# Patient Record
Sex: Female | Born: 1955
Health system: Southern US, Community
[De-identification: ages and names within clinical notes are randomized; demographics above are authoritative.]

## PROBLEM LIST (undated history)

## (undated) DIAGNOSIS — I10 Essential (primary) hypertension: Secondary | ICD-10-CM

## (undated) DIAGNOSIS — J45909 Unspecified asthma, uncomplicated: Secondary | ICD-10-CM

## (undated) DIAGNOSIS — I639 Cerebral infarction, unspecified: Secondary | ICD-10-CM

## (undated) HISTORY — PX: OOPHORECTOMY: SHX86

## (undated) HISTORY — DX: Cerebral infarction, unspecified: I63.9

---

## 2003-02-17 HISTORY — PX: ABDOMINAL HYSTERECTOMY: SHX81

## 2003-02-17 HISTORY — PX: TOTAL ABDOMINAL HYSTERECTOMY W/ BILATERAL SALPINGOOPHORECTOMY: SHX83

## 2006-09-20 ENCOUNTER — Emergency Department: Payer: Self-pay | Admitting: Emergency Medicine

## 2006-09-22 ENCOUNTER — Ambulatory Visit: Payer: Self-pay | Admitting: Urology

## 2006-09-24 ENCOUNTER — Ambulatory Visit: Payer: Self-pay | Admitting: Urology

## 2006-10-19 ENCOUNTER — Emergency Department: Payer: Self-pay | Admitting: Emergency Medicine

## 2007-05-02 ENCOUNTER — Ambulatory Visit: Payer: Self-pay | Admitting: Urology

## 2007-06-28 ENCOUNTER — Ambulatory Visit: Payer: Self-pay | Admitting: Family Medicine

## 2007-09-19 ENCOUNTER — Emergency Department: Payer: Self-pay | Admitting: Internal Medicine

## 2010-12-03 ENCOUNTER — Ambulatory Visit: Payer: Self-pay

## 2012-07-14 ENCOUNTER — Emergency Department: Payer: Self-pay | Admitting: Emergency Medicine

## 2012-07-14 LAB — URINALYSIS, COMPLETE
Bacteria: NONE SEEN
Bilirubin,UR: NEGATIVE
Glucose,UR: NEGATIVE mg/dL (ref 0–75)
Ketone: NEGATIVE
Leukocyte Esterase: NEGATIVE
Nitrite: NEGATIVE
Ph: 7 (ref 4.5–8.0)
RBC,UR: <1 /HPF (ref 0–5)
Squamous Epithelial: 5
WBC UR: <1 /HPF (ref 0–5)

## 2012-07-14 LAB — COMPREHENSIVE METABOLIC PANEL
BUN: 12 mg/dL (ref 7–18)
Bilirubin,Total: 0.3 mg/dL (ref 0.2–1.0)
Chloride: 108 mmol/L — ABNORMAL HIGH (ref 98–107)
Creatinine: 0.94 mg/dL (ref 0.60–1.30)
EGFR (African American): >60
EGFR (Non-African Amer.): >60
Glucose: 106 mg/dL — ABNORMAL HIGH (ref 65–99)
Osmolality: 283 (ref 275–301)
SGOT(AST): 27 U/L (ref 15–37)
Total Protein: 6.9 g/dL (ref 6.4–8.2)

## 2012-07-14 LAB — CBC
HCT: 39.8 % (ref 35.0–47.0)
HGB: 13.4 g/dL (ref 12.0–16.0)
Platelet: 305 10*3/uL (ref 150–440)
RBC: 4.65 10*6/uL (ref 3.80–5.20)
RDW: 13.5 % (ref 11.5–14.5)

## 2012-07-14 LAB — LIPASE, BLOOD: Lipase: 165 U/L (ref 73–393)

## 2012-07-21 ENCOUNTER — Ambulatory Visit: Payer: Self-pay | Admitting: Family Medicine

## 2012-11-01 ENCOUNTER — Ambulatory Visit: Payer: Self-pay | Admitting: Family Medicine

## 2013-10-18 DIAGNOSIS — G4733 Obstructive sleep apnea (adult) (pediatric): Secondary | ICD-10-CM | POA: Insufficient documentation

## 2013-11-28 ENCOUNTER — Ambulatory Visit: Payer: Self-pay | Admitting: Internal Medicine

## 2014-06-27 ENCOUNTER — Other Ambulatory Visit: Payer: Self-pay | Admitting: Family Medicine

## 2014-06-27 DIAGNOSIS — Z139 Encounter for screening, unspecified: Secondary | ICD-10-CM

## 2015-07-02 ENCOUNTER — Other Ambulatory Visit: Payer: Self-pay | Admitting: Family Medicine

## 2015-07-02 DIAGNOSIS — Z139 Encounter for screening, unspecified: Secondary | ICD-10-CM

## 2016-03-11 ENCOUNTER — Other Ambulatory Visit: Payer: Self-pay | Admitting: Nurse Practitioner

## 2016-03-11 DIAGNOSIS — M25511 Pain in right shoulder: Secondary | ICD-10-CM

## 2016-03-17 ENCOUNTER — Ambulatory Visit: Payer: Self-pay

## 2016-10-22 ENCOUNTER — Emergency Department: Payer: Worker's Compensation

## 2016-10-22 ENCOUNTER — Emergency Department
Admission: EM | Admit: 2016-10-22 | Discharge: 2016-10-22 | Disposition: A | Discharge status: Home / Self Care | Payer: Worker's Compensation | Attending: Emergency Medicine | Admitting: Emergency Medicine

## 2016-10-22 ENCOUNTER — Encounter: Payer: Self-pay | Admitting: Emergency Medicine

## 2016-10-22 DIAGNOSIS — J45909 Unspecified asthma, uncomplicated: Secondary | ICD-10-CM | POA: Diagnosis not present

## 2016-10-22 DIAGNOSIS — S8002XA Contusion of left knee, initial encounter: Secondary | ICD-10-CM | POA: Diagnosis not present

## 2016-10-22 DIAGNOSIS — Y929 Unspecified place or not applicable: Secondary | ICD-10-CM | POA: Insufficient documentation

## 2016-10-22 DIAGNOSIS — W010XXA Fall on same level from slipping, tripping and stumbling without subsequent striking against object, initial encounter: Secondary | ICD-10-CM | POA: Insufficient documentation

## 2016-10-22 DIAGNOSIS — I10 Essential (primary) hypertension: Secondary | ICD-10-CM | POA: Diagnosis not present

## 2016-10-22 DIAGNOSIS — S8992XA Unspecified injury of left lower leg, initial encounter: Secondary | ICD-10-CM | POA: Diagnosis present

## 2016-10-22 DIAGNOSIS — Y939 Activity, unspecified: Secondary | ICD-10-CM | POA: Diagnosis not present

## 2016-10-22 DIAGNOSIS — Y990 Civilian activity done for income or pay: Secondary | ICD-10-CM | POA: Diagnosis not present

## 2016-10-22 DIAGNOSIS — Z79899 Other long term (current) drug therapy: Secondary | ICD-10-CM | POA: Insufficient documentation

## 2016-10-22 DIAGNOSIS — S8392XA Sprain of unspecified site of left knee, initial encounter: Secondary | ICD-10-CM | POA: Diagnosis not present

## 2016-10-22 HISTORY — DX: Unspecified asthma, uncomplicated: J45.909

## 2016-10-22 HISTORY — DX: Essential (primary) hypertension: I10

## 2016-10-22 MED ORDER — IBUPROFEN 800 MG PO TABS: 800.0000 mg | ORAL_TABLET | Freq: Three times a day (TID) | ORAL | 0 refills | Status: DC | PRN
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
## 2016-10-22 NOTE — ED Notes (Signed)
Pt taken to xray via wheelchair
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
## 2016-10-22 NOTE — Discharge Instructions (Signed)
Take medication as prescribed. Return to emergency department if symptoms worsen and follow-up with PCP as needed.    Utilize crutches and knee immobilizer as needed until symptoms improve. He may put as much weight through the left leg as he fell comfortable.

## 2017-01-15 ENCOUNTER — Ambulatory Visit (INDEPENDENT_AMBULATORY_CARE_PROVIDER_SITE_OTHER): Payer: BLUE CROSS/BLUE SHIELD | Admitting: Unknown Physician Specialty

## 2017-01-15 ENCOUNTER — Encounter: Payer: Self-pay | Admitting: Unknown Physician Specialty

## 2017-01-15 VITALS — BP 130/80 | HR 87 | Temp 98.4°F | Ht 62.6 in | Wt 240.5 lb

## 2017-01-15 DIAGNOSIS — Z1231 Encounter for screening mammogram for malignant neoplasm of breast: Secondary | ICD-10-CM | POA: Diagnosis not present

## 2017-01-15 DIAGNOSIS — J4540 Moderate persistent asthma, uncomplicated: Secondary | ICD-10-CM | POA: Diagnosis not present

## 2017-01-15 DIAGNOSIS — Z7689 Persons encountering health services in other specified circumstances: Secondary | ICD-10-CM | POA: Diagnosis not present

## 2017-01-15 DIAGNOSIS — I10 Essential (primary) hypertension: Secondary | ICD-10-CM | POA: Insufficient documentation

## 2017-01-15 DIAGNOSIS — J45909 Unspecified asthma, uncomplicated: Secondary | ICD-10-CM | POA: Insufficient documentation

## 2017-01-15 DIAGNOSIS — Z1239 Encounter for other screening for malignant neoplasm of breast: Secondary | ICD-10-CM

## 2017-01-15 NOTE — Assessment & Plan Note (Signed)
Stable, continue present medications.   

## 2017-01-15 NOTE — Progress Notes (Signed)
BP 130/80   Pulse 87   Temp 98.4 F (36.9 C) (Oral)   Ht 5' 2.6" (1.59 m)   Wt 240 lb 8 oz (109.1 kg)   SpO2 98%   BMI 43.15 kg/m    Subjective:    Patient ID: Kristin Solis Garlick, female    DOB: [birth year], 61 y.o.   MRN: [id number]  HPI: Kristin Solis Po is a 61 y.o. female  Chief Complaint  Patient presents with  . Establish Care    no conerns today    Hypertension Using medications without difficulty Average home BPs ; typically below 140/90   No problems or lightheadedness No chest pain with exertion or shortness of breath No Edema  ASTHMA Symptoms are well controlled.  Rescue inhaler use is rare Using medications without problems: Night time symptoms: none Wheeze/SOB: none  Weight loss Is down from 262 on her scale at home and is now down to 240.  Started purposeful weight loss 5 months ago by cutting out sweet tea and all over sugar.  Limits her bread.  Eats lower portions  Social History   Socioeconomic History  . Marital status: Married    Spouse name: Not on file  . Number of children: Not on file  . Years of education: Not on file  . Highest education level: Not on file  Social Needs  . Financial resource strain: Not on file  . Food insecurity - worry: Not on file  . Food insecurity - inability: Not on file  . Transportation needs - medical: Not on file  . Transportation needs - non-medical: Not on file  Occupational History  . Not on file  Tobacco Use  . Smoking status: Never Smoker  . Smokeless tobacco: Never Used  Substance and Sexual Activity  . Alcohol use: No  . Drug use: No  . Sexual activity: No  Other Topics Concern  . Not on file  Social History Narrative  . Not on file   Family History  Problem Relation Age of Onset  . Cancer Mother        lung  . Cancer Father        brain  . Diabetes Sister   . Alcohol abuse Brother   . Stroke Son        x2  . CAD Maternal Grandmother   . Emphysema Paternal Grandfather   . Heart  disease Sister        CHF   Past Medical History:  Diagnosis Date  . Asthma   . Hypertension    Past Surgical History:  Procedure Laterality Date  . ABDOMINAL HYSTERECTOMY  2005   total    Relevant past medical, surgical, family and social history reviewed and updated as indicated. Interim medical history since our last visit reviewed. Allergies and medications reviewed and updated.  Review of Systems  Constitutional: Negative.   HENT: Negative.   Eyes: Negative.   Respiratory: Negative.   Cardiovascular: Negative.   Gastrointestinal: Negative.   Endocrine: Negative.   Genitourinary: Negative.   Musculoskeletal: Negative.   Skin: Negative.   Allergic/Immunologic: Negative.   Neurological: Negative.   Hematological: Negative.   Psychiatric/Behavioral: Negative.     Per HPI unless specifically indicated above     Objective:    BP 130/80   Pulse 87   Temp 98.4 F (36.9 C) (Oral)   Ht 5' 2.6" (1.59 m)   Wt 240 lb 8 oz (109.1 kg)   SpO2 98%  BMI 43.15 kg/m   Wt Readings from Last 3 Encounters:  01/15/17 240 lb 8 oz (109.1 kg)  10/22/16 245 lb (111.1 kg)    Physical Exam  Constitutional: She is oriented to person, place, and time. She appears well-developed and well-nourished. No distress.  HENT:  Head: Normocephalic and atraumatic.  Eyes: Conjunctivae and lids are normal. Right eye exhibits no discharge. Left eye exhibits no discharge. No scleral icterus.  Neck: Normal range of motion. Neck supple. No JVD present. Carotid bruit is not present.  Cardiovascular: Normal rate, regular rhythm and normal heart sounds.  Pulmonary/Chest: Effort normal and breath sounds normal.  Abdominal: Normal appearance. There is no splenomegaly or hepatomegaly.  Musculoskeletal: Normal range of motion.  Neurological: She is alert and oriented to person, place, and time.  Skin: Skin is warm, dry and intact. No rash noted. No pallor.  Psychiatric: She has a normal mood and  affect. Her behavior is normal. Judgment and thought content normal.    No results found for this or any previous visit.    Assessment & Plan:   Problem List Items Addressed This Visit      Unprioritized   Asthma    Stable, continue present medications.        Relevant Medications   ADVAIR DISKUS 250-50 MCG/DOSE AEPB   albuterol (PROVENTIL HFA;VENTOLIN HFA) 108 (90 Base) MCG/ACT inhaler   Encounter to establish care   Essential hypertension, benign    Stable, continue present medications.         Other Visit Diagnoses    Breast cancer screening    -  Primary   Relevant Orders   MM DIGITAL SCREENING BILATERAL      Follow up plan: Return for schedule for PE.

## 2017-01-15 NOTE — Patient Instructions (Signed)
Please do call to schedule your mammogram; the number to schedule one at either [facility] or [facility] is [phone]   

## 2017-01-26 ENCOUNTER — Encounter: Payer: Self-pay | Admitting: Unknown Physician Specialty

## 2017-02-12 ENCOUNTER — Ambulatory Visit
Admission: RE | Admit: 2017-02-12 | Discharge: 2017-02-12 | Disposition: A | Discharge status: Home / Self Care | Payer: BLUE CROSS/BLUE SHIELD | Source: Ambulatory Visit | Attending: Unknown Physician Specialty | Admitting: Unknown Physician Specialty

## 2017-02-12 DIAGNOSIS — Z1231 Encounter for screening mammogram for malignant neoplasm of breast: Secondary | ICD-10-CM | POA: Insufficient documentation

## 2017-02-12 DIAGNOSIS — Z1239 Encounter for other screening for malignant neoplasm of breast: Secondary | ICD-10-CM

## 2017-02-17 ENCOUNTER — Other Ambulatory Visit: Payer: Self-pay | Admitting: Unknown Physician Specialty

## 2017-02-17 DIAGNOSIS — R928 Other abnormal and inconclusive findings on diagnostic imaging of breast: Secondary | ICD-10-CM

## 2017-02-17 DIAGNOSIS — N6489 Other specified disorders of breast: Secondary | ICD-10-CM

## 2017-03-01 ENCOUNTER — Other Ambulatory Visit: Payer: Self-pay

## 2017-03-01 ENCOUNTER — Ambulatory Visit: Payer: No Typology Code available for payment source

## 2017-03-05 ENCOUNTER — Ambulatory Visit
Admission: RE | Admit: 2017-03-05 | Discharge: 2017-03-05 | Disposition: A | Discharge status: Home / Self Care | Payer: BLUE CROSS/BLUE SHIELD | Source: Ambulatory Visit | Attending: Unknown Physician Specialty | Admitting: Unknown Physician Specialty

## 2017-03-05 ENCOUNTER — Encounter: Payer: BLUE CROSS/BLUE SHIELD | Admitting: Unknown Physician Specialty

## 2017-03-05 DIAGNOSIS — N6489 Other specified disorders of breast: Secondary | ICD-10-CM | POA: Insufficient documentation

## 2017-03-05 DIAGNOSIS — N6001 Solitary cyst of right breast: Secondary | ICD-10-CM | POA: Diagnosis not present

## 2017-03-05 DIAGNOSIS — R928 Other abnormal and inconclusive findings on diagnostic imaging of breast: Secondary | ICD-10-CM

## 2017-03-26 ENCOUNTER — Encounter: Payer: Self-pay | Admitting: Unknown Physician Specialty

## 2017-03-26 ENCOUNTER — Ambulatory Visit (INDEPENDENT_AMBULATORY_CARE_PROVIDER_SITE_OTHER): Payer: BLUE CROSS/BLUE SHIELD | Admitting: Unknown Physician Specialty

## 2017-03-26 VITALS — BP 137/76 | HR 80 | Temp 98.5°F | Wt 235.8 lb

## 2017-03-26 DIAGNOSIS — J4540 Moderate persistent asthma, uncomplicated: Secondary | ICD-10-CM

## 2017-03-26 DIAGNOSIS — N6001 Solitary cyst of right breast: Secondary | ICD-10-CM

## 2017-03-26 DIAGNOSIS — Z0000 Encounter for general adult medical examination without abnormal findings: Secondary | ICD-10-CM

## 2017-03-26 DIAGNOSIS — I10 Essential (primary) hypertension: Secondary | ICD-10-CM | POA: Diagnosis not present

## 2017-03-26 NOTE — Patient Instructions (Signed)
Call mammography in may  Please do call to schedule your mammogram; the number to schedule one at either [facility] or [facility] is [phone]

## 2017-03-26 NOTE — Progress Notes (Signed)
BP 137/76   Pulse 80   Temp 98.5 F (36.9 C) (Oral)   Wt 235 lb 12.8 oz (107 kg)   SpO2 98%   BMI 42.31 kg/m    Subjective:    Patient ID: Kristin Solis, female    DOB: March 14, 1955, 62 y.o.   MRN: [id number]  HPI: Kristin Solis is a 62 y.o. female  Chief Complaint  Patient presents with  . Annual Exam   ASTHMA Triggers: None Symptoms are well controlled Using medications without problems Night time symptoms: None Wheeze/SOB:None Takes inhaler rarely  Obesity She has lost 5 pounds since last seen and cut back on bread.  Still drinking green tea.    Hypertension Using medications without difficulty Average home BPs: Not checking   No problems or lightheadedness No chest pain with exertion or shortness of breath No Edema   Social History   Socioeconomic History  . Marital status: Married    Spouse name: Not on file  . Number of children: Not on file  . Years of education: Not on file  . Highest education level: Not on file  Social Needs  . Financial resource strain: Not on file  . Food insecurity - worry: Not on file  . Food insecurity - inability: Not on file  . Transportation needs - medical: Not on file  . Transportation needs - non-medical: Not on file  Occupational History  . Not on file  Tobacco Use  . Smoking status: Never Smoker  . Smokeless tobacco: Never Used  Substance and Sexual Activity  . Alcohol use: No  . Drug use: No  . Sexual activity: No  Other Topics Concern  . Not on file  Social History Narrative  . Not on file   Family History  Problem Relation Age of Onset  . Cancer Mother        lung  . Cancer Father        brain  . Diabetes Sister   . Alcohol abuse Brother   . Stroke Son        x2  . CAD Maternal Grandmother   . Emphysema Paternal Grandfather   . Heart disease Sister        CHF   Past Medical History:  Diagnosis Date  . Asthma   . Hypertension    Past Surgical History:  Procedure Laterality Date  .  ABDOMINAL HYSTERECTOMY  2005   total   Relevant past medical, surgical, family and social history reviewed and updated as indicated. Interim medical history since our last visit reviewed. Allergies and medications reviewed and updated.  Review of Systems  Constitutional: Negative.   HENT: Negative.   Eyes: Negative.   Respiratory: Negative.   Cardiovascular: Negative.   Gastrointestinal: Negative.   Endocrine: Negative.   Genitourinary: Negative.   Musculoskeletal: Negative.   Skin: Negative.   Allergic/Immunologic: Negative.   Neurological: Negative.   Hematological: Negative.   Psychiatric/Behavioral: Negative.     Per HPI unless specifically indicated above     Objective:    BP 137/76   Pulse 80   Temp 98.5 F (36.9 C) (Oral)   Wt 235 lb 12.8 oz (107 kg)   SpO2 98%   BMI 42.31 kg/m   Wt Readings from Last 3 Encounters:  03/26/17 235 lb 12.8 oz (107 kg)  01/15/17 240 lb 8 oz (109.1 kg)  10/22/16 245 lb (111.1 kg)    Physical Exam  Constitutional: She is oriented to person, place,  and time. She appears well-developed and well-nourished.  HENT:  Head: Normocephalic and atraumatic.  Eyes: Pupils are equal, round, and reactive to light. Right eye exhibits no discharge. Left eye exhibits no discharge. No scleral icterus.  Neck: Normal range of motion. Neck supple. Carotid bruit is not present. No thyromegaly present.  Cardiovascular: Normal rate, regular rhythm and normal heart sounds. Exam reveals no gallop and no friction rub.  No murmur heard. Pulmonary/Chest: Effort normal and breath sounds normal. No respiratory distress. She has no wheezes. She has no rales.  Abdominal: Soft. Bowel sounds are normal. There is no tenderness. There is no rebound.  Genitourinary: No breast swelling, tenderness or discharge.  Musculoskeletal: Normal range of motion.  Lymphadenopathy:    She has no cervical adenopathy.  Neurological: She is alert and oriented to person, place, and  time.  Skin: Skin is warm, dry and intact. No rash noted.  Psychiatric: She has a normal mood and affect. Her speech is normal and behavior is normal. Judgment and thought content normal. Cognition and memory are normal.    No results found for this or any previous visit.    Assessment & Plan:   Problem List Items Addressed This Visit      Unprioritized   Asthma    Stable, continue present medications.        Essential hypertension, benign    Stable, continue present medications.         Other Visit Diagnoses    Breast cyst, right    -  Primary   Relevant Orders   MM Digital Diagnostic Unilat R   US BREAST COMPLETE UNI RIGHT INC AXILLA   Annual physical exam       Relevant Orders   CBC with Differential/Platelet   Comprehensive metabolic panel   TSH   Lipid Panel w/o Chol/HDL Ratio      Refusing Colonoscopy or Cologuard, HIV, Hep C  Follow up plan: Return in about 6 months (around 09/23/2017).

## 2017-03-26 NOTE — Assessment & Plan Note (Signed)
Stable, continue present medications.   

## 2017-03-27 LAB — LIPID PANEL W/O CHOL/HDL RATIO
CHOLESTEROL TOTAL: 196 mg/dL (ref 100–199)
HDL: 73 mg/dL (ref >39)
LDL CALC: 107 mg/dL — AB (ref 0–99)
TRIGLYCERIDES: 80 mg/dL (ref 0–149)
VLDL CHOLESTEROL CAL: 16 mg/dL (ref 5–40)

## 2017-03-27 LAB — CBC WITH DIFFERENTIAL/PLATELET
BASOS: 0 %
Basophils Absolute: 0 10*3/uL (ref 0.0–0.2)
EOS (ABSOLUTE): 0.2 10*3/uL (ref 0.0–0.4)
EOS: 3 %
HEMATOCRIT: 42.3 % (ref 34.0–46.6)
Hemoglobin: 13.6 g/dL (ref 11.1–15.9)
IMMATURE GRANS (ABS): 0 10*3/uL (ref 0.0–0.1)
IMMATURE GRANULOCYTES: 0 %
LYMPHS: 18 %
Lymphocytes Absolute: 1.1 10*3/uL (ref 0.7–3.1)
MCH: 28.4 pg (ref 26.6–33.0)
MCHC: 32.2 g/dL (ref 31.5–35.7)
MCV: 88 fL (ref 79–97)
MONOCYTES: 9 %
MONOS ABS: 0.6 10*3/uL (ref 0.1–0.9)
NEUTROS PCT: 70 %
Neutrophils Absolute: 4.5 10*3/uL (ref 1.4–7.0)
Platelets: 312 10*3/uL (ref 150–379)
RBC: 4.79 x10E6/uL (ref 3.77–5.28)
RDW: 13.9 % (ref 12.3–15.4)
WBC: 6.4 10*3/uL (ref 3.4–10.8)

## 2017-03-27 LAB — COMPREHENSIVE METABOLIC PANEL
A/G RATIO: 1.6 (ref 1.2–2.2)
ALT: 22 IU/L (ref 0–32)
AST: 19 IU/L (ref 0–40)
Albumin: 4.1 g/dL (ref 3.6–4.8)
Alkaline Phosphatase: 95 IU/L (ref 39–117)
BUN/Creatinine Ratio: 13 (ref 12–28)
BUN: 16 mg/dL (ref 8–27)
Bilirubin Total: 0.4 mg/dL (ref 0.0–1.2)
CALCIUM: 9.6 mg/dL (ref 8.7–10.3)
CO2: 26 mmol/L (ref 20–29)
CREATININE: 1.21 mg/dL — AB (ref 0.57–1.00)
Chloride: 103 mmol/L (ref 96–106)
GFR, EST AFRICAN AMERICAN: 56 mL/min/{1.73_m2} — AB (ref >59)
GFR, EST NON AFRICAN AMERICAN: 49 mL/min/{1.73_m2} — AB (ref >59)
GLOBULIN, TOTAL: 2.6 g/dL (ref 1.5–4.5)
Glucose: 87 mg/dL (ref 65–99)
POTASSIUM: 3.6 mmol/L (ref 3.5–5.2)
SODIUM: 142 mmol/L (ref 134–144)
TOTAL PROTEIN: 6.7 g/dL (ref 6.0–8.5)

## 2017-03-27 LAB — TSH: TSH: 1.05 u[IU]/mL (ref 0.450–4.500)

## 2017-03-29 ENCOUNTER — Telehealth: Payer: Self-pay | Admitting: Unknown Physician Specialty

## 2017-03-29 DIAGNOSIS — N289 Disorder of kidney and ureter, unspecified: Secondary | ICD-10-CM

## 2017-03-29 NOTE — Telephone Encounter (Signed)
Discussed with pt low GFR.  Encouraged to recheck this week after drinking a lot of fluids

## 2017-04-01 ENCOUNTER — Other Ambulatory Visit: Payer: BLUE CROSS/BLUE SHIELD

## 2017-04-01 DIAGNOSIS — N289 Disorder of kidney and ureter, unspecified: Secondary | ICD-10-CM

## 2017-04-02 LAB — COMPREHENSIVE METABOLIC PANEL
A/G RATIO: 2 (ref 1.2–2.2)
ALT: 19 IU/L (ref 0–32)
AST: 16 IU/L (ref 0–40)
Albumin: 3.9 g/dL (ref 3.6–4.8)
Alkaline Phosphatase: 82 IU/L (ref 39–117)
BILIRUBIN TOTAL: 0.3 mg/dL (ref 0.0–1.2)
BUN/Creatinine Ratio: 13 (ref 12–28)
BUN: 13 mg/dL (ref 8–27)
CALCIUM: 9.3 mg/dL (ref 8.7–10.3)
CHLORIDE: 102 mmol/L (ref 96–106)
CO2: 24 mmol/L (ref 20–29)
Creatinine, Ser: 1.01 mg/dL — ABNORMAL HIGH (ref 0.57–1.00)
GFR, EST AFRICAN AMERICAN: 70 mL/min/{1.73_m2} (ref >59)
GFR, EST NON AFRICAN AMERICAN: 61 mL/min/{1.73_m2} (ref >59)
GLOBULIN, TOTAL: 2 g/dL (ref 1.5–4.5)
Glucose: 145 mg/dL — ABNORMAL HIGH (ref 65–99)
POTASSIUM: 3.8 mmol/L (ref 3.5–5.2)
SODIUM: 140 mmol/L (ref 134–144)
TOTAL PROTEIN: 5.9 g/dL — AB (ref 6.0–8.5)

## 2017-04-02 NOTE — Progress Notes (Signed)
Notified pt by mychart

## 2017-05-31 ENCOUNTER — Encounter: Payer: Self-pay | Admitting: Family Medicine

## 2017-05-31 ENCOUNTER — Ambulatory Visit (INDEPENDENT_AMBULATORY_CARE_PROVIDER_SITE_OTHER): Payer: BLUE CROSS/BLUE SHIELD | Admitting: Family Medicine

## 2017-05-31 VITALS — BP 108/75 | HR 77 | Temp 98.5°F | Ht 65.0 in | Wt 234.0 lb

## 2017-05-31 DIAGNOSIS — J4541 Moderate persistent asthma with (acute) exacerbation: Secondary | ICD-10-CM

## 2017-05-31 MED ORDER — HYDROCOD POLST-CPM POLST ER 10-8 MG/5ML PO SUER: 5.0000 mL | Freq: Every evening | ORAL | 0 refills | Status: DC | PRN

## 2017-05-31 MED ORDER — BENZONATATE 200 MG PO CAPS: 200.0000 mg | ORAL_CAPSULE | Freq: Two times a day (BID) | ORAL | 0 refills | Status: DC | PRN

## 2017-05-31 MED ORDER — ALBUTEROL SULFATE (2.5 MG/3ML) 0.083% IN NEBU
2.5000 mg | INHALATION_SOLUTION | Freq: Once | RESPIRATORY_TRACT | Status: AC
  Administered 2017-05-31: 2.5 mg via RESPIRATORY_TRACT

## 2017-05-31 NOTE — Assessment & Plan Note (Addendum)
In acute exacerbation, mild. Will treat with tessalon perles, tussionex and increased albuterol. Call if not getting better or getting worse.

## 2017-05-31 NOTE — Progress Notes (Signed)
BP 108/75   Pulse 77   Temp 98.5 F (36.9 C) (Oral)   Ht 5\' 5"  (1.651 m)   Wt 234 lb (106.1 kg)   SpO2 97%   BMI 38.94 kg/m    Subjective:    Patient ID: Kristin Solis, female    DOB: 1955-06-18, 62 y.o.   MRN: [id number]  HPI: Kristin Solis is a 62 y.o. female  Chief Complaint  Patient presents with  . URI    Cough, semi productive; scratchy throat; ear pain - since Saturday. Denies fevers   UPPER RESPIRATORY TRACT INFECTION Duration: 2 days Worst symptom: cough and congestion Fever: no Cough: yes Shortness of breath: no Wheezing: yes Chest pain: yes, with cough Chest tightness: no Chest congestion: no Nasal congestion: yes Runny nose: no Post nasal drip: yes Sneezing: yes Sore throat: yes Swollen glands: no Sinus pressure: yes Headache: yes Face pain: no Toothache: no Ear pain: yes bilateral Ear pressure: yes bilateral Eyes red/itching:no Eye drainage/crusting: no  Vomiting: no Rash: no Fatigue: yes Sick contacts: no Strep contacts: no  Context: worse Recurrent sinusitis: no Relief with OTC cold/cough medications: no  Treatments attempted: none   Relevant past medical, surgical, family and social history reviewed and updated as indicated. Interim medical history since our last visit reviewed. Allergies and medications reviewed and updated.  Review of Systems  Constitutional: Positive for fatigue. Negative for activity change, appetite change, chills, diaphoresis, fever and unexpected weight change.  HENT: Positive for congestion, postnasal drip, rhinorrhea, sinus pressure, sneezing and sore throat. Negative for dental problem, drooling, ear discharge, ear pain, facial swelling, hearing loss, mouth sores, nosebleeds, sinus pain, tinnitus, trouble swallowing and voice change.   Eyes: Negative.   Respiratory: Positive for cough, chest tightness and wheezing. Negative for apnea, choking, shortness of breath and stridor.   Cardiovascular: Negative.    Gastrointestinal: Negative.   Psychiatric/Behavioral: Negative.     Per HPI unless specifically indicated above     Objective:    BP 108/75   Pulse 77   Temp 98.5 F (36.9 C) (Oral)   Ht 5\' 5"  (1.651 m)   Wt 234 lb (106.1 kg)   SpO2 97%   BMI 38.94 kg/m   Wt Readings from Last 3 Encounters:  05/31/17 234 lb (106.1 kg)  03/26/17 235 lb 12.8 oz (107 kg)  01/15/17 240 lb 8 oz (109.1 kg)    Physical Exam  Constitutional: She is oriented to person, place, and time. She appears well-developed and well-nourished. No distress.  HENT:  Head: Normocephalic and atraumatic.  Right Ear: Hearing and external ear normal.  Left Ear: Hearing and external ear normal.  Nose: Nose normal.  Mouth/Throat: Oropharynx is clear and moist. No oropharyngeal exudate.  Eyes: Pupils are equal, round, and reactive to light. Conjunctivae, EOM and lids are normal. Right eye exhibits no discharge. Left eye exhibits no discharge. No scleral icterus.  Neck: Normal range of motion. Neck supple. No JVD present. No tracheal deviation present. No thyromegaly present.  Cardiovascular: Normal rate, regular rhythm, normal heart sounds and intact distal pulses. Exam reveals no gallop and no friction rub.  No murmur heard. Pulmonary/Chest: Effort normal. No stridor. No respiratory distress. She has no wheezes. She has no rales. She exhibits no tenderness.  Decreased breath sounds throughout  Musculoskeletal: Normal range of motion.  Lymphadenopathy:    She has no cervical adenopathy.  Neurological: She is alert and oriented to person, place, and time.  Skin: Skin is  warm, dry and intact. Capillary refill takes less than 2 seconds. No rash noted. She is not diaphoretic. No erythema. No pallor.  Psychiatric: She has a normal mood and affect. Her speech is normal and behavior is normal. Judgment and thought content normal. Cognition and memory are normal.    Results for orders placed or performed in visit on 04/01/17   Comprehensive metabolic panel  Result Value Ref Range   Glucose 145 (H) 65 - 99 mg/dL   BUN 13 8 - 27 mg/dL   Creatinine, Ser 1.611.01 (H) 0.57 - 1.00 mg/dL   GFR calc non Af Amer 61 >59 mL/min/1.73   GFR calc Af Amer 70 >59 mL/min/1.73   BUN/Creatinine Ratio 13 12 - 28   Sodium 140 134 - 144 mmol/L   Potassium 3.8 3.5 - 5.2 mmol/L   Chloride 102 96 - 106 mmol/L   CO2 24 20 - 29 mmol/L   Calcium 9.3 8.7 - 10.3 mg/dL   Total Protein 5.9 (L) 6.0 - 8.5 g/dL   Albumin 3.9 3.6 - 4.8 g/dL   Globulin, Total 2.0 1.5 - 4.5 g/dL   Albumin/Globulin Ratio 2.0 1.2 - 2.2   Bilirubin Total 0.3 0.0 - 1.2 mg/dL   Alkaline Phosphatase 82 39 - 117 IU/L   AST 16 0 - 40 IU/L   ALT 19 0 - 32 IU/L      Assessment & Plan:   Problem List Items Addressed This Visit      Respiratory   Asthma - Primary    In acute exacerbation, mild. Will treat with tessalon perles, tussionex and increased albuterol. Call if not getting better or getting worse.       Relevant Medications   albuterol (PROVENTIL) (2.5 MG/3ML) 0.083% nebulizer solution 2.5 mg       Follow up plan: Return if symptoms worsen or fail to improve.

## 2017-06-11 ENCOUNTER — Other Ambulatory Visit: Payer: Self-pay | Admitting: Unknown Physician Specialty

## 2017-06-11 NOTE — Telephone Encounter (Signed)
Copied from CRM [id number]. Topic: Quick Communication - Rx Refill/Question >> Jun 11, 2017  8:26 AM Cipriano BunkerLambe, Annette S wrote: Medication:  lisinopril-hydrochlorothiazide (PRINZIDE,ZESTORETIC) 10-12.5 MG tablet montelukast (SINGULAIR) 10 MG tablet fluticasone (FLONASE) 50 MCG/ACT nasal spray ADVAIR DISKUS 250-50 MCG/DOSE AEPB  All need to be 90 day supply  Has the patient contacted their pharmacy? No. (Agent: If no, request that the patient contact the pharmacy for the refill.) Preferred Pharmacy (with phone number or street name):   [facility] [place] - [id number] AT [facility] [address] [place] [id number] Phone: [phone] Fax: [phone]   Agent: Please be advised that RX refills may take up to 3 business days. We ask that you follow-up with your pharmacy.

## 2017-06-12 NOTE — Telephone Encounter (Addendum)
Patient requesting 90 day refills on the following:  Lisinipril, singulair, flonase, advair refill Last OV: 01/15/17 Last Refill:10/22/16 by historical provider Pharmacy: [facility] - [place], [place] - [address] AT Portal to [facility]istered [facility] Sites [phone] (Phone) [phone] (Fax)

## 2017-06-14 MED ORDER — MONTELUKAST SODIUM 10 MG PO TABS: 10.0000 mg | ORAL_TABLET | Freq: Every day | ORAL | 1 refills | Status: DC

## 2017-06-14 MED ORDER — FLUTICASONE PROPIONATE 50 MCG/ACT NA SUSP: 2.0000 | Freq: Every day | NASAL | 12 refills | Status: DC

## 2017-06-14 MED ORDER — LISINOPRIL-HYDROCHLOROTHIAZIDE 10-12.5 MG PO TABS: 1.0000 | ORAL_TABLET | Freq: Every day | ORAL | 1 refills | Status: DC

## 2017-06-14 MED ORDER — ADVAIR DISKUS 250-50 MCG/DOSE IN AEPB: INHALATION_SPRAY | RESPIRATORY_TRACT | 1 refills | Status: DC

## 2017-06-21 ENCOUNTER — Telehealth: Payer: Self-pay | Admitting: Unknown Physician Specialty

## 2017-06-21 MED ORDER — FLUTICASONE-SALMETEROL 250-50 MCG/DOSE IN AEPB: 1.0000 | INHALATION_SPRAY | Freq: Two times a day (BID) | RESPIRATORY_TRACT | 3 refills | Status: DC

## 2017-06-21 NOTE — Telephone Encounter (Signed)
Copied from CRM [id number]. Topic: Quick Communication - Rx Refill/Question >> Jun 11, 2017  8:26 AM Cipriano Bunker wrote: Medication: lisinopril-hydrochlorothiazide (PRINZIDE,ZESTORETIC) 10-12.5 MG tablet montelukast (SINGULAIR) 10 MG tablet fluticasone (FLONASE) 50 MCG/ACT nasal spray ADVAIR DISKUS 250-50 MCG/DOSE AEPB  Has the patient contacted their pharmacy? No. (Agent: If no, request that the patient contact the pharmacy for the refill.) Preferred Pharmacy (with phone number or street name):   [facility] - [place], [place] - [address] AT Portal to Registered [facility] Sites [address] [place] [address] Phone: [phone] Fax: [phone]   Agent: Please be advised that RX refills may take up to 3 business days. We ask that you follow-up with your pharmacy. >> Jun 11, 2017  8:31 AM Cipriano Bunker wrote: Pt. Is out of Advair and needs called in today so can get sent >> Jun 21, 2017  9:51 AM Floria Raveling A wrote: Pt called in and wanted to know why her advair was not called in for 90 day supply?   Pt has already rec it via mail order.

## 2017-06-22 ENCOUNTER — Telehealth: Payer: Self-pay | Admitting: Unknown Physician Specialty

## 2017-06-22 NOTE — Telephone Encounter (Signed)
Discussed with pt.  Will try the generic (I didn't know one was available).and will change to branded if needed.

## 2017-06-22 NOTE — Telephone Encounter (Signed)
Copied from CRM [id number]. Topic: Quick Communication - See Telephone Encounter >> Jun 22, 2017  9:15 AM Louie Bun, Rosey Bath D wrote: CRM for notification. See Telephone encounter for: 06/22/17. Patient called and said that she wants to talk to [facility] or her CMA about the change they did with her medication Fluticasone-Salmeterol (ADVAIR DISKUS) 250-50 MCG/DOSE AEPB. She wants to know why it was change without someone telling her. Please call patient back, thanks.

## 2017-06-22 NOTE — Telephone Encounter (Signed)
Called and spoke to patient. She states that a 30 day supply of her advair was sent in and she wants to know why it was not a 90 day supply. Explained and apologized to patient and let her know that [facility] sent in a 90 day RX yesterday. Patient states that the generic for advair was sent in and patient wants to know why. She states that she has never had a problem with advair for the 20 years she has been on it. Patient states that she is upset that she was not told about the change with the medication. Please advise.

## 2017-07-07 ENCOUNTER — Telehealth: Payer: Self-pay | Admitting: Unknown Physician Specialty

## 2017-07-07 MED ORDER — FLUTICASONE PROPIONATE 50 MCG/ACT NA SUSP: 2.0000 | Freq: Every day | NASAL | 3 refills | Status: DC

## 2017-07-07 NOTE — Telephone Encounter (Signed)
Copied from CRM [id number]. Topic: Quick Communication - See Telephone Encounter >> Jul 07, 2017  9:49 AM Jolayne Haines L wrote: CRM for notification. See Telephone encounter for: 07/07/17.  [facility] to let her know they were getting ready to mail her fluticasone (FLONASE) 50 MCG/ACT nasal spray. They told her it was only going to be for 30 days. She said that it is suppose to be for 90 days. She needs it re-sent for 90 days. Please advise.

## 2017-07-26 ENCOUNTER — Other Ambulatory Visit: Payer: Self-pay | Admitting: Unknown Physician Specialty

## 2017-07-26 NOTE — Telephone Encounter (Signed)
Copied from CRM [id number]. Topic: Quick Communication - Rx Refill/Question >> Jul 26, 2017  1:53 PM Maia Pettiesrtiz, Kristie S wrote: Medication: albuterol/proair HFA - pt states she has the red one - she only has 66 puffs left on her inhaler - requesting 90 day supply  Has the patient contacted their pharmacy? No - Elnita MaxwellCheryl has not prescribed before Preferred Pharmacy (with phone number or street name): [facility] [place], [place] - [id number] AT Portal to [employer] [phone] (Phone) [phone] (Fax)

## 2017-07-26 NOTE — Telephone Encounter (Signed)
Albuterol/proair HFA - pt states she has the red one - she only has 66 puffs left on her inhaler - requesting 90 day supply   Last OV:05/31/17 Last refill:01/15/17 by historical provider ZOX:[id number]:[place] [facility] [place] [place], [place] - [id number] AT Portal to [facility] [employer] [phone] (Phone) [phone] (Fax)

## 2017-07-27 MED ORDER — ALBUTEROL SULFATE HFA 108 (90 BASE) MCG/ACT IN AERS: 2.0000 | INHALATION_SPRAY | Freq: Four times a day (QID) | RESPIRATORY_TRACT | 0 refills | Status: DC | PRN

## 2017-08-11 ENCOUNTER — Other Ambulatory Visit: Payer: Self-pay

## 2017-08-25 ENCOUNTER — Ambulatory Visit
Admission: RE | Admit: 2017-08-25 | Discharge: 2017-08-25 | Disposition: A | Discharge status: Home / Self Care | Payer: BLUE CROSS/BLUE SHIELD | Source: Ambulatory Visit | Attending: Unknown Physician Specialty | Admitting: Unknown Physician Specialty

## 2017-08-25 DIAGNOSIS — N6001 Solitary cyst of right breast: Secondary | ICD-10-CM

## 2017-08-31 ENCOUNTER — Encounter: Payer: Self-pay | Admitting: Unknown Physician Specialty

## 2017-09-22 ENCOUNTER — Ambulatory Visit (INDEPENDENT_AMBULATORY_CARE_PROVIDER_SITE_OTHER): Payer: BLUE CROSS/BLUE SHIELD | Admitting: Physician Assistant

## 2017-09-22 ENCOUNTER — Encounter: Payer: Self-pay | Admitting: Physician Assistant

## 2017-09-22 ENCOUNTER — Ambulatory Visit: Payer: BLUE CROSS/BLUE SHIELD | Admitting: Unknown Physician Specialty

## 2017-09-22 ENCOUNTER — Other Ambulatory Visit: Payer: Self-pay

## 2017-09-22 VITALS — BP 115/64 | HR 91 | Temp 98.0°F | Ht 65.0 in | Wt 234.0 lb

## 2017-09-22 DIAGNOSIS — R7309 Other abnormal glucose: Secondary | ICD-10-CM | POA: Diagnosis not present

## 2017-09-22 DIAGNOSIS — J4541 Moderate persistent asthma with (acute) exacerbation: Secondary | ICD-10-CM

## 2017-09-22 DIAGNOSIS — I10 Essential (primary) hypertension: Secondary | ICD-10-CM

## 2017-09-22 NOTE — Progress Notes (Signed)
Subjective:    Patient ID: Kristin Solis, female    DOB: [birth year], 62 y.o.   MRN: [id number]  Kristin Solis is a 62 y.o. female presenting on 09/22/2017 for Hypertension   HPI   HTN: Currently taking Prinzide 10-12.5 mg daily. Tolerating well.   BP Readings from Last 3 Encounters:  09/22/17 115/64  05/31/17 108/75  03/26/17 137/76   Asthma: Takes advair daily, albuterol as needed. Also takes singulair.    Social History   Tobacco Use  . Smoking status: Never Smoker  . Smokeless tobacco: Never Used  Substance Use Topics  . Alcohol use: No  . Drug use: No   The 10-year ASCVD risk score Mikey Bussing DC Jr., et al., 2013) is: 3.3%   Values used to calculate the score:     Age: 6 years     Sex: Female     Is Non-Hispanic African American: No     Diabetic: No     Tobacco smoker: No     Systolic Blood Pressure: 601 mmHg     Is BP treated: Yes     HDL Cholesterol: 73 mg/dL     Total Cholesterol: 196 mg/dL  Review of Systems Per HPI unless specifically indicated above     Objective:    BP 115/64   Pulse 91   Temp 98 F (36.7 C) (Oral)   Ht 5' 5" (1.651 m)   Wt 234 lb (106.1 kg)   SpO2 96%   BMI 38.94 kg/m   Wt Readings from Last 3 Encounters:  09/22/17 234 lb (106.1 kg)  05/31/17 234 lb (106.1 kg)  03/26/17 235 lb 12.8 oz (107 kg)    Physical Exam  Constitutional: She is oriented to person, place, and time. She appears well-developed and well-nourished.  Cardiovascular: Normal rate and regular rhythm.  Pulmonary/Chest: Effort normal and breath sounds normal.  Abdominal: Soft. Bowel sounds are normal.  Neurological: She is alert and oriented to person, place, and time.  Skin: Skin is warm and dry.  Psychiatric: She has a normal mood and affect. Her behavior is normal.   Results for orders placed or performed in visit on 09/22/17  Comp Met (CMET)  Result Value Ref Range   Glucose 115 (H) 65 - 99 mg/dL   BUN 13 8 - 27 mg/dL   Creatinine, Ser 0.97 0.57 -  1.00 mg/dL   GFR calc non Af Amer 63 >59 mL/min/1.73   GFR calc Af Amer 73 >59 mL/min/1.73   BUN/Creatinine Ratio 13 12 - 28   Sodium 142 134 - 144 mmol/L   Potassium 3.7 3.5 - 5.2 mmol/L   Chloride 103 96 - 106 mmol/L   CO2 25 20 - 29 mmol/L   Calcium 9.6 8.7 - 10.3 mg/dL   Total Protein 6.2 6.0 - 8.5 g/dL   Albumin 4.0 3.6 - 4.8 g/dL   Globulin, Total 2.2 1.5 - 4.5 g/dL   Albumin/Globulin Ratio 1.8 1.2 - 2.2   Bilirubin Total 0.3 0.0 - 1.2 mg/dL   Alkaline Phosphatase 98 39 - 117 IU/L   AST 14 0 - 40 IU/L   ALT 20 0 - 32 IU/L  HgB A1c  Result Value Ref Range   Hgb A1c MFr Bld 5.6 4.8 - 5.6 %   Est. average glucose Bld gHb Est-mCnc 114 mg/dL      Assessment & Plan:  1. Essential hypertension, benign  Well controlled, continue Prinzide 10-12.5 mg daily.   - Comp Met (  CMET)  2. Elevated glucose  - HgB A1c  3. Asthma  Continue Advair and albuterol.     Follow up plan: Return in about 6 months (around 03/25/2018) for htn.  Carles Collet, PA-C [facility] 09/23/2017, 1:41 PM

## 2017-09-23 LAB — COMPREHENSIVE METABOLIC PANEL
ALT: 20 IU/L (ref 0–32)
AST: 14 IU/L (ref 0–40)
Albumin/Globulin Ratio: 1.8 (ref 1.2–2.2)
Albumin: 4 g/dL (ref 3.6–4.8)
Alkaline Phosphatase: 98 IU/L (ref 39–117)
BUN/Creatinine Ratio: 13 (ref 12–28)
BUN: 13 mg/dL (ref 8–27)
Bilirubin Total: 0.3 mg/dL (ref 0.0–1.2)
CO2: 25 mmol/L (ref 20–29)
Calcium: 9.6 mg/dL (ref 8.7–10.3)
Chloride: 103 mmol/L (ref 96–106)
Creatinine, Ser: 0.97 mg/dL (ref 0.57–1.00)
GFR calc Af Amer: 73 mL/min/{1.73_m2} (ref >59)
GFR calc non Af Amer: 63 mL/min/{1.73_m2} (ref >59)
Globulin, Total: 2.2 g/dL (ref 1.5–4.5)
Glucose: 115 mg/dL — ABNORMAL HIGH (ref 65–99)
Potassium: 3.7 mmol/L (ref 3.5–5.2)
Sodium: 142 mmol/L (ref 134–144)
Total Protein: 6.2 g/dL (ref 6.0–8.5)

## 2017-09-23 LAB — HEMOGLOBIN A1C
Est. average glucose Bld gHb Est-mCnc: 114 mg/dL
Hgb A1c MFr Bld: 5.6 % (ref 4.8–5.6)

## 2017-09-23 NOTE — Patient Instructions (Signed)

## 2017-09-24 ENCOUNTER — Ambulatory Visit: Payer: BLUE CROSS/BLUE SHIELD | Admitting: Unknown Physician Specialty

## 2018-01-07 ENCOUNTER — Other Ambulatory Visit: Payer: Self-pay | Admitting: Unknown Physician Specialty

## 2018-01-07 NOTE — Telephone Encounter (Signed)
OV scheduled 03/30/18

## 2018-03-28 ENCOUNTER — Encounter: Payer: Self-pay | Admitting: Nurse Practitioner

## 2018-03-28 DIAGNOSIS — R7301 Impaired fasting glucose: Secondary | ICD-10-CM | POA: Insufficient documentation

## 2018-03-30 ENCOUNTER — Encounter: Payer: Self-pay | Admitting: Family Medicine

## 2018-03-30 ENCOUNTER — Ambulatory Visit (INDEPENDENT_AMBULATORY_CARE_PROVIDER_SITE_OTHER): Payer: BLUE CROSS/BLUE SHIELD | Admitting: Family Medicine

## 2018-03-30 ENCOUNTER — Ambulatory Visit: Payer: BLUE CROSS/BLUE SHIELD | Admitting: Nurse Practitioner

## 2018-03-30 VITALS — BP 128/78 | HR 69 | Temp 98.1°F | Ht 65.0 in | Wt 234.0 lb

## 2018-03-30 DIAGNOSIS — R7301 Impaired fasting glucose: Secondary | ICD-10-CM

## 2018-03-30 DIAGNOSIS — I10 Essential (primary) hypertension: Secondary | ICD-10-CM | POA: Diagnosis not present

## 2018-03-30 DIAGNOSIS — J4541 Moderate persistent asthma with (acute) exacerbation: Secondary | ICD-10-CM | POA: Diagnosis not present

## 2018-03-30 MED ORDER — ALBUTEROL SULFATE HFA 108 (90 BASE) MCG/ACT IN AERS: 2.0000 | INHALATION_SPRAY | Freq: Four times a day (QID) | RESPIRATORY_TRACT | 3 refills | Status: DC | PRN

## 2018-03-30 MED ORDER — MONTELUKAST SODIUM 10 MG PO TABS: 10.0000 mg | ORAL_TABLET | Freq: Every day | ORAL | 1 refills | Status: DC

## 2018-03-30 MED ORDER — FLUTICASONE PROPIONATE 50 MCG/ACT NA SUSP: 2.0000 | Freq: Every day | NASAL | 3 refills | Status: DC

## 2018-03-30 MED ORDER — FLUTICASONE-SALMETEROL 250-50 MCG/DOSE IN AEPB: 1.0000 | INHALATION_SPRAY | Freq: Two times a day (BID) | RESPIRATORY_TRACT | 3 refills | Status: DC

## 2018-03-30 MED ORDER — LISINOPRIL-HYDROCHLOROTHIAZIDE 10-12.5 MG PO TABS: 1.0000 | ORAL_TABLET | Freq: Every day | ORAL | 1 refills | Status: DC

## 2018-03-30 NOTE — Progress Notes (Signed)
BP 128/78   Pulse 69   Temp 98.1 F (36.7 C) (Oral)   Ht 5\' 5"  (1.651 m)   Wt 234 lb (106.1 kg)   SpO2 98%   BMI 38.94 kg/m    Subjective:    Patient ID: Kristin Solis, female    DOB: Jul 31, 1955, 62 y.o.   MRN: [id number]  HPI: Kristin Solis is a 63 y.o. female  Chief Complaint  Patient presents with  . Follow-up  . Hypertension   Here today for 6 month f/u  HTN - BPs stable when checked at home, consistently WNL. Taking lisinopril HCTZ without issue. Tries to watch diet and exercise. Denies Cp, SOB, dizziness, HAs.   IFG - diet controlled, does not check home BSs.   Asthma without recent flares. Taking advair and albuterol  Relevant past medical, surgical, family and social history reviewed and updated as indicated. Interim medical history since our last visit reviewed. Allergies and medications reviewed and updated.  Review of Systems  Per HPI unless specifically indicated above     Objective:    BP 128/78   Pulse 69   Temp 98.1 F (36.7 C) (Oral)   Ht 5\' 5"  (1.651 m)   Wt 234 lb (106.1 kg)   SpO2 98%   BMI 38.94 kg/m   Wt Readings from Last 3 Encounters:  03/30/18 234 lb (106.1 kg)  09/22/17 234 lb (106.1 kg)  05/31/17 234 lb (106.1 kg)    Physical Exam Vitals signs and nursing note reviewed.  Constitutional:      Appearance: Normal appearance. She is not ill-appearing.  HENT:     Head: Atraumatic.  Eyes:     Extraocular Movements: Extraocular movements intact.     Conjunctiva/sclera: Conjunctivae normal.  Neck:     Musculoskeletal: Normal range of motion and neck supple.  Cardiovascular:     Rate and Rhythm: Normal rate and regular rhythm.     Heart sounds: Normal heart sounds.  Pulmonary:     Effort: Pulmonary effort is normal.     Breath sounds: Normal breath sounds. No wheezing.  Musculoskeletal: Normal range of motion.  Skin:    General: Skin is warm and dry.  Neurological:     Mental Status: She is alert and oriented to person,  place, and time.  Psychiatric:        Mood and Affect: Mood normal.        Thought Content: Thought content normal.        Judgment: Judgment normal.     Results for orders placed or performed in visit on 03/30/18  Basic Metabolic Panel (BMET)  Result Value Ref Range   Glucose 74 65 - 99 mg/dL   BUN 15 8 - 27 mg/dL   Creatinine, Ser 3.97 0.57 - 1.00 mg/dL   GFR calc non Af Amer 61 >59 mL/min/1.73   GFR calc Af Amer 70 >59 mL/min/1.73   BUN/Creatinine Ratio 15 12 - 28   Sodium 142 134 - 144 mmol/L   Potassium 3.6 3.5 - 5.2 mmol/L   Chloride 102 96 - 106 mmol/L   CO2 25 20 - 29 mmol/L   Calcium 9.6 8.7 - 10.3 mg/dL  HgB Q7H  Result Value Ref Range   Hgb A1c MFr Bld 5.7 (H) 4.8 - 5.6 %   Est. average glucose Bld gHb Est-mCnc 117 mg/dL      Assessment & Plan:   Problem List Items Addressed This Visit  Cardiovascular and Mediastinum   Essential hypertension, benign    Stable and WNL, continue current regimen      Relevant Medications   lisinopril-hydrochlorothiazide (PRINZIDE,ZESTORETIC) 10-12.5 MG tablet   Other Relevant Orders   Basic Metabolic Panel (BMET) (Completed)     Respiratory   Asthma    Under good control with no recent flares. Continue daily advair and prn albuterol      Relevant Medications   montelukast (SINGULAIR) 10 MG tablet   Fluticasone-Salmeterol (ADVAIR DISKUS) 250-50 MCG/DOSE AEPB   albuterol (PROVENTIL HFA;VENTOLIN HFA) 108 (90 Base) MCG/ACT inhaler     Endocrine   IFG (impaired fasting glucose) - Primary    Recheck A1C, continue good dietary control      Relevant Orders   HgB A1c (Completed)       Follow up plan: Return in about 6 months (around 09/28/2018) for CPE.

## 2018-03-31 LAB — BASIC METABOLIC PANEL
BUN / CREAT RATIO: 15 (ref 12–28)
BUN: 15 mg/dL (ref 8–27)
CHLORIDE: 102 mmol/L (ref 96–106)
CO2: 25 mmol/L (ref 20–29)
Calcium: 9.6 mg/dL (ref 8.7–10.3)
Creatinine, Ser: 1 mg/dL (ref 0.57–1.00)
GFR calc Af Amer: 70 mL/min/{1.73_m2} (ref >59)
GFR calc non Af Amer: 61 mL/min/{1.73_m2} (ref >59)
GLUCOSE: 74 mg/dL (ref 65–99)
POTASSIUM: 3.6 mmol/L (ref 3.5–5.2)
Sodium: 142 mmol/L (ref 134–144)

## 2018-03-31 LAB — HEMOGLOBIN A1C
Est. average glucose Bld gHb Est-mCnc: 117 mg/dL
HEMOGLOBIN A1C: 5.7 % — AB (ref 4.8–5.6)

## 2018-04-04 NOTE — Assessment & Plan Note (Signed)
Under good control with no recent flares. Continue daily advair and prn albuterol

## 2018-04-04 NOTE — Assessment & Plan Note (Signed)
Recheck A1C, continue good dietary control

## 2018-04-04 NOTE — Assessment & Plan Note (Signed)
Stable and WNL, continue current regimen 

## 2018-04-11 ENCOUNTER — Encounter: Payer: Self-pay | Admitting: Family Medicine

## 2018-04-11 ENCOUNTER — Ambulatory Visit (INDEPENDENT_AMBULATORY_CARE_PROVIDER_SITE_OTHER): Payer: BLUE CROSS/BLUE SHIELD | Admitting: Family Medicine

## 2018-04-11 VITALS — BP 136/84 | HR 67 | Temp 97.7°F | Wt 234.7 lb

## 2018-04-11 DIAGNOSIS — H6982 Other specified disorders of Eustachian tube, left ear: Secondary | ICD-10-CM

## 2018-04-11 MED ORDER — PREDNISONE 50 MG PO TABS: 50.0000 mg | ORAL_TABLET | Freq: Every day | ORAL | 0 refills | Status: DC

## 2018-04-11 NOTE — Progress Notes (Signed)
BP 136/84   Pulse 67   Temp 97.7 F (36.5 C) (Oral)   Wt 234 lb 11.2 oz (106.5 kg)   SpO2 97%   BMI 39.06 kg/m    Subjective:    Patient ID: Kristin Solis, female    DOB: [birth year], 63 y.o.   MRN: [id number]  HPI: Kristin Solis is a 63 y.o. female  Chief Complaint  Patient presents with  . Ear Pain    left ear, started last Thursday. States both started hurting but the right one stopped hurting    EAR PAIN Duration: 4-5 days Involved ear(s): bilateral, now just the R Severity:  severe  Quality:  Sharp, dull pain Fever: no Otorrhea: no Upper respiratory infection symptoms: no Pruritus: no Hearing loss: no Water immersion no Using Q-tips: no Recurrent otitis media: no Status: stable Treatments attempted: none  Relevant past medical, surgical, family and social history reviewed and updated as indicated. Interim medical history since our last visit reviewed. Allergies and medications reviewed and updated.  Review of Systems  Constitutional: Negative.   HENT: Positive for ear pain. Negative for congestion, dental problem, drooling, ear discharge, facial swelling, hearing loss, mouth sores, nosebleeds, postnasal drip, rhinorrhea, sinus pressure, sinus pain, sneezing, sore throat, tinnitus, trouble swallowing and voice change.   Respiratory: Negative.   Cardiovascular: Negative.   Neurological: Negative.   Psychiatric/Behavioral: Negative.     Per HPI unless specifically indicated above     Objective:    BP 136/84   Pulse 67   Temp 97.7 F (36.5 C) (Oral)   Wt 234 lb 11.2 oz (106.5 kg)   SpO2 97%   BMI 39.06 kg/m   Wt Readings from Last 3 Encounters:  04/11/18 234 lb 11.2 oz (106.5 kg)  03/30/18 234 lb (106.1 kg)  09/22/17 234 lb (106.1 kg)    Physical Exam Vitals signs and nursing note reviewed.  Constitutional:      General: She is not in acute distress.    Appearance: Normal appearance. She is not ill-appearing, toxic-appearing or diaphoretic.   HENT:     Head: Normocephalic and atraumatic.     Right Ear: Tympanic membrane, ear canal and external ear normal. There is no impacted cerumen.     Left Ear: Tympanic membrane, ear canal and external ear normal. There is no impacted cerumen.     Nose: Nose normal. No congestion or rhinorrhea.     Mouth/Throat:     Mouth: Mucous membranes are moist.     Pharynx: Oropharynx is clear. No oropharyngeal exudate or posterior oropharyngeal erythema.  Eyes:     General: No scleral icterus.       Right eye: No discharge.        Left eye: No discharge.     Extraocular Movements: Extraocular movements intact.     Conjunctiva/sclera: Conjunctivae normal.     Pupils: Pupils are equal, round, and reactive to light.  Neck:     Musculoskeletal: Normal range of motion and neck supple. No neck rigidity or muscular tenderness.     Vascular: No carotid bruit.  Cardiovascular:     Rate and Rhythm: Normal rate and regular rhythm.     Pulses: Normal pulses.     Heart sounds: Normal heart sounds. No murmur. No friction rub. No gallop.   Pulmonary:     Effort: Pulmonary effort is normal. No respiratory distress.     Breath sounds: Normal breath sounds. No stridor. No wheezing, rhonchi or rales.  Chest:     Chest wall: No tenderness.  Musculoskeletal: Normal range of motion.  Lymphadenopathy:     Cervical: Cervical adenopathy present.  Skin:    General: Skin is warm and dry.     Capillary Refill: Capillary refill takes less than 2 seconds.     Coloration: Skin is not jaundiced or pale.     Findings: No bruising, erythema, lesion or rash.  Neurological:     General: No focal deficit present.     Mental Status: She is alert and oriented to person, place, and time. Mental status is at baseline.     Cranial Nerves: No cranial nerve deficit.     Sensory: No sensory deficit.     Motor: No weakness.     Coordination: Coordination normal.     Gait: Gait normal.     Deep Tendon Reflexes: Reflexes normal.    Psychiatric:        Mood and Affect: Mood normal.        Behavior: Behavior normal.        Thought Content: Thought content normal.        Judgment: Judgment normal.     Results for orders placed or performed in visit on 03/30/18  Basic Metabolic Panel (BMET)  Result Value Ref Range   Glucose 74 65 - 99 mg/dL   BUN 15 8 - 27 mg/dL   Creatinine, Ser 5.37 0.57 - 1.00 mg/dL   GFR calc non Af Amer 61 >59 mL/min/1.73   GFR calc Af Amer 70 >59 mL/min/1.73   BUN/Creatinine Ratio 15 12 - 28   Sodium 142 134 - 144 mmol/L   Potassium 3.6 3.5 - 5.2 mmol/L   Chloride 102 96 - 106 mmol/L   CO2 25 20 - 29 mmol/L   Calcium 9.6 8.7 - 10.3 mg/dL  HgB S8O  Result Value Ref Range   Hgb A1c MFr Bld 5.7 (H) 4.8 - 5.6 %   Est. average glucose Bld gHb Est-mCnc 117 mg/dL      Assessment & Plan:   Problem List Items Addressed This Visit    None    Visit Diagnoses    Dysfunction of left eustachian tube    -  Primary   Will treat with burst of prednisone. Call with any concerns. Continue to monitor.        Follow up plan: Return if symptoms worsen or fail to improve.

## 2018-04-17 ENCOUNTER — Encounter (HOSPITAL_COMMUNITY)
Admission: RE | Disposition: A | Discharge status: Home Health | Payer: Self-pay | Source: Other Acute Inpatient Hospital | Attending: Neurology

## 2018-04-17 ENCOUNTER — Emergency Department
Admission: EM | Admit: 2018-04-17 | Discharge: 2018-04-17 | Disposition: A | Discharge status: Other Acute Inpatient Hospital | Payer: BLUE CROSS/BLUE SHIELD | Attending: Emergency Medicine | Admitting: Emergency Medicine

## 2018-04-17 ENCOUNTER — Inpatient Hospital Stay (HOSPITAL_COMMUNITY)
Admission: RE | Admit: 2018-04-17 | Discharge: 2018-04-23 | DRG: 023 | Disposition: A | Discharge status: Home Health | Payer: BLUE CROSS/BLUE SHIELD | Source: Other Acute Inpatient Hospital | Attending: Neurology | Admitting: Neurology

## 2018-04-17 ENCOUNTER — Emergency Department (HOSPITAL_COMMUNITY): Payer: BLUE CROSS/BLUE SHIELD

## 2018-04-17 ENCOUNTER — Emergency Department: Payer: BLUE CROSS/BLUE SHIELD

## 2018-04-17 ENCOUNTER — Emergency Department (HOSPITAL_COMMUNITY): Payer: BLUE CROSS/BLUE SHIELD | Admitting: Anesthesiology

## 2018-04-17 ENCOUNTER — Other Ambulatory Visit: Payer: Self-pay

## 2018-04-17 DIAGNOSIS — R29818 Other symptoms and signs involving the nervous system: Secondary | ICD-10-CM | POA: Diagnosis not present

## 2018-04-17 DIAGNOSIS — I6522 Occlusion and stenosis of left carotid artery: Secondary | ICD-10-CM | POA: Diagnosis not present

## 2018-04-17 DIAGNOSIS — I6602 Occlusion and stenosis of left middle cerebral artery: Secondary | ICD-10-CM | POA: Diagnosis not present

## 2018-04-17 DIAGNOSIS — Z634 Disappearance and death of family member: Secondary | ICD-10-CM | POA: Insufficient documentation

## 2018-04-17 DIAGNOSIS — R4701 Aphasia: Secondary | ICD-10-CM | POA: Diagnosis present

## 2018-04-17 DIAGNOSIS — G9340 Encephalopathy, unspecified: Secondary | ICD-10-CM | POA: Diagnosis not present

## 2018-04-17 DIAGNOSIS — I639 Cerebral infarction, unspecified: Secondary | ICD-10-CM

## 2018-04-17 DIAGNOSIS — I63232 Cerebral infarction due to unspecified occlusion or stenosis of left carotid arteries: Secondary | ICD-10-CM | POA: Diagnosis not present

## 2018-04-17 DIAGNOSIS — Z978 Presence of other specified devices: Secondary | ICD-10-CM

## 2018-04-17 DIAGNOSIS — Z6841 Body Mass Index (BMI) 40.0 and over, adult: Secondary | ICD-10-CM

## 2018-04-17 DIAGNOSIS — Z833 Family history of diabetes mellitus: Secondary | ICD-10-CM | POA: Diagnosis not present

## 2018-04-17 DIAGNOSIS — I63312 Cerebral infarction due to thrombosis of left middle cerebral artery: Secondary | ICD-10-CM | POA: Diagnosis not present

## 2018-04-17 DIAGNOSIS — E785 Hyperlipidemia, unspecified: Secondary | ICD-10-CM

## 2018-04-17 DIAGNOSIS — E876 Hypokalemia: Secondary | ICD-10-CM | POA: Diagnosis not present

## 2018-04-17 DIAGNOSIS — G8191 Hemiplegia, unspecified affecting right dominant side: Secondary | ICD-10-CM | POA: Diagnosis present

## 2018-04-17 DIAGNOSIS — J45909 Unspecified asthma, uncomplicated: Secondary | ICD-10-CM | POA: Insufficient documentation

## 2018-04-17 DIAGNOSIS — I63512 Cerebral infarction due to unspecified occlusion or stenosis of left middle cerebral artery: Principal | ICD-10-CM | POA: Diagnosis present

## 2018-04-17 DIAGNOSIS — Z9282 Status post administration of tPA (rtPA) in a different facility within the last 24 hours prior to admission to current facility: Secondary | ICD-10-CM

## 2018-04-17 DIAGNOSIS — I10 Essential (primary) hypertension: Secondary | ICD-10-CM | POA: Insufficient documentation

## 2018-04-17 DIAGNOSIS — G4733 Obstructive sleep apnea (adult) (pediatric): Secondary | ICD-10-CM | POA: Diagnosis present

## 2018-04-17 DIAGNOSIS — I161 Hypertensive emergency: Secondary | ICD-10-CM | POA: Diagnosis not present

## 2018-04-17 DIAGNOSIS — I6389 Other cerebral infarction: Secondary | ICD-10-CM | POA: Diagnosis not present

## 2018-04-17 DIAGNOSIS — Z931 Gastrostomy status: Secondary | ICD-10-CM | POA: Diagnosis not present

## 2018-04-17 DIAGNOSIS — R1313 Dysphagia, pharyngeal phase: Secondary | ICD-10-CM | POA: Diagnosis present

## 2018-04-17 DIAGNOSIS — J9690 Respiratory failure, unspecified, unspecified whether with hypoxia or hypercapnia: Secondary | ICD-10-CM | POA: Diagnosis not present

## 2018-04-17 DIAGNOSIS — Z811 Family history of alcohol abuse and dependence: Secondary | ICD-10-CM | POA: Diagnosis not present

## 2018-04-17 DIAGNOSIS — Z825 Family history of asthma and other chronic lower respiratory diseases: Secondary | ICD-10-CM

## 2018-04-17 DIAGNOSIS — R4702 Dysphasia: Secondary | ICD-10-CM | POA: Diagnosis present

## 2018-04-17 DIAGNOSIS — Z0189 Encounter for other specified special examinations: Secondary | ICD-10-CM

## 2018-04-17 DIAGNOSIS — R479 Unspecified speech disturbances: Secondary | ICD-10-CM | POA: Diagnosis present

## 2018-04-17 DIAGNOSIS — Z7951 Long term (current) use of inhaled steroids: Secondary | ICD-10-CM | POA: Diagnosis not present

## 2018-04-17 DIAGNOSIS — Z823 Family history of stroke: Secondary | ICD-10-CM

## 2018-04-17 DIAGNOSIS — Z8673 Personal history of transient ischemic attack (TIA), and cerebral infarction without residual deficits: Secondary | ICD-10-CM | POA: Diagnosis present

## 2018-04-17 DIAGNOSIS — J9600 Acute respiratory failure, unspecified whether with hypoxia or hypercapnia: Secondary | ICD-10-CM | POA: Diagnosis not present

## 2018-04-17 DIAGNOSIS — Z8249 Family history of ischemic heart disease and other diseases of the circulatory system: Secondary | ICD-10-CM | POA: Diagnosis not present

## 2018-04-17 DIAGNOSIS — J9811 Atelectasis: Secondary | ICD-10-CM | POA: Diagnosis not present

## 2018-04-17 DIAGNOSIS — Z9889 Other specified postprocedural states: Secondary | ICD-10-CM | POA: Diagnosis not present

## 2018-04-17 DIAGNOSIS — Z801 Family history of malignant neoplasm of trachea, bronchus and lung: Secondary | ICD-10-CM | POA: Diagnosis not present

## 2018-04-17 DIAGNOSIS — R29717 NIHSS score 17: Secondary | ICD-10-CM | POA: Diagnosis present

## 2018-04-17 DIAGNOSIS — Z836 Family history of other diseases of the respiratory system: Secondary | ICD-10-CM

## 2018-04-17 HISTORY — PX: RADIOLOGY WITH ANESTHESIA: SHX6223

## 2018-04-17 LAB — CBC
HCT: 43.9 % (ref 36.0–46.0)
Hemoglobin: 14.1 g/dL (ref 12.0–15.0)
MCH: 28.7 pg (ref 26.0–34.0)
MCHC: 32.1 g/dL (ref 30.0–36.0)
MCV: 89.4 fL (ref 80.0–100.0)
Platelets: 331 10*3/uL (ref 150–400)
RBC: 4.91 MIL/uL (ref 3.87–5.11)
RDW: 13.2 % (ref 11.5–15.5)
WBC: 9.9 10*3/uL (ref 4.0–10.5)
nRBC: 0 % (ref 0.0–0.2)

## 2018-04-17 LAB — COMPREHENSIVE METABOLIC PANEL
ALT: 22 U/L (ref 0–44)
AST: 19 U/L (ref 15–41)
Albumin: 3.7 g/dL (ref 3.5–5.0)
Alkaline Phosphatase: 77 U/L (ref 38–126)
Anion gap: 11 (ref 5–15)
BUN: 22 mg/dL (ref 8–23)
CO2: 27 mmol/L (ref 22–32)
Calcium: 8.9 mg/dL (ref 8.9–10.3)
Chloride: 101 mmol/L (ref 98–111)
Creatinine, Ser: 1.04 mg/dL — ABNORMAL HIGH (ref 0.44–1.00)
GFR calc Af Amer: >60 mL/min (ref >60)
GFR calc non Af Amer: 58 mL/min — ABNORMAL LOW (ref >60)
GLUCOSE: 163 mg/dL — AB (ref 70–99)
Potassium: 3.5 mmol/L (ref 3.5–5.1)
Sodium: 139 mmol/L (ref 135–145)
Total Bilirubin: 0.6 mg/dL (ref 0.3–1.2)
Total Protein: 6.7 g/dL (ref 6.5–8.1)

## 2018-04-17 LAB — COOXEMETRY PANEL
Carboxyhemoglobin: 0.8 % (ref 0.5–1.5)
Methemoglobin: 0.7 % (ref 0.0–1.5)
O2 Saturation: 41.8 %
TOTAL OXYGEN CONTENT: 41.2 mL/dL
Total hemoglobin: 14 g/dL (ref 12.0–16.0)

## 2018-04-17 LAB — DIFFERENTIAL
Abs Immature Granulocytes: 0.1 10*3/uL — ABNORMAL HIGH (ref 0.00–0.07)
BASOS ABS: 0 10*3/uL (ref 0.0–0.1)
Basophils Relative: 0 %
EOS PCT: 2 %
Eosinophils Absolute: 0.2 10*3/uL (ref 0.0–0.5)
Immature Granulocytes: 1 %
Lymphocytes Relative: 26 %
Lymphs Abs: 2.5 10*3/uL (ref 0.7–4.0)
Monocytes Absolute: 0.7 10*3/uL (ref 0.1–1.0)
Monocytes Relative: 8 %
Neutro Abs: 6.3 10*3/uL (ref 1.7–7.7)
Neutrophils Relative %: 63 %

## 2018-04-17 LAB — ETHANOL

## 2018-04-17 LAB — PROTIME-INR
INR: 1 (ref 0.8–1.2)
Prothrombin Time: 13 seconds (ref 11.4–15.2)

## 2018-04-17 LAB — GLUCOSE, CAPILLARY: Glucose-Capillary: 122 mg/dL — ABNORMAL HIGH (ref 70–99)

## 2018-04-17 LAB — APTT: aPTT: 27 seconds (ref 24–36)

## 2018-04-17 SURGERY — RADIOLOGY WITH ANESTHESIA
Anesthesia: General

## 2018-04-17 MED ORDER — ASPIRIN 325 MG PO TABS
ORAL_TABLET | ORAL | Status: AC
  Filled 2018-04-17: qty 1

## 2018-04-17 MED ORDER — TIROFIBAN HCL IN NACL 5-0.9 MG/100ML-% IV SOLN
INTRAVENOUS | Status: AC
  Filled 2018-04-17: qty 100

## 2018-04-17 MED ORDER — CLOPIDOGREL BISULFATE 300 MG PO TABS
ORAL_TABLET | ORAL | Status: AC
  Filled 2018-04-17: qty 1

## 2018-04-17 MED ORDER — PREDNISONE 20 MG PO TABS: 50.0000 mg | ORAL_TABLET | Freq: Every day | ORAL | Status: DC

## 2018-04-17 MED ORDER — FENTANYL CITRATE (PF) 100 MCG/2ML IJ SOLN
INTRAMUSCULAR | Status: AC
  Filled 2018-04-17: qty 2

## 2018-04-17 MED ORDER — ACETAMINOPHEN 325 MG PO TABS
650.0000 mg | ORAL_TABLET | ORAL | Status: DC | PRN
  Filled 2018-04-17: qty 2

## 2018-04-17 MED ORDER — TICAGRELOR 90 MG PO TABS
ORAL_TABLET | ORAL | Status: AC
  Administered 2018-04-17: 180 mg via OROGASTRIC
  Filled 2018-04-17: qty 2

## 2018-04-17 MED ORDER — ASPIRIN 81 MG PO CHEW
CHEWABLE_TABLET | ORAL | Status: AC
  Administered 2018-04-17: 81 mg via OROGASTRIC
  Filled 2018-04-17: qty 1

## 2018-04-17 MED ORDER — SENNOSIDES-DOCUSATE SODIUM 8.6-50 MG PO TABS: 1.0000 | ORAL_TABLET | Freq: Every evening | ORAL | Status: DC | PRN

## 2018-04-17 MED ORDER — SODIUM CHLORIDE 0.9 % IV SOLN
INTRAVENOUS | Status: DC | PRN
  Administered 2018-04-17: 22:00:00 via INTRAVENOUS

## 2018-04-17 MED ORDER — EPTIFIBATIDE 20 MG/10ML IV SOLN
INTRAVENOUS | Status: AC
  Administered 2018-04-17: 3 mg via INTRA_ARTERIAL
  Filled 2018-04-17: qty 10

## 2018-04-17 MED ORDER — IOPAMIDOL (ISOVUE-370) INJECTION 76%
115.0000 mL | Freq: Once | INTRAVENOUS | Status: AC | PRN
  Administered 2018-04-17: 150 mL via INTRAVENOUS

## 2018-04-17 MED ORDER — SODIUM CHLORIDE 0.9 % IV BOLUS
1000.0000 mL | Freq: Once | INTRAVENOUS | Status: AC
  Administered 2018-04-17: 1000 mL via INTRAVENOUS

## 2018-04-17 MED ORDER — PANTOPRAZOLE SODIUM 40 MG IV SOLR
40.0000 mg | Freq: Every day | INTRAVENOUS | Status: DC
  Administered 2018-04-18 – 2018-04-22 (×5): 40 mg via INTRAVENOUS
  Filled 2018-04-17 (×5): qty 40

## 2018-04-17 MED ORDER — LISINOPRIL-HYDROCHLOROTHIAZIDE 10-12.5 MG PO TABS: 1.0000 | ORAL_TABLET | Freq: Every day | ORAL | Status: DC

## 2018-04-17 MED ORDER — ROCURONIUM BROMIDE 50 MG/5ML IV SOSY
PREFILLED_SYRINGE | INTRAVENOUS | Status: DC | PRN
  Administered 2018-04-17 (×2): 20 mg via INTRAVENOUS
  Administered 2018-04-17: 50 mg via INTRAVENOUS

## 2018-04-17 MED ORDER — NITROGLYCERIN 1 MG/10 ML FOR IR/CATH LAB
INTRA_ARTERIAL | Status: AC
  Administered 2018-04-17 (×2): 25 ug via INTRA_ARTERIAL
  Filled 2018-04-17: qty 10

## 2018-04-17 MED ORDER — MOMETASONE FURO-FORMOTEROL FUM 200-5 MCG/ACT IN AERO
2.0000 | INHALATION_SPRAY | Freq: Two times a day (BID) | RESPIRATORY_TRACT | Status: DC
  Administered 2018-04-19 – 2018-04-21 (×5): 2 via RESPIRATORY_TRACT
  Filled 2018-04-17 (×2): qty 8.8

## 2018-04-17 MED ORDER — DEXAMETHASONE SODIUM PHOSPHATE 10 MG/ML IJ SOLN
INTRAMUSCULAR | Status: DC | PRN
  Administered 2018-04-17: 5 mg via INTRAVENOUS

## 2018-04-17 MED ORDER — LABETALOL HCL 5 MG/ML IV SOLN
20.0000 mg | Freq: Once | INTRAVENOUS | Status: DC
  Filled 2018-04-17: qty 4

## 2018-04-17 MED ORDER — CEFAZOLIN SODIUM-DEXTROSE 1-4 GM/50ML-% IV SOLN
INTRAVENOUS | Status: DC | PRN
  Administered 2018-04-17: 2 g via INTRAVENOUS

## 2018-04-17 MED ORDER — CLEVIDIPINE BUTYRATE 0.5 MG/ML IV EMUL
0.0000 mg/h | INTRAVENOUS | Status: DC
  Filled 2018-04-17: qty 50

## 2018-04-17 MED ORDER — LIDOCAINE 2% (20 MG/ML) 5 ML SYRINGE
INTRAMUSCULAR | Status: DC | PRN
  Administered 2018-04-17: 40 mg via INTRAVENOUS

## 2018-04-17 MED ORDER — ALBUTEROL SULFATE (2.5 MG/3ML) 0.083% IN NEBU: 2.5000 mg | INHALATION_SOLUTION | Freq: Four times a day (QID) | RESPIRATORY_TRACT | Status: DC | PRN

## 2018-04-17 MED ORDER — MONTELUKAST SODIUM 10 MG PO TABS
10.0000 mg | ORAL_TABLET | Freq: Every day | ORAL | Status: DC
  Administered 2018-04-18: 10 mg via ORAL
  Filled 2018-04-17: qty 1

## 2018-04-17 MED ORDER — STROKE: EARLY STAGES OF RECOVERY BOOK
Freq: Once | Status: AC
  Administered 2018-04-18: 02:00:00

## 2018-04-17 MED ORDER — PROPOFOL 10 MG/ML IV BOLUS
INTRAVENOUS | Status: DC | PRN
  Administered 2018-04-17: 160 mg via INTRAVENOUS

## 2018-04-17 MED ORDER — ONDANSETRON HCL 4 MG/2ML IJ SOLN
4.0000 mg | Freq: Once | INTRAMUSCULAR | Status: AC
  Administered 2018-04-17: 4 mg via INTRAVENOUS

## 2018-04-17 MED ORDER — SODIUM CHLORIDE 0.9 % IV SOLN
INTRAVENOUS | Status: DC
  Administered 2018-04-18 – 2018-04-20 (×4): via INTRAVENOUS

## 2018-04-17 MED ORDER — CEFAZOLIN SODIUM-DEXTROSE 2-4 GM/100ML-% IV SOLN
INTRAVENOUS | Status: AC
  Filled 2018-04-17: qty 100

## 2018-04-17 MED ORDER — ACETAMINOPHEN 650 MG RE SUPP: 650.0000 mg | RECTAL | Status: DC | PRN

## 2018-04-17 MED ORDER — ACETAMINOPHEN 160 MG/5ML PO SOLN
650.0000 mg | ORAL | Status: DC | PRN
  Filled 2018-04-17: qty 20.3

## 2018-04-17 MED ORDER — ONDANSETRON HCL 4 MG/2ML IJ SOLN
4.0000 mg | Freq: Once | INTRAMUSCULAR | Status: DC
  Filled 2018-04-17: qty 2

## 2018-04-17 MED ORDER — SUCCINYLCHOLINE CHLORIDE 20 MG/ML IJ SOLN
INTRAMUSCULAR | Status: DC | PRN
  Administered 2018-04-17: 120 mg via INTRAVENOUS

## 2018-04-17 MED ORDER — FENTANYL CITRATE (PF) 100 MCG/2ML IJ SOLN
INTRAMUSCULAR | Status: DC | PRN
  Administered 2018-04-17 (×2): 50 ug via INTRAVENOUS

## 2018-04-17 MED ORDER — SODIUM CHLORIDE 0.9 % IV SOLN
INTRAVENOUS | Status: DC | PRN
  Administered 2018-04-17: 50 ug/min via INTRAVENOUS

## 2018-04-17 MED ORDER — ALTEPLASE 100 MG IV SOLR
90.0000 mg | Freq: Once | INTRAVENOUS | Status: AC
  Administered 2018-04-17: 90 mg
  Filled 2018-04-17: qty 100

## 2018-04-17 NOTE — Anesthesia Preprocedure Evaluation (Addendum)
Anesthesia Evaluation  Patient identified by MRN, date of birth, ID band Patient awake    Reviewed: Allergy & Precautions, NPO status , Patient's Chart, lab work & pertinent test results  History of Anesthesia Complications Negative for: history of anesthetic complications  Airway Mallampati: II  TM Distance: >3 FB Neck ROM: Full    Dental  (+) Edentulous Upper, Poor Dentition, Dental Advisory Given   Pulmonary asthma (inhalers) ,    breath sounds clear to auscultation       Cardiovascular hypertension, Pt. on medications  Rhythm:Regular Rate:Normal  '15 ECHO: EF >55%, normal wall motion and valves   Neuro/Psych CVA (acute CVA, R hemiparesis), Residual Symptoms    GI/Hepatic negative GI ROS, Neg liver ROS,   Endo/Other  diabetes (diet controlled, glu 122)Morbid obesity  Renal/GU negative Renal ROS     Musculoskeletal   Abdominal (+) + obese,   Peds  Hematology negative hematology ROS (+)   Anesthesia Other Findings   Reproductive/Obstetrics S/p hysterectomy                            Anesthesia Physical Anesthesia Plan  ASA: III and emergent  Anesthesia Plan: General   Post-op Pain Management:    Induction: Intravenous, Rapid sequence and Cricoid pressure planned  PONV Risk Score and Plan: 3 and Ondansetron and Dexamethasone  Airway Management Planned: Oral ETT  Additional Equipment: Arterial line  Intra-op Plan:   Post-operative Plan: Possible Post-op intubation/ventilation  Informed Consent: I have reviewed the patients History and Physical, chart, labs and discussed the procedure including the risks, benefits and alternatives for the proposed anesthesia with the patient or authorized representative who has indicated his/her understanding and acceptance.     Dental advisory given, Consent reviewed with POA and Only emergency history available  Plan Discussed with: CRNA  and Surgeon  Anesthesia Plan Comments:        Anesthesia Quick Evaluation

## 2018-04-17 NOTE — ED Notes (Signed)
Code stroke canceled at this time 

## 2018-04-17 NOTE — ED Notes (Signed)
Code stroke reactivated at this time. MD at bedside.

## 2018-04-17 NOTE — ED Notes (Signed)
Patient transported to CT 

## 2018-04-17 NOTE — Progress Notes (Signed)
Ch received a pg for code STROKE in ER. Pt was somewhat responsive yet had limited mobility/movement on right side. Pt husband was being interviewed by the e-ICU provider regarding the pt medical history, allergies, and activities that lead them to the ER. Pt had worked earlier that day and drove herself home from [place] w/o any issues reported. Pt was not very responsive. Pt Husband as comforted while the care team prepped the pt for a CT scan. f/u recomm.        04/17/18 2100  Clinical Encounter Type  Visited With Patient and family together;Health care provider  Visit Type Psychological support;Spiritual support;Social support;Code  Referral From Nurse  Consult/Referral To Chaplain  Spiritual Encounters  Spiritual Needs Emotional;Grief support  Stress Factors  Patient Stress Factors Health changes;Loss of control;Loss;Major life changes  Family Stress Factors Major life changes;Loss of control

## 2018-04-17 NOTE — ED Notes (Addendum)
Tele neuro MD calling last known well 1730 due to this is when pt came home from work and spoke normally to husband, but this pt is documented to be well upon arrival here with MD. Pt had a new and separate episode starting at 57.

## 2018-04-17 NOTE — Consult Note (Signed)
CODE STROKE- PHARMACY COMMUNICATION   Time CODE STROKE called/page received: 2007  Time response to CODE STROKE was made (in person or via phone): 2011  Time Stroke Kit retrieved from [place] (only if needed): Sent technician to help retrieve Tpa kit from [employer]32  Name of Provider/Nurse contacted: Spoke with nurse said they are administering tpa.  Then spoke with Dr Jimmye Norman  Past Medical History:  Diagnosis Date  . Asthma   . Hypertension    Prior to Admission medications   Medication Sig Start Date End Date Taking? Authorizing Provider  albuterol (PROVENTIL HFA;VENTOLIN HFA) 108 (90 Base) MCG/ACT inhaler Inhale 2 puffs into the lungs every 6 (six) hours as needed for wheezing or shortness of breath. 03/30/18   Volney American, PA-C  fluticasone [facility]) 50 MCG/ACT nasal spray Place 2 sprays into both nostrils daily. 03/30/18   Volney American, PA-C  Fluticasone-Salmeterol (ADVAIR DISKUS) 250-50 MCG/DOSE AEPB Inhale 1 puff into the lungs 2 (two) times daily. 03/30/18   Volney American, PA-C  ibuprofen (ADVIL,MOTRIN) 800 MG tablet Take 1 tablet (800 mg total) by mouth every 8 (eight) hours as needed. 10/22/16   Little, Traci M, PA-C  lisinopril-hydrochlorothiazide (PRINZIDE,ZESTORETIC) 10-12.5 MG tablet Take 1 tablet by mouth daily. 03/30/18   Volney American, PA-C  montelukast (SINGULAIR) 10 MG tablet Take 1 tablet (10 mg total) by mouth at bedtime. 03/30/18   Volney American, PA-C  predniSONE (DELTASONE) 50 MG tablet Take 1 tablet (50 mg total) by mouth daily with breakfast. 04/11/18   Valerie Roys, DO    Lu Duffel, PharmD, BCPS Clinical Pharmacist 04/17/2018 8:26 PM

## 2018-04-17 NOTE — ED Notes (Addendum)
Pt vomitting. MD at bedside. Tele neuro MD answered.

## 2018-04-17 NOTE — ED Provider Notes (Signed)
Patient's initial neurologic exam was completely normal.  Upon recheck she has a complete flaccid paralysis.  She can raise the right eyebrow but the right lower face is drooped and she is drooling out of the right side of her mouth.  She has complete hemiparesis of the right arm and right leg   Emily Filbert, MD 04/17/18 1945

## 2018-04-17 NOTE — Consult Note (Addendum)
[facility] Tele[facility]urology Consult Services   Date of Service:   04/17/2018 20:03:01  Impression:     .  Left Hemispheric Infarct     .  MCA Distribution Infarct  Comments/Sign-Out: The patient is a 63 year old woman with the beginning of strokelike symptoms at 17:30. While her clinical exam had normalized in the ED she already has evidence of hypodensity in the left insular region suggesting that she's been evolving infarct since her symptoms began which is the reason the last known normal is 17:30. She was administered acute thrombolytic therapy and she will need to get stat CTA head and neck and perfusion imaging to evaluate for large vessel occlusion which seems probable. If large vessel occlusion is seen she would need transfer to facility with neuro-interventional. Her blood pressure is borderline low her pulses are symmetric in both arms along with blood pressures symmetric in both arms and she was not complaining of any chest pain.  Mechanism of Stroke: Not Clear  Metrics: Last Known Well: 04/17/2018 17:30:00 [facility] Notification Time: 04/17/2018 20:03:01 Arrival Time: 04/17/2018 19:06:00 Stamp Time: 04/17/2018 20:03:01 Time First Login Attempt: 04/17/2018 20:05:26 Video Start Time: 04/17/2018 20:05:26  Symptoms: right sided weakness NIHSS Start Assessment Time: 04/17/2018 20:05:00 tPA Verbal Order Time: 04/17/2018 20:17:39 Patient is a candidate for tPA. tPA CPOE Order Time: 04/17/2018 20:17:38 Needle Time: 04/17/2018 20:34:19 Weight Noted by Staff: 112 kg Video End Time: 04/17/2018 20:40:49  CT head was reviewed and results were: aspects score of 8 with some hypo density in the left insular region  Clinical Presentation is Suggestive of Large Vessel Occlusive Disease, Recommendations are as Follows  CTA Head and Neck. CT Perfusion.   ED Physician notified of diagnostic impression and management plan on 04/17/2018 20:33:06  Verbal Consent to tPA: I have  explained to the Patient and Family the nature of the patient's condition, the use of tPA fibrinolytic agent, and the benefits to be reasonably expected compared with alternative approaches. I have discussed the likelihood of major risks or complications of this procedure including (if applicable) but not limited to loss of limb function, brain damage, paralysis, hemorrhage, infection, complications from transfusion of blood components, drug reactions, blood clots and loss of life. I have also indicated that with any procedure there is always the possibility of an unexpected complication. All questions were answered and Patient and Family express understanding of the treatment plan and consent to the treatment.  Our recommendations are outlined below.  Recommendations: IV tPA recommended.  tPA bolus given Without Complication.   IV tPA Total Dose - 90.0 mg IV tPA Bolus Dose - 9.0 mg IV tPA Infusion Dose - 81.0 mg  Routine post tPA monitoring including neuro checks and blood pressure control during/after treatment Monitor blood pressure Check blood pressure and NIHSS every 15 min for 2 h, then every 30 min for 6 h, and finally every hour for 16 h.  Manage Blood Pressure per post tPA protocol.      .  Admission to ICU     .  CT brain 24 hours post tPA     .  NPO until swallowing screen performed and passed     .  No antiplatelet agents or anticoagulants (including heparin for DVT prophylaxis) in first 24 hours     .  No Foley catheter, nasogastric tube, arterial catheter or central venous catheter for 24 hr, unless absolutely necessary     .  Telemetry     .  Bedside swallow evaluation     .  HOB less than 30 degrees     .  Euglycemia     .  Avoid hyperthermia, PRN acetaminophen     .  DVT prophylaxis     .  Inpatient Neurology Consultation     .  Stroke evaluation as per inpatient neurology recommendations  Discussed with ED  physician    ------------------------------------------------------------------------------  History of Present Illness: Patient is a 63 year old Female.  Patient was brought by EMS for symptoms of right sided weakness  Hx HTN asthma takes no thinners. Normal at 6am then episodes of drooling and right sided weakness. apparently she walked in from work at 17:30 spoken to her husband without difficulty and had no complaints. Developed right-sided weakness and slurred speech and aphasia. she did not have any focal deficit on neurologic examination when she originally presented to the ED however her seated shows hypodensity in the left insular region.  CT head was reviewed.  Last seen normal was within 4.5 hours. There is no history of hemorrhagic complications or intracranial hemorrhage. There is no history of Recent Anticoagulants. There is no history of recent major surgery. There is no history of recent stroke.  Examination: BP(124/97), Blood Glucose(122) 1A: Level of Consciousness - Alert; keenly responsive + 0 1B: Ask Month and Age - Aphasic + 2 1C: Blink Eyes & Squeeze Hands - Performs 1 Task + 1 2: Test Horizontal Extraocular Movements - Normal + 0 3: Test Visual Fields - Complete Hemianopia + 2 4: Test Facial Palsy (Use Grimace if Obtunded) - Partial paralysis (lower face) + 2 5A: Test Left Arm Motor Drift - No Drift for 10 Seconds + 0 5B: Test Right Arm Motor Drift - No Effort Against Gravity + 3 6A: Test Left Leg Motor Drift - No Drift for 5 Seconds + 0 6B: Test Right Leg Motor Drift - No Effort Against Gravity + 3 7: Test Limb Ataxia (FNF/Heel-Shin) - No Ataxia + 0 8: Test Sensation - Normal; No sensory loss + 0 9: Test Language/Aphasia - Mild-Moderate Aphasia: Some Obvious Changes, Without Significant Limitation + 1 10: Test Dysarthria - Mild-Moderate Dysarthria: Slurring but can be understood + 1 11: Test Extinction/Inattention - No abnormality + 0  NIHSS Score:  15  Patient was informed the Neurology Consult would happen via TeleHealth consult by way of interactive audio and video telecommunications and consented to receiving care in this manner.  Due to the immediate potential for life-threatening deterioration due to underlying acute neurologic illness, I spent 35 minutes providing critical care. This time includes time for face to face visit via telemedicine, review of medical records, imaging studies and discussion of findings with providers, the patient and/or family.   Dr Jossie Ng   [facility] [phone]    ADDENDUM: cta h/n perfusion L M2 occlusion 38mL penumbra 76mL core d/w neurohospitalist at [place] who with have NIR eval the patient for probable intervention

## 2018-04-17 NOTE — ED Notes (Signed)
Pt starting having difficulty speaking and drooling with right sided weakness.

## 2018-04-17 NOTE — ED Notes (Signed)
Pt taken to CT with Lurena Joiner, RN

## 2018-04-17 NOTE — ED Notes (Addendum)
Bolus of 9mg  given over one minute. 69ml/10mg  of TPA wasted with Lurena Joiner, RN.

## 2018-04-17 NOTE — ED Notes (Signed)
MD in room

## 2018-04-17 NOTE — ED Provider Notes (Signed)
Patient receiving TPA, NIH stroke scale is roughly 18.  She has extensive dysarthria, right lower facial paralysis, right arm and right leg without any movement.  I will discuss with the [facility] transfer center for ICU admission here.  She will receive CT angiogram prior to transfer.   Emily Filbert, MD 04/17/18 [phone]

## 2018-04-17 NOTE — ED Triage Notes (Signed)
Patient states she woke up on Saturday morning around 7-800 a.m. She describes feelings of "being in trouble, nervous/anxious". At 1730 tonight she was trying "to tell her husband that a friend of hers daughter had committed suicide last night and I couldn't get the words out." Also states she couldn't find her keys and couldn't "make a sentence." Currently, patient's speech is fluent and words are connecting to make comprehensible sentences and speech is not slurred. Husband told patient she "was slobbering all over herself." Denies pain, denies headache and denies N/V.

## 2018-04-17 NOTE — ED Provider Notes (Signed)
[facility] Emergency Department Provider Note       Time seen: ----------------------------------------- 7:25 PM on 04/17/2018 -----------------------------------------   I have reviewed the triage vital signs and the nursing notes.  HISTORY   Chief Complaint Weakness (Left sided x 24 hours)   HPI Kristin Solis is a 63 y.o. female with a history of asthma, hypertension who presents to the ED for speech difficulty.  Patient states she woke up on Saturday morning around 8 AM and describes feeling of nervousness and anxiousness.  She was having trouble getting her words out.  She had a third cousin commit suicide last night.  Currently on arrival her speech is normal.  Her husband reported in triage that she was slobbering all over herself.  Past Medical History:  Diagnosis Date  . Asthma   . Hypertension     Patient Active Problem List   Diagnosis Date Noted  . IFG (impaired fasting glucose) 03/28/2018  . Essential hypertension, benign 01/15/2017  . Asthma 01/15/2017    Past Surgical History:  Procedure Laterality Date  . ABDOMINAL HYSTERECTOMY  2005   total  . OOPHORECTOMY      Allergies Patient has no known allergies.  Social History Social History   Tobacco Use  . Smoking status: Never Smoker  . Smokeless tobacco: Never Used  Substance Use Topics  . Alcohol use: No  . Drug use: No   Review of Systems Constitutional: Negative for fever. Cardiovascular: Negative for chest pain. Respiratory: Negative for shortness of breath. Gastrointestinal: Negative for abdominal pain, vomiting and diarrhea. Musculoskeletal: Negative for back pain. Skin: Negative for rash. Neurological: Positive for speech disturbance  All systems negative/normal/unremarkable except as stated in the HPI  ____________________________________________   PHYSICAL EXAM:  VITAL SIGNS: ED Triage Vitals [04/17/18 1914]  Enc Vitals Group     BP (!) 170/57   Pulse Rate 83     Resp 18     Temp 97.6 F (36.4 C)     Temp Source Oral     SpO2 98 %     Weight 247 lb (112 kg)     Height 5\' 3"  (1.6 m)     Head Circumference      Peak Flow      Pain Score 0     Pain Loc      Pain Edu?      Excl. in GC?    Constitutional: Alert and oriented. Well appearing and in no distress. Eyes: Conjunctivae are normal. Normal extraocular movements. ENT      Head: Normocephalic and atraumatic.      Nose: No congestion/rhinnorhea.      Mouth/Throat: Mucous membranes are moist.      Neck: No stridor. Cardiovascular: Normal rate, regular rhythm. No murmurs, rubs, or gallops. Respiratory: Normal respiratory effort without tachypnea nor retractions. Breath sounds are clear and equal bilaterally. No wheezes/rales/rhonchi. Gastrointestinal: Soft and nontender. Normal bowel sounds Musculoskeletal: Nontender with normal range of motion in extremities. No lower extremity tenderness nor edema. Neurologic:  Normal speech and language. No gross focal neurologic deficits are appreciated.  Strength, sensation, cranial nerves are normal.  No pronator drift Skin:  Skin is warm, dry and intact. No rash noted. Psychiatric: Mood and affect are normal. Speech and behavior are normal.  ____________________________________________  EKG: Interpreted by me.  Sinus rhythm the rate 80 bpm, normal PR interval, low voltage, normal axis.  ____________________________________________  ED COURSE:  As part of my medical decision making, I  reviewed the following data within the electronic MEDICAL RECORD NUMBER History obtained from family if available, nursing notes, old chart and ekg, as well as notes from prior ED visits. Patient presented for speech disturbance, we will assess with labs and imaging as indicated at this time.   Procedures ____________________________________________   LABS (pertinent positives/negatives)  Labs Reviewed  DIFFERENTIAL - Abnormal; Notable for the  following components:      Result Value   Abs Immature Granulocytes 0.10 (*)    All other components within normal limits  COMPREHENSIVE METABOLIC PANEL - Abnormal; Notable for the following components:   Glucose, Bld 163 (*)    Creatinine, Ser 1.04 (*)    GFR calc non Af Amer 58 (*)    All other components within normal limits  GLUCOSE, CAPILLARY - Abnormal; Notable for the following components:   Glucose-Capillary 122 (*)    All other components within normal limits  ETHANOL  PROTIME-INR  APTT  CBC  COOXEMETRY PANEL  URINE DRUG SCREEN, QUALITATIVE ([facility] ONLY)  URINALYSIS, ROUTINE W REFLEX MICROSCOPIC  I-STAT CREATININE, ED   CRITICAL CARE Performed by: Ulice Dash   Total critical care time: 30 minutes  Critical care time was exclusive of separately billable procedures and treating other patients.  Critical care was necessary to treat or prevent imminent or life-threatening deterioration.  Critical care was time spent personally by me on the following activities: development of treatment plan with patient and/or surrogate as well as nursing, discussions with consultants, evaluation of patient's response to treatment, examination of patient, obtaining history from patient or surrogate, ordering and performing treatments and interventions, ordering and review of laboratory studies, ordering and review of radiographic studies, pulse oximetry and re-evaluation of patient's condition.  RADIOLOGY Images were viewed by me  CT head IMPRESSION: 1. Mild asymmetric hypoattenuation within the left lateral frontal lobe and anterior insula in comparison with the right which may represent acute stroke. No hemorrhage or mass effect. 2. Small density within left Sylvian fissure, possible MCA branch thrombus. 3. ASPECTS is 8. 4. Several small chronic infarctions in left parietal cortex.  These results were called by telephone at the time of interpretation on 04/17/2018 at 8:10  pm to Dr. Daryel November , who verbally acknowledged these results. ____________________________________________   DIFFERENTIAL DIAGNOSIS   Anxiety, depression, panic attack, TIA, CVA unlikely  FINAL ASSESSMENT AND PLAN  Acute CVA   Plan: The patient had presented for difficulty speaking.  Initially my examination was completely normal with a NIH stroke score of 0.  Acutely her symptoms worsened with a right-sided hemiplegia.  Code stroke was activated.  Patient's labs did not reveal any acute process. Patient's imaging did reveal left frontal lobe and anterior insula hypoattenuation.  Patient clinically has flaccid right-sided paralysis that spares her forehead.  She is drooling some but maintaining her airway.  She did have one episode of vomiting.  I think she would be a good TPA candidate.  I have ordered TPA in conjunction with the tele-neurologist.  Blood pressure appears to be adequate at this point.   Ulice Dash, MD    Note: This note was generated in part or whole with voice recognition software. Voice recognition is usually quite accurate but there are transcription errors that can and very often do occur. I apologize for any typographical errors that were not detected and corrected.     Emily Filbert, MD 04/17/18 2020

## 2018-04-17 NOTE — ED Notes (Signed)
emtala reviewed by this RN 

## 2018-04-17 NOTE — Anesthesia Procedure Notes (Signed)
Arterial Line Insertion Start/End3/02/2018 10:25 PM, 04/17/2018 10:29 PM Performed by: Edmonia Caprio, CRNA  Patient location: OOR procedure area. Preanesthetic checklist: patient identified, IV checked, monitors and equipment checked and pre-op evaluation Emergency situation radial was placed Catheter size: 20 G  Attempts: 3 Procedure performed without using ultrasound guided technique. Following insertion, dressing applied and Biopatch. Post procedure assessment: unchanged  Patient tolerated the procedure well with no immediate complications. Additional procedure comments: Attempt x2 to left radial, unable to find artery Attempt x1 to right radial, successful.[name]

## 2018-04-17 NOTE — ED Notes (Signed)
Pt unable to sign for consent but pt nodding to Dr. Mayford Knife explanation of transfer.

## 2018-04-17 NOTE — Anesthesia Procedure Notes (Signed)
Procedure Name: Intubation Date/Time: 04/17/2018 10:38 PM Performed by: Edmonia Caprio, CRNA Pre-anesthesia Checklist: Patient identified, Emergency Drugs available, Suction available, Patient being monitored and Timeout performed Patient Re-evaluated:Patient Re-evaluated prior to induction Oxygen Delivery Method: Circle system utilized Preoxygenation: Pre-oxygenation with 100% oxygen Induction Type: IV induction, Rapid sequence and Cricoid Pressure applied Laryngoscope Size: Miller and 2 Grade View: Grade I Tube type: Oral Tube size: 7.5 mm Number of attempts: 1 Airway Equipment and Method: Stylet Placement Confirmation: ETT inserted through vocal cords under direct vision,  positive ETCO2 and breath sounds checked- equal and bilateral Secured at: 22 cm Tube secured with: Tape Dental Injury: Teeth and Oropharynx as per pre-operative assessment

## 2018-04-17 NOTE — H&P (Signed)
Admission H&P    Chief Complaint: Acute onset of dysphasia and right hemiparesis   HPI: Kristin Solis is an 63 y.o. female with a history of asthma and HTN who was emergently transferred from the [facility] ED to [facility] for management of an acute stroke. She received tPA at [facility] prior to transfer.    She was at home in her USOH when at 5:30 PM she developed acute onset of dysphasia and right hemiparesis. This occurred while she was describing to her husband a psychologically traumatic recent event. EMS was called and a Code Stroke was initiated. She was brought to the ED and on arrival her speech was fluent. She was able at that point to relate to ED staff that she had been trying "to tell her husband that a friend of hers daughter had committed suicide last night and I couldn't get the words out." She also stated that she couldn't find her keys and couldn't "make a sentence." Per husband, she also was "slobbering all over herself" at that time. Her initial EDP exam was completely normal per chart. Given full recovery, the Code Stroke was then cancelled. At 7:42 PM she was noted to be having difficulty speaking again, in conjunction with dense right hemiparesis. Code Stroke was reactivated.   CT head showed mild asymmetric hypoattenuation within the left lateral frontal lobe and anterior insula in comparison with the right which was felt possibly to represent acute stroke. Also noted was a small density within left Sylvian fissure, possible MCA branch thrombus.   Teleneurology consult was obtained and she was deemed to be a candidate for tPA. After tPA infusion was started, a STAT CTA of head and neck with perfusion study was obtained, with results as follows:  CT brain perfusion: Left MCA distribution large ischemic penumbra, 77 cc. Left frontal white matter and insula core infarct of 11 cc. ASPECTS 8 infarct on noncontrast CT of head with possible pseudonormalization of perfusion.  CTA head: 1. Left  M1 occlusion.  Poor left MCA distribution collateralization. 2. No additional intracranial large vessel occlusion, high-grade stenosis, aneurysm, or vascular malformation.  CTA neck: 1. Left proximal ICA thromboembolism. The ICA downstream to the thrombosis is opacified with contrast suggesting that the thrombus is near occlusive, but not totally occlusive. 2. Patent right carotid system and bilateral vertebral arteries without hemodynamically significant stenosis by NASCET criteria, dissection, or aneurysm.  Review of images by Neurohospitalist also revealed findings suggestive of a left M2 occlusion with poor collateral flow distally.   The patient was emergently transported to [facility] for VIR after discussion with Dr. Estanislado Pandy. mRS is 0.   LSN: 7:30 PM tPA Given: Yes, at OSH   Past Medical History:  Diagnosis Date  . Asthma   . Hypertension     Past Surgical History:  Procedure Laterality Date  . ABDOMINAL HYSTERECTOMY  2005   total  . OOPHORECTOMY      Family History  Problem Relation Age of Onset  . Cancer Mother        lung  . Cancer Father        brain  . Diabetes Sister   . Alcohol abuse Brother   . Stroke Son        x2  . CAD Maternal Grandmother   . Emphysema Paternal Grandfather   . Heart disease Sister        CHF   Social History:  reports that she has never smoked. She has never used smokeless tobacco.  She reports that she does not drink alcohol or use drugs.  Allergies: No Known Allergies   Home medications: Albuterol Flonase Advair-Diskus Advil Prinizide Singulair Prednisone 50 mg po qd   ROS: Unable to obtain due to aphasia  Physical Examination: Blood pressure (!) 107/93, pulse (!) 56, temperature (!) 97.5 F (36.4 C), temperature source Temporal, resp. rate 12, SpO2 100 %.  HEENT-  [place]/AT  Lungs - Respirations unlabored Extremities - Warm and well perfused. No edema  Neurologic Examination: Ment: Decreased level of  alertness/altered sensorium. Dense receptive and expressive aphasia. Right hemineglect.  CN: PERRL. Does not blink to threat on the right or left. Will move eyes sluggishly away from bright light stimulus. Eyes deviated conjugately to the left without nystagmus. Can overcome with oculocephalic maneuver. Grimaces with left brow ridge pressure but not right. Prominent right facial droop. Does not respond to vocal stimuli. Unable to visualize palate. Head rotated to the left.  Motor/Sensory:  LUE and LLE move purposefully to noxious stimuli with 5/5 strength. RUE 0/5 with no movement to any stimulus RLE with weak triple flexion response and internal rotation at the thigh to plantar stimulation. Reflexes: Normoactive on the left, hypoactive on the right. Right toe briskly upgoing, left toe upgoing but less brisk than on the right Cerebellar/Gait: Unable to assess.    Results for orders placed or performed during the hospital encounter of 04/17/18 (from the past 48 hour(s))  Ethanol     Status: None   Collection Time: 04/17/18  7:18 PM  Result Value Ref Range   Alcohol, Ethyl (B) <10 <10 mg/dL    Comment: (NOTE) Lowest detectable limit for serum alcohol is 10 mg/dL. For medical purposes only. Performed at [facility], [address] [place], [place] [address]   Protime-INR     Status: None   Collection Time: 04/17/18  7:18 PM  Result Value Ref Range   Prothrombin Time 13.0 11.4 - 15.2 seconds   INR 1.0 0.8 - 1.2    Comment: (NOTE) INR goal varies based on device and disease states. Performed at [facility], [address] [place], [place] [address]   APTT     Status: None   Collection Time: 04/17/18  7:18 PM  Result Value Ref Range   aPTT 27 24 - 36 seconds    Comment: Performed at [facility], [address] [place], [place] [address]  CBC     Status: None   Collection Time: 04/17/18  7:18 PM  Result Value Ref Range   WBC 9.9 4.0 - 10.5 K/uL    RBC 4.91 3.87 - 5.11 MIL/uL   Hemoglobin 14.1 12.0 - 15.0 g/dL   HCT 43.9 36.0 - 46.0 %   MCV 89.4 80.0 - 100.0 fL   MCH 28.7 26.0 - 34.0 pg   MCHC 32.1 30.0 - 36.0 g/dL   RDW 13.2 11.5 - 15.5 %   Platelets 331 150 - 400 K/uL   nRBC 0.0 0.0 - 0.2 %    Comment: Performed at [facility], [address] [place],  [address]  Differential     Status: Abnormal   Collection Time: 04/17/18  7:18 PM  Result Value Ref Range   Neutrophils Relative % 63 %   Neutro Abs 6.3 1.7 - 7.7 K/uL   Lymphocytes Relative 26 %   Lymphs Abs 2.5 0.7 - 4.0 K/uL   Monocytes Relative 8 %   Monocytes Absolute 0.7 0.1 - 1.0 K/uL  Eosinophils Relative 2 %   Eosinophils Absolute 0.2 0.0 - 0.5 K/uL   Basophils Relative 0 %   Basophils Absolute 0.0 0.0 - 0.1 K/uL   Immature Granulocytes 1 %   Abs Immature Granulocytes 0.10 (H) 0.00 - 0.07 K/uL    Comment: Performed at [facility], [address] [place], [place] [address]  Comprehensive metabolic panel     Status: Abnormal   Collection Time: 04/17/18  7:18 PM  Result Value Ref Range   Sodium 139 135 - 145 mmol/L   Potassium 3.5 3.5 - 5.1 mmol/L   Chloride 101 98 - 111 mmol/L   CO2 27 22 - 32 mmol/L   Glucose, Bld 163 (H) 70 - 99 mg/dL   BUN 22 8 - 23 mg/dL   Creatinine, Ser 1.04 (H) 0.44 - 1.00 mg/dL   Calcium 8.9 8.9 - 10.3 mg/dL   Total Protein 6.7 6.5 - 8.1 g/dL   Albumin 3.7 3.5 - 5.0 g/dL   AST 19 15 - 41 U/L   ALT 22 0 - 44 U/L   Alkaline Phosphatase 77 38 - 126 U/L   Total Bilirubin 0.6 0.3 - 1.2 mg/dL   GFR calc non Af Amer 58 (L) >60 mL/min   GFR calc Af Amer >60 >60 mL/min   Anion gap 11 5 - 15    Comment: Performed at [facility], [address] [place],  [address]  Cooxemetry Panel, hospital-performed (carboxy, met, total hgb, O2 sat)     Status: None   Collection Time: 04/17/18  7:44 PM  Result Value Ref Range   Total hemoglobin 14.0 12.0 - 16.0 g/dL   O2 Saturation 41.8 %    Carboxyhemoglobin 0.8 0.5 - 1.5 %   Methemoglobin 0.7 0.0 - 1.5 %   Total oxygen content 41.2 mL/dL    Comment: Performed at [facility], [address] [place], [place] [address]  Glucose, capillary     Status: Abnormal   Collection Time: 04/17/18  8:09 PM  Result Value Ref Range   Glucose-Capillary 122 (H) 70 - 99 mg/dL   Ct Angio Head W Or Wo Contrast  Result Date: 04/17/2018 EXAM: CT ANGIOGRAPHY HEAD AND NECK CT PERFUSION BRAIN TECHNIQUE: Multidetector CT imaging of the head and neck was performed using the standard protocol during bolus administration of intravenous contrast. Multiplanar CT image reconstructions and MIPs were obtained to evaluate the vascular anatomy. Carotid stenosis measurements (when applicable) are obtained utilizing NASCET criteria, using the distal internal carotid diameter as the denominator. Multiphase CT imaging of the brain was performed following IV bolus contrast injection. Subsequent parametric perfusion maps were calculated using RAPID software. CONTRAST:  169m ISOVUE-370 IOPAMIDOL (ISOVUE-370) INJECTION 76% COMPARISON:  None. FINDINGS: CTA NECK FINDINGS Aortic arch: Standard branching. Imaged portion shows no evidence of aneurysm or dissection. No significant stenosis of the major arch vessel origins. Right carotid system: No evidence of dissection, stenosis (50% or greater) or occlusion. Non stenotic calcific atherosclerosis of the carotid bifurcation. Left carotid system: Left proximal ICA thromboembolism. The ICA downstream to the thrombosis is opacified with contrast suggesting that the thrombus is near occlusive but not totally occlusive. Vertebral arteries: Left dominant. No evidence of dissection, stenosis (50% or greater) or occlusion. Skeleton: No acute finding. Other neck: 7 mm nodule within the left lobe of the thyroid gland. Upper chest: Negative. Review of the MIP images confirms the above findings CTA HEAD FINDINGS Anterior circulation: Non  stenotic calcific atherosclerosis of the  carotid siphons. Large right A1, large anterior communicating artery, diminutive left A1, normal variant. Patent bilateral ACA and right MCA distributions. Left M1 occlusion with poor left MCA distribution collateralization. Posterior circulation: No significant stenosis, proximal occlusion, aneurysm, or vascular malformation. Venous sinuses: As permitted by contrast timing, patent. Anatomic variants: None significant. Review of the MIP images confirms the above findings CT Brain Perfusion Findings: CBF (<30%) Volume: 28m Perfusion (Tmax>6.0s) volume: 738mMismatch Volume: 6666mnfarction Location:Left MCA distribution, ASPECTS 8 stroke on noncontrast CT. IMPRESSION: CT brain perfusion: Left MCA distribution large ischemic penumbra, 77 cc. Left frontal white matter and insula core infarct of 11 cc. ASPECTS 8 infarct on noncontrast CT of head with possible pseudonormalization of perfusion. CTA head: 1. Left M1 occlusion.  Poor left MCA distribution collateralization. 2. No additional intracranial large vessel occlusion, high-grade stenosis, aneurysm, or vascular malformation. CTA neck: 1. Left proximal ICA thromboembolism. The ICA downstream to the thrombosis is opacified with contrast suggesting that the thrombus is near occlusive, but not totally occlusive. 2. Patent right carotid system and bilateral vertebral arteries without hemodynamically significant stenosis by NASCET criteria, dissection, or aneurysm. These results were called by telephone at the time of interpretation on 04/17/2018 at 9:24 pm to Dr. JonLenise Arenawho verbally acknowledged these results. Electronically Signed   By: LanKristine GarbeD.   On: 04/17/2018 21:35   Dg Chest 1 View  Result Date: 04/17/2018 CLINICAL DATA:  Right-sided weakness.  Asthma and hypertension EXAM: CHEST  1 VIEW COMPARISON:  None. FINDINGS: The heart size and mediastinal contours are within normal limits. Both  lungs are clear. The visualized skeletal structures are unremarkable. IMPRESSION: No active disease. Electronically Signed   By: TayKerby MoorsD.   On: 04/17/2018 21:58   Ct Angio Neck W Or Wo Contrast  Result Date: 04/17/2018 EXAM: CT ANGIOGRAPHY HEAD AND NECK CT PERFUSION BRAIN TECHNIQUE: Multidetector CT imaging of the head and neck was performed using the standard protocol during bolus administration of intravenous contrast. Multiplanar CT image reconstructions and MIPs were obtained to evaluate the vascular anatomy. Carotid stenosis measurements (when applicable) are obtained utilizing NASCET criteria, using the distal internal carotid diameter as the denominator. Multiphase CT imaging of the brain was performed following IV bolus contrast injection. Subsequent parametric perfusion maps were calculated using RAPID software. CONTRAST:  150m67mOVUE-370 IOPAMIDOL (ISOVUE-370) INJECTION 76% COMPARISON:  None. FINDINGS: CTA NECK FINDINGS Aortic arch: Standard branching. Imaged portion shows no evidence of aneurysm or dissection. No significant stenosis of the major arch vessel origins. Right carotid system: No evidence of dissection, stenosis (50% or greater) or occlusion. Non stenotic calcific atherosclerosis of the carotid bifurcation. Left carotid system: Left proximal ICA thromboembolism. The ICA downstream to the thrombosis is opacified with contrast suggesting that the thrombus is near occlusive but not totally occlusive. Vertebral arteries: Left dominant. No evidence of dissection, stenosis (50% or greater) or occlusion. Skeleton: No acute finding. Other neck: 7 mm nodule within the left lobe of the thyroid gland. Upper chest: Negative. Review of the MIP images confirms the above findings CTA HEAD FINDINGS Anterior circulation: Non stenotic calcific atherosclerosis of the carotid siphons. Large right A1, large anterior communicating artery, diminutive left A1, normal variant. Patent bilateral ACA and  right MCA distributions. Left M1 occlusion with poor left MCA distribution collateralization. Posterior circulation: No significant stenosis, proximal occlusion, aneurysm, or vascular malformation. Venous sinuses: As permitted by contrast timing, patent. Anatomic variants: None significant. Review of the MIP images confirms the  above findings CT Brain Perfusion Findings: CBF (<30%) Volume: 6m Perfusion (Tmax>6.0s) volume: 774mMismatch Volume: 66105mnfarction Location:Left MCA distribution, ASPECTS 8 stroke on noncontrast CT. IMPRESSION: CT brain perfusion: Left MCA distribution large ischemic penumbra, 77 cc. Left frontal white matter and insula core infarct of 11 cc. ASPECTS 8 infarct on noncontrast CT of head with possible pseudonormalization of perfusion. CTA head: 1. Left M1 occlusion.  Poor left MCA distribution collateralization. 2. No additional intracranial large vessel occlusion, high-grade stenosis, aneurysm, or vascular malformation. CTA neck: 1. Left proximal ICA thromboembolism. The ICA downstream to the thrombosis is opacified with contrast suggesting that the thrombus is near occlusive, but not totally occlusive. 2. Patent right carotid system and bilateral vertebral arteries without hemodynamically significant stenosis by NASCET criteria, dissection, or aneurysm. These results were called by telephone at the time of interpretation on 04/17/2018 at 9:24 pm to Dr. JonLenise Arenawho verbally acknowledged these results. Electronically Signed   By: LanKristine GarbeD.   On: 04/17/2018 21:35   Ct Cerebral Perfusion W Contrast  Result Date: 04/17/2018 EXAM: CT ANGIOGRAPHY HEAD AND NECK CT PERFUSION BRAIN TECHNIQUE: Multidetector CT imaging of the head and neck was performed using the standard protocol during bolus administration of intravenous contrast. Multiplanar CT image reconstructions and MIPs were obtained to evaluate the vascular anatomy. Carotid stenosis measurements (when  applicable) are obtained utilizing NASCET criteria, using the distal internal carotid diameter as the denominator. Multiphase CT imaging of the brain was performed following IV bolus contrast injection. Subsequent parametric perfusion maps were calculated using RAPID software. CONTRAST:  150m33mOVUE-370 IOPAMIDOL (ISOVUE-370) INJECTION 76% COMPARISON:  None. FINDINGS: CTA NECK FINDINGS Aortic arch: Standard branching. Imaged portion shows no evidence of aneurysm or dissection. No significant stenosis of the major arch vessel origins. Right carotid system: No evidence of dissection, stenosis (50% or greater) or occlusion. Non stenotic calcific atherosclerosis of the carotid bifurcation. Left carotid system: Left proximal ICA thromboembolism. The ICA downstream to the thrombosis is opacified with contrast suggesting that the thrombus is near occlusive but not totally occlusive. Vertebral arteries: Left dominant. No evidence of dissection, stenosis (50% or greater) or occlusion. Skeleton: No acute finding. Other neck: 7 mm nodule within the left lobe of the thyroid gland. Upper chest: Negative. Review of the MIP images confirms the above findings CTA HEAD FINDINGS Anterior circulation: Non stenotic calcific atherosclerosis of the carotid siphons. Large right A1, large anterior communicating artery, diminutive left A1, normal variant. Patent bilateral ACA and right MCA distributions. Left M1 occlusion with poor left MCA distribution collateralization. Posterior circulation: No significant stenosis, proximal occlusion, aneurysm, or vascular malformation. Venous sinuses: As permitted by contrast timing, patent. Anatomic variants: None significant. Review of the MIP images confirms the above findings CT Brain Perfusion Findings: CBF (<30%) Volume: 11mL58mfusion (Tmax>6.0s) volume: 77mL 39match Volume: 66mL I78mction Location:Left MCA distribution, ASPECTS 8 stroke on noncontrast CT. IMPRESSION: CT brain perfusion: Left  MCA distribution large ischemic penumbra, 77 cc. Left frontal white matter and insula core infarct of 11 cc. ASPECTS 8 infarct on noncontrast CT of head with possible pseudonormalization of perfusion. CTA head: 1. Left M1 occlusion.  Poor left MCA distribution collateralization. 2. No additional intracranial large vessel occlusion, high-grade stenosis, aneurysm, or vascular malformation. CTA neck: 1. Left proximal ICA thromboembolism. The ICA downstream to the thrombosis is opacified with contrast suggesting that the thrombus is near occlusive, but not totally occlusive. 2. Patent right carotid system and bilateral vertebral arteries without hemodynamically significant  stenosis by NASCET criteria, dissection, or aneurysm. These results were called by telephone at the time of interpretation on 04/17/2018 at 9:24 pm to Dr. Lenise Arena , who verbally acknowledged these results. Electronically Signed   By: Kristine Garbe M.D.   On: 04/17/2018 21:35   Ct Head Code Stroke Wo Contrast`  Result Date: 04/17/2018 CLINICAL DATA:  Code stroke. 63 y/o F; transient episode of word-finding difficulty with recurrent symptoms including weakness of right arm and leg. EXAM: CT HEAD WITHOUT CONTRAST TECHNIQUE: Contiguous axial images were obtained from the base of the skull through the vertex without intravenous contrast. COMPARISON:  None. FINDINGS: Brain: Mild asymmetric hypoattenuation within the left lateral frontal lobe and anterior insula in comparison with the right which may represent acute stroke. ASPECTS is 8. No hemorrhage or mass effect. Several small chronic infarctions are present within the left parietal cortex. No extra-axial collection, hydrocephalus, or herniation. Vascular: Small density within left sylvian fissure may represent a MCA branch thrombus (series 6, image 11). Skull: Normal. Negative for fracture or focal lesion. Sinuses/Orbits: No acute finding. Other: None. ASPECTS [facility] Stroke  Program Early CT Score) - Ganglionic level infarction (caudate, lentiform nuclei, internal capsule, insula, M1-M3 cortex): 5 - Supraganglionic infarction (M4-M6 cortex): 3 Total score (0-10 with 10 being normal): 8 IMPRESSION: 1. Mild asymmetric hypoattenuation within the left lateral frontal lobe and anterior insula in comparison with the right which may represent acute stroke. No hemorrhage or mass effect. 2. Small density within left Sylvian fissure, possible MCA branch thrombus. 3. ASPECTS is 8. 4. Several small chronic infarctions in left parietal cortex. These results were called by telephone at the time of interpretation on 04/17/2018 at 8:10 pm to Dr. Lenise Arena , who verbally acknowledged these results. Electronically Signed   By: Kristine Garbe M.D.   On: 04/17/2018 20:12    Assessment: 63 y.o. female with acute left MCA stroke secondary to tandem ICA bulb and M2 occlusions.  1. The patient was administered tPA at [facility] 2. She is a candidate for thrombectomy. Family was preliminarily consented prior to transport to [facility] 3. Stroke Risk Factors - HTN 4. History of asthma, on multiple medications for this, including 50 mg po qd of prednisone  Plan: 1. Admitting to Neuro ICU following VIR.   2. Post-tPA and post-VIR order set to include frequent neuro checks and BP management.  3. No antiplatelet medications or anticoagulants for at least 24 hours following tPA.  4. DVT prophylaxis with SCDs.  5. Will need to be started on a statin.  6. Will need to be started on antiplatelet therapy if follow up CT at 24 hours is negative for hemorrhagic conversion. 7. Carotid ultrasound to further evaluate left ICA atherosclerotic narrowing. May be candidate for CEA.  8. TTE.  9. MRI brain.  10. PT/OT/Speech.  11. NPO until passes swallow evaluation.  12. Telemetry monitoring 13. Fasting lipid panel, HgbA1c 14. Continue her asthma medications, including prednisone 50 mg po qd  Total time  spent in the emergent neurological evaluation and management of this acute stroke patient was 85 minutes. Time spent included examination, review of imaging studies, discussion of risks/benefits of thrombectomy with patient's husband and coordination of care.    Electronically signed: Dr. Kerney Elbe 04/17/2018, 10:35 PM

## 2018-04-18 ENCOUNTER — Encounter (HOSPITAL_COMMUNITY): Payer: Self-pay | Admitting: Interventional Radiology

## 2018-04-18 ENCOUNTER — Inpatient Hospital Stay (HOSPITAL_COMMUNITY): Payer: BLUE CROSS/BLUE SHIELD

## 2018-04-18 DIAGNOSIS — I639 Cerebral infarction, unspecified: Secondary | ICD-10-CM

## 2018-04-18 DIAGNOSIS — I6602 Occlusion and stenosis of left middle cerebral artery: Secondary | ICD-10-CM | POA: Diagnosis present

## 2018-04-18 HISTORY — PX: IR INTRAVSC STENT CERV CAROTID W/O EMB-PROT MOD SED INC ANGIO: IMG2304

## 2018-04-18 HISTORY — PX: IR CT HEAD LTD: IMG2386

## 2018-04-18 HISTORY — PX: IR PERCUTANEOUS ART THROMBECTOMY/INFUSION INTRACRANIAL INC DIAG ANGIO: IMG6087

## 2018-04-18 LAB — CBC WITH DIFFERENTIAL/PLATELET
Abs Immature Granulocytes: 0.1 10*3/uL — ABNORMAL HIGH (ref 0.00–0.07)
Basophils Absolute: 0 10*3/uL (ref 0.0–0.1)
Basophils Relative: 0 %
Eosinophils Absolute: 0 10*3/uL (ref 0.0–0.5)
Eosinophils Relative: 0 %
HCT: 40.6 % (ref 36.0–46.0)
Hemoglobin: 13.1 g/dL (ref 12.0–15.0)
Immature Granulocytes: 1 %
LYMPHS PCT: 4 %
Lymphs Abs: 0.5 10*3/uL — ABNORMAL LOW (ref 0.7–4.0)
MCH: 28.5 pg (ref 26.0–34.0)
MCHC: 32.3 g/dL (ref 30.0–36.0)
MCV: 88.3 fL (ref 80.0–100.0)
Monocytes Absolute: 0.2 10*3/uL (ref 0.1–1.0)
Monocytes Relative: 2 %
Neutro Abs: 12 10*3/uL — ABNORMAL HIGH (ref 1.7–7.7)
Neutrophils Relative %: 93 %
Platelets: 327 10*3/uL (ref 150–400)
RBC: 4.6 MIL/uL (ref 3.87–5.11)
RDW: 13.2 % (ref 11.5–15.5)
WBC: 12.9 10*3/uL — ABNORMAL HIGH (ref 4.0–10.5)
nRBC: 0 % (ref 0.0–0.2)

## 2018-04-18 LAB — BASIC METABOLIC PANEL
Anion gap: 7 (ref 5–15)
BUN: 16 mg/dL (ref 8–23)
CO2: 23 mmol/L (ref 22–32)
Calcium: 8.3 mg/dL — ABNORMAL LOW (ref 8.9–10.3)
Chloride: 106 mmol/L (ref 98–111)
Creatinine, Ser: 0.89 mg/dL (ref 0.44–1.00)
GFR calc Af Amer: >60 mL/min (ref >60)
GFR calc non Af Amer: >60 mL/min (ref >60)
Glucose, Bld: 162 mg/dL — ABNORMAL HIGH (ref 70–99)
Potassium: 3.3 mmol/L — ABNORMAL LOW (ref 3.5–5.1)
SODIUM: 136 mmol/L (ref 135–145)

## 2018-04-18 LAB — TRIGLYCERIDES: Triglycerides: 97 mg/dL (ref <150)

## 2018-04-18 LAB — LIPID PANEL
Cholesterol: 186 mg/dL (ref 0–200)
HDL: 66 mg/dL (ref >40)
LDL Cholesterol: 102 mg/dL — ABNORMAL HIGH (ref 0–99)
Total CHOL/HDL Ratio: 2.8 RATIO
Triglycerides: 90 mg/dL (ref <150)
VLDL: 18 mg/dL (ref 0–40)

## 2018-04-18 LAB — ECHOCARDIOGRAM COMPLETE
Height: 63 in
Weight: 3952 oz

## 2018-04-18 LAB — HEMOGLOBIN A1C
Hgb A1c MFr Bld: 5.7 % — ABNORMAL HIGH (ref 4.8–5.6)
Mean Plasma Glucose: 116.89 mg/dL

## 2018-04-18 LAB — MRSA PCR SCREENING: MRSA by PCR: NEGATIVE

## 2018-04-18 LAB — HIV ANTIBODY (ROUTINE TESTING W REFLEX): HIV Screen 4th Generation wRfx: NONREACTIVE

## 2018-04-18 MED ORDER — ASPIRIN 81 MG PO CHEW
81.0000 mg | CHEWABLE_TABLET | Freq: Every day | ORAL | Status: DC
  Administered 2018-04-18 – 2018-04-20 (×3): 81 mg
  Filled 2018-04-18 (×3): qty 1

## 2018-04-18 MED ORDER — SODIUM CHLORIDE 0.9 % IV BOLUS
1000.0000 mL | Freq: Once | INTRAVENOUS | Status: AC
  Administered 2018-04-18: 1000 mL via INTRAVENOUS

## 2018-04-18 MED ORDER — LISINOPRIL 10 MG PO TABS
10.0000 mg | ORAL_TABLET | Freq: Every day | ORAL | Status: DC
  Administered 2018-04-18 – 2018-04-21 (×2): 10 mg
  Filled 2018-04-18: qty 1

## 2018-04-18 MED ORDER — ACETAMINOPHEN 325 MG PO TABS
650.0000 mg | ORAL_TABLET | ORAL | Status: DC | PRN
  Administered 2018-04-21: 650 mg via ORAL

## 2018-04-18 MED ORDER — SODIUM CHLORIDE 0.9 % IV SOLN
INTRAVENOUS | Status: DC
  Administered 2018-04-18: 03:00:00 via INTRAVENOUS

## 2018-04-18 MED ORDER — ORAL CARE MOUTH RINSE
15.0000 mL | OROMUCOSAL | Status: DC
  Administered 2018-04-18 – 2018-04-19 (×9): 15 mL via OROMUCOSAL

## 2018-04-18 MED ORDER — HYDROCHLOROTHIAZIDE 12.5 MG PO CAPS
12.5000 mg | ORAL_CAPSULE | Freq: Every day | ORAL | Status: DC
  Filled 2018-04-18: qty 1

## 2018-04-18 MED ORDER — ACETAMINOPHEN 650 MG RE SUPP: 650.0000 mg | RECTAL | Status: DC | PRN

## 2018-04-18 MED ORDER — LABETALOL HCL 5 MG/ML IV SOLN: 10.0000 mg | INTRAVENOUS | Status: DC | PRN

## 2018-04-18 MED ORDER — LISINOPRIL 10 MG PO TABS
10.0000 mg | ORAL_TABLET | Freq: Every day | ORAL | Status: DC
  Filled 2018-04-18: qty 1

## 2018-04-18 MED ORDER — TICAGRELOR 90 MG PO TABS
90.0000 mg | ORAL_TABLET | Freq: Two times a day (BID) | ORAL | Status: DC
  Administered 2018-04-20 – 2018-04-21 (×2): 90 mg via ORAL
  Filled 2018-04-18 (×3): qty 1

## 2018-04-18 MED ORDER — CLEVIDIPINE BUTYRATE 0.5 MG/ML IV EMUL
0.0000 mg/h | INTRAVENOUS | Status: DC
  Administered 2018-04-18: 10 mg/h via INTRAVENOUS
  Administered 2018-04-18: 6 mg/h via INTRAVENOUS
  Administered 2018-04-18: 2 mg/h via INTRAVENOUS
  Filled 2018-04-18: qty 100
  Filled 2018-04-18 (×2): qty 50

## 2018-04-18 MED ORDER — HYDROCHLOROTHIAZIDE 12.5 MG PO CAPS
12.5000 mg | ORAL_CAPSULE | Freq: Every day | ORAL | Status: DC
  Administered 2018-04-18 – 2018-04-21 (×2): 12.5 mg via ORAL
  Filled 2018-04-18: qty 1

## 2018-04-18 MED ORDER — PROPOFOL 1000 MG/100ML IV EMUL
5.0000 ug/kg/min | INTRAVENOUS | Status: DC
  Administered 2018-04-18 (×2): 40 ug/kg/min via INTRAVENOUS
  Administered 2018-04-18: 45 ug/kg/min via INTRAVENOUS
  Administered 2018-04-18: 70 ug/kg/min via INTRAVENOUS
  Filled 2018-04-18 (×5): qty 100

## 2018-04-18 MED ORDER — PROPOFOL 500 MG/50ML IV EMUL
INTRAVENOUS | Status: DC | PRN
  Administered 2018-04-18: 75 ug/kg/min via INTRAVENOUS

## 2018-04-18 MED ORDER — TICAGRELOR 90 MG PO TABS
90.0000 mg | ORAL_TABLET | Freq: Two times a day (BID) | ORAL | Status: DC
  Administered 2018-04-18 – 2018-04-21 (×6): 90 mg
  Filled 2018-04-18 (×6): qty 1

## 2018-04-18 MED ORDER — ASPIRIN 81 MG PO CHEW
81.0000 mg | CHEWABLE_TABLET | Freq: Every day | ORAL | Status: DC
  Administered 2018-04-21: 81 mg via ORAL
  Filled 2018-04-18: qty 1

## 2018-04-18 MED ORDER — CHLORHEXIDINE GLUCONATE 0.12% ORAL RINSE (MEDLINE KIT)
15.0000 mL | Freq: Two times a day (BID) | OROMUCOSAL | Status: DC
  Administered 2018-04-18 – 2018-04-19 (×2): 15 mL via OROMUCOSAL

## 2018-04-18 MED ORDER — ONDANSETRON HCL 4 MG/2ML IJ SOLN
INTRAMUSCULAR | Status: DC | PRN
  Administered 2018-04-18: 4 mg via INTRAVENOUS

## 2018-04-18 MED ORDER — ACETAMINOPHEN 160 MG/5ML PO SOLN
650.0000 mg | ORAL | Status: DC | PRN
  Administered 2018-04-20: 650 mg

## 2018-04-18 MED ORDER — HYDRALAZINE HCL 20 MG/ML IJ SOLN: 10.0000 mg | INTRAMUSCULAR | Status: DC | PRN

## 2018-04-18 NOTE — Transfer of Care (Signed)
Immediate Anesthesia Transfer of Care Note  Patient: Kristin Solis  Procedure(s) Performed: RADIOLOGY WITH ANESTHESIA (N/A )  Patient Location: ICU  Anesthesia Type:General  Level of Consciousness: sedated and Patient remains intubated per anesthesia plan  Airway & Oxygen Therapy: Patient remains intubated per anesthesia plan and Patient placed on Ventilator (see vital sign flow sheet for setting)  Post-op Assessment: Report given to RN and Post -op Vital signs reviewed and stable  Post vital signs: Reviewed and stable  Last Vitals:  Vitals Value Taken Time  BP    Temp    Pulse    Resp    SpO2      Last Pain:  Vitals:   04/17/18 2208  TempSrc: Temporal  PainSc: 0-No pain         Complications: No apparent anesthesia complications

## 2018-04-18 NOTE — Progress Notes (Signed)
PT Cancellation Note  Patient Details Name: Kristin Solis MRN: [id number] DOB: 03-10-1955   Cancelled Treatment:    Reason Eval/Treat Not Completed: Patient not medically ready. Pt remains intubated, sedated, and on propofol. RN reports she is weaning off sedated and she is starting to use R UE. Acute PT to return as able, as appropriate to complete PT eval.  Lewis Shock, PT, DPT Acute Rehabilitation Services Pager #: [phone] Office #: [phone]    Iona Hansen 04/18/2018, 8:11 AM

## 2018-04-18 NOTE — Progress Notes (Addendum)
STROKE TEAM PROGRESS NOTE   INTERVAL HISTORY Her son is at the bedside.  She remains intubated in trendelenburg this am.  Weaning underway.   Vitals:   04/18/18 0700 04/18/18 0715 04/18/18 0730 04/18/18 0745  BP: (!) 104/56 (!) 104/56 116/63 (!) 110/55  Pulse: 65 66 72 73  Resp: 16 16 20 19   Temp:      TempSrc:      SpO2: 97% 97% 97% 98%    CBC:  Recent Labs  Lab 04/17/18 1918 04/18/18 0454  WBC 9.9 12.9*  NEUTROABS 6.3 12.0*  HGB 14.1 13.1  HCT 43.9 40.6  MCV 89.4 88.3  PLT 331 119    Basic Metabolic Panel:  Recent Labs  Lab 04/17/18 1918 04/18/18 0454  NA 139 136  K 3.5 3.3*  CL 101 106  CO2 27 23  GLUCOSE 163* 162*  BUN 22 16  CREATININE 1.04* 0.89  CALCIUM 8.9 8.3*   Lipid Panel:     Component Value Date/Time   CHOL 186 04/18/2018 0454   CHOL 196 03/26/2017 1437   TRIG 90 04/18/2018 0454   TRIG 97 04/18/2018 0454   HDL 66 04/18/2018 0454   HDL 73 03/26/2017 1437   CHOLHDL 2.8 04/18/2018 0454   VLDL 18 04/18/2018 0454   LDLCALC 102 (H) 04/18/2018 0454   LDLCALC 107 (H) 03/26/2017 1437   HgbA1c:  Lab Results  Component Value Date   HGBA1C 5.7 (H) 04/18/2018   Urine Drug Screen: No results found for: LABOPIA, COCAINSCRNUR, LABBENZ, AMPHETMU, THCU, LABBARB  Alcohol Level     Component Value Date/Time   ETH <10 04/17/2018 1918    IMAGING Ct Head Code Stroke Wo Contrast` 04/17/2018 1. Mild asymmetric hypoattenuation within the left lateral frontal lobe and anterior insula in comparison with the right which may represent acute stroke. No hemorrhage or mass effect. 2. Small density within left Sylvian fissure, possible MCA branch thrombus. 3. ASPECTS is 8. 4. Several small chronic infarctions in left parietal cortex.   CTA head 04/17/2018 1. Left M1 occlusion.  Poor left MCA distribution collateralization. 2. No additional intracranial large vessel occlusion, high-grade stenosis, aneurysm, or vascular malformation.   CTA neck 04/17/2018 1. Left  proximal ICA thromboembolism. The ICA downstream to the thrombosis is opacified with contrast suggesting that the thrombus is near occlusive, but not totally occlusive. 2. Patent right carotid system and bilateral vertebral arteries without hemodynamically significant stenosis   Ct Cerebral Perfusion W Contrast 04/17/2018 Left MCA distribution large ischemic penumbra, 77 cc. Left frontal white matter and insula core infarct of 11 cc. ASPECTS 8 infarct on noncontrast CT of head with possible pseudonormalization of perfusion.   Cerebral angiogram S/P lt common carotid arteriogram foll;owed by endovascular complete revascularization of occluded lt mac,Lt ACA aand Lt ICA terminus with x 1 pass with 5 mm x 33 mm embotrap retriver device achieving a TICI 3 revascularization. S/P stent assisted angioplasty of  severely stenotic Lt ICA at the bulb . Patient loaded with 180 m8 of brilinta and 81 mg of aspirin . Also received 3 mg of IA integrelin.  CT head 04/18/2018 pending   MRI brain 04/18/2018 pending   Dg Chest 1 View 04/18/2018 1. Endotracheal tube tip just above the carina. Esophageal tube tip projects over the gastric fundal region and is directed cephalad near the GE junction 2. Streaky left perihilar and basilar atelectasis. Borderline cardiomegaly   Dg Chest 1 View 04/17/2018 No active disease.     PHYSICAL EXAM  Obese middle-aged Caucasian lady who is intubated and sedated. . Afebrile. Head is nontraumatic. Neck is supple without bruit.    Cardiac exam no murmur or gallop. Lungs are clear to auscultation. Distal pulses are well felt. Neurological Exam :  Sedated intubated. Eyes are closed. I then primary position. Dolls eye movements are sluggish. Pupils equal reactive. Fundi not visualized. She is aphasic and does not follow commands. Tongue is midline. Motor system exam shows spontaneous left upper and lower extremity weakness. Right upper extremity is flaccid and plegic with no response to  pain. There is mild withdrawal in right lower extremity to painful stimuli but it appears weak compared to the left side. Tone is diminished on the right. Deep tendon reflexes are depressed on the right compared to the left. Right plantar is upgoing left is downgoing.  ASSESSMENT/PLAN [name] is a 63 y.o. female with history of asthma and HTN presenting to [facility] with dysphasia and R HP which occurred while talking about a traumatic event.  The event totally resolved.  While in the ED, she began having difficulty speaking again along with dense right hemiparesis.  She received tPA 04/17/2018 at 2032. CTA showed a dense LM1 occlusion with large ischemic penumbra on perfusion and CT a neck showed a left ICA embolus.  She was transferred to [facility] and sent to IR for mechanical thrombectomy where she had TICI 3 reperfusion using ambo trap and stent assisted angioplasty of severely stenotic L ICA at the bulb.  Stroke:  left MCA infarct s/p tPA and IR with ICA stent placement, infarct likely d/t large vessel disease source  Code Stroke CT head mild asymmetric hypoattenuation within the left lateral frontal lobe and anterior insula.  Small density left sylvian fissure possible MCA branch thrombus.  Several small chronic infarcts left parietal cortex.  ASPECTS 18    CTA head L M1 occlusion.  Poor L MCA distribution collateralization.  CTA neck left proximal ICA thromboembolism with downstream opacified digesting thrombus is near occlusive.  CT perfusion left MCA large ischemic penumbra 7 the 7 cc.  Left frontal white matter and insula core infarct 11 cc.  Angio occluded L MCA, L ACA and L ICA terminus with x 1 pass embotrap w/ TICI 3 revascularization. stent assisted angioplasty of  severely stenotic Lt ICA bulb . received 3 mg of IA integrelin.  Post IR CT no ICH,mass effect or midline shift noted  24h CT head pending   MRI  pending   2D Echo  pending   LDL 102  HgbA1c 5.7  SCDs for  VTE prophylaxis  No antithrombotic prior to admission, now on aspirin 81 mg daily and Brilinta (ticagrelor) 90 mg bid following load. Continue at d/c.  Therapy recommendations:  pending   Disposition:  pending   Acute respiratory failure  Intubated for procedure  Currently weaning  CCM following  Dysphagia   Secondary to stroke and or intubation   Currently n.p.o.   Assess swallow post extubation  Hypertensive emergency  Treated with Cleviprex   Home medications resumed   Blood pressure goal per post TPA protocol   wean Cleviprex as able  . Long-term BP goal normotensive  Hyperlipidemia  Home meds: No statin  LDL 102, goal < 70  Add statin once able to swallow  Other Stroke Risk Factors  Obesity, There is no height or weight on file to calculate BMI., recommend weight loss, diet and exercise as appropriate   Family hx stroke (son  x 2)  Other Active Problems  Hypokalemia 3.3, repeat in am  Hospital day # 1  Burnetta Sabin, MSN, APRN, ANVP-BC, AGPCNP-BC Advanced Practice Stroke Nurse [place] for Schedule & Pager information 04/18/2018 1:40 PM  I have personally obtained history,examined this patient, reviewed notes, independently viewed imaging studies, participated in medical decision making and plan of care.ROS completed by me personally and pertinent positives fully documented  I have made any additions or clarifications directly to the above note. Agree with note above. She presented with aphasia and right hemiparesis due to left MCA infarct symptomatic from proximal left ICA stenosis and underwent acute revascularization requiring distal left carotid stent followed by successful mechanical thrombectomy and complete revascularization. Her neurological exam however still shows aphasia and right hemiplegia. She remains at risk for neurological worsening in hemorrhage and needs close neurological monitoring and strict blood pressure  control as per post intervention protocol. Continue aspirin treatment of her fresh carotid stent. Check MRI scan of the brain later today. Try weaning off ventilator if tolerated. Discussed with the patient's son at the bedside and with Dr. Elsworth Soho This patient is critically ill and at significant risk of neurological worsening, death and care requires constant monitoring of vital signs, hemodynamics,respiratory and cardiac monitoring, extensive review of multiple databases, frequent neurological assessment, discussion with family, other specialists and medical decision making of high complexity.I have made any additions or clarifications directly to the above note.This critical care time does not reflect procedure time, or teaching time or supervisory time of PA/NP/Med Resident etc but could involve care discussion time.  I spent 40 minutes of neurocritical care time  in the care of  this patient.     Antony Contras, MD Medical Director [facility] Pager: [phone] 04/18/2018 2:36 PM  To contact Stroke Continuity provider, please refer to [email or web address] After hours, contact General Neurology

## 2018-04-18 NOTE — Progress Notes (Signed)
Initial Nutrition Assessment  DOCUMENTATION CODES:   Obesity unspecified  INTERVENTION:   If unable to extubate in 24-48 hrs:   Vital High Protein @ 25 ml/hr via OGT (600 ml) 30 ml Prostat TID  Provides: 1000 kcals (1710 kcal with propofol), 113 grams protein, 502 ml free water. Meets 109% of calorie needs and 100% of protein needs.    NUTRITION DIAGNOSIS:   Inadequate oral intake related to inability to eat as evidenced by NPO status.  GOAL:   Patient will meet greater than or equal to 90% of their needs  MONITOR:   Diet advancement, Vent status, Weight trends, Labs, I & O's, Skin, TF tolerance  REASON FOR ASSESSMENT:   Ventilator    ASSESSMENT:   Patient with PMH significant for HTN and asthma. Presents this admission with dysphagia and right hemiparesis. Found to have acute large left MCA , s/p tPA.    3/2- revascularization of L MCA, stent L ICA  Spoke with CCM. Possibility of extubation this pm, wants to hold off on feeding. Pt may need Cortrak once extubated. RD to leave recommendations. Plan discussed with RN.   Spoke with husband at bedside. Denies pt has loss in appetite or unintentional wt loss PTA. She typically eats 2-3 meals daily and her UBW stays around 230 lb.   Weight shows to be stable around 230-235 lb over the last year. Will utilize EDW of 112 kg to estimate needs.   Patient is currently intubated on ventilator support MV: 5.9 L/min Temp (24hrs), Avg:97.7 F (36.5 C), Min:97.1 F (36.2 C), Max:98.7 F (37.1 C)  Propofol: 26.9 ml/hr- provides 710 kcal   Cleviprex discontinued.   I/O: +741 ml x 24 hrs  UOP: 200 ml x 24 hrs   Medications reviewed and include: NS @ 75 ml/hr, propofol Labs reviewed: K 3.3 (L)   NUTRITION - FOCUSED PHYSICAL EXAM:    Most Recent Value  Orbital Region  No depletion  Upper Arm Region  No depletion  Thoracic and Lumbar Region  Unable to assess  Buccal Region  Unable to assess  Temple Region  No depletion   Clavicle Bone Region  No depletion  Clavicle and Acromion Bone Region  No depletion  Scapular Bone Region  Unable to assess  Dorsal Hand  No depletion  Patellar Region  No depletion  Anterior Thigh Region  No depletion  Posterior Calf Region  No depletion  Edema (RD Assessment)  Mild  Hair  Reviewed  Eyes  Unable to assess  Mouth  Unable to assess  Skin  Reviewed  Nails  Reviewed     Diet Order:   Diet Order            Diet NPO time specified  Diet effective now              EDUCATION NEEDS:   Not appropriate for education at this time  Skin:  Skin Assessment: Skin Integrity Issues: Skin Integrity Issues:: Incisions Incisions: right groin   Last BM:  PTA  Height:   Ht Readings from Last 1 Encounters:  04/17/18 5\' 3"  (1.6 m)    Weight:   Wt Readings from Last 1 Encounters:  04/17/18 112 kg    Ideal Body Weight:  52.3 kg  BMI:  There is no height or weight on file to calculate BMI.  Estimated Nutritional Needs:   Kcal:  0165-5374 kcal  Protein:  105-131 grams  Fluid:  >/= 1.5 L/day    [facility]  RD, LDN Clinical Nutrition Pager # - [phone]

## 2018-04-18 NOTE — Progress Notes (Signed)
Notified Dr. Corliss Skains regarding pt pressures below goal range. Recommended to turn down sedation. No further orders at this time.

## 2018-04-18 NOTE — Progress Notes (Signed)
Kirkpatrick notified of pts pressure unimproved post bolus. BP parameters changed to SBP 100-140.  Will have this reflected in orders.

## 2018-04-18 NOTE — Progress Notes (Signed)
Pt currently on high doses of propofol  - NIH scored based on being intubated and heavily sedated.   Sherrie George, RN BSN

## 2018-04-18 NOTE — Consult Note (Signed)
..   NAME:  Kristin Solis, MRN:  [id number], DOB:  Nov 12, 1955, LOS: 1 ADMISSION DATE:  04/17/2018, CONSULTATION DATE:  04/18/2018 REFERRING MD:  Otelia Limes MD, CHIEF COMPLAINT:  acute onset of dysphasia and right hemiparesis  Brief History   63 yo female w/ PMHx asthma and HTN presented with dysphasia and right hemiparesis last seen normal at 7:30 PM/ Found to have an acute large ischemic left MCA stroke s/p TPA s/p revascularization s/p stent assisted angioplasty of severely stenotic left ICA. Remains intubated post procedure. PCCM consulted for vent mgmt  History of present illness   63 yr old female with PMHx significant for Asthma and HTN was at home when she developed acute dysphasia and right hemiparesis.  She was trying to relate a traumatic incident to her husband when she began having trouble with word finding and her husband noticed she was drooling. On presentation to EDP she was initially normal than had a recurrence of dysphasia with right hemiparesis. CTH showed acute ischemic CVA and pt received TPA at 20:32  Pt had imaging completed and was evaluated by IR for revascularization. Was intubated by Anesthesia at 22:38 on 04/17/2018 with a 7.5 ETT Pt received a lt common carotid arteriogram followed bycomplete  endovascular revascularization of occluded Lt MCA,Lt ACA aand Lt ICA terminus with x 1 pass with 5 mm x 33 mm embotrap retriver device achieving a TICI 3 revascularization. S/P stent assisted angioplasty of  severely stenotic Lt ICA at the bulb . Patient loaded with 180 m8 of brilinta and 81 mg of aspirin . Also received 3 mg of IA integrelin Transferred to Neuro ICU intubated and PCCM consulted for vent mgmt Past Medical History  .[name] Active Ambulatory Problems    Diagnosis Date Noted  . Essential hypertension, benign 01/15/2017  . Asthma 01/15/2017  . IFG (impaired fasting glucose) 03/28/2018   Resolved Ambulatory Problems    Diagnosis Date Noted  . Encounter to establish care  01/15/2017   Past Medical History:  Diagnosis Date  . Hypertension     Consults:  Interventional: 04/17/2018 Anesthesia: 04/17/2018 PCCM 04/18/2018  Procedures:  Endotracheally intubated on 04/17/2018 Cerebral Angiogram- 04/18/2018 Revascularization of occluded Left MCA, ACA and ICA and stenting of left ICA- 04/18/2018  Significant Diagnostic Tests:  CT brain perfusion: Left MCA distribution large ischemic penumbra, 77 cc. Left frontal white matter and insula core infarct of 11 cc. ASPECTS 8 infarct on noncontrast CT of head with possible pseudonormalization of perfusion.  CTA head: 1. Left M1 occlusion. Poor left MCA distribution collateralization. 2. No additional intracranial large vessel occlusion, high-grade stenosis, aneurysm, or vascular malformation.  CTA neck: 1. Left proximal ICA thromboembolism. The ICA downstream to the thrombosis is opacified with contrast suggesting that the thrombus is near occlusive, but not totally occlusive. 2. Patent right carotid system and bilateral vertebral arteries without hemodynamically significant stenosis by NASCET criteria, dissection, or aneurysm.  Micro Data:  none  Antimicrobials:  Ancef - 04/17/2018    Objective   Blood pressure (!) 107/93, pulse (!) 56, temperature (!) 97.5 F (36.4 C), temperature source Temporal, resp. rate 12, SpO2 100 %.        Intake/Output Summary (Last 24 hours) at 04/18/2018 0118 Last data filed at 04/18/2018 0018 Gross per 24 hour  Intake -  Output 100 ml  Net -100 ml   There were no vitals filed for this visit.  Examination: General: intubated and sedated HENT: normocephalic atraumatic & cervical collar Lungs: b/l breath sounds no  wheezing appreciated no rhonci Cardiovascular: S1 and S2 Abdomen: obese non tender non distended + BS. Right froin dressing in place Extremities: no edema,  Neuro: sedated unable to assess in full, wakes up follows commands. Not moving RUE  Assessment & Plan:    1. Acute Encephalopathy : dysphasia and right sided hemiparesis secondary to large left ischemic MCA stroke s/p revascularization Plan: Per Neurology Continues on sedation with Propofol No antiplatelet medications or anticoagulants for at least 24 hrs post tpa F/u TTE, Carotid US,  BP control w/ Cleviprex gtt  2. Acute respiratory failure secondary to neurologic injury Protection of airway Endotracheally intubated Plan: TV 8cc/kg FiO2 40% PEEP 5 RR 16 VAPrecautions Daily sedation vacation with weaning SBT per protocol Pending CXR- check ETT positioning  Best practice:  Diet: NPO Pain/Anxiety/Delirium protocol (if indicated): propofol and Fentanyl VAP protocol (if indicated): yes DVT prophylaxis: per primary  GI prophylaxis: PPI Glucose control: ISS if BG exceeds 180mg /dl Mobility: bedrest Code Status: Full Family Communication: husband initially at bedside  Disposition: ICU admitted under Neurology with PCCM consulted for vent mgmt  Labs   CBC: Recent Labs  Lab 04/17/18 1918  WBC 9.9  NEUTROABS 6.3  HGB 14.1  HCT 43.9  MCV 89.4  PLT 331    Basic Metabolic Panel: Recent Labs  Lab 04/17/18 1918  NA 139  K 3.5  CL 101  CO2 27  GLUCOSE 163*  BUN 22  CREATININE 1.04*  CALCIUM 8.9   GFR: Estimated Creatinine Clearance: 68.3 mL/min (A) (by C-G formula based on SCr of 1.04 mg/dL (H)). Recent Labs  Lab 04/17/18 1918  WBC 9.9    Liver Function Tests: Recent Labs  Lab 04/17/18 1918  AST 19  ALT 22  ALKPHOS 77  BILITOT 0.6  PROT 6.7  ALBUMIN 3.7   No results for input(s): LIPASE, AMYLASE in the last 168 hours. No results for input(s): AMMONIA in the last 168 hours.  ABG    Component Value Date/Time   O2SAT 41.8 04/17/2018 1944     Coagulation Profile: Recent Labs  Lab 04/17/18 1918  INR 1.0    Cardiac Enzymes: No results for input(s): CKTOTAL, CKMB, CKMBINDEX, TROPONINI in the last 168 hours.  HbA1C: Hgb A1c MFr Bld  Date/Time  Value Ref Range Status  03/30/2018 11:00 AM 5.7 (H) 4.8 - 5.6 % Final    Comment:             Prediabetes: 5.7 - 6.4          Diabetes: >6.4          Glycemic control for adults with diabetes: <7.0   09/22/2017 11:36 AM 5.6 4.8 - 5.6 % Final    Comment:             Prediabetes: 5.7 - 6.4          Diabetes: >6.4          Glycemic control for adults with diabetes: <7.0     CBG: Recent Labs  Lab 04/17/18 2009  GLUCAP 122*    Review of Systems:   Marland KitchenMarland KitchenReview of Systems  Unable to perform ROS: Critical illness  Neurological: Tremors:       Past Medical History  She,  has a past medical history of Asthma and Hypertension.   Surgical History    Past Surgical History:  Procedure Laterality Date  . ABDOMINAL HYSTERECTOMY  2005   total  . OOPHORECTOMY       Social History  reports that she has never smoked. She has never used smokeless tobacco. She reports that she does not drink alcohol or use drugs.   Family History   Her family history includes Alcohol abuse in her brother; CAD in her maternal grandmother; Cancer in her father and mother; Diabetes in her sister; Emphysema in her paternal grandfather; Heart disease in her sister; Stroke in her son.   Allergies No Known Allergies   Home Medications  Prior to Admission medications   Medication Sig Start Date End Date Taking? Authorizing Provider  albuterol (PROVENTIL HFA;VENTOLIN HFA) 108 (90 Base) MCG/ACT inhaler Inhale 2 puffs into the lungs every 6 (six) hours as needed for wheezing or shortness of breath. 03/30/18   Particia Nearing, PA-C  fluticasone [facility]) 50 MCG/ACT nasal spray Place 2 sprays into both nostrils daily. 03/30/18   Particia Nearing, PA-C  Fluticasone-Salmeterol (ADVAIR DISKUS) 250-50 MCG/DOSE AEPB Inhale 1 puff into the lungs 2 (two) times daily. 03/30/18   Particia Nearing, PA-C  ibuprofen (ADVIL,MOTRIN) 800 MG tablet Take 1 tablet (800 mg total) by mouth every 8 (eight) hours as  needed. 10/22/16   Little, Traci M, PA-C  lisinopril-hydrochlorothiazide (PRINZIDE,ZESTORETIC) 10-12.5 MG tablet Take 1 tablet by mouth daily. 03/30/18   Particia Nearing, PA-C  montelukast (SINGULAIR) 10 MG tablet Take 1 tablet (10 mg total) by mouth at bedtime. 03/30/18   Particia Nearing, PA-C  predniSONE (DELTASONE) 50 MG tablet Take 1 tablet (50 mg total) by mouth daily with breakfast. 04/11/18   Dorcas Carrow, DO     Critical care time: 55 mins     I, Dr Newell Coral have personally reviewed patient's available data, including medical history, events of note, physical examination and test results as part of my evaluation. I have discussed with NP and other care providers such as pharmacist, RN and Elink.  The patient is critically ill with multiple organ systems failure and requires high complexity decision making for assessment and support, frequent evaluation and titration of therapies, application of advanced monitoring technologies and extensive interpretation of multiple databases.   Critical Care Time devoted to patient care services described in this note is 55 Minutes. This time reflects time of care of this signee Dr Newell Coral. This critical care time does not reflect procedure time, or teaching time or supervisory time of NP but could involve care discussion time   Dr. Newell Coral Pulmonary Critical Care Medicine  04/18/2018 1:28 AM

## 2018-04-18 NOTE — Progress Notes (Signed)
Notified Kirkpatrick of soft BP despite decreasing sedation. Order for 1L bolus at this time.   WCTM

## 2018-04-18 NOTE — Procedures (Signed)
S/P lt common carotid arteriogram foll;owed by endovascular complete revascularization of occluded lt mac,Lt ACA aand Lt ICA terminus with x 1 pass with 5 mm x 33 mm embotrap retriver device achieving a TICI 3 revascularization. S/P stent assisted angioplasty of  severely stenotic Lt ICA at the bulb . Patient loaded with 180 m8 of brilinta and 81 mg of aspirin . Also received 3 mg of IA integrelin.

## 2018-04-18 NOTE — Progress Notes (Signed)
63 year old woman admitted 3/1 with acute onset right hemiparesis and dysphagia, received TPA and underwent emergent revascularization of occluded left ICA/MCA and ACA.  She was hypertensive and required Cleviprex drip and intubated for the procedure. This morning sheath was removed.  On exam-when propofol lowered, follows one-step commands, dense right hemi-plegia, grips and on left and moves toes on left, mildly agitated with coughing, decreased breath sounds bilateral, S1-S2 regular, soft nontender abdomen.  Labs reviewed show mild hypokalemia.  Chest x-ray personally reviewed which shows ET tube at carina.  Impression/plan  Acute respiratory failure-was intubated full procedure, concerned about ability to protect her airway, she does breathe spontaneously on pressure support 5/5. Plan is to attempt extubation based on MRI results Would be high risk for extubation, explained concept of son.  Hypertensive emergency-lisinopril/HCTZ has been restarted by primary team. We will use hydralazine and labetalol as needed and attempt to come off Cleviprex drip.  Stroke management per neurology  Updated son at bedside. Additional critical care time x 55m  Jaydan Chretien V. Vassie Loll MD  [facility] Pager [phone] If no response call 319 [phone]   04/18/2018

## 2018-04-18 NOTE — Progress Notes (Signed)
MD notified of SBP <100, another 1L saline bolus ordered.

## 2018-04-18 NOTE — Progress Notes (Signed)
  Echocardiogram 2D Echocardiogram has been performed.  Kristin Solis 04/18/2018, 2:59 PM

## 2018-04-18 NOTE — Progress Notes (Signed)
Referring Physician(s): CODE STROKE- Caryl Pina  Supervising Physician: Julieanne Cotton  Patient Status:  [facility] - In-pt  Chief Complaint: None  Subjective:  Left ICA, left MCA, and left ACA occlusions s/p emergent mechanical thrombectomy achieving a TICI 3 revascularization 04/17/2018 by Dr. Corliss Skains. Left ICA stenosis s/p revascularization using stent assisted angioplasty 04/17/2018 by Dr. Corliss Skains. Patient laying in bed intubated with sedation. Accompanied by son at bedside. Opens eyes to voice and follows some simple commands (wiggles left toes on command). Can spontaneously move left side with minimal movements of right side (RUE>RLE). Right groin incision c/d/i.   Allergies: Patient has no known allergies.  Medications: Prior to Admission medications   Medication Sig Start Date End Date Taking? Authorizing Provider  albuterol (PROVENTIL HFA;VENTOLIN HFA) 108 (90 Base) MCG/ACT inhaler Inhale 2 puffs into the lungs every 6 (six) hours as needed for wheezing or shortness of breath. 03/30/18   Particia Nearing, PA-C  fluticasone [facility]) 50 MCG/ACT nasal spray Place 2 sprays into both nostrils daily. 03/30/18   Particia Nearing, PA-C  Fluticasone-Salmeterol (ADVAIR DISKUS) 250-50 MCG/DOSE AEPB Inhale 1 puff into the lungs 2 (two) times daily. 03/30/18   Particia Nearing, PA-C  ibuprofen (ADVIL,MOTRIN) 800 MG tablet Take 1 tablet (800 mg total) by mouth every 8 (eight) hours as needed. 10/22/16   Little, Traci M, PA-C  lisinopril-hydrochlorothiazide (PRINZIDE,ZESTORETIC) 10-12.5 MG tablet Take 1 tablet by mouth daily. 03/30/18   Particia Nearing, PA-C  montelukast (SINGULAIR) 10 MG tablet Take 1 tablet (10 mg total) by mouth at bedtime. 03/30/18   Particia Nearing, PA-C  predniSONE (DELTASONE) 50 MG tablet Take 1 tablet (50 mg total) by mouth daily with breakfast. 04/11/18   Olevia Perches P, DO     Vital Signs: BP (!) 110/55   Pulse 73   Temp 97.6  F (36.4 C) (Axillary)   Resp 19   SpO2 98%   Physical Exam Vitals signs and nursing note reviewed.  Constitutional:      General: She is not in acute distress.    Appearance: She is normal weight.     Comments: Intubated with sedation.  Pulmonary:     Effort: Pulmonary effort is normal. No respiratory distress.     Comments: Intubated with sedation. Skin:    General: Skin is warm and dry.     Comments: Right groin incision soft without active bleeding or hematoma.  Neurological:     Comments: Intubated with sedation. She opens eyes to voice and follows some simple commands (wiggles left toes on command). PERRL bilaterally. Unable to assess facial asymmetry due to ET tube. Unable to assess tongue protrusion due to ET tube. Can spontaneously move left side with minimal movements of right side (RUE>RLE). EOMs, visual fields, pronator drift, fine motor and coordination, gait, romberg, and heel to toe not assessed. Distal pulses 1+ bilaterally.  Psychiatric:     Comments: Intubated with sedation.     Imaging: Ct Angio Head W Or Wo Contrast  Result Date: 04/17/2018 EXAM: CT ANGIOGRAPHY HEAD AND NECK CT PERFUSION BRAIN TECHNIQUE: Multidetector CT imaging of the head and neck was performed using the standard protocol during bolus administration of intravenous contrast. Multiplanar CT image reconstructions and MIPs were obtained to evaluate the vascular anatomy. Carotid stenosis measurements (when applicable) are obtained utilizing NASCET criteria, using the distal internal carotid diameter as the denominator. Multiphase CT imaging of the brain was performed following IV bolus contrast injection. Subsequent parametric perfusion maps  were calculated using RAPID software. CONTRAST:  ISOVUE-370 IOPAMIDOL (ISOVUE-370) INJECTION 76% COMPARISON:  None. FINDINGS: CTA NECK FINDINGS Aortic arch: Standard branching. Imaged portion shows no evidence of aneurysm or dissection. No significant  stenosis of the major arch vessel origins. Right carotid system: No evidence of dissection, stenosis (50% or greater) or occlusion. Non stenotic calcific atherosclerosis of the carotid bifurcation. Left carotid system: Left proximal ICA thromboembolism. The ICA downstream to the thrombosis is opacified with contrast suggesting that the thrombus is near occlusive but not totally occlusive. Vertebral arteries: Left dominant. No evidence of dissection, stenosis (50% or greater) or occlusion. Skeleton: No acute finding. Other neck: 7 mm nodule within the left lobe of the thyroid gland. Upper chest: Negative. Review of the MIP images confirms the above findings CTA HEAD FINDINGS Anterior circulation: Non stenotic calcific atherosclerosis of the carotid siphons. Large right A1, large anterior communicating artery, diminutive left A1, normal variant. Patent bilateral ACA and right MCA distributions. Left M1 occlusion with poor left MCA distribution collateralization. Posterior circulation: No significant stenosis, proximal occlusion, aneurysm, or vascular malformation. Venous sinuses: As permitted by contrast timing, patent. Anatomic variants: None significant. Review of the MIP images confirms the above findings CT Brain Perfusion Findings: CBF (<30%) Volume: 11mL Perfusion (Tmax>6.0s) volume: 77mL Mismatch Volume: 66mL Infarction Location:Left MCA distribution, ASPECTS 8 stroke on noncontrast CT. IMPRESSION: CT brain perfusion: Left MCA distribution large ischemic penumbra, 77 cc. Left frontal white matter and insula core infarct of 11 cc. ASPECTS 8 infarct on noncontrast CT of head with possible pseudonormalization of perfusion. CTA head: 1. Left M1 occlusion.  Poor left MCA distribution collateralization. 2. No additional intracranial large vessel occlusion, high-grade stenosis, aneurysm, or vascular malformation. CTA neck: 1. Left proximal ICA thromboembolism. The ICA downstream to the thrombosis is opacified with  contrast suggesting that the thrombus is near occlusive, but not totally occlusive. 2. Patent right carotid system and bilateral vertebral arteries without hemodynamically significant stenosis by NASCET criteria, dissection, or aneurysm. These results were called by telephone at the time of interpretation on 04/17/2018 at 9:24 pm to Dr. Daryel November , who verbally acknowledged these results. Electronically Signed   By: Mitzi Hansen M.D.   On: 04/17/2018 21:35   Dg Chest 1 View  Result Date: 04/18/2018 CLINICAL DATA:  Post intubation EXAM: CHEST  1 VIEW COMPARISON:  04/17/2018 FINDINGS: Interval intubation, tip of the endotracheal tube is just above the carina. Esophageal tube tip overlies the gastric fundus and projects near GE junction. Streaky atelectasis at the left base. Borderline cardiomegaly. No pneumothorax. Contrast material within the renal collecting systems. IMPRESSION: 1. Endotracheal tube tip just above the carina. Esophageal tube tip projects over the gastric fundal region and is directed cephalad near the GE junction 2. Streaky left perihilar and basilar atelectasis. Borderline cardiomegaly Electronically Signed   By: Jasmine Pang M.D.   On: 04/18/2018 03:56   Dg Chest 1 View  Result Date: 04/17/2018 CLINICAL DATA:  Right-sided weakness.  Asthma and hypertension EXAM: CHEST  1 VIEW COMPARISON:  None. FINDINGS: The heart size and mediastinal contours are within normal limits. Both lungs are clear. The visualized skeletal structures are unremarkable. IMPRESSION: No active disease. Electronically Signed   By: Signa Kell M.D.   On: 04/17/2018 21:58   Ct Angio Neck W Or Wo Contrast  Result Date: 04/17/2018 EXAM: CT ANGIOGRAPHY HEAD AND NECK CT PERFUSION BRAIN TECHNIQUE: Multidetector CT imaging of the head and neck was performed using the standard protocol during  bolus administration of intravenous contrast. Multiplanar CT image reconstructions and MIPs were obtained to  evaluate the vascular anatomy. Carotid stenosis measurements (when applicable) are obtained utilizing NASCET criteria, using the distal internal carotid diameter as the denominator. Multiphase CT imaging of the brain was performed following IV bolus contrast injection. Subsequent parametric perfusion maps were calculated using RAPID software. CONTRAST:  150mL ISOVUE-370 IOPAMIDOL (ISOVUE-370) INJECTION 76% COMPARISON:  None. FINDINGS: CTA NECK FINDINGS Aortic arch: Standard branching. Imaged portion shows no evidence of aneurysm or dissection. No significant stenosis of the major arch vessel origins. Right carotid system: No evidence of dissection, stenosis (50% or greater) or occlusion. Non stenotic calcific atherosclerosis of the carotid bifurcation. Left carotid system: Left proximal ICA thromboembolism. The ICA downstream to the thrombosis is opacified with contrast suggesting that the thrombus is near occlusive but not totally occlusive. Vertebral arteries: Left dominant. No evidence of dissection, stenosis (50% or greater) or occlusion. Skeleton: No acute finding. Other neck: 7 mm nodule within the left lobe of the thyroid gland. Upper chest: Negative. Review of the MIP images confirms the above findings CTA HEAD FINDINGS Anterior circulation: Non stenotic calcific atherosclerosis of the carotid siphons. Large right A1, large anterior communicating artery, diminutive left A1, normal variant. Patent bilateral ACA and right MCA distributions. Left M1 occlusion with poor left MCA distribution collateralization. Posterior circulation: No significant stenosis, proximal occlusion, aneurysm, or vascular malformation. Venous sinuses: As permitted by contrast timing, patent. Anatomic variants: None significant. Review of the MIP images confirms the above findings CT Brain Perfusion Findings: CBF (<30%) Volume: 11mL Perfusion (Tmax>6.0s) volume: 77mL Mismatch Volume: 66mL Infarction Location:Left MCA distribution,  ASPECTS 8 stroke on noncontrast CT. IMPRESSION: CT brain perfusion: Left MCA distribution large ischemic penumbra, 77 cc. Left frontal white matter and insula core infarct of 11 cc. ASPECTS 8 infarct on noncontrast CT of head with possible pseudonormalization of perfusion. CTA head: 1. Left M1 occlusion.  Poor left MCA distribution collateralization. 2. No additional intracranial large vessel occlusion, high-grade stenosis, aneurysm, or vascular malformation. CTA neck: 1. Left proximal ICA thromboembolism. The ICA downstream to the thrombosis is opacified with contrast suggesting that the thrombus is near occlusive, but not totally occlusive. 2. Patent right carotid system and bilateral vertebral arteries without hemodynamically significant stenosis by NASCET criteria, dissection, or aneurysm. These results were called by telephone at the time of interpretation on 04/17/2018 at 9:24 pm to Dr. Daryel NovemberJonathan Williams , who verbally acknowledged these results. Electronically Signed   By: Mitzi HansenLance  Furusawa-Stratton M.D.   On: 04/17/2018 21:35   Ct Cerebral Perfusion W Contrast  Result Date: 04/17/2018 EXAM: CT ANGIOGRAPHY HEAD AND NECK CT PERFUSION BRAIN TECHNIQUE: Multidetector CT imaging of the head and neck was performed using the standard protocol during bolus administration of intravenous contrast. Multiplanar CT image reconstructions and MIPs were obtained to evaluate the vascular anatomy. Carotid stenosis measurements (when applicable) are obtained utilizing NASCET criteria, using the distal internal carotid diameter as the denominator. Multiphase CT imaging of the brain was performed following IV bolus contrast injection. Subsequent parametric perfusion maps were calculated using RAPID software. CONTRAST:  150mL ISOVUE-370 IOPAMIDOL (ISOVUE-370) INJECTION 76% COMPARISON:  None. FINDINGS: CTA NECK FINDINGS Aortic arch: Standard branching. Imaged portion shows no evidence of aneurysm or dissection. No significant  stenosis of the major arch vessel origins. Right carotid system: No evidence of dissection, stenosis (50% or greater) or occlusion. Non stenotic calcific atherosclerosis of the carotid bifurcation. Left carotid system: Left proximal ICA thromboembolism. The ICA downstream  to the thrombosis is opacified with contrast suggesting that the thrombus is near occlusive but not totally occlusive. Vertebral arteries: Left dominant. No evidence of dissection, stenosis (50% or greater) or occlusion. Skeleton: No acute finding. Other neck: 7 mm nodule within the left lobe of the thyroid gland. Upper chest: Negative. Review of the MIP images confirms the above findings CTA HEAD FINDINGS Anterior circulation: Non stenotic calcific atherosclerosis of the carotid siphons. Large right A1, large anterior communicating artery, diminutive left A1, normal variant. Patent bilateral ACA and right MCA distributions. Left M1 occlusion with poor left MCA distribution collateralization. Posterior circulation: No significant stenosis, proximal occlusion, aneurysm, or vascular malformation. Venous sinuses: As permitted by contrast timing, patent. Anatomic variants: None significant. Review of the MIP images confirms the above findings CT Brain Perfusion Findings: CBF (<30%) Volume: 40mL Perfusion (Tmax>6.0s) volume: 45mL Mismatch Volume: 19mL Infarction Location:Left MCA distribution, ASPECTS 8 stroke on noncontrast CT. IMPRESSION: CT brain perfusion: Left MCA distribution large ischemic penumbra, 77 cc. Left frontal white matter and insula core infarct of 11 cc. ASPECTS 8 infarct on noncontrast CT of head with possible pseudonormalization of perfusion. CTA head: 1. Left M1 occlusion.  Poor left MCA distribution collateralization. 2. No additional intracranial large vessel occlusion, high-grade stenosis, aneurysm, or vascular malformation. CTA neck: 1. Left proximal ICA thromboembolism. The ICA downstream to the thrombosis is opacified with  contrast suggesting that the thrombus is near occlusive, but not totally occlusive. 2. Patent right carotid system and bilateral vertebral arteries without hemodynamically significant stenosis by NASCET criteria, dissection, or aneurysm. These results were called by telephone at the time of interpretation on 04/17/2018 at 9:24 pm to Dr. Daryel November , who verbally acknowledged these results. Electronically Signed   By: Mitzi Hansen M.D.   On: 04/17/2018 21:35   Ct Head Code Stroke Wo Contrast`  Result Date: 04/17/2018 CLINICAL DATA:  Code stroke. 63 y/o F; transient episode of word-finding difficulty with recurrent symptoms including weakness of right arm and leg. EXAM: CT HEAD WITHOUT CONTRAST TECHNIQUE: Contiguous axial images were obtained from the base of the skull through the vertex without intravenous contrast. COMPARISON:  None. FINDINGS: Brain: Mild asymmetric hypoattenuation within the left lateral frontal lobe and anterior insula in comparison with the right which may represent acute stroke. ASPECTS is 8. No hemorrhage or mass effect. Several small chronic infarctions are present within the left parietal cortex. No extra-axial collection, hydrocephalus, or herniation. Vascular: Small density within left sylvian fissure may represent a MCA branch thrombus (series 6, image 11). Skull: Normal. Negative for fracture or focal lesion. Sinuses/Orbits: No acute finding. Other: None. ASPECTS [facility] Stroke Program Early CT Score) - Ganglionic level infarction (caudate, lentiform nuclei, internal capsule, insula, M1-M3 cortex): 5 - Supraganglionic infarction (M4-M6 cortex): 3 Total score (0-10 with 10 being normal): 8 IMPRESSION: 1. Mild asymmetric hypoattenuation within the left lateral frontal lobe and anterior insula in comparison with the right which may represent acute stroke. No hemorrhage or mass effect. 2. Small density within left Sylvian fissure, possible MCA branch thrombus. 3. ASPECTS  is 8. 4. Several small chronic infarctions in left parietal cortex. These results were called by telephone at the time of interpretation on 04/17/2018 at 8:10 pm to Dr. Daryel November , who verbally acknowledged these results. Electronically Signed   By: Mitzi Hansen M.D.   On: 04/17/2018 20:12    Labs:  CBC: Recent Labs    04/17/18 1918 04/18/18 0454  WBC 9.9 12.9*  HGB 14.1 13.1  HCT 43.9 40.6  PLT 331 327    COAGS: Recent Labs    04/17/18 1918  INR 1.0  APTT 27    BMP: Recent Labs    09/22/17 1136 03/30/18 1100 04/17/18 1918 04/18/18 0454  NA 142 142 139 136  K 3.7 3.6 3.5 3.3*  CL 103 102 101 106  CO2 25 25 27 23   GLUCOSE 115* 74 163* 162*  BUN 13 15 22 16   CALCIUM 9.6 9.6 8.9 8.3*  CREATININE 0.97 1.00 1.04* 0.89  GFRNONAA 63 61 58* >60  GFRAA 73 70 >60 >60    LIVER FUNCTION TESTS: Recent Labs    09/22/17 1136 04/17/18 1918  BILITOT 0.3 0.6  AST 14 19  ALT 20 22  ALKPHOS 98 77  PROT 6.2 6.7  ALBUMIN 4.0 3.7    Assessment and Plan:  Left ICA, left MCA, and left ACA occlusions s/p emergent mechanical thrombectomy achieving a TICI 3 revascularization 04/17/2018 by Dr. Corliss Skains. Left ICA stenosis s/p revascularization using stent assisted angioplasty 04/17/2018 by Dr. Corliss Skains. Patient's condition stable- remains intubated/sedated, opens eyes to voice and follows some simple commands (wiggles left toes on command), can spontaneously move left side with minimal movements of right side (RUE>RLE). Right groin incision stable. Continue taking Brilinta 90 mg twice daily and Aspirin 81 mg once daily. Plan to follow-up with Dr. Corliss Skains in clinic 4 weeks after discharge. Appreciate and agree with neurology and CCM management. IR to follow.   Electronically Signed: Elwin Mocha, PA-C 04/18/2018, 8:53 AM   I spent a total of 25 Minutes at the the patient's bedside AND on the patient's hospital floor or unit, greater than 50% of which was  counseling/coordinating care for left ICA, left MCA and left ACA occlusions s/p revascularization.

## 2018-04-18 NOTE — Progress Notes (Signed)
Patient transported on vent from MRI to 4N-[id number] without complication.

## 2018-04-18 NOTE — Progress Notes (Signed)
Patient ID: Kristin Solis, female   DOB: March 27, 1955, 63 y.o.   MRN: [id number] INR. 62 yr RH F mRSS 0  LSW 530pm Acute onset ofaphasia and RT sided hemiparesis.Apparently recovered with recurrence at 742pm. CT Brain NO ICH ASPECTS 8 CTA occluded LT MCa and LT ICA  nearly completely at the bulb. CTP maps core of 11 cc versus vol at risk of 77 ml with  Mismatch of 61ml and a ratio of 7.0. Option of endovascular revascularization D/W spouse. Reasons,risks,alternatives were reviewed. Risks of ICH of 10 %,worsening neuro condition,death ,inability to revascularize and vascular injury were reviewed. Spouse expressed understanding and provided  Informed witnessed consent to proceed with endovascular treatment. S.Kerensa Nicklas MD

## 2018-04-18 NOTE — Progress Notes (Signed)
From 0115, follow a-line pressures. BP cuff not crossing over.

## 2018-04-18 NOTE — Progress Notes (Signed)
eLink Physician-Brief Progress Note Patient Name: DELZORA ADOLF DOB: [birth year] MRN: [id number]   Date of Service  04/18/2018  HPI/Events of Note  63 yr old female received tPA for code stroke. Underwent IR intervention for occluded Left MCA/ICA with ASPECT score 8. Post procedure- in ICU for vent management.   Camera: Discussed with bed side RN. SBP on art lie at goal 134. HR 73. Getting agitated, pulling /going for lines. Asking for restraints. Need re propofol order, fell off.  In synchrony with vent. 440/07/02/38%. PIP 22.   Data: Reviewed. CxR pre intubation: normal.  Post IR Radiology. As per MD no ICH.     eICU Interventions  - continue care - on SUP and SCD - follow CxR while on Vent. - Vt goal Under < 8 - daily weaning trials as tolerated for neuro checks and weaning.  - goal SBP as per IR 120 to 140. - BG goal < 180.  - soft bilateral wrist restraints ordered to prevent self injury and harm - Propofol ordered for RASS 0 to -1.  - bed side MD was notified of admission/vent management earlier.      Intervention Category Major Interventions: Other: Intermediate Interventions: Best-practice therapies (e.g. DVT, beta blocker, etc.) Minor Interventions: Agitation / anxiety - evaluation and management Evaluation Type: New Patient Evaluation  Ranee Gosselin 04/18/2018, 2:07 AM

## 2018-04-18 NOTE — Progress Notes (Signed)
SLP Cancellation Note  Patient Details Name: Kristin Solis MRN: [id number] DOB: February 21, 1955   Cancelled treatment:       Reason Eval/Treat Not Completed: Pt not medically ready, still intubated. SLP to f/u when ready.   Gardiner Ramus, Student SLP 04/18/2018, 8:33 AM

## 2018-04-18 NOTE — Progress Notes (Signed)
OT Cancellation Note  Patient Details Name: Kristin Solis MRN: [id number] DOB: 1955-11-10   Cancelled Treatment:    Reason Eval/Treat Not Completed: Patient not medically ready. Pt remains intubated, sedated, and on propofol. RN reports she is weaning off sedation and she is starting to use R UE. Acute OT to return as appropriate to complete OT eval.  Kristin Solis, Kristin Solis [facility] Pager [phone] Office [phone]     Evette Georges 04/18/2018, 11:46 AM

## 2018-04-18 NOTE — Progress Notes (Signed)
Patient ID: Kristin Solis, female   DOB: [birth year], 63 y.o.   MRN: [id number] INR. Pre procedural CT no evidence of ICH. Post procedure CT brain no ICH,mass effect or midline shift noted. Puplis 90mm RT = LT. RT groin 53F sheath replaced with an 53F angioseal closure device. Distal pulses palpable DPs and PT s bilaterally.Left intubated for airway protection. S.Chava Dulac MD

## 2018-04-19 ENCOUNTER — Inpatient Hospital Stay (HOSPITAL_COMMUNITY): Payer: BLUE CROSS/BLUE SHIELD

## 2018-04-19 ENCOUNTER — Encounter (HOSPITAL_COMMUNITY): Payer: Self-pay | Admitting: Interventional Radiology

## 2018-04-19 DIAGNOSIS — I6602 Occlusion and stenosis of left middle cerebral artery: Secondary | ICD-10-CM

## 2018-04-19 DIAGNOSIS — J9600 Acute respiratory failure, unspecified whether with hypoxia or hypercapnia: Secondary | ICD-10-CM

## 2018-04-19 LAB — CBC
HCT: 33.4 % — ABNORMAL LOW (ref 36.0–46.0)
Hemoglobin: 10.6 g/dL — ABNORMAL LOW (ref 12.0–15.0)
MCH: 28.8 pg (ref 26.0–34.0)
MCHC: 31.7 g/dL (ref 30.0–36.0)
MCV: 90.8 fL (ref 80.0–100.0)
Platelets: 252 10*3/uL (ref 150–400)
RBC: 3.68 MIL/uL — ABNORMAL LOW (ref 3.87–5.11)
RDW: 13.4 % (ref 11.5–15.5)
WBC: 9.4 10*3/uL (ref 4.0–10.5)
nRBC: 0 % (ref 0.0–0.2)

## 2018-04-19 LAB — BASIC METABOLIC PANEL
ANION GAP: 6 (ref 5–15)
BUN: 11 mg/dL (ref 8–23)
CO2: 22 mmol/L (ref 22–32)
Calcium: 8.1 mg/dL — ABNORMAL LOW (ref 8.9–10.3)
Chloride: 112 mmol/L — ABNORMAL HIGH (ref 98–111)
Creatinine, Ser: 0.86 mg/dL (ref 0.44–1.00)
GFR calc Af Amer: >60 mL/min (ref >60)
GFR calc non Af Amer: >60 mL/min (ref >60)
Glucose, Bld: 115 mg/dL — ABNORMAL HIGH (ref 70–99)
Potassium: 3.1 mmol/L — ABNORMAL LOW (ref 3.5–5.1)
Sodium: 140 mmol/L (ref 135–145)

## 2018-04-19 MED ORDER — IPRATROPIUM-ALBUTEROL 0.5-2.5 (3) MG/3ML IN SOLN
RESPIRATORY_TRACT | Status: AC
  Filled 2018-04-19: qty 3

## 2018-04-19 MED ORDER — POTASSIUM CHLORIDE 20 MEQ/15ML (10%) PO SOLN
40.0000 meq | Freq: Once | ORAL | Status: AC
  Administered 2018-04-19: 40 meq
  Filled 2018-04-19: qty 30

## 2018-04-19 MED ORDER — CHLORHEXIDINE GLUCONATE 0.12 % MT SOLN
15.0000 mL | Freq: Two times a day (BID) | OROMUCOSAL | Status: DC
  Administered 2018-04-19 – 2018-04-21 (×5): 15 mL via OROMUCOSAL
  Filled 2018-04-19 (×4): qty 15

## 2018-04-19 MED ORDER — ORAL CARE MOUTH RINSE
15.0000 mL | Freq: Two times a day (BID) | OROMUCOSAL | Status: DC
  Administered 2018-04-19 – 2018-04-21 (×5): 15 mL via OROMUCOSAL

## 2018-04-19 MED ORDER — SENNOSIDES-DOCUSATE SODIUM 8.6-50 MG PO TABS: 1.0000 | ORAL_TABLET | Freq: Every evening | ORAL | Status: DC | PRN

## 2018-04-19 MED ORDER — MONTELUKAST SODIUM 10 MG PO TABS
10.0000 mg | ORAL_TABLET | Freq: Every day | ORAL | Status: DC
  Administered 2018-04-19 – 2018-04-21 (×3): 10 mg
  Filled 2018-04-19 (×3): qty 1

## 2018-04-19 MED ORDER — IPRATROPIUM-ALBUTEROL 0.5-2.5 (3) MG/3ML IN SOLN
3.0000 mL | Freq: Three times a day (TID) | RESPIRATORY_TRACT | Status: DC
  Administered 2018-04-19 – 2018-04-21 (×6): 3 mL via RESPIRATORY_TRACT
  Filled 2018-04-19 (×5): qty 3

## 2018-04-19 NOTE — Progress Notes (Signed)
Referring Physician(s): CODE STROKE- Caryl Pina  Supervising Physician: Julieanne Cotton  Patient Status:  [facility] - In-pt  Chief Complaint: None  Subjective:  Left ICA, left MCA, and left ACA occlusions s/p emergent mechanical thrombectomy achieving a TICI 3 revascularization 04/17/2018 by Dr. Corliss Skains. Left ICA stenosis s/p revascularization using stent assisted angioplasty 04/17/2018 by Dr. Corliss Skains. Patient awake and alert laying in bed with no complaints at this time. Accompanied by husband at bedside. Can spontaneously move all extremities with some weakness of RUE (can lift off bed against gravity but not fully extend). Right groin incision c/d/i.   Allergies: Patient has no known allergies.  Medications: Prior to Admission medications   Medication Sig Start Date End Date Taking? Authorizing Provider  albuterol (PROVENTIL HFA;VENTOLIN HFA) 108 (90 Base) MCG/ACT inhaler Inhale 2 puffs into the lungs every 6 (six) hours as needed for wheezing or shortness of breath. 03/30/18  Yes Particia Nearing, PA-C  fluticasone [facility]) 50 MCG/ACT nasal spray Place 2 sprays into both nostrils daily. 03/30/18  Yes Particia Nearing, PA-C  Fluticasone-Salmeterol (ADVAIR DISKUS) 250-50 MCG/DOSE AEPB Inhale 1 puff into the lungs 2 (two) times daily. 03/30/18  Yes Particia Nearing, PA-C  lisinopril-hydrochlorothiazide (PRINZIDE,ZESTORETIC) 10-12.5 MG tablet Take 1 tablet by mouth daily. 03/30/18  Yes Particia Nearing, PA-C  predniSONE (DELTASONE) 50 MG tablet Take 1 tablet (50 mg total) by mouth daily with breakfast. 04/11/18  Yes Johnson, Megan P, DO  ibuprofen (ADVIL,MOTRIN) 800 MG tablet Take 1 tablet (800 mg total) by mouth every 8 (eight) hours as needed. Patient not taking: Reported on 04/18/2018 10/22/16   Little, Traci M, PA-C  montelukast (SINGULAIR) 10 MG tablet Take 1 tablet (10 mg total) by mouth at bedtime. Patient not taking: Reported on 04/18/2018 03/30/18   Particia Nearing, PA-C     Vital Signs: BP (!) 103/54   Pulse 73   Temp 99.3 F (37.4 C) (Axillary)   Resp 18   SpO2 98%   Physical Exam Vitals signs and nursing note reviewed.  Constitutional:      General: She is not in acute distress.    Appearance: Normal appearance.  Pulmonary:     Effort: Pulmonary effort is normal. No respiratory distress.  Skin:    General: Skin is warm and dry.     Comments: Right groin incision soft without active bleeding or hematoma.  Neurological:     Mental Status: She is alert.     Comments: Alert, awake, and oriented x3. Speech and comprehension intact. PERRL bilaterally. EOMs intact bilaterally without nystagmus or subjective diplopia. No facial asymmetry. Tongue midline. Can spontaneously move all extremities with some weakness of RUE (can lift off bed against gravity but not fully extend). EOMs, visual fields, pronator drift, fine motor and coordination, gait, romberg, and heel to toe not assessed. Distal pulses 1+ bilaterally.  Psychiatric:        Mood and Affect: Mood normal.        Behavior: Behavior normal.        Thought Content: Thought content normal.        Judgment: Judgment normal.     Imaging: Ct Angio Head W Or Wo Contrast  Result Date: 04/17/2018 EXAM: CT ANGIOGRAPHY HEAD AND NECK CT PERFUSION BRAIN TECHNIQUE: Multidetector CT imaging of the head and neck was performed using the standard protocol during bolus administration of intravenous contrast. Multiplanar CT image reconstructions and MIPs were obtained to evaluate the vascular anatomy. Carotid stenosis  measurements (when applicable) are obtained utilizing NASCET criteria, using the distal internal carotid diameter as the denominator. Multiphase CT imaging of the brain was performed following IV bolus contrast injection. Subsequent parametric perfusion maps were calculated using RAPID software. CONTRAST:  ISOVUE-370 IOPAMIDOL (ISOVUE-370) INJECTION 76% COMPARISON:   None. FINDINGS: CTA NECK FINDINGS Aortic arch: Standard branching. Imaged portion shows no evidence of aneurysm or dissection. No significant stenosis of the major arch vessel origins. Right carotid system: No evidence of dissection, stenosis (50% or greater) or occlusion. Non stenotic calcific atherosclerosis of the carotid bifurcation. Left carotid system: Left proximal ICA thromboembolism. The ICA downstream to the thrombosis is opacified with contrast suggesting that the thrombus is near occlusive but not totally occlusive. Vertebral arteries: Left dominant. No evidence of dissection, stenosis (50% or greater) or occlusion. Skeleton: No acute finding. Other neck: 7 mm nodule within the left lobe of the thyroid gland. Upper chest: Negative. Review of the MIP images confirms the above findings CTA HEAD FINDINGS Anterior circulation: Non stenotic calcific atherosclerosis of the carotid siphons. Large right A1, large anterior communicating artery, diminutive left A1, normal variant. Patent bilateral ACA and right MCA distributions. Left M1 occlusion with poor left MCA distribution collateralization. Posterior circulation: No significant stenosis, proximal occlusion, aneurysm, or vascular malformation. Venous sinuses: As permitted by contrast timing, patent. Anatomic variants: None significant. Review of the MIP images confirms the above findings CT Brain Perfusion Findings: CBF (<30%) Volume: 11mL Perfusion (Tmax>6.0s) volume: 77mL Mismatch Volume: 66mL Infarction Location:Left MCA distribution, ASPECTS 8 stroke on noncontrast CT. IMPRESSION: CT brain perfusion: Left MCA distribution large ischemic penumbra, 77 cc. Left frontal white matter and insula core infarct of 11 cc. ASPECTS 8 infarct on noncontrast CT of head with possible pseudonormalization of perfusion. CTA head: 1. Left M1 occlusion.  Poor left MCA distribution collateralization. 2. No additional intracranial large vessel occlusion, high-grade stenosis,  aneurysm, or vascular malformation. CTA neck: 1. Left proximal ICA thromboembolism. The ICA downstream to the thrombosis is opacified with contrast suggesting that the thrombus is near occlusive, but not totally occlusive. 2. Patent right carotid system and bilateral vertebral arteries without hemodynamically significant stenosis by NASCET criteria, dissection, or aneurysm. These results were called by telephone at the time of interpretation on 04/17/2018 at 9:24 pm to Dr. Daryel November , who verbally acknowledged these results. Electronically Signed   By: Mitzi Hansen M.D.   On: 04/17/2018 21:35   Dg Chest 1 View  Result Date: 04/18/2018 CLINICAL DATA:  Post intubation EXAM: CHEST  1 VIEW COMPARISON:  04/17/2018 FINDINGS: Interval intubation, tip of the endotracheal tube is just above the carina. Esophageal tube tip overlies the gastric fundus and projects near GE junction. Streaky atelectasis at the left base. Borderline cardiomegaly. No pneumothorax. Contrast material within the renal collecting systems. IMPRESSION: 1. Endotracheal tube tip just above the carina. Esophageal tube tip projects over the gastric fundal region and is directed cephalad near the GE junction 2. Streaky left perihilar and basilar atelectasis. Borderline cardiomegaly Electronically Signed   By: Jasmine Pang M.D.   On: 04/18/2018 03:56   Dg Chest 1 View  Result Date: 04/17/2018 CLINICAL DATA:  Right-sided weakness.  Asthma and hypertension EXAM: CHEST  1 VIEW COMPARISON:  None. FINDINGS: The heart size and mediastinal contours are within normal limits. Both lungs are clear. The visualized skeletal structures are unremarkable. IMPRESSION: No active disease. Electronically Signed   By: Signa Kell M.D.   On: 04/17/2018 21:58   Ct Angio  Neck W Or Wo Contrast  Result Date: 04/17/2018 EXAM: CT ANGIOGRAPHY HEAD AND NECK CT PERFUSION BRAIN TECHNIQUE: Multidetector CT imaging of the head and neck was performed using the  standard protocol during bolus administration of intravenous contrast. Multiplanar CT image reconstructions and MIPs were obtained to evaluate the vascular anatomy. Carotid stenosis measurements (when applicable) are obtained utilizing NASCET criteria, using the distal internal carotid diameter as the denominator. Multiphase CT imaging of the brain was performed following IV bolus contrast injection. Subsequent parametric perfusion maps were calculated using RAPID software. CONTRAST:  ISOVUE-370 IOPAMIDOL (ISOVUE-370) INJECTION 76% COMPARISON:  None. FINDINGS: CTA NECK FINDINGS Aortic arch: Standard branching. Imaged portion shows no evidence of aneurysm or dissection. No significant stenosis of the major arch vessel origins. Right carotid system: No evidence of dissection, stenosis (50% or greater) or occlusion. Non stenotic calcific atherosclerosis of the carotid bifurcation. Left carotid system: Left proximal ICA thromboembolism. The ICA downstream to the thrombosis is opacified with contrast suggesting that the thrombus is near occlusive but not totally occlusive. Vertebral arteries: Left dominant. No evidence of dissection, stenosis (50% or greater) or occlusion. Skeleton: No acute finding. Other neck: 7 mm nodule within the left lobe of the thyroid gland. Upper chest: Negative. Review of the MIP images confirms the above findings CTA HEAD FINDINGS Anterior circulation: Non stenotic calcific atherosclerosis of the carotid siphons. Large right A1, large anterior communicating artery, diminutive left A1, normal variant. Patent bilateral ACA and right MCA distributions. Left M1 occlusion with poor left MCA distribution collateralization. Posterior circulation: No significant stenosis, proximal occlusion, aneurysm, or vascular malformation. Venous sinuses: As permitted by contrast timing, patent. Anatomic variants: None significant. Review of the MIP images confirms the above findings CT Brain Perfusion  Findings: CBF (<30%) Volume: 51mL Perfusion (Tmax>6.0s) volume: 50mL Mismatch Volume: 65mL Infarction Location:Left MCA distribution, ASPECTS 8 stroke on noncontrast CT. IMPRESSION: CT brain perfusion: Left MCA distribution large ischemic penumbra, 77 cc. Left frontal white matter and insula core infarct of 11 cc. ASPECTS 8 infarct on noncontrast CT of head with possible pseudonormalization of perfusion. CTA head: 1. Left M1 occlusion.  Poor left MCA distribution collateralization. 2. No additional intracranial large vessel occlusion, high-grade stenosis, aneurysm, or vascular malformation. CTA neck: 1. Left proximal ICA thromboembolism. The ICA downstream to the thrombosis is opacified with contrast suggesting that the thrombus is near occlusive, but not totally occlusive. 2. Patent right carotid system and bilateral vertebral arteries without hemodynamically significant stenosis by NASCET criteria, dissection, or aneurysm. These results were called by telephone at the time of interpretation on 04/17/2018 at 9:24 pm to Dr. Daryel November , who verbally acknowledged these results. Electronically Signed   By: Mitzi Hansen M.D.   On: 04/17/2018 21:35   Mr Brain Wo Contrast  Result Date: 04/18/2018 CLINICAL DATA:  Status post endovascular revascularization of stenotic LEFT ICA. History of hypertension. EXAM: MRI HEAD WITHOUT CONTRAST TECHNIQUE: Multiplanar, multiecho pulse sequences of the brain and surrounding structures were obtained without intravenous contrast. COMPARISON:  None. FINDINGS: INTRACRANIAL CONTENTS: Confluent LEFT frontotemporal, operculum and insula, LEFT basal ganglia reduced diffusion. Patchy areas of reduced diffusion LEFT frontal convexity, LEFT parietal lobe. Areas of reduced diffusion demonstrate low ADC values; microhemorrhages within several areas of acute infarcts. Scattered subcentimeter supratentorial white matter FLAIR T2 hyperintensities exclusive aforementioned  abnormality compatible with mild chronic small vessel ischemic changes. No parenchymal brain volume loss for age. No midline shift. No abnormal extra-axial fluid collections. VASCULAR: Normal major intracranial vascular flow voids  present at skull base. SKULL AND UPPER CERVICAL SPINE: No abnormal sellar expansion. No suspicious calvarial bone marrow signal. Small subgaleal fluid collections. Craniocervical junction maintained. SINUSES/ORBITS: Moderate paranasal sinusitis with air-fluid levels. Mastoid air cells are well aerated.The included ocular globes and orbital contents are non-suspicious. OTHER: Life support lines in place. IMPRESSION: 1. Multifocal acute LEFT MCA territory infarcts and anterior/posterior border zone infarcts with scattered petechial hemorrhage. Electronically Signed   By: Awilda Metro M.D.   On: 04/18/2018 19:30   Ct Cerebral Perfusion W Contrast  Result Date: 04/17/2018 EXAM: CT ANGIOGRAPHY HEAD AND NECK CT PERFUSION BRAIN TECHNIQUE: Multidetector CT imaging of the head and neck was performed using the standard protocol during bolus administration of intravenous contrast. Multiplanar CT image reconstructions and MIPs were obtained to evaluate the vascular anatomy. Carotid stenosis measurements (when applicable) are obtained utilizing NASCET criteria, using the distal internal carotid diameter as the denominator. Multiphase CT imaging of the brain was performed following IV bolus contrast injection. Subsequent parametric perfusion maps were calculated using RAPID software. CONTRAST:  ISOVUE-370 IOPAMIDOL (ISOVUE-370) INJECTION 76% COMPARISON:  None. FINDINGS: CTA NECK FINDINGS Aortic arch: Standard branching. Imaged portion shows no evidence of aneurysm or dissection. No significant stenosis of the major arch vessel origins. Right carotid system: No evidence of dissection, stenosis (50% or greater) or occlusion. Non stenotic calcific atherosclerosis of the carotid bifurcation.  Left carotid system: Left proximal ICA thromboembolism. The ICA downstream to the thrombosis is opacified with contrast suggesting that the thrombus is near occlusive but not totally occlusive. Vertebral arteries: Left dominant. No evidence of dissection, stenosis (50% or greater) or occlusion. Skeleton: No acute finding. Other neck: 7 mm nodule within the left lobe of the thyroid gland. Upper chest: Negative. Review of the MIP images confirms the above findings CTA HEAD FINDINGS Anterior circulation: Non stenotic calcific atherosclerosis of the carotid siphons. Large right A1, large anterior communicating artery, diminutive left A1, normal variant. Patent bilateral ACA and right MCA distributions. Left M1 occlusion with poor left MCA distribution collateralization. Posterior circulation: No significant stenosis, proximal occlusion, aneurysm, or vascular malformation. Venous sinuses: As permitted by contrast timing, patent. Anatomic variants: None significant. Review of the MIP images confirms the above findings CT Brain Perfusion Findings: CBF (<30%) Volume: 11mL Perfusion (Tmax>6.0s) volume: 77mL Mismatch Volume: 66mL Infarction Location:Left MCA distribution, ASPECTS 8 stroke on noncontrast CT. IMPRESSION: CT brain perfusion: Left MCA distribution large ischemic penumbra, 77 cc. Left frontal white matter and insula core infarct of 11 cc. ASPECTS 8 infarct on noncontrast CT of head with possible pseudonormalization of perfusion. CTA head: 1. Left M1 occlusion.  Poor left MCA distribution collateralization. 2. No additional intracranial large vessel occlusion, high-grade stenosis, aneurysm, or vascular malformation. CTA neck: 1. Left proximal ICA thromboembolism. The ICA downstream to the thrombosis is opacified with contrast suggesting that the thrombus is near occlusive, but not totally occlusive. 2. Patent right carotid system and bilateral vertebral arteries without hemodynamically significant stenosis by  NASCET criteria, dissection, or aneurysm. These results were called by telephone at the time of interpretation on 04/17/2018 at 9:24 pm to Dr. Daryel November , who verbally acknowledged these results. Electronically Signed   By: Mitzi Hansen M.D.   On: 04/17/2018 21:35   Dg Chest Port 1 View  Result Date: 04/19/2018 CLINICAL DATA:  Acute respiratory failure EXAM: PORTABLE CHEST 1 VIEW COMPARISON:  Chest radiograph from one day prior. FINDINGS: Endotracheal tube tip is 2.0 cm above the carina. Enteric tube terminates in  the proximal stomach. Stable cardiomediastinal silhouette with top-normal heart size. No pneumothorax. No pleural effusion. Mild platelike left retrocardiac atelectasis, similar. No pulmonary edema. No acute consolidative airspace disease. IMPRESSION: 1. Well-positioned support structures. 2. Mild platelike left retrocardiac atelectasis, similar. Electronically Signed   By: Delbert Phenix M.D.   On: 04/19/2018 09:09   Ct Head Code Stroke Wo Contrast`  Result Date: 04/17/2018 CLINICAL DATA:  Code stroke. 63 y/o F; transient episode of word-finding difficulty with recurrent symptoms including weakness of right arm and leg. EXAM: CT HEAD WITHOUT CONTRAST TECHNIQUE: Contiguous axial images were obtained from the base of the skull through the vertex without intravenous contrast. COMPARISON:  None. FINDINGS: Brain: Mild asymmetric hypoattenuation within the left lateral frontal lobe and anterior insula in comparison with the right which may represent acute stroke. ASPECTS is 8. No hemorrhage or mass effect. Several small chronic infarctions are present within the left parietal cortex. No extra-axial collection, hydrocephalus, or herniation. Vascular: Small density within left sylvian fissure may represent a MCA branch thrombus (series 6, image 11). Skull: Normal. Negative for fracture or focal lesion. Sinuses/Orbits: No acute finding. Other: None. ASPECTS [facility] Stroke Program Early CT  Score) - Ganglionic level infarction (caudate, lentiform nuclei, internal capsule, insula, M1-M3 cortex): 5 - Supraganglionic infarction (M4-M6 cortex): 3 Total score (0-10 with 10 being normal): 8 IMPRESSION: 1. Mild asymmetric hypoattenuation within the left lateral frontal lobe and anterior insula in comparison with the right which may represent acute stroke. No hemorrhage or mass effect. 2. Small density within left Sylvian fissure, possible MCA branch thrombus. 3. ASPECTS is 8. 4. Several small chronic infarctions in left parietal cortex. These results were called by telephone at the time of interpretation on 04/17/2018 at 8:10 pm to Dr. Daryel November , who verbally acknowledged these results. Electronically Signed   By: Mitzi Hansen M.D.   On: 04/17/2018 20:12    Labs:  CBC: Recent Labs    04/17/18 1918 04/18/18 0454 04/19/18 0500  WBC 9.9 12.9* 9.4  HGB 14.1 13.1 10.6*  HCT 43.9 40.6 33.4*  PLT 331 327 252    COAGS: Recent Labs    04/17/18 1918  INR 1.0  APTT 27    BMP: Recent Labs    03/30/18 1100 04/17/18 1918 04/18/18 0454 04/19/18 0500  NA 142 139 136 140  K 3.6 3.5 3.3* 3.1*  CL 102 101 106 112*  CO2 25 27 23 22   GLUCOSE 74 163* 162* 115*  BUN 15 22 16 11   CALCIUM 9.6 8.9 8.3* 8.1*  CREATININE 1.00 1.04* 0.89 0.86  GFRNONAA 61 58* >60 >60  GFRAA 70 >60 >60 >60    LIVER FUNCTION TESTS: Recent Labs    09/22/17 1136 04/17/18 1918  BILITOT 0.3 0.6  AST 14 19  ALT 20 22  ALKPHOS 98 77  PROT 6.2 6.7  ALBUMIN 4.0 3.7    Assessment and Plan:  Left ICA, left MCA, and left ACA occlusions s/p emergent mechanical thrombectomy achieving a TICI 3 revascularization 04/17/2018 by Dr. Corliss Skains. Left ICA stenosis s/p revascularization using stent assisted angioplasty 04/17/2018 by Dr. Corliss Skains. Patient's condition improved- now extubated, can spontaneously move all extremities with some weakness of RUE (can lift off bed against gravity but not fully  extend). Right groin incision stable. Continue taking Brilinta 90 mg twice daily and Aspirin 81 mg once daily. Plan to follow-up with Dr. Corliss Skains in clinic 4 weeks after discharge. Appreciate and agree with neurology and CCM management. IR to  follow.   Electronically Signed: Elwin Mocha, PA-C 04/19/2018, 10:13 AM   I spent a total of 25 Minutes at the the patient's bedside AND on the patient's hospital floor or unit, greater than 50% of which was counseling/coordinating care for left ICA, left MCA and left ACA occlusions s/p revascularization.

## 2018-04-19 NOTE — Anesthesia Postprocedure Evaluation (Signed)
Anesthesia Post Note  Patient: SHAELY PROPER  Procedure(s) Performed: RADIOLOGY WITH ANESTHESIA (N/A )     Patient location during evaluation: ICU Anesthesia Type: General Level of consciousness: awake and alert, oriented and patient cooperative Pain management: pain level controlled Vital Signs Assessment: post-procedure vital signs reviewed and stable Respiratory status: spontaneous breathing, nonlabored ventilation, respiratory function stable and patient connected to nasal cannula oxygen (extubated today) Cardiovascular status: blood pressure returned to baseline and stable Postop Assessment: no apparent nausea or vomiting Anesthetic complications: no Comments: Pt recovering from CVA, moving R arm, speaking, breathing well       Last Vitals:  Vitals:   04/19/18 1800 04/19/18 1900  BP: (!) 122/48 (!) 110/49  Pulse: 73 71  Resp: (!) 28 (!) 27  Temp:    SpO2: 100% 99%    Last Pain:  Vitals:   04/19/18 1600  TempSrc: Oral  PainSc: 0-No pain                 Lataria Courser,E. Macie Baum

## 2018-04-19 NOTE — Procedures (Signed)
Extubation Procedure Note  Patient Details:   Name: ELVEDA WIDEMAN DOB: 29-May-1955 MRN: [id number]   Airway Documentation:    Vent end date: 04/19/18 Vent end time: 0910   Evaluation  O2 sats: stable throughout Complications: No apparent complications Patient did tolerate procedure well. Bilateral Breath Sounds: Clear, Diminished   Yes  Jacqulynn Cadet 04/19/2018, 9:18 AM

## 2018-04-19 NOTE — Progress Notes (Signed)
Rehab Admissions Coordinator Note:  Patient was screened by Trish Mage for appropriateness for an Inpatient Acute Rehab Consult.  At this time, we are recommending Inpatient Rehab consult.  Lelon Frohlich M 04/19/2018, 2:19 PM  I can be reached at [phone].

## 2018-04-19 NOTE — Evaluation (Signed)
Clinical/Bedside Swallow Evaluation Patient Details  Name: Kristin Solis MRN: [id number] Date of Birth: 07-29-1955  Today's Date: 04/19/2018 Time: SLP Start Time (ACUTE ONLY): 1717 SLP Stop Time (ACUTE ONLY): 1736 SLP Time Calculation (min) (ACUTE ONLY): 19 min  Past Medical History:  Past Medical History:  Diagnosis Date  . Asthma   . Hypertension    Past Surgical History:  Past Surgical History:  Procedure Laterality Date  . ABDOMINAL HYSTERECTOMY  2005   total  . IR CT HEAD LTD  04/18/2018  . IR CT HEAD LTD  04/18/2018  . IR INTRAVSC STENT CERV CAROTID W/O EMB-PROT MOD SED INC ANGIO  04/18/2018  . IR PERCUTANEOUS ART THROMBECTOMY/INFUSION INTRACRANIAL INC DIAG ANGIO  04/18/2018  . OOPHORECTOMY    . RADIOLOGY WITH ANESTHESIA N/A 04/17/2018   Procedure: RADIOLOGY WITH ANESTHESIA;  Surgeon: Julieanne Cotton, MD;  Location: [facility] OR;  Service: Radiology;  Laterality: N/A;   HPI:  Pt presented to the ED following acute onset of dysphasia and right hemiparesis. However, on arrival her speech was fluent and she was able to relate to ED staff that she had been trying to tell her husband that a friend of hers daughter had committed suicide last night but she couldn't "get the words out." She further stated that she couldn't find her keys and couldn't "make a sentence" and her husband reported that she was also "slobbering all over herself" at that time. Code stroke was cancelled but subsequently reactivated when she again demonstrated difficulty speaking and dense right hemiparesis. CT head showed mild asymmetric hypoattenuation within the left lateral frontal lobe and anterior insula in comparison with the right which was felt possibly to represent acute stroke. Also noted was a small density within left Sylvian fissure, possible MCA branch thrombus. IV tPA was started and CTA of the neck showed: Left proximal ICA thromboembolism. The ICA downstream to the thrombosis is opacified with contrast suggesting  that the thrombus is near occlusive, but not totally occlusive. She underwent  lt common carotid arteriogram followed by complete endovascular revascularization by IR and was intubated by anesthesia at 22:38 on 04/17/2018 with extubation on 04/19/18 at 0910.    Assessment / Plan / Recommendation Clinical Impression  Pt participated in a bedside swallow evaluation with her husband present. Pt's vocal quality was intermittently wet at baseline and pt was observed to independently cough and utilize suctioning. Pt demonstrated difficulty maintaining alertness for prolonged periods during the evaluation but the pt's husband reported that this is her baseline. Oral mechanism exam revealed right-sided labial weakness as well as reduced lingual range of motion and strength. Pt demonstrated throat clearing and coughing with ice chips, coughing with thin liquids via 1/2 tsp boluses and coughing with three of five 1/2 tsp boluses of puree. Multiple swallows were noted with all consistencies suggesting possible pharyngeal residue. Considering the length of intubation, pt's symptoms are more likely to be related to the CVA than to the intubation. It is recommended that an NPO status be maintained at this time and that a modified barium swallow study be completed to assess the structural and functional integrity of the swallow mechanism. Pt, her husband and nursing have been educated regarding plan of care.  SLP Visit Diagnosis: Dysphagia, oropharyngeal phase (R13.12)    Aspiration Risk  Moderate aspiration risk    Diet Recommendation NPO   Medication Administration: Via alternative means    Other  Recommendations Oral Care Recommendations: Oral care QID   Follow up Recommendations Inpatient  Rehab      Frequency and Duration min 2x/week  2 weeks       Prognosis Prognosis for Safe Diet Advancement: Fair Barriers to Reach Goals: Language deficits;Cognitive deficits;Severity of deficits;Behavior      Swallow  Study   General Date of Onset: 04/19/18 HPI: Pt presented to the ED following acute onset of dysphasia and right hemiparesis. However, on arrival her speech was fluent and she was able to relate to ED staff that she had been trying to tell her husband that a friend of hers daughter had committed suicide last night but she couldn't "get the words out." She further stated that she couldn't find her keys and couldn't "make a sentence" and her husband reported that she was also "slobbering all over herself" at that time. Code stroke was cancelled but subsequently reactivated when she again demonstrated difficulty speaking and dense right hemiparesis. CT head showed mild asymmetric hypoattenuation within the left lateral frontal lobe and anterior insula in comparison with the right which was felt possibly to represent acute stroke. Also noted was a small density within left Sylvian fissure, possible MCA branch thrombus. IV tPA was started and CTA of the neck showed: Left proximal ICA thromboembolism. The ICA downstream to the thrombosis is opacified with contrast suggesting that the thrombus is near occlusive, but not totally occlusive. She underwent  lt common carotid arteriogram followed by complete endovascular revascularization by IR and was intubated by anesthesia at 22:38 on 04/17/2018 with extubation on 04/19/18 at 0910.  Type of Study: Bedside Swallow Evaluation Previous Swallow Assessment: None Diet Prior to this Study: NPO Temperature Spikes Noted: No History of Recent Intubation: Yes Length of Intubations (days): (<48 hours) Date extubated: 04/19/18 Behavior/Cognition: Pleasant mood;Cooperative;Lethargic/Drowsy;Other (Comment)(Unable to maintain alertness for prolonged periods. ) Oral Cavity Assessment: Within Functional Limits Oral Care Completed by SLP: Recent completion by staff Oral Cavity - Dentition: Other (Comment)(Absent maxillary dentition; adequate mandibular) Self-Feeding Abilities: Needs  assist Patient Positioning: Upright in bed;Postural control adequate for testing Baseline Vocal Quality: Low vocal intensity;Breathy Volitional Cough: Weak Volitional Swallow: Able to elicit    Oral/Motor/Sensory Function Overall Oral Motor/Sensory Function: Moderate impairment Facial ROM: Reduced right;Suspected CN VII (facial) dysfunction Facial Symmetry: Within Functional Limits Facial Strength: Reduced right;Suspected CN VII (facial) dysfunction Lingual ROM: Reduced right;Suspected CN XII (hypoglossal) dysfunction Lingual Symmetry: Within Functional Limits Lingual Strength: Reduced;Suspected CN XII (hypoglossal) dysfunction Velum: Within Functional Limits Mandible: Within Functional Limits   Ice Chips Ice chips: Impaired Oral Phase Impairments: Reduced labial seal Oral Phase Functional Implications: Right anterior spillage Pharyngeal Phase Impairments: Cough - Immediate;Cough - Delayed   Thin Liquid Thin Liquid: Impaired Presentation: Spoon Oral Phase Impairments: Reduced labial seal Oral Phase Functional Implications: Right anterior spillage Pharyngeal  Phase Impairments: Cough - Immediate;Cough - Delayed    Nectar Thick Nectar Thick Liquid: Not tested   Honey Thick Honey Thick Liquid: Not tested   Puree Puree: Impaired Presentation: Spoon Pharyngeal Phase Impairments: Cough - Immediate;Cough - Delayed;Throat Clearing - Immediate;Multiple swallows(With 3/5 boluses)   Solid     Solid: Not tested     Ulah Olmo I. Vear Clock, MS, CCC-SLP Acute Rehabilitation Services Office number [phone] Pager [phone]  Scheryl Marten 04/19/2018,5:52 PM

## 2018-04-19 NOTE — Progress Notes (Signed)
63 year old woman admitted 3/1 with acute onset right hemiparesis and dysphagia, received TPA and underwent emergent revascularization of occluded left ICA/MCA and ACA  MRI 3/2 was reviewed which shows evidence of ischemia and left MCA and ACA territory.  On exam-awake follows commands, good strength on the left, power 2-3/5 on right, decreased breath sounds bilateral, S1-S2 regular, soft and nontender abdomen.   Labs show hypokalemia, mild drop in hemoglobin from 13-10.6. Chest x-ray personally reviewed which does not show any new infiltrates  Impression/plan Acute respiratory failure-spontaneous breathing trial on pressure support, she was able to tolerate 5/5, she was extubated to nasal cannula and appears to be tolerating well.  Does have a cough and able to maintain airway currently.  Hypertensive emergency-off Cleviprex drip, lisinopril has been restarted, will use IV hydralazine and labetalol as needed as needed.  Hypokalemia will be repleted Stroke management per neurology, continue soft cervical collar, start PT and will need swallow evaluation  Updated husband at bedside  My critical care time x 32 m  Cyril Mourning MD. [facility]. [address] [facility] Pager [phone] If no response call [phone]   04/19/2018

## 2018-04-19 NOTE — Progress Notes (Signed)
..   NAME:  Kristin Solis, MRN:  [id number], DOB:  08-09-1955, LOS: 2 ADMISSION DATE:  04/17/2018, CONSULTATION DATE:  04/18/2018 REFERRING MD:  Otelia Limes MD, CHIEF COMPLAINT:  acute onset of dysphasia and right hemiparesis  Brief History   63 yo female w/ PMHx asthma and HTN presented with dysphasia and right hemiparesis last seen normal at 7:30 PM/ Found to have an acute large ischemic left MCA stroke s/p TPA s/p revascularization s/p stent assisted angioplasty of severely stenotic left ICA. Remains intubated post procedure. PCCM consulted for vent mgmt  History of present illness   63 yr old female with PMHx significant for Asthma and HTN was at home when she developed acute dysphasia and right hemiparesis.  She was trying to relate a traumatic incident to her husband when she began having trouble with word finding and her husband noticed she was drooling. On presentation to EDP she was initially normal than had a recurrence of dysphasia with right hemiparesis. CTH showed acute ischemic CVA and pt received TPA at 20:32  Pt had imaging completed and was evaluated by IR for revascularization. Was intubated by Anesthesia at 22:38 on 04/17/2018 with a 7.5 ETT Pt received a lt common carotid arteriogram followed bycomplete  endovascular revascularization of occluded Lt MCA,Lt ACA aand Lt ICA terminus with x 1 pass with 5 mm x 33 mm embotrap retriver device achieving a TICI 3 revascularization. S/P stent assisted angioplasty of  severely stenotic Lt ICA at the bulb . Patient loaded with 180 m8 of brilinta and 81 mg of aspirin . Also received 3 mg of IA integrelin Transferred to Neuro ICU intubated and PCCM consulted for vent mgmt 04/19/2018 plan for extubation. Past Medical History  .[name] Active Ambulatory Problems    Diagnosis Date Noted  . Essential hypertension, benign 01/15/2017  . Asthma 01/15/2017  . IFG (impaired fasting glucose) 03/28/2018   Resolved Ambulatory Problems    Diagnosis Date Noted    . Encounter to establish care 01/15/2017   Past Medical History:  Diagnosis Date  . Hypertension     Consults:  Interventional: 04/17/2018 Anesthesia: 04/17/2018 PCCM 04/18/2018  Procedures:  Endotracheally intubated on 04/17/2018 Cerebral Angiogram- 04/18/2018 Revascularization of occluded Left MCA, ACA and ICA and stenting of left ICA- 04/18/2018  Significant Diagnostic Tests:  CT brain perfusion: Left MCA distribution large ischemic penumbra, 77 cc. Left frontal white matter and insula core infarct of 11 cc. ASPECTS 8 infarct on noncontrast CT of head with possible pseudonormalization of perfusion.  CTA head: 1. Left M1 occlusion. Poor left MCA distribution collateralization. 2. No additional intracranial large vessel occlusion, high-grade stenosis, aneurysm, or vascular malformation.  CTA neck: 1. Left proximal ICA thromboembolism. The ICA downstream to the thrombosis is opacified with contrast suggesting that the thrombus is near occlusive, but not totally occlusive. 2. Patent right carotid system and bilateral vertebral arteries without hemodynamically significant stenosis by NASCET criteria, dissection, or aneurysm.  Micro Data:  none  Antimicrobials:  Ancef - 04/17/2018    Objective   Blood pressure 111/77, pulse (!) 56, temperature 99.4 F (37.4 C), temperature source Axillary, resp. rate 16, SpO2 100 %.    Vent Mode: PRVC FiO2 (%):  [40 %] 40 % Set Rate:  [16 bmp] 16 bmp Vt Set:  [440 mL] 440 mL PEEP:  [5 cmH20] 5 cmH20 Pressure Support:  [5 cmH20-10 cmH20] 10 cmH20 Plateau Pressure:  [15 cmH20-19 cmH20] 17 cmH20   Intake/Output Summary (Last 24 hours) at 04/19/2018 0802 Last  data filed at 04/19/2018 0600 Gross per 24 hour  Intake 1085 ml  Output -  Net 1085 ml   There were no vitals filed for this visit.  Examination: General: 63 year old female currently awake follows commands nods appropriately. HEENT: Endotracheal orogastric tube in place.  She has  wrap around her neck where she has had carotid interventions. Neuro: Follows commands some hemiparesis in the right arm CV: Heart sounds regular regular rate and rhythm PULM: Decreased breath sounds in the bases ZO:XWRUGI:soft, non-tender, bsx4 active  Extremities: warm/dry, 2-3+ edema  Skin: no rashes or lesions   Assessment & Plan:  1. Acute Encephalopathy : dysphasia and right sided hemiparesis secondary to large left ischemic MCA stroke s/p revascularization Plan: From neurology Hold sedation for attempted extubation No antiplatelet therapy for at least 24 hours post TPA Blood pressure control as needed  2. Acute respiratory failure secondary to neurologic injury Protection of airway Endotracheally intubated Plan: 04/19/2018 wean per protocol Will attempt extubation on 04/19/2018   Hypokalemia Recent Labs  Lab 04/17/18 1918 04/18/18 0454 04/19/18 0500  K 3.5 3.3* 3.1*   Replete potassium Monitor electrolytes  Best practice:  Diet: NPO Pain/Anxiety/Delirium protocol (if indicated): propofol and Fentanyl VAP protocol (if indicated): yes DVT prophylaxis: per primary  GI prophylaxis: PPI Glucose control: ISS if BG exceeds 180mg /dl Mobility: bedrest Code Status: Full Family thousand 20 no family at bedside currently Disposition: ICU admitted under Neurology with PCCM consulted for vent mgmt  Labs   CBC: Recent Labs  Lab 04/17/18 1918 04/18/18 0454 04/19/18 0500  WBC 9.9 12.9* 9.4  NEUTROABS 6.3 12.0*  --   HGB 14.1 13.1 10.6*  HCT 43.9 40.6 33.4*  MCV 89.4 88.3 90.8  PLT 331 327 252    Basic Metabolic Panel: Recent Labs  Lab 04/17/18 1918 04/18/18 0454 04/19/18 0500  NA 139 136 140  K 3.5 3.3* 3.1*  CL 101 106 112*  CO2 27 23 22   GLUCOSE 163* 162* 115*  BUN 22 16 11   CREATININE 1.04* 0.89 0.86  CALCIUM 8.9 8.3* 8.1*   GFR: Estimated Creatinine Clearance: 82.6 mL/min (by C-G formula based on SCr of 0.86 mg/dL). Recent Labs  Lab 04/17/18 1918  04/18/18 0454 04/19/18 0500  WBC 9.9 12.9* 9.4    Liver Function Tests: Recent Labs  Lab 04/17/18 1918  AST 19  ALT 22  ALKPHOS 77  BILITOT 0.6  PROT 6.7  ALBUMIN 3.7   No results for input(s): LIPASE, AMYLASE in the last 168 hours. No results for input(s): AMMONIA in the last 168 hours.  ABG    Component Value Date/Time   O2SAT 41.8 04/17/2018 1944     Coagulation Profile: Recent Labs  Lab 04/17/18 1918  INR 1.0    Cardiac Enzymes: No results for input(s): CKTOTAL, CKMB, CKMBINDEX, TROPONINI in the last 168 hours.  HbA1C: Hgb A1c MFr Bld  Date/Time Value Ref Range Status  04/18/2018 04:54 AM 5.7 (H) 4.8 - 5.6 % Final    Comment:    (NOTE) Pre diabetes:          5.7%-6.4% Diabetes:              >6.4% Glycemic control for   <7.0% adults with diabetes   03/30/2018 11:00 AM 5.7 (H) 4.8 - 5.6 % Final    Comment:             Prediabetes: 5.7 - 6.4          Diabetes: >6.4  Glycemic control for adults with diabetes: <7.0     CBG: Recent Labs  Lab 04/17/18 2009  GLUCAP 122*      App Critical care time: 30 mins    Brett Canales  ACNP [facility] PCCM Pager [phone] till 1 pm If no answer page [phone] 04/19/2018, 8:03 AM

## 2018-04-19 NOTE — Progress Notes (Addendum)
OT Cancellation Note  Patient Details Name: Kristin Solis MRN: [id number] DOB: 1955/11/10   Cancelled Treatment:    Reason Eval/Treat Not Completed: Patient not medically ready. Active bedrest orders. (Intubated. Will return as schedule allows. Thank you.)  Kelena Garrow M Collis Thede Bayden Gil MSOT, OTR/L Acute Rehab Pager: [phone] Office: [phone] 04/19/2018, 8:29 AM

## 2018-04-19 NOTE — Progress Notes (Signed)
STROKE TEAM PROGRESS NOTE   INTERVAL HISTORY Her son is at the bedside.  She was extubated yesterday and is doing well. She is awake and following commands and moving the right side. Blood pressure is adequately controlled.MRI scan of the brain done yesterday shows patchy left MCA branch infarct.  Vitals:   04/19/18 1400 04/19/18 1425 04/19/18 1500 04/19/18 1600  BP: (!) 109/58  (!) 114/49 (!) 111/58  Pulse: 77 75 75 74  Resp: (!) 35 (!) 32 (!) 34 (!) 37  Temp:    (!) 100.6 F (38.1 C)  TempSrc:    Oral  SpO2: 99% 99% 100% 100%    CBC:  Recent Labs  Lab 04/17/18 1918 04/18/18 0454 04/19/18 0500  WBC 9.9 12.9* 9.4  NEUTROABS 6.3 12.0*  --   HGB 14.1 13.1 10.6*  HCT 43.9 40.6 33.4*  MCV 89.4 88.3 90.8  PLT 331 327 657    Basic Metabolic Panel:  Recent Labs  Lab 04/18/18 0454 04/19/18 0500  NA 136 140  K 3.3* 3.1*  CL 106 112*  CO2 23 22  GLUCOSE 162* 115*  BUN 16 11  CREATININE 0.89 0.86  CALCIUM 8.3* 8.1*   Lipid Panel:     Component Value Date/Time   CHOL 186 04/18/2018 0454   CHOL 196 03/26/2017 1437   TRIG 90 04/18/2018 0454   TRIG 97 04/18/2018 0454   HDL 66 04/18/2018 0454   HDL 73 03/26/2017 1437   CHOLHDL 2.8 04/18/2018 0454   VLDL 18 04/18/2018 0454   LDLCALC 102 (H) 04/18/2018 0454   LDLCALC 107 (H) 03/26/2017 1437   HgbA1c:  Lab Results  Component Value Date   HGBA1C 5.7 (H) 04/18/2018   Urine Drug Screen: No results found for: LABOPIA, COCAINSCRNUR, LABBENZ, AMPHETMU, THCU, LABBARB  Alcohol Level     Component Value Date/Time   ETH <10 04/17/2018 1918    IMAGING Ct Head Code Stroke Wo Contrast` 04/17/2018 1. Mild asymmetric hypoattenuation within the left lateral frontal lobe and anterior insula in comparison with the right which may represent acute stroke. No hemorrhage or mass effect. 2. Small density within left Sylvian fissure, possible MCA branch thrombus. 3. ASPECTS is 8. 4. Several small chronic infarctions in left parietal  cortex.   CTA head 04/17/2018 1. Left M1 occlusion.  Poor left MCA distribution collateralization. 2. No additional intracranial large vessel occlusion, high-grade stenosis, aneurysm, or vascular malformation.   CTA neck 04/17/2018 1. Left proximal ICA thromboembolism. The ICA downstream to the thrombosis is opacified with contrast suggesting that the thrombus is near occlusive, but not totally occlusive. 2. Patent right carotid system and bilateral vertebral arteries without hemodynamically significant stenosis   Ct Cerebral Perfusion W Contrast 04/17/2018 Left MCA distribution large ischemic penumbra, 77 cc. Left frontal white matter and insula core infarct of 11 cc. ASPECTS 8 infarct on noncontrast CT of head with possible pseudonormalization of perfusion.   Cerebral angiogram S/P lt common carotid arteriogram foll;owed by endovascular complete revascularization of occluded lt mac,Lt ACA aand Lt ICA terminus with x 1 pass with 5 mm x 33 mm embotrap retriver device achieving a TICI 3 revascularization. S/P stent assisted angioplasty of  severely stenotic Lt ICA at the bulb . Patient loaded with 180 m8 of brilinta and 81 mg of aspirin . Also received 3 mg of IA integrelin.     MRI brain 04/18/2018 Patchy left MCA branch infarct.  Dg Chest 1 View 04/18/2018 1. Endotracheal tube tip just above the  carina. Esophageal tube tip projects over the gastric fundal region and is directed cephalad near the GE junction 2. Streaky left perihilar and basilar atelectasis. Borderline cardiomegaly   Dg Chest 1 View 04/17/2018 No active disease.     PHYSICAL EXAM Obese middle-aged Caucasian lady who is not in distress . Afebrile. Head is nontraumatic. Neck is supple without bruit.    Cardiac exam no murmur or gallop. Lungs are clear to auscultation. Distal pulses are well felt. Neurological Exam :  She is awake and alert mild nonfluent aphasia  Follows commands very well. Pupils equal reactive. Fundi not  visualized.   Tongue is midline. Motor system exam shows right hemiparesis with right upper and lower extremity drift with grade 3-4/5 strength. Weakness of right grip and intrinsic hand muscles. Normal strength in the left side. Tone is diminished on the right. Deep tendon reflexes are depressed on the right compared to the left. Right plantar is upgoing left is downgoing.  ASSESSMENT/PLAN Kristin Solis is a 63 y.o. female with history of asthma and HTN presenting to [facility] with dysphasia and R HP which occurred while talking about a traumatic event.  The event totally resolved.  While in the ED, she began having difficulty speaking again along with dense right hemiparesis.  She received tPA 04/17/2018 at 2032. CTA showed a dense LM1 occlusion with large ischemic penumbra on perfusion and CT a neck showed a left ICA embolus.  She was transferred to [facility] and sent to IR for mechanical thrombectomy where she had TICI 3 reperfusion using ambo trap and stent assisted angioplasty of severely stenotic L ICA at the bulb.  Stroke:  left MCA infarct s/p tPA and IR with ICA rescue stent placement, infarct likely d/t large vessel disease  extracranial carotid stenosis  Code Stroke CT head mild asymmetric hypoattenuation within the left lateral frontal lobe and anterior insula.  Small density left sylvian fissure possible MCA branch thrombus.  Several small chronic infarcts left parietal cortex.  ASPECTS 18    CTA head L M1 occlusion.  Poor L MCA distribution collateralization.  CTA neck left proximal ICA thromboembolism with downstream opacified digesting thrombus is near occlusive.  CT perfusion left MCA large ischemic penumbra 7 the 7 cc.  Left frontal white matter and insula core infarct 11 cc.  Angio occluded L MCA, L ACA and L ICA terminus with x 1 pass embotrap w/ TICI 3 revascularization. stent assisted angioplasty of  severely stenotic Lt ICA bulb . received 3 mg of IA integrelin.  Post IR CT  no ICH,mass effect or midline shift noted  24h CT head  Not done MRI  Multifocal acute LEFT MCA territory infarcts and anterior/posterior border zone infarcts with scattered petechial  hemorrhage.   2D Echo  Normal ejection fraction. No cord expansile embolism.  LDL 102  HgbA1c 5.7  SCDs for VTE prophylaxis  No antithrombotic prior to admission, now on aspirin 81 mg daily and Brilinta (ticagrelor) 90 mg bid following load. Continue at d/c.  Therapy recommendations:  pending   Disposition:  pending   Acute respiratory failure  Intubated for procedure  Currently weaning  CCM following  Dysphagia   Secondary to stroke and or intubation   Currently n.p.o.   Assess swallow post extubation  Hypertensive emergency  Treated with Cleviprex   Home medications resumed   Blood pressure goal per post TPA protocol   wean Cleviprex as able  . Long-term BP goal normotensive  Hyperlipidemia  Home  meds: No statin  LDL 102, goal < 70  Add statin once able to swallow  Other Stroke Risk Factors  Obesity, There is no height or weight on file to calculate BMI., recommend weight loss, diet and exercise as appropriate   Family hx stroke (son x 2)  Other Active Problems  Hypokalemia 3.3, repeat in am  Hospital day # 2    She presented with aphasia and right hemiparesis due to left MCA infarct symptomatic from proximal left ICA stenosis and underwent acute revascularization requiring distal left carotid stent followed by successful mechanical thrombectomy and complete revascularization. Her neurological exam is showing improvement. Discussed with the patient's son at the bedside and with Dr. Elsworth Soho This patient is critically ill and at significant risk of neurological worsening, death and care requires constant monitoring of vital signs, hemodynamics,respiratory and cardiac monitoring, extensive review of multiple databases, frequent neurological assessment, discussion with  family, other specialists and medical decision making of high complexity.I have made any additions or clarifications directly to the above note.This critical care time does not reflect procedure time, or teaching time or supervisory time of PA/NP/Med Resident etc but could involve care discussion time.  I spent 35 minutes of neurocritical care time  in the care of  this patient.     Kristin Contras, MD Medical Director [facility] Pager: [phone] 04/19/2018 5:18 PM  To contact Stroke Continuity provider, please refer to [email or web address] After hours, contact General Neurology

## 2018-04-19 NOTE — Progress Notes (Signed)
PT Cancellation Note  Patient Details Name: IEASHA HNAT MRN: [id number] DOB: 12-Dec-1955   Cancelled Treatment:    Reason Eval/Treat Not Completed: Patient not medically ready;Active bedrest order   Enedina Finner Alonza Knisley 04/19/2018, 6:56 AM  Delaney Meigs, PT Acute Rehabilitation Services Pager: [phone] Office: [phone]

## 2018-04-19 NOTE — Evaluation (Signed)
Physical Therapy Evaluation Patient Details Name: Kristin Solis MRN: [id number] DOB: [birth year] Today's Date: 04/19/2018   History of Present Illness  64 yo admitted with dysphagia and right weakness from [facility] s/p tPA with Left ACA/ICA/MCA occlusion s/p thrombectomy 3/1. pt extubated 3/3. PMhx: HTN, asthma  Clinical Impression  Pt in bed upon arrival with spouse in the room. Pt presents with decreased strength, ROM, balance and activity tolerance limiting her functional mobility. Pt able to tolerate sitting edge of bed and three standing trials on room air with O2 sats 93% or greater throughout session. Pt would benefit from skilled acute therapy to address deficits in order to improve independence and allow for safe return home and to work. Pt and spouse educated on plan and consented.     Follow Up Recommendations CIR;Supervision/Assistance - 24 hour    Equipment Recommendations  Other (comment)(TBD)    Recommendations for Other Services OT consult;Rehab consult     Precautions / Restrictions Precautions Precautions: Fall Precaution Comments: RUE weakness, SBP <160      Mobility  Bed Mobility Overal bed mobility: Needs Assistance Bed Mobility: Supine to Sit;Sit to Supine     Supine to sit: Mod assist;+2 for physical assistance Sit to supine: Mod assist;+2 for physical assistance   General bed mobility comments: pt iniated transfer and was able to get legs to edge required assistance with getting LEs fully off edge of bed and to square up EOB, handheld for supporting torso, pt required mod assist for LEs for getting back in bed, pt required max assist x2 to scoot up in bed once in supine   Transfers Overall transfer level: Needs assistance Equipment used: 2 person hand held assist Transfers: Sit to/from Stand Sit to Stand: Mod assist;+2 physical assistance         General transfer comment: pt required blocking of knees and facilitation to come into full standing, pt  able to slowly slide foot to side step towards recliner and EOB, pt performed three sit to stands from bed  Ambulation/Gait                Stairs            Wheelchair Mobility    Modified Rankin (Stroke Patients Only) Modified Rankin (Stroke Patients Only) Pre-Morbid Rankin Score: No symptoms Modified Rankin: Moderately severe disability     Balance Overall balance assessment: Needs assistance Sitting-balance support: Single extremity supported;Feet supported Sitting balance-Leahy Scale: Poor Sitting balance - Comments: pt able to sit EOB with single extremity supported, pt often sitting with right lateral lean but multiple times self corrected to upright posture without cuing    Standing balance support: Bilateral upper extremity supported Standing balance-Leahy Scale: Poor                               Pertinent Vitals/Pain Pain Assessment: No/denies pain    Home Living Family/patient expects to be discharged to:: Private residence Living Arrangements: Spouse/significant other;Children Available Help at Discharge: Family;Available 24 hours/day Type of Home: House Home Access: Stairs to enter Entrance Stairs-Rails: None Entrance Stairs-Number of Steps: 2 Home Layout: Two level;Able to live on main level with bedroom/bathroom Home Equipment: None      Prior Function Level of Independence: Independent               Hand Dominance   Dominant Hand: Right    Extremity/Trunk Assessment   Upper Extremity Assessment Upper  Extremity Assessment: Defer to OT evaluation    Lower Extremity Assessment Lower Extremity Assessment: RLE deficits/detail;LLE deficits/detail RLE Deficits / Details: grossly 3/5 pt able to perform short arc quad in bed, tolerate weight in standing, perform seated marching. Did not assess against resistance this session LLE Deficits / Details: grossly 4/5     Cervical / Trunk Assessment Cervical / Trunk Assessment:  Kyphotic  Communication   Communication: Expressive difficulties  Cognition Arousal/Alertness: Awake/alert Behavior During Therapy: Flat affect Overall Cognitive Status: Within Functional Limits for tasks assessed                                 General Comments: pt very soft spoken and difficult to understand at times, disoriented to day of week but otherwise oriented      General Comments      Exercises     Assessment/Plan    PT Assessment Patient needs continued PT services  PT Problem List Decreased strength;Decreased mobility;Decreased knowledge of use of DME;Decreased activity tolerance;Decreased coordination;Decreased safety awareness;Obesity       PT Treatment Interventions DME instruction;Functional mobility training;Balance training;Patient/family education;Gait training;Therapeutic activities;Neuromuscular re-education;Therapeutic exercise;Stair training    PT Goals (Current goals can be found in the Care Plan section)  Acute Rehab PT Goals Patient Stated Goal: return to work, hanging out at home PT Goal Formulation: With patient/family Time For Goal Achievement: 05/03/18 Potential to Achieve Goals: Good    Frequency Min 4X/week   Barriers to discharge        Co-evaluation               AM-PAC PT "6 Clicks" Mobility  Outcome Measure Help needed turning from your back to your side while in a flat bed without using bedrails?: A Little Help needed moving from lying on your back to sitting on the side of a flat bed without using bedrails?: A Little Help needed moving to and from a bed to a chair (including a wheelchair)?: A Lot Help needed standing up from a chair using your arms (e.g., wheelchair or bedside chair)?: A Lot Help needed to walk in hospital room?: A Lot Help needed climbing 3-5 steps with a railing? : Total 6 Click Score: 13    End of Session Equipment Utilized During Treatment: Gait belt Activity Tolerance: Patient  tolerated treatment well Patient left: in bed;with call bell/phone within reach;with family/visitor present;with nursing/sitter in room(in chair position due to pt declining OOB to chair once EOB) Nurse Communication: Mobility status;Precautions PT Visit Diagnosis: Other abnormalities of gait and mobility (R26.89);Muscle weakness (generalized) (M62.81);Other symptoms and signs involving the nervous system (R29.898)    Time: 1244-1316 PT Time Calculation (min) (ACUTE ONLY): 32 min   Charges:   PT Evaluation $PT Eval Moderate Complexity: 1 Mod PT Treatments $Therapeutic Activity: 8-22 mins       Prescious [place], [place] [phone]   Shenise Wolgamott 04/19/2018, 1:56 PM

## 2018-04-20 ENCOUNTER — Encounter (HOSPITAL_COMMUNITY): Payer: Self-pay

## 2018-04-20 ENCOUNTER — Inpatient Hospital Stay (HOSPITAL_COMMUNITY): Payer: BLUE CROSS/BLUE SHIELD

## 2018-04-20 DIAGNOSIS — I161 Hypertensive emergency: Secondary | ICD-10-CM

## 2018-04-20 LAB — CBC WITH DIFFERENTIAL/PLATELET
Abs Immature Granulocytes: 0.06 10*3/uL (ref 0.00–0.07)
BASOS ABS: 0 10*3/uL (ref 0.0–0.1)
Basophils Relative: 0 %
Eosinophils Absolute: 0.1 10*3/uL (ref 0.0–0.5)
Eosinophils Relative: 1 %
HCT: 36 % (ref 36.0–46.0)
Hemoglobin: 11.6 g/dL — ABNORMAL LOW (ref 12.0–15.0)
Immature Granulocytes: 1 %
Lymphocytes Relative: 6 %
Lymphs Abs: 0.6 10*3/uL — ABNORMAL LOW (ref 0.7–4.0)
MCH: 29 pg (ref 26.0–34.0)
MCHC: 32.2 g/dL (ref 30.0–36.0)
MCV: 90 fL (ref 80.0–100.0)
Monocytes Absolute: 0.9 10*3/uL (ref 0.1–1.0)
Monocytes Relative: 8 %
Neutro Abs: 9.8 10*3/uL — ABNORMAL HIGH (ref 1.7–7.7)
Neutrophils Relative %: 84 %
PLATELETS: 231 10*3/uL (ref 150–400)
RBC: 4 MIL/uL (ref 3.87–5.11)
RDW: 13.2 % (ref 11.5–15.5)
WBC: 11.5 10*3/uL — AB (ref 4.0–10.5)
nRBC: 0 % (ref 0.0–0.2)

## 2018-04-20 LAB — BASIC METABOLIC PANEL
Anion gap: 8 (ref 5–15)
BUN: 14 mg/dL (ref 8–23)
CO2: 23 mmol/L (ref 22–32)
Calcium: 8.5 mg/dL — ABNORMAL LOW (ref 8.9–10.3)
Chloride: 110 mmol/L (ref 98–111)
Creatinine, Ser: 0.96 mg/dL (ref 0.44–1.00)
GFR calc Af Amer: >60 mL/min (ref >60)
Glucose, Bld: 93 mg/dL (ref 70–99)
Potassium: 3.4 mmol/L — ABNORMAL LOW (ref 3.5–5.1)
Sodium: 141 mmol/L (ref 135–145)

## 2018-04-20 LAB — MAGNESIUM: Magnesium: 1.9 mg/dL (ref 1.7–2.4)

## 2018-04-20 LAB — PHOSPHORUS: Phosphorus: 2.2 mg/dL — ABNORMAL LOW (ref 2.5–4.6)

## 2018-04-20 MED ORDER — POTASSIUM PHOSPHATES 15 MMOLE/5ML IV SOLN
30.0000 mmol | Freq: Once | INTRAVENOUS | Status: AC
  Administered 2018-04-20: 30 mmol via INTRAVENOUS
  Filled 2018-04-20: qty 10

## 2018-04-20 NOTE — Progress Notes (Signed)
Inpatient Rehabilitation-Admissions Coordinator    Met with patient and her husband at the bedside to discuss team's recommendation for inpatient rehabilitation. Shared booklets, expectations while in CIR, expected length of stay, and anticipated functional level at DC. They want to pursue CIR at this time. AC will begin insurance authorization process for possible admit.   Please call if questions.   Jhonnie Garner, OTR/L  Rehab Admissions Coordinator  [phone] 04/20/2018 1:57 PM

## 2018-04-20 NOTE — Progress Notes (Signed)
STROKE TEAM PROGRESS NOTE   INTERVAL HISTORY Her son and husband are at the bedside.    She is awake and following commands and moving the right side. Blood pressure is adequately controlled.   Vitals:   04/20/18 1100 04/20/18 1200 04/20/18 1300 04/20/18 1400  BP: 119/67 (!) 108/54 (!) 112/59 110/61  Pulse: 74 69 64 61  Resp: (!) 29  (!) 21 (!) 28  Temp:  98.6 F (37 C)    TempSrc:  Oral    SpO2: 96% 95% 98% 98%    CBC:  Recent Labs  Lab 04/18/18 0454 04/19/18 0500 04/20/18 0615  WBC 12.9* 9.4 11.5*  NEUTROABS 12.0*  --  9.8*  HGB 13.1 10.6* 11.6*  HCT 40.6 33.4* 36.0  MCV 88.3 90.8 90.0  PLT 327 252 778    Basic Metabolic Panel:  Recent Labs  Lab 04/19/18 0500 04/20/18 0615  NA 140 141  K 3.1* 3.4*  CL 112* 110  CO2 22 23  GLUCOSE 115* 93  BUN 11 14  CREATININE 0.86 0.96  CALCIUM 8.1* 8.5*  MG  --  1.9  PHOS  --  2.2*   Lipid Panel:     Component Value Date/Time   CHOL 186 04/18/2018 0454   CHOL 196 03/26/2017 1437   TRIG 90 04/18/2018 0454   TRIG 97 04/18/2018 0454   HDL 66 04/18/2018 0454   HDL 73 03/26/2017 1437   CHOLHDL 2.8 04/18/2018 0454   VLDL 18 04/18/2018 0454   LDLCALC 102 (H) 04/18/2018 0454   LDLCALC 107 (H) 03/26/2017 1437   HgbA1c:  Lab Results  Component Value Date   HGBA1C 5.7 (H) 04/18/2018   Urine Drug Screen: No results found for: LABOPIA, COCAINSCRNUR, LABBENZ, AMPHETMU, THCU, LABBARB  Alcohol Level     Component Value Date/Time   ETH <10 04/17/2018 1918    IMAGING Ct Head Code Stroke Wo Contrast` 04/17/2018 1. Mild asymmetric hypoattenuation within the left lateral frontal lobe and anterior insula in comparison with the right which may represent acute stroke. No hemorrhage or mass effect. 2. Small density within left Sylvian fissure, possible MCA branch thrombus. 3. ASPECTS is 8. 4. Several small chronic infarctions in left parietal cortex.   CTA head 04/17/2018 1. Left M1 occlusion.  Poor left MCA distribution  collateralization. 2. No additional intracranial large vessel occlusion, high-grade stenosis, aneurysm, or vascular malformation.   CTA neck 04/17/2018 1. Left proximal ICA thromboembolism. The ICA downstream to the thrombosis is opacified with contrast suggesting that the thrombus is near occlusive, but not totally occlusive. 2. Patent right carotid system and bilateral vertebral arteries without hemodynamically significant stenosis   Ct Cerebral Perfusion W Contrast 04/17/2018 Left MCA distribution large ischemic penumbra, 77 cc. Left frontal white matter and insula core infarct of 11 cc. ASPECTS 8 infarct on noncontrast CT of head with possible pseudonormalization of perfusion.   Cerebral angiogram S/P lt common carotid arteriogram foll;owed by endovascular complete revascularization of occluded lt mac,Lt ACA aand Lt ICA terminus with x 1 pass with 5 mm x 33 mm embotrap retriver device achieving a TICI 3 revascularization. S/P stent assisted angioplasty of  severely stenotic Lt ICA at the bulb . Patient loaded with 180 m8 of brilinta and 81 mg of aspirin . Also received 3 mg of IA integrelin.     MRI brain 04/18/2018 Patchy left MCA branch infarct.  Dg Chest 1 View 04/18/2018 1. Endotracheal tube tip just above the carina. Esophageal tube tip projects over  the gastric fundal region and is directed cephalad near the GE junction 2. Streaky left perihilar and basilar atelectasis. Borderline cardiomegaly   Dg Chest 1 View 04/17/2018 No active disease.     PHYSICAL EXAM Obese middle-aged Caucasian lady who is not in distress . Afebrile. Head is nontraumatic. Neck is supple without bruit.    Cardiac exam no murmur or gallop. Lungs are clear to auscultation. Distal pulses are well felt. Neurological Exam :  She is awake and alert mild nonfluent aphasia  Follows commands very well. Pupils equal reactive. Fundi not visualized.   Tongue is midline. Motor system exam shows right hemiparesis with right  upper and lower extremity drift with grade 3-4/5 strength. Weakness of right grip and intrinsic hand muscles. Normal strength in the left side. Tone is diminished on the right. Deep tendon reflexes are depressed on the right compared to the left. Right plantar is upgoing left is downgoing.  ASSESSMENT/PLAN Kristin Solis is a 63 y.o. female with history of asthma and HTN presenting to [facility] with dysphasia and R HP which occurred while talking about a traumatic event.  The event totally resolved.  While in the ED, she began having difficulty speaking again along with dense right hemiparesis.  She received tPA 04/17/2018 at 2032. CTA showed a dense LM1 occlusion with large ischemic penumbra on perfusion and CT a neck showed a left ICA embolus.  She was transferred to [facility] and sent to IR for mechanical thrombectomy where she had TICI 3 reperfusion using ambo trap and stent assisted angioplasty of severely stenotic L ICA at the bulb.  Stroke:  left MCA infarct s/p tPA and IR with ICA rescue stent placement, infarct likely d/t large vessel disease  extracranial carotid stenosis  Code Stroke CT head mild asymmetric hypoattenuation within the left lateral frontal lobe and anterior insula.  Small density left sylvian fissure possible MCA branch thrombus.  Several small chronic infarcts left parietal cortex.  ASPECTS 18    CTA head L M1 occlusion.  Poor L MCA distribution collateralization.  CTA neck left proximal ICA thromboembolism with downstream opacified digesting thrombus is near occlusive.  CT perfusion left MCA large ischemic penumbra 7 the 7 cc.  Left frontal white matter and insula core infarct 11 cc.  Angio occluded L MCA, L ACA and L ICA terminus with x 1 pass embotrap w/ TICI 3 revascularization. stent assisted angioplasty of  severely stenotic Lt ICA bulb . received 3 mg of IA integrelin.  Post IR CT no ICH,mass effect or midline shift noted  24h CT head  Not done MRI  Multifocal  acute LEFT MCA territory infarcts and anterior/posterior border zone infarcts with scattered petechial  hemorrhage.   2D Echo  Normal ejection fraction. No cord expansile embolism.  LDL 102  HgbA1c 5.7  SCDs for VTE prophylaxis  No antithrombotic prior to admission, now on aspirin 81 mg daily and Brilinta (ticagrelor) 90 mg bid following load. Continue at d/c.  Therapy recommendations:  pending   Disposition:  pending   Acute respiratory failure  Intubated for procedure  Currently weaning  CCM following  Dysphagia   Secondary to stroke and or intubation   Currently n.p.o.   Assess swallow post extubation  Hypertensive emergency  Treated with Cleviprex   Home medications resumed   Blood pressure goal per post TPA protocol   wean Cleviprex as able  . Long-term BP goal normotensive  Hyperlipidemia  Home meds: No statin  LDL 102,  goal < 70  Add statin once able to swallow  Other Stroke Risk Factors  Obesity, There is no height or weight on file to calculate BMI., recommend weight loss, diet and exercise as appropriate   Family hx stroke (son x 2)  Other Active Problems  Hypokalemia 3.3, repeat in am  Hospital day # 3    She presented with aphasia and right hemiparesis due to left MCA infarct symptomatic from proximal left ICA stenosis and underwent acute revascularization requiring distal left carotid stent followed by successful mechanical thrombectomy and complete revascularization. Her neurological exam is showing improvement. Discussed with the patient's son at the bedside Plan transfer to neurology floor bed today. Mobilize out of bed. Rehabilitation consults. This patient is critically ill and at significant risk of neurological worsening, death and care requires constant monitoring of vital signs, hemodynamics,respiratory and cardiac monitoring, extensive review of multiple databases, frequent neurological assessment, discussion with family, other  specialists and medical decision making of high complexity.I have made any additions or clarifications directly to the above note.This critical care time does not reflect procedure time, or teaching time or supervisory time of PA/NP/Med Resident etc but could involve care discussion time.  I spent 35 minutes of neurocritical care time  in the care of  this patient.     Antony Contras, MD Medical Director [place] Pager: [phone] 04/20/2018 2:46 PM  To contact Stroke Continuity provider, please refer to [email or web address] After hours, contact General Neurology

## 2018-04-20 NOTE — Progress Notes (Signed)
Physical Therapy Treatment Patient Details Name: Kristin Solis MRN: [id number] DOB: 03-20-1955 Today's Date: 04/20/2018    History of Present Illness 63 yo admitted with dysphagia and right weakness from [facility] s/p tPA with Left ACA/ICA/MCA occlusion s/p thrombectomy 3/1. pt extubated 3/3. PMhx: HTN, asthma    PT Comments    Pt progressing well. Pt with increased R LE strength and no buckling during standing activity today. Pt and spouse given LE HEP and complete glut squeezes, quad sets, and ankle pumps  X 10 reps, spouse instructed to assist pt, spouse agreeable. Unable to transfer pt to chair due to pt leaving to go to a test. Instructed staff to use the stedy to assist pt up in the chair later today. Cont to recommend CIR upon d/c for maximal functional recovery.    Follow Up Recommendations  CIR;Supervision/Assistance - 24 hour     Equipment Recommendations  Other (comment)    Recommendations for Other Services OT consult;Rehab consult     Precautions / Restrictions Precautions Precautions: Fall Precaution Comments: R sided weakness Restrictions Weight Bearing Restrictions: No    Mobility  Bed Mobility Overal bed mobility: Needs Assistance Bed Mobility: Rolling;Sidelying to Sit(to the left) Rolling: Min assist;Mod assist Sidelying to sit: Min assist;Mod assist       General bed mobility comments: max directinal verbal cues, modA for R UE and LE management, modA for trunk elevatino due to limited use of R UE functinally  Transfers Overall transfer level: Needs assistance Equipment used: 2 person hand held assist(transitioned to RW) Transfers: Sit to/from Stand Sit to Stand: Mod assist         General transfer comment: pt with increased initiation today, no R knee buckling  Ambulation/Gait             General Gait Details: pt completed side steps to HOB x 6 with max directional verbal cues and tactile cues for weight shifting. modA to clear R off ground,  unable to advance amb because pt leaving for a test   Stairs             Wheelchair Mobility    Modified Rankin (Stroke Patients Only) Modified Rankin (Stroke Patients Only) Pre-Morbid Rankin Score: No symptoms Modified Rankin: Moderately severe disability     Balance Overall balance assessment: Needs assistance Sitting-balance support: No upper extremity supported;Feet supported Sitting balance-Leahy Scale: Poor Sitting balance - Comments: pt able to sit EOB x 5 min without physcial assist   Standing balance support: Bilateral upper extremity supported Standing balance-Leahy Scale: Poor Standing balance comment: unable to maintain unless physical assist provided                            Cognition Arousal/Alertness: Awake/alert Behavior During Therapy: Flat affect Overall Cognitive Status: Impaired/Different from baseline Area of Impairment: Awareness;Problem solving;Following commands;Safety/judgement                       Following Commands: Follows one step commands with increased time;Follows multi-step commands inconsistently   Awareness: Emergent Problem Solving: Slow processing;Decreased initiation;Difficulty sequencing;Requires verbal cues;Requires tactile cues General Comments: soft spoken, delayed in response time both physically and verbally      Exercises Other Exercises Other Exercises: completed 2 session of standing in walker and 'marching in place" max directional verbal cues to sequence and tactile cues to weight shifting, modA to lift R LE, no buckling    General Comments General  comments (skin integrity, edema, etc.): VSS      Pertinent Vitals/Pain Pain Assessment: No/denies pain Faces Pain Scale: No hurt    Home Living     Available Help at Discharge: Family;Available 24 hours/day Type of Home: House              Prior Function            PT Goals (current goals can now be found in the care plan  section) Progress towards PT goals: Progressing toward goals    Frequency    Min 4X/week      PT Plan Current plan remains appropriate    Co-evaluation PT/OT/SLP Co-Evaluation/Treatment: Yes Reason for Co-Treatment: Complexity of the patient's impairments (multi-system involvement);For patient/therapist safety PT goals addressed during session: Mobility/safety with mobility        AM-PAC PT "6 Clicks" Mobility   Outcome Measure  Help needed turning from your back to your side while in a flat bed without using bedrails?: A Lot Help needed moving from lying on your back to sitting on the side of a flat bed without using bedrails?: A Lot Help needed moving to and from a bed to a chair (including a wheelchair)?: A Lot Help needed standing up from a chair using your arms (e.g., wheelchair or bedside chair)?: A Lot Help needed to walk in hospital room?: A Lot Help needed climbing 3-5 steps with a railing? : Total 6 Click Score: 11    End of Session Equipment Utilized During Treatment: Gait belt Activity Tolerance: Patient tolerated treatment well Patient left: in bed;with call bell/phone within reach;with family/visitor present;with nursing/sitter in room Nurse Communication: Mobility status;Precautions PT Visit Diagnosis: Other abnormalities of gait and mobility (R26.89);Muscle weakness (generalized) (M62.81);Other symptoms and signs involving the nervous system (R29.898)     Time: 3893-7342 PT Time Calculation (min) (ACUTE ONLY): 33 min  Charges:  $Gait Training: 8-22 mins                     Lewis Shock, PT, DPT Acute Rehabilitation Services Pager #: [phone] Office #: [phone]    Iona Hansen 04/20/2018, 10:59 AM

## 2018-04-20 NOTE — Evaluation (Signed)
Speech Language Pathology Evaluation Patient Details Name: Kristin Solis MRN: [id number] DOB: Jan 14, 1956 Today's Date: 04/20/2018 Time: 2119-4174 SLP Time Calculation (min) (ACUTE ONLY): 35 min  Problem List:  Patient Active Problem List   Diagnosis Date Noted  . Middle cerebral artery embolism, left 04/18/2018  . Stroke (cerebrum) (HCC) 04/17/2018  . IFG (impaired fasting glucose) 03/28/2018  . Essential hypertension, benign 01/15/2017  . Asthma 01/15/2017   Past Medical History:  Past Medical History:  Diagnosis Date  . Asthma   . Hypertension    Past Surgical History:  Past Surgical History:  Procedure Laterality Date  . ABDOMINAL HYSTERECTOMY  2005   total  . IR CT HEAD LTD  04/18/2018  . IR INTRAVSC STENT CERV CAROTID W/O EMB-PROT MOD SED INC ANGIO  04/18/2018  . IR PERCUTANEOUS ART THROMBECTOMY/INFUSION INTRACRANIAL INC DIAG ANGIO  04/18/2018  . OOPHORECTOMY    . RADIOLOGY WITH ANESTHESIA N/A 04/17/2018   Procedure: RADIOLOGY WITH ANESTHESIA;  Surgeon: Julieanne Cotton, MD;  Location: [facility] OR;  Service: Radiology;  Laterality: N/A;   HPI:  Pt presented to the ED following acute onset of dysphasia and right hemiparesis. However, on arrival her speech was fluent and she was able to relate to ED staff that she had been trying to tell her husband that a friend of hers daughter had committed suicide last night but she couldn't "get the words out." She further stated that she couldn't find her keys and couldn't "make a sentence" and her husband reported that she was also "slobbering all over herself" at that time. Code stroke was cancelled but subsequently reactivated when she again demonstrated difficulty speaking and dense right hemiparesis. CT head showed mild asymmetric hypoattenuation within the left lateral frontal lobe and anterior insula in comparison with the right which was felt possibly to represent acute stroke. Also noted was a small density within left Sylvian fissure,  possible MCA branch thrombus. IV tPA was started and CTA of the neck showed: Left proximal ICA thromboembolism. The ICA downstream to the thrombosis is opacified with contrast suggesting that the thrombus is near occlusive, but not totally occlusive. She underwent  lt common carotid arteriogram followed by complete endovascular revascularization by IR and was intubated by anesthesia at 22:38 on 04/17/2018 with extubation on 04/19/18 at 0910.    Assessment / Plan / Recommendation Clinical Impression  Pt was seen for speech/language evaluation with her husband present. Both parties denied the pt having any deficits in speech/language/cognition prior to admission but the pt reported having some difficulty with word retrieval and her husband expressed that her speech is now slurred. Pt presented with word retrieval difficulty which necessitated increased processing time at the sentence level and difficulty was noted with auditory comprehension at the paragraph level as well as during completion of three-step commands. Pt demonstrated moderate dysarthria characterized by reduced articulatory precision and an impaired ability to adequately coordinate respiration and speech which resulted in a reduced vocal intensity and negatively impacted speech intelligibility. Skilled SLP services are clinically indicated to improve speech and language function.     SLP Assessment  SLP Recommendation/Assessment: Patient needs continued Speech Lanaguage Pathology Services SLP Visit Diagnosis: Aphasia (R47.01);Dysarthria and anarthria (R47.1)    Follow Up Recommendations  Inpatient Rehab    Frequency and Duration min 2x/week  2 weeks      SLP Evaluation Cognition  Overall Cognitive Status: Difficult to assess Orientation Level: Oriented X4 Attention: Focused;Sustained Focused Attention: Appears intact Sustained Attention: Appears intact Memory: (Immediate:  3/3; Delayed: 2/3 3/3 with cues) Awareness:  Impaired Awareness Impairment: Intellectual impairment Problem Solving: Appears intact       Comprehension  Auditory Comprehension Overall Auditory Comprehension: Impaired Yes/No Questions: Impaired Other Yes/No Questions Comments`: (SImple: 5/5; Complex: 4/5) Commands: Impaired One Step Basic Commands: (4/4) Two Step Basic Commands: (4/4) Multistep Basic Commands: (0/4) Conversation: Simple Reading Comprehension Reading Status: Not tested    Expression Expression Primary Mode of Expression: Verbal Verbal Expression Overall Verbal Expression: Impaired Initiation: Impaired Automatic Speech: Counting;Day of week;Month of year(WFL) Level of Generative/Spontaneous Verbalization: Sentence Repetition: Impaired Level of Impairment: Sentence level(3/5) Naming: Impairment Responsive: (3/5) Confrontation: Within functional limits(10/10) Convergent: (Sentence completion: 5/5) Divergent: (7 items per category in one minute)   Oral / Motor  Oral Motor/Sensory Function Overall Oral Motor/Sensory Function: Moderate impairment Facial ROM: Reduced right;Suspected CN VII (facial) dysfunction Facial Symmetry: Within Functional Limits Facial Strength: Reduced right;Suspected CN VII (facial) dysfunction Lingual ROM: Reduced right;Suspected CN XII (hypoglossal) dysfunction Lingual Symmetry: Within Functional Limits Lingual Strength: Reduced;Suspected CN XII (hypoglossal) dysfunction Velum: Within Functional Limits Mandible: Within Functional Limits Motor Speech Respiration: Impaired Level of Impairment: Phrase Phonation: Low vocal intensity Resonance: Within functional limits Articulation: Impaired Level of Impairment: Sentence Intelligibility: Intelligibility reduced Word: 75-100% accurate Phrase: 75-100% accurate Sentence: 50-74% accurate Conversation: 50-74% accurate Motor Planning: Witnin functional limits Motor Speech Errors: Aware Effective Techniques: Slow  rate;Over-articulate;Increased vocal intensity   Zamariya Neal I. Vear Clock, MS, CCC-SLP Acute Rehabilitation Services Office number [phone] Pager [phone]                   Scheryl Marten 04/20/2018, 9:32 AM

## 2018-04-20 NOTE — Evaluation (Addendum)
Occupational Therapy Evaluation Patient Details Name: Kristin Solis MRN: [id number] DOB: 18-Jan-1956 Today's Date: 04/20/2018    History of Present Illness 63 yo admitted with dysphagia and right weakness from [facility] s/p tPA with Left ACA/ICA/MCA occlusion s/p thrombectomy 3/1. pt extubated 3/3. PMhx: HTN, asthma   Clinical Impression   PTA, pt was living with her husband and was independent and working as a Engineer, structural. Pt currently requiring Mod A for UB ADLs, Max A for LB ADLs, and Mod A +2 for functional transfers. Pt is highly motivated to participate in therapy and has good family support. Pt presenting with decreased use of RUE (dominant hand), balance, cognition, and strength. Pt will require further acute OT to facilitate safe dc. Recommend dc to CIR for intensive OT to optimize safety, independence with ADLs, and return to PLOF.      Follow Up Recommendations  CIR;Supervision/Assistance - 24 hour    Equipment Recommendations  Other (comment)(Defer to next venue)    Recommendations for Other Services Rehab consult;PT consult;Speech consult     Precautions / Restrictions Precautions Precautions: Fall Precaution Comments: R sided weakness Restrictions Weight Bearing Restrictions: No      Mobility Bed Mobility Overal bed mobility: Needs Assistance Bed Mobility: Rolling;Sidelying to Sit;Sit to Sidelying(to the left) Rolling: Min assist;Mod assist Sidelying to sit: Min assist;Mod assist     Sit to sidelying: Mod assist;+2 for physical assistance;Max assist General bed mobility comments: max directinal verbal cues, modA for R UE and LE management, modA for trunk elevatino due to limited use of R UE functinally. Requiring Max A for managing BLEs over EOB in returning to supine  Transfers Overall transfer level: Needs assistance Equipment used: 2 person hand held assist(transitioned to RW) Transfers: Sit to/from Stand Sit to Stand: Mod assist         General transfer  comment: pt with increased initiation today, no R knee buckling    Balance Overall balance assessment: Needs assistance Sitting-balance support: No upper extremity supported;Feet supported Sitting balance-Leahy Scale: Poor Sitting balance - Comments: pt able to sit EOB x 5 min without physcial assist   Standing balance support: Bilateral upper extremity supported Standing balance-Leahy Scale: Poor Standing balance comment: unable to maintain unless physical assist provided                           ADL either performed or assessed with clinical judgement   ADL Overall ADL's : Needs assistance/impaired Eating/Feeding: NPO   Grooming: Minimal assistance;Oral care;Bed level Grooming Details (indicate cue type and reason): Pt performing oral care at bedlevel with RN upon arrival. Pt perform all of task with left hand and required assistance for bilateral tasks Upper Body Bathing: Moderate assistance;Sitting   Lower Body Bathing: Maximal assistance;Sit to/from stand;+2 for physical assistance;+2 for safety/equipment   Upper Body Dressing : Moderate assistance;Sitting   Lower Body Dressing: Maximal assistance;+2 for physical assistance;+2 for safety/equipment;Sit to/from stand Lower Body Dressing Details (indicate cue type and reason): Sitting at EOB, pt bringing ankles up with Max A. Pt then requiring hand over hand to manage RUE to don socks. Pt highly motivated             Functional mobility during ADLs: Moderate assistance;+2 for safety/equipment;Rolling walker(side-steps towards HOB) General ADL Comments: Pt presenting with decreased strength, functional use of RUE, balance, and cognition.      Vision Baseline Vision/History: Wears glasses Wears Glasses: At all times Patient Visual Report: Other (comment)(Denies  changes) Vision Assessment?: Yes Eye Alignment: Within Functional Limits Ocular Range of Motion: Within Functional Limits Alignment/Gaze Preference:  Within Defined Limits Tracking/Visual Pursuits: Decreased smoothness of horizontal tracking;Decreased smoothness of vertical tracking;Requires cues, head turns, or add eye shifts to track;Unable to hold eye position out of midline Convergence: Impaired (comment) Additional Comments: Pt with decreased smooth tracking throughout all visual field. Pt requiring cues to follow finger during tracking. visual fatigue. Poor convergence     Perception     Praxis      Pertinent Vitals/Pain Pain Assessment: No/denies pain     Hand Dominance Right   Extremity/Trunk Assessment Upper Extremity Assessment Upper Extremity Assessment: RUE deficits/detail RUE Deficits / Details: Decreased ROM and strength. Able to perform partial flexion and extenion of elbow, wrist, and hand. Unable to bring hand to chest. Noting increased edema. Able to perform finger opposition very slowly.  RUE Coordination: decreased fine motor;decreased gross motor   Lower Extremity Assessment Lower Extremity Assessment: Defer to PT evaluation   Cervical / Trunk Assessment Cervical / Trunk Assessment: Kyphotic   Communication Communication Communication: Expressive difficulties   Cognition Arousal/Alertness: Awake/alert Behavior During Therapy: Flat affect Overall Cognitive Status: Impaired/Different from baseline Area of Impairment: Awareness;Problem solving;Following commands;Safety/judgement                       Following Commands: Follows one step commands with increased time;Follows multi-step commands inconsistently Safety/Judgement: Decreased awareness of safety Awareness: Emergent Problem Solving: Slow processing;Decreased initiation;Difficulty sequencing;Requires verbal cues;Requires tactile cues General Comments: soft spoken, delayed in response time both physically and verbally. requiring increased cues for problem solving.    General Comments  VSS throughout. Husband present    Exercises  Exercises: Other exercises Other Exercises Other Exercises: Educating pt on edema management and ROM for RUE.    Shoulder Instructions      Home Living Family/patient expects to be discharged to:: Private residence Living Arrangements: Spouse/significant other;Children Available Help at Discharge: Family;Available 24 hours/day Type of Home: House Home Access: Stairs to enter [employer] of Steps: 2 Entrance Stairs-Rails: None Home Layout: Two level;Able to live on main level with bedroom/bathroom     Bathroom Shower/Tub: Chief Strategy Officer: Standard     Home Equipment: None      Lives With: Spouse    Prior Functioning/Environment Level of Independence: Independent                 OT Problem List: Decreased strength;Decreased range of motion;Decreased activity tolerance;Impaired balance (sitting and/or standing);Decreased safety awareness;Decreased knowledge of use of DME or AE;Decreased knowledge of precautions;Decreased cognition;Impaired vision/perception;Decreased coordination;Impaired UE functional use;Increased edema      OT Treatment/Interventions: Self-care/ADL training;Therapeutic exercise;Energy conservation;DME and/or AE instruction;Therapeutic activities;Patient/family education    OT Goals(Current goals can be found in the care plan section) Acute Rehab OT Goals Patient Stated Goal: return to work, hanging out at home OT Goal Formulation: With patient/family Time For Goal Achievement: 05/04/18 Potential to Achieve Goals: Good  OT Frequency: Min 3X/week   Barriers to D/C:            Co-evaluation   Reason for Co-Treatment: Complexity of the patient's impairments (multi-system involvement);For patient/therapist safety PT goals addressed during session: Mobility/safety with mobility        AM-PAC OT "6 Clicks" Daily Activity     Outcome Measure Help from another person eating meals?: A Little Help from another person  taking care of personal grooming?: A Little Help from  another person toileting, which includes using toliet, bedpan, or urinal?: A Lot Help from another person bathing (including washing, rinsing, drying)?: A Lot Help from another person to put on and taking off regular upper body clothing?: A Lot Help from another person to put on and taking off regular lower body clothing?: A Lot 6 Click Score: 14   End of Session Equipment Utilized During Treatment: Gait belt Nurse Communication: Mobility status  Activity Tolerance: Patient tolerated treatment well Patient left: in bed;with call bell/phone within reach;with bed alarm set;with family/visitor present;with SCD's reapplied  OT Visit Diagnosis: Unsteadiness on feet (R26.81);Other abnormalities of gait and mobility (R26.89);Muscle weakness (generalized) (M62.81);Other symptoms and signs involving cognitive function;Hemiplegia and hemiparesis Hemiplegia - Right/Left: Right Hemiplegia - dominant/non-dominant: Dominant Hemiplegia - caused by: Cerebral infarction                Time: 1010-1042 OT Time Calculation (min): 32 min Charges:  OT General Charges $OT Visit: 1 Visit OT Evaluation $OT Eval Moderate Complexity: 1 Mod  Shantea Poulton MSOT, OTR/L Acute Rehab Pager: [phone] Office: [phone]  Theodoro GristCharis M Niyah Mamaril 04/20/2018, 1:56 PM

## 2018-04-20 NOTE — Progress Notes (Signed)
Modified Barium Swallow Progress Note  Patient Details  Name: Kristin Solis MRN: [id number] Date of Birth: January 20, 1956  Today's Date: 04/20/2018  Modified Barium Swallow completed.  Full report located under Chart Review in the Imaging Section.  Brief recommendations include the following:  Clinical Impression  Pt was adequately alert for the study and NG tube was removed during the study by Marchelle Folks, RN due to concerns regarding its impact of swallow function. Pt presents with moderate oropharyngeal dysphagia. She demonstrated premature spillage secondary to impaired bolus control and base of tongue residue. Pharyngeally she exhibited vallecular residue secondary to reduced lingual retraction, and reduced anterior laryngeal movement which resulted in pyriform sinus residue as well as penetration (PAS 3 and 5) of liquids during deglutition. Penetration consistently resulted in throat clearing and/or coughing. Throat clearing and coughing were effective in expelling the penetrant but subsequent penetration was noted following inhilation. A dysphagia 1 diet is recommended at this time. With SLP intervention and further recovery, pt may be able to tolerate nectar thick liquids and therapeutic trials of liquids will be administered by SLP; however, p.o. liquids are not otherwise recommended at this time and IV fluids will therefore be beneficial for hydration.    Swallow Evaluation Recommendations       SLP Diet Recommendations: Dysphagia 1 (Puree) solids;Pudding thick liquid;Other (Comment)   Liquid Administration via: Spoon;Other (Comment)(IV fluids)   Medication Administration: Crushed with puree   Supervision: Staff to assist with self feeding   Compensations: Slow rate;Small sips/bites   Postural Changes: Remain semi-upright after after feeds/meals (Comment);Seated upright at 90 degrees   Oral Care Recommendations: Oral care BID       Parnell Spieler I. Vear Clock, MS, CCC-SLP Acute  Rehabilitation Services Office number [phone] Pager [phone]  Scheryl Marten 04/20/2018,2:01 PM

## 2018-04-20 NOTE — Progress Notes (Signed)
Nutrition Follow-up  DOCUMENTATION CODES:   Obesity unspecified  INTERVENTION:    Magic cup TID with meals, each supplement provides 290 kcal and 9 grams of protein  Monitor for further diet advancement per SLP  NUTRITION DIAGNOSIS:   Inadequate oral intake related to inability to eat as evidenced by NPO status.  Intake progressing- diet advanced   GOAL:   Patient will meet greater than or equal to 90% of their needs  Progressing  MONITOR:   Diet advancement, Vent status, Weight trends, Labs, I & O's, Skin, TF tolerance  REASON FOR ASSESSMENT:   Ventilator    ASSESSMENT:   Patient with PMH significant for HTN and asthma. Presents this admission with dysphagia and right hemiparesis. Found to have acute large left MCA , s/p tPA.    3/2- revascularization of L MCA, stent L ICA 3/3- extubated  MBS resulted this afternoon, pt's diet advanced to DYS 1 with pudding thick liquids. Husband feeding pt during RD follow up. She enjoys Biochemist, clinical and pureed fruit. Discussed the importance of protein intake for preservation of lean body mass. Pt willing to continue [employer]. Will monitor for progression of diet and provide additional supplements as needed.   A recent wt has not been obtained since 3/1. Recommend obtaining recent weight.   I/O: +2298 ml since admit UOP: 900 ml x 24 hrs   Medications reviewed and include: NS @ 50 ml/hr, potassium phosphate in D5 Labs reviewed: K 3.4 (L) Phosphorus 2.2 (L)   Diet Order:   Diet Order            DIET - DYS 1 Room service appropriate? Yes with Assist; Fluid consistency: Pudding Thick; Fluid restriction: Other (see comments)  Diet effective now              EDUCATION NEEDS:   Not appropriate for education at this time  Skin:  Skin Assessment: Skin Integrity Issues: Skin Integrity Issues:: Incisions Incisions: right groin   Last BM:  PTA  Height:   Ht Readings from Last 1 Encounters:  04/17/18 5\' 3"  (1.6 m)     Weight:   Wt Readings from Last 1 Encounters:  04/17/18 112 kg    Ideal Body Weight:  52.3 kg  BMI:  There is no height or weight on file to calculate BMI.  Estimated Nutritional Needs:   Kcal:  1600-1800 kcal  Protein:  80-95 grams  Fluid:  >/= 1.6 L/day   Vanessa Kick RD, LDN Clinical Nutrition Pager # - [phone]

## 2018-04-20 NOTE — Progress Notes (Signed)
..   NAME:  Kristin Solis, MRN:  [id number], DOB:  01-25-1956, LOS: 3 ADMISSION DATE:  04/17/2018, CONSULTATION DATE:  04/18/2018 REFERRING MD:  Otelia Limes MD, CHIEF COMPLAINT:  acute onset of dysphasia and right hemiparesis  Brief History   63 yo female w/ PMHx asthma and HTN presented with dysphasia and right hemiparesis last seen normal at 7:30 PM/ Found to have an acute large ischemic left MCA stroke s/p TPA s/p revascularization s/p stent assisted angioplasty of severely stenotic left ICA. Remains intubated post procedure. PCCM consulted for vent mgmt  History of present illness   63 yr old female with PMHx significant for Asthma and HTN was at home when she developed acute dysphasia and right hemiparesis.  She was trying to relate a traumatic incident to her husband when she began having trouble with word finding and her husband noticed she was drooling. On presentation to EDP she was initially normal than had a recurrence of dysphasia with right hemiparesis. CTH showed acute ischemic CVA and pt received TPA at 20:32  Pt had imaging completed and was evaluated by IR for revascularization. Was intubated by Anesthesia at 22:38 on 04/17/2018 with a 7.5 ETT Pt received a lt common carotid arteriogram followed bycomplete  endovascular revascularization of occluded Lt MCA,Lt ACA aand Lt ICA terminus with x 1 pass with 5 mm x 33 mm embotrap retriver device achieving a TICI 3 revascularization. S/P stent assisted angioplasty of  severely stenotic Lt ICA at the bulb . Patient loaded with 180 m8 of brilinta and 81 mg of aspirin . Also received 3 mg of IA integrelin Transferred to Neuro ICU intubated and PCCM consulted for vent mgmt Extubated 04/19/2018 Past Medical History  .[name] Active Ambulatory Problems    Diagnosis Date Noted  . Essential hypertension, benign 01/15/2017  . Asthma 01/15/2017  . IFG (impaired fasting glucose) 03/28/2018   Resolved Ambulatory Problems    Diagnosis Date Noted  .  Encounter to establish care 01/15/2017   Past Medical History:  Diagnosis Date  . Hypertension     Consults:  Interventional: 04/17/2018 Anesthesia: 04/17/2018 PCCM 04/18/2018  Procedures:  Endotracheally intubated on 04/17/2018 extubated 04/19/2018 Cerebral Angiogram- 04/18/2018 Revascularization of occluded Left MCA, ACA and ICA and stenting of left ICA- 04/18/2018  Significant Diagnostic Tests:  CT brain perfusion: Left MCA distribution large ischemic penumbra, 77 cc. Left frontal white matter and insula core infarct of 11 cc. ASPECTS 8 infarct on noncontrast CT of head with possible pseudonormalization of perfusion.  CTA head: 1. Left M1 occlusion. Poor left MCA distribution collateralization. 2. No additional intracranial large vessel occlusion, high-grade stenosis, aneurysm, or vascular malformation.  CTA neck: 1. Left proximal ICA thromboembolism. The ICA downstream to the thrombosis is opacified with contrast suggesting that the thrombus is near occlusive, but not totally occlusive. 2. Patent right carotid system and bilateral vertebral arteries without hemodynamically significant stenosis by NASCET criteria, dissection, or aneurysm.  Micro Data:  none  Antimicrobials:  Ancef - 04/17/2018    Objective   Blood pressure (!) 114/57, pulse 65, temperature 99.6 F (37.6 C), temperature source Axillary, resp. rate (!) 31, SpO2 100 %.        Intake/Output Summary (Last 24 hours) at 04/20/2018 0917 Last data filed at 04/19/2018 2000 Gross per 24 hour  Intake 674.88 ml  Output 900 ml  Net -225.12 ml   There were no vitals filed for this visit.  Examination: General: Obese female no acute distress HEENT: Bilateral ear examinations revealed  no overt signs of infection utilizing otoscope Neuro: Speech is clear, right arm movement remains about the same still with right hemiparesis CV: Heart sounds are regular PULM: even/non-labored, lungs bilaterally rhonchi, diminished  breath sounds at bases good cough VU:YEBX, non-tender, bsx4 active  Extremities: warm/dry, 2+ edema  Skin: no rashes or lesions    Assessment & Plan:  1. Acute Encephalopathy : dysphasia and right sided hemiparesis secondary to large left ischemic MCA stroke s/p revascularization Plan: Per neurology  2. Acute respiratory failure secondary to neurologic injury Protection of airway Endotracheally intubated Plan: Extubated 04/19/2018 04/20/2018 remains extubated no acute distress Pulmonary critical care will sign off as of 04/20/2018   Hypokalemia Recent Labs  Lab 04/18/18 0454 04/19/18 0500 04/20/18 0615  K 3.3* 3.1* 3.4*   Replete electrolytes as needed Monitor electrolytes  Best practice:  Diet: NPO secondary having failed swallowing evaluation Pain/Anxiety/Delirium protocol none indicated VAP protocol (if indicated): No DVT prophylaxis: per primary  GI prophylaxis: PPI Glucose control: ISS if BG exceeds 180mg /dl Mobility: Advance per primary Code Status: Full 04/20/2018 patient and husband updated at bedside Disposition: ICU admitted under Neurology with PCCM will sign off as of 04/20/2018  Labs   CBC: Recent Labs  Lab 04/17/18 1918 04/18/18 0454 04/19/18 0500 04/20/18 0615  WBC 9.9 12.9* 9.4 11.5*  NEUTROABS 6.3 12.0*  --  9.8*  HGB 14.1 13.1 10.6* 11.6*  HCT 43.9 40.6 33.4* 36.0  MCV 89.4 88.3 90.8 90.0  PLT 331 327 252 231    Basic Metabolic Panel: Recent Labs  Lab 04/17/18 1918 04/18/18 0454 04/19/18 0500 04/20/18 0615  NA 139 136 140 141  K 3.5 3.3* 3.1* 3.4*  CL 101 106 112* 110  CO2 27 23 22 23   GLUCOSE 163* 162* 115* 93  BUN 22 16 11 14   CREATININE 1.04* 0.89 0.86 0.96  CALCIUM 8.9 8.3* 8.1* 8.5*  MG  --   --   --  1.9  PHOS  --   --   --  2.2*   GFR: Estimated Creatinine Clearance: 74 mL/min (by C-G formula based on SCr of 0.96 mg/dL). Recent Labs  Lab 04/17/18 1918 04/18/18 0454 04/19/18 0500 04/20/18 0615  WBC 9.9 12.9* 9.4  11.5*    Liver Function Tests: Recent Labs  Lab 04/17/18 1918  AST 19  ALT 22  ALKPHOS 77  BILITOT 0.6  PROT 6.7  ALBUMIN 3.7   No results for input(s): LIPASE, AMYLASE in the last 168 hours. No results for input(s): AMMONIA in the last 168 hours.  ABG    Component Value Date/Time   O2SAT 41.8 04/17/2018 1944     Coagulation Profile: Recent Labs  Lab 04/17/18 1918  INR 1.0    Cardiac Enzymes: No results for input(s): CKTOTAL, CKMB, CKMBINDEX, TROPONINI in the last 168 hours.  HbA1C: Hgb A1c MFr Bld  Date/Time Value Ref Range Status  04/18/2018 04:54 AM 5.7 (H) 4.8 - 5.6 % Final    Comment:    (NOTE) Pre diabetes:          5.7%-6.4% Diabetes:              >6.4% Glycemic control for   <7.0% adults with diabetes   03/30/2018 11:00 AM 5.7 (H) 4.8 - 5.6 % Final    Comment:             Prediabetes: 5.7 - 6.4          Diabetes: >6.4  Glycemic control for adults with diabetes: <7.0     CBG: Recent Labs  Lab 04/17/18 2009  GLUCAP 122*      App Critical care time: 30 mins    Brett Canales Reis Pienta ACNP Adolph Pollack PCCM Pager [phone] till 1 pm If no answer page [phone] 04/20/2018, 9:17 AM

## 2018-04-20 NOTE — Progress Notes (Signed)
Referring Physician(s): CODE STROKE- Caryl Pina  Supervising Physician: Julieanne Cotton  Patient Status:  [facility] - In-pt  Chief Complaint: None  Subjective:  Left ICA, left MCA, and left ACA occlusions s/p emergent mechanical thrombectomy achieving a TICI 3 revascularization 04/17/2018 by Dr. Corliss Skains. Left ICA stenosis s/p revascularization using stent assisted angioplasty 04/17/2018 by Dr. Corliss Skains. Patient awake and alert laying in bed with no complaints at this time. Accompanied by husband at bedside. Can spontaneously move all extremities with some weakness of RUE (can lift off bed against gravity but not fully extend). Right groin incision c/d/i.   Allergies: Patient has no known allergies.  Medications: Prior to Admission medications   Medication Sig Start Date End Date Taking? Authorizing Provider  albuterol (PROVENTIL HFA;VENTOLIN HFA) 108 (90 Base) MCG/ACT inhaler Inhale 2 puffs into the lungs every 6 (six) hours as needed for wheezing or shortness of breath. 03/30/18  Yes Particia Nearing, PA-C  fluticasone [facility]) 50 MCG/ACT nasal spray Place 2 sprays into both nostrils daily. 03/30/18  Yes Particia Nearing, PA-C  Fluticasone-Salmeterol (ADVAIR DISKUS) 250-50 MCG/DOSE AEPB Inhale 1 puff into the lungs 2 (two) times daily. 03/30/18  Yes Particia Nearing, PA-C  lisinopril-hydrochlorothiazide (PRINZIDE,ZESTORETIC) 10-12.5 MG tablet Take 1 tablet by mouth daily. 03/30/18  Yes Particia Nearing, PA-C  predniSONE (DELTASONE) 50 MG tablet Take 1 tablet (50 mg total) by mouth daily with breakfast. 04/11/18  Yes Johnson, Megan P, DO  ibuprofen (ADVIL,MOTRIN) 800 MG tablet Take 1 tablet (800 mg total) by mouth every 8 (eight) hours as needed. Patient not taking: Reported on 04/18/2018 10/22/16   Little, Traci M, PA-C  montelukast (SINGULAIR) 10 MG tablet Take 1 tablet (10 mg total) by mouth at bedtime. Patient not taking: Reported on 04/18/2018 03/30/18   Particia Nearing, PA-C     Vital Signs: BP (!) 114/57 (BP Location: Left Arm)   Pulse 65   Temp 99.6 F (37.6 C) (Axillary)   Resp (!) 31   SpO2 100%   Physical Exam Vitals signs and nursing note reviewed.  Constitutional:      General: She is not in acute distress.    Appearance: Normal appearance.  Pulmonary:     Effort: Pulmonary effort is normal. No respiratory distress.  Skin:    General: Skin is warm and dry.     Comments: Right groin incision soft without active bleeding or hematoma.  Neurological:     Mental Status: She is alert.     Comments: Alert, awake, and oriented x3. Speech and comprehension intact. PERRL bilaterally. EOMs intact bilaterally without nystagmus or subjective diplopia. No facial asymmetry. Tongue midline. Can spontaneously move all extremities with some weakness of RUE (can lift off bed against gravity but not fully extend). EOMs, visual fields, pronator drift, fine motor and coordination, gait, romberg, and heel to toe not assessed. Distal pulses 1+ bilaterally.   Psychiatric:        Mood and Affect: Mood normal.        Behavior: Behavior normal.        Thought Content: Thought content normal.        Judgment: Judgment normal.     Imaging: Ct Angio Head W Or Wo Contrast  Result Date: 04/17/2018 EXAM: CT ANGIOGRAPHY HEAD AND NECK CT PERFUSION BRAIN TECHNIQUE: Multidetector CT imaging of the head and neck was performed using the standard protocol during bolus administration of intravenous contrast. Multiplanar CT image reconstructions and MIPs were obtained to  evaluate the vascular anatomy. Carotid stenosis measurements (when applicable) are obtained utilizing NASCET criteria, using the distal internal carotid diameter as the denominator. Multiphase CT imaging of the brain was performed following IV bolus contrast injection. Subsequent parametric perfusion maps were calculated using RAPID software. CONTRAST:  ISOVUE-370 IOPAMIDOL  (ISOVUE-370) INJECTION 76% COMPARISON:  None. FINDINGS: CTA NECK FINDINGS Aortic arch: Standard branching. Imaged portion shows no evidence of aneurysm or dissection. No significant stenosis of the major arch vessel origins. Right carotid system: No evidence of dissection, stenosis (50% or greater) or occlusion. Non stenotic calcific atherosclerosis of the carotid bifurcation. Left carotid system: Left proximal ICA thromboembolism. The ICA downstream to the thrombosis is opacified with contrast suggesting that the thrombus is near occlusive but not totally occlusive. Vertebral arteries: Left dominant. No evidence of dissection, stenosis (50% or greater) or occlusion. Skeleton: No acute finding. Other neck: 7 mm nodule within the left lobe of the thyroid gland. Upper chest: Negative. Review of the MIP images confirms the above findings CTA HEAD FINDINGS Anterior circulation: Non stenotic calcific atherosclerosis of the carotid siphons. Large right A1, large anterior communicating artery, diminutive left A1, normal variant. Patent bilateral ACA and right MCA distributions. Left M1 occlusion with poor left MCA distribution collateralization. Posterior circulation: No significant stenosis, proximal occlusion, aneurysm, or vascular malformation. Venous sinuses: As permitted by contrast timing, patent. Anatomic variants: None significant. Review of the MIP images confirms the above findings CT Brain Perfusion Findings: CBF (<30%) Volume: 11mL Perfusion (Tmax>6.0s) volume: 77mL Mismatch Volume: 66mL Infarction Location:Left MCA distribution, ASPECTS 8 stroke on noncontrast CT. IMPRESSION: CT brain perfusion: Left MCA distribution large ischemic penumbra, 77 cc. Left frontal white matter and insula core infarct of 11 cc. ASPECTS 8 infarct on noncontrast CT of head with possible pseudonormalization of perfusion. CTA head: 1. Left M1 occlusion.  Poor left MCA distribution collateralization. 2. No additional intracranial large  vessel occlusion, high-grade stenosis, aneurysm, or vascular malformation. CTA neck: 1. Left proximal ICA thromboembolism. The ICA downstream to the thrombosis is opacified with contrast suggesting that the thrombus is near occlusive, but not totally occlusive. 2. Patent right carotid system and bilateral vertebral arteries without hemodynamically significant stenosis by NASCET criteria, dissection, or aneurysm. These results were called by telephone at the time of interpretation on 04/17/2018 at 9:24 pm to Dr. Daryel November , who verbally acknowledged these results. Electronically Signed   By: Mitzi Hansen M.D.   On: 04/17/2018 21:35   Dg Chest 1 View  Result Date: 04/18/2018 CLINICAL DATA:  Post intubation EXAM: CHEST  1 VIEW COMPARISON:  04/17/2018 FINDINGS: Interval intubation, tip of the endotracheal tube is just above the carina. Esophageal tube tip overlies the gastric fundus and projects near GE junction. Streaky atelectasis at the left base. Borderline cardiomegaly. No pneumothorax. Contrast material within the renal collecting systems. IMPRESSION: 1. Endotracheal tube tip just above the carina. Esophageal tube tip projects over the gastric fundal region and is directed cephalad near the GE junction 2. Streaky left perihilar and basilar atelectasis. Borderline cardiomegaly Electronically Signed   By: Jasmine Pang M.D.   On: 04/18/2018 03:56   Dg Chest 1 View  Result Date: 04/17/2018 CLINICAL DATA:  Right-sided weakness.  Asthma and hypertension EXAM: CHEST  1 VIEW COMPARISON:  None. FINDINGS: The heart size and mediastinal contours are within normal limits. Both lungs are clear. The visualized skeletal structures are unremarkable. IMPRESSION: No active disease. Electronically Signed   By: Signa Kell M.D.   On:  04/17/2018 21:58   Ct Angio Neck W Or Wo Contrast  Result Date: 04/17/2018 EXAM: CT ANGIOGRAPHY HEAD AND NECK CT PERFUSION BRAIN TECHNIQUE: Multidetector CT imaging of the  head and neck was performed using the standard protocol during bolus administration of intravenous contrast. Multiplanar CT image reconstructions and MIPs were obtained to evaluate the vascular anatomy. Carotid stenosis measurements (when applicable) are obtained utilizing NASCET criteria, using the distal internal carotid diameter as the denominator. Multiphase CT imaging of the brain was performed following IV bolus contrast injection. Subsequent parametric perfusion maps were calculated using RAPID software. CONTRAST:  ISOVUE-370 IOPAMIDOL (ISOVUE-370) INJECTION 76% COMPARISON:  None. FINDINGS: CTA NECK FINDINGS Aortic arch: Standard branching. Imaged portion shows no evidence of aneurysm or dissection. No significant stenosis of the major arch vessel origins. Right carotid system: No evidence of dissection, stenosis (50% or greater) or occlusion. Non stenotic calcific atherosclerosis of the carotid bifurcation. Left carotid system: Left proximal ICA thromboembolism. The ICA downstream to the thrombosis is opacified with contrast suggesting that the thrombus is near occlusive but not totally occlusive. Vertebral arteries: Left dominant. No evidence of dissection, stenosis (50% or greater) or occlusion. Skeleton: No acute finding. Other neck: 7 mm nodule within the left lobe of the thyroid gland. Upper chest: Negative. Review of the MIP images confirms the above findings CTA HEAD FINDINGS Anterior circulation: Non stenotic calcific atherosclerosis of the carotid siphons. Large right A1, large anterior communicating artery, diminutive left A1, normal variant. Patent bilateral ACA and right MCA distributions. Left M1 occlusion with poor left MCA distribution collateralization. Posterior circulation: No significant stenosis, proximal occlusion, aneurysm, or vascular malformation. Venous sinuses: As permitted by contrast timing, patent. Anatomic variants: None significant. Review of the MIP images confirms the  above findings CT Brain Perfusion Findings: CBF (<30%) Volume: 11mL Perfusion (Tmax>6.0s) volume: 77mL Mismatch Volume: 66mL Infarction Location:Left MCA distribution, ASPECTS 8 stroke on noncontrast CT. IMPRESSION: CT brain perfusion: Left MCA distribution large ischemic penumbra, 77 cc. Left frontal white matter and insula core infarct of 11 cc. ASPECTS 8 infarct on noncontrast CT of head with possible pseudonormalization of perfusion. CTA head: 1. Left M1 occlusion.  Poor left MCA distribution collateralization. 2. No additional intracranial large vessel occlusion, high-grade stenosis, aneurysm, or vascular malformation. CTA neck: 1. Left proximal ICA thromboembolism. The ICA downstream to the thrombosis is opacified with contrast suggesting that the thrombus is near occlusive, but not totally occlusive. 2. Patent right carotid system and bilateral vertebral arteries without hemodynamically significant stenosis by NASCET criteria, dissection, or aneurysm. These results were called by telephone at the time of interpretation on 04/17/2018 at 9:24 pm to Dr. Daryel November , who verbally acknowledged these results. Electronically Signed   By: Mitzi Hansen M.D.   On: 04/17/2018 21:35   Mr Brain Wo Contrast  Result Date: 04/18/2018 CLINICAL DATA:  Status post endovascular revascularization of stenotic LEFT ICA. History of hypertension. EXAM: MRI HEAD WITHOUT CONTRAST TECHNIQUE: Multiplanar, multiecho pulse sequences of the brain and surrounding structures were obtained without intravenous contrast. COMPARISON:  None. FINDINGS: INTRACRANIAL CONTENTS: Confluent LEFT frontotemporal, operculum and insula, LEFT basal ganglia reduced diffusion. Patchy areas of reduced diffusion LEFT frontal convexity, LEFT parietal lobe. Areas of reduced diffusion demonstrate low ADC values; microhemorrhages within several areas of acute infarcts. Scattered subcentimeter supratentorial white matter FLAIR T2 hyperintensities  exclusive aforementioned abnormality compatible with mild chronic small vessel ischemic changes. No parenchymal brain volume loss for age. No midline shift. No abnormal extra-axial fluid collections. VASCULAR:  Normal major intracranial vascular flow voids present at skull base. SKULL AND UPPER CERVICAL SPINE: No abnormal sellar expansion. No suspicious calvarial bone marrow signal. Small subgaleal fluid collections. Craniocervical junction maintained. SINUSES/ORBITS: Moderate paranasal sinusitis with air-fluid levels. Mastoid air cells are well aerated.The included ocular globes and orbital contents are non-suspicious. OTHER: Life support lines in place. IMPRESSION: 1. Multifocal acute LEFT MCA territory infarcts and anterior/posterior border zone infarcts with scattered petechial hemorrhage. Electronically Signed   By: Awilda Metro M.D.   On: 04/18/2018 19:30   Ir Delsa Sale Stent Cerv Carotid W/o Emb-prot Mod Sed  Result Date: 04/19/2018 INDICATION: Acute onset of right-sided hemiplegia, left gaze deviation, and aphasia. Occluded left middle cerebral artery M1 segment, and near complete occlusion of the left internal carotid artery at the bulb by CT angiogram of the head and neck. EXAM: 1. EMERGENT LARGE VESSEL OCCLUSION THROMBOLYSIS (anterior CIRCULATION) COMPARISON:  CT angiogram of the head and neck of 04/17/2018. MEDICATIONS: Ancef 2 g IV antibiotic was administered within 1 hour of the procedure. ANESTHESIA/SEDATION: General anesthesia. CONTRAST:  Isovue 300 approximately 85 cc. FLUOROSCOPY TIME:  Fluoroscopy Time: 35 minutes 54 seconds (1959 mGy). COMPLICATIONS: None immediate. TECHNIQUE: Following a full explanation of the procedure along with the potential associated complications, an informed witnessed consent was obtained from the spouse. The risks of intracranial hemorrhage of 10%, worsening neurological deficit, ventilator dependency, death and inability to revascularize were all reviewed in  detail with the patient's spouse. The patient was then put under general anesthesia by the Department of Anesthesiology at [facility]. The right groin was prepped and draped in the usual sterile fashion. Thereafter using modified Seldinger technique, transfemoral access into the right common femoral artery was obtained without difficulty. Over a 0.035 inch guidewire a 5 French Pinnacle sheath was inserted. Through this, and also over a 0.035 inch guidewire a 5 [place] JB 1 catheter was advanced to the aortic arch region and selectively positioned in the left common carotid artery. FINDINGS: The left common carotid arteriogram demonstrates the left external carotid artery and its major branches to be widely patent. The left internal carotid artery just distal to the bulb has a severe irregular stenosis with near complete occlusion. This is associated with calcification along the lateral wall of the proximal left internal carotid artery. More distally, the vessel opacifies slowly to the cranial skull base. Wide patency is seen of the petrous and the cavernous segments. Complete angiographic occlusion was seen of the supraclinoid left ICA. Poor opacification is seen of the left posterior communicating artery and suggestion of trickle flow through the proximal left middle cerebral artery M1 segment. There is no opacification of the left anterior cerebral A1 segment. PROCEDURE: The diagnostic JB 1 catheter in the left common carotid artery was exchanged over a 0.035 inch 300 cm Rosen exchange guidewire for an 8 French 55 cm Brite tip neurovascular sheath. Good aspiration obtained from the side port of the neurovascular sheath. This was then connected to continuous heparinized saline infusion. Over the exchange guidewire, an 8 [place] 85 cm FlowGate balloon guide catheter which had been prepped with 50% contrast and 50% heparinized saline infusion was advanced and positioned just proximal to the left common carotid  bifurcation. The guidewire was removed. Good aspiration obtained from the hub of the [facility] guide catheter. Gentle control arteriogram demonstrated no evidence of spasms, dissections or of intraluminal filling defects. Also there continued be severe high-grade pre occlusive stenosis of the left internal carotid proximally. Over a  0.014 inch Softip Synchro micro guidewire, an 021 Trevo ProVue microcatheter was advanced to just distal to the [facility] guide catheter. The micro guidewire was then gently manipulated with a torque device and advanced through the nearly completely occluded left internal carotid artery at the bulb. The combination was navigated without difficulty to the horizontal petrous segment. The guidewire was removed. Good aspiration obtained from the tip of the microcatheter now in the petrous horizontal segment. This in turn was then exchanged for a 0.014 inch Softip Transend 300 cm exchange micro guidewire using biplane roadmap technique and constant fluoroscopic guidance. The micro guidewire had a J configuration to avoid dissections or inducing spasm. A control arteriogram performed through the [facility] guide catheter in the left common carotid artery demonstrated slow antegrade flow distally. A 4 mm x 30 mm Viatrac 014 angioplasty balloon catheter which had been prepped with heparinized saline infusion was advanced using the rapid exchange technique and positioned adequate distance from the site of severe stenosis of the left internal carotid artery. Control angioplasty was then performed using a micro inflation syringe device via micro tubing where the balloon was inflated just above 8 atmospheres where it was maintained for about 30 seconds. Balloon was then deflated and retrieved proximally as the distal wire was maintained in the petrous horizontal segment. A control arteriogram performed through the [facility] guide catheter demonstrated significantly improved caliber and flow through the  left internal carotid artery proximally. Over the exchange 014 inch micro guidewire, a Trevo ProVue microcatheter inside of a 132 cm 6 [place] Catalyst guide catheter combination was advanced and positioned in the horizontal petrous segment. The guidewire was removed. Good aspiration obtained from the hub of the microcatheter. A control arteriogram was then performed through the 6 [place] Catalyst catheter which demonstrated angiographic occlusion of the supraclinoid left ICA with no flow in the left middle cerebral artery and slow flow in the anterior cerebral artery A1 segment with a filling defect. Over a 0.014 inch Softip Synchro micro guidewire, the combination of the microcatheter and the Catalyst guide catheter were advanced to the supraclinoid left ICA. The micro guidewire was then gently manipulated with a torque device and advanced through the occluded left middle cerebral artery into the dominant inferior division followed by the microcatheter. The Catalyst guide catheter was advanced to the supraclinoid left ICA. The micro guidewire was then retrieved and removed. Good aspiration obtained from the hub of the microcatheter. A gentle control arteriogram performed through the microcatheter demonstrated safe position of tip of the microcatheter. This was then connected to continuous heparinized saline infusion. A 5 mm x 33 mm Embotrap retrieval device was then advanced to the distal end of the microcatheter. At this time the Catalyst guide catheter was advanced into the proximal left M1 segment. The O ring on the delivery microcatheter was then loosened. With slight gentle forward traction with the right hand on the delivery micro guidewire, with the left hand the delivery microcatheter was retrieved unsheathing the distal and then the proximal portion of the retrieval device. A gentle control arteriogram performed through the 6 [place] Catalyst guide catheter in the proximal left middle cerebral demonstrated  complete angiographic nonvisualization. With proximal flow arrest of the Catalyst guide catheter in the mid left internal carotid artery cervical segment, the combination of the retrieval device, the microcatheter and the 6 [place] Catalyst guide catheter was retrieved as constant aspiration was applied at the hub of the [employer] guide catheter, and also at the hub  of the Catalyst guide catheter using a Penumbra suction aspiration device. Complete lock down of the Penumbra device was noted with no aspirate being obtained while the combination was retrieved and removed. Aspiration was continued as proximal flow arrest was reversed in the left internal carotid artery. Copious amounts of clot were noted in the aspirate, and also in the [employer] [place] at the hub of the [facility] guide catheter, and also in the Penumbra aspiration collector device. A control arteriogram performed through the [facility] guide catheter in the left internal carotid artery demonstrated complete angiographic revascularization of the left middle cerebral artery and flash filling of the left anterior cerebral artery to complete distribution. No angiographic evidence of filling defects or of occlusions were seen in the left MCA or left anterior cerebral artery distributions. There was, however, severe tapered narrowing due to spasm noted in the junction of the middle and the proximal 1/3 of the left internal carotid artery. The exchange 014 inch Transend EX micro guidewire was then advanced using biplane roadmap technique constant fluoroscopic guidance to the petrous segment of the left internal carotid artery through the [facility] guide catheter. With the micro guidewire stable in the petrous segment, the [facility] guide catheter was retrieved to just proximal to the left common carotid bifurcation. A control arteriogram performed at this site demonstrated continued significant narrowing of the previously angioplastied site. It was decided to  place a 6 mm/8 mm x 40 mm Xact stent across the previously symptomatic nearly completely occluded left internal carotid artery. Measurements had been performed of the left internal carotid artery just distal to the high-grade stenosis in the left internal carotid artery, and left common carotid artery proximally. The delivery portion of the stent was then retrogradely purged with heparinized saline infusion. Using the rapid exchange technique, the delivery apparatus with the stent was then advanced and positioned such that there was adequate coverage of the angioplastied segment distally and also proximally. Once established, this stent was then deployed without difficulty. With the exchange wire maintained distally, the delivery apparatus was retrieved and removed. A control arteriogram performed through the 8 [place] FlowGate guide catheter in the left common carotid artery demonstrated excellent apposition in the AP and lateral projections. No evidence of intraluminal filling defects were seen. A control arteriogram performed centered extra cranially and intracranially demonstrated significant spasm of the left internal carotid artery in the mid cervical portion. More distally, the left middle cerebral artery and the left anterior cerebral artery maintained complete revascularization without evidence of filling defects or of occlusions. Control arteriogram was then performed at 15, 30 and 40 minutes post deployment of the stent. Given the significantly improved caliber in the AP and lateral projections, a post stent angioplasty was not performed. During this time, the patient was given a total of 3 mg of intra-arterial Integrilin through the [facility] guide catheter in the left common carotid artery to ensure no development of intraluminal platelet aggregation. Prior to the final arteriogram, the exchange micro guidewire was retrieved and removed. There was significant improvement in the spasm. Control arteriogram  performed through the 8 [place] FlowGate guide catheter demonstrated excellent flow through the angioplastied segment with the stent in the proximal left internal carotid artery, with a TICI 3 revascularization achieved. Prior to the extracranial angioplasty of the high-grade proximal ICA stenosis, the patient was loaded with 180 mg of Brilinta, and 81 mg of aspirin via an orogastric tube. No angiographic evidence of intracranial extravasation, mass or midline shift was seen. The  patient's neurological and hemodynamic status remained stable. The FlowGate guide catheter was then retrieved into the abdominal aorta as was the 8 French neurovascular sheath. Both of these were then exchanged for an 8 [place] Pinnacle sheath. This in turn was then removed with the successful hemostasis achieved at the right groin puncture site using an 8 French Angio-Seal closure device. The right groin appeared soft without evidence of hematoma or bleeding. Distal pulses remained palpable in the dorsalis pedis, and posterior tibial regions bilaterally at the end of the procedure. A Dyna CT of the brain revealed no evidence of intracranial hemorrhage, mass effect or midline shift. Patient was left intubated to prevent aspiration due to difficulty controlling secretions prior to intubation. The patient was then transferred to the neuro ICU to continue with further management. IMPRESSION: Status post endovascular complete revascularization of occluded left internal carotid artery terminus, left middle cerebral artery M1 segment, and left anterior cerebral artery, with 1 pass with a 5 mm x 33 mm Embotrap retrieval device, and aspiration assistance with a Penumbra aspiration simultaneously as described achieving a TICI 3 revascularization. Status post endovascular revascularization of now complete occlusion of the symptomatic left internal carotid artery proximally with stent assisted angioplasty. PLAN: Follow-up in clinic 4 weeks post  discharge. Electronically Signed   By: Julieanne Cotton M.D.   On: 04/18/2018 10:47   [facility]  Result Date: 04/20/2018 INDICATION: Acute onset of right-sided hemiplegia, left gaze deviation, and aphasia. Occluded left middle cerebral artery M1 segment, and near complete occlusion of the left internal carotid artery at the bulb by CT angiogram of the head and neck. EXAM: 1. EMERGENT LARGE VESSEL OCCLUSION THROMBOLYSIS (anterior CIRCULATION) COMPARISON:  CT angiogram of the head and neck of 04/17/2018. MEDICATIONS: Ancef 2 g IV antibiotic was administered within 1 hour of the procedure. ANESTHESIA/SEDATION: General anesthesia. CONTRAST:  Isovue 300 approximately 85 cc. FLUOROSCOPY TIME:  Fluoroscopy Time: 35 minutes 54 seconds (1959 mGy). COMPLICATIONS: None immediate. TECHNIQUE: Following a full explanation of the procedure along with the potential associated complications, an informed witnessed consent was obtained from the spouse. The risks of intracranial hemorrhage of 10%, worsening neurological deficit, ventilator dependency, death and inability to revascularize were all reviewed in detail with the patient's spouse. The patient was then put under general anesthesia by the Department of Anesthesiology at [facility]. The right groin was prepped and draped in the usual sterile fashion. Thereafter using modified Seldinger technique, transfemoral access into the right common femoral artery was obtained without difficulty. Over a 0.035 inch guidewire a 5 French Pinnacle sheath was inserted. Through this, and also over a 0.035 inch guidewire a 5 [place] JB 1 catheter was advanced to the aortic arch region and selectively positioned in the left common carotid artery. FINDINGS: The left common carotid arteriogram demonstrates the left external carotid artery and its major branches to be widely patent. The left internal carotid artery just distal to the bulb has a severe irregular stenosis with near  complete occlusion. This is associated with calcification along the lateral wall of the proximal left internal carotid artery. More distally, the vessel opacifies slowly to the cranial skull base. Wide patency is seen of the petrous and the cavernous segments. Complete angiographic occlusion was seen of the supraclinoid left ICA. Poor opacification is seen of the left posterior communicating artery and suggestion of trickle flow through the proximal left middle cerebral artery M1 segment. There is no opacification of the left anterior cerebral A1 segment.  PROCEDURE: The diagnostic JB 1 catheter in the left common carotid artery was exchanged over a 0.035 inch 300 cm Rosen exchange guidewire for an 8 French 55 cm Brite tip neurovascular sheath. Good aspiration obtained from the side port of the neurovascular sheath. This was then connected to continuous heparinized saline infusion. Over the exchange guidewire, an 8 [place] 85 cm FlowGate balloon guide catheter which had been prepped with 50% contrast and 50% heparinized saline infusion was advanced and positioned just proximal to the left common carotid bifurcation. The guidewire was removed. Good aspiration obtained from the hub of the [facility] guide catheter. Gentle control arteriogram demonstrated no evidence of spasms, dissections or of intraluminal filling defects. Also there continued be severe high-grade pre occlusive stenosis of the left internal carotid proximally. Over a 0.014 inch Softip Synchro micro guidewire, an 021 Trevo ProVue microcatheter was advanced to just distal to the [facility] guide catheter. The micro guidewire was then gently manipulated with a torque device and advanced through the nearly completely occluded left internal carotid artery at the bulb. The combination was navigated without difficulty to the horizontal petrous segment. The guidewire was removed. Good aspiration obtained from the tip of the microcatheter now in the petrous  horizontal segment. This in turn was then exchanged for a 0.014 inch Softip Transend 300 cm exchange micro guidewire using biplane roadmap technique and constant fluoroscopic guidance. The micro guidewire had a J configuration to avoid dissections or inducing spasm. A control arteriogram performed through the [facility] guide catheter in the left common carotid artery demonstrated slow antegrade flow distally. A 4 mm x 30 mm Viatrac 014 angioplasty balloon catheter which had been prepped with heparinized saline infusion was advanced using the rapid exchange technique and positioned adequate distance from the site of severe stenosis of the left internal carotid artery. Control angioplasty was then performed using a micro inflation syringe device via micro tubing where the balloon was inflated just above 8 atmospheres where it was maintained for about 30 seconds. Balloon was then deflated and retrieved proximally as the distal wire was maintained in the petrous horizontal segment. A control arteriogram performed through the [facility] guide catheter demonstrated significantly improved caliber and flow through the left internal carotid artery proximally. Over the exchange 014 inch micro guidewire, a Trevo ProVue microcatheter inside of a 132 cm 6 [place] Catalyst guide catheter combination was advanced and positioned in the horizontal petrous segment. The guidewire was removed. Good aspiration obtained from the hub of the microcatheter. A control arteriogram was then performed through the 6 [place] Catalyst catheter which demonstrated angiographic occlusion of the supraclinoid left ICA with no flow in the left middle cerebral artery and slow flow in the anterior cerebral artery A1 segment with a filling defect. Over a 0.014 inch Softip Synchro micro guidewire, the combination of the microcatheter and the Catalyst guide catheter were advanced to the supraclinoid left ICA. The micro guidewire was then gently manipulated with a  torque device and advanced through the occluded left middle cerebral artery into the dominant inferior division followed by the microcatheter. The Catalyst guide catheter was advanced to the supraclinoid left ICA. The micro guidewire was then retrieved and removed. Good aspiration obtained from the hub of the microcatheter. A gentle control arteriogram performed through the microcatheter demonstrated safe position of tip of the microcatheter. This was then connected to continuous heparinized saline infusion. A 5 mm x 33 mm Embotrap retrieval device was then advanced to the distal end of the microcatheter. At  this time the Catalyst guide catheter was advanced into the proximal left M1 segment. The O ring on the delivery microcatheter was then loosened. With slight gentle forward traction with the right hand on the delivery micro guidewire, with the left hand the delivery microcatheter was retrieved unsheathing the distal and then the proximal portion of the retrieval device. A gentle control arteriogram performed through the 6 [place] Catalyst guide catheter in the proximal left middle cerebral demonstrated complete angiographic nonvisualization. With proximal flow arrest of the Catalyst guide catheter in the mid left internal carotid artery cervical segment, the combination of the retrieval device, the microcatheter and the 6 [place] Catalyst guide catheter was retrieved as constant aspiration was applied at the hub of the 8 [employer] guide catheter, and also at the hub of the Catalyst guide catheter using a Penumbra suction aspiration device. Complete lock down of the Penumbra device was noted with no aspirate being obtained while the combination was retrieved and removed. Aspiration was continued as proximal flow arrest was reversed in the left internal carotid artery. Copious amounts of clot were noted in the aspirate, and also in the [place] at the hub of the [facility] guide catheter, and also in the  Penumbra aspiration collector device. A control arteriogram performed through the [facility] guide catheter in the left internal carotid artery demonstrated complete angiographic revascularization of the left middle cerebral artery and flash filling of the left anterior cerebral artery to complete distribution. No angiographic evidence of filling defects or of occlusions were seen in the left MCA or left anterior cerebral artery distributions. There was, however, severe tapered narrowing due to spasm noted in the junction of the middle and the proximal 1/3 of the left internal carotid artery. The exchange 014 inch Transend EX micro guidewire was then advanced using biplane roadmap technique constant fluoroscopic guidance to the petrous segment of the left internal carotid artery through the [facility] guide catheter. With the micro guidewire stable in the petrous segment, the [facility] guide catheter was retrieved to just proximal to the left common carotid bifurcation. A control arteriogram performed at this site demonstrated continued significant narrowing of the previously angioplastied site. It was decided to place a 6 mm/8 mm x 40 mm Xact stent across the previously symptomatic nearly completely occluded left internal carotid artery. Measurements had been performed of the left internal carotid artery just distal to the high-grade stenosis in the left internal carotid artery, and left common carotid artery proximally. The delivery portion of the stent was then retrogradely purged with heparinized saline infusion. Using the rapid exchange technique, the delivery apparatus with the stent was then advanced and positioned such that there was adequate coverage of the angioplastied segment distally and also proximally. Once established, this stent was then deployed without difficulty. With the exchange wire maintained distally, the delivery apparatus was retrieved and removed. A control arteriogram performed through the 8  [place] FlowGate guide catheter in the left common carotid artery demonstrated excellent apposition in the AP and lateral projections. No evidence of intraluminal filling defects were seen. A control arteriogram performed centered extra cranially and intracranially demonstrated significant spasm of the left internal carotid artery in the mid cervical portion. More distally, the left middle cerebral artery and the left anterior cerebral artery maintained complete revascularization without evidence of filling defects or of occlusions. Control arteriogram was then performed at 15, 30 and 40 minutes post deployment of the stent. Given the significantly improved caliber in the AP and lateral projections,  a post stent angioplasty was not performed. During this time, the patient was given a total of 3 mg of intra-arterial Integrilin through the [facility] guide catheter in the left common carotid artery to ensure no development of intraluminal platelet aggregation. Prior to the final arteriogram, the exchange micro guidewire was retrieved and removed. There was significant improvement in the spasm. Control arteriogram performed through the 8 [place] FlowGate guide catheter demonstrated excellent flow through the angioplastied segment with the stent in the proximal left internal carotid artery, with a TICI 3 revascularization achieved. Prior to the extracranial angioplasty of the high-grade proximal ICA stenosis, the patient was loaded with 180 mg of Brilinta, and 81 mg of aspirin via an orogastric tube. No angiographic evidence of intracranial extravasation, mass or midline shift was seen. The patient's neurological and hemodynamic status remained stable. The FlowGate guide catheter was then retrieved into the abdominal aorta as was the 8 French neurovascular sheath. Both of these were then exchanged for an 8 [place] Pinnacle sheath. This in turn was then removed with the successful hemostasis achieved at the right groin  puncture site using an 8 French Angio-Seal closure device. The right groin appeared soft without evidence of hematoma or bleeding. Distal pulses remained palpable in the dorsalis pedis, and posterior tibial regions bilaterally at the end of the procedure. A Dyna CT of the brain revealed no evidence of intracranial hemorrhage, mass effect or midline shift. Patient was left intubated to prevent aspiration due to difficulty controlling secretions prior to intubation. The patient was then transferred to the neuro ICU to continue with further management. IMPRESSION: Status post endovascular complete revascularization of occluded left internal carotid artery terminus, left middle cerebral artery M1 segment, and left anterior cerebral artery, with 1 pass with a 5 mm x 33 mm Embotrap retrieval device, and aspiration assistance with a Penumbra aspiration simultaneously as described achieving a TICI 3 revascularization. Status post endovascular revascularization of now complete occlusion of the symptomatic left internal carotid artery proximally with stent assisted angioplasty. PLAN: Follow-up in clinic 4 weeks post discharge. Electronically Signed   By: Julieanne Cotton M.D.   On: 04/18/2018 10:47   Ir [facility]  Result Date: 04/19/2018 INDICATION: Acute onset of right-sided hemiplegia, left gaze deviation, and aphasia. Occluded left middle cerebral artery M1 segment, and near complete occlusion of the left internal carotid artery at the bulb by CT angiogram of the head and neck. EXAM: 1. EMERGENT LARGE VESSEL OCCLUSION THROMBOLYSIS (anterior CIRCULATION) COMPARISON:  CT angiogram of the head and neck of 04/17/2018. MEDICATIONS: Ancef 2 g IV antibiotic was administered within 1 hour of the procedure. ANESTHESIA/SEDATION: General anesthesia. CONTRAST:  Isovue 300 approximately 85 cc. FLUOROSCOPY TIME:  Fluoroscopy Time: 35 minutes 54 seconds (1959 mGy). COMPLICATIONS: None immediate. TECHNIQUE: Following a full  explanation of the procedure along with the potential associated complications, an informed witnessed consent was obtained from the spouse. The risks of intracranial hemorrhage of 10%, worsening neurological deficit, ventilator dependency, death and inability to revascularize were all reviewed in detail with the patient's spouse. The patient was then put under general anesthesia by the Department of Anesthesiology at [facility]. The right groin was prepped and draped in the usual sterile fashion. Thereafter using modified Seldinger technique, transfemoral access into the right common femoral artery was obtained without difficulty. Over a 0.035 inch guidewire a 5 French Pinnacle sheath was inserted. Through this, and also over a 0.035 inch guidewire a 5 [place] JB 1 catheter was advanced  to the aortic arch region and selectively positioned in the left common carotid artery. FINDINGS: The left common carotid arteriogram demonstrates the left external carotid artery and its major branches to be widely patent. The left internal carotid artery just distal to the bulb has a severe irregular stenosis with near complete occlusion. This is associated with calcification along the lateral wall of the proximal left internal carotid artery. More distally, the vessel opacifies slowly to the cranial skull base. Wide patency is seen of the petrous and the cavernous segments. Complete angiographic occlusion was seen of the supraclinoid left ICA. Poor opacification is seen of the left posterior communicating artery and suggestion of trickle flow through the proximal left middle cerebral artery M1 segment. There is no opacification of the left anterior cerebral A1 segment. PROCEDURE: The diagnostic JB 1 catheter in the left common carotid artery was exchanged over a 0.035 inch 300 cm Rosen exchange guidewire for an 8 French 55 cm Brite tip neurovascular sheath. Good aspiration obtained from the side port of the neurovascular  sheath. This was then connected to continuous heparinized saline infusion. Over the exchange guidewire, an 8 [place] 85 cm FlowGate balloon guide catheter which had been prepped with 50% contrast and 50% heparinized saline infusion was advanced and positioned just proximal to the left common carotid bifurcation. The guidewire was removed. Good aspiration obtained from the hub of the [facility] guide catheter. Gentle control arteriogram demonstrated no evidence of spasms, dissections or of intraluminal filling defects. Also there continued be severe high-grade pre occlusive stenosis of the left internal carotid proximally. Over a 0.014 inch Softip Synchro micro guidewire, an 021 Trevo ProVue microcatheter was advanced to just distal to the [facility] guide catheter. The micro guidewire was then gently manipulated with a torque device and advanced through the nearly completely occluded left internal carotid artery at the bulb. The combination was navigated without difficulty to the horizontal petrous segment. The guidewire was removed. Good aspiration obtained from the tip of the microcatheter now in the petrous horizontal segment. This in turn was then exchanged for a 0.014 inch Softip Transend 300 cm exchange micro guidewire using biplane roadmap technique and constant fluoroscopic guidance. The micro guidewire had a J configuration to avoid dissections or inducing spasm. A control arteriogram performed through the [facility] guide catheter in the left common carotid artery demonstrated slow antegrade flow distally. A 4 mm x 30 mm Viatrac 014 angioplasty balloon catheter which had been prepped with heparinized saline infusion was advanced using the rapid exchange technique and positioned adequate distance from the site of severe stenosis of the left internal carotid artery. Control angioplasty was then performed using a micro inflation syringe device via micro tubing where the balloon was inflated just above 8 atmospheres  where it was maintained for about 30 seconds. Balloon was then deflated and retrieved proximally as the distal wire was maintained in the petrous horizontal segment. A control arteriogram performed through the [facility] guide catheter demonstrated significantly improved caliber and flow through the left internal carotid artery proximally. Over the exchange 014 inch micro guidewire, a Trevo ProVue microcatheter inside of a 132 cm 6 [place] Catalyst guide catheter combination was advanced and positioned in the horizontal petrous segment. The guidewire was removed. Good aspiration obtained from the hub of the microcatheter. A control arteriogram was then performed through the 6 [place] Catalyst catheter which demonstrated angiographic occlusion of the supraclinoid left ICA with no flow in the left middle cerebral artery and slow flow in the  anterior cerebral artery A1 segment with a filling defect. Over a 0.014 inch Softip Synchro micro guidewire, the combination of the microcatheter and the Catalyst guide catheter were advanced to the supraclinoid left ICA. The micro guidewire was then gently manipulated with a torque device and advanced through the occluded left middle cerebral artery into the dominant inferior division followed by the microcatheter. The Catalyst guide catheter was advanced to the supraclinoid left ICA. The micro guidewire was then retrieved and removed. Good aspiration obtained from the hub of the microcatheter. A gentle control arteriogram performed through the microcatheter demonstrated safe position of tip of the microcatheter. This was then connected to continuous heparinized saline infusion. A 5 mm x 33 mm Embotrap retrieval device was then advanced to the distal end of the microcatheter. At this time the Catalyst guide catheter was advanced into the proximal left M1 segment. The O ring on the delivery microcatheter was then loosened. With slight gentle forward traction with the right hand on the  delivery micro guidewire, with the left hand the delivery microcatheter was retrieved unsheathing the distal and then the proximal portion of the retrieval device. A gentle control arteriogram performed through the 6 [place] Catalyst guide catheter in the proximal left middle cerebral demonstrated complete angiographic nonvisualization. With proximal flow arrest of the Catalyst guide catheter in the mid left internal carotid artery cervical segment, the combination of the retrieval device, the microcatheter and the 6 [place] Catalyst guide catheter was retrieved as constant aspiration was applied at the hub of the [employer] guide catheter, and also at the hub of the Catalyst guide catheter using a Penumbra suction aspiration device. Complete lock down of the Penumbra device was noted with no aspirate being obtained while the combination was retrieved and removed. Aspiration was continued as proximal flow arrest was reversed in the left internal carotid artery. Copious amounts of clot were noted in the aspirate, and also in the [place] at the hub of the [facility] guide catheter, and also in the Penumbra aspiration collector device. A control arteriogram performed through the [facility] guide catheter in the left internal carotid artery demonstrated complete angiographic revascularization of the left middle cerebral artery and flash filling of the left anterior cerebral artery to complete distribution. No angiographic evidence of filling defects or of occlusions were seen in the left MCA or left anterior cerebral artery distributions. There was, however, severe tapered narrowing due to spasm noted in the junction of the middle and the proximal 1/3 of the left internal carotid artery. The exchange 014 inch Transend EX micro guidewire was then advanced using biplane roadmap technique constant fluoroscopic guidance to the petrous segment of the left internal carotid artery through the [facility] guide catheter. With  the micro guidewire stable in the petrous segment, the [facility] guide catheter was retrieved to just proximal to the left common carotid bifurcation. A control arteriogram performed at this site demonstrated continued significant narrowing of the previously angioplastied site. It was decided to place a 6 mm/8 mm x 40 mm Xact stent across the previously symptomatic nearly completely occluded left internal carotid artery. Measurements had been performed of the left internal carotid artery just distal to the high-grade stenosis in the left internal carotid artery, and left common carotid artery proximally. The delivery portion of the stent was then retrogradely purged with heparinized saline infusion. Using the rapid exchange technique, the delivery apparatus with the stent was then advanced and positioned such that there was adequate coverage of the  angioplastied segment distally and also proximally. Once established, this stent was then deployed without difficulty. With the exchange wire maintained distally, the delivery apparatus was retrieved and removed. A control arteriogram performed through the 8 [place] FlowGate guide catheter in the left common carotid artery demonstrated excellent apposition in the AP and lateral projections. No evidence of intraluminal filling defects were seen. A control arteriogram performed centered extra cranially and intracranially demonstrated significant spasm of the left internal carotid artery in the mid cervical portion. More distally, the left middle cerebral artery and the left anterior cerebral artery maintained complete revascularization without evidence of filling defects or of occlusions. Control arteriogram was then performed at 15, 30 and 40 minutes post deployment of the stent. Given the significantly improved caliber in the AP and lateral projections, a post stent angioplasty was not performed. During this time, the patient was given a total of 3 mg of intra-arterial  Integrilin through the [facility] guide catheter in the left common carotid artery to ensure no development of intraluminal platelet aggregation. Prior to the final arteriogram, the exchange micro guidewire was retrieved and removed. There was significant improvement in the spasm. Control arteriogram performed through the 8 [place] FlowGate guide catheter demonstrated excellent flow through the angioplastied segment with the stent in the proximal left internal carotid artery, with a TICI 3 revascularization achieved. Prior to the extracranial angioplasty of the high-grade proximal ICA stenosis, the patient was loaded with 180 mg of Brilinta, and 81 mg of aspirin via an orogastric tube. No angiographic evidence of intracranial extravasation, mass or midline shift was seen. The patient's neurological and hemodynamic status remained stable. The FlowGate guide catheter was then retrieved into the abdominal aorta as was the 8 French neurovascular sheath. Both of these were then exchanged for an 8 [place] Pinnacle sheath. This in turn was then removed with the successful hemostasis achieved at the right groin puncture site using an 8 French Angio-Seal closure device. The right groin appeared soft without evidence of hematoma or bleeding. Distal pulses remained palpable in the dorsalis pedis, and posterior tibial regions bilaterally at the end of the procedure. A Dyna CT of the brain revealed no evidence of intracranial hemorrhage, mass effect or midline shift. Patient was left intubated to prevent aspiration due to difficulty controlling secretions prior to intubation. The patient was then transferred to the neuro ICU to continue with further management. IMPRESSION: Status post endovascular complete revascularization of occluded left internal carotid artery terminus, left middle cerebral artery M1 segment, and left anterior cerebral artery, with 1 pass with a 5 mm x 33 mm Embotrap retrieval device, and aspiration assistance  with a Penumbra aspiration simultaneously as described achieving a TICI 3 revascularization. Status post endovascular revascularization of now complete occlusion of the symptomatic left internal carotid artery proximally with stent assisted angioplasty. PLAN: Follow-up in clinic 4 weeks post discharge. Electronically Signed   By: Julieanne Cotton M.D.   On: 04/18/2018 10:47   Ct Cerebral Perfusion W Contrast  Result Date: 04/17/2018 EXAM: CT ANGIOGRAPHY HEAD AND NECK CT PERFUSION BRAIN TECHNIQUE: Multidetector CT imaging of the head and neck was performed using the standard protocol during bolus administration of intravenous contrast. Multiplanar CT image reconstructions and MIPs were obtained to evaluate the vascular anatomy. Carotid stenosis measurements (when applicable) are obtained utilizing NASCET criteria, using the distal internal carotid diameter as the denominator. Multiphase CT imaging of the brain was performed following IV bolus contrast injection. Subsequent parametric perfusion maps were calculated using RAPID software.  CONTRAST:  ISOVUE-370 IOPAMIDOL (ISOVUE-370) INJECTION 76% COMPARISON:  None. FINDINGS: CTA NECK FINDINGS Aortic arch: Standard branching. Imaged portion shows no evidence of aneurysm or dissection. No significant stenosis of the major arch vessel origins. Right carotid system: No evidence of dissection, stenosis (50% or greater) or occlusion. Non stenotic calcific atherosclerosis of the carotid bifurcation. Left carotid system: Left proximal ICA thromboembolism. The ICA downstream to the thrombosis is opacified with contrast suggesting that the thrombus is near occlusive but not totally occlusive. Vertebral arteries: Left dominant. No evidence of dissection, stenosis (50% or greater) or occlusion. Skeleton: No acute finding. Other neck: 7 mm nodule within the left lobe of the thyroid gland. Upper chest: Negative. Review of the MIP images confirms the above findings CTA HEAD  FINDINGS Anterior circulation: Non stenotic calcific atherosclerosis of the carotid siphons. Large right A1, large anterior communicating artery, diminutive left A1, normal variant. Patent bilateral ACA and right MCA distributions. Left M1 occlusion with poor left MCA distribution collateralization. Posterior circulation: No significant stenosis, proximal occlusion, aneurysm, or vascular malformation. Venous sinuses: As permitted by contrast timing, patent. Anatomic variants: None significant. Review of the MIP images confirms the above findings CT Brain Perfusion Findings: CBF (<30%) Volume: 68mL Perfusion (Tmax>6.0s) volume: 60mL Mismatch Volume: 28mL Infarction Location:Left MCA distribution, ASPECTS 8 stroke on noncontrast CT. IMPRESSION: CT brain perfusion: Left MCA distribution large ischemic penumbra, 77 cc. Left frontal white matter and insula core infarct of 11 cc. ASPECTS 8 infarct on noncontrast CT of head with possible pseudonormalization of perfusion. CTA head: 1. Left M1 occlusion.  Poor left MCA distribution collateralization. 2. No additional intracranial large vessel occlusion, high-grade stenosis, aneurysm, or vascular malformation. CTA neck: 1. Left proximal ICA thromboembolism. The ICA downstream to the thrombosis is opacified with contrast suggesting that the thrombus is near occlusive, but not totally occlusive. 2. Patent right carotid system and bilateral vertebral arteries without hemodynamically significant stenosis by NASCET criteria, dissection, or aneurysm. These results were called by telephone at the time of interpretation on 04/17/2018 at 9:24 pm to Dr. Daryel November , who verbally acknowledged these results. Electronically Signed   By: Mitzi Hansen M.D.   On: 04/17/2018 21:35   Dg Chest Port 1 View  Result Date: 04/19/2018 CLINICAL DATA:  Acute respiratory failure EXAM: PORTABLE CHEST 1 VIEW COMPARISON:  Chest radiograph from one day prior. FINDINGS: Endotracheal tube  tip is 2.0 cm above the carina. Enteric tube terminates in the proximal stomach. Stable cardiomediastinal silhouette with top-normal heart size. No pneumothorax. No pleural effusion. Mild platelike left retrocardiac atelectasis, similar. No pulmonary edema. No acute consolidative airspace disease. IMPRESSION: 1. Well-positioned support structures. 2. Mild platelike left retrocardiac atelectasis, similar. Electronically Signed   By: Delbert Phenix M.D.   On: 04/19/2018 09:09   Dg Abd Portable 1v  Result Date: 04/19/2018 CLINICAL DATA:  NG tube placement. EXAM: PORTABLE ABDOMEN - 1 VIEW COMPARISON:  None. FINDINGS: Tip and side port of the enteric tube below the diaphragm in the stomach. Visualized bowel gas pattern is normal. IMPRESSION: Tip and side port of the enteric tube below the diaphragm in the stomach. Electronically Signed   By: Narda Rutherford M.D.   On: 04/19/2018 22:49   Ir Percutaneous Art Thrombectomy/infusion Intracranial Inc Diag Angio  Result Date: 04/19/2018 INDICATION: Acute onset of right-sided hemiplegia, left gaze deviation, and aphasia. Occluded left middle cerebral artery M1 segment, and near complete occlusion of the left internal carotid artery at the bulb by CT angiogram of  the head and neck. EXAM: 1. EMERGENT LARGE VESSEL OCCLUSION THROMBOLYSIS (anterior CIRCULATION) COMPARISON:  CT angiogram of the head and neck of 04/17/2018. MEDICATIONS: Ancef 2 g IV antibiotic was administered within 1 hour of the procedure. ANESTHESIA/SEDATION: General anesthesia. CONTRAST:  Isovue 300 approximately 85 cc. FLUOROSCOPY TIME:  Fluoroscopy Time: 35 minutes 54 seconds (1959 mGy). COMPLICATIONS: None immediate. TECHNIQUE: Following a full explanation of the procedure along with the potential associated complications, an informed witnessed consent was obtained from the spouse. The risks of intracranial hemorrhage of 10%, worsening neurological deficit, ventilator dependency, death and inability to  revascularize were all reviewed in detail with the patient's spouse. The patient was then put under general anesthesia by the Department of Anesthesiology at [facility]. The right groin was prepped and draped in the usual sterile fashion. Thereafter using modified Seldinger technique, transfemoral access into the right common femoral artery was obtained without difficulty. Over a 0.035 inch guidewire a 5 French Pinnacle sheath was inserted. Through this, and also over a 0.035 inch guidewire a 5 [place] JB 1 catheter was advanced to the aortic arch region and selectively positioned in the left common carotid artery. FINDINGS: The left common carotid arteriogram demonstrates the left external carotid artery and its major branches to be widely patent. The left internal carotid artery just distal to the bulb has a severe irregular stenosis with near complete occlusion. This is associated with calcification along the lateral wall of the proximal left internal carotid artery. More distally, the vessel opacifies slowly to the cranial skull base. Wide patency is seen of the petrous and the cavernous segments. Complete angiographic occlusion was seen of the supraclinoid left ICA. Poor opacification is seen of the left posterior communicating artery and suggestion of trickle flow through the proximal left middle cerebral artery M1 segment. There is no opacification of the left anterior cerebral A1 segment. PROCEDURE: The diagnostic JB 1 catheter in the left common carotid artery was exchanged over a 0.035 inch 300 cm Rosen exchange guidewire for an 8 French 55 cm Brite tip neurovascular sheath. Good aspiration obtained from the side port of the neurovascular sheath. This was then connected to continuous heparinized saline infusion. Over the exchange guidewire, an 8 [place] 85 cm FlowGate balloon guide catheter which had been prepped with 50% contrast and 50% heparinized saline infusion was advanced and positioned just  proximal to the left common carotid bifurcation. The guidewire was removed. Good aspiration obtained from the hub of the [facility] guide catheter. Gentle control arteriogram demonstrated no evidence of spasms, dissections or of intraluminal filling defects. Also there continued be severe high-grade pre occlusive stenosis of the left internal carotid proximally. Over a 0.014 inch Softip Synchro micro guidewire, an 021 Trevo ProVue microcatheter was advanced to just distal to the [facility] guide catheter. The micro guidewire was then gently manipulated with a torque device and advanced through the nearly completely occluded left internal carotid artery at the bulb. The combination was navigated without difficulty to the horizontal petrous segment. The guidewire was removed. Good aspiration obtained from the tip of the microcatheter now in the petrous horizontal segment. This in turn was then exchanged for a 0.014 inch Softip Transend 300 cm exchange micro guidewire using biplane roadmap technique and constant fluoroscopic guidance. The micro guidewire had a J configuration to avoid dissections or inducing spasm. A control arteriogram performed through the [facility] guide catheter in the left common carotid artery demonstrated slow antegrade flow distally. A 4 mm x 30 mm  Viatrac 014 angioplasty balloon catheter which had been prepped with heparinized saline infusion was advanced using the rapid exchange technique and positioned adequate distance from the site of severe stenosis of the left internal carotid artery. Control angioplasty was then performed using a micro inflation syringe device via micro tubing where the balloon was inflated just above 8 atmospheres where it was maintained for about 30 seconds. Balloon was then deflated and retrieved proximally as the distal wire was maintained in the petrous horizontal segment. A control arteriogram performed through the [facility] guide catheter demonstrated significantly  improved caliber and flow through the left internal carotid artery proximally. Over the exchange 014 inch micro guidewire, a Trevo ProVue microcatheter inside of a 132 cm 6 [place] Catalyst guide catheter combination was advanced and positioned in the horizontal petrous segment. The guidewire was removed. Good aspiration obtained from the hub of the microcatheter. A control arteriogram was then performed through the 6 [place] Catalyst catheter which demonstrated angiographic occlusion of the supraclinoid left ICA with no flow in the left middle cerebral artery and slow flow in the anterior cerebral artery A1 segment with a filling defect. Over a 0.014 inch Softip Synchro micro guidewire, the combination of the microcatheter and the Catalyst guide catheter were advanced to the supraclinoid left ICA. The micro guidewire was then gently manipulated with a torque device and advanced through the occluded left middle cerebral artery into the dominant inferior division followed by the microcatheter. The Catalyst guide catheter was advanced to the supraclinoid left ICA. The micro guidewire was then retrieved and removed. Good aspiration obtained from the hub of the microcatheter. A gentle control arteriogram performed through the microcatheter demonstrated safe position of tip of the microcatheter. This was then connected to continuous heparinized saline infusion. A 5 mm x 33 mm Embotrap retrieval device was then advanced to the distal end of the microcatheter. At this time the Catalyst guide catheter was advanced into the proximal left M1 segment. The O ring on the delivery microcatheter was then loosened. With slight gentle forward traction with the right hand on the delivery micro guidewire, with the left hand the delivery microcatheter was retrieved unsheathing the distal and then the proximal portion of the retrieval device. A gentle control arteriogram performed through the 6 [place] Catalyst guide catheter in the  proximal left middle cerebral demonstrated complete angiographic nonvisualization. With proximal flow arrest of the Catalyst guide catheter in the mid left internal carotid artery cervical segment, the combination of the retrieval device, the microcatheter and the 6 [place] Catalyst guide catheter was retrieved as constant aspiration was applied at the hub of the [employer] guide catheter, and also at the hub of the Catalyst guide catheter using a Penumbra suction aspiration device. Complete lock down of the Penumbra device was noted with no aspirate being obtained while the combination was retrieved and removed. Aspiration was continued as proximal flow arrest was reversed in the left internal carotid artery. Copious amounts of clot were noted in the aspirate, and also in the [employer] [place] at the hub of the [facility] guide catheter, and also in the Penumbra aspiration collector device. A control arteriogram performed through the [facility] guide catheter in the left internal carotid artery demonstrated complete angiographic revascularization of the left middle cerebral artery and flash filling of the left anterior cerebral artery to complete distribution. No angiographic evidence of filling defects or of occlusions were seen in the left MCA or left anterior cerebral artery distributions. There was, however, severe  tapered narrowing due to spasm noted in the junction of the middle and the proximal 1/3 of the left internal carotid artery. The exchange 014 inch Transend EX micro guidewire was then advanced using biplane roadmap technique constant fluoroscopic guidance to the petrous segment of the left internal carotid artery through the [facility] guide catheter. With the micro guidewire stable in the petrous segment, the [facility] guide catheter was retrieved to just proximal to the left common carotid bifurcation. A control arteriogram performed at this site demonstrated continued significant narrowing of the  previously angioplastied site. It was decided to place a 6 mm/8 mm x 40 mm Xact stent across the previously symptomatic nearly completely occluded left internal carotid artery. Measurements had been performed of the left internal carotid artery just distal to the high-grade stenosis in the left internal carotid artery, and left common carotid artery proximally. The delivery portion of the stent was then retrogradely purged with heparinized saline infusion. Using the rapid exchange technique, the delivery apparatus with the stent was then advanced and positioned such that there was adequate coverage of the angioplastied segment distally and also proximally. Once established, this stent was then deployed without difficulty. With the exchange wire maintained distally, the delivery apparatus was retrieved and removed. A control arteriogram performed through the 8 [place] FlowGate guide catheter in the left common carotid artery demonstrated excellent apposition in the AP and lateral projections. No evidence of intraluminal filling defects were seen. A control arteriogram performed centered extra cranially and intracranially demonstrated significant spasm of the left internal carotid artery in the mid cervical portion. More distally, the left middle cerebral artery and the left anterior cerebral artery maintained complete revascularization without evidence of filling defects or of occlusions. Control arteriogram was then performed at 15, 30 and 40 minutes post deployment of the stent. Given the significantly improved caliber in the AP and lateral projections, a post stent angioplasty was not performed. During this time, the patient was given a total of 3 mg of intra-arterial Integrilin through the [facility] guide catheter in the left common carotid artery to ensure no development of intraluminal platelet aggregation. Prior to the final arteriogram, the exchange micro guidewire was retrieved and removed. There was  significant improvement in the spasm. Control arteriogram performed through the 8 [place] FlowGate guide catheter demonstrated excellent flow through the angioplastied segment with the stent in the proximal left internal carotid artery, with a TICI 3 revascularization achieved. Prior to the extracranial angioplasty of the high-grade proximal ICA stenosis, the patient was loaded with 180 mg of Brilinta, and 81 mg of aspirin via an orogastric tube. No angiographic evidence of intracranial extravasation, mass or midline shift was seen. The patient's neurological and hemodynamic status remained stable. The FlowGate guide catheter was then retrieved into the abdominal aorta as was the 8 French neurovascular sheath. Both of these were then exchanged for an 8 [place] Pinnacle sheath. This in turn was then removed with the successful hemostasis achieved at the right groin puncture site using an 8 French Angio-Seal closure device. The right groin appeared soft without evidence of hematoma or bleeding. Distal pulses remained palpable in the dorsalis pedis, and posterior tibial regions bilaterally at the end of the procedure. A Dyna CT of the brain revealed no evidence of intracranial hemorrhage, mass effect or midline shift. Patient was left intubated to prevent aspiration due to difficulty controlling secretions prior to intubation. The patient was then transferred to the neuro ICU to continue with further management. IMPRESSION: Status post  endovascular complete revascularization of occluded left internal carotid artery terminus, left middle cerebral artery M1 segment, and left anterior cerebral artery, with 1 pass with a 5 mm x 33 mm Embotrap retrieval device, and aspiration assistance with a Penumbra aspiration simultaneously as described achieving a TICI 3 revascularization. Status post endovascular revascularization of now complete occlusion of the symptomatic left internal carotid artery proximally with stent assisted  angioplasty. PLAN: Follow-up in clinic 4 weeks post discharge. Electronically Signed   By: Julieanne Cotton M.D.   On: 04/18/2018 10:47   Ct Head Code Stroke Wo Contrast`  Result Date: 04/17/2018 CLINICAL DATA:  Code stroke. 63 y/o F; transient episode of word-finding difficulty with recurrent symptoms including weakness of right arm and leg. EXAM: CT HEAD WITHOUT CONTRAST TECHNIQUE: Contiguous axial images were obtained from the base of the skull through the vertex without intravenous contrast. COMPARISON:  None. FINDINGS: Brain: Mild asymmetric hypoattenuation within the left lateral frontal lobe and anterior insula in comparison with the right which may represent acute stroke. ASPECTS is 8. No hemorrhage or mass effect. Several small chronic infarctions are present within the left parietal cortex. No extra-axial collection, hydrocephalus, or herniation. Vascular: Small density within left sylvian fissure may represent a MCA branch thrombus (series 6, image 11). Skull: Normal. Negative for fracture or focal lesion. Sinuses/Orbits: No acute finding. Other: None. ASPECTS [facility] Stroke Program Early CT Score) - Ganglionic level infarction (caudate, lentiform nuclei, internal capsule, insula, M1-M3 cortex): 5 - Supraganglionic infarction (M4-M6 cortex): 3 Total score (0-10 with 10 being normal): 8 IMPRESSION: 1. Mild asymmetric hypoattenuation within the left lateral frontal lobe and anterior insula in comparison with the right which may represent acute stroke. No hemorrhage or mass effect. 2. Small density within left Sylvian fissure, possible MCA branch thrombus. 3. ASPECTS is 8. 4. Several small chronic infarctions in left parietal cortex. These results were called by telephone at the time of interpretation on 04/17/2018 at 8:10 pm to Dr. Daryel November , who verbally acknowledged these results. Electronically Signed   By: Mitzi Hansen M.D.   On: 04/17/2018 20:12    Labs:  CBC: Recent Labs     04/17/18 1918 04/18/18 0454 04/19/18 0500 04/20/18 0615  WBC 9.9 12.9* 9.4 11.5*  HGB 14.1 13.1 10.6* 11.6*  HCT 43.9 40.6 33.4* 36.0  PLT 331 327 252 231    COAGS: Recent Labs    04/17/18 1918  INR 1.0  APTT 27    BMP: Recent Labs    04/17/18 1918 04/18/18 0454 04/19/18 0500 04/20/18 0615  NA 139 136 140 141  K 3.5 3.3* 3.1* 3.4*  CL 101 106 112* 110  CO2 27 23 22 23   GLUCOSE 163* 162* 115* 93  BUN 22 16 11 14   CALCIUM 8.9 8.3* 8.1* 8.5*  CREATININE 1.04* 0.89 0.86 0.96  GFRNONAA 58* >60 >60 >60  GFRAA >60 >60 >60 >60    LIVER FUNCTION TESTS: Recent Labs    09/22/17 1136 04/17/18 1918  BILITOT 0.3 0.6  AST 14 19  ALT 20 22  ALKPHOS 98 77  PROT 6.2 6.7  ALBUMIN 4.0 3.7    Assessment and Plan:  Left ICA, left MCA, and left ACA occlusions s/p emergent mechanical thrombectomy achieving a TICI 3 revascularization 04/17/2018 by Dr. Corliss Skains. Left ICA stenosis s/p revascularization using stent assisted angioplasty 04/17/2018 by Dr. Corliss Skains. Patient's condition stable- can spontaneously move all extremities with some weakness of RUE (can lift off bed against gravity but not fully  extend). Right groin incision stable. Continue taking Brilinta 90 mg twice daily and Aspirin 81 mg once daily. Plan to follow-up with Dr. Corliss Skains in clinic 4 weeks after discharge. Appreciate and agree with neurology and CCM management. IR to follow.   Electronically Signed: Elwin Mocha, PA-C 04/20/2018, 9:05 AM   I spent a total of 25 Minutes at the the patient's bedside AND on the patient's hospital floor or unit, greater than 50% of which was counseling/coordinating care for left ICA, left MCA and left ACA occlusions s/p revascularization.

## 2018-04-21 MED ORDER — IPRATROPIUM-ALBUTEROL 0.5-2.5 (3) MG/3ML IN SOLN
3.0000 mL | Freq: Two times a day (BID) | RESPIRATORY_TRACT | Status: DC
  Administered 2018-04-21 – 2018-04-23 (×4): 3 mL via RESPIRATORY_TRACT
  Filled 2018-04-21 (×4): qty 3

## 2018-04-21 NOTE — Progress Notes (Signed)
STROKE TEAM PROGRESS NOTE   INTERVAL HISTORY Her son and husband are at the bedside.    She is awake and following commands and moving the right side even better today. Blood pressure is adequately controlled. She has had therapy evaluation will recommend inpatient rehabilitation  Vitals:   04/21/18 1037 04/21/18 1100 04/21/18 1200 04/21/18 1400  BP: (!) 116/50 (!) 106/52 (!) 75/63 (!) 103/46  Pulse:  68 62 63  Resp:  (!) 30 (!) 25 (!) 30  Temp:   98.5 F (36.9 C)   TempSrc:   Oral   SpO2:  96% 98% 97%    CBC:  Recent Labs  Lab 04/18/18 0454 04/19/18 0500 04/20/18 0615  WBC 12.9* 9.4 11.5*  NEUTROABS 12.0*  --  9.8*  HGB 13.1 10.6* 11.6*  HCT 40.6 33.4* 36.0  MCV 88.3 90.8 90.0  PLT 327 252 128    Basic Metabolic Panel:  Recent Labs  Lab 04/19/18 0500 04/20/18 0615  NA 140 141  K 3.1* 3.4*  CL 112* 110  CO2 22 23  GLUCOSE 115* 93  BUN 11 14  CREATININE 0.86 0.96  CALCIUM 8.1* 8.5*  MG  --  1.9  PHOS  --  2.2*   Lipid Panel:     Component Value Date/Time   CHOL 186 04/18/2018 0454   CHOL 196 03/26/2017 1437   TRIG 90 04/18/2018 0454   TRIG 97 04/18/2018 0454   HDL 66 04/18/2018 0454   HDL 73 03/26/2017 1437   CHOLHDL 2.8 04/18/2018 0454   VLDL 18 04/18/2018 0454   LDLCALC 102 (H) 04/18/2018 0454   LDLCALC 107 (H) 03/26/2017 1437   HgbA1c:  Lab Results  Component Value Date   HGBA1C 5.7 (H) 04/18/2018   Urine Drug Screen: No results found for: LABOPIA, COCAINSCRNUR, LABBENZ, AMPHETMU, THCU, LABBARB  Alcohol Level     Component Value Date/Time   ETH <10 04/17/2018 1918    IMAGING Ct Head Code Stroke Wo Contrast` 04/17/2018 1. Mild asymmetric hypoattenuation within the left lateral frontal lobe and anterior insula in comparison with the right which may represent acute stroke. No hemorrhage or mass effect. 2. Small density within left Sylvian fissure, possible MCA branch thrombus. 3. ASPECTS is 8. 4. Several small chronic infarctions in left  parietal cortex.   CTA head 04/17/2018 1. Left M1 occlusion.  Poor left MCA distribution collateralization. 2. No additional intracranial large vessel occlusion, high-grade stenosis, aneurysm, or vascular malformation.   CTA neck 04/17/2018 1. Left proximal ICA thromboembolism. The ICA downstream to the thrombosis is opacified with contrast suggesting that the thrombus is near occlusive, but not totally occlusive. 2. Patent right carotid system and bilateral vertebral arteries without hemodynamically significant stenosis   Ct Cerebral Perfusion W Contrast 04/17/2018 Left MCA distribution large ischemic penumbra, 77 cc. Left frontal white matter and insula core infarct of 11 cc. ASPECTS 8 infarct on noncontrast CT of head with possible pseudonormalization of perfusion.   Cerebral angiogram S/P lt common carotid arteriogram foll;owed by endovascular complete revascularization of occluded lt mac,Lt ACA aand Lt ICA terminus with x 1 pass with 5 mm x 33 mm embotrap retriver device achieving a TICI 3 revascularization. S/P stent assisted angioplasty of  severely stenotic Lt ICA at the bulb . Patient loaded with 180 m8 of brilinta and 81 mg of aspirin . Also received 3 mg of IA integrelin.     MRI brain 04/18/2018 Patchy left MCA branch infarct.  Dg Chest 1 View 04/18/2018  1. Endotracheal tube tip just above the carina. Esophageal tube tip projects over the gastric fundal region and is directed cephalad near the GE junction 2. Streaky left perihilar and basilar atelectasis. Borderline cardiomegaly   Dg Chest 1 View 04/17/2018 No active disease.     PHYSICAL EXAM Obese middle-aged Caucasian lady who is not in distress . Afebrile. Head is nontraumatic. Neck is supple without bruit.    Cardiac exam no murmur or gallop. Lungs are clear to auscultation. Distal pulses are well felt. Neurological Exam :  She is awake and alert mild nonfluent aphasia  Follows commands very well. Pupils equal reactive.  Fundi not visualized.   Tongue is midline. Motor system exam shows right hemiparesis with right upper and lower extremity drift with grade  4/5 strength. Weakness of right grip and intrinsic hand muscles. Normal strength in the left side. Tone is diminished on the right. Deep tendon reflexes are depressed on the right compared to the left. Right plantar is upgoing left is downgoing.  ASSESSMENT/PLAN Ms. CHITARA CLONCH is a 63 y.o. female with history of asthma and HTN presenting to [facility] with dysphasia and R HP which occurred while talking about a traumatic event.  The event totally resolved.  While in the ED, she began having difficulty speaking again along with dense right hemiparesis.  She received tPA 04/17/2018 at 2032. CTA showed a dense LM1 occlusion with large ischemic penumbra on perfusion and CT a neck showed a left ICA embolus.  She was transferred to [facility] and sent to IR for mechanical thrombectomy where she had TICI 3 reperfusion using ambo trap and stent assisted angioplasty of severely stenotic L ICA at the bulb.  Stroke:  left MCA infarct s/p tPA and IR with ICA rescue stent placement, infarct likely d/t large vessel disease  extracranial carotid stenosis  Code Stroke CT head mild asymmetric hypoattenuation within the left lateral frontal lobe and anterior insula.  Small density left sylvian fissure possible MCA branch thrombus.  Several small chronic infarcts left parietal cortex.  ASPECTS 18    CTA head L M1 occlusion.  Poor L MCA distribution collateralization.  CTA neck left proximal ICA thromboembolism with downstream opacified digesting thrombus is near occlusive.  CT perfusion left MCA large ischemic penumbra 7 the 7 cc.  Left frontal white matter and insula core infarct 11 cc.  Angio occluded L MCA, L ACA and L ICA terminus with x 1 pass embotrap w/ TICI 3 revascularization. stent assisted angioplasty of  severely stenotic Lt ICA bulb . received 3 mg of IA  integrelin.  Post IR CT no ICH,mass effect or midline shift noted  24h CT head  Not done MRI  Multifocal acute LEFT MCA territory infarcts and anterior/posterior border zone infarcts with scattered petechial  hemorrhage.   2D Echo  Normal ejection fraction. No cardiac source of embolism.  LDL 102  HgbA1c 5.7  SCDs for VTE prophylaxis  No antithrombotic prior to admission, now on aspirin 81 mg daily and Brilinta (ticagrelor) 90 mg bid following load. Continue at d/c.  Therapy recommendations:  CLR  Disposition:  pending   Acute respiratory failure   Currently weaning  CCM following  Dysphagia   Secondary to stroke and or intubation   Currently n.p.o.   Assess swallow post extubation  Hypertensive emergency  Treated with Cleviprex   Home medications resumed   Blood pressure goal per post TPA protocol   wean Cleviprex as able  . Long-term BP goal  normotensive  Hyperlipidemia  Home meds: No statin  LDL 102, goal < 70  Add statin once able to swallow  Other Stroke Risk Factors  Obesity, There is no height or weight on file to calculate BMI., recommend weight loss, diet and exercise as appropriate   Family hx stroke (son x 2)  Other Active Problems  Hypokalemia 3.3, repeat in am  Hospital day # 4    She presented with aphasia and right hemiparesis due to left MCA infarct symptomatic from proximal left ICA stenosis and underwent acute revascularization requiring distal left carotid stent followed by successful mechanical thrombectomy and complete revascularization. Her neurological exam is showing improvement. Discussed with the patient's son at the bedside Plan transfer to neurology floor bed today. Mobilize out of bed. Rehabilitation consults. Greater than 50% time during this 35 minute visit was spent on counseling and coordination of care about her symptomatic carotid stenosis discussion about stroke, carotid stent and answering questions.     Antony Contras, MD Medical Director [facility] Stroke [facility] Pager: [phone] 04/21/2018 3:30 PM  To contact Stroke Continuity provider, please refer to [email or web address] After hours, contact General Neurology

## 2018-04-21 NOTE — Progress Notes (Signed)
Physical Therapy Treatment Patient Details Name: Kristin Solis MRN: [id number] DOB: 20-Jan-1956 Today's Date: 04/21/2018    History of Present Illness 63 yo admitted with dysphagia and right weakness from [facility] s/p tPA with Left ACA/ICA/MCA occlusion s/p thrombectomy 3/1. pt extubated 3/3. PMhx: HTN, asthma   Pt with excellent improvement with all mobility and able to walk and progress mobility this session. Pt continues to have RUE weakness and limited RLE weakness, decreased gait tolerance and balance in standing who would benefit from CIR to return to independence. Pt encouraged to be OOB to chair with nursing and begin toileting with BSC and bathroom rather than purewick. Pt fatigued end of session.   BP pre session 131/54, post session 116/50  PT Comments    RLE 4/5 hip flexion, knee flexion and extension, dorsiflexion 5/5  LLE 5/5 hip flexion, knee flexion and extension, dorsiflexion 5/5 Pt with inconsistent proprioceptive testing bil LE    Follow Up Recommendations  Supervision for mobility/OOB;CIR     Equipment Recommendations  Rolling walker with 5" wheels    Recommendations for Other Services       Precautions / Restrictions Precautions Precautions: Fall Precaution Comments: R sided weakness    Mobility  Bed Mobility Overal bed mobility: Needs Assistance Bed Mobility: Supine to Sit     Supine to sit: Supervision     General bed mobility comments: HOb 20 degrees pt able to pivot to EOB with supervision for lines  Transfers Overall transfer level: Needs assistance   Transfers: Sit to/from Stand Sit to Stand: Min assist         General transfer comment: guarding for standing from bed and min assist to control descent to surface as pt plopping end of session  Ambulation/Gait Ambulation/Gait assistance: Min assist Gait Distance (Feet): 150 Feet Assistive device: Rolling walker (2 wheeled) Gait Pattern/deviations: Step-through pattern;Decreased  dorsiflexion - right   Gait velocity interpretation: <1.8 ft/sec, indicate of risk for recurrent falls General Gait Details: cues for position in RW and Rt hand placement. Pt with tendency for rt hand to roll off walker and pt to be too near RW on right due to weakness. Standing rest x 3 with pt requring cues to attend to fatigue   Stairs             Wheelchair Mobility    Modified Rankin (Stroke Patients Only) Modified Rankin (Stroke Patients Only) Pre-Morbid Rankin Score: No symptoms Modified Rankin: Moderately severe disability     Balance Overall balance assessment: Needs assistance Sitting-balance support: No upper extremity supported;Feet supported Sitting balance-Leahy Scale: Good     Standing balance support: No upper extremity supported Standing balance-Leahy Scale: Fair Standing balance comment: pt able to stand end of sessin 40sec without UE assist                            Cognition Arousal/Alertness: Awake/alert Behavior During Therapy: WFL for tasks assessed/performed Overall Cognitive Status: Impaired/Different from baseline Area of Impairment: Problem solving;Safety/judgement                         Safety/Judgement: Decreased awareness of safety;Decreased awareness of deficits   Problem Solving: Requires verbal cues General Comments: decreased awareness of position in RW and RUe placement      Exercises      General Comments        Pertinent Vitals/Pain Pain Assessment: No/denies pain  Home Living                      Prior Function            PT Goals (current goals can now be found in the care plan section) Progress towards PT goals: Progressing toward goals    Frequency           PT Plan Current plan remains appropriate    Co-evaluation              AM-PAC PT "6 Clicks" Mobility   Outcome Measure  Help needed turning from your back to your side while in a flat bed without using  bedrails?: A Little Help needed moving from lying on your back to sitting on the side of a flat bed without using bedrails?: A Little Help needed moving to and from a bed to a chair (including a wheelchair)?: A Little Help needed standing up from a chair using your arms (e.g., wheelchair or bedside chair)?: A Little Help needed to walk in hospital room?: A Little Help needed climbing 3-5 steps with a railing? : A Lot 6 Click Score: 17    End of Session Equipment Utilized During Treatment: Gait belt Activity Tolerance: Patient tolerated treatment well Patient left: in chair;with call bell/phone within reach;with chair alarm set;with family/visitor present Nurse Communication: Mobility status;Precautions PT Visit Diagnosis: Other abnormalities of gait and mobility (R26.89);Muscle weakness (generalized) (M62.81);Other symptoms and signs involving the nervous system (R29.898)     Time: 6226-3335 PT Time Calculation (min) (ACUTE ONLY): 33 min  Charges:  $Gait Training: 8-22 mins $Therapeutic Activity: 8-22 mins                     Tedrick [place] Greenspan, PT Acute Rehabilitation Services Pager: [phone] Office: [phone]    Adriauna Campton B Tammela Bales 04/21/2018, 10:45 AM

## 2018-04-21 NOTE — Progress Notes (Signed)
Pt had small amount of bright red blood in stool. No external hemorrhoids seen.  MD notified. Orders given to continue monitoring for any more bleeding.

## 2018-04-21 NOTE — Progress Notes (Signed)
Inpatient Rehabilitation-Admissions Coordinator   Met with pt and her husband at the bedside. AC reviewed insurance benefits with them. Unfortunately, the pt's currently BCBS policy does not cover inpatient charges or admission. Due to potential financial burden AC has asked for financial counselor to assist with this case. AC awaiting call back for clarification regarding assistance.   Please call if questions.   Jhonnie Garner, OTR/L  Rehab Admissions Coordinator  [phone] 04/21/2018 2:48 PM

## 2018-04-21 NOTE — Progress Notes (Addendum)
  Speech Language Pathology Treatment: Dysphagia;Cognitive-Linquistic  Patient Details Name: Kristin Solis MRN: [id number] DOB: 1955/06/09 Today's Date: 04/21/2018 Time: 0925-1000 SLP Time Calculation (min) (ACUTE ONLY): 35 min  Assessment / Plan / Recommendation Clinical Impression  Pt seen for diet tolerance/advancement and aphasia tx focused on naming, auditory comprehension and speech intelligibility. Pt reported no difficulty with dys 1, pudding thick liquid breakfast this am. With chin tuck strategy to facilitate bolus control, pt tolerated nectar-thick liquids with no s/sx of aspiration. Throat clear x2 exhibited with trials of thin. Regular textured and mixed consistency solid consumed with difficulty due to absence of top dentures to facilitate mastication. Given MBS findings of residue and subsequent penetration with thin liquids, recommend dys 2, nectar-thick liquid diet with intermittent supervision to use chin tuck and throat clear strategies during liquid intake. SLP to f/u for safe diet advancement and use of strategies.   Upon SLP arrival, pt's speech intelligiblity much improved from what was described in SLP evaluation. She was 90-100% intellgible at the conversation level and demonstrated min- no word finding difficulty. She achieved 95% accuracy during confrontational naming task and benefited from semantic and phonemic cues. Paragraph level auditory comprehension task with higher level sequential questions proved too difficult for pt at this time. Plan for SLP f/u to address responsive naming and paragraph level auditory comprehension using yes/no questions.   HPI HPI: Pt presented to the ED following acute onset of dysphasia and right hemiparesis. However, on arrival her speech was fluent and she was able to relate to ED staff that she had been trying to tell her husband that a friend of hers daughter had committed suicide last night but she couldn't "get the words out." She  further stated that she couldn't find her keys and couldn't "make a sentence" and her husband reported that she was also "slobbering all over herself" at that time. Code stroke was cancelled but subsequently reactivated when she again demonstrated difficulty speaking and dense right hemiparesis. CT head showed mild asymmetric hypoattenuation within the left lateral frontal lobe and anterior insula in comparison with the right which was felt possibly to represent acute stroke. Also noted was a small density within left Sylvian fissure, possible MCA branch thrombus. IV tPA was started and CTA of the neck showed: Left proximal ICA thromboembolism. The ICA downstream to the thrombosis is opacified with contrast suggesting that the thrombus is near occlusive, but not totally occlusive. She underwent  lt common carotid arteriogram followed by complete endovascular revascularization by IR and was intubated by anesthesia at 22:38 on 04/17/2018 with extubation on 04/19/18 at 0910.       SLP Plan  Continue with current plan of care       Recommendations  Diet recommendations: Dysphagia 2 (fine chop);Nectar-thick liquid Liquids provided via: Straw;Cup Medication Administration: Whole meds with liquid Supervision: Intermittent supervision to cue for compensatory strategies;Patient able to self feed Compensations: Slow rate;Small sips/bites;Chin tuck;Use straw to facilitate chin tuck Postural Changes and/or Swallow Maneuvers: Seated upright 90 degrees;Upright 30-60 min after meal;Chin tuck                Oral Care Recommendations: Oral care BID Follow up Recommendations: Inpatient Rehab SLP Visit Diagnosis: Dysphagia, pharyngeal phase (R13.13) Plan: Continue with current plan of care       GO                Gardiner Ramus, Student SLP 04/21/2018, 11:07 AM

## 2018-04-21 NOTE — Progress Notes (Signed)
Referring Physician(s): CODE STROKE- Caryl Pina  Supervising Physician: Julieanne Cotton  Patient Status:  [name] - In-pt  Chief Complaint: Left ICA, left MCA, and left ACA occlusions s/p emergent mechanical thrombectomy achieving a TICI 3 revascularization 04/17/2018 by Dr. Corliss Skains. Left ICA stenosis s/p revascularization using stent assisted angioplasty 04/17/2018 by Dr. Corliss Skains.  Subjective: Raises right arm overhead upon provider entering room.  Speech much improved.  Family at bedside.  Plans for rehab soon.   Allergies: Patient has no known allergies.  Medications: Prior to Admission medications   Medication Sig Start Date End Date Taking? Authorizing Provider  albuterol (PROVENTIL HFA;VENTOLIN HFA) 108 (90 Base) MCG/ACT inhaler Inhale 2 puffs into the lungs every 6 (six) hours as needed for wheezing or shortness of breath. 03/30/18  Yes Particia Nearing, PA-C  fluticasone [facility]) 50 MCG/ACT nasal spray Place 2 sprays into both nostrils daily. 03/30/18  Yes Particia Nearing, PA-C  Fluticasone-Salmeterol (ADVAIR DISKUS) 250-50 MCG/DOSE AEPB Inhale 1 puff into the lungs 2 (two) times daily. 03/30/18  Yes Particia Nearing, PA-C  lisinopril-hydrochlorothiazide (PRINZIDE,ZESTORETIC) 10-12.5 MG tablet Take 1 tablet by mouth daily. 03/30/18  Yes Particia Nearing, PA-C  predniSONE (DELTASONE) 50 MG tablet Take 1 tablet (50 mg total) by mouth daily with breakfast. 04/11/18  Yes Johnson, Megan P, DO  ibuprofen (ADVIL,MOTRIN) 800 MG tablet Take 1 tablet (800 mg total) by mouth every 8 (eight) hours as needed. Patient not taking: Reported on 04/18/2018 10/22/16   Little, Traci M, PA-C  montelukast (SINGULAIR) 10 MG tablet Take 1 tablet (10 mg total) by mouth at bedtime. Patient not taking: Reported on 04/18/2018 03/30/18   Particia Nearing, PA-C     Vital Signs: BP (!) 116/50   Pulse 65   Temp 97.8 F (36.6 C) (Oral)   Resp (!) 28   SpO2 100%   Physical  Exam Vitals signs and nursing note reviewed.  Constitutional:      General: She is not in acute distress.    Appearance: Normal appearance.  Pulmonary:     Effort: Pulmonary effort is normal. No respiratory distress.  Skin:    General: Skin is warm and dry.     Comments: Right groin incision soft without active bleeding or hematoma.  Neurological:     Mental Status: She is alert.     Comments: Alert, awake, and oriented x3. Speech and comprehension intact. PERRL bilaterally. EOMs intact. Spontaneously moves all extremities. Moving right side. Overall improvement in movement and strength     Imaging: Ct Angio Head W Or Wo Contrast  Result Date: 04/17/2018 EXAM: CT ANGIOGRAPHY HEAD AND NECK CT PERFUSION BRAIN TECHNIQUE: Multidetector CT imaging of the head and neck was performed using the standard protocol during bolus administration of intravenous contrast. Multiplanar CT image reconstructions and MIPs were obtained to evaluate the vascular anatomy. Carotid stenosis measurements (when applicable) are obtained utilizing NASCET criteria, using the distal internal carotid diameter as the denominator. Multiphase CT imaging of the brain was performed following IV bolus contrast injection. Subsequent parametric perfusion maps were calculated using RAPID software. CONTRAST:  ISOVUE-370 IOPAMIDOL (ISOVUE-370) INJECTION 76% COMPARISON:  None. FINDINGS: CTA NECK FINDINGS Aortic arch: Standard branching. Imaged portion shows no evidence of aneurysm or dissection. No significant stenosis of the major arch vessel origins. Right carotid system: No evidence of dissection, stenosis (50% or greater) or occlusion. Non stenotic calcific atherosclerosis of the carotid bifurcation. Left carotid system: Left proximal ICA thromboembolism. The ICA downstream  to the thrombosis is opacified with contrast suggesting that the thrombus is near occlusive but not totally occlusive. Vertebral arteries: Left dominant. No  evidence of dissection, stenosis (50% or greater) or occlusion. Skeleton: No acute finding. Other neck: 7 mm nodule within the left lobe of the thyroid gland. Upper chest: Negative. Review of the MIP images confirms the above findings CTA HEAD FINDINGS Anterior circulation: Non stenotic calcific atherosclerosis of the carotid siphons. Large right A1, large anterior communicating artery, diminutive left A1, normal variant. Patent bilateral ACA and right MCA distributions. Left M1 occlusion with poor left MCA distribution collateralization. Posterior circulation: No significant stenosis, proximal occlusion, aneurysm, or vascular malformation. Venous sinuses: As permitted by contrast timing, patent. Anatomic variants: None significant. Review of the MIP images confirms the above findings CT Brain Perfusion Findings: CBF (<30%) Volume: 11mL Perfusion (Tmax>6.0s) volume: 77mL Mismatch Volume: 66mL Infarction Location:Left MCA distribution, ASPECTS 8 stroke on noncontrast CT. IMPRESSION: CT brain perfusion: Left MCA distribution large ischemic penumbra, 77 cc. Left frontal white matter and insula core infarct of 11 cc. ASPECTS 8 infarct on noncontrast CT of head with possible pseudonormalization of perfusion. CTA head: 1. Left M1 occlusion.  Poor left MCA distribution collateralization. 2. No additional intracranial large vessel occlusion, high-grade stenosis, aneurysm, or vascular malformation. CTA neck: 1. Left proximal ICA thromboembolism. The ICA downstream to the thrombosis is opacified with contrast suggesting that the thrombus is near occlusive, but not totally occlusive. 2. Patent right carotid system and bilateral vertebral arteries without hemodynamically significant stenosis by NASCET criteria, dissection, or aneurysm. These results were called by telephone at the time of interpretation on 04/17/2018 at 9:24 pm to Dr. Daryel November , who verbally acknowledged these results. Electronically Signed   By: Mitzi Hansen M.D.   On: 04/17/2018 21:35   Dg Chest 1 View  Result Date: 04/18/2018 CLINICAL DATA:  Post intubation EXAM: CHEST  1 VIEW COMPARISON:  04/17/2018 FINDINGS: Interval intubation, tip of the endotracheal tube is just above the carina. Esophageal tube tip overlies the gastric fundus and projects near GE junction. Streaky atelectasis at the left base. Borderline cardiomegaly. No pneumothorax. Contrast material within the renal collecting systems. IMPRESSION: 1. Endotracheal tube tip just above the carina. Esophageal tube tip projects over the gastric fundal region and is directed cephalad near the GE junction 2. Streaky left perihilar and basilar atelectasis. Borderline cardiomegaly Electronically Signed   By: Jasmine Pang M.D.   On: 04/18/2018 03:56   Dg Chest 1 View  Result Date: 04/17/2018 CLINICAL DATA:  Right-sided weakness.  Asthma and hypertension EXAM: CHEST  1 VIEW COMPARISON:  None. FINDINGS: The heart size and mediastinal contours are within normal limits. Both lungs are clear. The visualized skeletal structures are unremarkable. IMPRESSION: No active disease. Electronically Signed   By: Signa Kell M.D.   On: 04/17/2018 21:58   Ct Angio Neck W Or Wo Contrast  Result Date: 04/17/2018 EXAM: CT ANGIOGRAPHY HEAD AND NECK CT PERFUSION BRAIN TECHNIQUE: Multidetector CT imaging of the head and neck was performed using the standard protocol during bolus administration of intravenous contrast. Multiplanar CT image reconstructions and MIPs were obtained to evaluate the vascular anatomy. Carotid stenosis measurements (when applicable) are obtained utilizing NASCET criteria, using the distal internal carotid diameter as the denominator. Multiphase CT imaging of the brain was performed following IV bolus contrast injection. Subsequent parametric perfusion maps were calculated using RAPID software. CONTRAST:  ISOVUE-370 IOPAMIDOL (ISOVUE-370) INJECTION 76% COMPARISON:  None. FINDINGS:  CTA  NECK FINDINGS Aortic arch: Standard branching. Imaged portion shows no evidence of aneurysm or dissection. No significant stenosis of the major arch vessel origins. Right carotid system: No evidence of dissection, stenosis (50% or greater) or occlusion. Non stenotic calcific atherosclerosis of the carotid bifurcation. Left carotid system: Left proximal ICA thromboembolism. The ICA downstream to the thrombosis is opacified with contrast suggesting that the thrombus is near occlusive but not totally occlusive. Vertebral arteries: Left dominant. No evidence of dissection, stenosis (50% or greater) or occlusion. Skeleton: No acute finding. Other neck: 7 mm nodule within the left lobe of the thyroid gland. Upper chest: Negative. Review of the MIP images confirms the above findings CTA HEAD FINDINGS Anterior circulation: Non stenotic calcific atherosclerosis of the carotid siphons. Large right A1, large anterior communicating artery, diminutive left A1, normal variant. Patent bilateral ACA and right MCA distributions. Left M1 occlusion with poor left MCA distribution collateralization. Posterior circulation: No significant stenosis, proximal occlusion, aneurysm, or vascular malformation. Venous sinuses: As permitted by contrast timing, patent. Anatomic variants: None significant. Review of the MIP images confirms the above findings CT Brain Perfusion Findings: CBF (<30%) Volume: 11mL Perfusion (Tmax>6.0s) volume: 77mL Mismatch Volume: 66mL Infarction Location:Left MCA distribution, ASPECTS 8 stroke on noncontrast CT. IMPRESSION: CT brain perfusion: Left MCA distribution large ischemic penumbra, 77 cc. Left frontal white matter and insula core infarct of 11 cc. ASPECTS 8 infarct on noncontrast CT of head with possible pseudonormalization of perfusion. CTA head: 1. Left M1 occlusion.  Poor left MCA distribution collateralization. 2. No additional intracranial large vessel occlusion, high-grade stenosis, aneurysm, or  vascular malformation. CTA neck: 1. Left proximal ICA thromboembolism. The ICA downstream to the thrombosis is opacified with contrast suggesting that the thrombus is near occlusive, but not totally occlusive. 2. Patent right carotid system and bilateral vertebral arteries without hemodynamically significant stenosis by NASCET criteria, dissection, or aneurysm. These results were called by telephone at the time of interpretation on 04/17/2018 at 9:24 pm to Dr. Daryel November , who verbally acknowledged these results. Electronically Signed   By: Mitzi Hansen M.D.   On: 04/17/2018 21:35   Mr Brain Wo Contrast  Result Date: 04/18/2018 CLINICAL DATA:  Status post endovascular revascularization of stenotic LEFT ICA. History of hypertension. EXAM: MRI HEAD WITHOUT CONTRAST TECHNIQUE: Multiplanar, multiecho pulse sequences of the brain and surrounding structures were obtained without intravenous contrast. COMPARISON:  None. FINDINGS: INTRACRANIAL CONTENTS: Confluent LEFT frontotemporal, operculum and insula, LEFT basal ganglia reduced diffusion. Patchy areas of reduced diffusion LEFT frontal convexity, LEFT parietal lobe. Areas of reduced diffusion demonstrate low ADC values; microhemorrhages within several areas of acute infarcts. Scattered subcentimeter supratentorial white matter FLAIR T2 hyperintensities exclusive aforementioned abnormality compatible with mild chronic small vessel ischemic changes. No parenchymal brain volume loss for age. No midline shift. No abnormal extra-axial fluid collections. VASCULAR: Normal major intracranial vascular flow voids present at skull base. SKULL AND UPPER CERVICAL SPINE: No abnormal sellar expansion. No suspicious calvarial bone marrow signal. Small subgaleal fluid collections. Craniocervical junction maintained. SINUSES/ORBITS: Moderate paranasal sinusitis with air-fluid levels. Mastoid air cells are well aerated.The included ocular globes and orbital contents are  non-suspicious. OTHER: Life support lines in place. IMPRESSION: 1. Multifocal acute LEFT MCA territory infarcts and anterior/posterior border zone infarcts with scattered petechial hemorrhage. Electronically Signed   By: Awilda Metro M.D.   On: 04/18/2018 19:30   Ir Delsa Sale Stent Cerv Carotid W/o Emb-prot Mod Sed  Result Date: 04/19/2018 INDICATION: Acute onset of right-sided hemiplegia, left gaze  deviation, and aphasia. Occluded left middle cerebral artery M1 segment, and near complete occlusion of the left internal carotid artery at the bulb by CT angiogram of the head and neck. EXAM: 1. EMERGENT LARGE VESSEL OCCLUSION THROMBOLYSIS (anterior CIRCULATION) COMPARISON:  CT angiogram of the head and neck of 04/17/2018. MEDICATIONS: Ancef 2 g IV antibiotic was administered within 1 hour of the procedure. ANESTHESIA/SEDATION: General anesthesia. CONTRAST:  Isovue 300 approximately 85 cc. FLUOROSCOPY TIME:  Fluoroscopy Time: 35 minutes 54 seconds (1959 mGy). COMPLICATIONS: None immediate. TECHNIQUE: Following a full explanation of the procedure along with the potential associated complications, an informed witnessed consent was obtained from the spouse. The risks of intracranial hemorrhage of 10%, worsening neurological deficit, ventilator dependency, death and inability to revascularize were all reviewed in detail with the patient's spouse. The patient was then put under general anesthesia by the Department of Anesthesiology at [facility]. The right groin was prepped and draped in the usual sterile fashion. Thereafter using modified Seldinger technique, transfemoral access into the right common femoral artery was obtained without difficulty. Over a 0.035 inch guidewire a 5 French Pinnacle sheath was inserted. Through this, and also over a 0.035 inch guidewire a 5 [place] JB 1 catheter was advanced to the aortic arch region and selectively positioned in the left common carotid artery. FINDINGS: The left  common carotid arteriogram demonstrates the left external carotid artery and its major branches to be widely patent. The left internal carotid artery just distal to the bulb has a severe irregular stenosis with near complete occlusion. This is associated with calcification along the lateral wall of the proximal left internal carotid artery. More distally, the vessel opacifies slowly to the cranial skull base. Wide patency is seen of the petrous and the cavernous segments. Complete angiographic occlusion was seen of the supraclinoid left ICA. Poor opacification is seen of the left posterior communicating artery and suggestion of trickle flow through the proximal left middle cerebral artery M1 segment. There is no opacification of the left anterior cerebral A1 segment. PROCEDURE: The diagnostic JB 1 catheter in the left common carotid artery was exchanged over a 0.035 inch 300 cm Rosen exchange guidewire for an 8 French 55 cm Brite tip neurovascular sheath. Good aspiration obtained from the side port of the neurovascular sheath. This was then connected to continuous heparinized saline infusion. Over the exchange guidewire, an 8 [place] 85 cm FlowGate balloon guide catheter which had been prepped with 50% contrast and 50% heparinized saline infusion was advanced and positioned just proximal to the left common carotid bifurcation. The guidewire was removed. Good aspiration obtained from the hub of the [facility] guide catheter. Gentle control arteriogram demonstrated no evidence of spasms, dissections or of intraluminal filling defects. Also there continued be severe high-grade pre occlusive stenosis of the left internal carotid proximally. Over a 0.014 inch Softip Synchro micro guidewire, an 021 Trevo ProVue microcatheter was advanced to just distal to the [facility] guide catheter. The micro guidewire was then gently manipulated with a torque device and advanced through the nearly completely occluded left internal carotid  artery at the bulb. The combination was navigated without difficulty to the horizontal petrous segment. The guidewire was removed. Good aspiration obtained from the tip of the microcatheter now in the petrous horizontal segment. This in turn was then exchanged for a 0.014 inch Softip Transend 300 cm exchange micro guidewire using biplane roadmap technique and constant fluoroscopic guidance. The micro guidewire had a J configuration to avoid dissections or inducing  spasm. A control arteriogram performed through the [facility] guide catheter in the left common carotid artery demonstrated slow antegrade flow distally. A 4 mm x 30 mm Viatrac 014 angioplasty balloon catheter which had been prepped with heparinized saline infusion was advanced using the rapid exchange technique and positioned adequate distance from the site of severe stenosis of the left internal carotid artery. Control angioplasty was then performed using a micro inflation syringe device via micro tubing where the balloon was inflated just above 8 atmospheres where it was maintained for about 30 seconds. Balloon was then deflated and retrieved proximally as the distal wire was maintained in the petrous horizontal segment. A control arteriogram performed through the [facility] guide catheter demonstrated significantly improved caliber and flow through the left internal carotid artery proximally. Over the exchange 014 inch micro guidewire, a Trevo ProVue microcatheter inside of a 132 cm 6 [place] Catalyst guide catheter combination was advanced and positioned in the horizontal petrous segment. The guidewire was removed. Good aspiration obtained from the hub of the microcatheter. A control arteriogram was then performed through the 6 [place] Catalyst catheter which demonstrated angiographic occlusion of the supraclinoid left ICA with no flow in the left middle cerebral artery and slow flow in the anterior cerebral artery A1 segment with a filling defect. Over a  0.014 inch Softip Synchro micro guidewire, the combination of the microcatheter and the Catalyst guide catheter were advanced to the supraclinoid left ICA. The micro guidewire was then gently manipulated with a torque device and advanced through the occluded left middle cerebral artery into the dominant inferior division followed by the microcatheter. The Catalyst guide catheter was advanced to the supraclinoid left ICA. The micro guidewire was then retrieved and removed. Good aspiration obtained from the hub of the microcatheter. A gentle control arteriogram performed through the microcatheter demonstrated safe position of tip of the microcatheter. This was then connected to continuous heparinized saline infusion. A 5 mm x 33 mm Embotrap retrieval device was then advanced to the distal end of the microcatheter. At this time the Catalyst guide catheter was advanced into the proximal left M1 segment. The O ring on the delivery microcatheter was then loosened. With slight gentle forward traction with the right hand on the delivery micro guidewire, with the left hand the delivery microcatheter was retrieved unsheathing the distal and then the proximal portion of the retrieval device. A gentle control arteriogram performed through the 6 [place] Catalyst guide catheter in the proximal left middle cerebral demonstrated complete angiographic nonvisualization. With proximal flow arrest of the Catalyst guide catheter in the mid left internal carotid artery cervical segment, the combination of the retrieval device, the microcatheter and the 6 [place] Catalyst guide catheter was retrieved as constant aspiration was applied at the hub of the 8 [employer] guide catheter, and also at the hub of the Catalyst guide catheter using a Penumbra suction aspiration device. Complete lock down of the Penumbra device was noted with no aspirate being obtained while the combination was retrieved and removed. Aspiration was continued as  proximal flow arrest was reversed in the left internal carotid artery. Copious amounts of clot were noted in the aspirate, and also in the [employer] [place] at the hub of the [facility] guide catheter, and also in the Penumbra aspiration collector device. A control arteriogram performed through the [facility] guide catheter in the left internal carotid artery demonstrated complete angiographic revascularization of the left middle cerebral artery and flash filling of the left anterior cerebral artery to  complete distribution. No angiographic evidence of filling defects or of occlusions were seen in the left MCA or left anterior cerebral artery distributions. There was, however, severe tapered narrowing due to spasm noted in the junction of the middle and the proximal 1/3 of the left internal carotid artery. The exchange 014 inch Transend EX micro guidewire was then advanced using biplane roadmap technique constant fluoroscopic guidance to the petrous segment of the left internal carotid artery through the [facility] guide catheter. With the micro guidewire stable in the petrous segment, the [facility] guide catheter was retrieved to just proximal to the left common carotid bifurcation. A control arteriogram performed at this site demonstrated continued significant narrowing of the previously angioplastied site. It was decided to place a 6 mm/8 mm x 40 mm Xact stent across the previously symptomatic nearly completely occluded left internal carotid artery. Measurements had been performed of the left internal carotid artery just distal to the high-grade stenosis in the left internal carotid artery, and left common carotid artery proximally. The delivery portion of the stent was then retrogradely purged with heparinized saline infusion. Using the rapid exchange technique, the delivery apparatus with the stent was then advanced and positioned such that there was adequate coverage of the angioplastied segment distally and also  proximally. Once established, this stent was then deployed without difficulty. With the exchange wire maintained distally, the delivery apparatus was retrieved and removed. A control arteriogram performed through the 8 [place] FlowGate guide catheter in the left common carotid artery demonstrated excellent apposition in the AP and lateral projections. No evidence of intraluminal filling defects were seen. A control arteriogram performed centered extra cranially and intracranially demonstrated significant spasm of the left internal carotid artery in the mid cervical portion. More distally, the left middle cerebral artery and the left anterior cerebral artery maintained complete revascularization without evidence of filling defects or of occlusions. Control arteriogram was then performed at 15, 30 and 40 minutes post deployment of the stent. Given the significantly improved caliber in the AP and lateral projections, a post stent angioplasty was not performed. During this time, the patient was given a total of 3 mg of intra-arterial Integrilin through the [facility] guide catheter in the left common carotid artery to ensure no development of intraluminal platelet aggregation. Prior to the final arteriogram, the exchange micro guidewire was retrieved and removed. There was significant improvement in the spasm. Control arteriogram performed through the 8 [place] FlowGate guide catheter demonstrated excellent flow through the angioplastied segment with the stent in the proximal left internal carotid artery, with a TICI 3 revascularization achieved. Prior to the extracranial angioplasty of the high-grade proximal ICA stenosis, the patient was loaded with 180 mg of Brilinta, and 81 mg of aspirin via an orogastric tube. No angiographic evidence of intracranial extravasation, mass or midline shift was seen. The patient's neurological and hemodynamic status remained stable. The FlowGate guide catheter was then retrieved into the  abdominal aorta as was the 8 French neurovascular sheath. Both of these were then exchanged for an 8 [place] Pinnacle sheath. This in turn was then removed with the successful hemostasis achieved at the right groin puncture site using an 8 French Angio-Seal closure device. The right groin appeared soft without evidence of hematoma or bleeding. Distal pulses remained palpable in the dorsalis pedis, and posterior tibial regions bilaterally at the end of the procedure. A Dyna CT of the brain revealed no evidence of intracranial hemorrhage, mass effect or midline shift. Patient was left intubated to  prevent aspiration due to difficulty controlling secretions prior to intubation. The patient was then transferred to the neuro ICU to continue with further management. IMPRESSION: Status post endovascular complete revascularization of occluded left internal carotid artery terminus, left middle cerebral artery M1 segment, and left anterior cerebral artery, with 1 pass with a 5 mm x 33 mm Embotrap retrieval device, and aspiration assistance with a Penumbra aspiration simultaneously as described achieving a TICI 3 revascularization. Status post endovascular revascularization of now complete occlusion of the symptomatic left internal carotid artery proximally with stent assisted angioplasty. PLAN: Follow-up in clinic 4 weeks post discharge. Electronically Signed   By: Julieanne Cotton M.D.   On: 04/18/2018 10:47   Ir Ct Head Ltd  Result Date: 04/19/2018 INDICATION: Acute onset of right-sided hemiplegia, left gaze deviation, and aphasia. Occluded left middle cerebral artery M1 segment, and near complete occlusion of the left internal carotid artery at the bulb by CT angiogram of the head and neck. EXAM: 1. EMERGENT LARGE VESSEL OCCLUSION THROMBOLYSIS (anterior CIRCULATION) COMPARISON:  CT angiogram of the head and neck of 04/17/2018. MEDICATIONS: Ancef 2 g IV antibiotic was administered within 1 hour of the procedure.  ANESTHESIA/SEDATION: General anesthesia. CONTRAST:  Isovue 300 approximately 85 cc. FLUOROSCOPY TIME:  Fluoroscopy Time: 35 minutes 54 seconds (1959 mGy). COMPLICATIONS: None immediate. TECHNIQUE: Following a full explanation of the procedure along with the potential associated complications, an informed witnessed consent was obtained from the spouse. The risks of intracranial hemorrhage of 10%, worsening neurological deficit, ventilator dependency, death and inability to revascularize were all reviewed in detail with the patient's spouse. The patient was then put under general anesthesia by the Department of Anesthesiology at [facility]. The right groin was prepped and draped in the usual sterile fashion. Thereafter using modified Seldinger technique, transfemoral access into the right common femoral artery was obtained without difficulty. Over a 0.035 inch guidewire a 5 French Pinnacle sheath was inserted. Through this, and also over a 0.035 inch guidewire a 5 [place] JB 1 catheter was advanced to the aortic arch region and selectively positioned in the left common carotid artery. FINDINGS: The left common carotid arteriogram demonstrates the left external carotid artery and its major branches to be widely patent. The left internal carotid artery just distal to the bulb has a severe irregular stenosis with near complete occlusion. This is associated with calcification along the lateral wall of the proximal left internal carotid artery. More distally, the vessel opacifies slowly to the cranial skull base. Wide patency is seen of the petrous and the cavernous segments. Complete angiographic occlusion was seen of the supraclinoid left ICA. Poor opacification is seen of the left posterior communicating artery and suggestion of trickle flow through the proximal left middle cerebral artery M1 segment. There is no opacification of the left anterior cerebral A1 segment. PROCEDURE: The diagnostic JB 1 catheter in  the left common carotid artery was exchanged over a 0.035 inch 300 cm Rosen exchange guidewire for an 8 French 55 cm Brite tip neurovascular sheath. Good aspiration obtained from the side port of the neurovascular sheath. This was then connected to continuous heparinized saline infusion. Over the exchange guidewire, an 8 [place] 85 cm FlowGate balloon guide catheter which had been prepped with 50% contrast and 50% heparinized saline infusion was advanced and positioned just proximal to the left common carotid bifurcation. The guidewire was removed. Good aspiration obtained from the hub of the [facility] guide catheter. Gentle control arteriogram demonstrated no evidence of spasms,  dissections or of intraluminal filling defects. Also there continued be severe high-grade pre occlusive stenosis of the left internal carotid proximally. Over a 0.014 inch Softip Synchro micro guidewire, an 021 Trevo ProVue microcatheter was advanced to just distal to the [facility] guide catheter. The micro guidewire was then gently manipulated with a torque device and advanced through the nearly completely occluded left internal carotid artery at the bulb. The combination was navigated without difficulty to the horizontal petrous segment. The guidewire was removed. Good aspiration obtained from the tip of the microcatheter now in the petrous horizontal segment. This in turn was then exchanged for a 0.014 inch Softip Transend 300 cm exchange micro guidewire using biplane roadmap technique and constant fluoroscopic guidance. The micro guidewire had a J configuration to avoid dissections or inducing spasm. A control arteriogram performed through the [facility] guide catheter in the left common carotid artery demonstrated slow antegrade flow distally. A 4 mm x 30 mm Viatrac 014 angioplasty balloon catheter which had been prepped with heparinized saline infusion was advanced using the rapid exchange technique and positioned adequate distance from  the site of severe stenosis of the left internal carotid artery. Control angioplasty was then performed using a micro inflation syringe device via micro tubing where the balloon was inflated just above 8 atmospheres where it was maintained for about 30 seconds. Balloon was then deflated and retrieved proximally as the distal wire was maintained in the petrous horizontal segment. A control arteriogram performed through the [facility] guide catheter demonstrated significantly improved caliber and flow through the left internal carotid artery proximally. Over the exchange 014 inch micro guidewire, a Trevo ProVue microcatheter inside of a 132 cm 6 [place] Catalyst guide catheter combination was advanced and positioned in the horizontal petrous segment. The guidewire was removed. Good aspiration obtained from the hub of the microcatheter. A control arteriogram was then performed through the 6 [place] Catalyst catheter which demonstrated angiographic occlusion of the supraclinoid left ICA with no flow in the left middle cerebral artery and slow flow in the anterior cerebral artery A1 segment with a filling defect. Over a 0.014 inch Softip Synchro micro guidewire, the combination of the microcatheter and the Catalyst guide catheter were advanced to the supraclinoid left ICA. The micro guidewire was then gently manipulated with a torque device and advanced through the occluded left middle cerebral artery into the dominant inferior division followed by the microcatheter. The Catalyst guide catheter was advanced to the supraclinoid left ICA. The micro guidewire was then retrieved and removed. Good aspiration obtained from the hub of the microcatheter. A gentle control arteriogram performed through the microcatheter demonstrated safe position of tip of the microcatheter. This was then connected to continuous heparinized saline infusion. A 5 mm x 33 mm Embotrap retrieval device was then advanced to the distal end of the  microcatheter. At this time the Catalyst guide catheter was advanced into the proximal left M1 segment. The O ring on the delivery microcatheter was then loosened. With slight gentle forward traction with the right hand on the delivery micro guidewire, with the left hand the delivery microcatheter was retrieved unsheathing the distal and then the proximal portion of the retrieval device. A gentle control arteriogram performed through the 6 [place] Catalyst guide catheter in the proximal left middle cerebral demonstrated complete angiographic nonvisualization. With proximal flow arrest of the Catalyst guide catheter in the mid left internal carotid artery cervical segment, the combination of the retrieval device, the microcatheter and the 6 [place] Catalyst guide  catheter was retrieved as constant aspiration was applied at the hub of the 8 [place] FlowGate guide catheter, and also at the hub of the Catalyst guide catheter using a Penumbra suction aspiration device. Complete lock down of the Penumbra device was noted with no aspirate being obtained while the combination was retrieved and removed. Aspiration was continued as proximal flow arrest was reversed in the left internal carotid artery. Copious amounts of clot were noted in the aspirate, and also in the Tuohy Samak at the hub of the [facility] guide catheter, and also in the Penumbra aspiration collector device. A control arteriogram performed through the [facility] guide catheter in the left internal carotid artery demonstrated complete angiographic revascularization of the left middle cerebral artery and flash filling of the left anterior cerebral artery to complete distribution. No angiographic evidence of filling defects or of occlusions were seen in the left MCA or left anterior cerebral artery distributions. There was, however, severe tapered narrowing due to spasm noted in the junction of the middle and the proximal 1/3 of the left internal carotid artery. The  exchange 014 inch Transend EX micro guidewire was then advanced using biplane roadmap technique constant fluoroscopic guidance to the petrous segment of the left internal carotid artery through the [facility] guide catheter. With the micro guidewire stable in the petrous segment, the [facility] guide catheter was retrieved to just proximal to the left common carotid bifurcation. A control arteriogram performed at this site demonstrated continued significant narrowing of the previously angioplastied site. It was decided to place a 6 mm/8 mm x 40 mm Xact stent across the previously symptomatic nearly completely occluded left internal carotid artery. Measurements had been performed of the left internal carotid artery just distal to the high-grade stenosis in the left internal carotid artery, and left common carotid artery proximally. The delivery portion of the stent was then retrogradely purged with heparinized saline infusion. Using the rapid exchange technique, the delivery apparatus with the stent was then advanced and positioned such that there was adequate coverage of the angioplastied segment distally and also proximally. Once established, this stent was then deployed without difficulty. With the exchange wire maintained distally, the delivery apparatus was retrieved and removed. A control arteriogram performed through the 8 [place] FlowGate guide catheter in the left common carotid artery demonstrated excellent apposition in the AP and lateral projections. No evidence of intraluminal filling defects were seen. A control arteriogram performed centered extra cranially and intracranially demonstrated significant spasm of the left internal carotid artery in the mid cervical portion. More distally, the left middle cerebral artery and the left anterior cerebral artery maintained complete revascularization without evidence of filling defects or of occlusions. Control arteriogram was then performed at 15, 30 and 40 minutes  post deployment of the stent. Given the significantly improved caliber in the AP and lateral projections, a post stent angioplasty was not performed. During this time, the patient was given a total of 3 mg of intra-arterial Integrilin through the [facility] guide catheter in the left common carotid artery to ensure no development of intraluminal platelet aggregation. Prior to the final arteriogram, the exchange micro guidewire was retrieved and removed. There was significant improvement in the spasm. Control arteriogram performed through the 8 [place] FlowGate guide catheter demonstrated excellent flow through the angioplastied segment with the stent in the proximal left internal carotid artery, with a TICI 3 revascularization achieved. Prior to the extracranial angioplasty of the high-grade proximal ICA stenosis, the patient was loaded with 180 mg of  Brilinta, and 81 mg of aspirin via an orogastric tube. No angiographic evidence of intracranial extravasation, mass or midline shift was seen. The patient's neurological and hemodynamic status remained stable. The FlowGate guide catheter was then retrieved into the abdominal aorta as was the 8 French neurovascular sheath. Both of these were then exchanged for an 8 [place] Pinnacle sheath. This in turn was then removed with the successful hemostasis achieved at the right groin puncture site using an 8 French Angio-Seal closure device. The right groin appeared soft without evidence of hematoma or bleeding. Distal pulses remained palpable in the dorsalis pedis, and posterior tibial regions bilaterally at the end of the procedure. A Dyna CT of the brain revealed no evidence of intracranial hemorrhage, mass effect or midline shift. Patient was left intubated to prevent aspiration due to difficulty controlling secretions prior to intubation. The patient was then transferred to the neuro ICU to continue with further management. IMPRESSION: Status post endovascular complete  revascularization of occluded left internal carotid artery terminus, left middle cerebral artery M1 segment, and left anterior cerebral artery, with 1 pass with a 5 mm x 33 mm Embotrap retrieval device, and aspiration assistance with a Penumbra aspiration simultaneously as described achieving a TICI 3 revascularization. Status post endovascular revascularization of now complete occlusion of the symptomatic left internal carotid artery proximally with stent assisted angioplasty. PLAN: Follow-up in clinic 4 weeks post discharge. Electronically Signed   By: Julieanne Cotton M.D.   On: 04/18/2018 10:47   Ct Cerebral Perfusion W Contrast  Result Date: 04/17/2018 EXAM: CT ANGIOGRAPHY HEAD AND NECK CT PERFUSION BRAIN TECHNIQUE: Multidetector CT imaging of the head and neck was performed using the standard protocol during bolus administration of intravenous contrast. Multiplanar CT image reconstructions and MIPs were obtained to evaluate the vascular anatomy. Carotid stenosis measurements (when applicable) are obtained utilizing NASCET criteria, using the distal internal carotid diameter as the denominator. Multiphase CT imaging of the brain was performed following IV bolus contrast injection. Subsequent parametric perfusion maps were calculated using RAPID software. CONTRAST:  ISOVUE-370 IOPAMIDOL (ISOVUE-370) INJECTION 76% COMPARISON:  None. FINDINGS: CTA NECK FINDINGS Aortic arch: Standard branching. Imaged portion shows no evidence of aneurysm or dissection. No significant stenosis of the major arch vessel origins. Right carotid system: No evidence of dissection, stenosis (50% or greater) or occlusion. Non stenotic calcific atherosclerosis of the carotid bifurcation. Left carotid system: Left proximal ICA thromboembolism. The ICA downstream to the thrombosis is opacified with contrast suggesting that the thrombus is near occlusive but not totally occlusive. Vertebral arteries: Left dominant. No evidence of  dissection, stenosis (50% or greater) or occlusion. Skeleton: No acute finding. Other neck: 7 mm nodule within the left lobe of the thyroid gland. Upper chest: Negative. Review of the MIP images confirms the above findings CTA HEAD FINDINGS Anterior circulation: Non stenotic calcific atherosclerosis of the carotid siphons. Large right A1, large anterior communicating artery, diminutive left A1, normal variant. Patent bilateral ACA and right MCA distributions. Left M1 occlusion with poor left MCA distribution collateralization. Posterior circulation: No significant stenosis, proximal occlusion, aneurysm, or vascular malformation. Venous sinuses: As permitted by contrast timing, patent. Anatomic variants: None significant. Review of the MIP images confirms the above findings CT Brain Perfusion Findings: CBF (<30%) Volume: 11mL Perfusion (Tmax>6.0s) volume: 77mL Mismatch Volume: 66mL Infarction Location:Left MCA distribution, ASPECTS 8 stroke on noncontrast CT. IMPRESSION: CT brain perfusion: Left MCA distribution large ischemic penumbra, 77 cc. Left frontal white matter and insula core infarct of 11  cc. ASPECTS 8 infarct on noncontrast CT of head with possible pseudonormalization of perfusion. CTA head: 1. Left M1 occlusion.  Poor left MCA distribution collateralization. 2. No additional intracranial large vessel occlusion, high-grade stenosis, aneurysm, or vascular malformation. CTA neck: 1. Left proximal ICA thromboembolism. The ICA downstream to the thrombosis is opacified with contrast suggesting that the thrombus is near occlusive, but not totally occlusive. 2. Patent right carotid system and bilateral vertebral arteries without hemodynamically significant stenosis by NASCET criteria, dissection, or aneurysm. These results were called by telephone at the time of interpretation on 04/17/2018 at 9:24 pm to Dr. Daryel November , who verbally acknowledged these results. Electronically Signed   By: Mitzi Hansen M.D.   On: 04/17/2018 21:35   Dg Chest Port 1 View  Result Date: 04/19/2018 CLINICAL DATA:  Acute respiratory failure EXAM: PORTABLE CHEST 1 VIEW COMPARISON:  Chest radiograph from one day prior. FINDINGS: Endotracheal tube tip is 2.0 cm above the carina. Enteric tube terminates in the proximal stomach. Stable cardiomediastinal silhouette with top-normal heart size. No pneumothorax. No pleural effusion. Mild platelike left retrocardiac atelectasis, similar. No pulmonary edema. No acute consolidative airspace disease. IMPRESSION: 1. Well-positioned support structures. 2. Mild platelike left retrocardiac atelectasis, similar. Electronically Signed   By: Delbert Phenix M.D.   On: 04/19/2018 09:09   Dg Abd Portable 1v  Result Date: 04/19/2018 CLINICAL DATA:  NG tube placement. EXAM: PORTABLE ABDOMEN - 1 VIEW COMPARISON:  None. FINDINGS: Tip and side port of the enteric tube below the diaphragm in the stomach. Visualized bowel gas pattern is normal. IMPRESSION: Tip and side port of the enteric tube below the diaphragm in the stomach. Electronically Signed   By: Narda Rutherford M.D.   On: 04/19/2018 22:49   Dg Swallowing Func-speech Pathology  Result Date: 04/20/2018 Objective Swallowing Evaluation: Type of Study: Bedside Swallow Evaluation  Patient Details Name: Kristin Solis MRN: [id number] Date of Birth: Apr 17, 1955 Today's Date: 04/20/2018 Time: SLP Start Time (ACUTE ONLY): 1107 -SLP Stop Time (ACUTE ONLY): 1128 SLP Time Calculation (min) (ACUTE ONLY): 21 min Past Medical History: Past Medical History: Diagnosis Date . Asthma  . Hypertension  Past Surgical History: Past Surgical History: Procedure Laterality Date . ABDOMINAL HYSTERECTOMY  2005  total . IR CT HEAD LTD  04/18/2018 . IR INTRAVSC STENT CERV CAROTID W/O EMB-PROT MOD SED INC ANGIO  04/18/2018 . IR PERCUTANEOUS ART THROMBECTOMY/INFUSION INTRACRANIAL INC DIAG ANGIO  04/18/2018 . OOPHORECTOMY   . RADIOLOGY WITH ANESTHESIA N/A 04/17/2018   Procedure: RADIOLOGY WITH ANESTHESIA;  Surgeon: Julieanne Cotton, MD;  Location: [facility] OR;  Service: Radiology;  Laterality: N/A; HPI: Pt presented to the ED following acute onset of dysphasia and right hemiparesis. However, on arrival her speech was fluent and she was able to relate to ED staff that she had been trying to tell her husband that a friend of hers daughter had committed suicide last night but she couldn't "get the words out." She further stated that she couldn't find her keys and couldn't "make a sentence" and her husband reported that she was also "slobbering all over herself" at that time. Code stroke was cancelled but subsequently reactivated when she again demonstrated difficulty speaking and dense right hemiparesis. CT head showed mild asymmetric hypoattenuation within the left lateral frontal lobe and anterior insula in comparison with the right which was felt possibly to represent acute stroke. Also noted was a small density within left Sylvian fissure, possible MCA branch thrombus. IV tPA was started and  CTA of the neck showed: Left proximal ICA thromboembolism. The ICA downstream to the thrombosis is opacified with contrast suggesting that the thrombus is near occlusive, but not totally occlusive. She underwent  lt common carotid arteriogram followed by complete endovascular revascularization by IR and was intubated by anesthesia at 22:38 on 04/17/2018 with extubation on 04/19/18 at 0910.  No data recorded Assessment / Plan / Recommendation CHL IP CLINICAL IMPRESSIONS 04/20/2018 Clinical Impression Pt was adequately alert for the study and NG tube was removed during the study by Marchelle Folks, RN due to concerns regarding its impact of swallow function. Pt presents with moderate oropharyngeal dysphagia. She demonstrated premature spillage secondary to impaired bolus control and base of tongue residue. Pharyngeally she exhibited vallecular residue secondary to reduced lingual retraction, and reduced anterior  laryngeal movement which resulted in pyriform sinus residue as well as penetration (PAS 3 and 5) of liquids during deglutition. Penetration consistently resulted in throat clearing and/or coughing. Throat clearing and coughing were effective in expelling the penetrant but subsequent penetration was noted following inhilation. A dysphagia 1 diet is recommended at this time. With SLP intervention and further recovery, pt may be able to tolerate nectar thick liquids and therapeutic trials of liquids will be administered by SLP; however, p.o. liquids are not otherwise recommended at this time and IV fluids will therefore be beneficial for hydration.  SLP Visit Diagnosis Dysphagia, pharyngeal phase (R13.13) Attention and concentration deficit following -- Frontal lobe and executive function deficit following -- Impact on safety and function Moderate aspiration risk;Mild aspiration risk   CHL IP TREATMENT RECOMMENDATION 04/20/2018 Treatment Recommendations Therapy as outlined in treatment plan below   Prognosis 04/20/2018 Prognosis for Safe Diet Advancement Good Barriers to Reach Goals Language deficits;Cognitive deficits;Severity of deficits;Behavior Barriers/Prognosis Comment -- CHL IP DIET RECOMMENDATION 04/20/2018 SLP Diet Recommendations Dysphagia 1 (Puree) solids;Pudding thick liquid;Other (Comment) Liquid Administration via [employer];Other (Comment) Medication Administration Crushed with puree Compensations Slow rate;Small sips/bites Postural Changes Remain semi-upright after after feeds/meals (Comment);Seated upright at 90 degrees   CHL IP OTHER RECOMMENDATIONS 04/20/2018 Recommended Consults -- Oral Care Recommendations Oral care BID Other Recommendations --   CHL IP FOLLOW UP RECOMMENDATIONS 04/20/2018 Follow up Recommendations Inpatient Rehab   CHL IP FREQUENCY AND DURATION 04/20/2018 Speech Therapy Frequency (ACUTE ONLY) min 2x/week Treatment Duration 2 weeks      CHL IP ORAL PHASE 04/20/2018 Oral Phase Impaired Oral - Pudding  Teaspoon -- Oral - Pudding Cup -- Oral - Honey Teaspoon -- Oral - Honey Cup Lingual/palatal residue;Other (Comment);Premature spillage Oral - Nectar Teaspoon -- Oral - Nectar Cup Lingual/palatal residue;Premature spillage Oral - Nectar Straw Premature spillage Oral - Thin Teaspoon Premature spillage Oral - Thin Cup -- Oral - Thin Straw -- Oral - Puree Other (Comment) Oral - Mech Soft WFL Oral - Regular -- Oral - Multi-Consistency -- Oral - Pill -- Oral Phase - Comment --  CHL IP PHARYNGEAL PHASE 04/20/2018 Pharyngeal Phase Impaired Pharyngeal- Pudding Teaspoon -- Pharyngeal -- Pharyngeal- Pudding Cup -- Pharyngeal -- Pharyngeal- Honey Teaspoon -- Pharyngeal -- Pharyngeal- Honey Cup Penetration/Aspiration during swallow;Penetration/Apiration after swallow;Pharyngeal residue - valleculae;Pharyngeal residue - pyriform;Reduced anterior laryngeal mobility Pharyngeal Material enters airway, CONTACTS cords and not ejected out;Material enters airway, remains ABOVE vocal cords and not ejected out Pharyngeal- Nectar Teaspoon -- Pharyngeal -- Pharyngeal- Nectar Cup Penetration/Aspiration during swallow;Reduced anterior laryngeal mobility;Pharyngeal residue - valleculae;Pharyngeal residue - pyriform;Delayed swallow initiation-vallecula Pharyngeal Material enters airway, remains ABOVE vocal cords and not ejected out Pharyngeal- Nectar Straw Penetration/Aspiration during swallow;Reduced anterior  laryngeal mobility;Pharyngeal residue - valleculae;Pharyngeal residue - pyriform Pharyngeal Material enters airway, remains ABOVE vocal cords and not ejected out Pharyngeal- Thin Teaspoon Delayed swallow initiation-vallecula;Penetration/Aspiration during swallow Pharyngeal Material enters airway, remains ABOVE vocal cords and not ejected out Pharyngeal- Thin Cup -- Pharyngeal -- Pharyngeal- Thin Straw -- Pharyngeal -- Pharyngeal- Puree Pharyngeal residue - valleculae Pharyngeal -- Pharyngeal- Mechanical Soft Penetration/Apiration after  swallow;Pharyngeal residue - valleculae;Pharyngeal residue - pyriform Pharyngeal Material enters airway, remains ABOVE vocal cords and not ejected out Pharyngeal- Regular -- Pharyngeal -- Pharyngeal- Multi-consistency -- Pharyngeal -- Pharyngeal- Pill -- Pharyngeal -- Pharyngeal Comment --  CHL IP CERVICAL ESOPHAGEAL PHASE 04/20/2018 Cervical Esophageal Phase WFL Pudding Teaspoon -- Pudding Cup -- Honey Teaspoon -- Honey Cup -- Nectar Teaspoon -- Nectar Cup -- Nectar Straw -- Thin Teaspoon -- Thin Cup -- Thin Straw -- Puree -- Mechanical Soft -- Regular -- Multi-consistency -- Pill -- Cervical Esophageal Comment -- Shanika I. Vear Clock, MS, CCC-SLP Acute Rehabilitation Services Office number [phone] Pager [phone] Scheryl Marten 04/20/2018, 2:04 PM              Ir Percutaneous Art Thrombectomy/infusion Intracranial Inc Diag Angio  Result Date: 04/19/2018 INDICATION: Acute onset of right-sided hemiplegia, left gaze deviation, and aphasia. Occluded left middle cerebral artery M1 segment, and near complete occlusion of the left internal carotid artery at the bulb by CT angiogram of the head and neck. EXAM: 1. EMERGENT LARGE VESSEL OCCLUSION THROMBOLYSIS (anterior CIRCULATION) COMPARISON:  CT angiogram of the head and neck of 04/17/2018. MEDICATIONS: Ancef 2 g IV antibiotic was administered within 1 hour of the procedure. ANESTHESIA/SEDATION: General anesthesia. CONTRAST:  Isovue 300 approximately 85 cc. FLUOROSCOPY TIME:  Fluoroscopy Time: 35 minutes 54 seconds (1959 mGy). COMPLICATIONS: None immediate. TECHNIQUE: Following a full explanation of the procedure along with the potential associated complications, an informed witnessed consent was obtained from the spouse. The risks of intracranial hemorrhage of 10%, worsening neurological deficit, ventilator dependency, death and inability to revascularize were all reviewed in detail with the patient's spouse. The patient was then put under general anesthesia by  the Department of Anesthesiology at [facility]. The right groin was prepped and draped in the usual sterile fashion. Thereafter using modified Seldinger technique, transfemoral access into the right common femoral artery was obtained without difficulty. Over a 0.035 inch guidewire a 5 French Pinnacle sheath was inserted. Through this, and also over a 0.035 inch guidewire a 5 [place] JB 1 catheter was advanced to the aortic arch region and selectively positioned in the left common carotid artery. FINDINGS: The left common carotid arteriogram demonstrates the left external carotid artery and its major branches to be widely patent. The left internal carotid artery just distal to the bulb has a severe irregular stenosis with near complete occlusion. This is associated with calcification along the lateral wall of the proximal left internal carotid artery. More distally, the vessel opacifies slowly to the cranial skull base. Wide patency is seen of the petrous and the cavernous segments. Complete angiographic occlusion was seen of the supraclinoid left ICA. Poor opacification is seen of the left posterior communicating artery and suggestion of trickle flow through the proximal left middle cerebral artery M1 segment. There is no opacification of the left anterior cerebral A1 segment. PROCEDURE: The diagnostic JB 1 catheter in the left common carotid artery was exchanged over a 0.035 inch 300 cm Rosen exchange guidewire for an 8 French 55 cm Brite tip neurovascular sheath. Good aspiration obtained from the side  port of the neurovascular sheath. This was then connected to continuous heparinized saline infusion. Over the exchange guidewire, an 8 [place] 85 cm FlowGate balloon guide catheter which had been prepped with 50% contrast and 50% heparinized saline infusion was advanced and positioned just proximal to the left common carotid bifurcation. The guidewire was removed. Good aspiration obtained from the hub of the  [facility] guide catheter. Gentle control arteriogram demonstrated no evidence of spasms, dissections or of intraluminal filling defects. Also there continued be severe high-grade pre occlusive stenosis of the left internal carotid proximally. Over a 0.014 inch Softip Synchro micro guidewire, an 021 Trevo ProVue microcatheter was advanced to just distal to the [facility] guide catheter. The micro guidewire was then gently manipulated with a torque device and advanced through the nearly completely occluded left internal carotid artery at the bulb. The combination was navigated without difficulty to the horizontal petrous segment. The guidewire was removed. Good aspiration obtained from the tip of the microcatheter now in the petrous horizontal segment. This in turn was then exchanged for a 0.014 inch Softip Transend 300 cm exchange micro guidewire using biplane roadmap technique and constant fluoroscopic guidance. The micro guidewire had a J configuration to avoid dissections or inducing spasm. A control arteriogram performed through the [facility] guide catheter in the left common carotid artery demonstrated slow antegrade flow distally. A 4 mm x 30 mm Viatrac 014 angioplasty balloon catheter which had been prepped with heparinized saline infusion was advanced using the rapid exchange technique and positioned adequate distance from the site of severe stenosis of the left internal carotid artery. Control angioplasty was then performed using a micro inflation syringe device via micro tubing where the balloon was inflated just above 8 atmospheres where it was maintained for about 30 seconds. Balloon was then deflated and retrieved proximally as the distal wire was maintained in the petrous horizontal segment. A control arteriogram performed through the [facility] guide catheter demonstrated significantly improved caliber and flow through the left internal carotid artery proximally. Over the exchange 014 inch micro guidewire, a  Trevo ProVue microcatheter inside of a 132 cm 6 [place] Catalyst guide catheter combination was advanced and positioned in the horizontal petrous segment. The guidewire was removed. Good aspiration obtained from the hub of the microcatheter. A control arteriogram was then performed through the 6 [place] Catalyst catheter which demonstrated angiographic occlusion of the supraclinoid left ICA with no flow in the left middle cerebral artery and slow flow in the anterior cerebral artery A1 segment with a filling defect. Over a 0.014 inch Softip Synchro micro guidewire, the combination of the microcatheter and the Catalyst guide catheter were advanced to the supraclinoid left ICA. The micro guidewire was then gently manipulated with a torque device and advanced through the occluded left middle cerebral artery into the dominant inferior division followed by the microcatheter. The Catalyst guide catheter was advanced to the supraclinoid left ICA. The micro guidewire was then retrieved and removed. Good aspiration obtained from the hub of the microcatheter. A gentle control arteriogram performed through the microcatheter demonstrated safe position of tip of the microcatheter. This was then connected to continuous heparinized saline infusion. A 5 mm x 33 mm Embotrap retrieval device was then advanced to the distal end of the microcatheter. At this time the Catalyst guide catheter was advanced into the proximal left M1 segment. The O ring on the delivery microcatheter was then loosened. With slight gentle forward traction with the right hand on the delivery micro guidewire, with  the left hand the delivery microcatheter was retrieved unsheathing the distal and then the proximal portion of the retrieval device. A gentle control arteriogram performed through the 6 [place] Catalyst guide catheter in the proximal left middle cerebral demonstrated complete angiographic nonvisualization. With proximal flow arrest of the Catalyst guide  catheter in the mid left internal carotid artery cervical segment, the combination of the retrieval device, the microcatheter and the 6 [place] Catalyst guide catheter was retrieved as constant aspiration was applied at the hub of the 8 [employer] FlowGate guide catheter, and also at the hub of the Catalyst guide catheter using a Penumbra suction aspiration device. Complete lock down of the Penumbra device was noted with no aspirate being obtained while the combination was retrieved and removed. Aspiration was continued as proximal flow arrest was reversed in the left internal carotid artery. Copious amounts of clot were noted in the aspirate, and also in the [place] at the hub of the [facility] guide catheter, and also in the Penumbra aspiration collector device. A control arteriogram performed through the [facility] guide [facility] in the left internal carotid artery demonstrated complete angiographic revascularization of the left middle cerebral artery and flash filling of the left anterior cerebral artery to complete distribution. No angiographic evidence of filling defects or of occlusions were seen in the left MCA or left anterior cerebral artery distributions. There was, however, severe tapered narrowing due to spasm noted in the junction of the middle and the proximal 1/3 of the left internal carotid artery. The exchange 014 inch Transend EX micro guidewire was then advanced using biplane roadmap technique constant fluoroscopic guidance to the petrous segment of the left internal carotid artery through the [facility] guide catheter. With the micro guidewire stable in the petrous segment, the [facility] guide catheter was retrieved to just proximal to the left common carotid bifurcation. A control arteriogram performed at this site demonstrated continued significant narrowing of the previously angioplastied site. It was decided to place a 6 mm/8 mm x 40 mm Xact stent across the previously symptomatic nearly completely  occluded left internal carotid artery. Measurements had been performed of the left internal carotid artery just distal to the high-grade stenosis in the left internal carotid artery, and left common carotid artery proximally. The delivery portion of the stent was then retrogradely purged with heparinized saline infusion. Using the rapid exchange technique, the delivery apparatus with the stent was then advanced and positioned such that there was adequate coverage of the angioplastied segment distally and also proximally. Once established, this stent was then deployed without difficulty. With the exchange wire maintained distally, the delivery apparatus was retrieved and removed. A control arteriogram performed through the 8 [place] FlowGate guide catheter in the left common carotid artery demonstrated excellent apposition in the AP and lateral projections. No evidence of intraluminal filling defects were seen. A control arteriogram performed centered extra cranially and intracranially demonstrated significant spasm of the left internal carotid artery in the mid cervical portion. More distally, the left middle cerebral artery and the left anterior cerebral artery maintained complete revascularization without evidence of filling defects or of occlusions. Control arteriogram was then performed at 15, 30 and 40 minutes post deployment of the stent. Given the significantly improved caliber in the AP and lateral projections, a post stent angioplasty was not performed. During this time, the patient was given a total of 3 mg of intra-arterial Integrilin through the [facility] guide catheter in the left common carotid artery to ensure no development of intraluminal  platelet aggregation. Prior to the final arteriogram, the exchange micro guidewire was retrieved and removed. There was significant improvement in the spasm. Control arteriogram performed through the 8 [place] FlowGate guide catheter demonstrated excellent flow  through the angioplastied segment with the stent in the proximal left internal carotid artery, with a TICI 3 revascularization achieved. Prior to the extracranial angioplasty of the high-grade proximal ICA stenosis, the patient was loaded with 180 mg of Brilinta, and 81 mg of aspirin via an orogastric tube. No angiographic evidence of intracranial extravasation, mass or midline shift was seen. The patient's neurological and hemodynamic status remained stable. The FlowGate guide catheter was then retrieved into the abdominal aorta as was the 8 French neurovascular sheath. Both of these were then exchanged for an 8 [place] Pinnacle sheath. This in turn was then removed with the successful hemostasis achieved at the right groin puncture site using an 8 French Angio-Seal closure device. The right groin appeared soft without evidence of hematoma or bleeding. Distal pulses remained palpable in the dorsalis pedis, and posterior tibial regions bilaterally at the end of the procedure. A Dyna CT of the brain revealed no evidence of intracranial hemorrhage, mass effect or midline shift. Patient was left intubated to prevent aspiration due to difficulty controlling secretions prior to intubation. The patient was then transferred to the neuro ICU to continue with further management. IMPRESSION: Status post endovascular complete revascularization of occluded left internal carotid artery terminus, left middle cerebral artery M1 segment, and left anterior cerebral artery, with 1 pass with a 5 mm x 33 mm Embotrap retrieval device, and aspiration assistance with a Penumbra aspiration simultaneously as described achieving a TICI 3 revascularization. Status post endovascular revascularization of now complete occlusion of the symptomatic left internal carotid artery proximally with stent assisted angioplasty. PLAN: Follow-up in clinic 4 weeks post discharge. Electronically Signed   By: Julieanne Cotton M.D.   On: 04/18/2018 10:47    Ct Head Code Stroke Wo Contrast`  Result Date: 04/17/2018 CLINICAL DATA:  Code stroke. 63 y/o F; transient episode of word-finding difficulty with recurrent symptoms including weakness of right arm and leg. EXAM: CT HEAD WITHOUT CONTRAST TECHNIQUE: Contiguous axial images were obtained from the base of the skull through the vertex without intravenous contrast. COMPARISON:  None. FINDINGS: Brain: Mild asymmetric hypoattenuation within the left lateral frontal lobe and anterior insula in comparison with the right which may represent acute stroke. ASPECTS is 8. No hemorrhage or mass effect. Several small chronic infarctions are present within the left parietal cortex. No extra-axial collection, hydrocephalus, or herniation. Vascular: Small density within left sylvian fissure may represent a MCA branch thrombus (series 6, image 11). Skull: Normal. Negative for fracture or focal lesion. Sinuses/Orbits: No acute finding. Other: None. ASPECTS [facility] Stroke Program Early CT Score) - Ganglionic level infarction (caudate, lentiform nuclei, internal capsule, insula, M1-M3 cortex): 5 - Supraganglionic infarction (M4-M6 cortex): 3 Total score (0-10 with 10 being normal): 8 IMPRESSION: 1. Mild asymmetric hypoattenuation within the left lateral frontal lobe and anterior insula in comparison with the right which may represent acute stroke. No hemorrhage or mass effect. 2. Small density within left Sylvian fissure, possible MCA branch thrombus. 3. ASPECTS is 8. 4. Several small chronic infarctions in left parietal cortex. These results were called by telephone at the time of interpretation on 04/17/2018 at 8:10 pm to Dr. Daryel November , who verbally acknowledged these results. Electronically Signed   By: Mitzi Hansen M.D.   On: 04/17/2018 20:12  Labs:  CBC: Recent Labs    04/17/18 1918 04/18/18 0454 04/19/18 0500 04/20/18 0615  WBC 9.9 12.9* 9.4 11.5*  HGB 14.1 13.1 10.6* 11.6*  HCT 43.9 40.6  33.4* 36.0  PLT 331 327 252 231    COAGS: Recent Labs    04/17/18 1918  INR 1.0  APTT 27    BMP: Recent Labs    04/17/18 1918 04/18/18 0454 04/19/18 0500 04/20/18 0615  NA 139 136 140 141  K 3.5 3.3* 3.1* 3.4*  CL 101 106 112* 110  CO2 27 23 22 23   GLUCOSE 163* 162* 115* 93  BUN 22 16 11 14   CALCIUM 8.9 8.3* 8.1* 8.5*  CREATININE 1.04* 0.89 0.86 0.96  GFRNONAA 58* >60 >60 >60  GFRAA >60 >60 >60 >60    LIVER FUNCTION TESTS: Recent Labs    09/22/17 1136 04/17/18 1918  BILITOT 0.3 0.6  AST 14 19  ALT 20 22  ALKPHOS 98 77  PROT 6.2 6.7  ALBUMIN 4.0 3.7    Assessment and Plan:  Left ICA, left MCA, and left ACA occlusions s/p emergent mechanical thrombectomy achieving a TICI 3 revascularization 04/17/2018 by Dr. Corliss Skains. Left ICA stenosis s/p revascularization using stent assisted angioplasty 04/17/2018 by Dr. Corliss Skains. Patient's condition stable- can spontaneously move all extremities with some weakness of RUE (can lift off bed against gravity but not fully extend). Right groin incision stable. Continue taking Brilinta 90 mg twice daily and Aspirin 81 mg once daily. Plan to follow-up with Dr. Corliss Skains in clinic 4 weeks after discharge. Appreciate and agree with neurology and CCM management. IR to follow.   Electronically Signed: Hoyt Koch, PA 04/21/2018, 11:15 AM   I spent a total of 25 Minutes at the the patient's bedside AND on the patient's hospital floor or unit, greater than 50% of which was counseling/coordinating care for left ICA, left MCA and left ACA occlusions s/p revascularization.

## 2018-04-22 ENCOUNTER — Encounter (HOSPITAL_COMMUNITY): Payer: Self-pay | Admitting: *Deleted

## 2018-04-22 MED ORDER — RESOURCE THICKENUP CLEAR PO POWD
ORAL | Status: DC | PRN
  Filled 2018-04-22: qty 125

## 2018-04-22 MED ORDER — IPRATROPIUM-ALBUTEROL 0.5-2.5 (3) MG/3ML IN SOLN: 3.0000 mL | Freq: Two times a day (BID) | RESPIRATORY_TRACT | 2 refills | Status: DC

## 2018-04-22 NOTE — Progress Notes (Signed)
Patient arrived in the floor at 2115 pm from 5N, alert and oriented x4, denied of pain, resting in a bed at this moment, and will continue to monitor.

## 2018-04-22 NOTE — Progress Notes (Signed)
STROKE TEAM PROGRESS NOTE   INTERVAL HISTORY Her   husband is at the bedside.    She is awake and following commands and moving the right side well.patient is unable to go to inpatient rehabilitation has her insurance will not cover it and she'll have to pay out of pocket. She did the [facility] 3 overnight sleep monitor for sleep apnea and tested positive. She will stay tonight and have CPAP titration.  Vitals:   04/22/18 0411 04/22/18 0732 04/22/18 0817 04/22/18 1137  BP: 128/61 (!) 133/57  (!) 114/48  Pulse: 63 (!) 58  63  Resp: _0 Temp: 98.1 F (36.7 C) 98.1 F (36.7 C)  98.3 F (36.8 C)  TempSrc: Oral Oral  Oral  SpO2: 96% 96% 96% 97%  Weight:      Height:        CBC:  Recent Labs  Lab 04/18/18 0454 04/19/18 0500 04/20/18 0615  WBC 12.9* 9.4 11.5*  NEUTROABS 12.0*  --  9.8*  HGB 13.1 10.6* 11.6*  HCT 40.6 33.4* 36.0  MCV 88.3 90.8 90.0  PLT 327 252 062    Basic Metabolic Panel:  Recent Labs  Lab 04/19/18 0500 04/20/18 0615  NA 140 141  K 3.1* 3.4*  CL 112* 110  CO2 22 23  GLUCOSE 115* 93  BUN 11 14  CREATININE 0.86 0.96  CALCIUM 8.1* 8.5*  MG  --  1.9  PHOS  --  2.2*   Lipid Panel:     Component Value Date/Time   CHOL 186 04/18/2018 0454   CHOL 196 03/26/2017 1437   TRIG 90 04/18/2018 0454   TRIG 97 04/18/2018 0454   HDL 66 04/18/2018 0454   HDL 73 03/26/2017 1437   CHOLHDL 2.8 04/18/2018 0454   VLDL 18 04/18/2018 0454   LDLCALC 102 (H) 04/18/2018 0454   LDLCALC 107 (H) 03/26/2017 1437   HgbA1c:  Lab Results  Component Value Date   HGBA1C 5.7 (H) 04/18/2018   Urine Drug Screen: No results found for: LABOPIA, COCAINSCRNUR, LABBENZ, AMPHETMU, THCU, LABBARB  Alcohol Level     Component Value Date/Time   ETH <10 04/17/2018 1918    IMAGING Ct Head Code Stroke Wo Contrast` 04/17/2018 1. Mild asymmetric hypoattenuation within the left lateral frontal lobe and anterior insula in comparison with the right which may represent acute stroke. No  hemorrhage or mass effect. 2. Small density within left Sylvian fissure, possible MCA branch thrombus. 3. ASPECTS is 8. 4. Several small chronic infarctions in left parietal cortex.   CTA head 04/17/2018 1. Left M1 occlusion.  Poor left MCA distribution collateralization. 2. No additional intracranial large vessel occlusion, high-grade stenosis, aneurysm, or vascular malformation.   CTA neck 04/17/2018 1. Left proximal ICA thromboembolism. The ICA downstream to the thrombosis is opacified with contrast suggesting that the thrombus is near occlusive, but not totally occlusive. 2. Patent right carotid system and bilateral vertebral arteries without hemodynamically significant stenosis   Ct Cerebral Perfusion W Contrast 04/17/2018 Left MCA distribution large ischemic penumbra, 77 cc. Left frontal white matter and insula core infarct of 11 cc. ASPECTS 8 infarct on noncontrast CT of head with possible pseudonormalization of perfusion.   Cerebral angiogram S/P lt common carotid arteriogram foll;owed by endovascular complete revascularization of occluded lt mac,Lt ACA aand Lt ICA terminus with x 1 pass with 5 mm x 33 mm embotrap retriver device achieving a TICI 3 revascularization. S/P stent assisted angioplasty of  severely stenotic Lt ICA  at the bulb . Patient loaded with 180 m8 of brilinta and 81 mg of aspirin . Also received 3 mg of IA integrelin.     MRI brain 04/18/2018 Patchy left MCA branch infarct.  Dg Chest 1 View 04/18/2018 1. Endotracheal tube tip just above the carina. Esophageal tube tip projects over the gastric fundal region and is directed cephalad near the GE junction 2. Streaky left perihilar and basilar atelectasis. Borderline cardiomegaly   Dg Chest 1 View 04/17/2018 No active disease.     PHYSICAL EXAM Obese middle-aged Caucasian lady who is not in distress . Afebrile. Head is nontraumatic. Neck is supple without bruit.    Cardiac exam no murmur or gallop. Lungs are clear to  auscultation. Distal pulses are well felt. Neurological Exam :  She is awake and alert with normal speech and language  Follows commands very well. Pupils equal reactive. Fundi not visualized.   Tongue is midline. Motor system exam shows right hemiparesis with right upper and lower extremity drift with grade  4/5 strength. Weakness of right grip and intrinsic hand muscles. Normal strength in the left side. Tone is diminished on the right. Deep tendon reflexes are depressed on the right compared to the left. Right plantar is upgoing left is downgoing.  ASSESSMENT/PLAN Ms. KIRSTEN SPEARING is a 63 y.o. female with history of asthma and HTN presenting to [facility] with dysphasia and R HP which occurred while talking about a traumatic event.  The event totally resolved.  While in the ED, she began having difficulty speaking again along with dense right hemiparesis.  She received tPA 04/17/2018 at 2032. CTA showed a dense LM1 occlusion with large ischemic penumbra on perfusion and CT a neck showed a left ICA embolus.  She was transferred to [facility] and sent to IR for mechanical thrombectomy where she had TICI 3 reperfusion using ambo trap and stent assisted angioplasty of severely stenotic L ICA at the bulb.  Stroke:  left MCA infarct s/p tPA and IR with ICA rescue stent placement, infarct likely d/t large vessel disease  extracranial carotid stenosis  Code Stroke CT head mild asymmetric hypoattenuation within the left lateral frontal lobe and anterior insula.  Small density left sylvian fissure possible MCA branch thrombus.  Several small chronic infarcts left parietal cortex.  ASPECTS 18    CTA head L M1 occlusion.  Poor L MCA distribution collateralization.  CTA neck left proximal ICA thromboembolism with downstream opacified digesting thrombus is near occlusive.  CT perfusion left MCA large ischemic penumbra 7 the 7 cc.  Left frontal white matter and insula core infarct 11 cc.  Angio occluded L MCA, L ACA  and L ICA terminus with x 1 pass embotrap w/ TICI 3 revascularization. stent assisted angioplasty of  severely stenotic Lt ICA bulb . received 3 mg of IA integrelin.  Post IR CT no ICH,mass effect or midline shift noted  24h CT head  Not done MRI  Multifocal acute LEFT MCA territory infarcts and anterior/posterior border zone infarcts with scattered petechial  hemorrhage.   2D Echo  Normal ejection fraction. No cardiac source of embolism.  LDL 102  HgbA1c 5.7  SCDs for VTE prophylaxis  No antithrombotic prior to admission, now on aspirin 81 mg daily and Brilinta (ticagrelor) 90 mg bid following load. Continue at d/c.  Therapy recommendations:  CLR  Disposition:  pending   Acute respiratory failure   Currently weaning  CCM following  Dysphagia   Secondary to stroke and  or intubation   Currently n.p.o.   Assess swallow post extubation  Hypertensive emergency  Treated with Cleviprex   Home medications resumed   Blood pressure goal per post TPA protocol   wean Cleviprex as able  . Long-term BP goal normotensive  Hyperlipidemia  Home meds: No statin  LDL 102, goal < 70  Add statin once able to swallow  Other Stroke Risk Factors  Obesity, Body mass index is 43.5 kg/m., recommend weight loss, diet and exercise as appropriate   Family hx stroke (son x 2)  Other Active Problems  Hypokalemia 3.3, repeat in am  Hospital day # 5    She presented with aphasia and right hemiparesis due to left MCA infarct symptomatic from proximal left ICA stenosis and underwent acute revascularization requiring distal left carotid stent followed by successful mechanical thrombectomy and complete revascularization. Her neurological exam is showing improvement. Discussed with the patient's husband at the bedside  Patient participated in the sleep smart study and tested positive for sleep apnea on overnight [facility] 3 monitor. She will get CPAP tolerability test tonight. Patient  unable to afford inpatient rehabilitation and will be discharged home tomorrow likely.Greater than 50% time during this 25 minute visit was spent on counseling and coordination of care about her symptomatic carotid stenosis discussion about stroke, carotid stent and answering questions.    Antony Contras, MD Medical Director [facility] Pager: [phone] 04/22/2018 1:58 PM  To contact Stroke Continuity provider, please refer to [email or web address] After hours, contact General Neurology

## 2018-04-22 NOTE — Progress Notes (Signed)
Occupational Therapy Treatment Patient Details Name: Kristin Solis MRN: [id number] DOB: 1955/07/28 Today's Date: 04/22/2018    History of present illness Pt is a 63 yo admitted with dysphagia and right weakness from [facility] s/p tPA with Left ACA/ICA/MCA occlusion s/p thrombectomy 3/1. pt extubated 3/3. PMhx: HTN, asthma   OT comments  [name] admitted with above presents to acute OT with making progress towards ADL goals and using RUE more in doing so. She will continue to benefit from acute OT with follow up HHOT and HHAid as well as 24 hour s/prn A with someone with her anytime she is up on her feet.  Follow Up Recommendations  CIR;Supervision/Assistance - 24 hour;Other (comment)(pt without benefits for CIR to HHOT and HHAid is recommended)    Equipment Recommendations  3 in 1 bedside commode       Precautions / Restrictions Precautions Precautions: Fall Precaution Comments: R sided weakness       Mobility Bed Mobility Overal bed mobility: Needs Assistance Bed Mobility: Supine to Sit Rolling: Supervision            Transfers Overall transfer level: Needs assistance Equipment used: Rolling walker (2 wheeled) Transfers: Sit to/from Stand Sit to Stand: Min guard                  ADL either performed or assessed with clinical judgement   ADL Overall ADL's : Needs assistance/impaired     Grooming: Wash/dry hands;Wash/dry face;Min guard;Standing                           Tub/ Shower Transfer: Minimal assistance;Ambulation;Rolling walker;3 in 1           Vision Baseline Vision/History: Wears glasses Wears Glasses: At all times            Cognition Arousal/Alertness: Awake/alert Behavior During Therapy: WFL for tasks assessed/performed Overall Cognitive Status: Impaired/Different from baseline Area of Impairment: Problem solving;Safety/judgement                       Following Commands: Follows one step commands with  increased time Safety/Judgement: Decreased awareness of safety;Decreased awareness of deficits   Problem Solving: Requires verbal cues General Comments: decreased awareness of position in RW and RUE placement                   Pertinent Vitals/ Pain       Pain Assessment: No/denies pain         Frequency  Min 3X/week        Progress Toward Goals  OT Goals(current goals can now be found in the care plan section)  Progress towards OT goals: Progressing toward goals     Plan Discharge plan remains appropriate       AM-PAC OT "6 Clicks" Daily Activity     Outcome Measure   Help from another person eating meals?: A Little Help from another person taking care of personal grooming?: A Little Help from another person toileting, which includes using toliet, bedpan, or urinal?: A Little Help from another person bathing (including washing, rinsing, drying)?: A Little Help from another person to put on and taking off regular upper body clothing?: A Little Help from another person to put on and taking off regular lower body clothing?: A Little 6 Click Score: 18    End of Session Equipment Utilized During Treatment: Gait belt  OT Visit Diagnosis: Unsteadiness on  feet (R26.81);Other abnormalities of gait and mobility (R26.89);Muscle weakness (generalized) (M62.81);Other symptoms and signs involving cognitive function;Hemiplegia and hemiparesis Hemiplegia - Right/Left: Right Hemiplegia - dominant/non-dominant: Dominant Hemiplegia - caused by: Cerebral infarction   Activity Tolerance Patient tolerated treatment well   Patient Left in chair;with call bell/phone within reach;with chair alarm set   Nurse Communication          Time: 0518-3358 OT Time Calculation (min): 26 min  Charges: OT General Charges $OT Visit: 1 Visit OT Treatments $Self Care/Home Management : 23-37 mins  Ignacia Palma, OTR/L Acute [employer] Pager [phone] Office  [phone]      Kristin Solis 04/22/2018, 6:30 PM

## 2018-04-22 NOTE — Plan of Care (Signed)
Patient stable, discussed POC with patient and spouse, agreeable with plan for [facility], denies question/concerns at this time.

## 2018-04-22 NOTE — Discharge Summary (Deleted)
Stroke Discharge Summary  Patient ID: Kristin Solis   MRN: [id number]      DOB: 11-12-1955  Date of Admission: 04/17/2018 Date of Discharge: 04/22/2018  Attending Physician:  Garvin Fila, MD, Stroke MD Consultant(s):   None Patient's PCP:  [name], NP  Discharge Diagnoses:  Active Problems:   Stroke (cerebrum) (HCC)   Middle cerebral artery embolism, left  Past Medical History:  Diagnosis Date  . Asthma   . Hypertension    Past Surgical History:  Procedure Laterality Date  . ABDOMINAL HYSTERECTOMY  2005   total  . IR CT HEAD LTD  04/18/2018  . IR INTRAVSC STENT CERV CAROTID W/O EMB-PROT MOD SED INC ANGIO  04/18/2018  . IR PERCUTANEOUS ART THROMBECTOMY/INFUSION INTRACRANIAL INC DIAG ANGIO  04/18/2018  . OOPHORECTOMY    . RADIOLOGY WITH ANESTHESIA N/A 04/17/2018   Procedure: RADIOLOGY WITH ANESTHESIA;  Surgeon: Luanne Bras, MD;  Location: [place];  Service: Radiology;  Laterality: N/A;    Medications to be continued on Rehab Allergies as of 04/22/2018   No Known Allergies     Medication List    STOP taking these medications   albuterol 108 (90 Base) MCG/ACT inhaler Commonly known as:  PROVENTIL HFA;VENTOLIN HFA   fluticasone 50 MCG/ACT nasal spray Commonly known as:  FLONASE   Fluticasone-Salmeterol 250-50 MCG/DOSE Aepb Commonly known as:  Advair Diskus   ibuprofen 800 MG tablet Commonly known as:  ADVIL,MOTRIN   lisinopril-hydrochlorothiazide 10-12.5 MG tablet Commonly known as:  PRINZIDE,ZESTORETIC   montelukast 10 MG tablet Commonly known as:  SINGULAIR   predniSONE 50 MG tablet Commonly known as:  DELTASONE     TAKE these medications   ipratropium-albuterol 0.5-2.5 (3) MG/3ML Soln Commonly known as:  DUONEB Take 3 mLs by nebulization 2 (two) times daily.       LABORATORY STUDIES CBC    Component Value Date/Time   WBC 11.5 (H) 04/20/2018 0615   RBC 4.00 04/20/2018 0615   HGB 11.6 (L) 04/20/2018 0615   HGB 13.6 03/26/2017 1437    HCT 36.0 04/20/2018 0615   HCT 42.3 03/26/2017 1437   PLT 231 04/20/2018 0615   PLT 312 03/26/2017 1437   MCV 90.0 04/20/2018 0615   MCV 88 03/26/2017 1437   MCV 86 07/14/2012 1014   MCH 29.0 04/20/2018 0615   MCHC 32.2 04/20/2018 0615   RDW 13.2 04/20/2018 0615   RDW 13.9 03/26/2017 1437   RDW 13.5 07/14/2012 1014   LYMPHSABS 0.6 (L) 04/20/2018 0615   LYMPHSABS 1.1 03/26/2017 1437   MONOABS 0.9 04/20/2018 0615   EOSABS 0.1 04/20/2018 0615   EOSABS 0.2 03/26/2017 1437   BASOSABS 0.0 04/20/2018 0615   BASOSABS 0.0 03/26/2017 1437   CMP    Component Value Date/Time   NA 141 04/20/2018 0615   NA 142 03/30/2018 1100   NA 142 07/14/2012 1014   K 3.4 (L) 04/20/2018 0615   K 3.5 07/14/2012 1014   CL 110 04/20/2018 0615   CL 108 (H) 07/14/2012 1014   CO2 23 04/20/2018 0615   CO2 27 07/14/2012 1014   GLUCOSE 93 04/20/2018 0615   GLUCOSE 106 (H) 07/14/2012 1014   BUN 14 04/20/2018 0615   BUN 15 03/30/2018 1100   BUN 12 07/14/2012 1014   CREATININE 0.96 04/20/2018 0615   CREATININE 0.94 07/14/2012 1014   CALCIUM 8.5 (L) 04/20/2018 0615   CALCIUM 9.0 07/14/2012 1014   PROT 6.7 04/17/2018 1918  PROT 6.2 09/22/2017 1136   PROT 6.9 07/14/2012 1014   ALBUMIN 3.7 04/17/2018 1918   ALBUMIN 4.0 09/22/2017 1136   ALBUMIN 3.4 07/14/2012 1014   AST 19 04/17/2018 1918   AST 27 07/14/2012 1014   ALT 22 04/17/2018 1918   ALT 37 07/14/2012 1014   ALKPHOS 77 04/17/2018 1918   ALKPHOS 103 07/14/2012 1014   BILITOT 0.6 04/17/2018 1918   BILITOT 0.3 09/22/2017 1136   BILITOT 0.3 07/14/2012 1014   GFRNONAA >60 04/20/2018 0615   GFRNONAA >60 07/14/2012 1014   GFRAA >60 04/20/2018 0615   GFRAA >60 07/14/2012 1014   COAGS Lab Results  Component Value Date   INR 1.0 04/17/2018   Lipid Panel    Component Value Date/Time   CHOL 186 04/18/2018 0454   CHOL 196 03/26/2017 1437   TRIG 90 04/18/2018 0454   TRIG 97 04/18/2018 0454   HDL 66 04/18/2018 0454   HDL 73 03/26/2017 1437    CHOLHDL 2.8 04/18/2018 0454   VLDL 18 04/18/2018 0454   LDLCALC 102 (H) 04/18/2018 0454   LDLCALC 107 (H) 03/26/2017 1437   HgbA1C  Lab Results  Component Value Date   HGBA1C 5.7 (H) 04/18/2018   Urinalysis    Component Value Date/Time   COLORURINE Yellow 07/14/2012 1014   APPEARANCEUR Clear 07/14/2012 1014   LABSPEC 1.012 07/14/2012 1014   PHURINE 7.0 07/14/2012 1014   GLUCOSEU Negative 07/14/2012 1014   HGBUR Negative 07/14/2012 1014   BILIRUBINUR Negative 07/14/2012 1014   KETONESUR Negative 07/14/2012 1014   PROTEINUR Negative 07/14/2012 1014   NITRITE Negative 07/14/2012 1014   LEUKOCYTESUR Negative 07/14/2012 1014   Urine Drug Screen No results found for: LABOPIA, COCAINSCRNUR, LABBENZ, AMPHETMU, THCU, LABBARB  Alcohol Level    Component Value Date/Time   ETH <10 04/17/2018 1918     SIGNIFICANT DIAGNOSTIC STUDIES IMAGING Ct Head Code Stroke Wo Contrast` 04/17/2018 1. Mild asymmetric hypoattenuation within the left lateral frontal lobe and anterior insula in comparison with the right which may represent acute stroke. No hemorrhage or mass effect. 2. Small density within left Sylvian fissure, possible MCA branch thrombus. 3. ASPECTS is 8. 4. Several small chronic infarctions in left parietal cortex.   CTA head 04/17/2018 1. Left M1 occlusion.  Poor left MCA distribution collateralization. 2. No additional intracranial large vessel occlusion, high-grade stenosis, aneurysm, or vascular malformation.   CTA neck 04/17/2018 1. Left proximal ICA thromboembolism. The ICA downstream to the thrombosis is opacified with contrast suggesting that the thrombus is near occlusive, but not totally occlusive. 2. Patent right carotid system and bilateral vertebral arteries without hemodynamically significant stenosis   Ct Cerebral Perfusion W Contrast 04/17/2018 Left MCA distribution large ischemic penumbra, 77 cc. Left frontal white matter and insula core infarct of 11 cc. ASPECTS 8  infarct on noncontrast CT of head with possible pseudonormalization of perfusion.   Cerebral angiogram S/P lt common carotid arteriogram foll;owed by endovascular complete revascularization of occluded lt mac,Lt ACA aand Lt ICA terminus with x 1 pass with 5 mm x 33 mm embotrap retriver device achieving a TICI 3 revascularization. S/P stent assisted angioplasty of severely stenotic Lt ICA at the bulb . Patient loaded with 180 m8 of brilinta and 81 mg of aspirin . Also received 3 mg of IA integrelin.  CT head 04/18/2018 pending   MRI brain 04/18/2018 pending   Dg Chest 1 View 04/18/2018 1. Endotracheal tube tip just above the carina. Esophageal tube tip projects over the  gastric fundal region and is directed cephalad near the GE junction 2. Streaky left perihilar and basilar atelectasis. Borderline cardiomegaly   Dg Chest 1 View 04/17/2018 No active disease.       HISTORY OF PRESENT ILLNESS Ms. BERNA GITTO is a 63 y.o. female with history of asthma and HTN presenting to [facility] with dysphasia and R HP which occurred while talking about a traumatic event.  The event totally resolved.  While in the ED, she began having difficulty speaking again along with dense right hemiparesis.  She received tPA 04/17/2018 at 2032. CTA showed a dense LM1 occlusion with large ischemic penumbra on perfusion and CT a neck showed a left ICA embolus.  She was transferred to [facility] and sent to IR for mechanical thrombectomy where she had TICI 3 reperfusion using ambo trap and stent assisted angioplasty of severely stenotic L ICA at the bulb.   DISCHARGE EXAM Blood pressure (!) 133/57, pulse (!) 58, temperature 98.1 F (36.7 C), temperature source Oral, resp. rate 20, height 5' 3"  (1.6 m), weight 111.4 kg, SpO2 96 %.   PHYSICAL EXAM Obese middle-aged Caucasian lady who is not in distress . Afebrile. Head is nontraumatic. Neck is supple without bruit.    Cardiac exam no murmur or gallop. Lungs are clear to  auscultation. Distal pulses are well felt. Neurological Exam :  She is awake and alert mild nonfluent aphasia  Follows commands very well. Pupils equal reactive. Fundi not visualized.   Tongue is midline. Motor system exam shows right hemiparesis with right upper and lower extremity drift with grade  4/5 strength. Weakness of right grip and intrinsic hand muscles. Normal strength in the left side. Tone is diminished on the right. Deep tendon reflexes are depressed on the right compared to the left. Right plantar is upgoing left is downgoing.  Discharge Diet   Diet Order            DIET DYS 2 Room service appropriate? Yes with Assist; Fluid consistency: Nectar Thick  Diet effective now             liquids  DISCHARGE PLAN  Disposition:  Transfer to [place] for ongoing PT, OT and ST  No antithrombotic for secondary stroke prevention.  Recommend ongoing risk factor control by Primary Care Physician at time of discharge from inpatient rehabilitation.  Follow-up Marnee Guarneri T, NP in 2 weeks following discharge from rehab.  Follow-up in [facility] in 4 weeks following discharge from rehab, office to schedule an appointment.   30  minutes were spent preparing discharge.  Laurey Morale, MSN, NP-C [facility] [phone]

## 2018-04-22 NOTE — Care Management Note (Signed)
Case Management Note Hortencia Conradi, RN MSN CCM Transitions of Care 8M/26M CM [phone] (cross coverage)  Patient Details  Name: Kristin Solis MRN: [id number] Date of Birth: 1955-06-23  Subjective/Objective:         Stroke           Action/Plan: PTA home with husband. Independent. Working for [employer]. Spoke with patient and husband at bedside. Unable to provide [facility] services d/t no skilled benefit through insurance. Unable to provide charity HH d/t having insurance despite lack of needed coverage. Spoke with benefits at [employer] on speaker with patient and husband to confirm lack of benefit. Patient insurance only covers outpatient medical appointments and medications-no hospitalization or skilled care benefit.  Spouse to provide transportation home and to PCP f/u appointments. Spouse to provide supervision, but has no back up to stay with patient if needed. Reviewed swallowing precautions when eating, and discussed supervised activity pacing herself. Patient verbalized understanding.   Anticipate transition home tomorrow.   Expected Discharge Date:                  Expected Discharge Plan:  Home/Self Care  In-House Referral:  Clinical Social Work  Discharge planning Services  CM Consult  Post Acute Care Choice:  NA Choice offered to:  Spouse, Patient  DME Arranged:  N/A DME Agency:  NA  HH Arranged:  NA HH Agency:  NA  Status of Service:  In process, will continue to follow  If discussed at Long Length of Stay Meetings, dates discussed:    Additional Comments:  Bess Kinds, RN 04/22/2018, 4:57 PM

## 2018-04-22 NOTE — Progress Notes (Signed)
Inpatient Rehabilitation-Admissions Coordinator   [facility] met with pt and her husband at the bedside. AC did discuss limited insurance benefits and they are aware of limited coverage for rehab. Noted new recommendations per PT for [facility] therapy. Both in agreement for Home with [place]. AC will contact CM/SW regarding new dispo needs.   AC will sign off.  Please call if questions.   Jhonnie Garner, OTR/L  Rehab Admissions Coordinator  [phone] 04/22/2018 12:24 PM

## 2018-04-22 NOTE — Progress Notes (Addendum)
Speech Language Pathology Treatment: Dysphagia;Cognitive-Linquistic  Patient Details Name: Kristin Solis MRN: [id number] DOB: 05/30/1955 Today's Date: 04/22/2018 Time: 0354-6568 SLP Time Calculation (min) (ACUTE ONLY): 25 min  Assessment / Plan / Recommendation Clinical Impression  Pt seen for diet tolerance/upgraded PO trials and aphasia tx targeting responsive naming and auditory comprehension at the paragraph level. Pt did not like breakfast this morning and reported to eat very little, but overall having no swallowing difficulty during intake. She also reports to have been tolerating cup of thin liquid sitting on her breakfast tray. Extra trials of thin given to confirm pt report. Small sips of thin yeilded no throat clearing or other s/sx of aspiration, however pt unable to complete 3oz water test. Belching and throat clears observed to occur after every few sips during test. Given previoius MBS findings of excess residue and penetration, recommend dys 2, nectar-thick liquid diet with f/u MBS next week.   Pt's speech intellgibility continues to improve each day and is understood 90-100% at the conversation level. She achieved 100% accuracy on responsive naming task and experienced some difficulty with divergent naming task requiring extra time to complete. She reported min- no difficulty with word-finding during these tasks, however mod word- finding difficulty observed during informal conversation.  She answered yes/no and open ended questions during an auditory comprehension task at the paragraph level with 100% accuracy independently. Recommend f/u to target word-finding strategies at conversation level and higher level auditory comprehension at the paragraph level.   HPI HPI: Pt presented to the ED following acute onset of dysphasia and right hemiparesis. However, on arrival her speech was fluent and she was able to relate to ED staff that she had been trying to tell her husband that a friend  of hers daughter had committed suicide last night but she couldn't "get the words out." She further stated that she couldn't find her keys and couldn't "make a sentence" and her husband reported that she was also "slobbering all over herself" at that time. Code stroke was cancelled but subsequently reactivated when she again demonstrated difficulty speaking and dense right hemiparesis. CT head showed mild asymmetric hypoattenuation within the left lateral frontal lobe and anterior insula in comparison with the right which was felt possibly to represent acute stroke. Also noted was a small density within left Sylvian fissure, possible MCA branch thrombus. IV tPA was started and CTA of the neck showed: Left proximal ICA thromboembolism. The ICA downstream to the thrombosis is opacified with contrast suggesting that the thrombus is near occlusive, but not totally occlusive. She underwent  lt common carotid arteriogram followed by complete endovascular revascularization by IR and was intubated by anesthesia at 22:38 on 04/17/2018 with extubation on 04/19/18 at 0910.       SLP Plan  MBS;Continue with current plan of care       Recommendations  Diet recommendations: Dysphagia 2 (fine chop);Nectar-thick liquid Liquids provided via: Straw;Cup Medication Administration: Whole meds with puree Supervision: Intermittent supervision to cue for compensatory strategies;Patient able to self feed Compensations: Slow rate;Small sips/bites;Chin tuck;Use straw to facilitate chin tuck;Clear throat after each swallow Postural Changes and/or Swallow Maneuvers: Seated upright 90 degrees;Upright 30-60 min after meal;Chin tuck                Oral Care Recommendations: Oral care BID Follow up Recommendations: Inpatient Rehab SLP Visit Diagnosis: Dysphagia, pharyngeal phase (R13.13) Plan: MBS;Continue with current plan of care       GO  Gardiner Ramus, Student SLP 04/22/2018, 10:05 AM

## 2018-04-22 NOTE — Progress Notes (Signed)
Physical Therapy Treatment Patient Details Name: Kristin Solis MRN: [id number] DOB: 09/08/1955 Today's Date: 04/22/2018    History of Present Illness Pt is a 63 yo admitted with dysphagia and right weakness from [facility] s/p tPA with Left ACA/ICA/MCA occlusion s/p thrombectomy 3/1. pt extubated 3/3. PMhx: HTN, asthma    PT Comments    Pt making good progress towards achieving her current functional mobility goals. Pt requiring less physical assistance overall and progressing to stair training this session with min guard. Pt's husband present throughout and PT reviewed level of assistance pt will need at home. PT provided pt's husband with gait belt as well. Pt would continue to benefit from skilled physical therapy services at this time while admitted and after d/c to address the below listed limitations in order to improve overall safety and independence with functional mobility.    Follow Up Recommendations  Home health PT;Supervision/Assistance - 24 hour     Equipment Recommendations  Rolling walker with 5" wheels;3in1 (PT)    Recommendations for Other Services       Precautions / Restrictions Precautions Precautions: Fall Precaution Comments: R sided weakness Restrictions Weight Bearing Restrictions: No    Mobility  Bed Mobility Overal bed mobility: Needs Assistance Bed Mobility: Supine to Sit;Sit to Supine     Supine to sit: Min guard Sit to supine: Min guard   General bed mobility comments: min guard for safety, use of bed rail, HOB elevated  Transfers Overall transfer level: Needs assistance Equipment used: Rolling walker (2 wheeled) Transfers: Sit to/from Stand Sit to Stand: Min guard         General transfer comment: cueing for safe hand placement, min guard for safety; no physical assistance needed  Ambulation/Gait Ambulation/Gait assistance: Min guard;Min assist Gait Distance (Feet): 150 Feet Assistive device: Rolling walker (2 wheeled) Gait  Pattern/deviations: Step-through pattern;Decreased step length - right;Decreased step length - left;Decreased stride length;Drifts right/left Gait velocity: decreased   General Gait Details: cueing required to maintain body within frame of RW; minor LOB x2 with fatigue requiring min A; constant min guard throughout   Stairs Stairs: Yes Stairs assistance: Min guard Stair Management: Two rails;Step to pattern;Forwards Number of Stairs: 2(x2 rounds) General stair comments: cueing for safety and technique   Wheelchair Mobility    Modified Rankin (Stroke Patients Only) Modified Rankin (Stroke Patients Only) Pre-Morbid Rankin Score: No symptoms Modified Rankin: Moderately severe disability     Balance Overall balance assessment: Needs assistance Sitting-balance support: No upper extremity supported;Feet supported Sitting balance-Leahy Scale: Good     Standing balance support: During functional activity Standing balance-Leahy Scale: Fair                              Cognition Arousal/Alertness: Awake/alert Behavior During Therapy: WFL for tasks assessed/performed Overall Cognitive Status: Impaired/Different from baseline Area of Impairment: Problem solving;Safety/judgement                         Safety/Judgement: Decreased awareness of safety;Decreased awareness of deficits   Problem Solving: Requires verbal cues        Exercises      General Comments        Pertinent Vitals/Pain Pain Assessment: No/denies pain    Home Living                      Prior Function  PT Goals (current goals can now be found in the care plan section) Acute Rehab PT Goals PT Goal Formulation: With patient/family Time For Goal Achievement: 05/03/18 Potential to Achieve Goals: Good Progress towards PT goals: Progressing toward goals    Frequency    Min 4X/week      PT Plan Current plan remains appropriate    Co-evaluation               AM-PAC PT "6 Clicks" Mobility   Outcome Measure  Help needed turning from your back to your side while in a flat bed without using bedrails?: A Little Help needed moving from lying on your back to sitting on the side of a flat bed without using bedrails?: A Little Help needed moving to and from a bed to a chair (including a wheelchair)?: A Little Help needed standing up from a chair using your arms (e.g., wheelchair or bedside chair)?: A Little Help needed to walk in hospital room?: A Little Help needed climbing 3-5 steps with a railing? : A Little 6 Click Score: 18    End of Session Equipment Utilized During Treatment: Gait belt Activity Tolerance: Patient tolerated treatment well Patient left: in bed;with call bell/phone within reach;with bed alarm set;with family/visitor present Nurse Communication: Mobility status;Precautions PT Visit Diagnosis: Other abnormalities of gait and mobility (R26.89);Muscle weakness (generalized) (M62.81);Other symptoms and signs involving the nervous system (R29.898)     Time: 1427-6701 PT Time Calculation (min) (ACUTE ONLY): 16 min  Charges:  $Gait Training: 8-22 mins                     Deborah Chalk, [place], DPT  Acute Rehabilitation Services Pager [phone] Office [phone]     Alessandra Bevels Shalisa Mcquade 04/22/2018, 12:19 PM

## 2018-04-23 DIAGNOSIS — E785 Hyperlipidemia, unspecified: Secondary | ICD-10-CM

## 2018-04-23 DIAGNOSIS — G4733 Obstructive sleep apnea (adult) (pediatric): Secondary | ICD-10-CM

## 2018-04-23 DIAGNOSIS — I63232 Cerebral infarction due to unspecified occlusion or stenosis of left carotid arteries: Secondary | ICD-10-CM

## 2018-04-23 LAB — CBC
HCT: 37.1 % (ref 36.0–46.0)
Hemoglobin: 11.6 g/dL — ABNORMAL LOW (ref 12.0–15.0)
MCH: 28 pg (ref 26.0–34.0)
MCHC: 31.3 g/dL (ref 30.0–36.0)
MCV: 89.6 fL (ref 80.0–100.0)
Platelets: 276 K/uL (ref 150–400)
RBC: 4.14 MIL/uL (ref 3.87–5.11)
RDW: 13.1 % (ref 11.5–15.5)
WBC: 6.1 K/uL (ref 4.0–10.5)
nRBC: 0 % (ref 0.0–0.2)

## 2018-04-23 LAB — BASIC METABOLIC PANEL WITH GFR
Anion gap: 8 (ref 5–15)
BUN: 13 mg/dL (ref 8–23)
CO2: 26 mmol/L (ref 22–32)
Calcium: 9 mg/dL (ref 8.9–10.3)
Chloride: 106 mmol/L (ref 98–111)
Creatinine, Ser: 0.93 mg/dL (ref 0.44–1.00)
GFR calc Af Amer: >60 mL/min
GFR calc non Af Amer: >60 mL/min
Glucose, Bld: 115 mg/dL — ABNORMAL HIGH (ref 70–99)
Potassium: 3.4 mmol/L — ABNORMAL LOW (ref 3.5–5.1)
Sodium: 140 mmol/L (ref 135–145)

## 2018-04-23 MED ORDER — ASPIRIN EC 81 MG PO TBEC
81.0000 mg | DELAYED_RELEASE_TABLET | Freq: Every day | ORAL | Status: DC
  Administered 2018-04-23: 81 mg via ORAL
  Filled 2018-04-23: qty 1

## 2018-04-23 MED ORDER — PANTOPRAZOLE SODIUM 40 MG PO TBEC: 40.0000 mg | DELAYED_RELEASE_TABLET | Freq: Every day | ORAL | Status: DC

## 2018-04-23 MED ORDER — IPRATROPIUM-ALBUTEROL 0.5-2.5 (3) MG/3ML IN SOLN: 3.0000 mL | RESPIRATORY_TRACT | Status: DC | PRN

## 2018-04-23 MED ORDER — RESOURCE THICKENUP CLEAR PO POWD: ORAL | 11 refills | Status: DC

## 2018-04-23 MED ORDER — TICAGRELOR 90 MG PO TABS: 90.0000 mg | ORAL_TABLET | Freq: Two times a day (BID) | ORAL | 2 refills | Status: DC

## 2018-04-23 MED ORDER — ATORVASTATIN CALCIUM 40 MG PO TABS: 40.0000 mg | ORAL_TABLET | Freq: Every day | ORAL | Status: DC

## 2018-04-23 MED ORDER — TICAGRELOR 90 MG PO TABS
90.0000 mg | ORAL_TABLET | Freq: Two times a day (BID) | ORAL | Status: DC
  Administered 2018-04-23: 90 mg via ORAL
  Filled 2018-04-23: qty 1

## 2018-04-23 MED ORDER — ASPIRIN 81 MG PO TBEC: 81.0000 mg | DELAYED_RELEASE_TABLET | Freq: Every day | ORAL | Status: AC

## 2018-04-23 MED ORDER — ATORVASTATIN CALCIUM 40 MG PO TABS: 40.0000 mg | ORAL_TABLET | Freq: Every day | ORAL | 11 refills | Status: DC

## 2018-04-23 MED ORDER — POTASSIUM CHLORIDE 20 MEQ PO PACK
40.0000 meq | PACK | Freq: Once | ORAL | Status: AC
  Administered 2018-04-23: 40 meq via ORAL
  Filled 2018-04-23: qty 2

## 2018-04-23 MED ORDER — IPRATROPIUM-ALBUTEROL 0.5-2.5 (3) MG/3ML IN SOLN: 3.0000 mL | RESPIRATORY_TRACT | 11 refills | Status: DC | PRN

## 2018-04-23 NOTE — Progress Notes (Signed)
Patient put CPAP on herself and did very well all night.

## 2018-04-23 NOTE — Progress Notes (Signed)
Pt ambulated without AD today. Mild unsteadiness noted. She is progressing well towards goals. Pt slightly impulsive with decreased safety awareness. Current plan remains appropriate.  Kallie Locks, [place] Pager [phone] Acute Rehab   04/23/18 1200  PT Visit Information  Last PT Received On 04/23/18  Assistance Needed +1  History of Present Illness Pt is a 63 yo admitted with dysphagia and right weakness from [facility] s/p tPA with Left ACA/ICA/MCA occlusion s/p thrombectomy 3/1. pt extubated 3/3. PMhx: HTN, asthma  Subjective Data  Patient Stated Goal return to work, hanging out at home  Precautions  Precautions Fall  Precaution Comments R sided weakness  Restrictions  Weight Bearing Restrictions No  Pain Assessment  Pain Assessment No/denies pain  Faces Pain Scale 0  Cognition  Arousal/Alertness Awake/alert  Behavior During Therapy WFL for tasks assessed/performed  Overall Cognitive Status Impaired/Different from baseline  Area of Impairment Problem solving;Safety/judgement  Following Commands Follows one step commands with increased time  Safety/Judgement Decreased awareness of safety;Decreased awareness of deficits (but getting better)  Awareness Emergent  Problem Solving Requires verbal cues  General Comments increase awareness of RUE on RW and staying in the middle of the hallway so as to not run into things  Bed Mobility  Overal bed mobility Independent  General bed mobility comments in chair on arrival  Transfers  Overall transfer level Needs assistance  Equipment used None  Transfers Sit to/from Stand  Sit to Stand Min guard  General transfer comment min guard for safety. Pt slightly impulsive, rising 2x without AD or therapist near by.  Ambulation/Gait  Ambulation/Gait assistance Min guard  Gait Distance (Feet) 125 Feet  Gait Pattern/deviations Step-through pattern;Decreased step length - right;Decreased step length - left;Decreased stride length;Drifts right/left   General Gait Details Cues for postural control and forward gaze. Pt mildly unsteady without AD. She began reaching for handrail as she fatigued.  Gait velocity decreased  Modified Rankin (Stroke Patients Only)  Pre-Morbid Rankin Score 0  Modified Rankin 4  Balance  Overall balance assessment Needs assistance  Sitting-balance support No upper extremity supported;Feet supported  Sitting balance-Leahy Scale Good  Sitting balance - Comments pt able to sit EOB x 5 min without physcial assist  Standing balance support During functional activity  Standing balance-Leahy Scale Fair  Standing balance comment able to ambulate without AD and min guard  Other Exercises  Other Exercises Sit<>stand 10x for neuromuscular re-ed  PT - End of Session  Equipment Utilized During Treatment Gait belt  Activity Tolerance Patient tolerated treatment well  Patient left with call bell/phone within reach;with family/visitor present;in chair  Nurse Communication Mobility status   PT - Assessment/Plan  PT Plan Current plan remains appropriate  PT Visit Diagnosis Other abnormalities of gait and mobility (R26.89);Muscle weakness (generalized) (M62.81);Other symptoms and signs involving the nervous system (R29.898)  PT Frequency (ACUTE ONLY) Min 4X/week  Recommendations for Other Services OT consult  Follow Up Recommendations Home health PT;Supervision/Assistance - 24 hour  PT equipment Rolling walker with 5" wheels;3in1 (PT)  AM-PAC PT "6 Clicks" Mobility Outcome Measure (Version 2)  Help needed turning from your back to your side while in a flat bed without using bedrails? 4  Help needed moving from lying on your back to sitting on the side of a flat bed without using bedrails? 4  Help needed moving to and from a bed to a chair (including a wheelchair)? 3  Help needed standing up from a chair using your arms (e.g., wheelchair or bedside chair)?  3  Help needed to walk in hospital room? 3  Help needed climbing 3-5  steps with a railing?  3  6 Click Score 20  Consider Recommendation of Discharge To: Home with no services  PT Goal Progression  Progress towards PT goals Progressing toward goals  Acute Rehab PT Goals  PT Goal Formulation With patient/family  Time For Goal Achievement 05/03/18  Potential to Achieve Goals Good  PT Time Calculation  PT Start Time (ACUTE ONLY) 1044  PT Stop Time (ACUTE ONLY) 1058  PT Time Calculation (min) (ACUTE ONLY) 14 min  PT General Charges  $$ ACUTE PT VISIT 1 Visit  PT Treatments  $Gait Training 8-22 mins

## 2018-04-23 NOTE — Discharge Summary (Addendum)
Patient ID: Kristin Solis   MRN: [id number]      DOB: [birth year]  Date of Admission: 04/17/2018 Date of Discharge: 04/23/2018  Attending Physician:  Garvin Fila, MD, Stroke MD Consultant(s):    IR - Dr Estanislado Pandy  ; Critical Care - Dr Gilford Raid  ; Pharmacy Patient's PCP:  [name], NP  DISCHARGE DIAGNOSIS:  Left MCA infarct s/p tPA and IR with ICA rescue stent placement  Active Problems:  Hypertensive emergency  Hyperlipidemia  OSA  Dysphasia  Obesity   Past Medical History:  Diagnosis Date  . Asthma   . Hypertension    Past Surgical History:  Procedure Laterality Date  . ABDOMINAL HYSTERECTOMY  2005   total  . IR CT HEAD LTD  04/18/2018  . IR INTRAVSC STENT CERV CAROTID W/O EMB-PROT MOD SED INC ANGIO  04/18/2018  . IR PERCUTANEOUS ART THROMBECTOMY/INFUSION INTRACRANIAL INC DIAG ANGIO  04/18/2018  . OOPHORECTOMY    . RADIOLOGY WITH ANESTHESIA N/A 04/17/2018   Procedure: RADIOLOGY WITH ANESTHESIA;  Surgeon: Luanne Bras, MD;  Location: [place];  Service: Radiology;  Laterality: N/A;    Allergies as of 04/23/2018   No Known Allergies     Medication List    STOP taking these medications   ibuprofen 800 MG tablet Commonly known as:  ADVIL,MOTRIN   lisinopril-hydrochlorothiazide 10-12.5 MG tablet Commonly known as:  PRINZIDE,ZESTORETIC   montelukast 10 MG tablet Commonly known as:  SINGULAIR   predniSONE 50 MG tablet Commonly known as:  DELTASONE     TAKE these medications   albuterol 108 (90 Base) MCG/ACT inhaler Commonly known as:  PROVENTIL HFA;VENTOLIN HFA Inhale 2 puffs into the lungs every 6 (six) hours as needed for wheezing or shortness of breath.   aspirin 81 MG EC tablet Take 1 tablet (81 mg total) by mouth daily.   atorvastatin 40 MG tablet Commonly known as:  LIPITOR Take 1 tablet (40 mg total) by mouth daily at 6 PM.   fluticasone 50 MCG/ACT nasal spray Commonly known as:  FLONASE Place 2 sprays into both nostrils daily.    Fluticasone-Salmeterol 250-50 MCG/DOSE Aepb Commonly known as:  Advair Diskus Inhale 1 puff into the lungs 2 (two) times daily.   Resource ThickenUp Clear Powd Use as needed to thicken liquids - nectar thick   ticagrelor 90 MG Tabs tablet Commonly known as:  BRILINTA Take 1 tablet (90 mg total) by mouth 2 (two) times daily.       HOME MEDICATIONS PRIOR TO ADMISSION Medications Prior to Admission  Medication Sig Dispense Refill  . albuterol (PROVENTIL HFA;VENTOLIN HFA) 108 (90 Base) MCG/ACT inhaler Inhale 2 puffs into the lungs every 6 (six) hours as needed for wheezing or shortness of breath. 3 Inhaler 3  . fluticasone (FLONASE) 50 MCG/ACT nasal spray Place 2 sprays into both nostrils daily. 48 g 3  . Fluticasone-Salmeterol (ADVAIR DISKUS) 250-50 MCG/DOSE AEPB Inhale 1 puff into the lungs 2 (two) times daily. 240 each 3  . lisinopril-hydrochlorothiazide (PRINZIDE,ZESTORETIC) 10-12.5 MG tablet Take 1 tablet by mouth daily. 90 tablet 1  . predniSONE (DELTASONE) 50 MG tablet Take 1 tablet (50 mg total) by mouth daily with breakfast. 5 tablet 0  . ibuprofen (ADVIL,MOTRIN) 800 MG tablet Take 1 tablet (800 mg total) by mouth every 8 (eight) hours as needed. (Patient not taking: Reported on 04/18/2018) 30 tablet 0  . montelukast (SINGULAIR) 10 MG tablet Take 1 tablet (10 mg total) by mouth at bedtime. (  Patient not taking: Reported on 04/18/2018) 90 tablet 1     HOSPITAL MEDICATIONS . aspirin EC  81 mg Oral Daily  . atorvastatin  40 mg Oral q1800  . pantoprazole  40 mg Oral QHS  . ticagrelor  90 mg Oral BID    LABORATORY STUDIES CBC    Component Value Date/Time   WBC 6.1 04/23/2018 1011   RBC 4.14 04/23/2018 1011   HGB 11.6 (L) 04/23/2018 1011   HGB 13.6 03/26/2017 1437   HCT 37.1 04/23/2018 1011   HCT 42.3 03/26/2017 1437   PLT 276 04/23/2018 1011   PLT 312 03/26/2017 1437   MCV 89.6 04/23/2018 1011   MCV 88 03/26/2017 1437   MCV 86 07/14/2012 1014   MCH 28.0 04/23/2018 1011    MCHC 31.3 04/23/2018 1011   RDW 13.1 04/23/2018 1011   RDW 13.9 03/26/2017 1437   RDW 13.5 07/14/2012 1014   LYMPHSABS 0.6 (L) 04/20/2018 0615   LYMPHSABS 1.1 03/26/2017 1437   MONOABS 0.9 04/20/2018 0615   EOSABS 0.1 04/20/2018 0615   EOSABS 0.2 03/26/2017 1437   BASOSABS 0.0 04/20/2018 0615   BASOSABS 0.0 03/26/2017 1437   CMP    Component Value Date/Time   NA 140 04/23/2018 1011   NA 142 03/30/2018 1100   NA 142 07/14/2012 1014   K 3.4 (L) 04/23/2018 1011   K 3.5 07/14/2012 1014   CL 106 04/23/2018 1011   CL 108 (H) 07/14/2012 1014   CO2 26 04/23/2018 1011   CO2 27 07/14/2012 1014   GLUCOSE 115 (H) 04/23/2018 1011   GLUCOSE 106 (H) 07/14/2012 1014   BUN 13 04/23/2018 1011   BUN 15 03/30/2018 1100   BUN 12 07/14/2012 1014   CREATININE 0.93 04/23/2018 1011   CREATININE 0.94 07/14/2012 1014   CALCIUM 9.0 04/23/2018 1011   CALCIUM 9.0 07/14/2012 1014   PROT 6.7 04/17/2018 1918   PROT 6.2 09/22/2017 1136   PROT 6.9 07/14/2012 1014   ALBUMIN 3.7 04/17/2018 1918   ALBUMIN 4.0 09/22/2017 1136   ALBUMIN 3.4 07/14/2012 1014   AST 19 04/17/2018 1918   AST 27 07/14/2012 1014   ALT 22 04/17/2018 1918   ALT 37 07/14/2012 1014   ALKPHOS 77 04/17/2018 1918   ALKPHOS 103 07/14/2012 1014   BILITOT 0.6 04/17/2018 1918   BILITOT 0.3 09/22/2017 1136   BILITOT 0.3 07/14/2012 1014   GFRNONAA >60 04/23/2018 1011   GFRNONAA >60 07/14/2012 1014   GFRAA >60 04/23/2018 1011   GFRAA >60 07/14/2012 1014   COAGS Lab Results  Component Value Date   INR 1.0 04/17/2018   Lipid Panel    Component Value Date/Time   CHOL 186 04/18/2018 0454   CHOL 196 03/26/2017 1437   TRIG 90 04/18/2018 0454   TRIG 97 04/18/2018 0454   HDL 66 04/18/2018 0454   HDL 73 03/26/2017 1437   CHOLHDL 2.8 04/18/2018 0454   VLDL 18 04/18/2018 0454   LDLCALC 102 (H) 04/18/2018 0454   LDLCALC 107 (H) 03/26/2017 1437   HgbA1C  Lab Results  Component Value Date   HGBA1C 5.7 (H) 04/18/2018    Urinalysis    Component Value Date/Time   COLORURINE Yellow 07/14/2012 1014   APPEARANCEUR Clear 07/14/2012 1014   LABSPEC 1.012 07/14/2012 1014   PHURINE 7.0 07/14/2012 1014   GLUCOSEU Negative 07/14/2012 1014   HGBUR Negative 07/14/2012 1014   BILIRUBINUR Negative 07/14/2012 1014   KETONESUR Negative 07/14/2012 1014   PROTEINUR Negative 07/14/2012  1014   NITRITE Negative 07/14/2012 1014   LEUKOCYTESUR Negative 07/14/2012 1014   Urine Drug Screen No results found for: LABOPIA, COCAINSCRNUR, LABBENZ, AMPHETMU, THCU, LABBARB  Alcohol Level    Component Value Date/Time   ETH <10 04/17/2018 1918     SIGNIFICANT DIAGNOSTIC STUDIES  Ct Head Code Stroke Wo Contrast 04/17/2018 1. Mild asymmetric hypoattenuation within the left lateral frontal lobe and anterior insula in comparison with the right which may represent acute stroke. No hemorrhage or mass effect. 2. Small density within left Sylvian fissure, possible MCA branch thrombus. 3. ASPECTS is 8. 4. Several small chronic infarctions in left parietal cortex.   CTA head 04/17/2018 1. Left M1 occlusion.  Poor left MCA distribution collateralization. 2. No additional intracranial large vessel occlusion, high-grade stenosis, aneurysm, or vascular malformation.   CTA neck 04/17/2018 1. Left proximal ICA thromboembolism. The ICA downstream to the thrombosis is opacified with contrast suggesting that the thrombus is near occlusive, but not totally occlusive. 2. Patent right carotid system and bilateral vertebral arteries without hemodynamically significant stenosis   Ct Cerebral Perfusion W Contrast 04/17/2018 Left MCA distribution large ischemic penumbra, 77 cc. Left frontal white matter and insula core infarct of 11 cc. ASPECTS 8 infarct on noncontrast CT of head with possible pseudonormalization of perfusion.   Cerebral angiogram S/P lt common carotid arteriogram foll;owed by endovascular complete revascularization of occluded lt  mac,Lt ACA aand Lt ICA terminus with x 1 pass with 5 mm x 33 mm embotrap retriver device achieving a TICI 3 revascularization. S/P stent assisted angioplasty of severely stenotic Lt ICA at the bulb . Patient loaded with 180 m8 of brilinta and 81 mg of aspirin . Also received 3 mg of IA integrelin.   MRI brain 04/18/2018 Patchy left MCA branch infarct.  Dg Chest 1 View 04/18/2018 1. Endotracheal tube tip just above the carina. Esophageal tube tip projects over the gastric fundal region and is directed cephalad near the GE junction 2. Streaky left perihilar and basilar atelectasis. Borderline cardiomegaly   Dg Chest 1 View 04/17/2018 No active disease.    Transthoracic Echocardiogram  04/18/2018 IMPRESSIONS   1. The left ventricle has hyperdynamic systolic function, with an ejection fraction of >65%. The cavity size was normal. There is moderately increased left ventricular wall thickness. Left ventricular diastolic Doppler parameters are consistent with  impaired relaxation.  2. The right ventricle has normal systolic function. The cavity was normal. There is no increase in right ventricular wall thickness.  3. The mitral valve is normal in structure.  4. The tricuspid valve is normal in structure.  5. The aortic valve is tricuspid.  6. The pulmonic valve was normal in structure.     HISTORY OF PRESENT ILLNESS (From Dr Rozell Searing Admission H&P) Kristin Solis is an 63 y.o. female with a history of asthma and HTN who was emergently transferred from the [facility] ED to [facility] for management of an acute stroke. She received tPA at [facility] prior to transfer.    She was at home in her US[place] when at 5:30 PM she developed acute onset of dysphasia and right hemiparesis. This occurred while she was describing to her husband a psychologically traumatic recent event. EMS was called and a Code Stroke was initiated. She was brought to the ED and on arrival her speech was fluent. She was able at that point to  relate to ED staff that she had been trying "to tell her husband that a friend of hers daughter had  committed suicide last night and I couldn't get the words out." She also stated that she couldn't find her keys and couldn't "make a sentence." Per husband, she also was "slobbering all over herself" at that time. Her initial EDP exam was completely normal per chart. Given full recovery, the Code Stroke was then cancelled. At 7:42 PM she was noted to be having difficulty speaking again, in conjunction with dense right hemiparesis. Code Stroke was reactivated.   CT head showed mild asymmetric hypoattenuation within the left lateral frontal lobe and anterior insula in comparison with the right which was felt possibly to represent acute stroke. Also noted was a small density within left Sylvian fissure, possible MCA branch thrombus.   Teleneurology consult was obtained and she was deemed to be a candidate for tPA. After tPA infusion was started, a STAT CTA of head and neck with perfusion study was obtained, with results as follows:  CT brain perfusion: Left MCA distribution large ischemic penumbra, 77 cc. Left frontal white matter and insula core infarct of 11 cc. ASPECTS 8 infarct on noncontrast CT of head with possible pseudonormalization of perfusion.  CTA head: 1. Left M1 occlusion. Poor left MCA distribution collateralization. 2. No additional intracranial large vessel occlusion, high-grade stenosis, aneurysm, or vascular malformation.  CTA neck: 1. Left proximal ICA thromboembolism. The ICA downstream to the thrombosis is opacified with contrast suggesting that the thrombus is near occlusive, but not totally occlusive. 2. Patent right carotid system and bilateral vertebral arteries without hemodynamically significant stenosis by NASCET criteria, dissection, or aneurysm.  Review of images by Neurohospitalist also revealed findings suggestive of a left M2 occlusion with poor collateral  flow distally.   The patient was emergently transported to [facility] for VIR after discussion with Dr. Estanislado Pandy. mRS is 0.   LSN: 7:30 PM tPA Given: Yes, at [place] [name] is a 63 y.o. female with history of asthma and HTN presenting to [facility] with dysphasia and R HP which occurred while talking about a traumatic event.  The event totally resolved.  While in the ED, she began having difficulty speaking again along with dense right hemiparesis.  She received tPA 04/17/2018 at 2032. CTA showed a dense LM1 occlusion with large ischemic penumbra on perfusion and CT a neck showed a left ICA embolus.  She was transferred to [facility] and sent to IR for mechanical thrombectomy where she had TICI 3 reperfusion using ambo trap and stent assisted angioplasty of severely stenotic L ICA at the bulb.  Stroke:  left MCA infarct s/p tPA and IR with TICI3 reperfusion and left ICA rescue stent, infarct likely d/t large vessel disease extracranial carotid stenosis  Code Stroke CT head mild asymmetric hypoattenuation within the left lateral frontal lobe and anterior insula.  Small density left sylvian fissure possible MCA branch thrombus.  Several small chronic infarcts left parietal cortex.  ASPECTS 18    CTA head L M1 occlusion.  Poor L MCA distribution collateralization.  CTA neck left proximal ICA thromboembolism with downstream opacified digesting thrombus is near occlusive.  CT perfusion left MCA large ischemic penumbra 7 the 7 cc.  Left frontal white matter and insula core infarct 11 cc.  Angio occluded L MCA, L ACA and L ICA terminus with x 1 pass embotrap w/ TICI 3 revascularization. stent assisted angioplasty of severely stenotic Lt ICA bulb . received 3 mg of IA integrelin.  Post IR CT no ICH,mass effect or midline shift noted  MRI  Multifocal acute LEFT MCA territory infarcts and anterior/posterior border zone infarcts with scattered petechial hemorrhage.   2D Echo  Normal  ejection fraction. No cardiac source of embolism.  LDL 102  HgbA1c 5.7  SCDs for VTE prophylaxis  No antithrombotic prior to admission, now on aspirin 81 mg daily and Brilinta (ticagrelor) 90 mg bid following load. Continue at d/c.  Therapy recommendations:  HH PT and OT  Disposition:  discharge to home  Dysphagia   Secondary to stroke and or intubation   Currently dysphagia 2 with nectar thick liquid  Hypertensive emergency  Treated with Cleviprex, now off  Home medications resumed   Long-term BP goal normotensive  Hyperlipidemia  Home meds: No statin  LDL 102, goal < 70  Add Lipitor 40 mg daily  Continue statin on discharge  OSA  She did the [place] 3 overnight sleep monitor for sleep apnea and tested positive.   She had CPAP titration performed.  Device supplied to patient at discharge.  Other Stroke Risk Factors  Obesity, Body mass index is 43.5 kg/m., recommend weight loss, diet and exercise as appropriate   Family hx stroke (son x 2)  Other Active Problems  Hypokalemia 3.4, repeat 3.4 -> supplemented   DISCHARGE EXAM Vitals:   04/22/18 2045 04/22/18 2310 04/23/18 0357 04/23/18 0850  BP:  (!) 115/51 (!) 114/41   Pulse:  60 63   Resp:  20 16   Temp:  98.1 F (36.7 C) 97.8 F (36.6 C)   TempSrc:  Axillary Oral   SpO2: 98% 98% 98% 98%  Weight:      Height:       Obese middle-aged Caucasian lady who is not in distress . Afebrile. Head is nontraumatic. Neck is supple without bruit.    Cardiac exam no murmur or gallop. Lungs are clear to auscultation. Distal pulses are well felt. Neurological Exam :  She is awake and alert with normal speech and language  Follows commands very well. Pupils equal reactive. Fundi not visualized.   Tongue is midline. Motor system exam shows right hemiparesis with right upper and lower extremity drift with grade  4/5 strength. Weakness of right grip and intrinsic hand muscles. Normal strength in the left side.  Tone is diminished on the right. Deep tendon reflexes are depressed on the right compared to the left. Right plantar is upgoing left is downgoing.  Discharge Diet    Diet Order            DIET DYS 2 Room service appropriate? Yes with Assist; Fluid consistency: Nectar Thick  Diet effective now             liquids     DISCHARGE PLAN  Disposition:  Discharge to home  aspirin 81 mg daily and Brilinta 90 mg twice daily for secondary stroke prevention.  Ongoing risk factor control by Primary Care Physician at time of discharge  Follow-up [name], Henrine Screws T, NP in 2 weeks.  Follow-up in [facility] in 4 weeks, office to schedule an appointment.   35 minutes were spent preparing discharge.  Rosalin Hawking, MD PhD Stroke Neurology 04/23/2018 5:00 PM

## 2018-04-23 NOTE — Discharge Instructions (Signed)
1. You were started on Lipitor to lower your cholesterol to decrease the risk of another stroke. Call your doctor if you develop muscle pain or dark colored urine.  2. Home therapies will be scheduled. 3. Your blood pressure is controlled right now. Do not restart your home blood pressure medications until instructed to do so by your doctor. 4. Your potassium runs low. Try to eat foods high in potassium. You will receive a list.

## 2018-04-23 NOTE — Progress Notes (Signed)
Occupational Therapy Treatment and Discharge Patient Details Name: Kristin Solis MRN: [id number] DOB: 1955-07-10 Today's Date: 04/23/2018    History of present illness Pt is a 63 yo admitted with dysphagia and right weakness from [facility] s/p tPA with Left ACA/ICA/MCA occlusion s/p thrombectomy 3/1. pt extubated 3/3. PMhx: HTN, asthma   OT comments  [name] admitted with above presents to acute OT making great progress with basic ADLs and use of RUE. She will continue to benefit from [facility]. We will sign off from an acute OT standpoint.  Follow Up Recommendations  Home health OT;Supervision/Assistance - 24 hour    Equipment Recommendations  3 in 1 bedside commode       Precautions / Restrictions Precautions Precautions: Fall Precaution Comments: R sided weakness Restrictions Weight Bearing Restrictions: No       Mobility Bed Mobility Overal bed mobility: Independent                Transfers Overall transfer level: Needs assistance Equipment used: Rolling walker (2 wheeled) Transfers: Sit to/from Stand Sit to Stand: Min guard         General transfer comment: cueing for safe hand placement, min guard for safety; no physical assistance needed        ADL either performed or assessed with clinical judgement   ADL Overall ADL's : Needs assistance/impaired                     Lower Body Dressing: Min guard;Sit to/from stand           Tub/ Shower Transfer: Ambulation;Rolling walker;3 in Economist Details (indicate cue type and reason): husband present to see technique   General ADL Comments: Made pt and husband aware that husband needs to be with patient any time she is up on her feet until he feels she is safe enough to not need him     Vision Baseline Vision/History: Wears glasses Wears Glasses: At all times            Cognition Arousal/Alertness: Awake/alert Behavior During Therapy: WFL for tasks  assessed/performed Overall Cognitive Status: Impaired/Different from baseline Area of Impairment: Problem solving;Safety/judgement                       Following Commands: Follows one step commands with increased time Safety/Judgement: Decreased awareness of safety;Decreased awareness of deficits(but getting better) Awareness: Emergent Problem Solving: Requires verbal cues General Comments: increase awareness of RUE on RW and staying in the middle of the hallway so as to not run into things        Exercises Other Exercises Other Exercises: Educated pt and husband on FM/GM activties sheet and theraputty activity sheet--going through each of the activities with her with husband present. Pt recommended to do FM/GM activites 3x/day picking 4 different ones each time and to do the theraputty sequence 2x/day. Yellow theraputty, deck of cards, word search, and dot-to-dot  handouts given to her.           Pertinent Vitals/ Pain       Pain Assessment: No/denies pain         Frequency  Min 3X/week        Progress Toward Goals  OT Goals(current goals can now be found in the care plan section)  Progress towards OT goals: Progressing toward goals     Plan Discharge plan needs to be updated       AM-PAC OT "  6 Clicks" Daily Activity     Outcome Measure   Help from another person eating meals?: None Help from another person taking care of personal grooming?: A Little Help from another person toileting, which includes using toliet, bedpan, or urinal?: A Little Help from another person bathing (including washing, rinsing, drying)?: A Little Help from another person to put on and taking off regular upper body clothing?: A Little Help from another person to put on and taking off regular lower body clothing?: A Little 6 Click Score: 19    End of Session Equipment Utilized During Treatment: Gait belt  OT Visit Diagnosis: Unsteadiness on feet (R26.81);Other abnormalities of  gait and mobility (R26.89);Muscle weakness (generalized) (M62.81);Other symptoms and signs involving cognitive function;Hemiplegia and hemiparesis Hemiplegia - Right/Left: Right Hemiplegia - dominant/non-dominant: Dominant Hemiplegia - caused by: Cerebral infarction   Activity Tolerance Patient tolerated treatment well   Patient Left with bed alarm set;with family/visitor present(sitting EOB eating breakfast)           Time: 9977-4142 OT Time Calculation (min): 52 min  Charges: OT General Charges $OT Visit: 1 Visit OT Treatments $Self Care/Home Management : 8-22 mins $Therapeutic Exercise: 23-37 mins  Ignacia Palma, OTR/L Acute Rehab Services Pager [phone] Office [phone]      Evette Georges 04/23/2018, 8:52 AM

## 2018-04-23 NOTE — Progress Notes (Signed)
Patient assisted to bathroom, refusing to use front wheel rolling walker for ambulation.  Also states, "I can't clean myself, so you will have to do it."  Husband in room and attempted to educate re:  Home health needs of patient, but did not participate in care.

## 2018-04-25 ENCOUNTER — Ambulatory Visit: Payer: Self-pay | Admitting: *Deleted

## 2018-04-25 ENCOUNTER — Ambulatory Visit (INDEPENDENT_AMBULATORY_CARE_PROVIDER_SITE_OTHER): Payer: BLUE CROSS/BLUE SHIELD | Admitting: Nurse Practitioner

## 2018-04-25 ENCOUNTER — Other Ambulatory Visit: Payer: Self-pay

## 2018-04-25 ENCOUNTER — Encounter: Payer: Self-pay | Admitting: Nurse Practitioner

## 2018-04-25 VITALS — BP 128/74 | HR 69 | Temp 98.2°F | Ht 65.0 in | Wt 233.0 lb

## 2018-04-25 DIAGNOSIS — I63232 Cerebral infarction due to unspecified occlusion or stenosis of left carotid arteries: Secondary | ICD-10-CM | POA: Diagnosis not present

## 2018-04-25 DIAGNOSIS — Z1329 Encounter for screening for other suspected endocrine disorder: Secondary | ICD-10-CM | POA: Diagnosis not present

## 2018-04-25 DIAGNOSIS — D649 Anemia, unspecified: Secondary | ICD-10-CM | POA: Diagnosis not present

## 2018-04-25 DIAGNOSIS — I10 Essential (primary) hypertension: Secondary | ICD-10-CM

## 2018-04-25 MED ORDER — LISINOPRIL 5 MG PO TABS: 5.0000 mg | ORAL_TABLET | Freq: Every day | ORAL | 3 refills | Status: DC

## 2018-04-25 NOTE — Chronic Care Management (AMB) (Signed)
  Chronic Care Management   Face to Face Consult Note  04/25/2018 Name: Kristin Solis MRN: [id number] DOB: 1955/10/16  Referred by: [name], NP Reason for referral : Chronic Care Management (New Enrollment)  Kristin Solis is a 63 y.o. year old female who sees [place], Henrine Screws T, NP for primary care. The CCM team was consulted for assistance with chronic disease management related to CVA. I met with Kristin Solis and her husband face to face.    Goals Addressed    . "I need help getting therapy" (pt-stated)       Current Barriers:  [name] Knowledge Deficits related to options for therapy post CVa . Film/video editor.   Nurse Case Manager Clinical Goal(s):  [name] Over the next 30 days, patient will verbalize understanding of plan for outpatient therapy options  Interventions:  . Evaluation of current treatment plan related to post CVA therapy needs and patient's adherence to plan as established by provider. . Discussed plans with patient for ongoing care management follow up and provided patient with direct contact information for care management team  Patient Self Care Activities:  . Currently UNABLE TO independently perform ADL's including self care activities such as bathing, dressing because of stroke deficits   Plan:  . RNCM will investigate health plan coverage options and resources for outpatient therapy  Initial goal documentation      Follow Up Plan: RNCM will investigate outpatient therapy options and follow up with patient by phone over next 48 hours  [place] [facility] [place] [facility] [phone]

## 2018-04-25 NOTE — Assessment & Plan Note (Addendum)
Recent hospitalization, overall reports feeling better.  Unable to afford therapy.  Have placed CCM referral for assistance.  Continue discharge medications and collaboration with neurology.

## 2018-04-25 NOTE — Patient Instructions (Addendum)
Stroke Prevention Some medical conditions and lifestyle choices can lead to a higher risk for a stroke. You can help to prevent a stroke by making nutrition, lifestyle, and other changes. What nutrition changes can be made?   Eat healthy foods. ? Choose foods that are high in fiber. These include:  Fresh fruits.  Fresh vegetables.  Whole grains. ? Eat at least 5 or more servings of fruits and vegetables each day. Try to fill half of your plate at each meal with fruits and vegetables. ? Choose lean protein foods. These include:  Lowfat (lean) cuts of meat.  Chicken without skin.  Fish.  Tofu.  Beans.  Nuts. ? Eat low-fat dairy products. ? Avoid foods that:  Are high in salt (sodium).  Have saturated fat.  Have trans fat.  Have cholesterol.  Are processed.  Are premade.  Follow eating guidelines as told by your doctor. These may include: ? Reducing how many calories you eat and drink each day. ? Limiting how much salt you eat or drink each day to 1,500 milligrams (mg). ? Using only healthy fats for cooking. These include:  Olive oil.  Canola oil.  Sunflower oil. ? Counting how many carbohydrates you eat and drink each day. What lifestyle changes can be made?  Try to stay at a healthy weight. Talk to your doctor about what a good weight is for you.  Get at least 30 minutes of moderate physical activity at least 5 days a week. This can include: ? Fast walking. ? Biking. ? Swimming.  Do not use any products that have nicotine or tobacco. This includes cigarettes and e-cigarettes. If you need help quitting, ask your doctor. Avoid being around tobacco smoke in general.  Limit how much alcohol you drink to no more than 1 drink a day for nonpregnant women and 2 drinks a day for men. One drink equals 12 oz of beer, 5 oz of wine, or 1 oz of hard liquor.  Do not use drugs.  Avoid taking birth control pills. Talk to your doctor about the risks of taking birth  control pills if: ? You are over 35 years old. ? You smoke. ? You get migraines. ? You have had a blood clot. What other changes can be made?  Manage your cholesterol. ? It is important to eat a healthy diet. ? If your cholesterol cannot be managed through your diet, you may also need to take medicines. Take medicines as told by your doctor.  Manage your diabetes. ? It is important to eat a healthy diet and to exercise regularly. ? If your blood sugar cannot be managed through diet and exercise, you may need to take medicines. Take medicines as told by your doctor.  Control your high blood pressure (hypertension). ? Try to keep your blood pressure below 130/80. This can help lower your risk of stroke. ? It is important to eat a healthy diet and to exercise regularly. ? If your blood pressure cannot be managed through diet and exercise, you may need to take medicines. Take medicines as told by your doctor. ? Ask your doctor if you should check your blood pressure at home. ? Have your blood pressure checked every year. Do this even if your blood pressure is normal.  Talk to your doctor about getting checked for a sleep disorder. Signs of this can include: ? Snoring a lot. ? Feeling very tired.  Take over-the-counter and prescription medicines only as told by your doctor. These may   include aspirin or blood thinners (antiplatelets or anticoagulants).  Make sure that any other medical conditions you have are managed. Where to find more information  American Stroke Association: www.strokeassociation.org  National Stroke Association: www.stroke.org Get help right away if:  You have any symptoms of stroke. "BE FAST" is an easy way to remember the main warning signs: ? B - Balance. Signs are dizziness, sudden trouble walking, or loss of balance. ? E - Eyes. Signs are trouble seeing or a sudden change in how you see. ? F - Face. Signs are sudden weakness or loss of feeling of the face,  or the face or eyelid drooping on one side. ? A - Arms. Signs are weakness or loss of feeling in an arm. This happens suddenly and usually on one side of the body. ? S - Speech. Signs are sudden trouble speaking, slurred speech, or trouble understanding what people say. ? T - Time. Time to call emergency services. Write down what time symptoms started.  You have other signs of stroke, such as: ? A sudden, very bad headache with no known cause. ? Feeling sick to your stomach (nausea). ? Throwing up (vomiting). ? Jerky movements you cannot control (seizure). These symptoms may represent a serious problem that is an emergency. Do not wait to see if the symptoms will go away. Get medical help right away. Call your local emergency services (911 in the [place] Do not drive yourself to the hospital. Summary  You can prevent a stroke by eating healthy, exercising, not smoking, drinking less alcohol, and treating other health problems, such as diabetes, high blood pressure, or high cholesterol.  Do not use any products that contain nicotine or tobacco, such as cigarettes and e-cigarettes.  Get help right away if you have any signs or symptoms of a stroke. This information is not intended to replace advice given to you by your health care provider. Make sure you discuss any questions you have with your health care provider. Document Released: 08/04/2011 Document Revised: 05/06/2016 Document Reviewed: 05/06/2016 Elsevier Interactive Patient Education  2019 [employer]  DASH Eating Plan DASH stands for "Dietary Approaches to Stop Hypertension." The DASH eating plan is a healthy eating plan that has been shown to reduce high blood pressure (hypertension). It may also reduce your risk for type 2 diabetes, heart disease, and stroke. The DASH eating plan may also help with weight loss. What are tips for following this plan?  General guidelines  Avoid eating more than 2,300 mg (milligrams) of salt  (sodium) a day. If you have hypertension, you may need to reduce your sodium intake to 1,500 mg a day.  Limit alcohol intake to no more than 1 drink a day for nonpregnant women and 2 drinks a day for men. One drink equals 12 oz of beer, 5 oz of wine, or 1 oz of hard liquor.  Work with your health care provider to maintain a healthy body weight or to lose weight. Ask what an ideal weight is for you.  Get at least 30 minutes of exercise that causes your heart to beat faster (aerobic exercise) most days of the week. Activities may include walking, swimming, or biking.  Work with your health care provider or diet and nutrition specialist (dietitian) to adjust your eating plan to your individual calorie needs. Reading food labels   Check food labels for the amount of sodium per serving. Choose foods with less than 5 percent of the Daily Value of sodium. Generally, foods  with less than 300 mg of sodium per serving fit into this eating plan.  To find whole grains, look for the word "whole" as the first word in the ingredient list. Shopping  Buy products labeled as "low-sodium" or "no salt added."  Buy fresh foods. Avoid canned foods and premade or frozen meals. Cooking  Avoid adding salt when cooking. Use salt-free seasonings or herbs instead of table salt or sea salt. Check with your health care provider or pharmacist before using salt substitutes.  Do not fry foods. Cook foods using healthy methods such as baking, boiling, grilling, and broiling instead.  Cook with heart-healthy oils, such as olive, canola, soybean, or sunflower oil. Meal planning  Eat a balanced diet that includes: ? 5 or more servings of fruits and vegetables each day. At each meal, try to fill half of your plate with fruits and vegetables. ? Up to 6-8 servings of whole grains each day. ? Less than 6 oz of lean meat, poultry, or fish each day. A 3-oz serving of meat is about the same size as a deck of cards. One egg  equals 1 oz. ? 2 servings of low-fat dairy each day. ? A serving of nuts, seeds, or beans 5 times each week. ? Heart-healthy fats. Healthy fats called Omega-3 fatty acids are found in foods such as flaxseeds and coldwater fish, like sardines, salmon, and mackerel.  Limit how much you eat of the following: ? Canned or prepackaged foods. ? Food that is high in trans fat, such as fried foods. ? Food that is high in saturated fat, such as fatty meat. ? Sweets, desserts, sugary drinks, and other foods with added sugar. ? Full-fat dairy products.  Do not salt foods before eating.  Try to eat at least 2 vegetarian meals each week.  Eat more home-cooked food and less restaurant, buffet, and fast food.  When eating at a restaurant, ask that your food be prepared with less salt or no salt, if possible. What foods are recommended? The items listed may not be a complete list. Talk with your dietitian about what dietary choices are best for you. Grains Whole-grain or whole-wheat bread. Whole-grain or whole-wheat pasta. Brown rice. Orpah Cobb. Bulgur. Whole-grain and low-sodium cereals. Pita bread. Low-fat, low-sodium crackers. Whole-wheat flour tortillas. Vegetables Fresh or frozen vegetables (raw, steamed, roasted, or grilled). Low-sodium or reduced-sodium tomato and vegetable juice. Low-sodium or reduced-sodium tomato sauce and tomato paste. Low-sodium or reduced-sodium canned vegetables. Fruits All fresh, dried, or frozen fruit. Canned fruit in natural juice (without added sugar). Meat and other protein foods Skinless chicken or [place]. Ground chicken or [place]. Pork with fat trimmed off. Fish and seafood. Egg whites. Dried beans, peas, or lentils. Unsalted nuts, nut butters, and seeds. Unsalted canned beans. Lean cuts of beef with fat trimmed off. Low-sodium, lean deli meat. Dairy Low-fat (1%) or fat-free (skim) milk. Fat-free, low-fat, or reduced-fat cheeses. Nonfat, low-sodium ricotta or  cottage cheese. Low-fat or nonfat yogurt. Low-fat, low-sodium cheese. Fats and oils Soft margarine without trans fats. Vegetable oil. Low-fat, reduced-fat, or light mayonnaise and salad dressings (reduced-sodium). Canola, safflower, olive, soybean, and sunflower oils. Avocado. Seasoning and other foods Herbs. Spices. Seasoning mixes without salt. Unsalted popcorn and pretzels. Fat-free sweets. What foods are not recommended? The items listed may not be a complete list. Talk with your dietitian about what dietary choices are best for you. Grains Baked goods made with fat, such as croissants, muffins, or some breads. Dry pasta or rice meal  packs. Vegetables Creamed or fried vegetables. Vegetables in a cheese sauce. Regular canned vegetables (not low-sodium or reduced-sodium). Regular canned tomato sauce and paste (not low-sodium or reduced-sodium). Regular tomato and vegetable juice (not low-sodium or reduced-sodium). Rosita Fire. Olives. Fruits Canned fruit in a light or heavy syrup. Fried fruit. Fruit in cream or butter sauce. Meat and other protein foods Fatty cuts of meat. Ribs. Fried meat. Tomasa Blase. Sausage. Bologna and other processed lunch meats. Salami. Fatback. Hotdogs. Bratwurst. Salted nuts and seeds. Canned beans with added salt. Canned or smoked fish. Whole eggs or egg yolks. Chicken or [place] with skin. Dairy Whole or 2% milk, cream, and half-and-half. Whole or full-fat cream cheese. Whole-fat or sweetened yogurt. Full-fat cheese. Nondairy creamers. Whipped toppings. Processed cheese and cheese spreads. Fats and oils Butter. Stick margarine. Lard. Shortening. Ghee. Bacon fat. Tropical oils, such as coconut, palm kernel, or palm oil. Seasoning and other foods Salted popcorn and pretzels. Onion salt, garlic salt, seasoned salt, table salt, and sea salt. Worcestershire sauce. Tartar sauce. Barbecue sauce. Teriyaki sauce. Soy sauce, including reduced-sodium. Steak sauce. Canned and packaged  gravies. Fish sauce. Oyster sauce. Cocktail sauce. Horseradish that you find on the shelf. Ketchup. Mustard. Meat flavorings and tenderizers. Bouillon cubes. Hot sauce and Tabasco sauce. Premade or packaged marinades. Premade or packaged taco seasonings. Relishes. Regular salad dressings. Where to find more information:  National Heart, Lung, and Blood Institute: PopSteam.is  American Heart Association: www.heart.org Summary  The DASH eating plan is a healthy eating plan that has been shown to reduce high blood pressure (hypertension). It may also reduce your risk for type 2 diabetes, heart disease, and stroke.  With the DASH eating plan, you should limit salt (sodium) intake to 2,300 mg a day. If you have hypertension, you may need to reduce your sodium intake to 1,500 mg a day.  When on the DASH eating plan, aim to eat more fresh fruits and vegetables, whole grains, lean proteins, low-fat dairy, and heart-healthy fats.  Work with your health care provider or diet and nutrition specialist (dietitian) to adjust your eating plan to your individual calorie needs. This information is not intended to replace advice given to you by your health care provider. Make sure you discuss any questions you have with your health care provider. Document Released: 01/22/2011 Document Revised: 01/27/2016 Document Reviewed: 01/27/2016 Elsevier Interactive Patient Education  2019 [employer].

## 2018-04-25 NOTE — Progress Notes (Signed)
BP 128/74 (BP Location: Left Arm, Patient Position: Sitting)   Pulse 69   Temp 98.2 F (36.8 C) (Oral)   Ht  (1.651 m)   Wt 233 lb (105.7 kg)   SpO2 97%   BMI 38.77 kg/m    Subjective:    Patient ID: Kristin Solis, female    DOB: 04-12-1955, 64 y.o.   MRN: [id number]  HPI: Kristin Solis is a 64 y.o. female  Chief Complaint  Patient presents with  . Hospitalization Follow-up   Transition of Care Hospital Follow up.   Hospital/Facility: [facility] D/C Physician: Dr. Roda Shutters D/C Date: 04/23/2018  Records Requested: in EPIC Records Received: 04/24/18 Records Reviewed: 04/25/18  Diagnoses on Discharge:  Left MCA infarct s/p TPA and IR with ICA rescue stent placement -- mild right side deficits  Date of interactive Contact within 48 hours of discharge: 04/25/18 Contact was through: direct  Date of 7 day or 14 day face-to-face visit:    within 7 days  Outpatient Encounter Medications as of 04/25/2018  Medication Sig  . aspirin EC 81 MG EC tablet Take 1 tablet (81 mg total) by mouth daily.  [name] atorvastatin (LIPITOR) 40 MG tablet Take 1 tablet (40 mg total) by mouth daily at 6 PM.  . Fluticasone-Salmeterol (ADVAIR DISKUS) 250-50 MCG/DOSE AEPB Inhale 1 puff into the lungs 2 (two) times daily.  . ticagrelor (BRILINTA) 90 MG TABS tablet Take 1 tablet (90 mg total) by mouth 2 (two) times daily.  [name] albuterol (PROVENTIL HFA;VENTOLIN HFA) 108 (90 Base) MCG/ACT inhaler Inhale 2 puffs into the lungs every 6 (six) hours as needed for wheezing or shortness of breath. (Patient not taking: Reported on 04/25/2018)  . fluticasone (FLONASE) 50 MCG/ACT nasal spray Place 2 sprays into both nostrils daily. (Patient not taking: Reported on 04/25/2018)  . lisinopril (PRINIVIL,ZESTRIL) 5 MG tablet Take 1 tablet (5 mg total) by mouth daily.  . Maltodextrin-Xanthan Gum (RESOURCE THICKENUP CLEAR) POWD Use as needed to thicken liquids - nectar thick (Patient not taking: Reported on 04/25/2018)   No  facility-administered encounter medications on file as of 04/25/2018.     Diagnostic Tests Reviewed/Disposition: Multiple labs done and imaging as noted  Ct Head Code Stroke Wo Contrast 04/17/2018 1. Mild asymmetric hypoattenuation within the left lateral frontal lobe and anterior insula in comparison with the right which may represent acute stroke. No hemorrhage or mass effect. 2. Small density within left Sylvian fissure, possible MCA branch thrombus. 3. ASPECTS is 8. 4. Several small chronic infarctions in left parietal cortex.   CTA head 04/17/2018 1. Left M1 occlusion. Poor left MCA distribution collateralization. 2. No additional intracranial large vessel occlusion, high-grade stenosis, aneurysm, or vascular malformation.   CTA neck 04/17/2018 1. Left proximal ICA thromboembolism. The ICA downstream to the thrombosis is opacified with contrast suggesting that the thrombus is near occlusive, but not totally occlusive. 2. Patent right carotid system and bilateral vertebral arteries without hemodynamically significant stenosis   Ct Cerebral Perfusion W Contrast 04/17/2018 Left MCA distribution large ischemic penumbra, 77 cc. Left frontal white matter and insula core infarct of 11 cc. ASPECTS 8 infarct on noncontrast CT of head with possible pseudonormalization of perfusion.    MRI brain 04/18/2018 Patchy left MCA branch infarct.  Dg Chest 1 View 04/18/2018 1. Endotracheal tube tip just above the carina. Esophageal tube tip projects over the gastric fundal region and is directed cephalad near the GE junction 2. Streaky left perihilar and basilar atelectasis. Borderline  cardiomegaly   Dg Chest 1 View 04/17/2018 No active disease.   Transthoracic Echocardiogram  04/18/2018 Impressions 1. The left ventricle has hyperdynamic systolic function, with an ejection fraction of >65%. The cavity size was normal. There is moderately increased left ventricular wall thickness. Left ventricular  diastolic Doppler parameters are consistent with  impaired relaxation. 2. The right ventricle has normal systolic function. The cavity was normal. There is no increase in right ventricular wall thickness. 3. The mitral valve is normal in structure. 4. The tricuspid valve is normal in structure. 5. The aortic valve is tricuspid. 6. The pulmonic valve was normal in structure.  Consults: Critical Care  Discharge Instructions: Follow-up with PCP in 2 weeks and follow-up with [facility] in 4 weeks  Disease/illness Education: Educated on stroke and prevention  Home Health/Community Services Discussions/Referrals: None, can not afford  Establishment or re-establishment of referral orders for community resources: CCM referral placed by this provider  Discussion with other health care providers: CCM team  Assessment and Support of treatment regimen adherence: Reviewed with patient  Appointments Coordinated with: CCM team referral for assistance  Education for self-management, independent living, and ADLs:  CCM referral for assistance  HYPERTENSION Not currently on BP meds, discontinued in hospital due to lower BP levels.  Was on Lisinopril 10-HCTZ 12.5 MG.  She denies any further symptoms since treatment in hospital.  Is having difficulty with word finding. Hypertension status: stable  Satisfied with current treatment? yes Duration of hypertension: years BP monitoring frequency:  daily BP range: 140/70 range at home, this morning 119/72  BP medication side effects:  no Medication compliance: good compliance Aspirin: yes Recurrent headaches: no Visual changes: no Palpitations: no Dyspnea: no Chest pain: no Lower extremity edema: no Dizzy/lightheaded: no  Relevant past medical, surgical, family and social history reviewed and updated as indicated. Interim medical history since our last visit reviewed. Allergies and medications reviewed and  updated.  Review of Systems  Constitutional: Negative for activity change, appetite change, diaphoresis, fatigue and fever.  Respiratory: Negative for cough, chest tightness and shortness of breath.   Cardiovascular: Negative for chest pain, palpitations and leg swelling.  Gastrointestinal: Negative for abdominal distention, abdominal pain, constipation, diarrhea, nausea and vomiting.  Endocrine: Negative for cold intolerance, heat intolerance, polydipsia, polyphagia and polyuria.  Neurological: Negative for dizziness, syncope, weakness, light-headedness, numbness and headaches.  Psychiatric/Behavioral: Negative.     Per HPI unless specifically indicated above     Objective:    BP 128/74 (BP Location: Left Arm, Patient Position: Sitting)   Pulse 69   Temp 98.2 F (36.8 C) (Oral)   Ht  (1.651 m)   Wt 233 lb (105.7 kg)   SpO2 97%   BMI 38.77 kg/m   Wt Readings from Last 3 Encounters:  04/25/18 233 lb (105.7 kg)  04/21/18 245 lb 9.5 oz (111.4 kg)  04/17/18 247 lb (112 kg)    Physical Exam Vitals signs and nursing note reviewed.  Constitutional:      Appearance: She is well-developed.  HENT:     Head: Normocephalic.  Eyes:     General: Lids are normal.        Right eye: No discharge.        Left eye: No discharge.     Conjunctiva/sclera: Conjunctivae normal.     Pupils: Pupils are equal, round, and reactive to light.  Neck:     Musculoskeletal: Normal range of motion and neck supple.  Thyroid: No thyromegaly.     Vascular: No carotid bruit or JVD.  Cardiovascular:     Rate and Rhythm: Normal rate and regular rhythm.     Heart sounds: Normal heart sounds.  Pulmonary:     Effort: Pulmonary effort is normal.     Breath sounds: Normal breath sounds.  Abdominal:     General: Bowel sounds are normal.     Palpations: Abdomen is soft.  Lymphadenopathy:     Cervical: No cervical adenopathy.  Skin:    General: Skin is warm and dry.  Neurological:     Mental  Status: She is alert and oriented to person, place, and time.     Gait: Gait is intact.     Deep Tendon Reflexes:     Reflex Scores:      Brachioradialis reflexes are 1+ on the right side and 2+ on the left side.      Patellar reflexes are 1+ on the right side and 2+ on the left side.    Comments: Decreased strength right 4/5 versus left.  Uses walker with no gait disturbance noted.    Psychiatric:        Attention and Perception: Attention normal.        Mood and Affect: Mood normal.        Speech: Speech normal.        Behavior: Behavior normal.        Thought Content: Thought content normal.        Judgment: Judgment normal.     Comments: Normal speech with some difficulty word finding.       Results for orders placed or performed during the hospital encounter of 04/17/18  MRSA PCR Screening  Result Value Ref Range   MRSA by PCR NEGATIVE NEGATIVE  HIV antibody (Routine Testing)  Result Value Ref Range   HIV Screen 4th Generation wRfx Non Reactive Non Reactive  Hemoglobin A1c  Result Value Ref Range   Hgb A1c MFr Bld 5.7 (H) 4.8 - 5.6 %   Mean Plasma Glucose 116.89 mg/dL  Lipid panel  Result Value Ref Range   Cholesterol 186 0 - 200 mg/dL   Triglycerides 90 <161 mg/dL   HDL 66 >09 mg/dL   Total CHOL/HDL Ratio 2.8 RATIO   VLDL 18 0 - 40 mg/dL   LDL Cholesterol 604 (H) 0 - 99 mg/dL  CBC with Differential/Platelet  Result Value Ref Range   WBC 12.9 (H) 4.0 - 10.5 K/uL   RBC 4.60 3.87 - 5.11 MIL/uL   Hemoglobin 13.1 12.0 - 15.0 g/dL   HCT 54.0 98.1 - 19.1 %   MCV 88.3 80.0 - 100.0 fL   MCH 28.5 26.0 - 34.0 pg   MCHC 32.3 30.0 - 36.0 g/dL   RDW 47.8 29.5 - 62.1 %   Platelets 327 150 - 400 K/uL   nRBC 0.0 0.0 - 0.2 %   Neutrophils Relative % 93 %   Neutro Abs 12.0 (H) 1.7 - 7.7 K/uL   Lymphocytes Relative 4 %   Lymphs Abs 0.5 (L) 0.7 - 4.0 K/uL   Monocytes Relative 2 %   Monocytes Absolute 0.2 0.1 - 1.0 K/uL   Eosinophils Relative 0 %   Eosinophils Absolute 0.0 0.0  - 0.5 K/uL   Basophils Relative 0 %   Basophils Absolute 0.0 0.0 - 0.1 K/uL   Immature Granulocytes 1 %   Abs Immature Granulocytes 0.10 (H) 0.00 - 0.07 K/uL  Basic metabolic panel  Result Value Ref Range   Sodium 136 135 - 145 mmol/L   Potassium 3.3 (L) 3.5 - 5.1 mmol/L   Chloride 106 98 - 111 mmol/L   CO2 23 22 - 32 mmol/L   Glucose, Bld 162 (H) 70 - 99 mg/dL   BUN 16 8 - 23 mg/dL   Creatinine, Ser 4.15 0.44 - 1.00 mg/dL   Calcium 8.3 (L) 8.9 - 10.3 mg/dL   GFR calc non Af Amer >60 >60 mL/min   GFR calc Af Amer >60 >60 mL/min   Anion gap 7 5 - 15  Triglycerides  Result Value Ref Range   Triglycerides 97 <150 mg/dL  CBC  Result Value Ref Range   WBC 9.4 4.0 - 10.5 K/uL   RBC 3.68 (L) 3.87 - 5.11 MIL/uL   Hemoglobin 10.6 (L) 12.0 - 15.0 g/dL   HCT 83.0 (L) 94.0 - 76.8 %   MCV 90.8 80.0 - 100.0 fL   MCH 28.8 26.0 - 34.0 pg   MCHC 31.7 30.0 - 36.0 g/dL   RDW 08.8 11.0 - 31.5 %   Platelets 252 150 - 400 K/uL   nRBC 0.0 0.0 - 0.2 %  Basic metabolic panel  Result Value Ref Range   Sodium 140 135 - 145 mmol/L   Potassium 3.1 (L) 3.5 - 5.1 mmol/L   Chloride 112 (H) 98 - 111 mmol/L   CO2 22 22 - 32 mmol/L   Glucose, Bld 115 (H) 70 - 99 mg/dL   BUN 11 8 - 23 mg/dL   Creatinine, Ser 9.45 0.44 - 1.00 mg/dL   Calcium 8.1 (L) 8.9 - 10.3 mg/dL   GFR calc non Af Amer >60 >60 mL/min   GFR calc Af Amer >60 >60 mL/min   Anion gap 6 5 - 15  CBC with Differential/Platelet  Result Value Ref Range   WBC 11.5 (H) 4.0 - 10.5 K/uL   RBC 4.00 3.87 - 5.11 MIL/uL   Hemoglobin 11.6 (L) 12.0 - 15.0 g/dL   HCT 85.9 29.2 - 44.6 %   MCV 90.0 80.0 - 100.0 fL   MCH 29.0 26.0 - 34.0 pg   MCHC 32.2 30.0 - 36.0 g/dL   RDW 28.6 38.1 - 77.1 %   Platelets 231 150 - 400 K/uL   nRBC 0.0 0.0 - 0.2 %   Neutrophils Relative % 84 %   Neutro Abs 9.8 (H) 1.7 - 7.7 K/uL   Lymphocytes Relative 6 %   Lymphs Abs 0.6 (L) 0.7 - 4.0 K/uL   Monocytes Relative 8 %   Monocytes Absolute 0.9 0.1 - 1.0 K/uL    Eosinophils Relative 1 %   Eosinophils Absolute 0.1 0.0 - 0.5 K/uL   Basophils Relative 0 %   Basophils Absolute 0.0 0.0 - 0.1 K/uL   Immature Granulocytes 1 %   Abs Immature Granulocytes 0.06 0.00 - 0.07 K/uL  Basic metabolic panel  Result Value Ref Range   Sodium 141 135 - 145 mmol/L   Potassium 3.4 (L) 3.5 - 5.1 mmol/L   Chloride 110 98 - 111 mmol/L   CO2 23 22 - 32 mmol/L   Glucose, Bld 93 70 - 99 mg/dL   BUN 14 8 - 23 mg/dL   Creatinine, Ser 1.65 0.44 - 1.00 mg/dL   Calcium 8.5 (L) 8.9 - 10.3 mg/dL   GFR calc non Af Amer >60 >60 mL/min   GFR calc Af Amer >60 >60 mL/min   Anion gap 8 5 - 15  Magnesium  Result Value Ref Range   Magnesium 1.9 1.7 - 2.4 mg/dL  Phosphorus  Result Value Ref Range   Phosphorus 2.2 (L) 2.5 - 4.6 mg/dL  CBC  Result Value Ref Range   WBC 6.1 4.0 - 10.5 K/uL   RBC 4.14 3.87 - 5.11 MIL/uL   Hemoglobin 11.6 (L) 12.0 - 15.0 g/dL   HCT 32.7 61.4 - 70.9 %   MCV 89.6 80.0 - 100.0 fL   MCH 28.0 26.0 - 34.0 pg   MCHC 31.3 30.0 - 36.0 g/dL   RDW 29.5 74.7 - 34.0 %   Platelets 276 150 - 400 K/uL   nRBC 0.0 0.0 - 0.2 %  Basic metabolic panel  Result Value Ref Range   Sodium 140 135 - 145 mmol/L   Potassium 3.4 (L) 3.5 - 5.1 mmol/L   Chloride 106 98 - 111 mmol/L   CO2 26 22 - 32 mmol/L   Glucose, Bld 115 (H) 70 - 99 mg/dL   BUN 13 8 - 23 mg/dL   Creatinine, Ser 3.70 0.44 - 1.00 mg/dL   Calcium 9.0 8.9 - 96.4 mg/dL   GFR calc non Af Amer >60 >60 mL/min   GFR calc Af Amer >60 >60 mL/min   Anion gap 8 5 - 15  ECHOCARDIOGRAM COMPLETE  Result Value Ref Range   Weight 3,952 oz   Height 63 in   BP 106/59 mmHg      Assessment & Plan:   Problem List Items Addressed This Visit      Cardiovascular and Mediastinum   Essential hypertension, benign    Chronic, ongoing.  BP meds discontinued in hospital.  Initial BP today elevated with BP below 130/90 goal.  Having occasional elevations at home.  Will restart Lisinopril at 5 MG and have return in 2  weeks.  To continue daily BP checks at home and document for provider.      Relevant Medications   lisinopril (PRINIVIL,ZESTRIL) 5 MG tablet   Other Relevant Orders   CBC with Differential/Platelet   Comprehensive metabolic panel   Stroke (cerebrum) (HCC) - Primary    Recent hospitalization, overall reports feeling better.  Unable to afford therapy.  Have placed CCM referral for assistance.  Continue discharge medications and collaboration with neurology.      Relevant Medications   lisinopril (PRINIVIL,ZESTRIL) 5 MG tablet   Other Relevant Orders   Ambulatory referral to Chronic Care Management Services    Other Visit Diagnoses    Low hemoglobin       11.6 at discharge, recheck today.   Relevant Orders   CBC with Differential/Platelet   Anemia panel   Thyroid disorder screen       Check TSH   Relevant Orders   TSH       Follow up plan: Return in about 2 weeks (around 05/09/2018) for BP follow-up.

## 2018-04-25 NOTE — Assessment & Plan Note (Signed)
Chronic, ongoing.  BP meds discontinued in hospital.  Initial BP today elevated with BP below 130/90 goal.  Having occasional elevations at home.  Will restart Lisinopril at 5 MG and have return in 2 weeks.  To continue daily BP checks at home and document for provider.

## 2018-04-25 NOTE — Patient Instructions (Signed)
Visit Information  Goals Addressed    . "I need help getting therapy" (pt-stated)       Current Barriers:  [name] Knowledge Deficits related to options for therapy post CVa . Film/video editor.   Nurse Case Manager Clinical Goal(s):  [name] Over the next 30 days, patient will verbalize understanding of plan for outpatient therapy options  Interventions:  . Evaluation of current treatment plan related to post CVA therapy needs and patient's adherence to plan as established by provider. . Discussed plans with patient for ongoing care management follow up and provided patient with direct contact information for care management team  Patient Self Care Activities:  . Currently UNABLE TO independently perform ADL's including self care activities such as bathing, dressing because of stroke deficits   Plan:  . RNCM will investigate health plan coverage options and resources for outpatient therapy  Initial goal documentation       Education or Materials Provided:  . No: Patient declined  Ms. Sula was given information about Chronic Care Management services today including:  1. CCM service includes personalized support from designated clinical staff supervised by her physician, including individualized plan of care and coordination with other care providers 2. 24/7 contact phone numbers for assistance for urgent and routine care needs. 3. Service will only be billed when office clinical staff spend 20 minutes or more in a month to coordinate care. 4. Only one practitioner may furnish and bill the service in a calendar month. 5. The patient may stop CCM services at any time (effective at the end of the month) by phone call to the office staff. 6. The patient will be responsible for cost sharing (co-pay) of up to 20% of the service fee (after annual deductible is met).  Patient agreed to services and verbal consent obtained.   The patient verbalized understanding of instructions provided today  and declined a print copy of patient instruction materials.   Telephone follow up appointment with CCM team member scheduled for: 04/26/18  Janalyn Shy MHA,BSN,RN,CCM Nurse Care Coordinator [facility] / [facility] Care Management [phone]

## 2018-04-26 ENCOUNTER — Ambulatory Visit: Payer: Self-pay | Admitting: *Deleted

## 2018-04-26 DIAGNOSIS — I63232 Cerebral infarction due to unspecified occlusion or stenosis of left carotid arteries: Secondary | ICD-10-CM

## 2018-04-26 NOTE — Chronic Care Management (AMB) (Signed)
  Chronic Care Management   Follow Up Note   04/26/2018 Name: Kristin Solis MRN: [id number] DOB: 1955/08/29  Referred by: [name], NP Reason for referral : Care Coordination (Outpatient PT referral)   FARRIS LANES is a 63 y.o. year old female who is a primary care patient of [name], [name], NP. The CCM team was consulted for assistance with chronic disease management and care coordination needs.    Review of patient status, including review of consultants reports, relevant laboratory and other test results, and collaboration with appropriate care team members and the patient's provider was performed as part of comprehensive patient evaluation and provision of chronic care management services.    Goals Addressed            This Visit's Progress   . "I need help getting therapy" (pt-stated)       Current Barriers:  [name] Knowledge Deficits related to options for therapy post CVa . Corporate treasurer.   Nurse Case Manager Clinical Goal(s):  [name] Over the next 30 days, patient will verbalize understanding of plan for outpatient therapy options  Interventions:  . Evaluation of current treatment plan related to post CVA therapy needs and patient's adherence to plan as established by provider. Steele Sizer with Outpatient PT department at [facility] regarding outpatient PT options/coverage. Request for Outpatient PT referral . Discussed plans with patient for ongoing care management follow up and provided patient with direct contact information for care management team. Notified patient of outpatient PT order and information provided to me by outpatient PT team who said they would call her upon receipt of order.   Patient Self Care Activities:  . Currently UNABLE TO independently perform ADL's including self care activities such as bathing, dressing because of stroke deficits   Plan:  . RNCM will follow up with patient over the next 7 days to ensure  initiation of services or options provided by outpatient PT   Please see past updates related to this goal by clicking on the "Past Updates" button in the selected goal          The CM team will reach out to the patient again over the next 7 days.   Marja Kays MHA,BSN,RN,CCM Nurse Care Coordinator [facility] / [facility] Care Management [phone]

## 2018-04-26 NOTE — Patient Instructions (Signed)
Visit Information  Goals Addressed            This Visit's Progress   . "I need help getting therapy" (pt-stated)       Current Barriers:  [name] Knowledge Deficits related to options for therapy post CVa . Corporate treasurer.   Nurse Case Manager Clinical Goal(s):  [name] Over the next 30 days, patient will verbalize understanding of plan for outpatient therapy options  Interventions:  . Evaluation of current treatment plan related to post CVA therapy needs and patient's adherence to plan as established by provider. Steele Sizer with Outpatient PT department at [facility] regarding outpatient PT options/coverage. Request for Outpatient PT referral . Discussed plans with patient for ongoing care management follow up and provided patient with direct contact information for care management team. Notified patient of outpatient PT order and information provided to me by outpatient PT team who said they would call her upon receipt of order.   Patient Self Care Activities:  . Currently UNABLE TO independently perform ADL's including self care activities such as bathing, dressing because of stroke deficits   Plan:  . RNCM will follow up with patient over the next 7 days to ensure initiation of services or options provided by outpatient PT   Please see past updates related to this goal by clicking on the "Past Updates" button in the selected goal         Education or Materials Provided:  . No: Patient declined  The patient verbalized understanding of instructions provided today and declined a print copy of patient instruction materials.   The CM team will reach out to the patient again over the next 7 days.   Marja Kays MHA,BSN,RN,CCM Nurse Care Coordinator [facility] / [facility] Care Management [phone]

## 2018-04-27 ENCOUNTER — Other Ambulatory Visit: Payer: Self-pay | Admitting: Nurse Practitioner

## 2018-04-27 DIAGNOSIS — I63232 Cerebral infarction due to unspecified occlusion or stenosis of left carotid arteries: Secondary | ICD-10-CM

## 2018-04-29 ENCOUNTER — Telehealth: Payer: Self-pay | Admitting: *Deleted

## 2018-04-29 ENCOUNTER — Other Ambulatory Visit: Payer: Self-pay

## 2018-05-02 ENCOUNTER — Telehealth: Payer: Self-pay

## 2018-05-03 ENCOUNTER — Ambulatory Visit: Payer: Self-pay | Admitting: *Deleted

## 2018-05-03 ENCOUNTER — Other Ambulatory Visit: Payer: Self-pay

## 2018-05-03 DIAGNOSIS — I63232 Cerebral infarction due to unspecified occlusion or stenosis of left carotid arteries: Secondary | ICD-10-CM

## 2018-05-03 NOTE — Chronic Care Management (AMB) (Signed)
  Chronic Care Management   Follow Up Note   05/03/2018 Name: Kristin Solis MRN: [id number] DOB: [birth year]  Referred by: [name], NP Reason for referral : Chronic Care Management (Telephone f/u about PT)   Kristin Solis is a 63 y.o. year old female who is a primary care patient of [name], [name], NP. The CCM team was consulted for assistance with chronic disease management and care coordination needs.    Review of patient status, including review of consultants reports, relevant laboratory and other test results, and collaboration with appropriate care team members and the patient's provider was performed as part of comprehensive patient evaluation and provision of chronic care management services.    Goals Addressed    . "I need help getting therapy" (pt-stated)       Current Barriers:  [name] Knowledge Deficits related to options for therapy post CVa . Corporate treasurer.   Nurse Case Manager Clinical Goal(s):  [name] Over the next 30 days, patient will verbalize understanding of plan for outpatient therapy options  Interventions:  . Evaluation of current treatment plan related to post CVA therapy needs and patient's adherence to plan as established by provider. Steele Sizer with Outpatient PT department at [facility] regarding outpatient PT options/coverage. Request for Outpatient PT referral. Made a call to outpatient PT at [facility] to f/u on sent referral. Eunice Blase stated patient will be called tomorrow 3/18 to set up outpatient PT.  [name] Discussed plans with patient for ongoing care management follow up and provided patient with direct contact information for care management team. Notified patient of outpatient PT order and information provided to me by outpatient PT team who said they would call her upon receipt of order.   Patient Self Care Activities:  . Currently UNABLE TO independently perform ADL's including self care activities such as  bathing, dressing because of stroke deficits   Plan:  . RNCM will follow up with patient over the next 7 days to ensure initiation of services or options provided by outpatient PT   Please see past updates related to this goal by clicking on the "Past Updates" button in the selected goal      . "I want to improve my ability to find my words" (pt-stated)       Current Barriers:  . Cognitive Deficits related to recent CVA.  Nurse Case Manager Clinical Goal(s):  [name] Over the next 30 days, patient will work with [name] to address needs related to decreased ability to communitcate related to recent CVA.  Interventions:  . Provided education to patient re: How to improve cogition and speech after CVA.  Patient Self Care Activities:  . Currently UNABLE TO independently perform IADLs and ADLs   Initial goal documentation       Telephone follow up appointment with CCM team member scheduled for: 1 week to make sure patient was able to attend outpatient PT and received mailed CVA education.    Ma Rings Indira Sorenson RN, BSN Nurse Case Education officer, community Family Practice/[facility]  [phone]) Business [employer]

## 2018-05-03 NOTE — Patient Instructions (Signed)
Visit Information  Goals Addressed            This Visit's Progress   . "I need help getting therapy" (pt-stated)       Current Barriers:  [name] Knowledge Deficits related to options for therapy post CVa . Corporate treasurer.   Nurse Case Manager Clinical Goal(s):  [name] Over the next 30 days, patient will verbalize understanding of plan for outpatient therapy options  Interventions:  . Evaluation of current treatment plan related to post CVA therapy needs and patient's adherence to plan as established by provider. Steele Sizer with Outpatient PT department at [facility] regarding outpatient PT options/coverage. Request for Outpatient PT referral. Made a call to outpatient PT at [facility] to f/u on sent referral. Eunice Blase stated patient will be called tomorrow 3/18 to set up outpatient PT.  [name] Discussed plans with patient for ongoing care management follow up and provided patient with direct contact information for care management team. Notified patient of outpatient PT order and information provided to me by outpatient PT team who said they would call her upon receipt of order.   Patient Self Care Activities:  . Currently UNABLE TO independently perform ADL's including self care activities such as bathing, dressing because of stroke deficits   Plan:  . RNCM will follow up with patient over the next 7 days to ensure initiation of services or options provided by outpatient PT   Please see past updates related to this goal by clicking on the "Past Updates" button in the selected goal      . "I want to improve my ability to find my words" (pt-stated)       Current Barriers:  . Cognitive Deficits related to recent CVA.  Nurse Case Manager Clinical Goal(s):  [name] Over the next 30 days, patient will work with Mrs. Bustamante to address needs related to decreased ability to communitcate related to recent CVA.  Interventions:  . Provided education to patient re: How to  improve cogition and speech after CVA.  Patient Self Care Activities:  . Currently UNABLE TO independently perform IADLs and ADLs   Initial goal documentation        The patient verbalized understanding of instructions provided today and declined a print copy of patient instruction materials.   Telephone follow up appointment with CCM team member scheduled for:1 week  Deyon Chizek RN, BSN Nurse Case Manager [facility]  [phone]) Business Mobile

## 2018-05-05 ENCOUNTER — Ambulatory Visit: Payer: Self-pay | Admitting: Licensed Clinical Social Worker

## 2018-05-05 NOTE — Chronic Care Management (AMB) (Signed)
  Chronic Care Management    Clinical Social Work CCM Outreach Note  05/05/2018 Name: Kristin Solis MRN: [id number] DOB: 29-Sep-1955  Kristin Solis is a 63 y.o. year old female who is a primary care patient of [name], [name], NP . The CCM team was consulted for assistance with [employer].   LCSW reached out to [name] today by phone to introduce and offer CCM services. LCSW unable to reach patient successfully but left a HIPPA compliant voice message encouraging a return call once available. LCSW will make an additional outreach attempt if no return call has been completed.   Follow Up Plan: SW will follow up with patient by phone over the next 14 days  Dickie La, BSW, MSW, LCSW [facility] [place]  [facility] [email or web address] Phone: [phone]

## 2018-05-09 ENCOUNTER — Telehealth (INDEPENDENT_AMBULATORY_CARE_PROVIDER_SITE_OTHER): Payer: BLUE CROSS/BLUE SHIELD | Admitting: Nurse Practitioner

## 2018-05-09 ENCOUNTER — Other Ambulatory Visit: Payer: Self-pay

## 2018-05-09 VITALS — BP 135/73 | HR 61 | Temp 98.1°F | Ht 63.0 in | Wt 223.0 lb

## 2018-05-09 DIAGNOSIS — I10 Essential (primary) hypertension: Secondary | ICD-10-CM

## 2018-05-09 NOTE — Progress Notes (Signed)
BP 135/73   Pulse 61   Temp 98.1 F (36.7 C) (Oral)   Ht  (1.6 m)   Wt 223 lb (101.2 kg)   BMI 39.50 kg/m    Subjective:    Patient ID: Kristin Solis, female    DOB: 07/10/1955, 63 y.o.   MRN: [id number]  HPI: CAMBER NINH is a 63 y.o. female  CC: BP FOLLOW-UP  TELEPHONE FOLLOW-UP ENCOUNTER This follow-up procedure has been fully reviewed with the patient and verbal consent has been obtained.  HYPERTENSION Restarted at last visit on Lisinopril 5 MG.  Reports blood pressure has improved and denies any concerns. Hypertension status: stable  Satisfied with current treatment? yes Duration of hypertension: years BP monitoring frequency:  daily BP range: 135/73 this morning, range has been 130-140/70 BP medication side effects:  no Medication compliance: good compliance Aspirin: yes Recurrent headaches: no Visual changes: no Palpitations: no Dyspnea: no Chest pain: no Lower extremity edema: no Dizzy/lightheaded: no  Relevant past medical, surgical, family and social history reviewed and updated as indicated. Interim medical history since our last visit reviewed. Allergies and medications reviewed and updated.  Review of Systems  Per HPI unless specifically indicated above     Objective:    BP 135/73   Pulse 61   Temp 98.1 F (36.7 C) (Oral)   Ht  (1.6 m)   Wt 223 lb (101.2 kg)   BMI 39.50 kg/m   Wt Readings from Last 3 Encounters:  05/09/18 223 lb (101.2 kg)  04/25/18 233 lb (105.7 kg)  04/21/18 245 lb 9.5 oz (111.4 kg)    Physical Exam  Deferred due to phone, unable to obtain visual of patient.    Results for orders placed or performed during the hospital encounter of 04/17/18  MRSA PCR Screening  Result Value Ref Range   MRSA by PCR NEGATIVE NEGATIVE  HIV antibody (Routine Testing)  Result Value Ref Range   HIV Screen 4th Generation wRfx Non Reactive Non Reactive  Hemoglobin A1c  Result Value Ref Range   Hgb A1c MFr Bld 5.7 (H)  4.8 - 5.6 %   Mean Plasma Glucose 116.89 mg/dL  Lipid panel  Result Value Ref Range   Cholesterol 186 0 - 200 mg/dL   Triglycerides 90 <829 mg/dL   HDL 66 >56 mg/dL   Total CHOL/HDL Ratio 2.8 RATIO   VLDL 18 0 - 40 mg/dL   LDL Cholesterol 213 (H) 0 - 99 mg/dL  CBC with Differential/Platelet  Result Value Ref Range   WBC 12.9 (H) 4.0 - 10.5 K/uL   RBC 4.60 3.87 - 5.11 MIL/uL   Hemoglobin 13.1 12.0 - 15.0 g/dL   HCT 08.6 57.8 - 46.9 %   MCV 88.3 80.0 - 100.0 fL   MCH 28.5 26.0 - 34.0 pg   MCHC 32.3 30.0 - 36.0 g/dL   RDW 62.9 52.8 - 41.3 %   Platelets 327 150 - 400 K/uL   nRBC 0.0 0.0 - 0.2 %   Neutrophils Relative % 93 %   Neutro Abs 12.0 (H) 1.7 - 7.7 K/uL   Lymphocytes Relative 4 %   Lymphs Abs 0.5 (L) 0.7 - 4.0 K/uL   Monocytes Relative 2 %   Monocytes Absolute 0.2 0.1 - 1.0 K/uL   Eosinophils Relative 0 %   Eosinophils Absolute 0.0 0.0 - 0.5 K/uL   Basophils Relative 0 %   Basophils Absolute 0.0 0.0 - 0.1 K/uL   Immature Granulocytes 1 %  Abs Immature Granulocytes 0.10 (H) 0.00 - 0.07 K/uL  Basic metabolic panel  Result Value Ref Range   Sodium 136 135 - 145 mmol/L   Potassium 3.3 (L) 3.5 - 5.1 mmol/L   Chloride 106 98 - 111 mmol/L   CO2 23 22 - 32 mmol/L   Glucose, Bld 162 (H) 70 - 99 mg/dL   BUN 16 8 - 23 mg/dL   Creatinine, Ser 9.16 0.44 - 1.00 mg/dL   Calcium 8.3 (L) 8.9 - 10.3 mg/dL   GFR calc non Af Amer >60 >60 mL/min   GFR calc Af Amer >60 >60 mL/min   Anion gap 7 5 - 15  Triglycerides  Result Value Ref Range   Triglycerides 97 <150 mg/dL  CBC  Result Value Ref Range   WBC 9.4 4.0 - 10.5 K/uL   RBC 3.68 (L) 3.87 - 5.11 MIL/uL   Hemoglobin 10.6 (L) 12.0 - 15.0 g/dL   HCT 60.6 (L) 00.4 - 59.9 %   MCV 90.8 80.0 - 100.0 fL   MCH 28.8 26.0 - 34.0 pg   MCHC 31.7 30.0 - 36.0 g/dL   RDW 77.4 14.2 - 39.5 %   Platelets 252 150 - 400 K/uL   nRBC 0.0 0.0 - 0.2 %  Basic metabolic panel  Result Value Ref Range   Sodium 140 135 - 145 mmol/L   Potassium  3.1 (L) 3.5 - 5.1 mmol/L   Chloride 112 (H) 98 - 111 mmol/L   CO2 22 22 - 32 mmol/L   Glucose, Bld 115 (H) 70 - 99 mg/dL   BUN 11 8 - 23 mg/dL   Creatinine, Ser 3.20 0.44 - 1.00 mg/dL   Calcium 8.1 (L) 8.9 - 10.3 mg/dL   GFR calc non Af Amer >60 >60 mL/min   GFR calc Af Amer >60 >60 mL/min   Anion gap 6 5 - 15  CBC with Differential/Platelet  Result Value Ref Range   WBC 11.5 (H) 4.0 - 10.5 K/uL   RBC 4.00 3.87 - 5.11 MIL/uL   Hemoglobin 11.6 (L) 12.0 - 15.0 g/dL   HCT 23.3 43.5 - 68.6 %   MCV 90.0 80.0 - 100.0 fL   MCH 29.0 26.0 - 34.0 pg   MCHC 32.2 30.0 - 36.0 g/dL   RDW 16.8 37.2 - 90.2 %   Platelets 231 150 - 400 K/uL   nRBC 0.0 0.0 - 0.2 %   Neutrophils Relative % 84 %   Neutro Abs 9.8 (H) 1.7 - 7.7 K/uL   Lymphocytes Relative 6 %   Lymphs Abs 0.6 (L) 0.7 - 4.0 K/uL   Monocytes Relative 8 %   Monocytes Absolute 0.9 0.1 - 1.0 K/uL   Eosinophils Relative 1 %   Eosinophils Absolute 0.1 0.0 - 0.5 K/uL   Basophils Relative 0 %   Basophils Absolute 0.0 0.0 - 0.1 K/uL   Immature Granulocytes 1 %   Abs Immature Granulocytes 0.06 0.00 - 0.07 K/uL  Basic metabolic panel  Result Value Ref Range   Sodium 141 135 - 145 mmol/L   Potassium 3.4 (L) 3.5 - 5.1 mmol/L   Chloride 110 98 - 111 mmol/L   CO2 23 22 - 32 mmol/L   Glucose, Bld 93 70 - 99 mg/dL   BUN 14 8 - 23 mg/dL   Creatinine, Ser 1.11 0.44 - 1.00 mg/dL   Calcium 8.5 (L) 8.9 - 10.3 mg/dL   GFR calc non Af Amer >60 >60 mL/min   GFR calc  Af Amer >60 >60 mL/min   Anion gap 8 5 - 15  Magnesium  Result Value Ref Range   Magnesium 1.9 1.7 - 2.4 mg/dL  Phosphorus  Result Value Ref Range   Phosphorus 2.2 (L) 2.5 - 4.6 mg/dL  CBC  Result Value Ref Range   WBC 6.1 4.0 - 10.5 K/uL   RBC 4.14 3.87 - 5.11 MIL/uL   Hemoglobin 11.6 (L) 12.0 - 15.0 g/dL   HCT 89.3 81.0 - 17.5 %   MCV 89.6 80.0 - 100.0 fL   MCH 28.0 26.0 - 34.0 pg   MCHC 31.3 30.0 - 36.0 g/dL   RDW 10.2 58.5 - 27.7 %   Platelets 276 150 - 400 K/uL   nRBC  0.0 0.0 - 0.2 %  Basic metabolic panel  Result Value Ref Range   Sodium 140 135 - 145 mmol/L   Potassium 3.4 (L) 3.5 - 5.1 mmol/L   Chloride 106 98 - 111 mmol/L   CO2 26 22 - 32 mmol/L   Glucose, Bld 115 (H) 70 - 99 mg/dL   BUN 13 8 - 23 mg/dL   Creatinine, Ser 8.24 0.44 - 1.00 mg/dL   Calcium 9.0 8.9 - 23.5 mg/dL   GFR calc non Af Amer >60 >60 mL/min   GFR calc Af Amer >60 >60 mL/min   Anion gap 8 5 - 15  ECHOCARDIOGRAM COMPLETE  Result Value Ref Range   Weight 3,952 oz   Height 63 in   BP 106/59 mmHg      PLAN:  Continue current Lisinopril dose as ordered.  Monitor BP daily at home.     Follow up plan: Return in 3 months for BP follow-up

## 2018-05-09 NOTE — Patient Instructions (Signed)
DASH Eating Plan  DASH stands for "Dietary Approaches to Stop Hypertension." The DASH eating plan is a healthy eating plan that has been shown to reduce high blood pressure (hypertension). It may also reduce your risk for type 2 diabetes, heart disease, and stroke. The DASH eating plan may also help with weight loss.  What are tips for following this plan?    General guidelines   Avoid eating more than 2,300 mg (milligrams) of salt (sodium) a day. If you have hypertension, you may need to reduce your sodium intake to 1,500 mg a day.   Limit alcohol intake to no more than 1 drink a day for nonpregnant women and 2 drinks a day for men. One drink equals 12 oz of beer, 5 oz of wine, or 1 oz of hard liquor.   Work with your health care provider to maintain a healthy body weight or to lose weight. Ask what an ideal weight is for you.   Get at least 30 minutes of exercise that causes your heart to beat faster (aerobic exercise) most days of the week. Activities may include walking, swimming, or biking.   Work with your health care provider or diet and nutrition specialist (dietitian) to adjust your eating plan to your individual calorie needs.  Reading food labels     Check food labels for the amount of sodium per serving. Choose foods with less than 5 percent of the Daily Value of sodium. Generally, foods with less than 300 mg of sodium per serving fit into this eating plan.   To find whole grains, look for the word "whole" as the first word in the ingredient list.  Shopping   Buy products labeled as "low-sodium" or "no salt added."   Buy fresh foods. Avoid canned foods and premade or frozen meals.  Cooking   Avoid adding salt when cooking. Use salt-free seasonings or herbs instead of table salt or sea salt. Check with your health care provider or pharmacist before using salt substitutes.   Do not fry foods. Cook foods using healthy methods such as baking, boiling, grilling, and broiling instead.   Cook with  heart-healthy oils, such as olive, canola, soybean, or sunflower oil.  Meal planning   Eat a balanced diet that includes:  ? 5 or more servings of fruits and vegetables each day. At each meal, try to fill half of your plate with fruits and vegetables.  ? Up to 6-8 servings of whole grains each day.  ? Less than 6 oz of lean meat, poultry, or fish each day. A 3-oz serving of meat is about the same size as a deck of cards. One egg equals 1 oz.  ? 2 servings of low-fat dairy each day.  ? A serving of nuts, seeds, or beans 5 times each week.  ? Heart-healthy fats. Healthy fats called Omega-3 fatty acids are found in foods such as flaxseeds and coldwater fish, like sardines, salmon, and mackerel.   Limit how much you eat of the following:  ? Canned or prepackaged foods.  ? Food that is high in trans fat, such as fried foods.  ? Food that is high in saturated fat, such as fatty meat.  ? Sweets, desserts, sugary drinks, and other foods with added sugar.  ? Full-fat dairy products.   Do not salt foods before eating.   Try to eat at least 2 vegetarian meals each week.   Eat more home-cooked food and less restaurant, buffet, and fast food.     When eating at a restaurant, ask that your food be prepared with less salt or no salt, if possible.  What foods are recommended?  The items listed may not be a complete list. Talk with your dietitian about what dietary choices are best for you.  Grains  Whole-grain or whole-wheat bread. Whole-grain or whole-wheat pasta. Brown rice. Oatmeal. Quinoa. Bulgur. Whole-grain and low-sodium cereals. Pita bread. Low-fat, low-sodium crackers. Whole-wheat flour tortillas.  Vegetables  Fresh or frozen vegetables (raw, steamed, roasted, or grilled). Low-sodium or reduced-sodium tomato and vegetable juice. Low-sodium or reduced-sodium tomato sauce and tomato paste. Low-sodium or reduced-sodium canned vegetables.  Fruits  All fresh, dried, or frozen fruit. Canned fruit in natural juice (without  added sugar).  Meat and other protein foods  Skinless chicken or turkey. Ground chicken or turkey. Pork with fat trimmed off. Fish and seafood. Egg whites. Dried beans, peas, or lentils. Unsalted nuts, nut butters, and seeds. Unsalted canned beans. Lean cuts of beef with fat trimmed off. Low-sodium, lean deli meat.  Dairy  Low-fat (1%) or fat-free (skim) milk. Fat-free, low-fat, or reduced-fat cheeses. Nonfat, low-sodium ricotta or cottage cheese. Low-fat or nonfat yogurt. Low-fat, low-sodium cheese.  Fats and oils  Soft margarine without trans fats. Vegetable oil. Low-fat, reduced-fat, or light mayonnaise and salad dressings (reduced-sodium). Canola, safflower, olive, soybean, and sunflower oils. Avocado.  Seasoning and other foods  Herbs. Spices. Seasoning mixes without salt. Unsalted popcorn and pretzels. Fat-free sweets.  What foods are not recommended?  The items listed may not be a complete list. Talk with your dietitian about what dietary choices are best for you.  Grains  Baked goods made with fat, such as croissants, muffins, or some breads. Dry pasta or rice meal packs.  Vegetables  Creamed or fried vegetables. Vegetables in a cheese sauce. Regular canned vegetables (not low-sodium or reduced-sodium). Regular canned tomato sauce and paste (not low-sodium or reduced-sodium). Regular tomato and vegetable juice (not low-sodium or reduced-sodium). Pickles. Olives.  Fruits  Canned fruit in a light or heavy syrup. Fried fruit. Fruit in cream or butter sauce.  Meat and other protein foods  Fatty cuts of meat. Ribs. Fried meat. Bacon. Sausage. Bologna and other processed lunch meats. Salami. Fatback. Hotdogs. Bratwurst. Salted nuts and seeds. Canned beans with added salt. Canned or smoked fish. Whole eggs or egg yolks. Chicken or turkey with skin.  Dairy  Whole or 2% milk, cream, and half-and-half. Whole or full-fat cream cheese. Whole-fat or sweetened yogurt. Full-fat cheese. Nondairy creamers. Whipped toppings.  Processed cheese and cheese spreads.  Fats and oils  Butter. Stick margarine. Lard. Shortening. Ghee. Bacon fat. Tropical oils, such as coconut, palm kernel, or palm oil.  Seasoning and other foods  Salted popcorn and pretzels. Onion salt, garlic salt, seasoned salt, table salt, and sea salt. Worcestershire sauce. Tartar sauce. Barbecue sauce. Teriyaki sauce. Soy sauce, including reduced-sodium. Steak sauce. Canned and packaged gravies. Fish sauce. Oyster sauce. Cocktail sauce. Horseradish that you find on the shelf. Ketchup. Mustard. Meat flavorings and tenderizers. Bouillon cubes. Hot sauce and Tabasco sauce. Premade or packaged marinades. Premade or packaged taco seasonings. Relishes. Regular salad dressings.  Where to find more information:   National Heart, Lung, and Blood Institute: www.nhlbi.nih.gov   American Heart Association: www.heart.org  Summary   The DASH eating plan is a healthy eating plan that has been shown to reduce high blood pressure (hypertension). It may also reduce your risk for type 2 diabetes, heart disease, and stroke.   With the   DASH eating plan, you should limit salt (sodium) intake to 2,300 mg a day. If you have hypertension, you may need to reduce your sodium intake to 1,500 mg a day.   When on the DASH eating plan, aim to eat more fresh fruits and vegetables, whole grains, lean proteins, low-fat dairy, and heart-healthy fats.   Work with your health care provider or diet and nutrition specialist (dietitian) to adjust your eating plan to your individual calorie needs.  This information is not intended to replace advice given to you by your health care provider. Make sure you discuss any questions you have with your health care provider.  Document Released: 01/22/2011 Document Revised: 01/27/2016 Document Reviewed: 01/27/2016  Elsevier Interactive Patient Education  2019 Elsevier Inc.

## 2018-05-10 ENCOUNTER — Telehealth: Payer: Self-pay

## 2018-05-10 ENCOUNTER — Ambulatory Visit: Payer: Self-pay | Admitting: *Deleted

## 2018-05-10 NOTE — Chronic Care Management (AMB) (Signed)
  Chronic Care Management   Note  05/10/2018 Name: PARMIS FARAG MRN: [id number] DOB: 03-20-1955  Unsuccessful outreach to patient to follow up on progress of obtaining out patient PT. Unable to leave a VM.   Follow up plan: The CM team will reach out to the patient again over the next 7 days.   Ma Rings Ayo Smoak RN, BSN Nurse Case Education officer, community Family Practice/[facility]  [phone]) [employer]

## 2018-05-12 ENCOUNTER — Telehealth: Payer: Self-pay

## 2018-05-12 ENCOUNTER — Ambulatory Visit: Payer: BLUE CROSS/BLUE SHIELD

## 2018-05-13 ENCOUNTER — Telehealth: Payer: Self-pay

## 2018-05-17 ENCOUNTER — Ambulatory Visit: Payer: Self-pay | Admitting: Physical Therapy

## 2018-05-19 ENCOUNTER — Telehealth: Payer: Self-pay

## 2018-05-19 ENCOUNTER — Ambulatory Visit: Payer: Self-pay | Admitting: Licensed Clinical Social Worker

## 2018-05-19 ENCOUNTER — Ambulatory Visit: Payer: Self-pay

## 2018-05-19 NOTE — Chronic Care Management (AMB) (Signed)
  Chronic Care Management    Clinical Social Work CCM Outreach Note  05/19/2018 Name: Kristin Solis MRN: [id number] DOB: 24-Nov-1955  Kristin Solis is a 63 y.o. year old female who is a primary care patient of [name], [name], NP . The CCM team was consulted and [facility] LCSW completed outreach to [name] today by phone to introduce and offer CCM services but was unable to reach patient successfully. HIPPA compliant voice message left encouraging a return call once available.  Follow Up Plan: SW will follow up with patient by phone over the next 2-3 weeks  Dickie La, BSW, MSW, LCSW [facility] [place]  [facility] [email or web address] Phone: [phone]

## 2018-05-23 ENCOUNTER — Encounter: Payer: Self-pay | Admitting: Adult Health

## 2018-05-23 ENCOUNTER — Telehealth: Payer: Self-pay | Admitting: Adult Health

## 2018-05-23 NOTE — Telephone Encounter (Signed)
Pt called in and stated she has a knot on her right leg and its bruising and she wants to know whats the best thing to do

## 2018-05-24 ENCOUNTER — Telehealth: Payer: Self-pay

## 2018-05-24 ENCOUNTER — Ambulatory Visit: Payer: Self-pay | Admitting: Physical Therapy

## 2018-05-24 ENCOUNTER — Ambulatory Visit: Payer: Self-pay | Admitting: *Deleted

## 2018-05-24 NOTE — Chronic Care Management (AMB) (Signed)
  Chronic Care Management   Note  05/24/2018 Name: Kristin Solis MRN: [id number] DOB: 1955/10/28  Placed a call to Ms. [name] patient of [name], NP today to discuss goals for managing their chronic condition of CVA/HTN. Unable to reach patient at this time, was not able to leave a HIPPA compliant voice mail to  request a return call. This patient initially consented to work with CCM team, but has been unreachable for follow up for myself nor CCM-SW since initial attempt.   Follow up plan: The patient has been provided with contact information for the chronic care management team and has been advised to call with any health related questions or concerns.     Ma Rings Jerolyn Flenniken RN, BSN Nurse Case Education officer, community Family Practice/[facility]  [phone]) Business [employer]

## 2018-05-26 ENCOUNTER — Ambulatory Visit: Payer: Self-pay | Admitting: Physical Therapy

## 2018-05-31 ENCOUNTER — Telehealth: Payer: Self-pay

## 2018-05-31 ENCOUNTER — Ambulatory Visit: Payer: Self-pay

## 2018-05-31 NOTE — Telephone Encounter (Signed)
Spoke with pt- stated she had no question, verified the email and sent her all the information.

## 2018-05-31 NOTE — Telephone Encounter (Signed)
I called pt and she was confuse if she had done the process of downloading webex on her phone. Pt stated she saw the web ex on her phone as a app or icon. She receive the email for the visit. I explain to pt to log to meeting 5 minutes prior and wait for JEssica NP to come on. Pt verbalized understanding

## 2018-05-31 NOTE — Telephone Encounter (Signed)
Pt called back and gave consent to a Virtual Visit and for the insurance to be billed as such. E-mail was confirmed. Pt is needing help with the set up process.

## 2018-05-31 NOTE — Telephone Encounter (Signed)
Left vm for patient that we are changing office visit to virtual video visit due to  COVID 19. I stated we are not doing in office visit. I request pt call back to verify email that we can send her the invite for the visit.ALso need to know if she has a iphone, motorola, android samsung or any phone that has a camera on it.ALso we need verbal consent to do video and file insurance.

## 2018-06-02 ENCOUNTER — Ambulatory Visit: Payer: Self-pay

## 2018-06-06 ENCOUNTER — Other Ambulatory Visit: Payer: Self-pay

## 2018-06-06 ENCOUNTER — Encounter: Payer: Self-pay | Admitting: Adult Health

## 2018-06-06 ENCOUNTER — Ambulatory Visit (INDEPENDENT_AMBULATORY_CARE_PROVIDER_SITE_OTHER): Payer: BLUE CROSS/BLUE SHIELD | Admitting: Adult Health

## 2018-06-06 ENCOUNTER — Ambulatory Visit: Payer: Self-pay | Admitting: Licensed Clinical Social Worker

## 2018-06-06 DIAGNOSIS — G4733 Obstructive sleep apnea (adult) (pediatric): Secondary | ICD-10-CM | POA: Diagnosis not present

## 2018-06-06 DIAGNOSIS — I10 Essential (primary) hypertension: Secondary | ICD-10-CM

## 2018-06-06 DIAGNOSIS — I63232 Cerebral infarction due to unspecified occlusion or stenosis of left carotid arteries: Secondary | ICD-10-CM

## 2018-06-06 DIAGNOSIS — I63512 Cerebral infarction due to unspecified occlusion or stenosis of left middle cerebral artery: Secondary | ICD-10-CM

## 2018-06-06 DIAGNOSIS — E785 Hyperlipidemia, unspecified: Secondary | ICD-10-CM

## 2018-06-06 DIAGNOSIS — Z9989 Dependence on other enabling machines and devices: Secondary | ICD-10-CM

## 2018-06-06 DIAGNOSIS — I69351 Hemiplegia and hemiparesis following cerebral infarction affecting right dominant side: Secondary | ICD-10-CM

## 2018-06-06 NOTE — Patient Instructions (Signed)
Licensed Clinical Social Worker Visit Information  Goals we discussed today:  Goals Addressed    . "I want to gain Medicaid because my insurance runs out at the end of this month" (pt-stated)       Current Barriers:  . Financial constraints . ADL IADL limitations . Mental Health Concerns  . Limited education about Medicaid enrollment process* . Memory Deficits . Inability to perform ADL's independently . Inability to perform IADL's independently  Clinical Social Work Clinical Goal(s):  [name] Over the next 90 days, client will work with SW to address concerns related to gaining Medicaid . Over the next 90 days, client will follow up with DSS Medicaid caseworker* as directed by SW  Interventions: . Patient interviewed and appropriate assessments performed . Provided mental health counseling with regard to patient was emotional throughout phone call due to her daily difficulty with mobility/weakness/balance. LCSW provided reflective listening and implemented appropriate interventions to help suppport patient and her emotional needs (mental health diagnosis or concern) . Discussed plans with patient for ongoing care management follow up and provided patient with direct contact information for care management team . Advised patient to contact DSS to follow up on Medicaid application that was submitted as well as her Food Stamps application . Collaborated with RN Case Manager re: nursing needs . Assisted patient/caregiver with obtaining information about health plan benefits  Patient Self Care Activities:  . Attends all scheduled provider appointments . Calls provider office for new concerns or questions  Initial goal documentation    Materials Provided: Verbal education about Medicaid contact numbers provided by phone  Follow Up Plan: SW will follow up with patient by phone over the next 3-4 weeks  Dickie La, BSW, MSW, LCSW [facility] [place]   [facility] [email or web address] Phone: [phone]

## 2018-06-06 NOTE — Chronic Care Management (AMB) (Signed)
  Chronic Care Management    Clinical Social Work General Note  06/06/2018 Name: Kristin Solis MRN: [id number] DOB: 11-28-1955  KALYNE DUNGEE is a 63 y.o. year old female who is a primary care patient of [name], [name], NP. The CCM was consulted to assist the patient with [employer]. LCSW completed outreach call to patient and patient answered and provided HIPPA verifications. Patient has already successfully enrolled in the CCM program.   Review of patient status, including review of consultants reports, relevant laboratory and other test results, and collaboration with appropriate care team members and the patient's provider was performed as part of comprehensive patient evaluation and provision of chronic care management services.    Goals Addressed    . "I want to gain Medicaid because my insurance runs out at the end of this month" (pt-stated)       Current Barriers:  . Financial constraints . ADL IADL limitations . Mental Health Concerns  . Limited education about Medicaid enrollment process* . Memory Deficits . Inability to perform ADL's independently . Inability to perform IADL's independently  Clinical Social Work Clinical Goal(s):  [name] Over the next 90 days, client will work with SW to address concerns related to gaining Medicaid . Over the next 90 days, client will follow up with DSS Medicaid caseworker* as directed by SW  Interventions: . Patient interviewed and appropriate assessments performed . Provided mental health counseling with regard to patient was emotional throughout phone call due to her daily difficulty with mobility/weakness/balance. LCSW provided reflective listening and implemented appropriate interventions to help suppport patient and her emotional needs (mental health diagnosis or concern) . Discussed plans with patient for ongoing care management follow up and provided patient with direct contact information for care management team . Advised  patient to contact DSS to follow up on Medicaid application that was submitted as well as her Food Stamps application . Collaborated with RN Case Manager re: nursing needs . Assisted patient/caregiver with obtaining information about health plan benefits  Patient Self Care Activities:  . Attends all scheduled provider appointments . Calls provider office for new concerns or questions  Initial goal documentation   Follow Up Plan: SW will follow up with patient by phone over the next 3-4 weeks      Dickie La, BSW, MSW, LCSW [facility] [place]  [facility] [email or web address] Phone: [phone]

## 2018-06-06 NOTE — Progress Notes (Signed)
[facility] [address] [place]. [place] [address] [phone]       VIRTUAL VISIT FOLLOW UP NOTE  [name] Date of Birth:  1955/11/03 Medical Record Number:  [id number]   Reason for Referral:  hospital stroke follow up    Virtual Visit via Video Note  I connected with Kristin Solis on 06/06/18 at  3:15 PM EDT by a video enabled telemedicine application located remotely in my own home and verified that I am speaking with the correct person using two identifiers who was located at their own home without anyone else in the room.   I discussed the limitations of evaluation and management by telemedicine and the availability of in person appointments. The patient expressed understanding and agreed to proceed.   CHIEF COMPLAINT:  Chief Complaint  Patient presents with   Follow-up    hospital stroke follow up     HPI: Kristin Solis was initially scheduled today for in office hospital follow-up regarding left MCA infarct s/p TPA and IR with TICI 3 reperfusion and left ICA rescue stent with infarct likely due to large vessel disease extracranial carotid stenosis on 04/17/2018 but due to COVID-19 safety precautions, visit transition to telemedicine via WebEx. History obtained from patient and chart review. Reviewed all radiology images and labs personally.  Kristin Solis a 63 y.o.femalewith history of asthma and HTN who presented to [facility] with dysphasia and R HP which occurred while talking about a traumatic event. Symptoms completely resolved prior to ED. While in the ED, she began having difficulty speaking again along with dense right hemiparesis. She receivedtPA 04/17/2018 at 2032.CTA showed a dense LM1 occlusion with large ischemic penumbra on perfusion and CTA neck showed a left ICA embolus. She was transferred to [facility] and sent to IR for mechanical thrombectomy where she had TICI 3 reperfusion using ambo trap and stent assisted  angioplasty of severely stenotic L ICA at the bulb.  Post CT head negative for ICH, mass-effect or midline shift.  MRI brain reviewed and showed multifocal acute left MCA territory infarcts and anterior/posterior border zone infarcts with scattered petechial hemorrhage secondary to large vessel disease.  2D echo unremarkable without cardiac source of embolism identified.  Initiated aspirin 81 mg and Brilinta post stent placement and secondary stroke prevention.  Initially found to be in hypertensive emergency and treated with Cleviprex which stabilized throughout admission and recommended long-term BP goal normotensive range.  LDL 102 and initiated atorvastatin 40 mg daily.  Diagnosed with OSA during inpatient and CPAP device supplied at discharge.  Other stroke risk factors include obesity, prior stroke per imaging and family history of stroke.  She had residual deficits of right hemiparesis but no residual speech difficulties and discharged home in stable condition with recommendations of home health PT/OT.  She continues to have residual deficits of right hemiparesis but does endorse improvement and endorses resolution of speech difficulty.  She did not receive any home health therapies as per patient, her insurance did not cover this.  She has been doing exercises on her own at home.  She has been able to continue ADLs independently but has required assistance with IADLs.  She endorses difficulty writing as she is right-hand dominant.  She is able to ambulate without assistive device within her home but will use a cane or rolling walker for long distance.  During conversation of ongoing deficits and difficulties, patient becomes tearful.  Discussion regarding potential post stroke depression with PHQ  2 score 0.  She denies underlying history of depression or anxiety.  She continues to be motivated for ongoing improvement but becomes upset when she attempts to try to do something with her right side and she is  unable to or she speaks about it.  She continues on aspirin and Brilinta without side effects of bleeding or bruising.  Continues on atorvastatin without side effects of myalgias.  Blood pressure monitored at home and typically ranges 120s/70s.  She continues to be compliant with CPAP for OSA management.  She has not scheduled appointment at this time with vascular surgery as recommended at 4 weeks post discharge.  No further concerns at this time.  Denies new or worsening stroke/TIA symptoms.    ROS:   14 system review of systems performed and negative with exception of weakness and apnea  PMH:  Past Medical History:  Diagnosis Date   Asthma    Hypertension     PSH:  Past Surgical History:  Procedure Laterality Date   ABDOMINAL HYSTERECTOMY  2005   total   IR CT HEAD LTD  04/18/2018   IR INTRAVSC STENT CERV CAROTID W/O EMB-PROT MOD SED INC ANGIO  04/18/2018   IR PERCUTANEOUS ART THROMBECTOMY/INFUSION INTRACRANIAL INC DIAG ANGIO  04/18/2018   OOPHORECTOMY     RADIOLOGY WITH ANESTHESIA N/A 04/17/2018   Procedure: RADIOLOGY WITH ANESTHESIA;  Surgeon: Julieanne Cotton, MD;  Location: [facility] OR;  Service: Radiology;  Laterality: N/A;    Social History:  Social History   Socioeconomic History   Marital status: Married    Spouse name: Not on file   Number of children: Not on file   Years of education: Not on file   Highest education level: Not on file  Occupational History   Not on file  Social Needs   Financial resource strain: Not on file   Food insecurity:    Worry: Not on file    Inability: Not on file   Transportation needs:    Medical: Not on file    Non-medical: Not on file  Tobacco Use   Smoking status: Never Smoker   Smokeless tobacco: Never Used  Substance and Sexual Activity   Alcohol use: No   Drug use: No   Sexual activity: Never  Lifestyle   Physical activity:    Days per week: Not on file    Minutes per session: Not on file   Stress: Not  on file  Relationships   Social connections:    Talks on phone: Not on file    Gets together: Not on file    Attends religious service: Not on file    Active member of club or organization: Not on file    Attends meetings of clubs or organizations: Not on file    Relationship status: Not on file   Intimate partner violence:    Fear of current or ex partner: Not on file    Emotionally abused: Not on file    Physically abused: Not on file    Forced sexual activity: Not on file  Other Topics Concern   Not on file  Social History Narrative   Not on file    Family History:  Family History  Problem Relation Age of Onset   Cancer Mother        lung   Cancer Father        brain   Diabetes Sister    Alcohol abuse Brother    Stroke Son  x2   CAD Maternal Grandmother    Emphysema Paternal Grandfather    Heart disease Sister        CHF    Medications:   Current Outpatient Medications on File Prior to Visit  Medication Sig Dispense Refill   albuterol (PROVENTIL HFA;VENTOLIN HFA) 108 (90 Base) MCG/ACT inhaler Inhale 2 puffs into the lungs every 6 (six) hours as needed for wheezing or shortness of breath. 3 Inhaler 3   aspirin EC 81 MG EC tablet Take 1 tablet (81 mg total) by mouth daily.     atorvastatin (LIPITOR) 40 MG tablet Take 1 tablet (40 mg total) by mouth daily at 6 PM. 30 tablet 11   fluticasone (FLONASE) 50 MCG/ACT nasal spray Place 2 sprays into both nostrils daily. 48 g 3   Fluticasone-Salmeterol (ADVAIR DISKUS) 250-50 MCG/DOSE AEPB Inhale 1 puff into the lungs 2 (two) times daily. 240 each 3   lisinopril (PRINIVIL,ZESTRIL) 5 MG tablet Take 1 tablet (5 mg total) by mouth daily. 90 tablet 3   ticagrelor (BRILINTA) 90 MG TABS tablet Take 1 tablet (90 mg total) by mouth 2 (two) times daily. 60 tablet 2   No current facility-administered medications on file prior to visit.     Allergies:  No Known Allergies   Physical Exam  There were no  vitals filed for this visit. There is no height or weight on file to calculate BMI. No exam data present  Depression screen [facility] 06/06/2018  Decreased Interest 0  Down, Depressed, Hopeless 0  PHQ - 2 Score 0  Altered sleeping -  Tired, decreased energy -  Change in appetite -  Feeling bad or failure about yourself  -  Trouble concentrating -  Moving slowly or fidgety/restless -  Suicidal thoughts -  PHQ-9 Score -     General: Pleasant obese Caucasian female, seated, in no evident distress Head: head normocephalic and atraumatic.     Neurologic Exam Mental Status: Awake and fully alert.  Unable to appreciate speech deficits.  Oriented to place and time. Recent and remote memory intact. Attention span, concentration and fund of knowledge appropriate. Mood and affect appropriate.  Cranial Nerves: Extraocular movements full without nystagmus.  Hearing intact to voice.  Very mild right nasolabial fold weakness.  Tongue protrusion deviates towards right.  Shoulder shrug symmetric. Motor: No evidence of large muscle upper extremity weakness project assessment.  No evidence of drift in bilateral lower extremities.  Decreased right hand finger tapping.  Orbits left arm over right arm.  Unable to stand from seated position with arms crossed.  Unable to balance/stand on right leg. Sensory.: UTA but denies temperature variation or numbness/tingling Coordination: Finger-to-nose and heel-to-shin performed accurately bilaterally.  Decreased right hand finger dexterity.  Orbits left arm over right hand. Gait and Station: Arises from chair with mild difficulty and uses desk for assistance. Stance is normal. Gait demonstrates mild hemiplegic gait but is able to ambulate without assistive device.  Difficulty raising on toes and going back on heels. Reflexes: UTA   NIHSS  1 Modified Rankin  2    Diagnostic Data (Labs, Imaging, Testing)  CT HEAD WO CONTRAST 04/17/2018 IMPRESSION: 1. Mild  asymmetric hypoattenuation within the left lateral frontal lobe and anterior insula in comparison with the right which may represent acute stroke. No hemorrhage or mass effect. 2. Small density within left Sylvian fissure, possible MCA branch thrombus. 3. ASPECTS is 8. 4. Several small chronic infarctions in left parietal cortex.  CT ANGIO  HEAD W OR WO CONTRAST CT ANGIO NECK W OR WO CONTRAST CT CEREBRAL PERFUSION W CONTRAST 04/17/2018 IMPRESSION: CT brain perfusion: Left MCA distribution large ischemic penumbra, 77 cc. Left frontal white matter and insula core infarct of 11 cc. ASPECTS 8 infarct on noncontrast CT of head with possible pseudonormalization of perfusion. CTA head: 1. Left M1 occlusion.  Poor left MCA distribution collateralization. 2. No additional intracranial large vessel occlusion, high-grade stenosis, aneurysm, or vascular malformation. CTA neck: 1. Left proximal ICA thromboembolism. The ICA downstream to the thrombosis is opacified with contrast suggesting that the thrombus is near occlusive, but not totally occlusive. 2. Patent right carotid system and bilateral vertebral arteries without hemodynamically significant stenosis by NASCET criteria, dissection, or aneurysm.  MR BRAIN WO CONTRAST 04/18/2018 IMPRESSION: 1. Multifocal acute LEFT MCA territory infarcts and anterior/posterior border zone infarcts with scattered petechial Hemorrhage.  IR 04/17/2018 IMPRESSION: Status post endovascular complete revascularization of occluded left internal carotid artery terminus, left middle cerebral artery M1 segment, and left anterior cerebral artery, with 1 pass with a 5 mm x 33 mm Embotrap retrieval device, and aspiration assistance with a Penumbra aspiration simultaneously as described achieving a TICI 3 revascularization. Status post endovascular revascularization of now complete occlusion of the symptomatic left internal carotid artery proximally with stent  assisted angioplasty.  ECHOCARDIOGRAM 04/18/2018  1. The left ventricle has hyperdynamic systolic function, with an ejection fraction of >65%. The cavity size was normal. There is moderately increased left ventricular wall thickness. Left ventricular diastolic Doppler parameters are consistent with  impaired relaxation.  2. The right ventricle has normal systolic function. The cavity was normal. There is no increase in right ventricular wall thickness.  3. The mitral valve is normal in structure.  4. The tricuspid valve is normal in structure.  5. The aortic valve is tricuspid.  6. The pulmonic valve was normal in structure.    ASSESSMENT: Kristin Solis is a 63 y.o. year old female here with left MCA infarct status post TPA with TICI 3 reperfusion left ICA rescue stent on 04/17/2018 secondary to large vessel disease extracranial carotid stenosis. Vascular risk factors include HTN, HLD, new diagnosis of OSA, carotid stenosis and obesity.  She has been stable from a stroke standpoint with residual deficits of right hemiparesis.    PLAN:  1. Left MCA infarct: Continue aspirin 81 mg daily and Brilinta  and atorvastatin for secondary stroke prevention. Maintain strict control of hypertension with blood pressure goal below 130/90, diabetes with hemoglobin A1c goal below 6.5% and cholesterol with LDL cholesterol (bad cholesterol) goal below 70 mg/dL.  I also advised the patient to eat a healthy diet with plenty of whole grains, cereals, fruits and vegetables, exercise regularly with at least 30 minutes of continuous activity daily and maintain ideal body weight. 2. Right hemiparesis: Continue ongoing exercises at home and consider participating in outpatient PT/OT once COVID-19 safety precautions lifted 3. ?  Reactive depression: Denies depression or anxiety symptoms.  PHQ 2 score 0.  Discussion regarding post stroke depression and to be aware of signs and symptoms.  4. HTN: Advised to continue current  treatment regimen.  Advised to continue to monitor at home along with continued follow-up with PCP for management 5. HLD: Advised to continue current treatment regimen along with continued follow-up with PCP for future prescribing and monitoring of lipid panel 6. Left ICA stent: Continue aspirin and Brilinta.  Advised patient that vascular surgery office will be contacted in regards to need of scheduling  follow-up appointment.  She is requesting financial assistance paperwork to be filled out so she can obtain Brilinta at no cost as she currently pays $400 per month.  Advised her to fax form to our office so this can be filled out. 7. OSA: Continue ongoing compliance with CPAP    Follow up in 3 months or call earlier if needed   Greater than 50% of time during this 35 minute visit was spent on counseling, explanation of diagnosis of left MCA infarct, reviewing risk factor management of HTN, HLD, OSA and left ICA stenosis s/p stent, discussion regarding post stroke depression, discussion regarding typical timeline of stroke recovery, planning of further management along with potential future management, and discussion with patient and family answering all questions.    George Hugh, AGNP-BC  [facility] [address] [place], [place] [address]  Phone [phone] Fax [phone] Note: This document was prepared with digital dictation and possible smart phrase technology. Any transcriptional errors that result from this process are unintentional.

## 2018-06-07 ENCOUNTER — Telehealth: Payer: Self-pay | Admitting: Adult Health

## 2018-06-07 ENCOUNTER — Telehealth (HOSPITAL_COMMUNITY): Payer: Self-pay

## 2018-06-07 ENCOUNTER — Ambulatory Visit: Payer: Self-pay

## 2018-06-07 ENCOUNTER — Encounter: Payer: Self-pay | Admitting: Adult Health

## 2018-06-07 ENCOUNTER — Telehealth: Payer: Self-pay

## 2018-06-07 NOTE — Telephone Encounter (Signed)
The CCM team is aware and is working on this, but I will pass this note onto them as well.  I believe she may be close to getting Medicare, but am not sure.

## 2018-06-07 NOTE — Progress Notes (Signed)
I agree with the above plan 

## 2018-06-07 NOTE — Telephone Encounter (Signed)
Called to schedul 4 wk stroke f/u, no answer, left vm. AW

## 2018-06-07 NOTE — Telephone Encounter (Signed)
Pt called wanting to know if RN received the fax that was sent to. Please advise.

## 2018-06-07 NOTE — Telephone Encounter (Signed)
Kristin Solis from [place] called to the office and spoke to her. Kristin Solis stated that pt was in the hospital about a couple of months ago for stroke. Kristin Solis states that patient might need home health, OT, PT services but [employer] will not pay for these services. She stated that patient is working on Education officer, environmental for more services. Kristin Solis states that patient might need to be seen earlier than 08/09/2018 to discuss with Jolene about referals for home health service, OT, PT etc. I advice Kristin Solis that I will let Jolene know.

## 2018-06-08 ENCOUNTER — Ambulatory Visit: Payer: Self-pay | Admitting: Licensed Clinical Social Worker

## 2018-06-08 ENCOUNTER — Encounter: Payer: Self-pay | Admitting: Adult Health

## 2018-06-08 NOTE — Telephone Encounter (Signed)
Form for brilinta for pt assitance given to Kristin Solis by Shanda Bumps NP. Form mailed by Kristin Solis to pt.

## 2018-06-08 NOTE — Chronic Care Management (AMB) (Signed)
  Care Management Note   Kristin Solis is a 63 y.o. year old female who is a primary care patient of [name], [name], NP. The CM team was consulted for assistance with chronic disease management and care coordination.   I reached out to [name] by phone today but was unable to reach her successfully. LCSW left a HIPPA compliant voice message encouraging patient to return call once available.   Review of patient status, including review of consultants reports, relevant laboratory and other test results, and collaboration with appropriate care team members and the patient's provider was performed as part of comprehensive patient evaluation and provision of chronic care management services.   Follow Up Plan: Face to Face appointment with CCM team member scheduled foR: 06/20/2018  Dickie La, BSW, MSW, LCSW [facility] [place]  [facility] [email or web address] Phone: [phone]

## 2018-06-08 NOTE — Telephone Encounter (Addendum)
Shanda Bumps NP will complete form for brilinta assistance.

## 2018-06-09 ENCOUNTER — Ambulatory Visit: Payer: Self-pay

## 2018-06-14 ENCOUNTER — Ambulatory Visit: Payer: Self-pay

## 2018-06-16 ENCOUNTER — Ambulatory Visit: Payer: Self-pay

## 2018-06-20 ENCOUNTER — Other Ambulatory Visit: Payer: Self-pay

## 2018-06-20 ENCOUNTER — Ambulatory Visit: Payer: BLUE CROSS/BLUE SHIELD | Admitting: Licensed Clinical Social Worker

## 2018-06-20 DIAGNOSIS — I6602 Occlusion and stenosis of left middle cerebral artery: Secondary | ICD-10-CM

## 2018-06-20 DIAGNOSIS — I10 Essential (primary) hypertension: Secondary | ICD-10-CM

## 2018-06-20 NOTE — Chronic Care Management (AMB) (Signed)
  Chronic Care Management    Clinical Social Work General Note  06/20/2018 Name: CARAN MILLSON MRN: [id number] DOB: May 25, 1955  MYANA SZUBA is a 63 y.o. year old female who is a primary care patient of [name], [name], NP. The CCM was consulted to assist the patient with [employer].   Review of patient status, including review of consultants reports, relevant laboratory and other test results, and collaboration with appropriate care team members and the patient's provider was performed as part of comprehensive patient evaluation and provision of chronic care management services.     Goals Addressed    . "I want to gain Medicaid because my insurance runs out at the end of this month" (pt-stated)       Current Barriers:  . Financial constraints . ADL IADL limitations . Mental Health Concerns  . Limited education about Medicaid enrollment process* . Memory Deficits . Inability to perform ADL's independently . Inability to perform IADL's independently  Clinical Social Work Clinical Goal(s):  [name] Over the next 90 days, client will work with SW to address concerns related to gaining Medicaid . Over the next 90 days, client will follow up with DSS Medicaid caseworker* as directed by SW  Interventions: . Patient interviewed and appropriate assessments performed . Patient reports that she has successfully submitted additional documents to DSS Medicaid caseworker in order to complete application. She reports that she was informed by that caseworker that she will be assigned to a different Medicaid caseworker and should be receiving a call from them in the new few weeks to ensure that they have everything they need in order to complete her Medicaid application. Patient agreeable to keep phone near her and to check her voice mailbox for missed messages/calls.  . Provided mental health counseling with regard to patient was emotional throughout phone call due to her daily difficulty with  mobility/weakness/balance. LCSW provided reflective listening and implemented appropriate interventions to help suppport patient and her emotional needs  . Discussed plans with patient for ongoing care management follow up and provided patient with direct contact information for care management team . Advised patient to contact DSS to follow up on Medicaid application that was submitted as well as her Food Stamps application . Assisted patient/caregiver with obtaining information about health plan benefits  Patient Self Care Activities:  . Attends all scheduled provider appointments . Calls provider office for new concerns or questions  Please see past updates related to this goal by clicking on the "Past Updates" button in the selected goal    Follow Up Plan: SW will follow up with patient by phone over the next 14 days      Dickie La, BSW, MSW, LCSW [facility] [place]  [facility] [email or web address] Phone: [phone]

## 2018-06-27 ENCOUNTER — Other Ambulatory Visit: Payer: Self-pay

## 2018-06-27 ENCOUNTER — Ambulatory Visit (INDEPENDENT_AMBULATORY_CARE_PROVIDER_SITE_OTHER): Payer: BLUE CROSS/BLUE SHIELD | Admitting: Licensed Clinical Social Worker

## 2018-06-27 DIAGNOSIS — I6602 Occlusion and stenosis of left middle cerebral artery: Secondary | ICD-10-CM | POA: Diagnosis not present

## 2018-06-27 NOTE — Chronic Care Management (AMB) (Signed)
  Chronic Care Management    Clinical Social Work General Note  06/27/2018 Name: TYRIKA KOLTZ MRN: [id number] DOB: Aug 26, 1955  MONE VALERO is a 63 y.o. year old female who is a primary care patient of [name], [name], NP. The CCM was consulted to assist the patient with [employer].   Review of patient status, including review of consultants reports, relevant laboratory and other test results, and collaboration with appropriate care team members and the patient's provider was performed as part of comprehensive patient evaluation and provision of chronic care management services.    SDOH (Social Determinants of Health) screening performed today.   Goals Addressed    . "I want to gain Medicaid because my insurance runs out at the end of this month" (pt-stated)       Current Barriers:  . Financial constraints . ADL IADL limitations . Mental Health Concerns  . Limited education about Medicaid enrollment process* . Memory Deficits . Inability to perform ADL's independently . Inability to perform IADL's independently  Clinical Social Work Clinical Goal(s):  [name] Over the next 90 days, client will work with SW to address concerns related to gaining Medicaid . Over the next 90 days, client will follow up with DSS Medicaid caseworker* as directed by SW  Interventions: . Patient interviewed and appropriate assessments performed . Patient reports that her Medicaid application must be fully completed by 07/03/2018. She reports that she received a call from her Medicaid caseworker last week informing her that she is needing to mail in additional documents and medical bills in order to complete application. LCSW completed call to patient's caseworker at [phone] and asked that Medicaid completion date be extended due to COVID-19 and barriers with gaining required documents. . Provided mental health counseling with regard to patient was emotional throughout phone call due to her daily  difficulty with mobility/weakness/balance. LCSW provided reflective listening and implemented appropriate interventions to help suppport patient and her emotional needs  . Discussed plans with patient for ongoing care management follow up and provided patient with direct contact information for care management team . Advised patient to keep in contact with DSS to follow up on Medicaid and Food Stamps application . Assisted patient/caregiver with obtaining information about health plan benefits  Patient Self Care Activities:  . Attends all scheduled provider appointments . Calls provider office for new concerns or questions  Please see past updates related to this goal by clicking on the "Past Updates" button in the selected goal    Follow Up Plan: SW will follow up with patient by phone over the next 2 weeks      Dickie La, BSW, MSW, LCSW [facility] [place]  [facility] [email or web address] Phone: [phone]

## 2018-06-28 ENCOUNTER — Ambulatory Visit: Payer: Self-pay | Admitting: Licensed Clinical Social Worker

## 2018-06-28 DIAGNOSIS — I6602 Occlusion and stenosis of left middle cerebral artery: Secondary | ICD-10-CM

## 2018-06-28 NOTE — Chronic Care Management (AMB) (Signed)
  Chronic Care Management    Clinical Social Work Follow Up Note  06/28/2018 Name: Kristin Solis MRN: [id number] DOB: 1956/01/21  Kristin Solis is a 63 y.o. year old female who is a primary care patient of [name], [name], NP. The CCM team was consulted for assistance with [employer].   Review of patient status, including review of consultants reports, other relevant assessments, and collaboration with appropriate care team members and the patient's provider was performed as part of comprehensive patient evaluation and provision of chronic care management services.     Goals Addressed    . "I want to gain Medicaid because my insurance runs out at the end of this month" (pt-stated)       Current Barriers:  . Financial constraints . ADL IADL limitations . Mental Health Concerns  . Limited education about Medicaid enrollment process* . Memory Deficits . Inability to perform ADL's independently . Inability to perform IADL's independently  Clinical Social Work Clinical Goal(s):  [name] Over the next 90 days, client will work with SW to address concerns related to gaining Medicaid . Over the next 90 days, client will follow up with DSS Medicaid caseworker* as directed by SW  Interventions: . Patient interviewed and appropriate assessments performed . Patient reports that she wanted to give LCSW new update. Patient's Medicaid application must be fully completed by 07/03/2018. She reports that she received a call from her Medicaid caseworker yesterday after LCSW completed call to caseworker. She reports that they discussed her Medicaid application. Patient successfully mailed requested documents to [facility] caseworker yesterday after she answered her questions (in regards to completing application) . Provided mental health counseling with regard to patient was emotional throughout phone call due to her daily difficulty with mobility/weakness/balance. LCSW provided reflective listening and  implemented appropriate interventions to help suppport patient and her emotional needs  . Discussed plans with patient for ongoing care management follow up and provided patient with direct contact information for care management team . Advised patient to keep in contact with DSS to follow up on Medicaid and Food Stamps application . Assisted patient/caregiver with obtaining information about health plan benefits  Patient Self Care Activities:  . Attends all scheduled provider appointments . Calls provider office for new concerns or questions  Please see past updates related to this goal by clicking on the "Past Updates" button in the selected goal      Follow Up Plan: SW will follow up with patient by phone over the next 30 days  Dickie La, BSW, MSW, LCSW [facility] Care [facility] [place]  [facility] [email or web address] Phone: [phone]

## 2018-06-30 ENCOUNTER — Telehealth: Payer: Self-pay

## 2018-06-30 NOTE — Telephone Encounter (Signed)
Form for patient assistance form fax back to [phone].Questions were ask about allergies , current health condition, and medication list attach. Form fax and confirmed.

## 2018-07-04 ENCOUNTER — Telehealth: Payer: Self-pay

## 2018-07-15 ENCOUNTER — Other Ambulatory Visit: Payer: Self-pay

## 2018-07-15 ENCOUNTER — Ambulatory Visit: Payer: Self-pay | Admitting: Licensed Clinical Social Worker

## 2018-07-15 DIAGNOSIS — I10 Essential (primary) hypertension: Secondary | ICD-10-CM

## 2018-07-15 NOTE — Chronic Care Management (AMB) (Signed)
  Care Management   Follow Up Note   07/15/2018 Name: Kristin Solis MRN: [id number] DOB: Sep 27, 1955  Referred by: Kristin Skiff, NP Reason for referral : Care Coordination   Kristin Solis is a 63 y.o. year old female who is a primary care patient of [name], Kristin Rank, NP. The care management team was consulted for assistance with care management and care coordination needs.    Review of patient status, including review of consultants reports, relevant laboratory and other test results, and collaboration with appropriate care team members and the patient's provider was performed as part of comprehensive patient evaluation and provision of chronic care management services.    Goals Addressed    . "I want to gain Medicaid because my insurance runs out at the end of this month" (pt-stated)       Current Barriers:  . Financial constraints . ADL IADL limitations . Mental Health Concerns  . Limited education about Medicaid enrollment process* . Memory Deficits . Inability to perform ADL's independently . Inability to perform IADL's independently  Clinical Social Work Clinical Goal(s):  [name] Over the next 90 days, client will work with SW to address concerns related to gaining Medicaid . Over the next 90 days, client will follow up with DSS Medicaid caseworker* as directed by SW  Interventions: . Patient interviewed and appropriate assessments performed . Patient reports that her Medicaid application is almost completed. She reports that she received a call from her Medicaid caseworker this week and discussed needed her to complete corrections on her Medicaid application. Patient successfully mailed requested and corrected documents to [facility] caseworker.  . Provided mental health counseling with regard to patient was emotional throughout phone call due to her daily difficulty with mobility/weakness/balance. LCSW provided reflective listening and implemented appropriate interventions to  help suppport patient and her emotional needs  . Discussed plans with patient for ongoing care management follow up and provided patient with direct contact information for care management team . Advised patient to keep in contact with DSS to follow up on Medicaid. Patient confirms receiving Food Stamps ($200 per month) . Assisted patient/caregiver with obtaining information about health plan benefits  Patient Self Care Activities:  . Attends all scheduled provider appointments . Calls provider office for new concerns or questions  Please see past updates related to this goal by clicking on the "Past Updates" button in the selected goal    The care management team will reach out to the patient again over the next 30 days.   Dickie La, BSW, MSW, LCSW [facility] Care [facility] [place]  [facility] [email or web address] Phone: [phone]

## 2018-07-22 ENCOUNTER — Other Ambulatory Visit: Payer: Self-pay

## 2018-07-22 NOTE — Patient Outreach (Signed)
Telephone outreach to patient to obtain mRs was successfully completed. mRs= 3. 

## 2018-07-29 ENCOUNTER — Ambulatory Visit: Payer: Self-pay | Admitting: Licensed Clinical Social Worker

## 2018-07-29 ENCOUNTER — Other Ambulatory Visit: Payer: Self-pay

## 2018-07-29 DIAGNOSIS — I10 Essential (primary) hypertension: Secondary | ICD-10-CM

## 2018-07-29 DIAGNOSIS — J4541 Moderate persistent asthma with (acute) exacerbation: Secondary | ICD-10-CM

## 2018-07-29 NOTE — Chronic Care Management (AMB) (Signed)
  Chronic Care Management    Clinical Social Work Follow Up Note  07/29/2018 Name: Kristin Solis MRN: [id number] DOB: 1956-01-18  Kristin Solis is a 63 y.o. year old female who is a primary care patient of [name], [name], NP. The CCM team was consulted for assistance with [employer].   Review of patient status, including review of consultants reports, other relevant assessments, and collaboration with appropriate care team members and the patient's provider was performed as part of comprehensive patient evaluation and provision of chronic care management services.     Goals Addressed    . "I need to to gain Medicaid and cope better with daily stressors" (pt-stated)       Current Barriers:  . Financial constraints . ADL IADL limitations . Mental Health Concerns  . Limited education about Medicaid enrollment process* . Memory Deficits . Inability to perform ADL's independently . Inability to perform IADL's independently  Clinical Social Work Clinical Goal(s):  [name] Over the next 90 days, client will work with SW to address concerns related to gaining Medicaid . Over the next 90 days, client will follow up with DSS Medicaid caseworker* as directed by SW  Interventions: . Patient interviewed and appropriate assessments performed . Provided mental health counseling with regard to patient was emotional throughout phone call due to her daily difficulty with mobility/weakness/balance. LCSW provided reflective listening and implemented appropriate interventions to help suppport patient and her emotional needs  . Patient reports that her Medicaid application is almost completed. She reports that she received a call from her Medicaid caseworker this week and discussed needed her to complete corrections on her Medicaid application. Patient successfully mailed requested and corrected documents to [facility] caseworker.  . CSW used motivational interviewing techniques to engage pt in  meaningful conversation and to assess pt's wilingess to implement appropriate self care daily to combat stressors/anxiety. CSW explored pt's coping skills and access to community services.  . Patient reports that her Medicaid application will take up to 90 days to process completely but that she has submitted all required documents.  . Discussed plans with patient for ongoing care management follow up and provided patient with direct contact information for care management team . Advised patient to keep in contact with DSS to follow up on Medicaid. Patient confirms receiving Food Stamps ($200 per month) . Assisted patient/caregiver with obtaining information about health plan benefits  Patient Self Care Activities:  . Attends all scheduled provider appointments . Calls provider office for new concerns or questions  Please see past updates related to this goal by clicking on the "Past Updates" button in the selected goal        Follow Up Plan: SW will follow up with patient by phone over the next 30 days  [name], [place], MSW, [email or web address] Phone: [phone]

## 2018-08-02 ENCOUNTER — Other Ambulatory Visit: Payer: Self-pay

## 2018-08-02 ENCOUNTER — Ambulatory Visit: Payer: Self-pay | Admitting: Licensed Clinical Social Worker

## 2018-08-02 ENCOUNTER — Ambulatory Visit: Payer: Self-pay | Admitting: Nurse Practitioner

## 2018-08-02 ENCOUNTER — Telehealth: Payer: Self-pay | Admitting: Nurse Practitioner

## 2018-08-02 DIAGNOSIS — I10 Essential (primary) hypertension: Secondary | ICD-10-CM

## 2018-08-02 NOTE — Telephone Encounter (Signed)
Pt presented in office wants to have [place] call her after lunch today if possible.

## 2018-08-02 NOTE — Chronic Care Management (AMB) (Signed)
  Care Management   Follow Up Note   08/02/2018 Name: Kristin Solis MRN: [id number] DOB: 05/09/1955  Referred by: [name], NP Reason for referral : Care Coordination   Kristin Solis is a 63 y.o. year old female who is a primary care patient of [name], [name], NP. The care management team was consulted for assistance with care management and care coordination needs.    Review of patient status, including review of consultants reports, relevant laboratory and other test results, and collaboration with appropriate care team members and the patient's provider was performed as part of comprehensive patient evaluation and provision of chronic care management services.    Goals Addressed    . "I need to to gain Medicaid and cope better with daily stressors" (pt-stated)       Current Barriers:  . Financial constraints . ADL IADL limitations . Mental Health Concerns  . Limited education about Medicaid enrollment process* . Memory Deficits . Inability to perform ADL's independently . Inability to perform IADL's independently  Clinical Social Work Clinical Goal(s):  [name] Over the next 90 days, client will work with SW to address concerns related to gaining Medicaid . Over the next 90 days, client will follow up with DSS Medicaid caseworker* as directed by SW  Interventions: . Patient interviewed and appropriate assessments performed . Provided mental health counseling with regard to patient was emotional throughout phone call due to her daily difficulty with mobility/weakness/balance. LCSW provided reflective listening and implemented appropriate interventions to help suppport patient and her emotional needs  . Patient reports that her Medicaid application is completed. She reports that she received a call from her Medicaid caseworker this week and was informed that her Medicaid was approved. Patient received a temporary Medicaid card in the mail but did not receive a letter which  stated who is her newly assigned Medicaid caseworker or what type of Medicaid coverage she was approved for. Patient reports that DSS reported that they will send this information.  . CSW used motivational interviewing techniques to engage pt in meaningful conversation and to assess pt's wilingess to implement appropriate self care daily to combat stressors/anxiety. CSW explored pt's coping skills and access to community services.  . Patient reports that her Medicaid application will take up to 90 days to process completely but that she has submitted all required documents.  . Discussed plans with patient for ongoing care management follow up and provided patient with direct contact information for care management team . Advised patient to keep in contact with DSS to follow up on Medicaid. Patient confirms receiving Food Stamps ($200 per month) . Assisted patient/caregiver with obtaining information about health plan benefits  Patient Self Care Activities:  . Attends all scheduled provider appointments . Calls provider office for new concerns or questions  Please see past updates related to this goal by clicking on the "Past Updates" button in the selected goal      The care management team will reach out to the patient again over the next 30 days.   [name], BSW, MSW, [facility] Care Management [email or web address] Phone: [phone]

## 2018-08-09 ENCOUNTER — Ambulatory Visit: Payer: BLUE CROSS/BLUE SHIELD | Admitting: Nurse Practitioner

## 2018-08-17 ENCOUNTER — Telehealth (HOSPITAL_COMMUNITY): Payer: Self-pay

## 2018-08-17 NOTE — Telephone Encounter (Signed)
Called to schedule f/u. Pt doesn't want to schedule at this time. She will give [place] a call when she is ready to come in. AW

## 2018-09-05 ENCOUNTER — Ambulatory Visit: Payer: Self-pay | Admitting: Licensed Clinical Social Worker

## 2018-09-05 ENCOUNTER — Other Ambulatory Visit: Payer: Self-pay

## 2018-09-05 DIAGNOSIS — I10 Essential (primary) hypertension: Secondary | ICD-10-CM

## 2018-09-05 NOTE — Chronic Care Management (AMB) (Signed)
  Care Management   Follow Up Note   09/05/2018 Name: Kristin Solis MRN: [id number] DOB: 03/26/1955  Referred by: [name], NP Reason for referral : Care Coordination   Kristin Solis is a 63 y.o. year old female who is a primary care patient of [name], [name], NP. The care management team was consulted for assistance with care management and care coordination needs.    Review of patient status, including review of consultants reports, relevant laboratory and other test results, and collaboration with appropriate care team members and the patient's provider was performed as part of comprehensive patient evaluation and provision of chronic care management services.    Goals Addressed    . "I need to to gain Medicaid and cope better with daily stressors" (pt-stated)       Current Barriers:  . Financial constraints . ADL IADL limitations . Mental Health Concerns  . Limited education about Medicaid enrollment process* . Memory Deficits . Inability to perform ADL's independently . Inability to perform IADL's independently  Clinical Social Work Clinical Goal(s):  [name] Over the next 90 days, client will work with SW to address concerns related to gaining financial assistance education and getting enrolled in social security.  . Over the next 90 days, client will follow up with DSS Medicaid caseworker* as directed by SW  Interventions: . Patient interviewed and appropriate assessments performed . Provided mental health counseling with regard to patient was emotional throughout phone call due to her daily difficulty with mobility/weakness/balance. LCSW provided reflective listening and implemented appropriate interventions to help suppport patient and her emotional needs  . Patient reports that she was only approved for Medicaid [facility] but that she would be able to get Full Adult Medicaid if she were deemed disabled.  . CSW used motivational interviewing techniques to  engage pt in meaningful conversation and to assess pt's wilingess to implement appropriate self care daily to combat stressors/anxiety. CSW explored pt's coping skills and access to community services.  . Patient reports having an appointment at the social security office on 09/23/2018. [name] Discussed plans with patient for ongoing care management follow up and provided patient with direct contact information for care management team . Advised patient to keep in contact with DSS to follow up on Medicaid. Patient confirms receiving Food Stamps ($200 per month) . Assisted patient/caregiver with obtaining information about health plan benefits  Patient Self Care Activities:  . Attends all scheduled provider appointments . Calls provider office for new concerns or questions  Please see past updates related to this goal by clicking on the "Past Updates" button in the selected goal      The care management team will reach out to the patient again over the next 60 days.   [name], BSW, MSW, [facility] Care Management [email or web address] Phone: [phone]

## 2018-09-08 ENCOUNTER — Ambulatory Visit: Payer: BLUE CROSS/BLUE SHIELD | Admitting: Adult Health

## 2018-09-14 ENCOUNTER — Ambulatory Visit: Payer: BLUE CROSS/BLUE SHIELD | Admitting: Neurology

## 2018-09-28 ENCOUNTER — Ambulatory Visit (INDEPENDENT_AMBULATORY_CARE_PROVIDER_SITE_OTHER): Payer: Medicaid Other | Admitting: Nurse Practitioner

## 2018-09-28 ENCOUNTER — Other Ambulatory Visit: Payer: Self-pay

## 2018-09-28 ENCOUNTER — Encounter: Payer: Self-pay | Admitting: Nurse Practitioner

## 2018-09-28 VITALS — BP 128/78 | HR 88 | Temp 99.1°F | Ht 61.0 in | Wt 219.0 lb

## 2018-09-28 DIAGNOSIS — R7301 Impaired fasting glucose: Secondary | ICD-10-CM | POA: Diagnosis not present

## 2018-09-28 DIAGNOSIS — Z0000 Encounter for general adult medical examination without abnormal findings: Secondary | ICD-10-CM | POA: Diagnosis not present

## 2018-09-28 DIAGNOSIS — I10 Essential (primary) hypertension: Secondary | ICD-10-CM | POA: Diagnosis not present

## 2018-09-28 DIAGNOSIS — J4541 Moderate persistent asthma with (acute) exacerbation: Secondary | ICD-10-CM | POA: Diagnosis not present

## 2018-09-28 DIAGNOSIS — E782 Mixed hyperlipidemia: Secondary | ICD-10-CM | POA: Diagnosis not present

## 2018-09-28 DIAGNOSIS — Z8673 Personal history of transient ischemic attack (TIA), and cerebral infarction without residual deficits: Secondary | ICD-10-CM | POA: Diagnosis not present

## 2018-09-28 NOTE — Assessment & Plan Note (Signed)
Ongoing deficit to right upper extremity with dependent edema present.  Unable to afford PT and no current coverage, will work with CCM team to see if assistance available to avoid further deficit and help with strengthening of limb.  Recommend wearing arm sleeve to help with edema.  Return in 2 months or sooner if worsening.  Maintain BP <130/90, A1C < 6.5%, and LDL < 70 for stroke prevention.

## 2018-09-28 NOTE — Assessment & Plan Note (Signed)
Check A1C 

## 2018-09-28 NOTE — Assessment & Plan Note (Signed)
Chronic, ongoing.  Continue current medication regimen and adjust as needed.  Consider spirometry next visit.   

## 2018-09-28 NOTE — Assessment & Plan Note (Signed)
Chronic, ongoing.  Continue current medication regimen and adjust as needed. Lipid panel today. 

## 2018-09-28 NOTE — Patient Instructions (Signed)

## 2018-09-28 NOTE — Assessment & Plan Note (Signed)
Chronic, ongoing with goal BP at home on readings and on repeat in office today (initial in office elevated).  Continue current medication regimen and adjust as needed.  Continue daily BP checks at home.  Discussed strict goal BP due to history of stroke.  Return in 2 months.

## 2018-09-28 NOTE — Progress Notes (Signed)
BP 128/78 (BP Location: Left Arm, Patient Position: Sitting)   Pulse 88 Comment: apical  Temp 99.1 F (37.3 C) (Oral)   Ht 5\' 1"  (1.549 m)   Wt 219 lb (99.3 kg)   SpO2 97%   BMI 41.38 kg/m    Subjective:    Patient ID: Kristin Solis, female    DOB: 03/29/1955, 63 y.o.   MRN: [id number]  HPI: Kristin Solis is a 63 y.o. female presenting on 09/28/2018 for comprehensive medical examination. Current medical complaints include:none  She currently lives with: husband Menopausal Symptoms: no   HYPERTENSION / HYPERLIPIDEMIA History of stroke in March.  Continues on Lisinopril, Atorvastatin, Brilinta, and ASA.  Has not been able to see neurologist due to current coverage.  States she was told to stop taking her Brilinta in September.  Is scheduled to go 27th of this month to neurology but unsure if able to cover cost.  Has not been receiving PT, due to current health coverage and would like this.  Working with CCM team.  Currently using walker and difficulty with stairs + continued right arm decreased mobility and dependent edema (dominant hand) from stroke. Satisfied with current treatment? yes Duration of hypertension: chronic BP monitoring frequency: daily BP range: this morning 139/60 (averaging 130's over 70's) BP medication side effects: no Duration of hyperlipidemia: chronic Cholesterol medication side effects: no Cholesterol supplements: none Medication compliance: good compliance Aspirin: yes Recent stressors: no Recurrent headaches: no Visual changes: no Palpitations: no Dyspnea: no Chest pain: no Lower extremity edema: no Dizzy/lightheaded: no   ASTHMA Using her Advair and Albuterol.  Occasional SOB when walking in house, which improves with Albuterol.  Asthma status: stable Satisfied with current treatment?: yes Albuterol/rescue inhaler frequency:  Dyspnea frequency: occasional Wheezing frequency: occasional Cough frequency: none Nocturnal symptom frequency:  none Limitation of activity: no Current upper respiratory symptoms: no Triggers: seasonal Home peak flows: none Last Spirometry: unknown Failed/intolerant to following asthma meds:  Asthma meds in past: unknown Aerochamber/spacer use: no Visits to ER or Urgent Care in past year: no Pneumovax: up to date Influenza: Up to Date  Depression Screen done today and results listed below:  Depression screen [facility] 2/9 09/28/2018 06/27/2018 06/06/2018 03/26/2017 01/15/2017  Decreased Interest 0 0 0 0 0  Down, Depressed, Hopeless 0 0 0 0 0  PHQ - 2 Score 0 0 0 0 0  Altered sleeping 0 - - - 0  Tired, decreased energy 0 - - - 0  Change in appetite 0 - - - 0  Feeling bad or failure about yourself  0 - - - 0  Trouble concentrating 0 - - - 0  Moving slowly or fidgety/restless 0 - - - 0  Suicidal thoughts 0 - - - 0  PHQ-9 Score 0 - - - 0    The patient does not have a history of falls. I did not complete a risk assessment for falls. A plan of care for falls was not documented.   Past Medical History:  Past Medical History:  Diagnosis Date  . Asthma   . Hypertension     Surgical History:  Past Surgical History:  Procedure Laterality Date  . ABDOMINAL HYSTERECTOMY  2005   total  . IR CT HEAD LTD  04/18/2018  . IR INTRAVSC STENT CERV CAROTID W/O EMB-PROT MOD SED INC ANGIO  04/18/2018  . IR PERCUTANEOUS ART THROMBECTOMY/INFUSION INTRACRANIAL INC DIAG ANGIO  04/18/2018  . OOPHORECTOMY    . RADIOLOGY WITH ANESTHESIA  N/A 04/17/2018   Procedure: RADIOLOGY WITH ANESTHESIA;  Surgeon: Julieanne Cottoneveshwar, Sanjeev, MD;  Location: [facility] OR;  Service: Radiology;  Laterality: N/A;    Medications:  Current Outpatient Medications on File Prior to Visit  Medication Sig  . albuterol (PROVENTIL HFA;VENTOLIN HFA) 108 (90 Base) MCG/ACT inhaler Inhale 2 puffs into the lungs every 6 (six) hours as needed for wheezing or shortness of breath.  [name]. aspirin EC 81 MG EC tablet Take 1 tablet (81 mg total) by mouth daily.  [name]. atorvastatin  (LIPITOR) 40 MG tablet Take 1 tablet (40 mg total) by mouth daily at 6 PM.  . fluticasone (FLONASE) 50 MCG/ACT nasal spray Place 2 sprays into both nostrils daily.  . Fluticasone-Salmeterol (ADVAIR DISKUS) 250-50 MCG/DOSE AEPB Inhale 1 puff into the lungs 2 (two) times daily.  [name]. lisinopril (PRINIVIL,ZESTRIL) 5 MG tablet Take 1 tablet (5 mg total) by mouth daily.  . ticagrelor (BRILINTA) 90 MG TABS tablet Take 1 tablet (90 mg total) by mouth 2 (two) times daily.   No current facility-administered medications on file prior to visit.     Allergies:  No Known Allergies  Social History:  Social History   Socioeconomic History  . Marital status: Married    Spouse name: Not on file  . Number of children: Not on file  . Years of education: Not on file  . Highest education level: Not on file  Occupational History  . Not on file  Social Needs  . Financial resource strain: Not on file  . Food insecurity    Worry: Not on file    Inability: Not on file  . Transportation needs    Medical: Not on file    Non-medical: Not on file  Tobacco Use  . Smoking status: Never Smoker  . Smokeless tobacco: Never Used  Substance and Sexual Activity  . Alcohol use: No  . Drug use: No  . Sexual activity: Never  Lifestyle  . Physical activity    Days per week: Not on file    Minutes per session: Not on file  . Stress: Not on file  Relationships  . Social Musicianconnections    Talks on phone: Not on file    Gets together: Not on file    Attends religious service: Not on file    Active member of club or organization: Not on file    Attends meetings of clubs or organizations: Not on file    Relationship status: Not on file  . Intimate partner violence    Fear of current or ex partner: Not on file    Emotionally abused: Not on file    Physically abused: Not on file    Forced sexual activity: Not on file  Other Topics Concern  . Not on file  Social History Narrative  . Not on file   Social History    Tobacco Use  Smoking Status Never Smoker  Smokeless Tobacco Never Used   Social History   Substance and Sexual Activity  Alcohol Use No    Family History:  Family History  Problem Relation Age of Onset  . Cancer Mother        lung  . Cancer Father        brain  . Diabetes Sister   . Alcohol abuse Brother   . Stroke Son        x2  . CAD Maternal Grandmother   . Emphysema Paternal Grandfather   . Heart disease Sister  CHF    Past medical history, surgical history, medications, allergies, family history and social history reviewed with patient today and changes made to appropriate areas of the chart.   Review of Systems - negative All other ROS negative except what is listed above and in the HPI.      Objective:    BP 128/78 (BP Location: Left Arm, Patient Position: Sitting)   Pulse 88 Comment: apical  Temp 99.1 F (37.3 C) (Oral)   Ht 5\' 1"  (1.549 m)   Wt 219 lb (99.3 kg)   SpO2 97%   BMI 41.38 kg/m   Wt Readings from Last 3 Encounters:  09/28/18 219 lb (99.3 kg)  05/09/18 223 lb (101.2 kg)  04/25/18 233 lb (105.7 kg)    Physical Exam Vitals signs and nursing note reviewed.  Constitutional:      General: She is awake. She is not in acute distress.    Appearance: She is well-developed. She is obese. She is not ill-appearing.  HENT:     Head: Normocephalic.     Right Ear: Hearing, tympanic membrane, ear canal and external ear normal.     Left Ear: Hearing, tympanic membrane, ear canal and external ear normal.     Nose: Nose normal.     Mouth/Throat:     Mouth: Mucous membranes are moist.     Pharynx: Oropharynx is clear.  Eyes:     General: Lids are normal.        Right eye: No discharge.        Left eye: No discharge.     Extraocular Movements: Extraocular movements intact.     Conjunctiva/sclera: Conjunctivae normal.     Pupils: Pupils are equal, round, and reactive to light.     Visual Fields: Right eye visual fields normal and left eye  visual fields normal.  Neck:     Musculoskeletal: Normal range of motion and neck supple.     Thyroid: No thyromegaly.     Vascular: No carotid bruit.  Cardiovascular:     Rate and Rhythm: Normal rate and regular rhythm.     Heart sounds: Normal heart sounds. No murmur. No gallop.   Pulmonary:     Effort: Pulmonary effort is normal. No accessory muscle usage or respiratory distress.     Breath sounds: Normal breath sounds.  Chest:     Comments: Deferred per patient request at this visit. Abdominal:     General: Bowel sounds are normal.     Palpations: Abdomen is soft. There is no hepatomegaly or splenomegaly.     Tenderness: There is no abdominal tenderness.  Musculoskeletal:     Right hand: She exhibits decreased range of motion and swelling. She exhibits no tenderness and normal capillary refill. Normal sensation noted. Decreased strength noted.     Left hand: She exhibits normal range of motion, no tenderness, normal capillary refill and no swelling. Normal sensation noted. Normal strength noted.     Right lower leg: Edema (trace) present.     Left lower leg: Edema (trace) present.     Comments: Dependent edema present to lower aspect of right arm and hand (stroke affected side) with decrease strength and ROM present.  LUE full ROM present.  LLE and RLE wNL strength and ROM.  Lymphadenopathy:     Cervical: No cervical adenopathy.  Skin:    General: Skin is warm and dry.  Neurological:     Mental Status: She is alert and oriented to person, place,  and time.     Cranial Nerves: Cranial nerves are intact.     Gait: Gait is intact.     Deep Tendon Reflexes:     Reflex Scores:      Brachioradialis reflexes are 1+ on the right side and 1+ on the left side.      Patellar reflexes are 1+ on the right side and 1+ on the left side.    Comments: Gait stable with walker.  Psychiatric:        Attention and Perception: Attention normal.        Mood and Affect: Mood normal.        Speech:  Speech normal.        Behavior: Behavior normal. Behavior is cooperative.        Thought Content: Thought content normal.        Cognition and Memory: Cognition normal.        Judgment: Judgment normal.     Results for orders placed or performed during the hospital encounter of 04/17/18  MRSA PCR Screening   Specimen: Nasal Mucosa; Nasopharyngeal  Result Value Ref Range   MRSA by PCR NEGATIVE NEGATIVE  HIV antibody (Routine Testing)  Result Value Ref Range   HIV Screen 4th Generation wRfx Non Reactive Non Reactive  Hemoglobin A1c  Result Value Ref Range   Hgb A1c MFr Bld 5.7 (H) 4.8 - 5.6 %   Mean Plasma Glucose 116.89 mg/dL  Lipid panel  Result Value Ref Range   Cholesterol 186 0 - 200 mg/dL   Triglycerides 90 <130 mg/dL   HDL 66 >86 mg/dL   Total CHOL/HDL Ratio 2.8 RATIO   VLDL 18 0 - 40 mg/dL   LDL Cholesterol 578 (H) 0 - 99 mg/dL  CBC with Differential/Platelet  Result Value Ref Range   WBC 12.9 (H) 4.0 - 10.5 K/uL   RBC 4.60 3.87 - 5.11 MIL/uL   Hemoglobin 13.1 12.0 - 15.0 g/dL   HCT 46.9 62.9 - 52.8 %   MCV 88.3 80.0 - 100.0 fL   MCH 28.5 26.0 - 34.0 pg   MCHC 32.3 30.0 - 36.0 g/dL   RDW 41.3 24.4 - 01.0 %   Platelets 327 150 - 400 K/uL   nRBC 0.0 0.0 - 0.2 %   Neutrophils Relative % 93 %   Neutro Abs 12.0 (H) 1.7 - 7.7 K/uL   Lymphocytes Relative 4 %   Lymphs Abs 0.5 (L) 0.7 - 4.0 K/uL   Monocytes Relative 2 %   Monocytes Absolute 0.2 0.1 - 1.0 K/uL   Eosinophils Relative 0 %   Eosinophils Absolute 0.0 0.0 - 0.5 K/uL   Basophils Relative 0 %   Basophils Absolute 0.0 0.0 - 0.1 K/uL   Immature Granulocytes 1 %   Abs Immature Granulocytes 0.10 (H) 0.00 - 0.07 K/uL  Basic metabolic panel  Result Value Ref Range   Sodium 136 135 - 145 mmol/L   Potassium 3.3 (L) 3.5 - 5.1 mmol/L   Chloride 106 98 - 111 mmol/L   CO2 23 22 - 32 mmol/L   Glucose, Bld 162 (H) 70 - 99 mg/dL   BUN 16 8 - 23 mg/dL   Creatinine, Ser 2.72 0.44 - 1.00 mg/dL   Calcium 8.3 (L) 8.9 -  10.3 mg/dL   GFR calc non Af Amer >60 >60 mL/min   GFR calc Af Amer >60 >60 mL/min   Anion gap 7 5 - 15  Triglycerides  Result Value Ref Range  Triglycerides 97 <150 mg/dL  CBC  Result Value Ref Range   WBC 9.4 4.0 - 10.5 K/uL   RBC 3.68 (L) 3.87 - 5.11 MIL/uL   Hemoglobin 10.6 (L) 12.0 - 15.0 g/dL   HCT 16.133.4 (L) 09.636.0 - 04.546.0 %   MCV 90.8 80.0 - 100.0 fL   MCH 28.8 26.0 - 34.0 pg   MCHC 31.7 30.0 - 36.0 g/dL   RDW 40.913.4 81.111.5 - 91.415.5 %   Platelets 252 150 - 400 K/uL   nRBC 0.0 0.0 - 0.2 %  Basic metabolic panel  Result Value Ref Range   Sodium 140 135 - 145 mmol/L   Potassium 3.1 (L) 3.5 - 5.1 mmol/L   Chloride 112 (H) 98 - 111 mmol/L   CO2 22 22 - 32 mmol/L   Glucose, Bld 115 (H) 70 - 99 mg/dL   BUN 11 8 - 23 mg/dL   Creatinine, Ser 7.820.86 0.44 - 1.00 mg/dL   Calcium 8.1 (L) 8.9 - 10.3 mg/dL   GFR calc non Af Amer >60 >60 mL/min   GFR calc Af Amer >60 >60 mL/min   Anion gap 6 5 - 15  CBC with Differential/Platelet  Result Value Ref Range   WBC 11.5 (H) 4.0 - 10.5 K/uL   RBC 4.00 3.87 - 5.11 MIL/uL   Hemoglobin 11.6 (L) 12.0 - 15.0 g/dL   HCT 95.636.0 21.336.0 - 08.646.0 %   MCV 90.0 80.0 - 100.0 fL   MCH 29.0 26.0 - 34.0 pg   MCHC 32.2 30.0 - 36.0 g/dL   RDW 57.813.2 46.911.5 - 62.915.5 %   Platelets 231 150 - 400 K/uL   nRBC 0.0 0.0 - 0.2 %   Neutrophils Relative % 84 %   Neutro Abs 9.8 (H) 1.7 - 7.7 K/uL   Lymphocytes Relative 6 %   Lymphs Abs 0.6 (L) 0.7 - 4.0 K/uL   Monocytes Relative 8 %   Monocytes Absolute 0.9 0.1 - 1.0 K/uL   Eosinophils Relative 1 %   Eosinophils Absolute 0.1 0.0 - 0.5 K/uL   Basophils Relative 0 %   Basophils Absolute 0.0 0.0 - 0.1 K/uL   Immature Granulocytes 1 %   Abs Immature Granulocytes 0.06 0.00 - 0.07 K/uL  Basic metabolic panel  Result Value Ref Range   Sodium 141 135 - 145 mmol/L   Potassium 3.4 (L) 3.5 - 5.1 mmol/L   Chloride 110 98 - 111 mmol/L   CO2 23 22 - 32 mmol/L   Glucose, Bld 93 70 - 99 mg/dL   BUN 14 8 - 23 mg/dL   Creatinine, Ser 5.280.96  0.44 - 1.00 mg/dL   Calcium 8.5 (L) 8.9 - 10.3 mg/dL   GFR calc non Af Amer >60 >60 mL/min   GFR calc Af Amer >60 >60 mL/min   Anion gap 8 5 - 15  Magnesium  Result Value Ref Range   Magnesium 1.9 1.7 - 2.4 mg/dL  Phosphorus  Result Value Ref Range   Phosphorus 2.2 (L) 2.5 - 4.6 mg/dL  CBC  Result Value Ref Range   WBC 6.1 4.0 - 10.5 K/uL   RBC 4.14 3.87 - 5.11 MIL/uL   Hemoglobin 11.6 (L) 12.0 - 15.0 g/dL   HCT 41.337.1 24.436.0 - 01.046.0 %   MCV 89.6 80.0 - 100.0 fL   MCH 28.0 26.0 - 34.0 pg   MCHC 31.3 30.0 - 36.0 g/dL   RDW 27.213.1 53.611.5 - 64.415.5 %   Platelets 276 150 - 400  K/uL   nRBC 0.0 0.0 - 0.2 %  Basic metabolic panel  Result Value Ref Range   Sodium 140 135 - 145 mmol/L   Potassium 3.4 (L) 3.5 - 5.1 mmol/L   Chloride 106 98 - 111 mmol/L   CO2 26 22 - 32 mmol/L   Glucose, Bld 115 (H) 70 - 99 mg/dL   BUN 13 8 - 23 mg/dL   Creatinine, Ser 1.61 0.44 - 1.00 mg/dL   Calcium 9.0 8.9 - 09.6 mg/dL   GFR calc non Af Amer >60 >60 mL/min   GFR calc Af Amer >60 >60 mL/min   Anion gap 8 5 - 15  ECHOCARDIOGRAM COMPLETE  Result Value Ref Range   Weight 3,952 oz   Height 63 in   BP 106/59 mmHg      Assessment & Plan:   Problem List Items Addressed This Visit      Cardiovascular and Mediastinum   Essential hypertension, benign    Chronic, ongoing with goal BP at home on readings and on repeat in office today (initial in office elevated).  Continue current medication regimen and adjust as needed.  Continue daily BP checks at home.  Discussed strict goal BP due to history of stroke.  Return in 2 months.        Relevant Orders   CBC with Differential/Platelet   Comprehensive metabolic panel   Lipid Panel w/o Chol/HDL Ratio     Respiratory   Asthma    Chronic, ongoing.  Continue current medication regimen and adjust as needed.  Consider spirometry next visit.          Endocrine   IFG (impaired fasting glucose)    Check A1C      Relevant Orders   HgB A1c     Other   History  of stroke    Ongoing deficit to right upper extremity with dependent edema present.  Unable to afford PT and no current coverage, will work with CCM team to see if assistance available to avoid further deficit and help with strengthening of limb.  Recommend wearing arm sleeve to help with edema.  Return in 2 months or sooner if worsening.  Maintain BP <130/90, A1C < 6.5%, and LDL < 70 for stroke prevention.      Mixed hyperlipidemia    Chronic, ongoing.  Continue current medication regimen and adjust as needed.  Lipid panel today.        Relevant Orders   Comprehensive metabolic panel   Lipid Panel w/o Chol/HDL Ratio    Other Visit Diagnoses    Encounter for annual physical exam    -  Primary   Relevant Orders   TSH       Follow up plan: Return in about 2 months (around 11/28/2018) for HTN/HLD.   LABORATORY TESTING:  - Pap smear: not applicable  IMMUNIZATIONS:   - Tdap: Tetanus vaccination status reviewed: last tetanus booster within 10 years. - Influenza: Up to date - Pneumovax: Up to date - Prevnar: Not applicable - HPV: Not applicable - Zostavax vaccine: Refused  SCREENING: -Mammogram: Up to date  - Colonoscopy: Up to date  - Bone Density: Not applicable  -Hearing Test: Not applicable  -Spirometry: Not applicable   PATIENT COUNSELING:   Advised to take 1 mg of folate supplement per day if capable of pregnancy.   Sexuality: Discussed sexually transmitted diseases, partner selection, use of condoms, avoidance of unintended pregnancy  and contraceptive alternatives.   Advised to avoid cigarette  smoking.  I discussed with the patient that most people either abstain from alcohol or drink within safe limits (<=14/week and <=4 drinks/occasion for males, <=7/weeks and <= 3 drinks/occasion for females) and that the risk for alcohol disorders and other health effects rises proportionally with the number of drinks per week and how often a drinker exceeds daily limits.   Discussed cessation/primary prevention of drug use and availability of treatment for abuse.   Diet: Encouraged to adjust caloric intake to maintain  or achieve ideal body weight, to reduce intake of dietary saturated fat and total fat, to limit sodium intake by avoiding high sodium foods and not adding table salt, and to maintain adequate dietary potassium and calcium preferably from fresh fruits, vegetables, and low-fat dairy products.    stressed the importance of regular exercise  Injury prevention: Discussed safety belts, safety helmets, smoke detector, smoking near bedding or upholstery.   Dental health: Discussed importance of regular tooth brushing, flossing, and dental visits.    NEXT PREVENTATIVE PHYSICAL DUE IN 1 YEAR. Return in about 2 months (around 11/28/2018) for HTN/HLD.

## 2018-09-29 ENCOUNTER — Telehealth: Payer: Self-pay

## 2018-09-29 ENCOUNTER — Ambulatory Visit: Payer: Self-pay | Admitting: *Deleted

## 2018-09-29 LAB — LIPID PANEL W/O CHOL/HDL RATIO
Cholesterol, Total: 128 mg/dL (ref 100–199)
HDL: 63 mg/dL (ref >39)
LDL Calculated: 49 mg/dL (ref 0–99)
Triglycerides: 78 mg/dL (ref 0–149)
VLDL Cholesterol Cal: 16 mg/dL (ref 5–40)

## 2018-09-29 LAB — HEMOGLOBIN A1C
Est. average glucose Bld gHb Est-mCnc: 114 mg/dL
Hgb A1c MFr Bld: 5.6 % (ref 4.8–5.6)

## 2018-09-29 LAB — COMPREHENSIVE METABOLIC PANEL
ALT: 17 IU/L (ref 0–32)
AST: 17 IU/L (ref 0–40)
Albumin/Globulin Ratio: 1.7 (ref 1.2–2.2)
Albumin: 3.9 g/dL (ref 3.8–4.8)
Alkaline Phosphatase: 125 IU/L — ABNORMAL HIGH (ref 39–117)
BUN/Creatinine Ratio: 13 (ref 12–28)
BUN: 13 mg/dL (ref 8–27)
Bilirubin Total: 0.4 mg/dL (ref 0.0–1.2)
CO2: 24 mmol/L (ref 20–29)
Calcium: 9.5 mg/dL (ref 8.7–10.3)
Chloride: 109 mmol/L — ABNORMAL HIGH (ref 96–106)
Creatinine, Ser: 0.99 mg/dL (ref 0.57–1.00)
GFR calc Af Amer: 71 mL/min/{1.73_m2} (ref >59)
GFR calc non Af Amer: 61 mL/min/{1.73_m2} (ref >59)
Globulin, Total: 2.3 g/dL (ref 1.5–4.5)
Glucose: 139 mg/dL — ABNORMAL HIGH (ref 65–99)
Potassium: 3.7 mmol/L (ref 3.5–5.2)
Sodium: 146 mmol/L — ABNORMAL HIGH (ref 134–144)
Total Protein: 6.2 g/dL (ref 6.0–8.5)

## 2018-09-29 LAB — CBC WITH DIFFERENTIAL/PLATELET
Basophils Absolute: 0 10*3/uL (ref 0.0–0.2)
Basos: 1 %
EOS (ABSOLUTE): 0.1 10*3/uL (ref 0.0–0.4)
Eos: 1 %
Hematocrit: 41.3 % (ref 34.0–46.6)
Hemoglobin: 13.4 g/dL (ref 11.1–15.9)
Immature Grans (Abs): 0 10*3/uL (ref 0.0–0.1)
Immature Granulocytes: 1 %
Lymphocytes Absolute: 0.7 10*3/uL (ref 0.7–3.1)
Lymphs: 11 %
MCH: 27.5 pg (ref 26.6–33.0)
MCHC: 32.4 g/dL (ref 31.5–35.7)
MCV: 85 fL (ref 79–97)
Monocytes Absolute: 0.4 10*3/uL (ref 0.1–0.9)
Monocytes: 6 %
Neutrophils Absolute: 5.1 10*3/uL (ref 1.4–7.0)
Neutrophils: 80 %
Platelets: 296 10*3/uL (ref 150–450)
RBC: 4.88 x10E6/uL (ref 3.77–5.28)
RDW: 14.5 % (ref 11.7–15.4)
WBC: 6.3 10*3/uL (ref 3.4–10.8)

## 2018-09-29 LAB — TSH: TSH: 1.32 u[IU]/mL (ref 0.450–4.500)

## 2018-09-29 NOTE — Chronic Care Management (AMB) (Signed)
  Chronic Care Management   Follow Up Note   09/29/2018 Name: Kristin Solis MRN: [id number] DOB: August 30, 1955  Referred by: [name], NP Reason for referral : Chronic Care Management (CVA) and Care Coordination (PT)   Kristin Solis is a 63 y.o. year old female who is a primary care patient of [name], [name], NP. The CCM team was consulted for assistance with chronic disease management and care coordination needs.    Review of patient status, including review of consultants reports, relevant laboratory and other test results, and collaboration with appropriate care team members and the patient's provider was performed as part of comprehensive patient evaluation and provision of chronic care management services.      Goals Addressed            This Visit's Progress   . "I need help getting therapy" (pt-stated)       Current Barriers:  [name] Knowledge Deficits related to options for therapy post CVa . Film/video editor.   Nurse Case Manager Clinical Goal(s):  [name] Over the next 30 days, patient will verbalize understanding of plan for outpatient therapy options  Interventions:  . Evaluation of current treatment plan related to post CVA therapy needs and patient's adherence to plan as established by provider. Nash Dimmer with Outpatient PT department at [facility] regarding outpatient PT options/coverage. Spoke with Debbie.Patient would have to pay 150-300$ for each visit. Patient assistance number given for patient to assist with medical bills over 5,000$ [phone]. Also given information about the [facility] through [facility] PT student [phone]. [name] Placed a call to the [facility] and left a message, also emailed the contact listed.  . Reviewed with patient different PT options, OOP expense of [facility] PT not an option at this time. Will await information from [facility]. Gave patient information for [facility] for  cost reduction. Patient does not want to change PCPs at this time. Gave patient the number for patient assistance through [facility] for medical bills.  . Discussed plans with patient for ongoing care management follow up and provided patient with direct contact information for care management team. . Pharmacy referral sent for medication cost assistance(discussed with patient)    Patient Self Care Activities:  . Currently UNABLE TO independently perform ADL's including self care activities such as bathing, dressing because of stroke deficits   Plan:  . RNCM will follow up with patient over the next 30 days to ensure initiation of services or options provided by outpatient PT   Please see past updates related to this goal by clicking on the "Past Updates" button in the selected goal          The care management team will reach out to the patient again over the next 30 days.    [name] Oma Marzan RN, BSN Nurse Case Editor, commissioning Family Practice/[facility] Care Management  [phone]) Business Mobile

## 2018-09-30 ENCOUNTER — Other Ambulatory Visit: Payer: Self-pay | Admitting: Nurse Practitioner

## 2018-09-30 ENCOUNTER — Ambulatory Visit: Payer: Self-pay | Admitting: Pharmacist

## 2018-09-30 ENCOUNTER — Ambulatory Visit: Payer: Self-pay | Admitting: *Deleted

## 2018-09-30 DIAGNOSIS — Z8673 Personal history of transient ischemic attack (TIA), and cerebral infarction without residual deficits: Secondary | ICD-10-CM

## 2018-09-30 NOTE — Chronic Care Management (AMB) (Signed)
Chronic Care Management   Note  09/30/2018 Name: Kristin Solis MRN: [id number] DOB: 1955-09-11   Subjective:  Kristin Solis is a 63 y.o. year old female who is a primary care patient of [name], [name], NP. The CCM team was consulted for assistance with chronic disease management and care coordination needs.    Received message from RN CM Janci Minor that patient has financial concerns with her medications.   Review of patient status, including review of consultants reports, laboratory and other test data, was performed as part of comprehensive evaluation and provision of chronic care management services.   Objective:  Lab Results  Component Value Date   CREATININE 0.99 09/28/2018   CREATININE 0.93 04/23/2018   CREATININE 0.96 04/20/2018    Lab Results  Component Value Date   HGBA1C 5.6 09/28/2018       Component Value Date/Time   CHOL 128 09/28/2018 1057   TRIG 78 09/28/2018 1057   HDL 63 09/28/2018 1057   CHOLHDL 2.8 04/18/2018 0454   VLDL 18 04/18/2018 0454   LDLCALC 49 09/28/2018 1057    Clinical ASCVD: Yes  - prior CVA  BP Readings from Last 3 Encounters:  09/28/18 128/78  05/09/18 135/73  04/25/18 128/74    No Known Allergies  Medications Reviewed Today    Reviewed by De Hollingshead, [facility] (Pharmacist) on 09/30/18 at 1321  Med List Status: <None>  Medication Order Taking? Sig Documenting Provider Last Dose Status Informant  albuterol (PROVENTIL HFA;VENTOLIN HFA) 108 (90 Base) MCG/ACT inhaler [id number] Yes Inhale 2 puffs into the lungs every 6 (six) hours as needed for wheezing or shortness of breath. Volney American, [place] Taking Active Spouse/Significant Other           Med Note Darnelle Maffucci, CATHERINE E   Fri Sep 30, 2018  1:20 PM) Q2-4 hours  aspirin EC 81 MG EC tablet [id number] Yes Take 1 tablet (81 mg total) by mouth daily. Rinehuls, Early Chars, PA-C Taking Active   atorvastatin (LIPITOR) 40 MG tablet [id number] Yes Take 1 tablet (40 mg total)  by mouth daily at 6 PM. Rinehuls, Early Chars, PA-C Taking Active   fluticasone (FLONASE) 50 MCG/ACT nasal spray [id number] Yes Place 2 sprays into both nostrils daily. Volney American, [place] Taking Active Spouse/Significant Other  Fluticasone-Salmeterol (ADVAIR DISKUS) 250-50 MCG/DOSE AEPB [id number] Yes Inhale 1 puff into the lungs 2 (two) times daily. Volney American, [place] Taking Active Spouse/Significant Other  lisinopril (PRINIVIL,ZESTRIL) 5 MG tablet [id number] Yes Take 1 tablet (5 mg total) by mouth daily. Marnee Guarneri T, NP Taking Active   ticagrelor (BRILINTA) 90 MG TABS tablet [id number] Yes Take 1 tablet (90 mg total) by mouth 2 (two) times daily. Rinehuls, Early Chars, PA-C Taking Active            Assessment:   Goals Addressed            This Visit's Progress     Patient Stated   . "I can't afford my medications" (pt-stated)       Current Barriers:  . Financial Barriers: patient is uninsured and reports cost concerns with a few of her medications.  . Notes that she cannot afford Advair, albuterol, or Flonase. Has a small and unsustainable supply at home.  . Notes that it is difficult for her to afford the $19/month for atorvastatin, lisinopril, and aspirin.  . Notes that she is receiving Brilinta from patient assistance; her nephew works for [employer] and assisted her  with this application previously.  [name]. Confirms that she is uninsured (no Medicaid yet) and [place]  Pharmacist Clinical Goal(s):  [name]. Over the next 30 days, patient will work with PharmD and providers to relieve medication access concerns  Interventions: . Comprehensive medication review completed; medication list updated in electronic medical record.  . Discussed Medication Management Clinic. Sent message to Milagros ReapBetty Klutz, PharmD, to please outreach patient to discuss process for obtaining medication assistance from this group.  Patient Self Care Activities:  . Patient will provide  necessary portions of application   Initial goal documentation        Plan: - Will outreach patient in 2-4 weeks to ensure connection with Medication Management Clinic  Catie Feliz Beamravis, PharmD Clinical Pharmacist [facility] [phone]

## 2018-09-30 NOTE — Progress Notes (Signed)
Referral to [facility] PT at  [facility]

## 2018-09-30 NOTE — Chronic Care Management (AMB) (Signed)
Chronic Care Management   Follow Up Note   09/30/2018 Name: Kristin Solis MRN: [id number] DOB: [birth year]  Referred by: [name], NP Reason for referral : Chronic Care Management (CVA) and Care Coordination (Medical Bills and PT)   Kristin Solis is a 63 y.o. year old female who is a primary care patient of [name], [name], NP. The CCM team was consulted for assistance with chronic disease management and care coordination needs.    Review of patient status, including review of consultants reports, relevant laboratory and other test results, and collaboration with appropriate care team members and the patient's provider was performed as part of comprehensive patient evaluation and provision of chronic care management services.    Outpatient Encounter Medications as of 09/30/2018  Medication Sig Note   albuterol (PROVENTIL HFA;VENTOLIN HFA) 108 (90 Base) MCG/ACT inhaler Inhale 2 puffs into the lungs every 6 (six) hours as needed for wheezing or shortness of breath. 09/30/2018: Q2-4 hours   aspirin EC 81 MG EC tablet Take 1 tablet (81 mg total) by mouth daily.    atorvastatin (LIPITOR) 40 MG tablet Take 1 tablet (40 mg total) by mouth daily at 6 PM.    fluticasone (FLONASE) 50 MCG/ACT nasal spray Place 2 sprays into both nostrils daily.    Fluticasone-Salmeterol (ADVAIR DISKUS) 250-50 MCG/DOSE AEPB Inhale 1 puff into the lungs 2 (two) times daily.    lisinopril (PRINIVIL,ZESTRIL) 5 MG tablet Take 1 tablet (5 mg total) by mouth daily.    ticagrelor (BRILINTA) 90 MG TABS tablet Take 1 tablet (90 mg total) by mouth 2 (two) times daily.    No facility-administered encounter medications on file as of 09/30/2018.      Goals Addressed            This Visit's Progress    "I need help getting therapy" (pt-stated)       Current Barriers:   Knowledge Deficits related to options for therapy post CVa  Financial Constraints.   Nurse Case Manager Clinical Goal(s):   Over  the next 30 days, patient will verbalize understanding of plan for outpatient therapy options  Interventions:   Evaluation of current treatment plan related to post CVA therapy needs and patient's adherence to plan as established by provider.   [facility] PT clinic through [facility] PT student [phone] Emailed and stated they could see patient but it would be through a virtual visit. Please have MD referral faxed over to [place] at [phone] and we would be more than happy to give the patient a call and place them on the waiting list at this time.   Called patient and made sure she had the ability to do virtual PT visit and would like to proceed with [facility] referral. Patient does want to proceed with referral.   Collaborated with PCP to give her this information.   Patient stated she called the number to receive patient assistance for her medical bills [phone] and she stated the rep is sending her paperwork in the mail.  Patient plans to call RNCM when she receives paper work to set a time to assist with filling them out.   Discussed plans with patient for ongoing care management follow up and provided patient with direct contact information for care management team.  Patient Self Care Activities:   Currently UNABLE TO independently perform ADL's including self care activities such as bathing, dressing because of stroke deficits   Plan:   RNCM will follow up  with patient over the next 30 days to ensure initiation of services or options provided by outpatient PT   Please see past updates related to this goal by clicking on the "Past Updates" button in the selected goal          The patient has been provided with contact information for the care management team and has been advised to call with any health related questions or concerns.    Ma RingsJanci Ahmaya Ostermiller RN, BSN Nurse Case Education officer, communityManager [facility]  [phone]) Business Mobile

## 2018-09-30 NOTE — Patient Instructions (Signed)
Thank you allowing the Chronic Care Management Team to be a part of your care! It was a pleasure speaking with you today!   CCM (Chronic Care Management) Team   Deletha Jaffee RN, BSN Nurse Care Coordinator  [phone]  Catie [facility] PharmD  Clinical Pharmacist  [phone]  [name] LCSW Clinical Social Worker [phone]  Goals Addressed            This Visit's Progress   . "I need help getting therapy" (pt-stated)       Current Barriers:  [name] Knowledge Deficits related to options for therapy post CVa . Film/video editor.   Nurse Case Manager Clinical Goal(s):  [name] Over the next 30 days, patient will verbalize understanding of plan for outpatient therapy options  Interventions:  . Evaluation of current treatment plan related to post CVA therapy needs and patient's adherence to plan as established by provider. Nash Dimmer with Outpatient PT department at [facility] regarding outpatient PT options/coverage. Spoke with Debbie.Patient would have to pay 150-300$ for each visit. Patient assistance number given for patient to assist with medical bills over 5,000$ [phone]. Also given information about the [facility] PT clinic through [facility] PT student [phone]. [name] Placed a call to the [place] clinic and left a message, also emailed the contact listed.  . Reviewed with patient different PT options, OOP expense of [facility] PT not an option at this time. Will await information from [facility]. Gave patient information for the [facility] for cost reduction. Patient does not want to change PCPs at this time. Gave patient the number for patient assistance through [facility] for medical bills.  . Discussed plans with patient for ongoing care management follow up and provided patient with direct contact information for care management team. . Pharmacy referral sent for medication cost assistance(discussed with patient)    Patient  Self Care Activities:  . Currently UNABLE TO independently perform ADL's including self care activities such as bathing, dressing because of stroke deficits   Plan:  . RNCM will follow up with patient over the next 30 days to ensure initiation of services or options provided by outpatient PT   Please see past updates related to this goal by clicking on the "Past Updates" button in the selected goal         The patient verbalized understanding of instructions provided today and declined a print copy of patient instruction materials.   The patient has been provided with contact information for the care management team and has been advised to call with any health related questions or concerns.

## 2018-09-30 NOTE — Patient Instructions (Signed)
Visit Information  Goals Addressed            This Visit's Progress     Patient Stated   . "I can't afford my medications" (pt-stated)       Current Barriers:  . Financial Barriers: patient is uninsured and reports cost concerns with a few of her medications.  . Notes that she cannot afford Advair, albuterol, or Flonase. Has a small and unsustainable supply at home.  . Notes that it is difficult for her to afford the $19/month for atorvastatin, lisinopril, and aspirin.  . Notes that she is receiving Brilinta from patient assistance; her nephew works for [employer] and assisted her with this application previously.  [name] Confirms that she is uninsured (no Medicaid yet) and lives in [place]):  [name] Over the next 30 days, patient will work with PharmD and providers to relieve medication access concerns  Interventions: . Comprehensive medication review completed; medication list updated in electronic medical record.  . Discussed Medication Management Clinic. Sent message to Langley Adie, PharmD, to please outreach patient to discuss process for obtaining medication assistance from this group.  Patient Self Care Activities:  . Patient will provide necessary portions of application   Initial goal documentation        The patient verbalized understanding of instructions provided today and declined a print copy of patient instruction materials.   Plan: - Will outreach patient in 2-4 weeks to ensure connection with Medication Management Clinic  Catie Darnelle Maffucci, PharmD Clinical Pharmacist [place] [phone]

## 2018-10-03 ENCOUNTER — Telehealth: Payer: Self-pay | Admitting: Pharmacy Technician

## 2018-10-03 NOTE — Telephone Encounter (Signed)
Provided patient with new patient packet to obtain Medication Management Clinic services.  Kristin Farabee J. Kristin Solis Care Manager Medication Management Clinic  

## 2018-10-05 ENCOUNTER — Ambulatory Visit: Payer: Self-pay | Admitting: *Deleted

## 2018-10-05 DIAGNOSIS — I10 Essential (primary) hypertension: Secondary | ICD-10-CM

## 2018-10-05 DIAGNOSIS — Z8673 Personal history of transient ischemic attack (TIA), and cerebral infarction without residual deficits: Secondary | ICD-10-CM

## 2018-10-08 NOTE — Chronic Care Management (AMB) (Signed)
Chronic Care Management   Follow Up Note   10/08/2018 Name: Kristin Solis MRN: [id number] DOB: [birth year]  Referred by: [name], NP Reason for referral : Chronic Care Management (history of CVA) and Care Coordination (PT/Medical Bill Assistance )   CYNDIA Solis is a 63 y.o. year old female who is a primary care patient of [name], [name], NP. The CCM team was consulted for assistance with chronic disease management and care coordination needs.    Review of patient status, including review of consultants reports, relevant laboratory and other test results, and collaboration with appropriate care team members and the patient's provider was performed as part of comprehensive patient evaluation and provision of chronic care management services.    SDOH (Social Determinants of Health) screening performed today: Financial Strain  Stress Physical Activity. See Care Plan for related entries.   Outpatient Encounter Medications as of 10/05/2018  Medication Sig Note  . albuterol (PROVENTIL HFA;VENTOLIN HFA) 108 (90 Base) MCG/ACT inhaler Inhale 2 puffs into the lungs every 6 (six) hours as needed for wheezing or shortness of breath. 09/30/2018: Q2-4 hours  . aspirin EC 81 MG EC tablet Take 1 tablet (81 mg total) by mouth daily.   Kristin Solis atorvastatin (LIPITOR) 40 MG tablet Take 1 tablet (40 mg total) by mouth daily at 6 PM.   . fluticasone (FLONASE) 50 MCG/ACT nasal spray Place 2 sprays into both nostrils daily.   . Fluticasone-Salmeterol (ADVAIR DISKUS) 250-50 MCG/DOSE AEPB Inhale 1 puff into the lungs 2 (two) times daily.   Kristin Solis lisinopril (PRINIVIL,ZESTRIL) 5 MG tablet Take 1 tablet (5 mg total) by mouth daily.   . ticagrelor (BRILINTA) 90 MG TABS tablet Take 1 tablet (90 mg total) by mouth 2 (two) times daily.    No facility-administered encounter medications on file as of 10/05/2018.      Goals Addressed            This Visit's Progress   . "I need help getting therapy" (pt-stated)        Current Barriers:  Kristin Solis Knowledge Deficits related to options for therapy post CVa . Film/video editor.   Nurse Case Manager Clinical Goal(s):  Kristin Solis Over the next 30 days, patient will verbalize understanding of plan for outpatient therapy options  Interventions:  . Evaluation of current treatment plan related to post CVA therapy needs and patient's adherence to plan as established by provider. Kristin Solis  [facility] PT clinic through [facility] PT student [phone]  . Patient heard from the [facility] and related to patient not having Wifi they are unsure if they will be able to to connect with her.  . Patient has received the patient assistance papers and requests assistance filling them out. . Plan to meet with patient Tuesday 8/25 @ 3:30 to assist with forms and do a test run with [facility] clinic personnel.  Kristin Solis EMR reveals patient will be receiving papers from Med Management clinic requested she bring those too. . Patient stating she went to [facility] medical supply and obtained a compression sleeve to assist with the swelling in her arm.  . Discussed plans with patient for ongoing care management follow up and provided patient with direct contact information for care management team.  Patient Self Care Activities:  . Currently UNABLE TO independently perform ADL's including self care activities such as bathing, dressing because of stroke deficits   Plan:  . RNCM will follow up with patient over the next 30 days to ensure initiation  of services or options provided by outpatient PT   Please see past updates related to this goal by clicking on the "Past Updates" button in the selected goal          The care management team will reach out to the patient again over the next 30 days.  The patient has been provided with contact information for the care management team and has been advised to call with any health related questions or concerns.   Ma RingsJanci Luvinia Lucy RN, BSN Nurse Case  Education officer, communityManager [facility]  [phone]) Business Mobile

## 2018-10-10 DIAGNOSIS — Z0271 Encounter for disability determination: Secondary | ICD-10-CM

## 2018-10-11 ENCOUNTER — Ambulatory Visit: Payer: Self-pay | Admitting: *Deleted

## 2018-10-11 ENCOUNTER — Other Ambulatory Visit: Payer: Self-pay

## 2018-10-11 ENCOUNTER — Other Ambulatory Visit: Payer: Self-pay | Admitting: Nurse Practitioner

## 2018-10-11 ENCOUNTER — Ambulatory Visit: Payer: Self-pay | Admitting: Pharmacist

## 2018-10-11 DIAGNOSIS — E785 Hyperlipidemia, unspecified: Secondary | ICD-10-CM

## 2018-10-11 DIAGNOSIS — Z8673 Personal history of transient ischemic attack (TIA), and cerebral infarction without residual deficits: Secondary | ICD-10-CM

## 2018-10-11 DIAGNOSIS — I639 Cerebral infarction, unspecified: Secondary | ICD-10-CM

## 2018-10-11 MED ORDER — FLUTICASONE-SALMETEROL 250-50 MCG/DOSE IN AEPB: 1.0000 | INHALATION_SPRAY | Freq: Two times a day (BID) | RESPIRATORY_TRACT | 3 refills | Status: DC

## 2018-10-11 MED ORDER — LISINOPRIL 5 MG PO TABS: 5.0000 mg | ORAL_TABLET | Freq: Every day | ORAL | 3 refills | Status: DC

## 2018-10-11 MED ORDER — ATORVASTATIN CALCIUM 40 MG PO TABS: 40.0000 mg | ORAL_TABLET | Freq: Every day | ORAL | 11 refills | Status: DC

## 2018-10-11 MED ORDER — FLUTICASONE PROPIONATE 50 MCG/ACT NA SUSP: 2.0000 | Freq: Every day | NASAL | 3 refills | Status: DC

## 2018-10-11 MED ORDER — ALBUTEROL SULFATE HFA 108 (90 BASE) MCG/ACT IN AERS: 2.0000 | INHALATION_SPRAY | Freq: Four times a day (QID) | RESPIRATORY_TRACT | 3 refills | Status: DC | PRN

## 2018-10-11 NOTE — Patient Instructions (Signed)
Visit Information  Goals Addressed            This Visit's Progress     Patient Stated   . "I can't afford my medications" (pt-stated)       Current Barriers:  . Financial Barriers: patient is uninsured and reports cost concerns with a few of her medications.  . Notes that she cannot afford Advair, albuterol, or Flonase. Has a small and unsustainable supply at home.  . Notes that it is difficult for her to afford the $19/month for atorvastatin, lisinopril, and aspirin.  . Notes that she is receiving Brilinta from patient assistance; her nephew works for [employer] and assisted her with this application previously.  [name] Confirms that she is uninsured (no Medicaid yet) and lives in [place] . Received applications for Medication Management Clinic  Pharmacist Clinical Goal(s):  [name] Over the next 30 days, patient will work with PharmD and providers to relieve medication access concerns  Interventions: . Due to post-stroke deficits in right hand, assisted patient in completed Medication Management Clinic application paperwork, along with Janci Minor, RN. . Will collaborate with Marnee Guarneri, NP to send all prescriptions (except Brilinta, patient receiving from patient assistance) to Medication Mgmt Clinic  Patient Self Care Activities:  . Patient will provide necessary portions of application   Please see past updates related to this goal by clicking on the "Past Updates" button in the selected goal         The patient verbalized understanding of instructions provided today and declined a print copy of patient instruction materials.   Plan:  - Will continue to provide collaborative support to patient with CCM team.  Courtney Heys, PharmD  Clinical Pharmacist  [place]  [phone]

## 2018-10-11 NOTE — Chronic Care Management (AMB) (Signed)
Chronic Care Management   Follow Up Note   10/11/2018 Name: Kristin Solis Brandenberger MRN: [id number] DOB: [birth year]  Referred by: Marjie Skiffannady, [name], NP Reason for referral : Chronic Care Management (CVA) and Care Coordination (PT/Assist with Medical Forms)   Kristin Solis Nez is a 63 y.o. year old female who is a primary care patient of [name], Dorie RankJolene T, NP. The CCM team was consulted for assistance with chronic disease management and care coordination needs.    Review of patient status, including review of consultants reports, relevant laboratory and other test results, and collaboration with appropriate care team members and the patient's provider was performed as part of comprehensive patient evaluation and provision of chronic care management services.    SDOH (Social Determinants of Health) screening performed today: Financial Strain  Physical Activity. See Care Plan for related entries.   Outpatient Encounter Medications as of 10/11/2018  Medication Sig Note  . aspirin EC 81 MG EC tablet Take 1 tablet (81 mg total) by mouth daily.   . ticagrelor (BRILINTA) 90 MG TABS tablet Take 1 tablet (90 mg total) by mouth 2 (two) times daily.   . [DISCONTINUED] albuterol (PROVENTIL HFA;VENTOLIN HFA) 108 (90 Base) MCG/ACT inhaler Inhale 2 puffs into the lungs every 6 (six) hours as needed for wheezing or shortness of breath. 09/30/2018: Q2-4 hours  . [DISCONTINUED] atorvastatin (LIPITOR) 40 MG tablet Take 1 tablet (40 mg total) by mouth daily at 6 PM.   . [DISCONTINUED] fluticasone (FLONASE) 50 MCG/ACT nasal spray Place 2 sprays into both nostrils daily.   . [DISCONTINUED] Fluticasone-Salmeterol (ADVAIR DISKUS) 250-50 MCG/DOSE AEPB Inhale 1 puff into the lungs 2 (two) times daily.   . [DISCONTINUED] lisinopril (PRINIVIL,ZESTRIL) 5 MG tablet Take 1 tablet (5 mg total) by mouth daily.    No facility-administered encounter medications on file as of 10/11/2018.      Goals Addressed            This  Visit's Progress   . "I need help getting therapy" (pt-stated)       Current Barriers:  [name]. Knowledge Deficits related to options for therapy post CVa . Corporate treasurerinancial Constraints.   Nurse Case Manager Clinical Goal(s):  [name]. Over the next 30 days, patient will verbalize understanding of plan for outpatient therapy options  Interventions:  . Evaluation of current treatment plan related to post CVA therapy needs and patient's adherence to plan as established by provider. [name].  [facility] [employer] PT student [phone]- [phone] . Collaborated with [facility] PT student Bernadette HoitKrista Nunn while patient in my office to establish patient had Webex capabilities. Patient able to connect with Dot LanesKrista at first through my computer then establish a connection through her cell phone. Dot LanesKrista gave patient all the information. Patient is currently on the waiting list but [facility] plans to [facility] patient as soon as a slot is available. Patient very thankful for this resource. . Wrote all the details down for [facility] for the patient  . Assisted patient with filling out all medical assistance forms to be able to obtain financial assistance for medical bills. . Patient needs one part of the information notarized, so collaborated with notary(Donna) who works in the pharmacy next door to MD office to have patient come back tomorrow with paperwork and she would provide this service.  . Empathetic listening provided as patient discussed the recent loss of function she is dealing with after her CVA. Patient tearful relaying her spouse is now  having to do more things for her. Discussed with patient things she can do to naturally to elevate her mood like exercise, visiting a friend(she mentioned her sister) and getting outside in the sunshine. Plan to continue to explore with patient how she is feeling. Patient denied any suicidal ideation or depression.  . Discussed plans with patient for ongoing care  management follow up and provided patient with direct contact information for care management team.  Patient Self Care Activities:  . Currently UNABLE TO independently perform ADL's including self care activities such as bathing, dressing because of stroke deficits   Plan:  . RNCM will follow up with patient over the next 30 days to ensure initiation of services or options provided by outpatient PT   Please see past updates related to this goal by clicking on the "Past Updates" button in the selected goal          The care management team will reach out to the patient again over the next 30 days.  The patient has been provided with contact information for the care management team and has been advised to call with any health related questions or concerns.    [name] Braxton Vantrease RN, BSN Nurse Case Editor, [facility]  [phone]) Business Mobile

## 2018-10-11 NOTE — Chronic Care Management (AMB) (Signed)
Chronic Care Management   Follow Up Note   10/11/2018 Name: Kristin Solis MRN: [id number] DOB: 11-18-1955  Referred by: [name], NP Reason for referral : Chronic Care Management (Medication Management Clinic)   Kristin Solis is a 63 y.o. year old female who is a primary care patient of [name], [name], NP. The CCM team was consulted for assistance with chronic disease management and care coordination needs.    Met with patient face to face with Marnee Guarneri, NP today.  Review of patient status, including review of consultants reports, relevant laboratory and other test results, and collaboration with appropriate care team members and the patient's provider was performed as part of comprehensive patient evaluation and provision of chronic care management services.    SDOH (Social Determinants of Health) screening performed today: Financial Strain . See Care Plan for related entries.   Outpatient Encounter Medications as of 10/11/2018  Medication Sig Note  . albuterol (PROVENTIL HFA;VENTOLIN HFA) 108 (90 Base) MCG/ACT inhaler Inhale 2 puffs into the lungs every 6 (six) hours as needed for wheezing or shortness of breath. 09/30/2018: Q2-4 hours  . aspirin EC 81 MG EC tablet Take 1 tablet (81 mg total) by mouth daily.   [name] atorvastatin (LIPITOR) 40 MG tablet Take 1 tablet (40 mg total) by mouth daily at 6 PM.   . fluticasone (FLONASE) 50 MCG/ACT nasal spray Place 2 sprays into both nostrils daily.   . Fluticasone-Salmeterol (ADVAIR DISKUS) 250-50 MCG/DOSE AEPB Inhale 1 puff into the lungs 2 (two) times daily.   [name] lisinopril (PRINIVIL,ZESTRIL) 5 MG tablet Take 1 tablet (5 mg total) by mouth daily.   . ticagrelor (BRILINTA) 90 MG TABS tablet Take 1 tablet (90 mg total) by mouth 2 (two) times daily.    No facility-administered encounter medications on file as of 10/11/2018.      Goals Addressed            This Visit's Progress     Patient Stated   . "I can't afford my  medications" (pt-stated)       Current Barriers:  . Financial Barriers: patient is uninsured and reports cost concerns with a few of her medications.  . Notes that she cannot afford Advair, albuterol, or Flonase. Has a small and unsustainable supply at home.  . Notes that it is difficult for her to afford the $19/month for atorvastatin, lisinopril, and aspirin.  . Notes that she is receiving Brilinta from patient assistance; her nephew works for [employer] and assisted her with this application previously.  [name] Confirms that she is uninsured (no Medicaid yet) and lives in [place] . Received applications for [facility]  Pharmacist Clinical Goal(s):  [name] Over the next 30 days, patient will work with PharmD and providers to relieve medication access concerns  Interventions: . Due to post-stroke deficits in right hand, assisted patient in completed [facility] application paperwork, along with Janci Minor, RN. . Will collaborate with Marnee Guarneri, NP to send all prescriptions (except Brilinta, patient receiving from patient assistance) to [facility]  Patient Self Care Activities:  . Patient will provide necessary portions of application   Please see past updates related to this goal by clicking on the "Past Updates" button in the selected goal         Plan:  - Will continue to provide collaborative support to patient with CCM team.   Courtney Heys, PharmD Clinical Pharmacist [place] [phone]

## 2018-10-12 ENCOUNTER — Ambulatory Visit: Payer: Self-pay | Admitting: *Deleted

## 2018-10-12 ENCOUNTER — Ambulatory Visit: Payer: Self-pay | Admitting: Pharmacist

## 2018-10-12 DIAGNOSIS — Z8673 Personal history of transient ischemic attack (TIA), and cerebral infarction without residual deficits: Secondary | ICD-10-CM

## 2018-10-12 DIAGNOSIS — I639 Cerebral infarction, unspecified: Secondary | ICD-10-CM

## 2018-10-12 NOTE — Chronic Care Management (AMB) (Signed)
  Chronic Care Management   Follow Up Note   10/12/2018 Name: Kristin Solis MRN: [id number] DOB: [birth year]  Referred by: [name], NP Reason for referral : Chronic Care Management (Medication Management)   Kristin Solis is a 63 y.o. year old female who is a primary care patient of [name], [name], NP. The CCM team was consulted for assistance with chronic disease management and care coordination needs.    Met with patient face to face today.  Review of patient status, including review of consultants reports, relevant laboratory and other test results, and collaboration with appropriate care team members and the patient's provider was performed as part of comprehensive patient evaluation and provision of chronic care management services.    SDOH (Social Determinants of Health) screening performed today: Financial Strain . See Care Plan for related entries.   Outpatient Encounter Medications as of 10/12/2018  Medication Sig  . albuterol (VENTOLIN HFA) 108 (90 Base) MCG/ACT inhaler Inhale 2 puffs into the lungs every 6 (six) hours as needed for wheezing or shortness of breath.  [name] aspirin EC 81 MG EC tablet Take 1 tablet (81 mg total) by mouth daily.  [name] atorvastatin (LIPITOR) 40 MG tablet Take 1 tablet (40 mg total) by mouth daily at 6 PM.  . fluticasone (FLONASE) 50 MCG/ACT nasal spray Place 2 sprays into both nostrils daily.  . Fluticasone-Salmeterol (ADVAIR DISKUS) 250-50 MCG/DOSE AEPB Inhale 1 puff into the lungs 2 (two) times daily.  [name] lisinopril (ZESTRIL) 5 MG tablet Take 1 tablet (5 mg total) by mouth daily.  . ticagrelor (BRILINTA) 90 MG TABS tablet Take 1 tablet (90 mg total) by mouth 2 (two) times daily.   No facility-administered encounter medications on file as of 10/12/2018.      Goals Addressed            This Visit's Progress     Patient Stated   . "I can't afford my medications" (pt-stated)       Current Barriers:  . Financial Barriers: patient is  uninsured and reports cost concerns with a few of her medications.  . Notes that she cannot afford Advair, albuterol, or Flonase. Has a small and unsustainable supply at home.  . Notes that it is difficult for her to afford the $19/month for atorvastatin, lisinopril, and aspirin.  . Notes that she is receiving Brilinta from patient assistance; her nephew works for [employer] and assisted her with this application previously.  [name] Confirms that she is uninsured (no Medicaid yet) and lives in [place] . Received applications for [facility]  Pharmacist Clinical Goal(s):  [name] Over the next 30 days, patient will work with PharmD and providers to relieve medication access concerns  Interventions: . Assisted patient in getting forms notarized and completed. Mailed to [facility] today. Will alert Langley Adie that the applications will be coming their way.   Patient Self Care Activities:  . Patient will provide necessary portions of application   Please see past updates related to this goal by clicking on the "Past Updates" button in the selected goal          Plan:  - Will continue to support patient with CCM team  Catie Darnelle Maffucci, PharmD Clinical Pharmacist [place] [phone]

## 2018-10-12 NOTE — Chronic Care Management (AMB) (Signed)
Chronic Care Management   Follow Up Note   10/12/2018 Name: Kristin Solis MRN: [id number] DOB: 1955-07-05  Referred by: [name], NP Reason for referral : Chronic Care Management (Hx CVA ) and Care Coordination (Notariziation and Mailing of forms )   Kristin Solis is a 63 y.o. year old female who is a primary care patient of [name], [name], NP. The CCM team was consulted for assistance with chronic disease management and care coordination needs.    Review of patient status, including review of consultants reports, relevant laboratory and other test results, and collaboration with appropriate care team members and the patient's provider was performed as part of comprehensive patient evaluation and provision of chronic care management services.    SDOH (Social Determinants of Health) screening performed today: Financial Strain  Physical Activity. See Care Plan for related entries.   Outpatient Encounter Medications as of 10/12/2018  Medication Sig  . albuterol (VENTOLIN HFA) 108 (90 Base) MCG/ACT inhaler Inhale 2 puffs into the lungs every 6 (six) hours as needed for wheezing or shortness of breath.  [name] aspirin EC 81 MG EC tablet Take 1 tablet (81 mg total) by mouth daily.  [name] atorvastatin (LIPITOR) 40 MG tablet Take 1 tablet (40 mg total) by mouth daily at 6 PM.  . fluticasone (FLONASE) 50 MCG/ACT nasal spray Place 2 sprays into both nostrils daily.  . Fluticasone-Salmeterol (ADVAIR DISKUS) 250-50 MCG/DOSE AEPB Inhale 1 puff into the lungs 2 (two) times daily.  [name] lisinopril (ZESTRIL) 5 MG tablet Take 1 tablet (5 mg total) by mouth daily.  . ticagrelor (BRILINTA) 90 MG TABS tablet Take 1 tablet (90 mg total) by mouth 2 (two) times daily.   No facility-administered encounter medications on file as of 10/12/2018.      Goals Addressed            This Visit's Progress   . "I need help getting therapy" (pt-stated)       Current Barriers:  [name] Knowledge Deficits related to  options for therapy post CVa . Film/video editor.   Nurse Case Manager Clinical Goal(s):  [name] Over the next 30 days, patient will verbalize understanding of plan for outpatient therapy options  Interventions:  . Evaluation of current treatment plan related to post CVA therapy needs and patient's adherence to plan as established by provider. [name]  [facility] PT clinic through [facility] PT student [phone] . Collaborated with [facility] PT student Olena Leatherwood while patient in my office to establish patient had Webex capabilities. Patient able to connect with [name] at first through my computer then establish a connection through her cell phone. [name] gave patient all the information. Patient is currently on the waiting list but [facility] plans to see patient as soon as a slot is available. Patient very thankful for this resource. . Wrote all the details down for upcoming [facility] clinic webex for the patient  . Assisted patient with filling out all medical assistance forms to be able to obtain financial assistance for medical bills. . Patient needs one part of the information notarized, so collaborated with notary(Donna) who works in the pharmacy next door to MD office to have patient come back tomorrow with paperwork and she would provide this service.  . Empathetic listening provided as patient discussed the recent loss of function she is dealing with after her CVA. Patient tearful relaying her spouse is now having to do more things for her. Discussed with patient things she  can do to naturally to elevate her mood like exercise, visiting a friend(she mentioned her sister) and getting outside in the sunshine. Plan to continue to explore with patient how she is feeling. Patient denied any suicidal ideation or depression.  . Discussed plans with patient for ongoing care management follow up and provided patient with direct contact information for care management team. . Patient came in  this am and I assisted her in getting her financial support letter notarized  . Mailed paper work for Patient assistance for medical bills . Explained to patient the process and to be expecting a call from medication management clinic and patient financial assistance personnel. . Patient requested ensure samples/given    Patient Self Care Activities:  . Currently UNABLE TO independently perform ADL's including self care activities such as bathing, dressing because of stroke deficits   Plan:  . RNCM will follow up with patient over the next 30 days to ensure initiation of services or options provided by outpatient PT   Please see past updates related to this goal by clicking on the "Past Updates" button in the selected goal          The care management team will reach out to the patient again over the next 30 days.  The patient has been provided with contact information for the care management team and has been advised to call with any health related questions or concerns.    [name] RN, BSN Nurse Case Education officer, communityManager [facility]  [phone]) Business Mobile

## 2018-10-12 NOTE — Patient Instructions (Signed)
Visit Information  Goals Addressed            This Visit's Progress     Patient Stated   . "I can't afford my medications" (pt-stated)       Current Barriers:  . Financial Barriers: patient is uninsured and reports cost concerns with a few of her medications.  . Notes that she cannot afford Advair, albuterol, or Flonase. Has a small and unsustainable supply at home.  . Notes that it is difficult for her to afford the $19/month for atorvastatin, lisinopril, and aspirin.  . Notes that she is receiving Brilinta from patient assistance; her nephew works for [employer] and assisted her with this application previously.  [name] Confirms that she is uninsured (no Medicaid yet) and lives in [place] . Received applications for [facility]  Pharmacist Clinical Goal(s):  [name] Over the next 30 days, patient will work with PharmD and providers to relieve medication access concerns  Interventions: . Assisted patient in getting forms notarized and completed. Mailed to [facility] today. Will alert Langley Adie that the applications will be coming their way.   Patient Self Care Activities:  . Patient will provide necessary portions of application   Please see past updates related to this goal by clicking on the "Past Updates" button in the selected goal         The patient verbalized understanding of instructions provided today and declined a print copy of patient instruction materials.    Plan:  - Will continue to support patient with CCM team  Catie Darnelle Maffucci, PharmD Clinical Pharmacist [place] [phone]

## 2018-10-13 ENCOUNTER — Ambulatory Visit: Payer: Medicaid Other | Admitting: Neurology

## 2018-10-14 ENCOUNTER — Telehealth: Payer: Self-pay

## 2018-10-19 ENCOUNTER — Ambulatory Visit: Payer: Self-pay | Admitting: Licensed Clinical Social Worker

## 2018-10-19 ENCOUNTER — Ambulatory Visit: Payer: No Typology Code available for payment source | Admitting: Pharmacy Technician

## 2018-10-19 ENCOUNTER — Other Ambulatory Visit: Payer: Self-pay

## 2018-10-19 DIAGNOSIS — Z79899 Other long term (current) drug therapy: Secondary | ICD-10-CM

## 2018-10-19 DIAGNOSIS — Z8673 Personal history of transient ischemic attack (TIA), and cerebral infarction without residual deficits: Secondary | ICD-10-CM

## 2018-10-19 DIAGNOSIS — I6602 Occlusion and stenosis of left middle cerebral artery: Secondary | ICD-10-CM

## 2018-10-19 NOTE — Progress Notes (Signed)
Met with patient completed financial assistance application for [facility] due to recent hospital visit.  Patient agreed to be responsible for gathering financial information and forwarding to appropriate department in [facility].    Completed [facility] application and contract.  Patient agreed to all terms of the [facility] contract.    Patient approved to receive medication assistance at [facility] as long as eligibility criteria continues to be met.    Provided patient with community resource material based on her particular needs.    [facility]

## 2018-10-19 NOTE — Chronic Care Management (AMB) (Signed)
Chronic Care Management    Clinical Social Work Follow Up Note  10/19/2018 Name: Kristin AblerBeverly J Vey MRN: [id number] DOB: 1955-11-07  Kristin Solis is a 63 y.o. year old female who is a primary care patient of [name], Dorie RankJolene T, NP. The CCM team was consulted for assistance with Financial Difficulties related to health care.   Review of patient status, including review of consultants reports, other relevant assessments, and collaboration with appropriate care team members and the patient's provider was performed as part of comprehensive patient evaluation and provision of chronic care management services.    SDOH (Social Determinants of Health) screening performed today: Financial Strain . See Care Plan for related entries.   Outpatient Encounter Medications as of 10/19/2018  Medication Sig  . albuterol (VENTOLIN HFA) 108 (90 Base) MCG/ACT inhaler Inhale 2 puffs into the lungs every 6 (six) hours as needed for wheezing or shortness of breath.  [name]. aspirin EC 81 MG EC tablet Take 1 tablet (81 mg total) by mouth daily.  [name]. atorvastatin (LIPITOR) 40 MG tablet Take 1 tablet (40 mg total) by mouth daily at 6 PM.  . fluticasone (FLONASE) 50 MCG/ACT nasal spray Place 2 sprays into both nostrils daily.  . Fluticasone-Salmeterol (ADVAIR DISKUS) 250-50 MCG/DOSE AEPB Inhale 1 puff into the lungs 2 (two) times daily.  [name]. lisinopril (ZESTRIL) 5 MG tablet Take 1 tablet (5 mg total) by mouth daily.  . ticagrelor (BRILINTA) 90 MG TABS tablet Take 1 tablet (90 mg total) by mouth 2 (two) times daily.   No facility-administered encounter medications on file as of 10/19/2018.      Goals Addressed    . "I need to to gain Medicaid and cope better with daily stressors" (pt-stated)       Current Barriers:  . Financial constraints . ADL IADL limitations . Mental Health Concerns  . Limited education about Medicaid enrollment process* . Memory Deficits . Inability to perform ADL's independently . Inability to perform  IADL's independently  Clinical Social Work Clinical Goal(s):  [name]. Over the next 90 days, client will work with SW to address concerns related to gaining financial assistance education and getting enrolled in social security.  . Over the next 90 days, client will follow up with DSS Medicaid caseworker* as directed by SW  Interventions: . Patient interviewed and appropriate assessments performed . Provided mental health counseling with regard to patient was emotional throughout phone call due to her daily difficulty with mobility/weakness/balance. LCSW provided reflective listening and implemented appropriate interventions to help suppport patient and her emotional needs  . Patient reports that she was only approved for Medicaid [facility] but that she would be able to get Full Adult Medicaid if she were deemed disabled.  . CSW used motivational interviewing techniques to engage pt in meaningful conversation and to assess pt's wilingess to implement appropriate self care daily to combat stressors/anxiety. CSW explored pt's coping skills and access to community services.  . Patient confirms completing need paperwork for Financial Assistance Program with [facility] and Medication Assistance Application. Patient reports having a $79,000 hospital bill. . Patient reports ongoing active communication between her and her Medicaid caseworker. Caseworker has not received notice on disability decision yet.  . Patient reports having an appointment at the social security office on 09/23/2018. [name]. Discussed plans with patient for ongoing care management follow up and provided patient with direct contact information for care management team . Advised patient to keep in contact with DSS to follow up  on Medicaid. Patient confirms receiving Food Stamps ($200 per month) . Assisted patient/caregiver with obtaining information about health plan benefits  Patient Self Care Activities:  . Attends all scheduled  provider appointments . Calls provider office for new concerns or questions  Please see past updates related to this goal by clicking on the "Past Updates" button in the selected goal      Follow Up Plan: SW will follow up with patient by phone over the next 60 days  [name], [place], MSW, [email or web address] Phone: [phone]

## 2018-11-11 ENCOUNTER — Ambulatory Visit: Payer: Self-pay | Admitting: *Deleted

## 2018-11-11 DIAGNOSIS — I6602 Occlusion and stenosis of left middle cerebral artery: Secondary | ICD-10-CM

## 2018-11-13 NOTE — Patient Instructions (Signed)
Thank you allowing the Chronic Care Management Team to be a part of your care! It was a pleasure speaking with you today!   CCM (Chronic Care Management) Team   Montreal Steidle RN, BSN Nurse Care Coordinator  [phone]  [facility] PharmD  Clinical Pharmacist  [phone]  [name] LCSW Clinical Social Worker [phone]  Goals Addressed            This Visit's Progress   . RN-"I need help getting therapy" (pt-stated)       Current Barriers:  [name] Knowledge Deficits related to options for therapy post CVa . Film/video editor.   Nurse Case Manager Clinical Goal(s):  [name] Over the next 30 days, patient will verbalize understanding of plan for outpatient therapy options  Interventions:  . Received a call from patient stating she is concerned because she has not received her New Medicaid Card and she has a very important Neuro-Surgery appointment on Oct 7.  . Patient stated she received a letter stating she was approved for full medicaid and SSDI.  [name] 3 person call made to [facility] office with patient, spoke to Butch Penny who confirmed patient now has full medicaid and will re-send card. Confirmed address with patient.  . Patient requests new referral for out patient PT using Medicaid/SSDI benefits.  . Assisted patient with finding out if she was now eligible for Medicare-Pt is only 72 and has not been on SSDI for 2 years she is not eligible at this time.  . Patient requests I assist her by calling the hospital [facility] and make sure they received her paper work.      Patient Self Care Activities:  . Currently UNABLE TO independently perform ADL's including self care activities such as bathing, dressing because of stroke deficits   Plan:  . RNCM will follow up with patient over the next 30 days to ensure initiation of services or options provided by outpatient PT   Please see past updates related to this goal by clicking on the "Past Updates" button in the selected  goal         The patient verbalized understanding of instructions provided today and declined a print copy of patient instruction materials.   The patient has been provided with contact information for the care management team and has been advised to call with any health related questions or concerns.

## 2018-11-13 NOTE — Chronic Care Management (AMB) (Signed)
Chronic Care Management   Follow Up Note   11/11/2018 Name: Kristin Solis MRN: [id number] DOB: August 22, 1955  Referred by: [name], NP Reason for referral : Chronic Care Management (CVA) and Care Coordination (Medicaid card )   Kristin Solis is a 63 y.o. year old female who is a primary care patient of [name], [name], NP. The CCM team was consulted for assistance with chronic disease management and care coordination needs.    Review of patient status, including review of consultants reports, relevant laboratory and other test results, and collaboration with appropriate care team members and the patient's provider was performed as part of comprehensive patient evaluation and provision of chronic care management services.    SDOH (Social Determinants of Health) screening performed today: Financial Strain  Physical Activity. See Care Plan for related entries.   Advanced Directives Status: N See Care Plan and Vynca application for related entries.  Outpatient Encounter Medications as of 11/11/2018  Medication Sig  . albuterol (VENTOLIN HFA) 108 (90 Base) MCG/ACT inhaler Inhale 2 puffs into the lungs every 6 (six) hours as needed for wheezing or shortness of breath.  [name] aspirin EC 81 MG EC tablet Take 1 tablet (81 mg total) by mouth daily.  [name] atorvastatin (LIPITOR) 40 MG tablet Take 1 tablet (40 mg total) by mouth daily at 6 PM.  . fluticasone (FLONASE) 50 MCG/ACT nasal spray Place 2 sprays into both nostrils daily.  . Fluticasone-Salmeterol (ADVAIR DISKUS) 250-50 MCG/DOSE AEPB Inhale 1 puff into the lungs 2 (two) times daily.  [name] lisinopril (ZESTRIL) 5 MG tablet Take 1 tablet (5 mg total) by mouth daily.  . ticagrelor (BRILINTA) 90 MG TABS tablet Take 1 tablet (90 mg total) by mouth 2 (two) times daily.   No facility-administered encounter medications on file as of 11/11/2018.      Goals Addressed            This Visit's Progress   . RN-"I need help getting therapy"  (pt-stated)       Current Barriers:  [name] Knowledge Deficits related to options for therapy post CVa . Film/video editor.   Nurse Case Manager Clinical Goal(s):  [name] Over the next 30 days, patient will verbalize understanding of plan for outpatient therapy options  Interventions:  . Received a call from patient stating she is concerned because she has not received her New Medicaid Card and she has a very important Neuro-Surgery appointment on Oct 7.  . Patient stated she received a letter stating she was approved for full medicaid and SSDI.  [name] 3 person call made to [facility] office with patient, spoke to Butch Penny who confirmed patient now has full medicaid and will re-send card. Confirmed address with patient.  . Patient requests new referral for out patient PT using Medicaid/SSDI benefits.  . Assisted patient with finding out if she was now eligible for Medicare-Pt is only 17 and has not been on SSDI for 2 years she is not eligible at this time.  . Patient requests I assist her by calling the hospital [facility] and make sure they received her paper work.      Patient Self Care Activities:  . Currently UNABLE TO independently perform ADL's including self care activities such as bathing, dressing because of stroke deficits   Plan:  . RNCM will follow up with patient over the next 30 days to ensure initiation of services or options provided by outpatient PT   Please see past updates related to  this goal by clicking on the "Past Updates" button in the selected goal          The care management team will reach out to the patient again over the next 30 days.  The patient has been provided with contact information for the care management team and has been advised to call with any health related questions or concerns.   Ma Rings Crisanto Nied RN, BSN Nurse Case Education officer, [facility]  [phone]) Business Mobile

## 2018-11-14 ENCOUNTER — Other Ambulatory Visit: Payer: Self-pay | Admitting: Nurse Practitioner

## 2018-11-14 ENCOUNTER — Telehealth: Payer: Self-pay | Admitting: Pharmacist

## 2018-11-14 DIAGNOSIS — Z8673 Personal history of transient ischemic attack (TIA), and cerebral infarction without residual deficits: Secondary | ICD-10-CM

## 2018-11-14 NOTE — Progress Notes (Signed)
PT order

## 2018-11-14 NOTE — Telephone Encounter (Signed)
11/14/2018 9:02:26 AM - Advair & Ventolin to [place]  1/59/4707 Faxed [employer] application for enrollment for Advair 250/50 Inhale 1 puff into the lungs 2 times a day & Ventolin HFA 63mcg Inhale 2 puffs into the lungs every 6 hours as needed for wheezing or shortness of breath.Delos Haring

## 2018-11-16 ENCOUNTER — Telehealth: Payer: Self-pay

## 2018-11-23 ENCOUNTER — Ambulatory Visit: Payer: Medicaid Other | Admitting: Neurology

## 2018-11-23 ENCOUNTER — Other Ambulatory Visit: Payer: Self-pay

## 2018-11-23 ENCOUNTER — Encounter: Payer: Self-pay | Admitting: Neurology

## 2018-11-23 VITALS — BP 137/77 | HR 79 | Temp 97.5°F | Ht 63.0 in | Wt 215.4 lb

## 2018-11-23 DIAGNOSIS — G8110 Spastic hemiplegia affecting unspecified side: Secondary | ICD-10-CM | POA: Diagnosis not present

## 2018-11-23 NOTE — Patient Instructions (Signed)
I had a long d/w patient and her husband about her recent stroke, risk for recurrent stroke/TIAs, personally independently reviewed imaging studies and stroke evaluation results and answered questions.Continue aspirin 81 mg daily  but stop brillinta as it has been more than 6 months since her left carotid stent for secondary stroke prevention and maintain strict control of hypertension with blood pressure goal below 130/90, diabetes with hemoglobin A1c goal below 6.5% and lipids with LDL cholesterol goal below 70 mg/dL. I also advised the patient to eat a healthy diet with plenty of whole grains, cereals, fruits and vegetables, exercise regularly and maintain ideal body weight. Continue outpatient PT/OT. Consider participation in TRANSPORT 2 stroke trial at [facility] if interested.  Followup in the future with my nurse practitioner Shanda Bumps in 6 months or call earlier if necessary.  Stroke Prevention Some medical conditions and behaviors are associated with a higher chance of having a stroke. You can help prevent a stroke by making nutrition, lifestyle, and other changes, including managing any medical conditions you may have. What nutrition changes can be made?   Eat healthy foods. You can do this by: ? Choosing foods high in fiber, such as fresh fruits and vegetables and whole grains. ? Eating at least 5 or more servings of fruits and vegetables a day. Try to fill half of your plate at each meal with fruits and vegetables. ? Choosing lean protein foods, such as lean cuts of meat, poultry without skin, fish, tofu, beans, and nuts. ? Eating low-fat dairy products. ? Avoiding foods that are high in salt (sodium). This can help lower blood pressure. ? Avoiding foods that have saturated fat, trans fat, and cholesterol. This can help prevent high cholesterol. ? Avoiding processed and premade foods.  Follow your health care provider's specific guidelines for losing weight, controlling high blood pressure  (hypertension), lowering high cholesterol, and managing diabetes. These may include: ? Reducing your daily calorie intake. ? Limiting your daily sodium intake to 1,500 milligrams (mg). ? Using only healthy fats for cooking, such as olive oil, canola oil, or sunflower oil. ? Counting your daily carbohydrate intake. What lifestyle changes can be made?  Maintain a healthy weight. Talk to your health care provider about your ideal weight.  Get at least 30 minutes of moderate physical activity at least 5 days a week. Moderate activity includes brisk walking, biking, and swimming.  Do not use any products that contain nicotine or tobacco, such as cigarettes and e-cigarettes. If you need help quitting, ask your health care provider. It may also be helpful to avoid exposure to secondhand smoke.  Limit alcohol intake to no more than 1 drink a day for nonpregnant women and 2 drinks a day for men. One drink equals 12 oz of beer, 5 oz of wine, or 1 oz of hard liquor.  Stop any illegal drug use.  Avoid taking birth control pills. Talk to your health care provider about the risks of taking birth control pills if: ? You are over 18 years old. ? You smoke. ? You get migraines. ? You have ever had a blood clot. What other changes can be made?  Manage your cholesterol levels. ? Eating a healthy diet is important for preventing high cholesterol. If cholesterol cannot be managed through diet alone, you may also need to take medicines. ? Take any prescribed medicines to control your cholesterol as told by your health care provider.  Manage your diabetes. ? Eating a healthy diet and exercising regularly are  important parts of managing your blood sugar. If your blood sugar cannot be managed through diet and exercise, you may need to take medicines. ? Take any prescribed medicines to control your diabetes as told by your health care provider.  Control your hypertension. ? To reduce your risk of stroke, try  to keep your blood pressure below 130/80. ? Eating a healthy diet and exercising regularly are an important part of controlling your blood pressure. If your blood pressure cannot be managed through diet and exercise, you may need to take medicines. ? Take any prescribed medicines to control hypertension as told by your health care provider. ? Ask your health care provider if you should monitor your blood pressure at home. ? Have your blood pressure checked every year, even if your blood pressure is normal. Blood pressure increases with age and some medical conditions.  Get evaluated for sleep disorders (sleep apnea). Talk to your health care provider about getting a sleep evaluation if you snore a lot or have excessive sleepiness.  Take over-the-counter and prescription medicines only as told by your health care provider. Aspirin or blood thinners (antiplatelets or anticoagulants) may be recommended to reduce your risk of forming blood clots that can lead to stroke.  Make sure that any other medical conditions you have, such as atrial fibrillation or atherosclerosis, are managed. What are the warning signs of a stroke? The warning signs of a stroke can be easily remembered as BEFAST.  B is for balance. Signs include: ? Dizziness. ? Loss of balance or coordination. ? Sudden trouble walking.  E is for eyes. Signs include: ? A sudden change in vision. ? Trouble seeing.  F is for face. Signs include: ? Sudden weakness or numbness of the face. ? The face or eyelid drooping to one side.  A is for arms. Signs include: ? Sudden weakness or numbness of the arm, usually on one side of the body.  S is for speech. Signs include: ? Trouble speaking (aphasia). ? Trouble understanding.  T is for time. ? These symptoms may represent a serious problem that is an emergency. Do not wait to see if the symptoms will go away. Get medical help right away. Call your local emergency services (911 in the  [place] Do not drive yourself to the hospital.  Other signs of stroke may include: ? A sudden, severe headache with no known cause. ? Nausea or vomiting. ? Seizure. Where to find more information For more information, visit:  American Stroke Association: www.strokeassociation.org  National Stroke Association: www.stroke.org Summary  You can prevent a stroke by eating healthy, exercising, not smoking, limiting alcohol intake, and managing any medical conditions you may have.  Do not use any products that contain nicotine or tobacco, such as cigarettes and e-cigarettes. If you need help quitting, ask your health care provider. It may also be helpful to avoid exposure to secondhand smoke.  Remember BEFAST for warning signs of stroke. Get help right away if you or a loved one has any of these signs. This information is not intended to replace advice given to you by your health care provider. Make sure you discuss any questions you have with your health care provider. Document Released: 03/12/2004 Document Revised: 01/15/2017 Document Reviewed: 03/10/2016 Elsevier Patient Education  2020 [employer].

## 2018-11-23 NOTE — Progress Notes (Signed)
[facility] [address] [place]. [place] [address] [phone]       OFFICE FOLLOW-UP NOTE  [name] Date of Birth:  05-08-1955 Medical Record Number:  [id number]   HPI: Initial virtual video visit 06/06/2018 ; Kristin Solis was initially scheduled today for in office hospital follow-up regarding left MCA infarct s/p TPA and IR with TICI 3 reperfusion and left ICA rescue stent with infarct likely due to large vessel disease extracranial carotid stenosis on 04/17/2018 but due to COVID-19 safety precautions, visit transition to telemedicine via WebEx. History obtained from patient and chart review. Reviewed all radiology images and labs personally.  [name] a 63 y.o.femalewith history of asthma and HTN who presented to [facility] with dysphasia and R HP which occurred while talking about a traumatic event. Symptoms completely resolved prior to ED. While in the ED, she began having difficulty speaking again along with dense right hemiparesis. She receivedtPA 04/17/2018 at 2032.CTA showed a dense LM1 occlusion with large ischemic penumbra on perfusion and CTA neck showed a left ICA embolus. She was transferred to [facility] and sent to IR for mechanical thrombectomy where she had TICI 3 reperfusion using ambo trap and stent assisted angioplasty of severely stenotic L ICA at the bulb.  Post CT head negative for ICH, mass-effect or midline shift.  MRI brain reviewed and showed multifocal acute left MCA territory infarcts and anterior/posterior border zone infarcts with scattered petechial hemorrhage secondary to large vessel disease.  2D echo unremarkable without cardiac source of embolism identified.  Initiated aspirin 81 mg and Brilinta post stent placement and secondary stroke prevention.  Initially found to be in hypertensive emergency and treated with Cleviprex which stabilized throughout admission and recommended long-term BP goal normotensive range.  LDL 102  and initiated atorvastatin 40 mg daily.  Diagnosed with OSA during inpatient and CPAP device supplied at discharge.  Other stroke risk factors include obesity, prior stroke per imaging and family history of stroke.  She had residual deficits of right hemiparesis but no residual speech difficulties and discharged home in stable condition with recommendations of home health PT/OT.             She continues to have residual deficits of right hemiparesis but does endorse improvement and endorses resolution of speech difficulty.  She did not receive any home health therapies as per patient, her insurance did not cover this.  She has been doing exercises on her own at home.  She has been able to continue ADLs independently but has required assistance with IADLs.  She endorses difficulty writing as she is right-hand dominant.  She is able to ambulate without assistive device within her home but will use a cane or rolling walker for long distance.  During conversation of ongoing deficits and difficulties, patient becomes tearful.  Discussion regarding potential post stroke depression with PHQ 2 score 0.  She denies underlying history of depression or anxiety.  She continues to be motivated for ongoing improvement but becomes upset when she attempts to try to do something with her right side and she is unable to or she speaks about it.  She continues on aspirin and Brilinta without side effects of bleeding or bruising.  Continues on atorvastatin without side effects of myalgias.  Blood pressure monitored at home and typically ranges 120s/70s.  She continues to be compliant with CPAP for OSA management.  She has not scheduled appointment at this time with vascular surgery as recommended at  4 weeks post discharge.  No further concerns at this time.  Denies new or worsening stroke/TIA symptoms.  Update 11/23/2018- : She returns for follow-up after last visit nearly 6 months ago.  She is accompanied by her husband.  She is  doing well and states that her speech difficulties have recovered almost back to baseline.  She occasionally has some hesitation.  She is also regained strength on the right side with the right arm particularly the hand is still weak.  She is able to ambulate independently indoors and not short distances but does use a walker for outdoors and long distances.  She has had no falls or injuries.  She recently got Medicaid and will be starting outpatient physical and occupational therapy in [place] soon.  She is tolerating aspirin and Brilinta but does has easy bruising.  She has not had any follow-up carotid ultrasound done to image her carotid stent.  She states her blood pressure is under good control and today it is 137/77.  She remains on Lipitor which she is tolerating well without muscle aches and pains and states her primary care physician did check lipid profile a few months ago and it was satisfactory.  She is eating healthy and has lost weight steadily.  She is also active.  She has no new complaints. ROS:   14 system review of systems is positive for  Weakness, gait difficulty and all other systems negative.  PMH:  Past Medical History:  Diagnosis Date   Asthma    Hypertension    Stroke [facility]     Social History:  Social History   Socioeconomic History   Marital status: Married    Spouse name: Not on file   Number of children: Not on file   Years of education: Not on file   Highest education level: Not on file  Occupational History   Not on file  Social Needs   Financial resource strain: Not on file   Food insecurity    Worry: Not on file    Inability: Not on file   Transportation needs    Medical: Not on file    Non-medical: Not on file  Tobacco Use   Smoking status: Never Smoker   Smokeless tobacco: Never Used  Substance and Sexual Activity   Alcohol use: No   Drug use: No   Sexual activity: Never  Lifestyle   Physical activity    Days per week:  Not on file    Minutes per session: Not on file   Stress: Not on file  Relationships   Social connections    Talks on phone: Not on file    Gets together: Not on file    Attends religious service: Not on file    Active member of club or organization: Not on file    Attends meetings of clubs or organizations: Not on file    Relationship status: Not on file   Intimate partner violence    Fear of current or ex partner: Not on file    Emotionally abused: Not on file    Physically abused: Not on file    Forced sexual activity: Not on file  Other Topics Concern   Not on file  Social History Narrative   Not on file    Medications:   Current Outpatient Medications on File Prior to Visit  Medication Sig Dispense Refill   albuterol (VENTOLIN HFA) 108 (90 Base) MCG/ACT inhaler Inhale 2 puffs into the lungs every 6 (  six) hours as needed for wheezing or shortness of breath. 16 g 3   aspirin EC 81 MG EC tablet Take 1 tablet (81 mg total) by mouth daily.     atorvastatin (LIPITOR) 40 MG tablet Take 1 tablet (40 mg total) by mouth daily at 6 PM. 30 tablet 11   fluticasone (FLONASE) 50 MCG/ACT nasal spray Place 2 sprays into both nostrils daily. 48 g 3   Fluticasone-Salmeterol (ADVAIR DISKUS) 250-50 MCG/DOSE AEPB Inhale 1 puff into the lungs 2 (two) times daily. 240 each 3   lisinopril (ZESTRIL) 5 MG tablet Take 1 tablet (5 mg total) by mouth daily. 90 tablet 3   No current facility-administered medications on file prior to visit.     Allergies:  No Known Allergies  Physical Exam General: obese middle aged Caucasian lady , seated, in no evident distress Head: head normocephalic and atraumatic.  Neck: supple with no carotid or supraclavicular bruits Cardiovascular: regular rate and rhythm, no murmurs Musculoskeletal: no deformity Skin:  no rash/petichiae Vascular:  Normal pulses all extremities Vitals:   11/23/18 0942  BP: 137/77  Pulse: 79  Temp: (!) 97.5 F (36.4 C)    Neurologic Exam Mental Status: Awake and fully alert. Oriented to place and time. Recent and remote memory intact. Attention span, concentration and fund of knowledge appropriate. Mood and affect appropriate. Speech slightly dysfluent but no paraphasias. Able to comprehend, name and repeat well. Cranial Nerves: Fundoscopic exam reveals sharp disc margins. Pupils equal, briskly reactive to light. Extraocular movements full without nystagmus. Visual fields full to confrontation. Hearing intact. Facial sensation intact. Face, tongue, palate moves normally and symmetrically.  Motor: spatic right hemiparesis with RUE 3/5 and RLE 4/5 with increased tone. Severe weakness right grip and intrinsic hand muscles. Normal strength on left side. Sensory.: intact to touch ,pinprick .position and vibratory sensation.  Coordination: Rapid alternating movements normal in all extremities. Finger-to-nose and heel-to-shin performed accurately bilaterally. Gait and Station: Arises from chair with mild difficulty. Stance is normal. Gait demonstrates  Spatic ataxic gait with circumduction Reflexes: 2+ and asymmetric and brisker on right. Toes downgoing.   NIHSS  3 Modified Rankin  2   ASSESSMENT: Kristin Solis is a 63 y.o. year old female here with left MCA infarct status post TPA with TICI 3 reperfusion left ICA rescue stent on 04/17/2018 secondary to large vessel disease extracranial carotid stenosis. Vascular risk factors include HTN, HLD, new diagnosis of OSA, carotid stenosis and obesity.  She has been stable from a stroke standpoint with residual deficits of right hemiparesis.      PLAN: I had a long d/w patient and her husband about her recent stroke, risk for recurrent stroke/TIAs, personally independently reviewed imaging studies and stroke evaluation results and answered questions.Continue aspirin 81 mg daily  but stop brillinta as it has been more than 6 months since her left carotid stent for secondary  stroke prevention and maintain strict control of hypertension with blood pressure goal below 130/90, diabetes with hemoglobin A1c goal below 6.5% and lipids with LDL cholesterol goal below 70 mg/dL. I also advised the patient to eat a healthy diet with plenty of whole grains, cereals, fruits and vegetables, exercise regularly and maintain ideal body weight. Continue outpatient PT/OT. Consider participation in TRANSPORT 2 stroke trial at [facility] if interested.  Followup in the future with my nurse practitioner Shanda BumpsJessica in 6 months or call earlier if necessary. Greater than 50% of time during this 25 minute visit was spent on counseling,explanation  of diagnosis stroke, carotid stent, planning of further management, discussion with patient and family and coordination of care Delia Heady, MD  [facility] [address] [place], [place] [address]  Phone [phone] Fax [phone] Note: This document was prepared with digital dictation and possible smart phrase technology. Any transcriptional errors that result from this process are unintentional

## 2018-11-29 ENCOUNTER — Ambulatory Visit: Payer: Self-pay | Admitting: *Deleted

## 2018-11-29 ENCOUNTER — Ambulatory Visit: Payer: Medicaid Other | Admitting: Nurse Practitioner

## 2018-11-29 DIAGNOSIS — Z8673 Personal history of transient ischemic attack (TIA), and cerebral infarction without residual deficits: Secondary | ICD-10-CM

## 2018-11-29 DIAGNOSIS — E782 Mixed hyperlipidemia: Secondary | ICD-10-CM

## 2018-11-29 DIAGNOSIS — I10 Essential (primary) hypertension: Secondary | ICD-10-CM

## 2018-11-29 NOTE — Chronic Care Management (AMB) (Signed)
Chronic Care Management   Follow Up Note   11/29/2018 Name: Kristin Solis MRN: [id number] DOB: April 22, 1955  Referred by: [name], NP Reason for referral : Chronic Care Management (HTN, CVA) and Care Coordination ([facility]illing)   Kristin Solis is a 63 y.o. year old female who is a primary care patient of [name], [name], NP. The CCM team was consulted for assistance with chronic disease management and care coordination needs.    Review of patient status, including review of consultants reports, relevant laboratory and other test results, and collaboration with appropriate care team members and the patient's provider was performed as part of comprehensive patient evaluation and provision of chronic care management services.    SDOH (Social Determinants of Health) screening performed today: Financial Strain  Physical Activity. See Care Plan for related entries.   Advanced Directives Status: N See Care Plan and Vynca application for related entries.  Outpatient Encounter Medications as of 11/29/2018  Medication Sig  . albuterol (VENTOLIN HFA) 108 (90 Base) MCG/ACT inhaler Inhale 2 puffs into the lungs every 6 (six) hours as needed for wheezing or shortness of breath.  [name] aspirin EC 81 MG EC tablet Take 1 tablet (81 mg total) by mouth daily.  [name] atorvastatin (LIPITOR) 40 MG tablet Take 1 tablet (40 mg total) by mouth daily at 6 PM.  . fluticasone (FLONASE) 50 MCG/ACT nasal spray Place 2 sprays into both nostrils daily.  . Fluticasone-Salmeterol (ADVAIR DISKUS) 250-50 MCG/DOSE AEPB Inhale 1 puff into the lungs 2 (two) times daily.  [name] lisinopril (ZESTRIL) 5 MG tablet Take 1 tablet (5 mg total) by mouth daily.   No facility-administered encounter medications on file as of 11/29/2018.      Goals Addressed            This Visit's Progress   . RN-"I need help getting therapy" (pt-stated)       Current Barriers:  [name] Knowledge Deficits related to options for therapy post CVa .  Corporate treasurer.   Nurse Case Manager Clinical Goal(s):  [name] Over the next 30 days, patient will verbalize understanding of plan for outpatient therapy options  Interventions:  . Patient requests I assist her by calling the hospital [facility] and make sure they received her paper work.  . [email or web address] [phone] . Placed a call to billing, spoke with [name]. She stated they did receive the patient's paperwork and have mailed her a letter explaining their decision.  . According to [name] the patient does not have the 5,000$ worth of medical bills at this time to meet the assistance requirements. The patient has 4800$ in collections and 980$ outstanding. The billing dept does an 8 month look back. . Called patient to let her know this information. Also gave her the number to the billing department to discuss it with them.  . Patient stated she has been set up with PT/OT through [name] and starts next week.  . Patient stated she is regularly checking b/p and they are good: 123/77-133/77 . Patient states she has all of her medications.    Patient Self Care Activities:  . Currently UNABLE TO independently perform ADL's including self care activities such as bathing, dressing because of stroke deficits   Plan:  . RNCM will follow up with patient over the next 30 days to ensure initiation of services or options provided by outpatient PT   Please see past updates related to this goal by clicking on the "Past Updates" button in  the selected goal          The care management team will reach out to the patient again over the next 30 days.  The patient has been provided with contact information for the care management team and has been advised to call with any health related questions or concerns.    [name] [name] RN, BSN Nurse Case Editor, commissioning Family Practice/[facility] Care Management  [phone]) Business  Mobile

## 2018-11-30 ENCOUNTER — Telehealth: Payer: Self-pay | Admitting: Neurology

## 2018-11-30 NOTE — Telephone Encounter (Signed)
[facility] called and stated they need a pre authorization for pts VAS US CAROTID

## 2018-11-30 NOTE — Telephone Encounter (Signed)
Cherie Ouch [id number] 11/30/2018 - 05/29/2019

## 2018-12-01 ENCOUNTER — Other Ambulatory Visit: Payer: Self-pay

## 2018-12-01 ENCOUNTER — Ambulatory Visit (HOSPITAL_COMMUNITY)
Admission: RE | Admit: 2018-12-01 | Discharge: 2018-12-01 | Disposition: A | Discharge status: Home / Self Care | Payer: Medicaid Other | Source: Ambulatory Visit | Attending: Neurology | Admitting: Neurology

## 2018-12-01 DIAGNOSIS — G8110 Spastic hemiplegia affecting unspecified side: Secondary | ICD-10-CM | POA: Diagnosis not present

## 2018-12-05 ENCOUNTER — Other Ambulatory Visit: Payer: Self-pay

## 2018-12-05 ENCOUNTER — Ambulatory Visit: Payer: Medicaid Other | Admitting: Pharmacist

## 2018-12-05 DIAGNOSIS — Z79899 Other long term (current) drug therapy: Secondary | ICD-10-CM

## 2018-12-05 NOTE — Progress Notes (Signed)
  Medication Management Clinic Visit Note  Patient: Kristin Solis MRN: [id number] Date of Birth: 03/26/1955 PCP: [name], NP   [name] 63 y.o. female presents for a medication therapy management visit today.  There were no vitals taken for this visit.  Patient Information   Past Medical History:  Diagnosis Date  . Asthma   . Hypertension   . Stroke [facility])       Past Surgical History:  Procedure Laterality Date  . ABDOMINAL HYSTERECTOMY  2005   total  . IR CT HEAD LTD  04/18/2018  . IR INTRAVSC STENT CERV CAROTID W/O EMB-PROT MOD SED INC ANGIO  04/18/2018  . IR PERCUTANEOUS ART THROMBECTOMY/INFUSION INTRACRANIAL INC DIAG ANGIO  04/18/2018  . OOPHORECTOMY    . RADIOLOGY WITH ANESTHESIA N/A 04/17/2018   Procedure: RADIOLOGY WITH ANESTHESIA;  Surgeon: Luanne Bras, MD;  Location: [facility];  Service: Radiology;  Laterality: N/A;     Family History  Problem Relation Age of Onset  . Cancer Mother        lung  . Cancer Father        brain  . Diabetes Sister   . Alcohol abuse Brother   . Stroke Son        x2  . CAD Maternal Grandmother   . Emphysema Paternal Grandfather   . Heart disease Sister        CHF    New Diagnoses:  Stroke (March 2020)            Social History   Substance and Sexual Activity  Alcohol Use No      Social History   Tobacco Use  Smoking Status Never Smoker  Smokeless Tobacco Never Used      Health Maintenance  Topic Date Due  . INFLUENZA VACCINE  09/17/2018  . COLONOSCOPY  04/25/2019 (Originally 09/13/2006)  . TETANUS/TDAP  04/25/2019 (Originally 05/18/2017)  . Hepatitis C Screening  04/25/2019 (Originally [birth year])  . MAMMOGRAM  02/13/2019  . HIV Screening  Completed     Assessment and Plan:  Asthma:  Albuterol, Advair, and flonase.  Reports albuterol use 6 times weekly without physical exertion.   Stroke:  Recent stroke admitted to [facility] stroke beginning of March, 2020.  Lipitor 40 mg, ASA, Brillinta.   Finishing last bottle of Brillinta, MD intended for therapy to end in September.    BP:  Lisinopril 5 mg daily.  BP 128/77-78 on average.  Checks BP around noon once daily.    Gerald Dexter, PharmD Pharmacy Resident  12/05/2018 4:42 PM

## 2018-12-07 ENCOUNTER — Ambulatory Visit: Payer: Self-pay | Admitting: *Deleted

## 2018-12-07 ENCOUNTER — Ambulatory Visit (INDEPENDENT_AMBULATORY_CARE_PROVIDER_SITE_OTHER): Payer: Medicaid Other | Admitting: Nurse Practitioner

## 2018-12-07 ENCOUNTER — Other Ambulatory Visit: Payer: Self-pay

## 2018-12-07 ENCOUNTER — Encounter: Payer: Self-pay | Admitting: Nurse Practitioner

## 2018-12-07 VITALS — BP 132/78 | HR 94 | Temp 99.4°F | Ht 63.0 in | Wt 210.0 lb

## 2018-12-07 DIAGNOSIS — E782 Mixed hyperlipidemia: Secondary | ICD-10-CM | POA: Diagnosis not present

## 2018-12-07 DIAGNOSIS — I10 Essential (primary) hypertension: Secondary | ICD-10-CM | POA: Diagnosis not present

## 2018-12-07 DIAGNOSIS — Z8673 Personal history of transient ischemic attack (TIA), and cerebral infarction without residual deficits: Secondary | ICD-10-CM | POA: Diagnosis not present

## 2018-12-07 DIAGNOSIS — Z23 Encounter for immunization: Secondary | ICD-10-CM | POA: Diagnosis not present

## 2018-12-07 NOTE — Assessment & Plan Note (Signed)
Ongoing deficit to right upper extremity with dependent edema present.  Has been doing self exercises, taught at previous visit, at home with slight improvement in mobility.  Starts OT/PT next week.  Maintain BP <130/90, A1C < 6.5%, and LDL < 70 for stroke prevention.

## 2018-12-07 NOTE — Assessment & Plan Note (Signed)
Chronic, ongoing with goal BP at home on readings and on repeat in office today (initial in office elevated).  Continue current medication regimen and adjust as needed.  Continue daily BP checks at home.  Discussed strict goal BP due to history of stroke.  Return in 4 months.

## 2018-12-07 NOTE — Patient Instructions (Signed)

## 2018-12-07 NOTE — Assessment & Plan Note (Signed)
Chronic, ongoing.  Continue current medication regimen and adjust as needed.  Recent LDL 49, well below neuro goal <70.

## 2018-12-07 NOTE — Progress Notes (Signed)
BP 132/78 (BP Location: Left Arm, Patient Position: Sitting)    Pulse 94    Temp 99.4 F (37.4 C) (Oral)    Ht 5\' 3"  (1.6 m)    Wt 210 lb (95.3 kg)    SpO2 97%    BMI 37.20 kg/m    Subjective:    Patient ID: Kristin Solis, female    DOB: 1956-01-22, 63 y.o.   MRN: [id number]  HPI: Kristin Solis is a 63 y.o. female  Chief Complaint  Patient presents with   Hypertension   Hyperlipidemia   HYPERTENSION / HYPERLIPIDEMIA Continues on Lisinopril 5 MG and Atorvastatin 40 MG.  History of stroke in March, is now scheduled to start OT/PT in upcoming weeks, with assistance from [facility] team in coordinating.  Sees neurology, last 11/23/2018, Dr. Leonie Man. Satisfied with current treatment? yes Duration of hypertension: chronic BP monitoring frequency: daily BP range: 128/75 yesterday --- this is average at home BP medication side effects: no Duration of hyperlipidemia: chronic Cholesterol medication side effects: no Cholesterol supplements: none Medication compliance: good compliance Aspirin: yes Recent stressors: no Recurrent headaches: no Visual changes: no Palpitations: no Dyspnea: no Chest pain: no Lower extremity edema: no Dizzy/lightheaded: no  Relevant past medical, surgical, family and social history reviewed and updated as indicated. Interim medical history since our last visit reviewed. Allergies and medications reviewed and updated.  Review of Systems  Constitutional: Negative for activity change, appetite change, diaphoresis, fatigue and fever.  Respiratory: Negative for cough, chest tightness and shortness of breath.   Cardiovascular: Negative for chest pain, palpitations and leg swelling.  Gastrointestinal: Negative for abdominal distention, abdominal pain, constipation, diarrhea, nausea and vomiting.  Endocrine: Negative for cold intolerance, heat intolerance, polydipsia, polyphagia and polyuria.  Neurological: Negative for dizziness, syncope, weakness,  light-headedness, numbness and headaches.  Psychiatric/Behavioral: Negative.     Per HPI unless specifically indicated above     Objective:    BP 132/78 (BP Location: Left Arm, Patient Position: Sitting)    Pulse 94    Temp 99.4 F (37.4 C) (Oral)    Ht 5\' 3"  (1.6 m)    Wt 210 lb (95.3 kg)    SpO2 97%    BMI 37.20 kg/m   Wt Readings from Last 3 Encounters:  12/07/18 210 lb (95.3 kg)  11/23/18 215 lb 6.4 oz (97.7 kg)  09/28/18 219 lb (99.3 kg)    Physical Exam Vitals signs and nursing note reviewed.  Constitutional:      General: She is awake. She is not in acute distress.    Appearance: She is well-developed. She is obese. She is not ill-appearing.  HENT:     Head: Normocephalic.     Right Ear: Hearing normal.     Left Ear: Hearing normal.  Eyes:     General: Lids are normal.        Right eye: No discharge.        Left eye: No discharge.     Conjunctiva/sclera: Conjunctivae normal.     Pupils: Pupils are equal, round, and reactive to light.  Neck:     Musculoskeletal: Normal range of motion and neck supple.     Vascular: No carotid bruit.  Cardiovascular:     Rate and Rhythm: Normal rate and regular rhythm.     Heart sounds: Normal heart sounds. No murmur. No gallop.   Pulmonary:     Effort: Pulmonary effort is normal. No accessory muscle usage or respiratory distress.  Breath sounds: Normal breath sounds.  Abdominal:     General: Bowel sounds are normal.     Palpations: Abdomen is soft.  Musculoskeletal:     Right hand: She exhibits decreased range of motion and swelling. She exhibits no tenderness and normal capillary refill. Normal sensation noted. Decreased strength noted.     Left hand: She exhibits normal range of motion, no tenderness, normal capillary refill and no swelling. Normal sensation noted. Normal strength noted.     Right lower leg: Edema (trace) present.     Left lower leg: Edema (trace) present.     Comments: Dependent edema present to right hand  (stroke affected side) with decrease strength and ROM present.  LUE full ROM present.  Swelling to right lower arm improved from previous exam and mobility (she has been exercising hand at home).  Skin:    General: Skin is warm and dry.  Neurological:     Mental Status: She is alert and oriented to person, place, and time.     Gait: Gait is intact.     Comments: Gait stable with walker, was able to walk without assistance today from patient room to scale.  Psychiatric:        Attention and Perception: Attention normal.        Mood and Affect: Mood normal.        Behavior: Behavior normal. Behavior is cooperative.        Thought Content: Thought content normal.        Judgment: Judgment normal.     Results for orders placed or performed in visit on 09/28/18  CBC with Differential/Platelet  Result Value Ref Range   WBC 6.3 3.4 - 10.8 x10E3/uL   RBC 4.88 3.77 - 5.28 x10E6/uL   Hemoglobin 13.4 11.1 - 15.9 g/dL   Hematocrit 95.641.3 21.334.0 - 46.6 %   MCV 85 79 - 97 fL   MCH 27.5 26.6 - 33.0 pg   MCHC 32.4 31.5 - 35.7 g/dL   RDW 08.614.5 57.811.7 - 46.915.4 %   Platelets 296 150 - 450 x10E3/uL   Neutrophils 80 Not Estab. %   Lymphs 11 Not Estab. %   Monocytes 6 Not Estab. %   Eos 1 Not Estab. %   Basos 1 Not Estab. %   Neutrophils Absolute 5.1 1.4 - 7.0 x10E3/uL   Lymphocytes Absolute 0.7 0.7 - 3.1 x10E3/uL   Monocytes Absolute 0.4 0.1 - 0.9 x10E3/uL   EOS (ABSOLUTE) 0.1 0.0 - 0.4 x10E3/uL   Basophils Absolute 0.0 0.0 - 0.2 x10E3/uL   Immature Granulocytes 1 Not Estab. %   Immature Grans (Abs) 0.0 0.0 - 0.1 x10E3/uL  Comprehensive metabolic panel  Result Value Ref Range   Glucose 139 (H) 65 - 99 mg/dL   BUN 13 8 - 27 mg/dL   Creatinine, Ser 6.290.99 0.57 - 1.00 mg/dL   GFR calc non Af Amer 61 >59 mL/min/1.73   GFR calc Af Amer 71 >59 mL/min/1.73   BUN/Creatinine Ratio 13 12 - 28   Sodium 146 (H) 134 - 144 mmol/L   Potassium 3.7 3.5 - 5.2 mmol/L   Chloride 109 (H) 96 - 106 mmol/L   CO2 24 20 -  29 mmol/L   Calcium 9.5 8.7 - 10.3 mg/dL   Total Protein 6.2 6.0 - 8.5 g/dL   Albumin 3.9 3.8 - 4.8 g/dL   Globulin, Total 2.3 1.5 - 4.5 g/dL   Albumin/Globulin Ratio 1.7 1.2 - 2.2   Bilirubin Total  0.4 0.0 - 1.2 mg/dL   Alkaline Phosphatase 125 (H) 39 - 117 IU/L   AST 17 0 - 40 IU/L   ALT 17 0 - 32 IU/L  Lipid Panel w/o Chol/HDL Ratio  Result Value Ref Range   Cholesterol, Total 128 100 - 199 mg/dL   Triglycerides 78 0 - 149 mg/dL   HDL 63 >60 mg/dL   VLDL Cholesterol Cal 16 5 - 40 mg/dL   LDL Calculated 49 0 - 99 mg/dL  TSH  Result Value Ref Range   TSH 1.320 0.450 - 4.500 uIU/mL  HgB A1c  Result Value Ref Range   Hgb A1c MFr Bld 5.6 4.8 - 5.6 %   Est. average glucose Bld gHb Est-mCnc 114 mg/dL      Assessment & Plan:   Problem List Items Addressed This Visit      Cardiovascular and Mediastinum   Essential hypertension, benign - Primary    Chronic, ongoing with goal BP at home on readings and on repeat in office today (initial in office elevated).  Continue current medication regimen and adjust as needed.  Continue daily BP checks at home.  Discussed strict goal BP due to history of stroke.  Return in 4 months.          Other   History of stroke    Ongoing deficit to right upper extremity with dependent edema present.  Has been doing self exercises, taught at previous visit, at home with slight improvement in mobility.  Starts OT/PT next week.  Maintain BP <130/90, A1C < 6.5%, and LDL < 70 for stroke prevention.      Mixed hyperlipidemia    Chronic, ongoing.  Continue current medication regimen and adjust as needed.  Recent LDL 49, well below neuro goal <70.       Other Visit Diagnoses    Flu vaccine need       Relevant Orders   Flu Vaccine QUAD 36+ mos IM (Completed)       Follow up plan: Return in about 4 months (around 04/09/2019) for HTN/HLD.

## 2018-12-07 NOTE — Chronic Care Management (AMB) (Signed)
Chronic Care Management   Follow Up Note   12/07/2018 Name: Kristin Solis MRN: [id number] DOB: Nov 26, 1955  Referred by: [name], NP Reason for referral : No chief complaint on file.   Kristin Solis is a 63 y.o. year old female who is a primary care patient of [name], [name], NP. The CCM team was consulted for assistance with chronic disease management and care coordination needs.    Review of patient status, including review of consultants reports, relevant laboratory and other test results, and collaboration with appropriate care team members and the patient's provider was performed as part of comprehensive patient evaluation and provision of chronic care management services.    SDOH (Social Determinants of Health) screening performed today: Financial Strain  Physical Activity. See Care Plan for related entries.   Advanced Directives Status: N See Care Plan and Vynca application for related entries.  Outpatient Encounter Medications as of 12/07/2018  Medication Sig  . albuterol (VENTOLIN HFA) 108 (90 Base) MCG/ACT inhaler Inhale 2 puffs into the lungs every 6 (six) hours as needed for wheezing or shortness of breath.  [name] aspirin EC 81 MG EC tablet Take 1 tablet (81 mg total) by mouth daily.  [name] atorvastatin (LIPITOR) 40 MG tablet Take 1 tablet (40 mg total) by mouth daily at 6 PM.  . fluticasone (FLONASE) 50 MCG/ACT nasal spray Place 2 sprays into both nostrils daily.  . Fluticasone-Salmeterol (ADVAIR DISKUS) 250-50 MCG/DOSE AEPB Inhale 1 puff into the lungs 2 (two) times daily.  [name] lisinopril (ZESTRIL) 5 MG tablet Take 1 tablet (5 mg total) by mouth daily.   No facility-administered encounter medications on file as of 12/07/2018.      Goals Addressed            This Visit's Progress   . RN-"I need help getting therapy" (pt-stated)       Current Barriers:  [name] Knowledge Deficits related to options for therapy post CVa . Film/video editor.   Nurse Case  Manager Clinical Goal(s):  [name] Over the next 30 days, patient will verbalize understanding of plan for outpatient therapy options  Interventions:  Met with patient in person at PCP visit . Patient stated she has been set up with PT/OT through [facility] and starts next week.  . Patient stated she feels well and is improving. . Patient concerned about her recent vascular study of her neck- PCP was able to discuss this with her. . Patient stated she is regularly checking b/p and they are good: 123/77-133/77 . Med review done. Patient education done on inhalers. Patient states she has all of her medications.  . No acute ccm needs at this time   Patient Self Care Activities:  . Currently UNABLE TO independently perform ADL's including self care activities such as bathing, dressing because of stroke deficits   Plan:  . RNCM will follow up with patient over the next 30 days to ensure initiation of services or options provided by outpatient PT   Please see past updates related to this goal by clicking on the "Past Updates" button in the selected goal          The care management team will reach out to the patient again over the next 30 days.  The patient has been provided with contact information for the care management team and has been advised to call with any health related questions or concerns.    Breely Panik RN, BSN Nurse Case Manager [place]  Management  [phone]) Business Mobile

## 2018-12-08 NOTE — Patient Instructions (Signed)
Thank you allowing the Chronic Care Management Team to be a part of your care! It was a pleasure speaking with you today!  CCM (Chronic Care Management) Team   Ekta Dancer RN, BSN Nurse Care Coordinator  [phone]  [facility] PharmD  Clinical Pharmacist  [phone]  [name] LCSW Clinical Social Worker [phone]  Goals Addressed            This Visit's Progress   . RN-"I need help getting therapy" (pt-stated)       Current Barriers:  [name] Knowledge Deficits related to options for therapy post CVa . Film/video editor.   Nurse Case Manager Clinical Goal(s):  [name] Over the next 30 days, patient will verbalize understanding of plan for outpatient therapy options  Interventions:  Met with patient in person at PCP visit . Patient stated she has been set up with PT/OT through [facility] and starts next week.  . Patient stated she feels well and is improving. . Patient concerned about her recent vascular study of her neck- PCP was able to discuss this with her. . Patient stated she is regularly checking b/p and they are good: 123/77-133/77 . Med review done. Patient education done on inhalers. Patient states she has all of her medications.  . No acute ccm needs at this time   Patient Self Care Activities:  . Currently UNABLE TO independently perform ADL's including self care activities such as bathing, dressing because of stroke deficits   Plan:  . RNCM will follow up with patient over the next 30 days to ensure initiation of services or options provided by outpatient PT   Please see past updates related to this goal by clicking on the "Past Updates" button in the selected goal         The patient verbalized understanding of instructions provided today and declined a print copy of patient instruction materials.   The patient has been provided with contact information for the care management team and has been advised to call with any health related questions or  concerns.

## 2018-12-13 ENCOUNTER — Ambulatory Visit: Payer: Medicaid Other | Attending: Nurse Practitioner | Admitting: Physical Therapy

## 2018-12-13 ENCOUNTER — Other Ambulatory Visit: Payer: Self-pay

## 2018-12-13 ENCOUNTER — Encounter: Payer: Self-pay | Admitting: Physical Therapy

## 2018-12-13 VITALS — BP 146/72 | HR 79

## 2018-12-13 DIAGNOSIS — R2681 Unsteadiness on feet: Secondary | ICD-10-CM

## 2018-12-13 DIAGNOSIS — M6281 Muscle weakness (generalized): Secondary | ICD-10-CM | POA: Diagnosis not present

## 2018-12-13 DIAGNOSIS — R2689 Other abnormalities of gait and mobility: Secondary | ICD-10-CM

## 2018-12-13 DIAGNOSIS — Z8673 Personal history of transient ischemic attack (TIA), and cerebral infarction without residual deficits: Secondary | ICD-10-CM

## 2018-12-13 NOTE — Therapy (Signed)
[place] MAIN [facility] SERVICES [address] [place], [place], [address] Phone: [phone]   Fax:  [phone]  Physical Therapy Evaluation  Patient Details  Name: Kristin Solis MRN: [id number] Date of Birth: 27-Jun-1955 Referring Provider (PT): [name]   Encounter Date: 12/13/2018  PT End of Session - 12/13/18 1709    Visit Number  1    Number of Visits  25    Date for PT Re-Evaluation  03/07/19    Authorization Type  eval 10/27    PT Start Time  1430    PT Stop Time  1530    PT Time Calculation (min)  60 min    Equipment Utilized During Treatment  Gait belt    Activity Tolerance  Patient tolerated treatment well;Patient limited by fatigue    Behavior During Therapy  WFL for tasks assessed/performed       Past Medical History:  Diagnosis Date  . Asthma   . Hypertension   . Stroke [facility])     Past Surgical History:  Procedure Laterality Date  . ABDOMINAL HYSTERECTOMY  2005   total  . IR CT HEAD LTD  04/18/2018  . IR INTRAVSC STENT CERV CAROTID W/O EMB-PROT MOD SED INC ANGIO  04/18/2018  . IR PERCUTANEOUS ART THROMBECTOMY/INFUSION INTRACRANIAL INC DIAG ANGIO  04/18/2018  . OOPHORECTOMY    . RADIOLOGY WITH ANESTHESIA N/A 04/17/2018   Procedure: RADIOLOGY WITH ANESTHESIA;  Surgeon: Luanne Bras, MD;  Location: [facility];  Service: Radiology;  Laterality: N/A;    Vitals:   12/13/18 1440  BP: (!) 146/72  Pulse: 79     Subjective Assessment - 12/13/18 1430    Subjective  Patient states she ha snot received therapy for her stroke since its onset; some experience with [facility] through telehealth, but has notreceived hands-on attention in ~7 months. Her walking and mobility feels slow and she fatigues quickly. States she feels most impaired by her limited RUE ability. Also claims that she has been feeling depressed since her stroke onset.    Patient is accompained by:  Family member    Pertinent History  Patient is a 63 y.o.  female with history of stroke receiving care previously from [facility] at [facility]. She was initially admitted to ED on 04/17/18 with dysphagia and right sided weakness s/p left MCA CVA, extubated on 04/19/18 and discharged on 04/23/18 to private residence. Was independent with ADLs/IADLs prior to admission. Inpatient PT reported decreased safety awareness and awareness of deficits, with deficits in the areas of balance, ambulation ability, transfers. Was able to ambulate 125 feet in hospital with min guard without AD, and independent with bed mobility.    Limitations  Lifting;Standing;Walking;Writing;House hold activities    How long can you sit comfortably?  As long as desired    How long can you stand comfortably?  5 minutes    How long can you walk comfortably?  Able to grocery shop, requires a seated rest break for a couple of minutes.    Patient Stated Goals  Wants to be able to use right arm and hand, and to feel more confortable with going shopping with her husband.    Currently in Pain?  No/denies    Multiple Pain Sites  No         [facility] PT Assessment - 12/14/18 0001      Assessment   Medical Diagnosis  CVA    Referring Provider (PT)  [name]  Onset Date/Surgical Date  04/17/18    Hand Dominance  Right    Next MD Visit  09/2019    Prior Therapy  None      Precautions   Precautions  Fall      Balance Screen   Has the patient fallen in the past 6 months  No    Has the patient had a decrease in activity level because of a fear of falling?   No    Is the patient reluctant to leave their home because of a fear of falling?   Yes      Home Environment   Living Environment  Private residence    Living Arrangements  Spouse/significant other    Available Help at Discharge  Family    Type of Home  House    Home Access  Stairs to enter    Entrance Stairs-Number of Steps  2    Entrance Stairs-Rails  Right;Left;Can reach both    Home Layout  Two level;Laundry or work area  in basement;Full bath on main level;Able to live on main level with bedroom/bathroom    Alternate Level Stairs-Number of Steps  10-12    Alternate Level Stairs-Rails  Right    Home Equipment  Cane - single point;Bedside commode;Shower seat;Hand held shower head;Other (comment)   Rollator for public use and longer distances   Additional Comments  Can't use railing with RUE      Prior Function   Level of Independence  Independent    Vocation  On disability    Leisure  Being outside, shopping, sit on porch, watch TV.      Cognition   Overall Cognitive Status  Within Functional Limits for tasks assessed       PAIN: None this date; does have pain (unspecified amount) when performing bed mobility and RUE receives pressure from BW.   POSTURE: Slumped posture leaning slightly toward right side, RUE limp on lap. Slight thoracic kyphosis.    PROM/AROM: Left UE PROM WNL except shoulder flex 114 deg. Right UE not [facility] for AROM gross mobility, with shoulder flex at 34 deg. BLE appear WNL for mobility.   STRENGTH:  Graded on a 0-5 scale Muscle Group Left Right  Hip Flex 5 3+  Hip Abd Def. Def.  Hip Add Def. Def.  Hip Ext Def. Def.  Hip IR/ER 5 5  Knee Flex 5 4+  Knee Ext 5 4+  Ankle DF 5 5  Ankle PF 5 5   Hip MMT deferred to next visit due to RUE pain with bed mobility positions and to avoid exacerbating acute laceration on patient's RUE.   Unable to make a fist with RUE.  SENSATION: WNL BLE   COORDINATION: Heel-shin test smooth BLE, WNL Finger opposition WNL LUE, impaired RUE and able to only touch 2 fingers with thumb.   FUNCTIONAL MOBILITY: Patient states she has high difficulty with bed mobility due to pain/discomfort of RUE in weightbearing, did not assess this date. Uses LUE for armchair propulsion to stand from sitting.   GAIT: Demonstrates step-through gait with RLE drag, decreased RLE step length and hip drop that becomes more pronounced with fatigue, requires seated rest  break with ambulation >4 min. Exhibits postural sway with slight gait deviation to right side, which may be caused by impaired ability to push rollator handle with RUE.  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      PT Short Term Goals - 12/13/18 1732      PT SHORT TERM GOAL #1   Title  Patient will be independent in initial home exercise program to improve strength/mobility for better functional independence with ADLs.    Baseline  Not made yet.    Time  6    Period  Weeks    Status  New    Target Date  01/24/19      PT SHORT TERM GOAL #2   Title  Patient (> 23 years old) will complete five times sit to stand test in < 15 seconds indicating an increased LE strength and improved balance for decreased falls risk.    Baseline  12/13/18: 17.52 sec;    Time  6    Period  Weeks    Status  New    Target Date  01/24/19        PT Long Term Goals - 12/13/18 1743      PT LONG TERM GOAL #1   Title  Patient will increase 10 meter walk test to >1.37m/s as to improve gait speed for better community ambulation and to reduce fall risk.    Baseline  12/13/18: 0.64 m/s with rollator, 0.72 m/s without AD;    Time  12    Period  Weeks    Status  New    Target Date  03/07/19      PT LONG TERM GOAL #2   Title  Patient will increase six minute walk test distance to >1000 for progression to community ambulator and improve gait ability.    Baseline  12/13/18: 610 ft with rollator;    Time  12    Period  Weeks    Status  New    Target Date  03/07/19      PT LONG TERM  GOAL #3   Title  Patient will be require no assist with ascend/descend 3 steps using Least restrictive assistive device without LOB to improve ability to get in/out of home.    Baseline  Dependent on rail assist to reach front door.    Time  12    Period  Weeks    Status  New    Target Date  03/07/19      PT LONG TERM GOAL #4   Title  Patient will increase BLE gross strength to 4+/5 as to improve functional strength for independent gait, increased standing tolerance and increased ADL ability.    Baseline  12/13/18: RLE hip weakness;    Time  12    Period  Weeks    Status  New    Target Date  03/07/19             Plan - 12/13/18 1711    Clinical Impression Statement  Patient is a 63 year old female who presents to PT eval for gait and imbalance s/p CVA on 04/17/18. Patient has decreased gait speed, transfer ability, balance and hip strength, outcomes indicate a falls risk. Patient will benefit from skilled PT intervention to improve balance, gait safety, strength and  transfer ability.    Personal Factors and Comorbidities  Age;Comorbidity 2;Fitness;Time since onset of injury/illness/exacerbation    Comorbidities  HTN, Hx of stroke    Examination-Activity Limitations  Bathing;Bed Mobility;Bend;Carry;Dressing;Lift;Locomotion Level;Reach Overhead;Squat;Stairs;Stand;Transfers    Examination-Participation Restrictions  Cleaning;Community Activity;Driving;Laundry;Shop;Yard Work    Stability/Clinical Decision Making  Stable/Uncomplicated    Clinical Decision Making  Low    Rehab Potential  Good    PT Frequency  2x / week    PT Duration  12 weeks    PT Treatment/Interventions  ADLs/Self Care Home Management;Aquatic Therapy;Cryotherapy;Electrical Stimulation;Moist Heat;DME Instruction;Gait training;Stair training;Functional mobility training;Therapeutic activities;Therapeutic exercise;Balance training;Neuromuscular re-education;Patient/family education;Orthotic Fit/Training;Manual  techniques;Compression bandaging;Passive range of motion;Dry needling;Energy conservation;Taping    PT Next Visit Plan  Assess hip strength (abd, add, ext), balance (BBT), develop HEP    PT Home Exercise Plan  Not developed yet.    Consulted and Agree with Plan of Care  Patient       Patient will benefit from skilled therapeutic intervention in order to improve the following deficits and impairments:  Abnormal gait, Decreased balance, Decreased endurance, Decreased mobility, Difficulty walking, Hypomobility, Decreased range of motion, Improper body mechanics, Decreased activity tolerance, Decreased strength, Impaired UE functional use, Postural dysfunction  Visit Diagnosis: Unsteadiness on feet  Muscle weakness (generalized)  Other abnormalities of gait and mobility  History of stroke     Problem List Patient Active Problem List   Diagnosis Date Noted  . Mixed hyperlipidemia 09/28/2018  . Middle cerebral artery embolism, left 04/18/2018  . History of stroke 04/17/2018  . IFG (impaired fasting glucose) 03/28/2018  . Essential hypertension, benign 01/15/2017  . Asthma 01/15/2017   Fayrene FearingJames A. Earlene Plateravis, SPT This entire session was performed under direct supervision and direction of a licensed therapist/therapist assistant . I have personally read, edited and approve of the note as written.  Trotter,Margaret PT, DPT 12/14/2018, 11:01 AM  [place] [facility] [address] [place], [place], [id number] Phone: [phone]   Fax:  [phone]  Name: Dionicia AblerBeverly J Raffety MRN: [id number] Date of Birth: 09-17-1955

## 2018-12-19 ENCOUNTER — Encounter: Payer: No Typology Code available for payment source | Admitting: Occupational Therapy

## 2018-12-19 DIAGNOSIS — H524 Presbyopia: Secondary | ICD-10-CM | POA: Diagnosis not present

## 2018-12-21 ENCOUNTER — Other Ambulatory Visit: Payer: Self-pay

## 2018-12-21 ENCOUNTER — Ambulatory Visit: Payer: Medicaid Other | Admitting: Occupational Therapy

## 2018-12-21 ENCOUNTER — Ambulatory Visit: Payer: Medicaid Other | Attending: Nurse Practitioner

## 2018-12-21 ENCOUNTER — Encounter: Payer: Self-pay | Admitting: Occupational Therapy

## 2018-12-21 ENCOUNTER — Ambulatory Visit: Payer: Self-pay | Admitting: Licensed Clinical Social Worker

## 2018-12-21 DIAGNOSIS — M6281 Muscle weakness (generalized): Secondary | ICD-10-CM | POA: Insufficient documentation

## 2018-12-21 DIAGNOSIS — I69351 Hemiplegia and hemiparesis following cerebral infarction affecting right dominant side: Secondary | ICD-10-CM | POA: Diagnosis not present

## 2018-12-21 DIAGNOSIS — R278 Other lack of coordination: Secondary | ICD-10-CM | POA: Diagnosis not present

## 2018-12-21 DIAGNOSIS — Z8673 Personal history of transient ischemic attack (TIA), and cerebral infarction without residual deficits: Secondary | ICD-10-CM

## 2018-12-21 DIAGNOSIS — R600 Localized edema: Secondary | ICD-10-CM | POA: Diagnosis not present

## 2018-12-21 DIAGNOSIS — I10 Essential (primary) hypertension: Secondary | ICD-10-CM

## 2018-12-21 DIAGNOSIS — R2689 Other abnormalities of gait and mobility: Secondary | ICD-10-CM | POA: Insufficient documentation

## 2018-12-21 DIAGNOSIS — R2681 Unsteadiness on feet: Secondary | ICD-10-CM | POA: Diagnosis not present

## 2018-12-21 NOTE — Patient Instructions (Signed)
   Good day HEP :   Access Code: [email or web address]  URL: [email or web address]  Date: 12/21/2018  Prepared by: Janna Arch   Exercises Standing Heel Raise with Support - 15 reps - 2 sets - 1x daily - 7x weekly Standing Toe Raises at Chair - 15 reps - 2 sets - 1x daily - 7x weekly Mini Squat with Counter Support - 15 reps - 2 sets - 1x daily - 7x weekly Standing Tandem Balance with Counter Support - 2 reps - 2 sets - 30 hold - 1x daily - 7x weekly Standing March with Unilateral Counter Support - 10 reps - 2 sets - 5 hold - 1x daily - 7x weekly Side to Side Weight Shift with Counter Support - 10 reps - 2 sets - 5 hold - 1x daily - 7x weekly   Bad Day HEP:  Access Code: [email or web address]  URL: [email or web address]  Date: 12/21/2018  Prepared by: Janna Arch   Exercises Seated March - 10 reps - 2 sets - 5 hold - 1x daily - 7x weekly Seated Long Arc Quad - 10 reps - 2 sets - 5 hold - 1x daily - 7x weekly Seated Hip Adduction Isometrics with Ball - 10 reps - 2 sets - 5 hold - 1x daily - 7x weekly Seated Hip Abduction - 10 reps - 2 sets - 5 hold - 1x daily - 7x weekly

## 2018-12-21 NOTE — Therapy (Signed)
[place] [facility] MAIN [facility] SERVICES [address] [place], [place], [address] Phone: [phone]   Fax:  [phone]  Physical Therapy Treatment  Patient Details  Name: Kristin Solis MRN: [id number] Date of Birth: 06-02-1955 Referring Provider (PT): Remi Deter   Encounter Date: 12/21/2018  PT End of Session - 12/21/18 1328    Visit Number  2    Number of Visits  25    Date for PT Re-Evaluation  03/07/19    Authorization Type  eval 10/27    PT Start Time  1352    PT Stop Time  1430    PT Time Calculation (min)  38 min    Equipment Utilized During Treatment  Gait belt    Activity Tolerance  Patient tolerated treatment well;Patient limited by fatigue    Behavior During Therapy  WFL for tasks assessed/performed       Past Medical History:  Diagnosis Date  . Asthma   . Hypertension   . Stroke [facility])     Past Surgical History:  Procedure Laterality Date  . ABDOMINAL HYSTERECTOMY  2005   total  . IR CT HEAD LTD  04/18/2018  . IR INTRAVSC STENT CERV CAROTID W/O EMB-PROT MOD SED INC ANGIO  04/18/2018  . IR PERCUTANEOUS ART THROMBECTOMY/INFUSION INTRACRANIAL INC DIAG ANGIO  04/18/2018  . OOPHORECTOMY    . RADIOLOGY WITH ANESTHESIA N/A 04/17/2018   Procedure: RADIOLOGY WITH ANESTHESIA;  Surgeon: Julieanne Cotton, MD;  [facility] OR;  Service: Radiology;  Laterality: N/A;    There were no vitals filed for this visit.  Subjective Assessment - 12/21/18 1411    Subjective  Patient had an occupational therapy evaluation prior to her PT session. Patient reports she fell the other day when trying to get into bed, fell onto her bottom. No injuries.    Patient is accompained by:  Family member    Pertinent History  Patient is a 63 y.o. female with history of stroke receiving care previously from [facility]. She was initially admitted to ED on 04/17/18 with dysphagia and right sided weakness s/p left MCA CVA, extubated on 04/19/18 and  discharged on 04/23/18 to private residence. Was independent with ADLs/IADLs prior to admission. Inpatient PT reported decreased safety awareness and awareness of deficits, with deficits in the areas of balance, ambulation ability, transfers. Was able to ambulate 125 feet in hospital with min guard without AD, and independent with bed mobility.    Limitations  Lifting;Standing;Walking;Writing;House hold activities    How long can you sit comfortably?  As long as desired    How long can you stand comfortably?  5 minutes    How long can you walk comfortably?  Able to grocery shop, requires a seated rest break for a couple of minutes.    Patient Stated Goals  Wants to be able to use right arm and hand, and to feel more confortable with going shopping with her husband.    Currently in Pain?  No/denies         Patient had an occupational therapy evaluation prior to her PT session. Patient reports she fell the other day when trying to get into bed, fell onto her bottom. No injuries.     Good day HEP :   Access Code: [email or web address]  URL: [email or web address]  Date: 12/21/2018  Prepared by: Kristin Solis   Exercises Standing Heel Raise with Support - 15 reps - 2 sets - 1x daily - 7x weekly  Standing Toe Raises at Chair - 15 reps - 2 sets - 1x daily - 7x weekly Mini Squat with Counter Support - 15 reps - 2 sets - 1x daily - 7x weekly Standing Tandem Balance with Counter Support - 2 reps - 2 sets - 30 hold - 1x daily - 7x weekly Standing March with Unilateral Counter Support - 10 reps - 2 sets - 5 hold - 1x daily - 7x weekly Side to Side Weight Shift with Counter Support - 10 reps - 2 sets - 5 hold - 1x daily - 7x weekly   Bad Day HEP:  Access Code: [email or web address]  URL: [email or web address]  Date: 12/21/2018  Prepared by: Kristin BardMarina Kesa Solis   Exercises Seated March - 10 reps - 2 sets - 5 hold - 1x daily - 7x weekly Seated Long Arc Quad - 10 reps - 2 sets - 5 hold - 1x daily - 7x  weekly Seated Hip Adduction Isometrics with Ball - 10 reps - 2 sets - 5 hold - 1x daily - 7x weekly Seated Hip Abduction - 10 reps - 2 sets - 5 hold - 1x daily - 7x weekly    Standing with CGA next to support surface:  Airex pad: static stand 30 seconds x 2 trials, noticeable trembling of ankles/LE's with fatigue and challenge to maintain stability Airex pad: horizontal head turns 30 seconds scanning room 10x ; cueing for arc of motion  Airex pad: vertical head turns 30 seconds, cueing for arc of motion, noticeable sway with upward gaze increasing demand on ankle righting reaction musculature Airex pad: one foot on 6" step one foot on airex pad, hold position for 30 seconds, switch legs, 2x each LE; BUE support     Pt educated throughout session about proper posture and technique with exercises. Improved exercise technique, movement at target joints, use of target muscles after min to mod verbal, visual, tactile cues         PT Education - 12/21/18 1327    Education Details  HEP: two seperate programs, one for good days one for bad days, exercise technique,    Person(s) Educated  Patient    Methods  Explanation;Demonstration;Tactile cues;Verbal cues;Handout    Comprehension  Verbalized understanding;Returned demonstration;Verbal cues required;Tactile cues required;Need further instruction       PT Short Term Goals - 12/13/18 1732      PT SHORT TERM GOAL #1   Title  Patient will be independent in initial home exercise program to improve strength/mobility for better functional independence with ADLs.    Baseline  Not made yet.    Time  6    Period  Weeks    Status  New    Target Date  01/24/19      PT SHORT TERM GOAL #2   Title  Patient (> 62 years old) will complete five times sit to stand test in < 15 seconds indicating an increased LE strength and improved balance for decreased falls risk.    Baseline  12/13/18: 17.52 sec;    Time  6    Period  Weeks    Status  New     Target Date  01/24/19        PT Long Term Goals - 12/13/18 1743      PT LONG TERM GOAL #1   Title  Patient will increase 10 meter walk test to >1.3836m/s as to improve gait speed for better community ambulation and to reduce fall risk.    Baseline  12/13/18: 0.64 m/s with rollator, 0.72 m/s without AD;    Time  12    Period  Weeks    Status  New    Target Date  03/07/19      PT LONG TERM GOAL #2   Title  Patient will increase six minute walk test distance to >1000 for progression to community ambulator and improve gait ability.    Baseline  12/13/18: 610 ft with rollator;    Time  12    Period  Weeks    Status  New    Target Date  03/07/19      PT LONG TERM GOAL #3   Title  Patient will be require no assist with ascend/descend 3 steps using Least restrictive assistive device without LOB to improve ability to get in/out of home.    Baseline  Dependent on rail assist to reach front door.    Time  12    Period  Weeks    Status  New    Target Date  03/07/19      PT LONG TERM GOAL #4   Title  Patient will increase BLE gross strength to 4+/5 as to improve functional strength for independent gait, increased standing tolerance and increased ADL ability.    Baseline  12/13/18: RLE hip weakness;    Time  12    Period  Weeks    Status  New    Target Date  03/07/19            Plan - 12/21/18 1423    Clinical Impression Statement  Patient presents to physical therapy with good motivation. She is educated on two separate HEP programs, one for days that she is more steady and one for days that are more challenging for safety. Vertical head turns less challenging than horizontal to patient.  Patient will benefit from skilled PT intervention to improve balance, gait safety, strength and transfer ability.    Personal Factors and Comorbidities  Age;Comorbidity 2;Fitness;Time since onset of injury/illness/exacerbation    Comorbidities  HTN, Hx of stroke    Examination-Activity Limitations   Bathing;Bed Mobility;Bend;Carry;Dressing;Lift;Locomotion Level;Reach Overhead;Squat;Stairs;Stand;Transfers    Examination-Participation Restrictions  Cleaning;Community Activity;Driving;Laundry;Shop;Yard Work    Stability/Clinical Decision Making  Stable/Uncomplicated    Rehab Potential  Good    PT Frequency  2x / week    PT Duration  12 weeks    PT Treatment/Interventions  ADLs/Self Care Home Management;Aquatic Therapy;Cryotherapy;Electrical Stimulation;Moist Heat;DME Instruction;Gait training;Stair training;Functional mobility training;Therapeutic activities;Therapeutic exercise;Balance training;Neuromuscular re-education;Patient/family education;Orthotic Fit/Training;Manual techniques;Compression bandaging;Passive range of motion;Dry needling;Energy conservation;Taping    PT Next Visit Plan  review HEP, balance    PT Home Exercise Plan  Not developed yet.    Consulted and Agree with Plan of Care  Patient       Patient will benefit from skilled therapeutic intervention in order to improve the following deficits and impairments:  Abnormal gait, Decreased balance, Decreased endurance, Decreased mobility, Difficulty walking, Hypomobility, Decreased range of motion, Improper body mechanics, Decreased activity tolerance, Decreased strength, Impaired UE functional use, Postural dysfunction  Visit Diagnosis: Unsteadiness on feet  Muscle weakness (generalized)  Other abnormalities of gait and mobility     Problem List Patient Active Problem List   Diagnosis Date Noted  . Mixed hyperlipidemia 09/28/2018  . Middle cerebral artery embolism, left 04/18/2018  . History of stroke 04/17/2018  . IFG (impaired fasting glucose) 03/28/2018  . Essential hypertension, benign 01/15/2017  . Asthma 01/15/2017   Kristin Solis, PT, DPT  12/21/2018, 4:09 PM  [place] MAIN [facility] SERVICES [address] [place], [place], [address] Phone: [phone]   Fax:   [phone]  Name: Kristin Solis MRN: [id number] Date of Birth: 1956-01-24

## 2018-12-21 NOTE — Chronic Care Management (AMB) (Signed)
Care Management   Follow Up Note   12/21/2018 Name: Kristin Solis MRN: [id number] DOB: 18-Aug-1955  Referred by: Kristin Skiff, NP Reason for referral : Care Coordination   Kristin Solis is a 63 y.o. year old female who is a primary care patient of [name], [name], NP. The care management team was consulted for assistance with care management and care coordination needs.    Review of patient status, including review of consultants reports, relevant laboratory and other test results, and collaboration with appropriate care team members and the patient's provider was performed as part of comprehensive patient evaluation and provision of chronic care management services.    SDOH (Social Determinants of Health) screening performed today: Financial Strain  Depression  . See Care Plan for related entries.   Advanced Directives: See Care Plan and Vynca application for related entries.   Goals Addressed    . "I need to to gain more support and cope better with daily stressors" (pt-stated)       Current Barriers:  . Financial constraints . ADL IADL limitations . Mental Health Concerns  . Limited education about Medicaid enrollment process* . Memory Deficits . Inability to perform ADL's independently . Inability to perform IADL's independently  Clinical Social Work Clinical Goal(s):  [name] Over the next 90 days, client will work with SW to address concerns related to gaining financial assistance education and getting enrolled in social security.  . Over the next 90 days, client will follow up with DSS Medicaid caseworker* as directed by SW  Interventions: . Patient interviewed and appropriate assessments performed . Provided mental health counseling with regard to patient was emotional throughout phone call due to her daily difficulty with mobility/weakness/balance. LCSW provided reflective listening and implemented appropriate interventions to help suppport patient and her emotional  needs  . Patient reports that she was only approved for Medicaid [employer] but that she would be able to get Full Adult Medicaid if she were deemed disabled.  . CSW used motivational interviewing techniques to engage pt in meaningful conversation and to assess pt's wilingess to implement appropriate self care daily to combat stressors/anxiety. CSW explored pt's coping skills and access to community services.  . Patient confirms completing need paperwork for Financial Assistance Program with [facility] and Medication Assistance Application. Patient reports having a $79,000 hospital bill. . Patient reports ongoing active communication between her and her Medicaid caseworker. Caseworker has not received notice on disability decision yet.  . Patient reports having an appointment at the social security office on 09/23/2018. [name] Discussed plans with patient for ongoing care management follow up and provided patient with direct contact information for care management team . Advised patient to keep in contact with DSS to follow up on Medicaid. Patient confirms receiving Food Stamps ($200 per month) . Assisted patient/caregiver with obtaining information about health plan benefits . UPDATE 11/4- Patient reports ongoing PT/OT through [facility]. Patient denies any current pain and reports services are very helpful for her.  . Patient reports finally receiving Medicaid card in the mail. . Patient confirms spouse provides ongoing caregiver support in the home.  Patient Self Care Activities:  . Attends all scheduled provider appointments . Calls provider office for new concerns or questions  Please see past updates related to this goal by clicking on the "Past Updates" button in the selected goal      The care management team will reach out to the patient again over the next 45  days.   [name], BSW, MSW, [name] Phone: [phone]

## 2018-12-22 DIAGNOSIS — H5213 Myopia, bilateral: Secondary | ICD-10-CM | POA: Diagnosis not present

## 2018-12-22 NOTE — Therapy (Signed)
[facility] MAIN [facility] SERVICES [address] [place], [place], [address] Phone: [phone]   Fax:  [phone]  Occupational Therapy Evaluation  Patient Details  Name: Kristin Solis MRN: [id number] Date of Birth: [birth year] Referring Provider (OT): Aura Dials   Encounter Date: 12/21/2018  OT End of Session - 12/22/18 1833    Visit Number  1    Number of Visits  13    Date for OT Re-Evaluation  02/03/19    OT Start Time  1300    OT Stop Time  1350    OT Time Calculation (min)  50 min    Activity Tolerance  Patient tolerated treatment well    Behavior During Therapy  [facility] for tasks assessed/performed       Past Medical History:  Diagnosis Date  . Asthma   . Hypertension   . Stroke [facility])     Past Surgical History:  Procedure Laterality Date  . ABDOMINAL HYSTERECTOMY  2005   total  . IR CT HEAD LTD  04/18/2018  . IR INTRAVSC STENT CERV CAROTID W/O EMB-PROT MOD SED INC ANGIO  04/18/2018  . IR PERCUTANEOUS ART THROMBECTOMY/INFUSION INTRACRANIAL INC DIAG ANGIO  04/18/2018  . OOPHORECTOMY    . RADIOLOGY WITH ANESTHESIA N/A 04/17/2018   Procedure: RADIOLOGY WITH ANESTHESIA;  Surgeon: Julieanne Cotton, MD;  Location: [facility] OR;  Service: Radiology;  Laterality: N/A;    There were no vitals filed for this visit.  Subjective Assessment - 12/21/18 1310    Subjective   Patient reports she had a CVA on April 17, 2018 and hasn't had any occupational therapy since that time.    Pertinent History  Patient is a 63 y.o. female with history of stroke,  admitted to ED on 04/17/18 with dysphagia and right sided weakness s/p left MCA CVA, extubated on 04/19/18 and discharged on 04/23/18 to private residence    Patient Stated Goals  Patient reports she would like to get her right hand to move more and be able to use for daily tasks.    Currently in Pain?  No/denies    Multiple Pain Sites  No        [facility] OT Assessment - 12/22/18 1804      Assessment   Medical Diagnosis  CVA    Referring Provider (OT)  Aura Dials    Onset Date/Surgical Date  04/17/18    Hand Dominance  Right    Prior Therapy  None      Precautions   Precautions  Fall      Balance Screen   Has the patient fallen in the past 6 months  Yes    How many times?  once    Has the patient had a decrease in activity level because of a fear of falling?   No    Is the patient reluctant to leave their home because of a fear of falling?   No      Home  Environment   Family/patient expects to be discharged to:  Private residence    Living Arrangements  Spouse/significant other    Available Help at Discharge  Family    Type of Home  House    Home Access  Stairs    Home Layout  Two level    Home Equipment  Walker - 4 wheels;Cane - single point;Bedside commode;Shower seat;Tub bench    Additional Comments  uses rollator when she goes out of the house but not at home.  Lives With  Spouse      Prior Function   Level of Independence  Independent    Vocation  On disability    Vocation Requirements  She was a Comptroller previously    Leisure  Being outside, shopping, sit on porch, watch TV.      ADL   Eating/Feeding  Needs assist with cutting food   using non dominant hand currently for self feeding   Grooming  Minimal assistance   cannot put on makeup, eyeliner. Uses left hand for grooming.   Upper Body Bathing  Modified independent    Lower Body Bathing  Set up    Upper Body Dressing  Increased time;Needs assist for fasteners   one handed buttoning currently.   Lower Body Dressing  Increased time   wears slides, cannot tie shoes.  No longer wears underwear   Toilet Transfer  Min guard;Modified independent    Toileting - Clothing Manipulation  Increased time;Minimal assistance    Tub/Shower Transfer  Min guard      IADL   Prior Level of Function Shopping  independent    Shopping  Needs to be accompanied on any shopping trip    Prior Level of Function Light  Housekeeping  independent    Light Housekeeping  Needs help with all home maintenance tasks;Does not participate in any housekeeping tasks    Prior Level of Function Meal Prep  independent    Meal Prep  Needs to have meals prepared and served    Prior Level of Function Biomedical scientist on family or friends for transportation    Prior Level of Function Medication Managment  independent    Medication Management  Takes responsibility if medication is prepared in advance in seperate dosage    Prior Level of Function Financial Management  independent    Financial Management  --   husband has always done this in the past.      Mobility   Mobility Status  Needs assist    Mobility Status Comments  rollator for community mobility      Written Expression   Dominant Hand  Right      Vision - History   Additional Comments  Patient reports she had some issues prior to the CVA with left eye feeling like she saw dots, resolved over time      Cognition   Overall Cognitive Status  Impaired/Different from baseline    Memory  Impaired    Memory Impairment  Decreased recall of new information    Problem Solving  Appears intact      Sensation   Light Touch  Appears Intact    Hot/Cold  Appears Intact      Coordination   Gross Motor Movements are Fluid and Coordinated  No    Fine Motor Movements are Fluid and Coordinated  No    9 Hole Peg Test  Right;Left    Right 9 Hole Peg Test  0   unable to pick up pegs from the well   Left 9 Hole Peg Test  26    Coordination  impaired opposition on right, can do towards the lateral side of the ring finger.       Hand Function   Right Hand Grip (lbs)  2    Right Hand Lateral Pinch  3 lbs    Right Hand 3 Point Pinch  3 lbs    Left Hand Grip (lbs)  40  Left Hand Lateral Pinch  14 lbs    Left 3 point pinch  16 lbs    Comment  2 point pinch R 1#, Left 10#        Patient reports she had a stroke on April 17, 2018 and was hospitalized on April 23, 2018. She reports she had worked earlier in the day and later went home and was on her computer and noticed her hand wouldn't move. Her husband called EMS and she was transported to the hospital for evaluation.  Patient demonstrates moderate edema in her right hand on the dorsum of the hand and in the fingers. She can perform some opposition to index and middle fingers and towards the lateral side of the ring binder. She is able to demonstrate initiation of shoulder elevation and retraction.  Patient has been currently using her left non-dominant hand to perform self-care and I ADL tasks such as bathing, grooming, dressing. She is not driving currently.   ROM RUE Shoulder elevation-1/4 range Shoulder retraction-1/4 range Shoulder flexion 35 degrees ABD 40 degrees Elbow flexion 128 degrees Elbow extension -30 degrees Wrist flexion 55 degrees,  Wrist extension 37  Full finger extension MPs Index flexion 35 degrees MF 30 RF 35 SF 40  PIPs  Index 60 degrees MF 65  RF 60 SF 55  Supination to 40 degrees       *Since patient has not had any therapy since CVA, she will likely require more than the requested visits however, has limitations with the number of therapy visits allowed for calendar year.             OT Education - 12/22/18 1833    Education Details  goals, POC, role of OT, positioning for edema control, retrograde massage    Person(s) Educated  Patient    Methods  Explanation;Demonstration    Comprehension  Verbalized understanding;Returned demonstration          OT Long Term Goals - 12/22/18 1841      OT LONG TERM GOAL #1   Title  Patient will complete home exercise program with modified independence.    Baseline  no current program    Time  6    Period  Weeks    Status  New    Target Date  02/03/19      OT LONG TERM GOAL #2   Title  Patient will demonstrate understanding of Edema control measures for her  right upper extremity and implement into daily routine.    Baseline  has not been educated on edema control    Time  3    Period  Weeks    Status  New    Target Date  01/12/19      OT LONG TERM GOAL #3   Title  Patient will demonstrate improved grip by 5 # in right Hand to be able to hold items in hand with gross grasp.    Baseline  2# on right, unable to grasp items    Time  6    Period  Weeks    Status  New    Target Date  02/03/19      OT LONG TERM GOAL #4   Title  Patient will increase range of motion of fingers in right hand to be able to grasp built-up utensils for self feeding with modified independence.    Baseline  unable to feed self or hold utensils with right hand    Time  6  Period  Months    Status  New    Target Date  02/03/19      OT LONG TERM GOAL #5   Title  Patient will improve right hand function to be able to grasp pen to sign name on important papers with fair legibility.    Baseline  unable to hold pen or sign name    Time  6    Period  Weeks    Status  New    Target Date  02/03/19      Long Term Additional Goals   Additional Long Term Goals  Yes      OT LONG TERM GOAL #6   Title  Patient will hold a cup with modified independence.    Baseline  unable at eval with right hand    Time  6    Period  Weeks    Status  New    Target Date  02/03/19      OT LONG TERM GOAL #7   Title  Patient will improve right hand function to manipulate buttons, snaps, zippers with modified independence.    Baseline  difficulty at eval and requires assist at times.    Time  6    Period  Weeks    Status  New    Target Date  02/03/19            Plan - 12/22/18 1834    Clinical Impression Statement  Patient is a 63 year old female who was diagnosed with a CVA on April 17, 2018. She was hospitalized for seven days and then discharged home she reports she did not receive any follow up therapy due to financial constraints and no insurance. Patient was referred for  outpatient occupational therapy for evaluation. Patient presents with moderate Edema in her right dominant hand, decreased active and passive range of motion in her right hand, fingers, wrist, elbow, forearm, and shoulder, decreased strength throughout right upper extremity, decreased transfers, decreased functional mobility, decreased balance, decreased ability to perform self-care tasks, decreased ability to perform basic ADL tasks, IADL tasks. She would benefit from skilled occupational therapy to maximize her safety and independence and daily tasks.    OT Occupational Profile and History  Detailed Assessment- Review of Records and additional review of physical, cognitive, psychosocial history related to current functional performance    Occupational performance deficits (Please refer to evaluation for details):  ADL's;IADL's;Work;Leisure    Body Structure / Function / Physical Skills  ADL;Coordination;GMC;UE functional use;Balance;Decreased knowledge of use of DME;Flexibility;IADL;Dexterity;FMC;Proprioception;Strength;Edema;ROM    Cognitive Skills  Memory    Psychosocial Skills  Environmental  Adaptations;Habits;Routines and Behaviors    Rehab Potential  Good    Clinical Decision Making  Limited treatment options, no task modification necessary    Comorbidities Affecting Occupational Performance:  May have comorbidities impacting occupational performance    Modification or Assistance to Complete Evaluation   No modification of tasks or assist necessary to complete eval    OT Frequency  2x / week    OT Duration  6 weeks    OT Treatment/Interventions  Self-care/ADL training;Cryotherapy;Therapeutic exercise;DME and/or AE instruction;Functional Mobility Training;Cognitive remediation/compensation;Balance training;Electrical Stimulation;Neuromuscular education;Manual Therapy;Splinting;Moist Heat;Contrast Bath;Therapeutic activities;Patient/family education;Passive range of motion    Consulted and Agree  with Plan of Care  Patient       Patient will benefit from skilled therapeutic intervention in order to improve the following deficits and impairments:   Body Structure / Function / Physical Skills: ADL, Coordination, GMC, UE functional  use, Balance, Decreased knowledge of use of DME, Flexibility, IADL, Dexterity, FMC, Proprioception, Strength, Edema, ROM Cognitive Skills: Memory Psychosocial Skills: Environmental  Adaptations, Habits, Routines and Behaviors   Visit Diagnosis: Muscle weakness (generalized)  History of stroke  Other lack of coordination  Localized edema  Hemiplegia and hemiparesis following cerebral infarction affecting right dominant side [facility])    Problem List Patient Active Problem List   Diagnosis Date Noted  . Mixed hyperlipidemia 09/28/2018  . Middle cerebral artery embolism, left 04/18/2018  . History of stroke 04/17/2018  . IFG (impaired fasting glucose) 03/28/2018  . Essential hypertension, benign 01/15/2017  . Asthma 01/15/2017   Rosbel Buckner T Tomasita Morrow, OTR/L, CLT  Chianna Spirito 12/22/2018, 6:49 PM  [place] MAIN [facility] SERVICES [address] [place], [place], [address] Phone: [phone]   Fax:  [phone]  Name: Kristin Solis MRN: [id number] Date of Birth: [birth year]

## 2018-12-26 ENCOUNTER — Ambulatory Visit: Payer: Medicaid Other | Admitting: Physical Therapy

## 2018-12-26 ENCOUNTER — Ambulatory Visit: Payer: Medicaid Other | Admitting: Occupational Therapy

## 2018-12-26 ENCOUNTER — Encounter: Payer: Self-pay | Admitting: Physical Therapy

## 2018-12-26 ENCOUNTER — Other Ambulatory Visit: Payer: Self-pay

## 2018-12-26 DIAGNOSIS — M6281 Muscle weakness (generalized): Secondary | ICD-10-CM

## 2018-12-26 DIAGNOSIS — R278 Other lack of coordination: Secondary | ICD-10-CM

## 2018-12-26 DIAGNOSIS — I69351 Hemiplegia and hemiparesis following cerebral infarction affecting right dominant side: Secondary | ICD-10-CM

## 2018-12-26 DIAGNOSIS — R2681 Unsteadiness on feet: Secondary | ICD-10-CM | POA: Diagnosis not present

## 2018-12-26 DIAGNOSIS — R600 Localized edema: Secondary | ICD-10-CM | POA: Diagnosis not present

## 2018-12-26 DIAGNOSIS — R2689 Other abnormalities of gait and mobility: Secondary | ICD-10-CM

## 2018-12-26 DIAGNOSIS — Z8673 Personal history of transient ischemic attack (TIA), and cerebral infarction without residual deficits: Secondary | ICD-10-CM | POA: Diagnosis not present

## 2018-12-26 NOTE — Therapy (Signed)
[facility] MAIN [facility] SERVICES [address] [place], [place], [address] Phone: [phone]   Fax:  [phone]  Physical Therapy Treatment  Patient Details  Name: Kristin Solis MRN: [id number] Date of Birth: 06-10-1955 Referring Provider (PT): Remi Deter   Encounter Date: 12/26/2018  PT End of Session - 12/26/18 1355    Visit Number  3    Number of Visits  25    Date for PT Re-Evaluation  03/07/19    Authorization Type  eval 10/27    PT Start Time  0146    PT Stop Time  0230    PT Time Calculation (min)  44 min    Equipment Utilized During Treatment  Gait belt    Activity Tolerance  Patient tolerated treatment well;Patient limited by fatigue    Behavior During Therapy  [facility] for tasks assessed/performed       Past Medical History:  Diagnosis Date  . Asthma   . Hypertension   . Stroke [facility])     Past Surgical History:  Procedure Laterality Date  . ABDOMINAL HYSTERECTOMY  2005   total  . IR CT HEAD LTD  04/18/2018  . IR INTRAVSC STENT CERV CAROTID W/O EMB-PROT MOD SED INC ANGIO  04/18/2018  . IR PERCUTANEOUS ART THROMBECTOMY/INFUSION INTRACRANIAL INC DIAG ANGIO  04/18/2018  . OOPHORECTOMY    . RADIOLOGY WITH ANESTHESIA N/A 04/17/2018   Procedure: RADIOLOGY WITH ANESTHESIA;  Surgeon: Julieanne Cotton, MD;  Location: [facility] OR;  Service: Radiology;  Laterality: N/A;    There were no vitals filed for this visit.  Subjective Assessment - 12/26/18 1350    Subjective  Patient is fustrated about her appointments , she thought she an OT appointment today.    Patient is accompained by:  Family member    Pertinent History  Patient is a 63 y.o. female with history of stroke receiving care previously from [facility]. She was initially admitted to ED on 04/17/18 with dysphagia and right sided weakness s/p left MCA CVA, extubated on 04/19/18 and discharged on 04/23/18 to private residence. Was independent with ADLs/IADLs prior to  admission. Inpatient PT reported decreased safety awareness and awareness of deficits, with deficits in the areas of balance, ambulation ability, transfers. Was able to ambulate 125 feet in hospital with min guard without AD, and independent with bed mobility.    Limitations  Lifting;Standing;Walking;Writing;House hold activities    How long can you sit comfortably?  As long as desired    How long can you stand comfortably?  5 minutes    How long can you walk comfortably?  Able to grocery shop, requires a seated rest break for a couple of minutes.    Patient Stated Goals  Wants to be able to use right arm and hand, and to feel more confortable with going shopping with her husband.       Treatment: Nu-step x 5 mins L 2 Leg press 100 lbs x 20 x 2 sets, 45 lbs heel raises x 20 x 2  Heel raises with  2 sec hold x 20   Neuromuscular training: standing on blue foam with head turns x 1 min  Standing feet together on blue foam with head turns x 1 min step ups from floor to 6 inch stool x 20 bilateral Step ups from blue foam to 6 inch stool x 20 BLE Lateral Step ups from blue foam to 6 inch stooal x 20 BLE 4 square side  to side , and fwd/bwd, diagonals x 10 with cGA   Pt educated throughout session about proper posture and technique with exercises. Improved exercise technique, movement at target joints, use of target muscles after min to mod verbal, visual, tactile cues. CGA and Min to mod verbal cues used throughout with increased in postural sway and LOB most seen with narrow base of support and while on uneven surfaces. Continues to have balance deficits typical with diagnosis. Patient performs intermediate level exercises without pain behaviors and needs verbal cuing for postural alignment and head positioning                        PT Education - 12/26/18 1351    Education Details  HEP    Person(s) Educated  Patient    Methods  Explanation;Demonstration    Comprehension   Need further instruction;Returned demonstration       PT Short Term Goals - 12/13/18 1732      PT SHORT TERM GOAL #1   Title  Patient will be independent in initial home exercise program to improve strength/mobility for better functional independence with ADLs.    Baseline  Not made yet.    Time  6    Period  Weeks    Status  New    Target Date  01/24/19      PT SHORT TERM GOAL #2   Title  Patient (> 58 years old) will complete five times sit to stand test in < 15 seconds indicating an increased LE strength and improved balance for decreased falls risk.    Baseline  12/13/18: 17.52 sec;    Time  6    Period  Weeks    Status  New    Target Date  01/24/19        PT Long Term Goals - 12/13/18 1743      PT LONG TERM GOAL #1   Title  Patient will increase 10 meter walk test to >1.14m/s as to improve gait speed for better community ambulation and to reduce fall risk.    Baseline  12/13/18: 0.64 m/s with rollator, 0.72 m/s without AD;    Time  12    Period  Weeks    Status  New    Target Date  03/07/19      PT LONG TERM GOAL #2   Title  Patient will increase six minute walk test distance to >1000 for progression to community ambulator and improve gait ability.    Baseline  12/13/18: 610 ft with rollator;    Time  12    Period  Weeks    Status  New    Target Date  03/07/19      PT LONG TERM GOAL #3   Title  Patient will be require no assist with ascend/descend 3 steps using Least restrictive assistive device without LOB to improve ability to get in/out of home.    Baseline  Dependent on rail assist to reach front door.    Time  12    Period  Weeks    Status  New    Target Date  03/07/19      PT LONG TERM GOAL #4   Title  Patient will increase BLE gross strength to 4+/5 as to improve functional strength for independent gait, increased standing tolerance and increased ADL ability.    Baseline  12/13/18: RLE hip weakness;    Time  12    Period  Weeks    Status  New     Target Date  03/07/19            Plan - 12/26/18 1356    Clinical Impression Statement  Patient instructed in beginning balance and coordination exercise. Patient required mod VCs and min A  to improve weight shift and postural control with balance challenges.  Patient requires min VCs to improve stability with balance challenges..  Patients would benefit from additional skilled PT intervention to improve balance/gait safety and reduce fall risk.   Personal Factors and Comorbidities  Age;Comorbidity 2;Fitness;Time since onset of injury/illness/exacerbation    Comorbidities  HTN, Hx of stroke    Examination-Activity Limitations  Bathing;Bed Mobility;Bend;Carry;Dressing;Lift;Locomotion Level;Reach Overhead;Squat;Stairs;Stand;Transfers    Examination-Participation Restrictions  Cleaning;Community Activity;Driving;Laundry;Shop;Yard Work    Stability/Clinical Decision Making  Stable/Uncomplicated    Rehab Potential  Good    PT Frequency  2x / week    PT Duration  12 weeks    PT Treatment/Interventions  ADLs/Self Care Home Management;Aquatic Therapy;Cryotherapy;Electrical Stimulation;Moist Heat;DME Instruction;Gait training;Stair training;Functional mobility training;Therapeutic activities;Therapeutic exercise;Balance training;Neuromuscular re-education;Patient/family education;Orthotic Fit/Training;Manual techniques;Compression bandaging;Passive range of motion;Dry needling;Energy conservation;Taping    PT Next Visit Plan  review HEP, balance    PT Home Exercise Plan  Not developed yet.    Consulted and Agree with Plan of Care  Patient       Patient will benefit from skilled therapeutic intervention in order to improve the following deficits and impairments:  Abnormal gait, Decreased balance, Decreased endurance, Decreased mobility, Difficulty walking, Hypomobility, Decreased range of motion, Improper body mechanics, Decreased activity tolerance, Decreased strength, Impaired UE functional use,  Postural dysfunction  Visit Diagnosis: History of stroke  Muscle weakness (generalized)  Other lack of coordination  Localized edema  Hemiplegia and hemiparesis following cerebral infarction affecting right dominant side (HCC)  Unsteadiness on feet  Other abnormalities of gait and mobility     Problem List Patient Active Problem List   Diagnosis Date Noted  . Mixed hyperlipidemia 09/28/2018  . Middle cerebral artery embolism, left 04/18/2018  . History of stroke 04/17/2018  . IFG (impaired fasting glucose) 03/28/2018  . Essential hypertension, benign 01/15/2017  . Asthma 01/15/2017    Ezekiel InaMansfield, Nissan Frazzini S , PT DPT 12/26/2018, 2:56 PM  [facility]IN [facility] [address] [place], [place], [address] Phone: [phone]   Fax:  [phone]  Name: Kristin Solis MRN: [id number] Date of Birth: Jun 21, 1955

## 2018-12-28 ENCOUNTER — Ambulatory Visit: Payer: Medicaid Other | Admitting: Physical Therapy

## 2018-12-28 ENCOUNTER — Other Ambulatory Visit: Payer: Self-pay

## 2018-12-28 ENCOUNTER — Encounter: Payer: Self-pay | Admitting: Physical Therapy

## 2018-12-28 ENCOUNTER — Ambulatory Visit: Payer: Medicaid Other | Admitting: Occupational Therapy

## 2018-12-28 DIAGNOSIS — R2681 Unsteadiness on feet: Secondary | ICD-10-CM | POA: Diagnosis not present

## 2018-12-28 DIAGNOSIS — M6281 Muscle weakness (generalized): Secondary | ICD-10-CM | POA: Diagnosis not present

## 2018-12-28 DIAGNOSIS — R2689 Other abnormalities of gait and mobility: Secondary | ICD-10-CM | POA: Diagnosis not present

## 2018-12-28 DIAGNOSIS — R278 Other lack of coordination: Secondary | ICD-10-CM | POA: Diagnosis not present

## 2018-12-28 DIAGNOSIS — I69351 Hemiplegia and hemiparesis following cerebral infarction affecting right dominant side: Secondary | ICD-10-CM

## 2018-12-28 DIAGNOSIS — Z8673 Personal history of transient ischemic attack (TIA), and cerebral infarction without residual deficits: Secondary | ICD-10-CM | POA: Diagnosis not present

## 2018-12-28 DIAGNOSIS — R600 Localized edema: Secondary | ICD-10-CM | POA: Diagnosis not present

## 2018-12-28 NOTE — Therapy (Signed)
[place] MAIN [facility] SERVICES [address] [place], [place], [address] Phone: [phone]   Fax:  [phone]  Physical Therapy Treatment  Patient Details  Name: Kristin Solis MRN: [id number] Date of Birth: 10/28/1955 Referring Provider (PT): [name]   Encounter Date: 12/28/2018  PT End of Session - 12/28/18 1350    Visit Number  4    Number of Visits  25    Date for PT Re-Evaluation  03/07/19    Authorization Type  eval 10/27    Authorization Time Period  approved 3 visist 11/4-11/13    Authorization - Visit Number  3    Authorization - Number of Visits  3    PT Start Time  9021    PT Stop Time  1430    PT Time Calculation (min)  43 min    Equipment Utilized During Treatment  Gait belt    Activity Tolerance  Patient tolerated treatment well;Patient limited by fatigue    Behavior During Therapy  WFL for tasks assessed/performed       Past Medical History:  Diagnosis Date  . Asthma   . Hypertension   . Stroke [facility])     Past Surgical History:  Procedure Laterality Date  . ABDOMINAL HYSTERECTOMY  2005   total  . IR CT HEAD LTD  04/18/2018  . IR INTRAVSC STENT CERV CAROTID W/O EMB-PROT MOD SED INC ANGIO  04/18/2018  . IR PERCUTANEOUS ART THROMBECTOMY/INFUSION INTRACRANIAL INC DIAG ANGIO  04/18/2018  . OOPHORECTOMY    . RADIOLOGY WITH ANESTHESIA N/A 04/17/2018   Procedure: RADIOLOGY WITH ANESTHESIA;  Surgeon: Luanne Bras, MD;  Location: [place];  Service: Radiology;  Laterality: N/A;    There were no vitals filed for this visit.  Subjective Assessment - 12/28/18 1345    Subjective  Patient reports doing pretty good. Denies any soreness. reports adherence with HEP; She reports walking at home without AD but will use rollator when walking outside of home.    Patient is accompained by:  Family member    Pertinent History  Patient is a 63 y.o. female with history of stroke receiving care previously from [facility] She was initially admitted to ED on 04/17/18 with dysphagia and right sided weakness s/p left MCA CVA, extubated on 04/19/18 and discharged on 04/23/18 to private residence. Was independent with ADLs/IADLs prior to admission. Inpatient PT reported decreased safety awareness and awareness of deficits, with deficits in the areas of balance, ambulation ability, transfers. Was able to ambulate 125 feet in hospital with min guard without AD, and independent with bed mobility.    Limitations  Lifting;Standing;Walking;Writing;House hold activities    How long can you sit comfortably?  As long as desired    How long can you stand comfortably?  5 minutes    How long can you walk comfortably?  Able to grocery shop, requires a seated rest break for a couple of minutes.    Patient Stated Goals  Wants to be able to use right arm and hand, and to feel more confortable with going shopping with her husband.    Currently in Pain?  No/denies    Multiple Pain Sites  No         [facility] PT Assessment - 12/28/18 0001      Strength   Right Hip Extension  2+/5    Right Hip ABduction  3-/5    Right Hip ADduction  3-/5  Left Hip Extension  3+/5    Left Hip ABduction  4/5    Left Hip ADduction  3+/5    Right Knee Flexion  4-/5    Right Knee Extension  4/5    Left Knee Flexion  5/5    Left Knee Extension  5/5      Functional Gait  Assessment   Gait Level Surface  Walks 20 ft in less than 7 sec but greater than 5.5 sec, uses assistive device, slower speed, mild gait deviations, or deviates 6-10 in outside of the 12 in walkway width.    Change in Gait Speed  Able to change speed, demonstrates mild gait deviations, deviates 6-10 in outside of the 12 in walkway width, or no gait deviations, unable to achieve a major change in velocity, or uses a change in velocity, or uses an assistive device.    Gait with Horizontal Head Turns  Performs head turns with moderate changes in gait velocity, slows down, deviates  10-15 in outside 12 in walkway width but recovers, can continue to walk.    Gait with Vertical Head Turns  Performs task with slight change in gait velocity (eg, minor disruption to smooth gait path), deviates 6 - 10 in outside 12 in walkway width or uses assistive device    Gait and Pivot Turn  Pivot turns safely in greater than 3 sec and stops with no loss of balance, or pivot turns safely within 3 sec and stops with mild imbalance, requires small steps to catch balance.    Step Over Obstacle  Is able to step over one shoe box (4.5 in total height) but must slow down and adjust steps to clear box safely. May require verbal cueing.    Gait with Narrow Base of Support  Ambulates less than 4 steps heel to toe or cannot perform without assistance.    Gait with Eyes Closed  Walks 20 ft, slow speed, abnormal gait pattern, evidence for imbalance, deviates 10-15 in outside 12 in walkway width. Requires more than 9 sec to ambulate 20 ft.    Ambulating Backwards  Walks 20 ft, slow speed, abnormal gait pattern, evidence for imbalance, deviates 10-15 in outside 12 in walkway width.    Steps  Two feet to a stair, must use rail.    Total Score  13    FGA comment:  <19 indicates high fall risk       TREATMENT: Leg press: BLE plate 105# 6R44 with cues for proper LE positioning for better strength and motor control; BLE heel raises 105# 2x15 with min VCs for sequencing to isolate calf strength;    PT assessed outcome measures to update goals, see above/below:     OUTCOME MEASURES: TEST Outcome (12/13/18)  Outcome (12/28/18) Interpretation  5 times sit<>stand 17.52 sec   13.53 sec without HHA >60 yo, >15 sec indicates increased risk for falls  10 meter walk test  0.64 m/s with rollator; 0.72 m/s without AD   0.84 m/s without AD <1.0 m/s indicates increased risk for falls; limited community ambulator  6 minute walk test       610  Feet with rollator    Deferred 1000 feet is community ambulator      NMR: Instructed patient in functional gait assessment, see above Gait in hallway: Backward walking x50 feet x2 laps with cues for forward weight shift to improve step length and balance with backwards walking; Required min A for safety exhibiting slower gait speed Side  stepping unsupported x25 feet x  1lap each direction with CGA for safety and cues to improve foot placement for less hip ER to improve abductor strength and avoid compensation;    Patient negotiates 4 steps with 1 rail assist, forward non-reciprocal, using RLE to improve RLE strength; She does require increased time due to LE weakness/fatigue;           PT Education - 12/28/18 1431    Education Details  Progress towards goals, HEP    Person(s) Educated  Patient    Methods  Explanation;Verbal cues    Comprehension  Verbalized understanding;Verbal cues required;Need further instruction       PT Short Term Goals - 12/28/18 1352      PT SHORT TERM GOAL #1   Title  Patient will be independent in initial home exercise program to improve strength/mobility for better functional independence with ADLs.    Baseline  Not made yet., 12/28/18: given a HEP but still needs cues for proper technique    Time  6    Period  Weeks    Status  Partially Met    Target Date  01/24/19      PT SHORT TERM GOAL #2   Title  Patient (> 77 years old) will complete five times sit to stand test in < 15 seconds indicating an increased LE strength and improved balance for decreased falls risk.    Baseline  12/13/18: 17.52 sec; 12/28/18: 13.53 sec without HHA    Time  6    Period  Weeks    Status  Achieved    Target Date  01/24/19        PT Long Term Goals - 12/28/18 1353      PT LONG TERM GOAL #1   Title  Patient will increase 10 meter walk test to >1.67ms as to improve gait speed for better community ambulation and to reduce fall risk.    Baseline  12/13/18: 0.64 m/s with rollator, 0.72 m/s without AD; 12/28/18: 0.84 m/s  without AD    Time  12    Period  Weeks    Status  New    Target Date  03/06/18      PT LONG TERM GOAL #2   Title  Patient will increase six minute walk test distance to >1000 for progression to community ambulator and improve gait ability.    Baseline  12/13/18: 659ft with rollator; 12/28/18: deferred    Time  12    Period  Weeks    Status  On-going    Target Date  03/06/18      PT LONG TERM GOAL #3   Title  Patient will be require no assist with ascend/descend 3 steps using Least restrictive assistive device without LOB to improve ability to get in/out of home.    Baseline  Dependent on rail assist to reach front door. 12/28/18: uses rail, one step at a time    Time  12    Period  Weeks    Status  Partially Met    Target Date  03/06/18      PT LONG TERM GOAL #4   Title  Patient will increase BLE gross strength to 4+/5 as to improve functional strength for independent gait, increased standing tolerance and increased ADL ability.    Baseline  12/13/18: RLE hip weakness; 12/28/18: RLE hip extension: 2+/5, hip abduction/adduction 3-/5, knee grossly 4-/5    Time  12    Period  Weeks  Status  Partially Met    Target Date  03/06/18      PT LONG TERM GOAL #5   Title  Patient will increase Functional Gait Assessment score to >20/30 as to reduce fall risk and improve dynamic gait safety with community ambulation.    Baseline  12/28/18: 13/24    Time  12    Period  Weeks    Status  New    Target Date  03/06/18            Plan - 12/28/18 1431    Clinical Impression Statement  Patient motivated and participated well within session. She was instructed in advanced LE strengthening exercise. Patient does require min VCs for proper positioning and exercise technique for optimal muscle activation and strengthening. Re-assessed LE strength to update goals. She continues to have weakness in RLE particularly in posterior hip musculature. In addition, patient does exhibit improved  transfer and gait ability, although is not walking at a community ambulator speed. She did test as a high fall risk as evidenced by functional gait assessment. Patient would benefit from additional skilled PT intervention to improve strength, balance and gait safety; She has been able to walk short distances without her AD but does use rollator when walking outside home for safety;    Personal Factors and Comorbidities  Age;Comorbidity 2;Fitness;Time since onset of injury/illness/exacerbation    Comorbidities  HTN, Hx of stroke    Examination-Activity Limitations  Bathing;Bed Mobility;Bend;Carry;Dressing;Lift;Locomotion Level;Reach Overhead;Squat;Stairs;Stand;Transfers    Examination-Participation Restrictions  Cleaning;Community Activity;Driving;Laundry;Shop;Yard Work    Stability/Clinical Decision Making  Stable/Uncomplicated    Rehab Potential  Good    PT Frequency  2x / week    PT Duration  12 weeks    PT Treatment/Interventions  ADLs/Self Care Home Management;Aquatic Therapy;Cryotherapy;Electrical Stimulation;Moist Heat;DME Instruction;Gait training;Stair training;Functional mobility training;Therapeutic activities;Therapeutic exercise;Balance training;Neuromuscular re-education;Patient/family education;Orthotic Fit/Training;Manual techniques;Compression bandaging;Passive range of motion;Dry needling;Energy conservation;Taping    PT Next Visit Plan  review HEP, balance    PT Home Exercise Plan  Not developed yet.    Consulted and Agree with Plan of Care  Patient       Patient will benefit from skilled therapeutic intervention in order to improve the following deficits and impairments:  Abnormal gait, Decreased balance, Decreased endurance, Decreased mobility, Difficulty walking, Hypomobility, Decreased range of motion, Improper body mechanics, Decreased activity tolerance, Decreased strength, Impaired UE functional use, Postural dysfunction  Visit Diagnosis: Muscle weakness  (generalized)  Unsteadiness on feet  Other abnormalities of gait and mobility     Problem List Patient Active Problem List   Diagnosis Date Noted  . Mixed hyperlipidemia 09/28/2018  . Middle cerebral artery embolism, left 04/18/2018  . History of stroke 04/17/2018  . IFG (impaired fasting glucose) 03/28/2018  . Essential hypertension, benign 01/15/2017  . Asthma 01/15/2017    Trotter,Margaret PT, DPT 12/28/2018, 2:41 PM  [place] MAIN [facility] SERVICES [address] [place], [place], [address] Phone: [phone]   Fax:  [phone]  Name: Kristin Solis MRN: [id number] Date of Birth: 1955/04/20

## 2019-01-01 ENCOUNTER — Encounter: Payer: Self-pay | Admitting: Occupational Therapy

## 2019-01-01 NOTE — Therapy (Signed)
[place] [facility] MAIN [facility] SERVICES [address] [place], [place], [address] Phone: [phone]   Fax:  [phone]  Occupational Therapy Treatment  Patient Details  Name: Kristin Solis MRN: [id number] Date of Birth: 11-12-1955 Referring Provider (OT): Aura Dials   Encounter Date: 12/28/2018  OT End of Session - 01/01/19 1523    Visit Number  2    Number of Visits  13    Date for OT Re-Evaluation  02/03/19    OT Start Time  1258    OT Stop Time  1345    OT Time Calculation (min)  47 min    Activity Tolerance  Patient tolerated treatment well    Behavior During Therapy  [facility] for tasks assessed/performed       Past Medical History:  Diagnosis Date  . Asthma   . Hypertension   . Stroke [facility])     Past Surgical History:  Procedure Laterality Date  . ABDOMINAL HYSTERECTOMY  2005   total  . IR CT HEAD LTD  04/18/2018  . IR INTRAVSC STENT CERV CAROTID W/O EMB-PROT MOD SED INC ANGIO  04/18/2018  . IR PERCUTANEOUS ART THROMBECTOMY/INFUSION INTRACRANIAL INC DIAG ANGIO  04/18/2018  . OOPHORECTOMY    . RADIOLOGY WITH ANESTHESIA N/A 04/17/2018   Procedure: RADIOLOGY WITH ANESTHESIA;  Surgeon: Julieanne Cotton, MD;  Location: [facility] OR;  Service: Radiology;  Laterality: N/A;    There were no vitals filed for this visit.  Subjective Assessment - 01/01/19 1521    Subjective   Patient reports she would love to be able to reduce the swelling in her arm.    Pertinent History  Patient is a 63 y.o. female with history of stroke,  admitted to ED on 04/17/18 with dysphagia and right sided weakness s/p left MCA CVA, extubated on 04/19/18 and discharged on 04/23/18 to private residence    Patient Stated Goals  Patient reports she would like to get her right hand to move more and be able to use for daily tasks.    Currently in Pain?  No/denies    Multiple Pain Sites  No        Contrast:  Patient seen for contrast with warm 3 mins, cold 1 min for 3 cycles to  decrease edema and prepare for ROM.  Patient issued and instructed on written program for use of contrast at home 1-2 times a day to decrease edema.  Patient demonstrates understanding.  Manual therapy: Following contrast, patient seen for retrograde massage as well as joint mobs to right hand, wrist and forearm with carpal spreads and manual techniques to further decrease edema.    Therapeutic exercise: Following manual skills, patient seen for PROM to right hand, digits, and wrist followed by attempts with active movements in available ranges.    Neuromuscular reeducation: Patient seen for manipulation of ball pegs with right hand with use of tip to tip prehension pinch as well at a lateral pinch to pick up and place into grid, moderate difficulty with turning and manipulation skills and requires cues and therapist demonstration.  Patient's performance improved with removing pegs from board which requires decreased manipulation skills but encourages grasping and prehension patterns.   Response to tx: Patient continues to demonstrate moderate edema in right hand and digits which limits her ability to functionally use right hand.  Patient instructed on home program to impact edema and improve ROM working towards improved grasping and manipulation techniques.  Continue to work towards goals in  plan of care, may measure right hand circumferentially next session to be able to monitor edema.                       OT Education - 01/01/19 1522    Education Details  contrast bath, ROM, HEP    Person(s) Educated  Patient    Methods  Explanation;Demonstration    Comprehension  Verbalized understanding;Returned demonstration          OT Long Term Goals - 12/22/18 1841      OT LONG TERM GOAL #1   Title  Patient will complete home exercise program with modified independence.    Baseline  no current program    Time  6    Period  Weeks    Status  New    Target Date  02/03/19       OT LONG TERM GOAL #2   Title  Patient will demonstrate understanding of Edema control measures for her right upper extremity and implement into daily routine.    Baseline  has not been educated on edema control    Time  3    Period  Weeks    Status  New    Target Date  01/12/19      OT LONG TERM GOAL #3   Title  Patient will demonstrate improved grip by 5 # in right Hand to be able to hold items in hand with gross grasp.    Baseline  2# on right, unable to grasp items    Time  6    Period  Weeks    Status  New    Target Date  02/03/19      OT LONG TERM GOAL #4   Title  Patient will increase range of motion of fingers in right hand to be able to grasp built-up utensils for self feeding with modified independence.    Baseline  unable to feed self or hold utensils with right hand    Time  6    Period  Months    Status  New    Target Date  02/03/19      OT LONG TERM GOAL #5   Title  Patient will improve right hand function to be able to grasp pen to sign name on important papers with fair legibility.    Baseline  unable to hold pen or sign name    Time  6    Period  Weeks    Status  New    Target Date  02/03/19      Long Term Additional Goals   Additional Long Term Goals  Yes      OT LONG TERM GOAL #6   Title  Patient will hold a cup with modified independence.    Baseline  unable at eval with right hand    Time  6    Period  Weeks    Status  New    Target Date  02/03/19      OT LONG TERM GOAL #7   Title  Patient will improve right hand function to manipulate buttons, snaps, zippers with modified independence.    Baseline  difficulty at eval and requires assist at times.    Time  6    Period  Weeks    Status  New    Target Date  02/03/19            Plan - 01/01/19 1523    Clinical Impression  Statement  Patient continues to demonstrate moderate edema in right hand and digits which limits her ability to functionally use right hand.  Patient instructed on home  program to impact edema and improve ROM working towards improved grasping and manipulation techniques.  Continue to work towards goals in plan of care, may measure right hand circumferentially next session to be able to monitor edema.    OT Occupational Profile and History  Detailed Assessment- Review of Records and additional review of physical, cognitive, psychosocial history related to current functional performance    Occupational performance deficits (Please refer to evaluation for details):  ADL's;IADL's;Work;Leisure    Body Structure / Function / Physical Skills  ADL;Coordination;GMC;UE functional use;Balance;Decreased knowledge of use of DME;Flexibility;IADL;Dexterity;FMC;Proprioception;Strength;Edema;ROM    Cognitive Skills  Memory    Psychosocial Skills  Environmental  Adaptations;Habits;Routines and Behaviors    Rehab Potential  Good    Clinical Decision Making  Limited treatment options, no task modification necessary    Comorbidities Affecting Occupational Performance:  May have comorbidities impacting occupational performance    Modification or Assistance to Complete Evaluation   No modification of tasks or assist necessary to complete eval    OT Frequency  2x / week    OT Treatment/Interventions  Self-care/ADL training;Cryotherapy;Therapeutic exercise;DME and/or AE instruction;Functional Mobility Training;Cognitive remediation/compensation;Balance training;Electrical Stimulation;Neuromuscular education;Manual Therapy;Splinting;Moist Heat;Contrast Bath;Therapeutic activities;Patient/family education;Passive range of motion    Consulted and Agree with Plan of Care  Patient       Patient will benefit from skilled therapeutic intervention in order to improve the following deficits and impairments:   Body Structure / Function / Physical Skills: ADL, Coordination, GMC, UE functional use, Balance, Decreased knowledge of use of DME, Flexibility, IADL, Dexterity, FMC, Proprioception, Strength,  Edema, ROM Cognitive Skills: Memory Psychosocial Skills: Environmental  Adaptations, Habits, Routines and Behaviors   Visit Diagnosis: Muscle weakness (generalized)  Other lack of coordination  Localized edema  Hemiplegia and hemiparesis following cerebral infarction affecting right dominant side [facility])    Problem List Patient Active Problem List   Diagnosis Date Noted  . Mixed hyperlipidemia 09/28/2018  . Middle cerebral artery embolism, left 04/18/2018  . History of stroke 04/17/2018  . IFG (impaired fasting glucose) 03/28/2018  . Essential hypertension, benign 01/15/2017  . Asthma 01/15/2017   Gwendola Hornaday T Tomasita Morrow, OTR/L, CLT  Susi Goslin 01/01/2019, 3:52 PM  [place] MAIN [facility] SERVICES [address] [place], [place], [address] Phone: [phone]   Fax:  [phone]  Name: Kristin Solis MRN: [id number] Date of Birth: [birth year]

## 2019-01-02 ENCOUNTER — Other Ambulatory Visit: Payer: Self-pay

## 2019-01-02 ENCOUNTER — Ambulatory Visit: Payer: Medicaid Other | Admitting: Physical Therapy

## 2019-01-02 ENCOUNTER — Encounter: Payer: Self-pay | Admitting: Occupational Therapy

## 2019-01-02 ENCOUNTER — Ambulatory Visit: Payer: Medicaid Other | Admitting: Occupational Therapy

## 2019-01-02 ENCOUNTER — Encounter: Payer: Self-pay | Admitting: Physical Therapy

## 2019-01-02 DIAGNOSIS — Z8673 Personal history of transient ischemic attack (TIA), and cerebral infarction without residual deficits: Secondary | ICD-10-CM | POA: Diagnosis not present

## 2019-01-02 DIAGNOSIS — I69351 Hemiplegia and hemiparesis following cerebral infarction affecting right dominant side: Secondary | ICD-10-CM

## 2019-01-02 DIAGNOSIS — R2681 Unsteadiness on feet: Secondary | ICD-10-CM | POA: Diagnosis not present

## 2019-01-02 DIAGNOSIS — R2689 Other abnormalities of gait and mobility: Secondary | ICD-10-CM | POA: Diagnosis not present

## 2019-01-02 DIAGNOSIS — M6281 Muscle weakness (generalized): Secondary | ICD-10-CM | POA: Diagnosis not present

## 2019-01-02 DIAGNOSIS — R600 Localized edema: Secondary | ICD-10-CM | POA: Diagnosis not present

## 2019-01-02 DIAGNOSIS — R278 Other lack of coordination: Secondary | ICD-10-CM | POA: Diagnosis not present

## 2019-01-02 NOTE — Therapy (Addendum)
[place] [facility] [address] MAIN [facility] SERVICES [address] [place], [place], [address] Phone: [phone]   Fax:  [phone]  Occupational Therapy Treatment  Patient Details  Name: Kristin Solis MRN: [id number] Date of Birth: 1956/02/01 Referring Provider (OT): Aura Dials   Encounter Date: 01/02/2019  OT End of Session - 01/06/19 1641    Visit Number  3    Number of Visits  13    Date for OT Re-Evaluation  02/03/19    OT Start Time  1300    OT Stop Time  1345    OT Time Calculation (min)  45 min    Activity Tolerance  Patient tolerated treatment well    Behavior During Therapy  [facility] for tasks assessed/performed       Past Medical History:  Diagnosis Date  . Asthma   . Hypertension   . Stroke [facility])     Past Surgical History:  Procedure Laterality Date  . ABDOMINAL HYSTERECTOMY  2005   total  . IR CT HEAD LTD  04/18/2018  . IR INTRAVSC STENT CERV CAROTID W/O EMB-PROT MOD SED INC ANGIO  04/18/2018  . IR PERCUTANEOUS ART THROMBECTOMY/INFUSION INTRACRANIAL INC DIAG ANGIO  04/18/2018  . OOPHORECTOMY    . RADIOLOGY WITH ANESTHESIA N/A 04/17/2018   Procedure: RADIOLOGY WITH ANESTHESIA;  Surgeon: Julieanne Cotton, MD;  Location: [facility] OR;  Service: Radiology;  Laterality: N/A;    There were no vitals filed for this visit.  Subjective Assessment - 01/06/19 1640    Subjective   Patient reports she has seen a difference in the edema in her right hand over the last week.  She went to the store and got some marbles to try to pick up and manipulate after she has completed the contrast bath.    Pertinent History  Patient is a 63 y.o. female with history of stroke,  admitted to ED on 04/17/18 with dysphagia and right sided weakness s/p left MCA CVA, extubated on 04/19/18 and discharged on 04/23/18 to private residence    Patient Stated Goals  Patient reports she would like to get her right hand to move more and be able to use for daily tasks.    Currently in Pain?   No/denies    Multiple Pain Sites  No        Contrast:  Patient seen for contrast with warm 3 mins, cold 1 min for 3 cycles to decrease edema and prepare for ROM.   Manual therapy: Following contrast, patient seen for retrograde massage as well as joint mobs to right hand, wrist and forearm with carpal spreads and manual techniques to further decrease edema.    Therapeutic exercise: Following manual skills, patient seen for PROM to right hand, digits, and wrist followed by attempts with active movements in available ranges.  Able to achieve middle finger, ring and small finger to palm passively followed by 1/2 range actively.  Index finger almost to palm passively.    Neuromuscular reeducation: Patient seen for manipulation of 1 inch velcro blocks to pick up and place onto grid and remove with right UE, guiding and cues at time from therapist.  Cues for prehension patterns for tip to tip grasp, then switched to lateral pinch skill.    Addended to add circumferential measurements: Right hand Index 7.2 cm Thumb 7.5 MCP 19.2 Dorsum of hand 20.5 Wrist 18.8 10cm from ulnar styloid 25.5 15 cm from ulnar styloid 28.2  Left hand Index 6.3 cm Thumb 6.4 MCP  17.4 Dorsum of hand 19.3 Wrist 16.0 10cm from ulnar styloid 25.5 15 cm from ulnar styloid 26.5    Response to tx: Patient continues to demonstrate moderate edema in right hand and wrist area, some improvement noted since last session visually but took measurements this date to be able to track circumferentially changes going forwards.  She is continuing to wear a compression isotoner glove as well during the day.  Patient able to demonstrate improved ROM both passively and actively in right UE.                   OT Education - 01/06/19 1641    Education Details  edema control measures, ROM, grasping.    Person(s) Educated  Patient    Methods  Explanation;Demonstration    Comprehension  Verbalized  understanding;Returned demonstration          OT Long Term Goals - 12/22/18 1841      OT LONG TERM GOAL #1   Title  Patient will complete home exercise program with modified independence.    Baseline  no current program    Time  6    Period  Weeks    Status  New    Target Date  02/03/19      OT LONG TERM GOAL #2   Title  Patient will demonstrate understanding of Edema control measures for her right upper extremity and implement into daily routine.    Baseline  has not been educated on edema control    Time  3    Period  Weeks    Status  New    Target Date  01/12/19      OT LONG TERM GOAL #3   Title  Patient will demonstrate improved grip by 5 # in right Hand to be able to hold items in hand with gross grasp.    Baseline  2# on right, unable to grasp items    Time  6    Period  Weeks    Status  New    Target Date  02/03/19      OT LONG TERM GOAL #4   Title  Patient will increase range of motion of fingers in right hand to be able to grasp built-up utensils for self feeding with modified independence.    Baseline  unable to feed self or hold utensils with right hand    Time  6    Period  Months    Status  New    Target Date  02/03/19      OT LONG TERM GOAL #5   Title  Patient will improve right hand function to be able to grasp pen to sign name on important papers with fair legibility.    Baseline  unable to hold pen or sign name    Time  6    Period  Weeks    Status  New    Target Date  02/03/19      Long Term Additional Goals   Additional Long Term Goals  Yes      OT LONG TERM GOAL #6   Title  Patient will hold a cup with modified independence.    Baseline  unable at eval with right hand    Time  6    Period  Weeks    Status  New    Target Date  02/03/19      OT LONG TERM GOAL #7   Title  Patient will improve right hand  function to manipulate buttons, snaps, zippers with modified independence.    Baseline  difficulty at eval and requires assist at times.     Time  6    Period  Weeks    Status  New    Target Date  02/03/19            Plan - 01/06/19 1641    Clinical Impression Statement  Patient continues to demonstrate moderate edema in right hand and wrist area, some improvement noted since last session visually but took measurements this date to be able to track circumferentially changes going forwards.  She is continuing to wear a compression isotoner glove as well during the day.  Patient able to demonstrate improved ROM both passively and actively in right UE.    OT Occupational Profile and History  Detailed Assessment- Review of Records and additional review of physical, cognitive, psychosocial history related to current functional performance    Occupational performance deficits (Please refer to evaluation for details):  ADL's;IADL's;Work;Leisure    Body Structure / Function / Physical Skills  ADL;Coordination;GMC;UE functional use;Balance;Decreased knowledge of use of DME;Flexibility;IADL;Dexterity;FMC;Proprioception;Strength;Edema;ROM    Cognitive Skills  Memory    Psychosocial Skills  Environmental  Adaptations;Habits;Routines and Behaviors    Rehab Potential  Good    Clinical Decision Making  Limited treatment options, no task modification necessary    Comorbidities Affecting Occupational Performance:  May have comorbidities impacting occupational performance    Modification or Assistance to Complete Evaluation   No modification of tasks or assist necessary to complete eval    OT Frequency  2x / week    OT Duration  6 weeks    OT Treatment/Interventions  Self-care/ADL training;Cryotherapy;Therapeutic exercise;DME and/or AE instruction;Functional Mobility Training;Cognitive remediation/compensation;Balance training;Electrical Stimulation;Neuromuscular education;Manual Therapy;Splinting;Moist Heat;Contrast Bath;Therapeutic activities;Patient/family education;Passive range of motion    Consulted and Agree with Plan of Care  Patient        Patient will benefit from skilled therapeutic intervention in order to improve the following deficits and impairments:   Body Structure / Function / Physical Skills: ADL, Coordination, GMC, UE functional use, Balance, Decreased knowledge of use of DME, Flexibility, IADL, Dexterity, FMC, Proprioception, Strength, Edema, ROM Cognitive Skills: Memory Psychosocial Skills: Environmental  Adaptations, Habits, Routines and Behaviors   Visit Diagnosis: Muscle weakness (generalized)  Unsteadiness on feet  Other lack of coordination  Hemiplegia and hemiparesis following cerebral infarction affecting right dominant side [facility])    Problem List Patient Active Problem List   Diagnosis Date Noted  . Mixed hyperlipidemia 09/28/2018  . Middle cerebral artery embolism, left 04/18/2018  . History of stroke 04/17/2018  . IFG (impaired fasting glucose) 03/28/2018  . Essential hypertension, benign 01/15/2017  . Asthma 01/15/2017   Reylynn Vanalstine T Tomasita Morrow, OTR/L, CLT  Yomara Toothman 01/06/2019, 4:53 PM  [place] MAIN [facility] SERVICES [address] [place], [place], [address] Phone: [phone]   Fax:  [phone]  Name: TRISHA KEN MRN: [id number] Date of Birth: [birth year]

## 2019-01-02 NOTE — Therapy (Signed)
[place] MAIN [facility] SERVICES [address] [place], [place], [address] Phone: [phone]   Fax:  [phone]  Physical Therapy Treatment  Patient Details  Name: Kristin Solis MRN: [id number] Date of Birth: 01/30/1956 Referring Provider (PT): [name]   Encounter Date: 01/02/2019  PT End of Session - 01/02/19 1353    Visit Number  5    Number of Visits  25    Date for PT Re-Evaluation  03/07/19    Authorization Type  eval 10/27    Authorization Time Period  approved 3 visist 11/4-11/13    Authorization - Visit Number  3    Authorization - Number of Visits  3    PT Start Time  1350    PT Stop Time  1430    PT Time Calculation (min)  40 min    Equipment Utilized During Treatment  Gait belt    Activity Tolerance  Patient tolerated treatment well;Patient limited by fatigue    Behavior During Therapy  WFL for tasks assessed/performed       Past Medical History:  Diagnosis Date  . Asthma   . Hypertension   . Stroke [facility])     Past Surgical History:  Procedure Laterality Date  . ABDOMINAL HYSTERECTOMY  2005   total  . IR CT HEAD LTD  04/18/2018  . IR INTRAVSC STENT CERV CAROTID W/O EMB-PROT MOD SED INC ANGIO  04/18/2018  . IR PERCUTANEOUS ART THROMBECTOMY/INFUSION INTRACRANIAL INC DIAG ANGIO  04/18/2018  . OOPHORECTOMY    . RADIOLOGY WITH ANESTHESIA N/A 04/17/2018   Procedure: RADIOLOGY WITH ANESTHESIA;  Surgeon: Luanne Bras, MD;  Location: [place];  Service: Radiology;  Laterality: N/A;    There were no vitals filed for this visit.  Subjective Assessment - 01/02/19 1351    Subjective  Patient reports doing pretty good. Denies any soreness. reports adherence with HEP; She reports walking at home without AD but will use rollator when walking outside of home.    Patient is accompained by:  Family member    Pertinent History  Patient is a 63 y.o. female with history of stroke [facility]. She was initially admitted to ED on 04/17/18 with dysphagia and right sided weakness s/p left MCA CVA, extubated on 04/19/18 and discharged on 04/23/18 to private residence. Was independent with ADLs/IADLs prior to admission. Inpatient PT reported decreased safety awareness and awareness of deficits, with deficits in the areas of balance, ambulation ability, transfers. Was able to ambulate 125 feet in [facility] with min guard without AD, and independent with bed mobility.    Limitations  Lifting;Standing;Walking;Writing;House hold activities    How long can you sit comfortably?  As long as desired    How long can you stand comfortably?  5 minutes    How long can you walk comfortably?  Able to grocery shop, requires a seated rest break for a couple of minutes.    Patient Stated Goals  Wants to be able to use right arm and hand, and to feel more confortable with going shopping with her husband.    Currently in Pain?  No/denies    Multiple Pain Sites  No       .   Ther-ex  Nu-step  x 5 mins  Hip flexion marches  x 10 bilateral Hip abduction  x 10 bilateral Hip extension x 10 bilateral Quantum leg press 95# x 20 x 3 sets  Sit  to stand without UE support from regular height chair x 10   Heel raises x 15 x 2 sets Leg press x 100 lbs x 20 x 3  Step ups to 6-inch stool x 20  Lateral side steps blue foam to 6 inch stool left and right x 10  Eccentric step downs from 6 inch stool to blue foam pad x 10 verbal cues to complete slow and tap heel.  BUE CGA TM is . 3 miles / hour x 2 mins , 1 min 15 seconds   Patient needs occasional verbal cueing to improve posture and cueing to correctly perform exercises slowly, holding at end of range to increase motor firing of desired muscle to encourage fatigue.  Patient has more fatigue today and needs frequent rest periods.                     PT Education - 01/02/19 1351    Education Details  HEP    Person(s) Educated  Patient     Methods  Explanation;Demonstration    Comprehension  Verbalized understanding;Returned demonstration;Need further instruction       PT Short Term Goals - 12/28/18 1352      PT SHORT TERM GOAL #1   Title  Patient will be independent in initial home exercise program to improve strength/mobility for better functional independence with ADLs.    Baseline  Not made yet., 12/28/18: given a HEP but still needs cues for proper technique    Time  6    Period  Weeks    Status  Partially Met    Target Date  01/24/19      PT SHORT TERM GOAL #2   Title  Patient (> 63 years old) will complete five times sit to stand test in < 15 seconds indicating an increased LE strength and improved balance for decreased falls risk.    Baseline  12/13/18: 17.52 sec; 12/28/18: 13.53 sec without HHA    Time  6    Period  Weeks    Status  Achieved    Target Date  01/24/19        PT Long Term Goals - 12/28/18 1353      PT LONG TERM GOAL #1   Title  Patient will increase 10 meter walk test to >1.9ms as to improve gait speed for better community ambulation and to reduce fall risk.    Baseline  12/13/18: 0.64 m/s with rollator, 0.72 m/s without AD; 12/28/18: 0.84 m/s without AD    Time  12    Period  Weeks    Status  New    Target Date  03/06/18      PT LONG TERM GOAL #2   Title  Patient will increase six minute walk test distance to >1000 for progression to community ambulator and improve gait ability.    Baseline  12/13/18: 683ft with rollator; 12/28/18: deferred    Time  12    Period  Weeks    Status  On-going    Target Date  03/06/18      PT LONG TERM GOAL #3   Title  Patient will be require no assist with ascend/descend 3 steps using Least restrictive assistive device without LOB to improve ability to get in/out of home.    Baseline  Dependent on rail assist to reach front door. 12/28/18: uses rail, one step at a time    Time  12    Period  Weeks  Status  Partially Met    Target Date  03/06/18       PT LONG TERM GOAL #4   Title  Patient will increase BLE gross strength to 4+/5 as to improve functional strength for independent gait, increased standing tolerance and increased ADL ability.    Baseline  12/13/18: RLE hip weakness; 12/28/18: RLE hip extension: 2+/5, hip abduction/adduction 3-/5, knee grossly 4-/5    Time  12    Period  Weeks    Status  Partially Met    Target Date  03/06/18      PT LONG TERM GOAL #5   Title  Patient will increase Functional Gait Assessment score to >20/30 as to reduce fall risk and improve dynamic gait safety with community ambulation.    Baseline  12/28/18: 13/24    Time  12    Period  Weeks    Status  New    Target Date  03/06/18            Plan - 01/02/19 1352    Clinical Impression Statement  Patient instructed in beginning balance and strengthening exercise. Patient required mod VCs and min A for gait to improve weight shift and postural control. Patient requires min VCs to improve ankle stability with  with gait.  Patients would benefit from additional skilled PT intervention to improve balance/gait safety and reduce fall risk.   Personal Factors and Comorbidities  Age;Comorbidity 2;Fitness;Time since onset of injury/illness/exacerbation    Comorbidities  HTN, Hx of stroke    Examination-Activity Limitations  Bathing;Bed Mobility;Bend;Carry;Dressing;Lift;Locomotion Level;Reach Overhead;Squat;Stairs;Stand;Transfers    Examination-Participation Restrictions  Cleaning;Community Activity;Driving;Laundry;Shop;Yard Work    Stability/Clinical Decision Making  Stable/Uncomplicated    Rehab Potential  Good    PT Frequency  2x / week    PT Duration  12 weeks    PT Treatment/Interventions  ADLs/Self Care Home Management;Aquatic Therapy;Cryotherapy;Electrical Stimulation;Moist Heat;DME Instruction;Gait training;Stair training;Functional mobility training;Therapeutic activities;Therapeutic exercise;Balance training;Neuromuscular  re-education;Patient/family education;Orthotic Fit/Training;Manual techniques;Compression bandaging;Passive range of motion;Dry needling;Energy conservation;Taping    PT Next Visit Plan  review HEP, balance    PT Home Exercise Plan  Not developed yet.    Consulted and Agree with Plan of Care  Patient       Patient will benefit from skilled therapeutic intervention in order to improve the following deficits and impairments:  Abnormal gait, Decreased balance, Decreased endurance, Decreased mobility, Difficulty walking, Hypomobility, Decreased range of motion, Improper body mechanics, Decreased activity tolerance, Decreased strength, Impaired UE functional use, Postural dysfunction  Visit Diagnosis: Muscle weakness (generalized)  Unsteadiness on feet  Other lack of coordination  Hemiplegia and hemiparesis following cerebral infarction affecting right dominant side (HCC)  Other abnormalities of gait and mobility     Problem List Patient Active Problem List   Diagnosis Date Noted  . Mixed hyperlipidemia 09/28/2018  . Middle cerebral artery embolism, left 04/18/2018  . History of stroke 04/17/2018  . IFG (impaired fasting glucose) 03/28/2018  . Essential hypertension, benign 01/15/2017  . Asthma 01/15/2017    Alanson Puls, PT DPT 01/02/2019, 1:56 PM  [place] MAIN [facility] SERVICES [address] [place], [place], [address] Phone: [phone]   Fax:  [phone]  Name: ELZA VARRICCHIO MRN: [id number] Date of Birth: 03-01-1955

## 2019-01-04 ENCOUNTER — Other Ambulatory Visit: Payer: Self-pay

## 2019-01-04 ENCOUNTER — Ambulatory Visit: Payer: Medicaid Other | Admitting: Occupational Therapy

## 2019-01-04 ENCOUNTER — Ambulatory Visit: Payer: Medicaid Other | Admitting: Physical Therapy

## 2019-01-04 DIAGNOSIS — R278 Other lack of coordination: Secondary | ICD-10-CM

## 2019-01-04 DIAGNOSIS — R600 Localized edema: Secondary | ICD-10-CM | POA: Diagnosis not present

## 2019-01-04 DIAGNOSIS — R2689 Other abnormalities of gait and mobility: Secondary | ICD-10-CM

## 2019-01-04 DIAGNOSIS — I69351 Hemiplegia and hemiparesis following cerebral infarction affecting right dominant side: Secondary | ICD-10-CM | POA: Diagnosis not present

## 2019-01-04 DIAGNOSIS — R2681 Unsteadiness on feet: Secondary | ICD-10-CM

## 2019-01-04 DIAGNOSIS — M6281 Muscle weakness (generalized): Secondary | ICD-10-CM | POA: Diagnosis not present

## 2019-01-04 DIAGNOSIS — Z8673 Personal history of transient ischemic attack (TIA), and cerebral infarction without residual deficits: Secondary | ICD-10-CM | POA: Diagnosis not present

## 2019-01-04 NOTE — Patient Instructions (Signed)
Access Code: [email or web address]  URL: [email or web address]  Date: 01/04/2019  Prepared by: Blanche East   Exercises  Standing Hip Abduction with Resistance at Ankles and Counter Support - 15 reps - 2 sets - 1x daily - 7x weekly  Standing Hip Extension with Resistance at Ankles and Counter Support - 15 reps - 2 sets - 1x daily - 7x weekly  Standing Hip Flexion with Anchored Resistance and Chair Support - 15 reps - 2 sets - 1x daily - 7x weekly  Side Stepping with Resistance at Ankles and Counter Support - 3-5 reps - 2 sets - 1x daily - 7x weekly

## 2019-01-04 NOTE — Therapy (Signed)
[place] MAIN [facility] SERVICES [address] [place], [place], [address] Phone: [phone]   Fax:  [phone]  Physical Therapy Treatment  Patient Details  Name: Kristin Solis MRN: [id number] Date of Birth: November 28, 1955 Referring Provider (PT): [name]   Encounter Date: 01/04/2019  PT End of Session - 01/04/19 1249    Visit Number  6    Number of Visits  25    Date for PT Re-Evaluation  03/07/19    Authorization Type  eval 10/27    Authorization Time Period  approved 3 visist 11/4-11/13, 8 visits aproved ( 01/02/19-12/13)    Authorization - Visit Number  2    Authorization - Number of Visits  8    PT Start Time  7262    PT Stop Time  1430    PT Time Calculation (min)  43 min    Equipment Utilized During Treatment  Gait belt    Activity Tolerance  Patient tolerated treatment well;Patient limited by fatigue    Behavior During Therapy  WFL for tasks assessed/performed       Past Medical History:  Diagnosis Date  . Asthma   . Hypertension   . Stroke [facility])     Past Surgical History:  Procedure Laterality Date  . ABDOMINAL HYSTERECTOMY  2005   total  . IR CT HEAD LTD  04/18/2018  . IR INTRAVSC STENT CERV CAROTID W/O EMB-PROT MOD SED INC ANGIO  04/18/2018  . IR PERCUTANEOUS ART THROMBECTOMY/INFUSION INTRACRANIAL INC DIAG ANGIO  04/18/2018  . OOPHORECTOMY    . RADIOLOGY WITH ANESTHESIA N/A 04/17/2018   Procedure: RADIOLOGY WITH ANESTHESIA;  Surgeon: Luanne Bras, MD;  Location: [place];  Service: Radiology;  Laterality: N/A;    There were no vitals filed for this visit.  Subjective Assessment - 01/04/19 1349    Subjective  Patient reports doing well; Denies any pain; Reports adherence with HEP;    Patient is accompained by:  Family member    Pertinent History  Patient is a 63 y.o. female with history of stroke receiving care previously from [facility]. She was initially admitted to ED on 04/17/18 with  dysphagia and right sided weakness s/p left MCA CVA, extubated on 04/19/18 and discharged on 04/23/18 to private residence. Was independent with ADLs/IADLs prior to admission. Inpatient PT reported decreased safety awareness and awareness of deficits, with deficits in the areas of balance, ambulation ability, transfers. Was able to ambulate 125 feet in hospital with min guard without AD, and independent with bed mobility.    Limitations  Lifting;Standing;Walking;Writing;House hold activities    How long can you sit comfortably?  As long as desired    How long can you stand comfortably?  5 minutes    How long can you walk comfortably?  Able to grocery shop, requires a seated rest break for a couple of minutes.    Patient Stated Goals  Wants to be able to use right arm and hand, and to feel more confortable with going shopping with her husband.    Currently in Pain?  No/denies    Multiple Pain Sites  No          Warm up with gait in hallway: Forward walking x200 feet with cues to increase speed to tolerance Forward walking with head turns side/side x200 feet with CGA for safety Forward walking with vertical head turns x200 feet with CGA for safety Required cues to increase step length and to  improve RLE foot clearance for less foot drag; She exhibits increased foot drag with fatigue and with head turns;   Advanced HEP: Standing with  red   band tied around BLE  -Hip abduction x15 bilaterally; -hip extension x15 bilaterally; -hip flexion SLR x15 bilaterally; -side stepping x10 feet x2 laps each direction Patient able to safely don/doff tband well. She required cues for proper exercise technique including to avoid trunk lean and keep knee straight for better hip strengthening;    Patient instructed in advanced balance exercise  Standing in parallel bars:  Standing on 1/2 foam: (Flat side up) -heel/toe rocks with feet apart heel/toe rocks x15 with rail assist for safety -feet  apart,unsupported mini squat with min A for safety x10 reps -tandem stance with 2-0 rail assist 10 sec hold x2 reps each foot in front with CGA to min A for safety and cues to improve erect posture and increase weight shift for better stance control  Standing on rockerboard: -incline calf stretch 20 sec hold x2 reps; -standing on incline unsupported standing eyes open 30 sec hold, eyes closed 30 sec hold CGA for safety; Patient able to exhibit good weight shift and stance control;  Following session patient ambulated 80 feet without AD with better reciprocal gait pattern and improved foot clearance on RLE with less foot drag and better heel/toe rock;               PT Education - 01/04/19 1249    Education Details  LE strength, balance, HEP    Person(s) Educated  Patient    Methods  Explanation;Verbal cues    Comprehension  Verbalized understanding;Returned demonstration;Verbal cues required;Need further instruction       PT Short Term Goals - 12/28/18 1352      PT SHORT TERM GOAL #1   Title  Patient will be independent in initial home exercise program to improve strength/mobility for better functional independence with ADLs.    Baseline  Not made yet., 12/28/18: given a HEP but still needs cues for proper technique    Time  6    Period  Weeks    Status  Partially Met    Target Date  01/24/19      PT SHORT TERM GOAL #2   Title  Patient (> 63 years old) will complete five times sit to stand test in < 15 seconds indicating an increased LE strength and improved balance for decreased falls risk.    Baseline  12/13/18: 17.52 sec; 12/28/18: 13.53 sec without HHA    Time  6    Period  Weeks    Status  Achieved    Target Date  01/24/19        PT Long Term Goals - 12/28/18 1353      PT LONG TERM GOAL #1   Title  Patient will increase 10 meter walk test to >1.95ms as to improve gait speed for better community ambulation and to reduce fall risk.    Baseline  12/13/18: 0.64  m/s with rollator, 0.72 m/s without AD; 12/28/18: 0.84 m/s without AD    Time  12    Period  Weeks    Status  New    Target Date  03/06/18      PT LONG TERM GOAL #2   Title  Patient will increase six minute walk test distance to >1000 for progression to community ambulator and improve gait ability.    Baseline  12/13/18: 610 ft with rollator; 12/28/18: deferred  Time  12    Period  Weeks    Status  On-going    Target Date  03/06/18      PT LONG TERM GOAL #3   Title  Patient will be require no assist with ascend/descend 3 steps using Least restrictive assistive device without LOB to improve ability to get in/out of home.    Baseline  Dependent on rail assist to reach front door. 12/28/18: uses rail, one step at a time    Time  12    Period  Weeks    Status  Partially Met    Target Date  03/06/18      PT LONG TERM GOAL #4   Title  Patient will increase BLE gross strength to 4+/5 as to improve functional strength for independent gait, increased standing tolerance and increased ADL ability.    Baseline  12/13/18: RLE hip weakness; 12/28/18: RLE hip extension: 2+/5, hip abduction/adduction 3-/5, knee grossly 4-/5    Time  12    Period  Weeks    Status  Partially Met    Target Date  03/06/18      PT LONG TERM GOAL #5   Title  Patient will increase Functional Gait Assessment score to >20/30 as to reduce fall risk and improve dynamic gait safety with community ambulation.    Baseline  12/28/18: 13/24    Time  12    Period  Weeks    Status  New    Target Date  03/06/18            Plan - 01/05/19 0756    Clinical Impression Statement  Patient motivated and participated well within session. Instructed patient in advanced LE strengthening exercise for HEP. She was able to safely don/doff tband for HEP adherence. Patient does require min VCs for proper positioning and exercise technique. She reports mild fatigue at end of session. Advanced balance exercise utilizing compliant  surfaces to challenge ankle strategies. Following session patient able to exhibit improved heel/toe gait pattern with RLE with less foot drag. She would benefit from additional skilled PT intervention to improve strength, balance and gait safety;    Personal Factors and Comorbidities  Age;Comorbidity 2;Fitness;Time since onset of injury/illness/exacerbation    Comorbidities  HTN, Hx of stroke    Examination-Activity Limitations  Bathing;Bed Mobility;Bend;Carry;Dressing;Lift;Locomotion Level;Reach Overhead;Squat;Stairs;Stand;Transfers    Examination-Participation Restrictions  Cleaning;Community Activity;Driving;Laundry;Shop;Yard Work    Stability/Clinical Decision Making  Stable/Uncomplicated    Rehab Potential  Good    PT Frequency  2x / week    PT Duration  12 weeks    PT Treatment/Interventions  ADLs/Self Care Home Management;Aquatic Therapy;Cryotherapy;Electrical Stimulation;Moist Heat;DME Instruction;Gait training;Stair training;Functional mobility training;Therapeutic activities;Therapeutic exercise;Balance training;Neuromuscular re-education;Patient/family education;Orthotic Fit/Training;Manual techniques;Compression bandaging;Passive range of motion;Dry needling;Energy conservation;Taping    PT Next Visit Plan  review HEP, balance    PT Home Exercise Plan  Not developed yet.    Consulted and Agree with Plan of Care  Patient       Patient will benefit from skilled therapeutic intervention in order to improve the following deficits and impairments:  Abnormal gait, Decreased balance, Decreased endurance, Decreased mobility, Difficulty walking, Hypomobility, Decreased range of motion, Improper body mechanics, Decreased activity tolerance, Decreased strength, Impaired UE functional use, Postural dysfunction  Visit Diagnosis: Muscle weakness (generalized)  Unsteadiness on feet  Other lack of coordination  Other abnormalities of gait and mobility     Problem List Patient Active  Problem List   Diagnosis Date Noted  . Mixed  hyperlipidemia 09/28/2018  . Middle cerebral artery embolism, left 04/18/2018  . History of stroke 04/17/2018  . IFG (impaired fasting glucose) 03/28/2018  . Essential hypertension, benign 01/15/2017  . Asthma 01/15/2017    Trotter,Margaret PT, DPT 01/05/2019, 7:59 AM  [place] MAIN [facility] SERVICES [address] [place], [place], [address] Phone: [phone]   Fax:  [phone]  Name: DASHANA GUIZAR MRN: [id number] Date of Birth: October 30, 1955

## 2019-01-05 ENCOUNTER — Encounter: Payer: Self-pay | Admitting: Physical Therapy

## 2019-01-06 ENCOUNTER — Encounter: Payer: Self-pay | Admitting: Occupational Therapy

## 2019-01-06 NOTE — Therapy (Signed)
[facility] MAIN [facility] SERVICES [address] [place], [place], [address] Phone: [phone]   Fax:  [phone]  Occupational Therapy Treatment  Patient Details  Name: Kristin Solis MRN: [id number] Date of Birth: [birth year] Referring Provider (OT): Aura Dials   Encounter Date: 01/04/2019  OT End of Session - 01/06/19 1815    Visit Number  4    Number of Visits  13    Date for OT Re-Evaluation  02/03/19    OT Start Time  1258    OT Stop Time  1344    OT Time Calculation (min)  46 min    Activity Tolerance  Patient tolerated treatment well    Behavior During Therapy  [facility] for tasks assessed/performed       Past Medical History:  Diagnosis Date  . Asthma   . Hypertension   . Stroke [facility])     Past Surgical History:  Procedure Laterality Date  . ABDOMINAL HYSTERECTOMY  2005   total  . IR CT HEAD LTD  04/18/2018  . IR INTRAVSC STENT CERV CAROTID W/O EMB-PROT MOD SED INC ANGIO  04/18/2018  . IR PERCUTANEOUS ART THROMBECTOMY/INFUSION INTRACRANIAL INC DIAG ANGIO  04/18/2018  . OOPHORECTOMY    . RADIOLOGY WITH ANESTHESIA N/A 04/17/2018   Procedure: RADIOLOGY WITH ANESTHESIA;  Surgeon: Julieanne Cotton, MD;  Location: [facility] OR;  Service: Radiology;  Laterality: N/A;    There were no vitals filed for this visit.  Subjective Assessment - 01/06/19 1814    Subjective   No pain today.  Patient has been working on opposition at home and reports she was able to move towards the small finger yesterday and was so happy.  She took pictures of herself doing it to send her pictures.    Pertinent History  Patient is a 63 y.o. female with history of stroke,  admitted to ED on 04/17/18 with dysphagia and right sided weakness s/p left MCA CVA, extubated on 04/19/18 and discharged on 04/23/18 to private residence    Patient Stated Goals  Patient reports she would like to get her right hand to move more and be able to use for daily tasks.    Currently in Pain?   No/denies    Multiple Pain Sites  No      Contrast: Patient seen for contrast with warm 3 mins, cold 1 min for 3 cycles to decrease edema and prepare for ROM.   Manual therapy: Following contrast, patient seen for retrograde massage as well as joint mobs to right hand, wrist and forearm with carpal spreads and manual techniques to further decrease edema.   Therapeutic exercise: Following manual skills, patient seen for PROM to right hand, digits, and wrist followed by attempts with active movements in available ranges.  Patient seen for focus on oppositional movements this date with right hand,  Opposition to the lateral side of the ring finger and reports she was able to get to the small finger earlier. Patient Tried to pull up zipper with her right hand today for the first time and was successful with task.   Full fisting passively and opposition to small passive with a few active movements to lateral side of the small finger.   Neuromuscular reeducation:  Patient seen for picking up [place] discs with right hand with cues and therapist demo, picking up stacking discs on top of one another able to stack multiples of 2 and 3 at a times.  At the end she was  able to stack 9 but when attempting to remove each one she knocked the stack over.  She was unable to turn or flip with discs with the right hand despite multiple attempts.  Worked towards isolated finger movements for right index finger.   Response to tx:  Patient reports she is excited and pleased with her progress, she is starting to see functional gains with small tasks at home and was able to manage her zipper today on her jacket for the first time.  She is progressing with oppositional movements as well as fisting movements with active movement.  Continue to work towards goals in plan of care to increase ROM, strength, functional use and decrease edema.                     OT Education - 01/06/19 1815     Education Details  edema control measures, ROM, grasping.    Person(s) Educated  Patient    Methods  Explanation;Demonstration    Comprehension  Verbalized understanding;Returned demonstration          OT Long Term Goals - 12/22/18 1841      OT LONG TERM GOAL #1   Title  Patient will complete home exercise program with modified independence.    Baseline  no current program    Time  6    Period  Weeks    Status  New    Target Date  02/03/19      OT LONG TERM GOAL #2   Title  Patient will demonstrate understanding of Edema control measures for her right upper extremity and implement into daily routine.    Baseline  has not been educated on edema control    Time  3    Period  Weeks    Status  New    Target Date  01/12/19      OT LONG TERM GOAL #3   Title  Patient will demonstrate improved grip by 5 # in right Hand to be able to hold items in hand with gross grasp.    Baseline  2# on right, unable to grasp items    Time  6    Period  Weeks    Status  New    Target Date  02/03/19      OT LONG TERM GOAL #4   Title  Patient will increase range of motion of fingers in right hand to be able to grasp built-up utensils for self feeding with modified independence.    Baseline  unable to feed self or hold utensils with right hand    Time  6    Period  Months    Status  New    Target Date  02/03/19      OT LONG TERM GOAL #5   Title  Patient will improve right hand function to be able to grasp pen to sign name on important papers with fair legibility.    Baseline  unable to hold pen or sign name    Time  6    Period  Weeks    Status  New    Target Date  02/03/19      Long Term Additional Goals   Additional Long Term Goals  Yes      OT LONG TERM GOAL #6   Title  Patient will hold a cup with modified independence.    Baseline  unable at eval with right hand    Time  6  Period  Weeks    Status  New    Target Date  02/03/19      OT LONG TERM GOAL #7   Title  Patient  will improve right hand function to manipulate buttons, snaps, zippers with modified independence.    Baseline  difficulty at eval and requires assist at times.    Time  6    Period  Weeks    Status  New    Target Date  02/03/19            Plan - 01/06/19 1816    Clinical Impression Statement  Patient reports she is excited and pleased with her progress, she is starting to see functional gains with small tasks at home and was able to manage her zipper today on her jacket for the first time.  She is progressing with oppositional movements as well as fisting movements with active movement.  Continue to work towards goals in plan of care to increase ROM, strength, functional use and decrease edema.    OT Occupational Profile and History  Detailed Assessment- Review of Records and additional review of physical, cognitive, psychosocial history related to current functional performance    Occupational performance deficits (Please refer to evaluation for details):  ADL's;IADL's;Work;Leisure    Body Structure / Function / Physical Skills  ADL;Coordination;GMC;UE functional use;Balance;Decreased knowledge of use of DME;Flexibility;IADL;Dexterity;FMC;Proprioception;Strength;Edema;ROM    Cognitive Skills  Memory    Psychosocial Skills  Environmental  Adaptations;Habits;Routines and Behaviors    Rehab Potential  Good    Clinical Decision Making  Limited treatment options, no task modification necessary    Comorbidities Affecting Occupational Performance:  May have comorbidities impacting occupational performance    Modification or Assistance to Complete Evaluation   No modification of tasks or assist necessary to complete eval    OT Frequency  2x / week    OT Duration  6 weeks    OT Treatment/Interventions  Self-care/ADL training;Cryotherapy;Therapeutic exercise;DME and/or AE instruction;Functional Mobility Training;Cognitive remediation/compensation;Balance training;Electrical  Stimulation;Neuromuscular education;Manual Therapy;Splinting;Moist Heat;Contrast Bath;Therapeutic activities;Patient/family education;Passive range of motion    Consulted and Agree with Plan of Care  Patient       Patient will benefit from skilled therapeutic intervention in order to improve the following deficits and impairments:   Body Structure / Function / Physical Skills: ADL, Coordination, GMC, UE functional use, Balance, Decreased knowledge of use of DME, Flexibility, IADL, Dexterity, FMC, Proprioception, Strength, Edema, ROM Cognitive Skills: Memory Psychosocial Skills: Environmental  Adaptations, Habits, Routines and Behaviors   Visit Diagnosis: Muscle weakness (generalized)  Unsteadiness on feet  Other lack of coordination  Hemiplegia and hemiparesis following cerebral infarction affecting right dominant side [facility](HCC)    Problem List Patient Active Problem List   Diagnosis Date Noted  . Mixed hyperlipidemia 09/28/2018  . Middle cerebral artery embolism, left 04/18/2018  . History of stroke 04/17/2018  . IFG (impaired fasting glucose) 03/28/2018  . Essential hypertension, benign 01/15/2017  . Asthma 01/15/2017  Amy T Arne ClevelandLovett, OTR/L, CLT  Lovett,Amy 01/06/2019, 6:26 PM  [place] [facility] [address] [place], [place], [address] Phone: [phone]   Fax:  [phone]  Name: Kristin Solis MRN: [id number] Date of Birth: 1956/01/16

## 2019-01-09 ENCOUNTER — Ambulatory Visit: Payer: Medicaid Other

## 2019-01-09 ENCOUNTER — Ambulatory Visit: Payer: Medicaid Other | Admitting: Occupational Therapy

## 2019-01-09 ENCOUNTER — Encounter: Payer: Self-pay | Admitting: Occupational Therapy

## 2019-01-09 ENCOUNTER — Other Ambulatory Visit: Payer: Self-pay

## 2019-01-09 DIAGNOSIS — M6281 Muscle weakness (generalized): Secondary | ICD-10-CM

## 2019-01-09 DIAGNOSIS — R2689 Other abnormalities of gait and mobility: Secondary | ICD-10-CM

## 2019-01-09 DIAGNOSIS — I69351 Hemiplegia and hemiparesis following cerebral infarction affecting right dominant side: Secondary | ICD-10-CM

## 2019-01-09 DIAGNOSIS — R2681 Unsteadiness on feet: Secondary | ICD-10-CM | POA: Diagnosis not present

## 2019-01-09 DIAGNOSIS — R600 Localized edema: Secondary | ICD-10-CM

## 2019-01-09 DIAGNOSIS — R278 Other lack of coordination: Secondary | ICD-10-CM

## 2019-01-09 DIAGNOSIS — Z8673 Personal history of transient ischemic attack (TIA), and cerebral infarction without residual deficits: Secondary | ICD-10-CM | POA: Diagnosis not present

## 2019-01-09 NOTE — Therapy (Signed)
[place] MAIN [facility] SERVICES [address] [place], [place], [address] Phone: [phone]   Fax:  [phone]  Physical Therapy Treatment  Patient Details  Name: Kristin Solis MRN: [id number] Date of Birth: [birth year] Referring Provider (PT): [name]   Encounter Date: 01/09/2019  PT End of Session - 01/09/19 1342    Visit Number  7    Number of Visits  25    Date for PT Re-Evaluation  03/07/19    Authorization Type  eval 10/27    Authorization Time Period  approved 3 visist 11/4-11/13, 8 visits aproved ( 01/02/19-12/13)    Authorization - Visit Number  7    Authorization - Number of Visits  8    PT Start Time  3662    PT Stop Time  1429    PT Time Calculation (min)  43 min    Equipment Utilized During Treatment  Gait belt    Activity Tolerance  Patient tolerated treatment well;Patient limited by fatigue    Behavior During Therapy  WFL for tasks assessed/performed       Past Medical History:  Diagnosis Date  . Asthma   . Hypertension   . Stroke [facility])     Past Surgical History:  Procedure Laterality Date  . ABDOMINAL HYSTERECTOMY  2005   total  . IR CT HEAD LTD  04/18/2018  . IR INTRAVSC STENT CERV CAROTID W/O EMB-PROT MOD SED INC ANGIO  04/18/2018  . IR PERCUTANEOUS ART THROMBECTOMY/INFUSION INTRACRANIAL INC DIAG ANGIO  04/18/2018  . OOPHORECTOMY    . RADIOLOGY WITH ANESTHESIA N/A 04/17/2018   Procedure: RADIOLOGY WITH ANESTHESIA;  Surgeon: Luanne Bras, MD;  Location: [place];  Service: Radiology;  Laterality: N/A;    There were no vitals filed for this visit.  Subjective Assessment - 01/09/19 1351    Subjective  Patient reports new onset of LLE weakness today, no slurring of speach, headache, or LUE changes. Two near LOB's due to LLE giving way.    Patient is accompained by:  Family member    Pertinent History  Patient is a 63 y.o. female with history of stroke receiving care previously from [facility] at [facility]. She was initially admitted to ED on 04/17/18 with dysphagia and right sided weakness s/p left MCA CVA, extubated on 04/19/18 and discharged on 04/23/18 to private residence. Was independent with ADLs/IADLs prior to admission. Inpatient PT reported decreased safety awareness and awareness of deficits, with deficits in the areas of balance, ambulation ability, transfers. Was able to ambulate 125 feet in hospital with min guard without AD, and independent with bed mobility.    Limitations  Lifting;Standing;Walking;Writing;House hold activities    How long can you sit comfortably?  As long as desired    How long can you stand comfortably?  5 minutes    How long can you walk comfortably?  Able to grocery shop, requires a seated rest break for a couple of minutes.    Patient Stated Goals  Wants to be able to use right arm and hand, and to feel more confortable with going shopping with her husband.    Currently in Pain?  Yes    Pain Score  2     Pain Location  Leg    Pain Orientation  Right    Pain Descriptors / Indicators  Aching    Pain Type  Acute pain    Pain Onset  Today    Pain Frequency  Constant  vitals: 143/61 pulse 64        Treatment:  Sit to stand with airex pad under feet 10x; CGA, challenged with eccentric control   Standing with UE support airex pad: -Toe taps SUE support 12x each LE -static stand with horizontal head turns 60 seconds -static stand with vertical head turns 60 seconds -tandem stance one foot on 7" step 30 second holds x 2 trials each LE  Scanning room with horizontal and vertical head turns to find cones while negotiating obstacles to mimic a grocery store ; 2x 96 ft.   Seated: 4' step toe taps 2x 30 second for coordination, spatial awareness, and muscle activation sequencing  Hallway: Dual task ambulation with patient naming grocery list first 100 ft, naming favorite places to travel second 100 ft. Initially challenged with dual task with  foot drag -horizontal head turns 200 ft without AD  -vertical head turns 200 ft, one near LOB without AD  Required cues to increase step length and to improve RLE foot clearance for less foot drag; She exhibits increased foot drag with fatigue and with head turns;   Pt educated throughout session about proper posture and technique with exercises. Improved exercise technique, movement at target joints, use of target muscles after min to mod verbal, visual, tactile cues.                     PT Education - 01/09/19 1341    Education Details  exercise technique, stability, body mechanics    Person(s) Educated  Patient    Methods  Explanation;Demonstration;Tactile cues;Verbal cues    Comprehension  Verbalized understanding;Returned demonstration;Verbal cues required;Tactile cues required       PT Short Term Goals - 12/28/18 1352      PT SHORT TERM GOAL #1   Title  Patient will be independent in initial home exercise program to improve strength/mobility for better functional independence with ADLs.    Baseline  Not made yet., 12/28/18: given a HEP but still needs cues for proper technique    Time  6    Period  Weeks    Status  Partially Met    Target Date  01/24/19      PT SHORT TERM GOAL #2   Title  Patient (> 71 years old) will complete five times sit to stand test in < 15 seconds indicating an increased LE strength and improved balance for decreased falls risk.    Baseline  12/13/18: 17.52 sec; 12/28/18: 13.53 sec without HHA    Time  6    Period  Weeks    Status  Achieved    Target Date  01/24/19        PT Long Term Goals - 12/28/18 1353      PT LONG TERM GOAL #1   Title  Patient will increase 10 meter walk test to >1.70ms as to improve gait speed for better community ambulation and to reduce fall risk.    Baseline  12/13/18: 0.64 m/s with rollator, 0.72 m/s without AD; 12/28/18: 0.84 m/s without AD    Time  12    Period  Weeks    Status  New    Target  Date  03/06/18      PT LONG TERM GOAL #2   Title  Patient will increase six minute walk test distance to >1000 for progression to community ambulator and improve gait ability.    Baseline  12/13/18: 610 ft with rollator; 12/28/18: deferred  Time  12    Period  Weeks    Status  On-going    Target Date  03/06/18      PT LONG TERM GOAL #3   Title  Patient will be require no assist with ascend/descend 3 steps using Least restrictive assistive device without LOB to improve ability to get in/out of home.    Baseline  Dependent on rail assist to reach front door. 12/28/18: uses rail, one step at a time    Time  12    Period  Weeks    Status  Partially Met    Target Date  03/06/18      PT LONG TERM GOAL #4   Title  Patient will increase BLE gross strength to 4+/5 as to improve functional strength for independent gait, increased standing tolerance and increased ADL ability.    Baseline  12/13/18: RLE hip weakness; 12/28/18: RLE hip extension: 2+/5, hip abduction/adduction 3-/5, knee grossly 4-/5    Time  12    Period  Weeks    Status  Partially Met    Target Date  03/06/18      PT LONG TERM GOAL #5   Title  Patient will increase Functional Gait Assessment score to >20/30 as to reduce fall risk and improve dynamic gait safety with community ambulation.    Baseline  12/28/18: 13/24    Time  12    Period  Weeks    Status  New    Target Date  03/06/18            Plan - 01/09/19 1756    Clinical Impression Statement  Patient presents with excellent motivation to physical therapy session. Has new onset of LLE weakness however did not demonstrate any other neurological changes. Patient aware of FAST signs and when to call/go to the doctor. She is challenged with dual task ambulation with decreased foot clearance. She would benefit from additional skilled PT intervention to improve strength, balance and gait safety;    Personal Factors and Comorbidities  Age;Comorbidity 2;Fitness;Time  since onset of injury/illness/exacerbation    Comorbidities  HTN, Hx of stroke    Examination-Activity Limitations  Bathing;Bed Mobility;Bend;Carry;Dressing;Lift;Locomotion Level;Reach Overhead;Squat;Stairs;Stand;Transfers    Examination-Participation Restrictions  Cleaning;Community Activity;Driving;Laundry;Shop;Yard Work    Stability/Clinical Decision Making  Stable/Uncomplicated    Rehab Potential  Good    PT Frequency  2x / week    PT Duration  12 weeks    PT Treatment/Interventions  ADLs/Self Care Home Management;Aquatic Therapy;Cryotherapy;Electrical Stimulation;Moist Heat;DME Instruction;Gait training;Stair training;Functional mobility training;Therapeutic activities;Therapeutic exercise;Balance training;Neuromuscular re-education;Patient/family education;Orthotic Fit/Training;Manual techniques;Compression bandaging;Passive range of motion;Dry needling;Energy conservation;Taping    PT Next Visit Plan  review HEP, balance    PT Home Exercise Plan  Not developed yet.    Consulted and Agree with Plan of Care  Patient       Patient will benefit from skilled therapeutic intervention in order to improve the following deficits and impairments:  Abnormal gait, Decreased balance, Decreased endurance, Decreased mobility, Difficulty walking, Hypomobility, Decreased range of motion, Improper body mechanics, Decreased activity tolerance, Decreased strength, Impaired UE functional use, Postural dysfunction  Visit Diagnosis: Muscle weakness (generalized)  Unsteadiness on feet  Hemiplegia and hemiparesis following cerebral infarction affecting right dominant side (HCC)  Other abnormalities of gait and mobility     Problem List Patient Active Problem List   Diagnosis Date Noted  . Mixed hyperlipidemia 09/28/2018  . Middle cerebral artery embolism, left 04/18/2018  . History of stroke 04/17/2018  . IFG (  impaired fasting glucose) 03/28/2018  . Essential hypertension, benign 01/15/2017  .  Asthma 01/15/2017   Janna Arch, PT, DPT   01/09/2019, 5:58 PM  [place] MAIN [facility] SERVICES [address] [place], [place], [address] Phone: [phone]   Fax:  [phone]  Name: Kristin Solis MRN: [id number] Date of Birth: 1955-03-20

## 2019-01-09 NOTE — Therapy (Signed)
[facility] [address] [place], [place], [address] Phone: [phone]   Fax:  [phone]  Occupational Therapy Treatment  Patient Details  Name: Kristin Solis MRN: [id number] Date of Birth: 09/08/1955 Referring Provider (OT): Aura Dialsannady, Jolene   Encounter Date: 01/09/2019  OT End of Session - 01/09/19 1319    Visit Number  5    Number of Visits  13    Date for OT Re-Evaluation  02/03/19    OT Start Time  1300    OT Stop Time  1345    OT Time Calculation (min)  45 min    Activity Tolerance  Patient tolerated treatment well    Behavior During Therapy  [facility] for tasks assessed/performed       Past Medical History:  Diagnosis Date  . Asthma   . Hypertension   . Stroke [facility](HCC)     Past Surgical History:  Procedure Laterality Date  . ABDOMINAL HYSTERECTOMY  2005   total  . IR CT HEAD LTD  04/18/2018  . IR INTRAVSC STENT CERV CAROTID W/O EMB-PROT MOD SED INC ANGIO  04/18/2018  . IR PERCUTANEOUS ART THROMBECTOMY/INFUSION INTRACRANIAL INC DIAG ANGIO  04/18/2018  . OOPHORECTOMY    . RADIOLOGY WITH ANESTHESIA N/A 04/17/2018   Procedure: RADIOLOGY WITH ANESTHESIA;  Surgeon: Julieanne Cottoneveshwar, Sanjeev, MD;  Location: [facility] OR;  Service: Radiology;  Laterality: N/A;    There were no vitals filed for this visit.  Subjective Assessment - 01/09/19 1316    Subjective   Patient reports she feels really off balance today, she came with her cane.  She reports she almost fell getting in the truck today, left leg felt weak and hit her right leg on the truck.    Pertinent History  Patient is a 63 y.o. female with history of stroke,  admitted to ED on 04/17/18 with dysphagia and right sided weakness s/p left MCA CVA, extubated on 04/19/18 and discharged on 04/23/18 to private residence    Patient Stated Goals  Patient reports she would like to get her right hand to move more and be able to use for daily tasks.    Currently in Pain?  Yes    Pain Score  2      Pain Location  Leg    Pain Orientation  Left    Pain Descriptors / Indicators  Aching    Pain Type  Acute pain    Pain Onset  Today    Pain Frequency  Constant    Multiple Pain Sites  No         Patient reports she feels off balance today, almost fell getting in the truck to get here, one loss of balance when entering the gym with assist from therapist to recover.   Pt reports she has been trying to pick up the remote, a pen and drink bottle with her right hand.  Patient reports her BP was 136/70 this am, when we first checked in the clinic it was 140/47 Rechecked BP after performing Contrast 161/70, pulse 67.  Contrast to RUE hand and wrist prior to exercises and ROM for 11 mins, alternating warm and cold to decrease edema and prepare hand for therex.  Following contrast, PROM to R hand for finger flexion, fisting followed by extension.  Patient performing A/AAROM for finger flexion, opposition and extension.  Wrist flexion/ext, supination and pronation.  Patient able to achieve full PROM of finger flexion after therapy and multiple trials  of exercise.  No pain noted this date with hand or fingers.   Patient seen for trials of picking up objects from tabletop for grasp and release.  Resistive pinch pins for yellow, red and green pins to place onto small dowel with limited reach.    Response to tx:   Patient continues to make good progress with her right hand, decreased edema, increased motion and using hand more with daily tasks as an assist and to grasp and hold items now.  She continues to demo edema which limits her motion and continues to work on edema control measures, wears her edema compression glove.  Patient with some decreased balance today and using a cane upon arrival, we switched to a loaner walker for safety, monitored BP and discussed with PT with handoff for her next session.  Patient denies any other symptoms and denies dizziness.  Encouraged her to follow up with MD if it  persists.                        OT Education - 01/09/19 1318    Education Details  edema control measures, ROM, safety with movement, balance    Person(s) Educated  Patient    Methods  Explanation;Demonstration    Comprehension  Verbalized understanding;Returned demonstration          OT Long Term Goals - 12/22/18 1841      OT LONG TERM GOAL #1   Title  Patient will complete home exercise program with modified independence.    Baseline  no current program    Time  6    Period  Weeks    Status  New    Target Date  02/03/19      OT LONG TERM GOAL #2   Title  Patient will demonstrate understanding of Edema control measures for her right upper extremity and implement into daily routine.    Baseline  has not been educated on edema control    Time  3    Period  Weeks    Status  New    Target Date  01/12/19      OT LONG TERM GOAL #3   Title  Patient will demonstrate improved grip by 5 # in right Hand to be able to hold items in hand with gross grasp.    Baseline  2# on right, unable to grasp items    Time  6    Period  Weeks    Status  New    Target Date  02/03/19      OT LONG TERM GOAL #4   Title  Patient will increase range of motion of fingers in right hand to be able to grasp built-up utensils for self feeding with modified independence.    Baseline  unable to feed self or hold utensils with right hand    Time  6    Period  Months    Status  New    Target Date  02/03/19      OT LONG TERM GOAL #5   Title  Patient will improve right hand function to be able to grasp pen to sign name on important papers with fair legibility.    Baseline  unable to hold pen or sign name    Time  6    Period  Weeks    Status  New    Target Date  02/03/19      Long Term Additional Goals  Additional Long Term Goals  Yes      OT LONG TERM GOAL #6   Title  Patient will hold a cup with modified independence.    Baseline  unable at eval with right hand    Time  6     Period  Weeks    Status  New    Target Date  02/03/19      OT LONG TERM GOAL #7   Title  Patient will improve right hand function to manipulate buttons, snaps, zippers with modified independence.    Baseline  difficulty at eval and requires assist at times.    Time  6    Period  Weeks    Status  New    Target Date  02/03/19            Plan - 01/09/19 1320    Clinical Impression Statement  Patient continues to make good progress with her right hand, decreased edema, increased motion and using hand more with daily tasks as an assist and to grasp and hold items now.  She continues to demo edema which limits her motion and continues to work on edema control measures, wears her edema compression glove.  Patient with some decreased balance today and using a cane upon arrival, we switched to a loaner walker for safety, monitored BP and discussed with PT with handoff for her next session.  Patient denies any other symptoms and denies dizziness.  Encouraged her to follow up with MD if it persists.    OT Occupational Profile and History  Detailed Assessment- Review of Records and additional review of physical, cognitive, psychosocial history related to current functional performance    Occupational performance deficits (Please refer to evaluation for details):  ADL's;IADL's;Work;Leisure    Body Structure / Function / Physical Skills  ADL;Coordination;GMC;UE functional use;Balance;Decreased knowledge of use of DME;Flexibility;IADL;Dexterity;FMC;Proprioception;Strength;Edema;ROM    Cognitive Skills  Memory    Psychosocial Skills  Environmental  Adaptations;Habits;Routines and Behaviors    Rehab Potential  Good    Clinical Decision Making  Limited treatment options, no task modification necessary    Comorbidities Affecting Occupational Performance:  May have comorbidities impacting occupational performance    Modification or Assistance to Complete Evaluation   No modification of tasks or assist  necessary to complete eval    OT Frequency  2x / week    OT Duration  6 weeks    OT Treatment/Interventions  Self-care/ADL training;Cryotherapy;Therapeutic exercise;DME and/or AE instruction;Functional Mobility Training;Cognitive remediation/compensation;Balance training;Electrical Stimulation;Neuromuscular education;Manual Therapy;Splinting;Moist Heat;Contrast Bath;Therapeutic activities;Patient/family education;Passive range of motion    Consulted and Agree with Plan of Care  Patient       Patient will benefit from skilled therapeutic intervention in order to improve the following deficits and impairments:   Body Structure / Function / Physical Skills: ADL, Coordination, GMC, UE functional use, Balance, Decreased knowledge of use of DME, Flexibility, IADL, Dexterity, FMC, Proprioception, Strength, Edema, ROM Cognitive Skills: Memory Psychosocial Skills: Environmental  Adaptations, Habits, Routines and Behaviors   Visit Diagnosis: Muscle weakness (generalized)  Unsteadiness on feet  Other lack of coordination  Hemiplegia and hemiparesis following cerebral infarction affecting right dominant side (HCC)  Localized edema    Problem List Patient Active Problem List   Diagnosis Date Noted  . Mixed hyperlipidemia 09/28/2018  . Middle cerebral artery embolism, left 04/18/2018  . History of stroke 04/17/2018  . IFG (impaired fasting glucose) 03/28/2018  . Essential hypertension, benign 01/15/2017  . Asthma 01/15/2017   Jadie Comas Cornelius Moras,  OTR/L, CLT  Thuan Tippett 01/10/2019, 11:14 AM  [place] [facility] MAIN [facility] SERVICES [address] [place], [place], [address] Phone: [phone]   Fax:  [phone]  Name: Kristin Solis MRN: [id number] Date of Birth: 1955-12-11

## 2019-01-11 ENCOUNTER — Encounter: Payer: Self-pay | Admitting: Occupational Therapy

## 2019-01-11 ENCOUNTER — Ambulatory Visit: Payer: Medicaid Other

## 2019-01-11 ENCOUNTER — Other Ambulatory Visit: Payer: Self-pay

## 2019-01-11 ENCOUNTER — Ambulatory Visit: Payer: Medicaid Other | Admitting: Occupational Therapy

## 2019-01-11 DIAGNOSIS — I69351 Hemiplegia and hemiparesis following cerebral infarction affecting right dominant side: Secondary | ICD-10-CM

## 2019-01-11 DIAGNOSIS — R2681 Unsteadiness on feet: Secondary | ICD-10-CM | POA: Diagnosis not present

## 2019-01-11 DIAGNOSIS — R278 Other lack of coordination: Secondary | ICD-10-CM | POA: Diagnosis not present

## 2019-01-11 DIAGNOSIS — Z8673 Personal history of transient ischemic attack (TIA), and cerebral infarction without residual deficits: Secondary | ICD-10-CM | POA: Diagnosis not present

## 2019-01-11 DIAGNOSIS — R600 Localized edema: Secondary | ICD-10-CM

## 2019-01-11 DIAGNOSIS — M6281 Muscle weakness (generalized): Secondary | ICD-10-CM | POA: Diagnosis not present

## 2019-01-11 DIAGNOSIS — R2689 Other abnormalities of gait and mobility: Secondary | ICD-10-CM

## 2019-01-11 NOTE — Therapy (Signed)
[place] MAIN [facility] SERVICES [address] [place], [place], [address] Phone: [phone]   Fax:  [phone]  Physical Therapy Treatment  Patient Details  Name: Kristin Solis MRN: [id number] Date of Birth: 1955-05-12 Referring Provider (PT): [name]   Encounter Date: 01/11/2019  PT End of Session - 01/11/19 1446    Visit Number  8    Number of Visits  25    Date for PT Re-Evaluation  03/07/19    Authorization Type  eval 10/27    Authorization Time Period  approved 3 visist 11/4-11/13, 8 visits aproved ( 01/02/19-12/13)    Authorization - Visit Number  5    Authorization - Number of Visits  8    PT Start Time  1224    PT Stop Time  1431    PT Time Calculation (min)  46 min    Equipment Utilized During Treatment  Gait belt    Activity Tolerance  Patient tolerated treatment well;Patient limited by fatigue    Behavior During Therapy  WFL for tasks assessed/performed       Past Medical History:  Diagnosis Date  . Asthma   . Hypertension   . Stroke [facility])     Past Surgical History:  Procedure Laterality Date  . ABDOMINAL HYSTERECTOMY  2005   total  . IR CT HEAD LTD  04/18/2018  . IR INTRAVSC STENT CERV CAROTID W/O EMB-PROT MOD SED INC ANGIO  04/18/2018  . IR PERCUTANEOUS ART THROMBECTOMY/INFUSION INTRACRANIAL INC DIAG ANGIO  04/18/2018  . OOPHORECTOMY    . RADIOLOGY WITH ANESTHESIA N/A 04/17/2018   Procedure: RADIOLOGY WITH ANESTHESIA;  Surgeon: Luanne Bras, MD;  Location: [place];  Service: Radiology;  Laterality: N/A;    There were no vitals filed for this visit.  Subjective Assessment - 01/11/19 1445    Subjective  Patient reports no further signs of LLE weakness. No falls or LOB since last session. Has been compliant with HEP.    Patient is accompained by:  Family member    Pertinent History  Patient is a 63 y.o. female with history of stroke receiving care previously from [facility]. She was  initially admitted to ED on 04/17/18 with dysphagia and right sided weakness s/p left MCA CVA, extubated on 04/19/18 and discharged on 04/23/18 to private residence. Was independent with ADLs/IADLs prior to admission. Inpatient PT reported decreased safety awareness and awareness of deficits, with deficits in the areas of balance, ambulation ability, transfers. Was able to ambulate 125 feet in hospital with min guard without AD, and independent with bed mobility.    Limitations  Lifting;Standing;Walking;Writing;House hold activities    How long can you sit comfortably?  As long as desired    How long can you stand comfortably?  5 minutes    How long can you walk comfortably?  Able to grocery shop, requires a seated rest break for a couple of minutes.    Patient Stated Goals  Wants to be able to use right arm and hand, and to feel more confortable with going shopping with her husband.    Currently in Pain?  No/denies           Goals:  10 MWT: 11 seconds, 10 seconds second trial no AD =1.0 MET 6 MWT:723 ft; one rest break, no AD  Ascend/descend 3 steps: able to perform step over step with BUE support, able to do step to with SUE support,  BLE strength: see  goals FGA: just completed 2 weeks ago    Two personal goals: cooking and driving: Patient wants to be able to stand long enough to cook again  static standing: 2 minutes and 16 seconds.    Treat: Soccer ball kicks for coordination/sequencing for muscle activating/timing/recruitment x 2 minutes   soccer ball kicking between two goal for spatial awareness and coordination x 3 minutes    cone tap with foot, very challenging with spatial awareness and coordination/clearance of R foot. x10 each LE   RTB hamstring curl 15x RLE RTB hip extension march press down 15x RLE    Patient is demonstrating excellent progression towards functional goals at this time with increased capacity for functional ambulation and increased velocity of  ambulation. Patient continues to be challenged with single limb stance on RLE as well as coordination with RLE and spatial awareness. She would benefit from additional skilled PT intervention to improve strength, balance and gait safety;  PT Education - 01/11/19 1446    Education Details  goals, exercise technique    Person(s) Educated  Patient    Methods  Explanation;Tactile cues;Demonstration;Verbal cues    Comprehension  Verbalized understanding;Returned demonstration;Verbal cues required;Tactile cues required       PT Short Term Goals - 12/28/18 1352      PT SHORT TERM GOAL #1   Title  Patient will be independent in initial home exercise program to improve strength/mobility for better functional independence with ADLs.    Baseline  Not made yet., 12/28/18: given a HEP but still needs cues for proper technique    Time  6    Period  Weeks    Status  Partially Met    Target Date  01/24/19      PT SHORT TERM GOAL #2   Title  Patient (> 33 years old) will complete five times sit to stand test in < 15 seconds indicating an increased LE strength and improved balance for decreased falls risk.    Baseline  12/13/18: 17.52 sec; 12/28/18: 13.53 sec without HHA    Time  6    Period  Weeks    Status  Achieved    Target Date  01/24/19        PT Long Term Goals - 01/11/19 1431      PT LONG TERM GOAL #1   Title  Patient will increase 10 meter walk test to >1.39ms as to improve gait speed for better community ambulation and to reduce fall risk.    Baseline  12/13/18: 0.64 m/s with rollator, 0.72 m/s without AD; 12/28/18: 0.84 m/s without AD 11/25: 1.0 m/s without AD    Time  12    Period  Weeks    Status  Achieved      PT LONG TERM GOAL #2   Title  Patient will increase six minute walk test distance to >1000 for progression to community ambulator and improve gait ability.    Baseline  12/13/18: 652ft with rollator; 12/28/18: deferred 11/25: 723 ft without an AD    Time  12    Period   Weeks    Status  Partially Met    Target Date  03/06/18      PT LONG TERM GOAL #3   Title  Patient will be require no assist with ascend/descend 3 steps using Least restrictive assistive device without LOB to improve ability to get in/out of home.    Baseline  Dependent on rail assist to reach front door. 12/28/18:  uses rail, one step at a time 11/25: able to ascend/descend step over step with BUE support, able to perform step to pattern with SUE support    Time  12    Period  Weeks    Status  Partially Met    Target Date  03/06/18      PT LONG TERM GOAL #4   Title  Patient will increase BLE gross strength to 4+/5 as to improve functional strength for independent gait, increased standing tolerance and increased ADL ability.    Baseline  12/13/18: RLE hip weakness; 12/28/18: RLE hip extension: 2+/5, hip abduction/adduction 3-/5, knee grossly 4-/5 11/25: RLE hip extension 3-/5 add/abd 3/5 knee 4/5    Time  12    Period  Weeks    Status  Partially Met    Target Date  03/06/18      PT LONG TERM GOAL #5   Title  Patient will increase Functional Gait Assessment score to >20/30 as to reduce fall risk and improve dynamic gait safety with community ambulation.    Baseline  12/28/18: 13/24    Time  12    Period  Weeks    Status  New            Plan - 01/11/19 1447    Clinical Impression Statement  Patient is demonstrating excellent progression towards functional goals at this time with increased capacity for functional ambulation and increased velocity of ambulation. Patient continues to be challenged with single limb stance on RLE as well as coordination with RLE and spatial awareness. She would benefit from additional skilled PT intervention to improve strength, balance and gait safety;    Personal Factors and Comorbidities  Age;Comorbidity 2;Fitness;Time since onset of injury/illness/exacerbation    Comorbidities  HTN, Hx of stroke    Examination-Activity Limitations  Bathing;Bed  Mobility;Bend;Carry;Dressing;Lift;Locomotion Level;Reach Overhead;Squat;Stairs;Stand;Transfers    Examination-Participation Restrictions  Cleaning;Community Activity;Driving;Laundry;Shop;Yard Work    Stability/Clinical Decision Making  Stable/Uncomplicated    Rehab Potential  Good    PT Frequency  2x / week    PT Duration  12 weeks    PT Treatment/Interventions  ADLs/Self Care Home Management;Aquatic Therapy;Cryotherapy;Electrical Stimulation;Moist Heat;DME Instruction;Gait training;Stair training;Functional mobility training;Therapeutic activities;Therapeutic exercise;Balance training;Neuromuscular re-education;Patient/family education;Orthotic Fit/Training;Manual techniques;Compression bandaging;Passive range of motion;Dry needling;Energy conservation;Taping    PT Next Visit Plan  review HEP, balance    PT Home Exercise Plan  Not developed yet.    Consulted and Agree with Plan of Care  Patient       Patient will benefit from skilled therapeutic intervention in order to improve the following deficits and impairments:  Abnormal gait, Decreased balance, Decreased endurance, Decreased mobility, Difficulty walking, Hypomobility, Decreased range of motion, Improper body mechanics, Decreased activity tolerance, Decreased strength, Impaired UE functional use, Postural dysfunction  Visit Diagnosis: Muscle weakness (generalized)  Hemiplegia and hemiparesis following cerebral infarction affecting right dominant side (HCC)  Unsteadiness on feet  Other abnormalities of gait and mobility     Problem List Patient Active Problem List   Diagnosis Date Noted  . Mixed hyperlipidemia 09/28/2018  . Middle cerebral artery embolism, left 04/18/2018  . History of stroke 04/17/2018  . IFG (impaired fasting glucose) 03/28/2018  . Essential hypertension, benign 01/15/2017  . Asthma 01/15/2017   Janna Arch, PT, DPT   01/11/2019, 3:01 PM  [place] MAIN [facility]  SERVICES [address] [place], [place], [address] Phone: [phone]   Fax:  [phone]  Name: Kristin Solis MRN: [id number] Date  of Birth: Dec 30, 1955

## 2019-01-11 NOTE — Therapy (Signed)
[place] [facility] [address] [place], [place], [address] Phone: [phone]   Fax:  [phone]  Occupational Therapy Treatment  Patient Details  Name: Kristin AblerBeverly J Solis MRN: [id number] Date of Birth: [birth year] Referring Provider (OT): Aura Dialsannady, Jolene   Encounter Date: 01/11/2019  OT End of Session - 01/11/19 1310    Visit Number  6    Number of Visits  13    Date for OT Re-Evaluation  02/03/19    OT Start Time  1300    OT Stop Time  1344    OT Time Calculation (min)  44 min    Activity Tolerance  Patient tolerated treatment well    Behavior During Therapy  [facility] for tasks assessed/performed       Past Medical History:  Diagnosis Date  . Asthma   . Hypertension   . Stroke [facility](HCC)     Past Surgical History:  Procedure Laterality Date  . ABDOMINAL HYSTERECTOMY  2005   total  . IR CT HEAD LTD  04/18/2018  . IR INTRAVSC STENT CERV CAROTID W/O EMB-PROT MOD SED INC ANGIO  04/18/2018  . IR PERCUTANEOUS ART THROMBECTOMY/INFUSION INTRACRANIAL INC DIAG ANGIO  04/18/2018  . OOPHORECTOMY    . RADIOLOGY WITH ANESTHESIA N/A 04/17/2018   Procedure: RADIOLOGY WITH ANESTHESIA;  Surgeon: Julieanne Cottoneveshwar, Sanjeev, MD;  Location: [facility] OR;  Service: Radiology;  Laterality: N/A;    There were no vitals filed for this visit.  Subjective Assessment - 01/11/19 1308    Subjective   Patient reports the off balance she felt the other day is better, she is not really sure if the left leg still feels weak, she did have trouble still with getting in the truck.    Pertinent History  Patient is a 63 y.o. female with history of stroke,  admitted to ED on 04/17/18 with dysphagia and right sided weakness s/p left MCA CVA, extubated on 04/19/18 and discharged on 04/23/18 to private residence    Patient Stated Goals  Patient reports she would like to get her right hand to move more and be able to use for daily tasks.    Currently in Pain?  No/denies    Pain Score  0-No pain         Contrast Contrast to RUE hand and wrist prior to exercises and ROM for 11 mins, alternating warm and cold to decrease edema and prepare hand for therex.  Therapeutic Exercises: Following contrast, PROM to right hand for finger flexion, able to achieve full fist after prolonged stretching.  Issued and instructed on yellow theraputty with patient performing exercises as outlined below.      Access Code: [email or web address]  URL: [email or web address]  Date: 01/11/2019  Prepared by: Derrek GuAmy Rhen Dossantos   Exercises Putty Squeezes - 10 reps - 1 sets - 2x daily - 7x weekly Key Pinch with Putty - 10 reps - 1 sets - 2x daily - 7x weekly 3-Point Pinch with Putty - 10 reps - 1 sets - 1x daily - 7x weekly Tip Pinch with Putty - 10 reps - 1 sets - 2x daily - 7x weekly    Response to tx: Progress continues with decreasing edema, increasing ROM and now demonstrating the ability to hold items with gross grasp.  She was able to demonstrate holding cane in right hand today with loose grasp and will need to work towards improving secure grasp on a variety of sized objects.  Issued and instructed on theraputty for strengthening  of right hand this date and patient able to demonstrate understanding of exercises and will continue at home over the weekend.  Patient is pleased with progress in right hand.  Continue to work towards goals in plan of care to maximize safety and independence in daily tasks.             OT Education - 01/11/19 1309    Education Details  edema control measures, ROM, safety with movement, balance    Person(s) Educated  Patient    Methods  Explanation;Demonstration    Comprehension  Verbalized understanding;Returned demonstration          OT Long Term Goals - 12/22/18 1841      OT LONG TERM GOAL #1   Title  Patient will complete home exercise program with modified independence.    Baseline  no current program    Time  6    Period  Weeks    Status  New     Target Date  02/03/19      OT LONG TERM GOAL #2   Title  Patient will demonstrate understanding of Edema control measures for her right upper extremity and implement into daily routine.    Baseline  has not been educated on edema control    Time  3    Period  Weeks    Status  New    Target Date  01/12/19      OT LONG TERM GOAL #3   Title  Patient will demonstrate improved grip by 5 # in right Hand to be able to hold items in hand with gross grasp.    Baseline  2# on right, unable to grasp items    Time  6    Period  Weeks    Status  New    Target Date  02/03/19      OT LONG TERM GOAL #4   Title  Patient will increase range of motion of fingers in right hand to be able to grasp built-up utensils for self feeding with modified independence.    Baseline  unable to feed self or hold utensils with right hand    Time  6    Period  Months    Status  New    Target Date  02/03/19      OT LONG TERM GOAL #5   Title  Patient will improve right hand function to be able to grasp pen to sign name on important papers with fair legibility.    Baseline  unable to hold pen or sign name    Time  6    Period  Weeks    Status  New    Target Date  02/03/19      Long Term Additional Goals   Additional Long Term Goals  Yes      OT LONG TERM GOAL #6   Title  Patient will hold a cup with modified independence.    Baseline  unable at eval with right hand    Time  6    Period  Weeks    Status  New    Target Date  02/03/19      OT LONG TERM GOAL #7   Title  Patient will improve right hand function to manipulate buttons, snaps, zippers with modified independence.    Baseline  difficulty at eval and requires assist at times.    Time  6    Period  Weeks    Status  New  Target Date  02/03/19            Plan - 01/11/19 1310    Clinical Impression Statement  Progress continues with decreasing edema, increasing ROM and now demonstrating the ability to hold items with gross grasp.  She was  able to demonstrate holding cane in right hand today with loose grasp and will need to work towards improving secure grasp on a variety of sized objects.  Issued and instructed on theraputty for strengthening of right hand this date and patient able to demonstrate understanding of exercises and will continue at home over the weekend.  Patient is pleased with progress in right hand.  Continue to work towards goals in plan of care to maximize safety and independence in daily tasks.    OT Occupational Profile and History  Detailed Assessment- Review of Records and additional review of physical, cognitive, psychosocial history related to current functional performance    Occupational performance deficits (Please refer to evaluation for details):  ADL's;IADL's;Work;Leisure    Body Structure / Function / Physical Skills  ADL;Coordination;GMC;UE functional use;Balance;Decreased knowledge of use of DME;Flexibility;IADL;Dexterity;FMC;Proprioception;Strength;Edema;ROM    Cognitive Skills  Memory    Psychosocial Skills  Environmental  Adaptations;Habits;Routines and Behaviors    Rehab Potential  Good    Clinical Decision Making  Limited treatment options, no task modification necessary    Comorbidities Affecting Occupational Performance:  May have comorbidities impacting occupational performance    Modification or Assistance to Complete Evaluation   No modification of tasks or assist necessary to complete eval    OT Frequency  2x / week    OT Duration  6 weeks    OT Treatment/Interventions  Self-care/ADL training;Cryotherapy;Therapeutic exercise;DME and/or AE instruction;Functional Mobility Training;Cognitive remediation/compensation;Balance training;Electrical Stimulation;Neuromuscular education;Manual Therapy;Splinting;Moist Heat;Contrast Bath;Therapeutic activities;Patient/family education;Passive range of motion    Consulted and Agree with Plan of Care  Patient       Patient will benefit from skilled  therapeutic intervention in order to improve the following deficits and impairments:   Body Structure / Function / Physical Skills: ADL, Coordination, GMC, UE functional use, Balance, Decreased knowledge of use of DME, Flexibility, IADL, Dexterity, FMC, Proprioception, Strength, Edema, ROM Cognitive Skills: Memory Psychosocial Skills: Environmental  Adaptations, Habits, Routines and Behaviors   Visit Diagnosis: Muscle weakness (generalized)  Hemiplegia and hemiparesis following cerebral infarction affecting right dominant side (HCC)  Other lack of coordination  Localized edema    Problem List Patient Active Problem List   Diagnosis Date Noted  . Mixed hyperlipidemia 09/28/2018  . Middle cerebral artery embolism, left 04/18/2018  . History of stroke 04/17/2018  . IFG (impaired fasting glucose) 03/28/2018  . Essential hypertension, benign 01/15/2017  . Asthma 01/15/2017   Kristin Solis, OTR/L, CLT  Maxwell Lemen 01/12/2019, 6:10 PM  [place] [facility] MAIN [facility] SERVICES [address] [place], [place], [address] Phone: [phone]   Fax:  [phone]  Name: HANG AMMON MRN: [id number] Date of Birth: 1956-01-14

## 2019-01-16 ENCOUNTER — Ambulatory Visit: Payer: Medicaid Other

## 2019-01-16 ENCOUNTER — Ambulatory Visit: Payer: Medicaid Other | Admitting: Occupational Therapy

## 2019-01-16 ENCOUNTER — Encounter: Payer: Self-pay | Admitting: Occupational Therapy

## 2019-01-16 ENCOUNTER — Other Ambulatory Visit: Payer: Self-pay

## 2019-01-16 DIAGNOSIS — M6281 Muscle weakness (generalized): Secondary | ICD-10-CM

## 2019-01-16 DIAGNOSIS — R278 Other lack of coordination: Secondary | ICD-10-CM

## 2019-01-16 DIAGNOSIS — I69351 Hemiplegia and hemiparesis following cerebral infarction affecting right dominant side: Secondary | ICD-10-CM

## 2019-01-16 DIAGNOSIS — R2681 Unsteadiness on feet: Secondary | ICD-10-CM | POA: Diagnosis not present

## 2019-01-16 DIAGNOSIS — Z8673 Personal history of transient ischemic attack (TIA), and cerebral infarction without residual deficits: Secondary | ICD-10-CM | POA: Diagnosis not present

## 2019-01-16 DIAGNOSIS — R2689 Other abnormalities of gait and mobility: Secondary | ICD-10-CM | POA: Diagnosis not present

## 2019-01-16 DIAGNOSIS — R600 Localized edema: Secondary | ICD-10-CM | POA: Diagnosis not present

## 2019-01-16 NOTE — Therapy (Signed)
[place] MAIN [facility] SERVICES [address] [place], [place], [address] Phone: [phone]   Fax:  [phone]  Physical Therapy Treatment  Patient Details  Name: Kristin Solis MRN: [id number] Date of Birth: [birth year] Referring Provider (PT): Kristin Solis   Encounter Date: 01/16/2019  PT End of Session - 01/16/19 1352    Visit Number  9    Number of Visits  25    Date for PT Re-Evaluation  03/07/19    Authorization Type  eval 10/27    Authorization Time Period  approved 3 visist 11/4-11/13, 8 visits aproved ( 01/02/19-12/13)    Authorization - Visit Number  6    Authorization - Number of Visits  8    PT Start Time  6144    PT Stop Time  1425    PT Time Calculation (min)  40 min    Equipment Utilized During Treatment  Gait belt    Activity Tolerance  Patient tolerated treatment well;No increased pain;Patient limited by fatigue    Behavior During Therapy  [facility] for tasks assessed/performed       Past Medical History:  Diagnosis Date  . Asthma   . Hypertension   . Stroke [facility])     Past Surgical History:  Procedure Laterality Date  . ABDOMINAL HYSTERECTOMY  2005   total  . IR CT HEAD LTD  04/18/2018  . IR INTRAVSC STENT CERV CAROTID W/O EMB-PROT MOD SED INC ANGIO  04/18/2018  . IR PERCUTANEOUS ART THROMBECTOMY/INFUSION INTRACRANIAL INC DIAG ANGIO  04/18/2018  . OOPHORECTOMY    . RADIOLOGY WITH ANESTHESIA N/A 04/17/2018   Procedure: RADIOLOGY WITH ANESTHESIA;  Surgeon: Luanne Bras, MD;  Location: [place];  Service: Radiology;  Laterality: N/A;    There were no vitals filed for this visit.  Subjective Assessment - 01/16/19 1351    Subjective  Pt doing well this date. Still reports minimal wlaking at home untless at the store. No medical updates this date.    Pertinent History  Patient is a 63 y.o. female with history of stroke receiving care previously from [facility]. She was initially admitted to ED on 04/17/18  with dysphagia and right sided weakness s/p left MCA CVA, extubated on 04/19/18 and discharged on 04/23/18 to private residence. Was independent with ADLs/IADLs prior to admission. Inpatient PT reported decreased safety awareness and awareness of deficits, with deficits in the areas of balance, ambulation ability, transfers. Was able to ambulate 125 feet in hospital with min guard without AD, and independent with bed mobility.    Currently in Pain?  No/denies        INTERVENTION THIS DATE: Therapeutic Exercise -STS from chair hands free 2x10 -standing knee flexion 2x10, 2lb AW -standing hip ABDCT 2lb AW 2x10 bilat (cues to avoid hip-flexor-dominant movement pattern and better gluteal engagement)   -standing hip extension 2x10 bilat  -standing hip flexion 2x10 bilat   Neuromuscular  -AMB in gym, turns and obstacles, no assistive device, Stops at 2:39 d/t Left hip pain -SLS cone taps 1x10 bilat, intermittent UE support for LOB recovery -Soccer ball kicks for coordination/sequencing for muscle activating/timing/recruitment ,between two goal for spatial awareness and coordination: 1x10 bilat without UE support  -airex wall rebounding 5x from airex, 10x from floor rainbow ball, min-modA for balance   Patient is demonstrating excellent progression towards functional goals at this time with increased capacity for functional ambulation and increased velocity of ambulation. Patient continues to be challenged with  single limb stance on RLE as well as coordination with RLE and spatial awareness. She would benefit from additional skilled PT intervention to improve strength, balance and gait safety;       PT Short Term Goals - 12/28/18 1352      PT SHORT TERM GOAL #1   Title  Patient will be independent in initial home exercise program to improve strength/mobility for better functional independence with ADLs.    Baseline  Not made yet., 12/28/18: given a HEP but still needs cues for proper technique     Time  6    Period  Weeks    Status  Partially Met    Target Date  01/24/19      PT SHORT TERM GOAL #2   Title  Patient (> 69 years old) will complete five times sit to stand test in < 15 seconds indicating an increased LE strength and improved balance for decreased falls risk.    Baseline  12/13/18: 17.52 sec; 12/28/18: 13.53 sec without HHA    Time  6    Period  Weeks    Status  Achieved    Target Date  01/24/19        PT Long Term Goals - 01/11/19 1431      PT LONG TERM GOAL #1   Title  Patient will increase 10 meter walk test to >1.47ms as to improve gait speed for better community ambulation and to reduce fall risk.    Baseline  12/13/18: 0.64 m/s with rollator, 0.72 m/s without AD; 12/28/18: 0.84 m/s without AD 11/25: 1.0 m/s without AD    Time  12    Period  Weeks    Status  Achieved      PT LONG TERM GOAL #2   Title  Patient will increase six minute walk test distance to >1000 for progression to community ambulator and improve gait ability.    Baseline  12/13/18: 668ft with rollator; 12/28/18: deferred 11/25: 723 ft without an AD    Time  12    Period  Weeks    Status  Partially Met    Target Date  03/06/18      PT LONG TERM GOAL #3   Title  Patient will be require no assist with ascend/descend 3 steps using Least restrictive assistive device without LOB to improve ability to get in/out of home.    Baseline  Dependent on rail assist to reach front door. 12/28/18: uses rail, one step at a time 11/25: able to ascend/descend step over step with BUE support, able to perform step to pattern with SUE support    Time  12    Period  Weeks    Status  Partially Met    Target Date  03/06/18      PT LONG TERM GOAL #4   Title  Patient will increase BLE gross strength to 4+/5 as to improve functional strength for independent gait, increased standing tolerance and increased ADL ability.    Baseline  12/13/18: RLE hip weakness; 12/28/18: RLE hip extension: 2+/5, hip  abduction/adduction 3-/5, knee grossly 4-/5 11/25: RLE hip extension 3-/5 add/abd 3/5 knee 4/5    Time  12    Period  Weeks    Status  Partially Met    Target Date  03/06/18      PT LONG TERM GOAL #5   Title  Patient will increase Functional Gait Assessment score to >20/30 as to reduce fall risk and improve dynamic  gait safety with community ambulation.    Baseline  12/28/18: 13/24    Time  12    Period  Weeks    Status  New            Plan - 01/16/19 1354    Clinical Impression Statement  In general, patient demonstrating good tolerance to therapy session this date, reasonable accommodations are alllowed in-session to allow adequate rest between activities as needed, as patient does get OOB easily however recovers quickly too. Pt able to progress to weighted exercises this date, advanced from T-band activity. All interventional executed without any exacerbation of pain or other symptoms. Pt given intermittent multimodal cues to teach best possible form with exercises. Most activity performed at supervision level, but intermittent MinGuard assist is provided as needed for safety. Pt continues to make steady progress toward most goals. No home exercise updates made at this time.    Rehab Potential  Good    PT Frequency  2x / week    PT Duration  12 weeks    PT Treatment/Interventions  ADLs/Self Care Home Management;Aquatic Therapy;Cryotherapy;Electrical Stimulation;Moist Heat;DME Instruction;Gait training;Stair training;Functional mobility training;Therapeutic activities;Therapeutic exercise;Balance training;Neuromuscular re-education;Patient/family education;Orthotic Fit/Training;Manual techniques;Compression bandaging;Passive range of motion;Dry needling;Energy conservation;Taping    PT Next Visit Plan  review HEP, balance    PT Home Exercise Plan  No updates this date    Consulted and Agree with Plan of Care  Patient       Patient will benefit from skilled therapeutic intervention  in order to improve the following deficits and impairments:  Abnormal gait, Decreased balance, Decreased endurance, Decreased mobility, Difficulty walking, Hypomobility, Decreased range of motion, Improper body mechanics, Decreased activity tolerance, Decreased strength, Impaired UE functional use, Postural dysfunction  Visit Diagnosis: Muscle weakness (generalized)  Hemiplegia and hemiparesis following cerebral infarction affecting right dominant side (HCC)  Other lack of coordination  Unsteadiness on feet  Other abnormalities of gait and mobility  History of stroke     Problem List Patient Active Problem List   Diagnosis Date Noted  . Mixed hyperlipidemia 09/28/2018  . Middle cerebral artery embolism, left 04/18/2018  . History of stroke 04/17/2018  . IFG (impaired fasting glucose) 03/28/2018  . Essential hypertension, benign 01/15/2017  . Asthma 01/15/2017   2:05 PM, 01/16/19 Etta Grandchild, PT, DPT Physical Therapist - [place] [phone]     Etta Grandchild 01/16/2019, 1:56 PM  [place] MAIN [facility] SERVICES [address] [place], [place], [address] Phone: [phone]   Fax:  [phone]  Name: LEAHA CUERVO MRN: [id number] Date of Birth: [birth year]

## 2019-01-16 NOTE — Therapy (Signed)
[facility] MAIN [facility] SERVICES [address] [place], [place], [address] Phone: [phone]   Fax:  [phone]  Occupational Therapy Treatment  Patient Details  Name: Kristin Solis MRN: [id number] Date of Birth: Aug 24, 1955 Referring Provider (OT): Aura Dials   Encounter Date: 01/16/2019  OT End of Session - 01/16/19 1308    Visit Number  7    Number of Visits  13    Date for OT Re-Evaluation  02/03/19    OT Start Time  1301    OT Stop Time  1345    OT Time Calculation (min)  44 min    Activity Tolerance  Patient tolerated treatment well    Behavior During Therapy  [facility] for tasks assessed/performed       Past Medical History:  Diagnosis Date  . Asthma   . Hypertension   . Stroke [facility])     Past Surgical History:  Procedure Laterality Date  . ABDOMINAL HYSTERECTOMY  2005   total  . IR CT HEAD LTD  04/18/2018  . IR INTRAVSC STENT CERV CAROTID W/O EMB-PROT MOD SED INC ANGIO  04/18/2018  . IR PERCUTANEOUS ART THROMBECTOMY/INFUSION INTRACRANIAL INC DIAG ANGIO  04/18/2018  . OOPHORECTOMY    . RADIOLOGY WITH ANESTHESIA N/A 04/17/2018   Procedure: RADIOLOGY WITH ANESTHESIA;  Surgeon: Julieanne Cotton, MD;  Location: [facility] OR;  Service: Radiology;  Laterality: N/A;    There were no vitals filed for this visit.  Subjective Assessment - 01/16/19 1307    Subjective   Patient reports she was able to loosely tie her shoes the other day, it didn't stay tied for long but she felt good about doing it.  She was also able to get in the truck a new way.    Pertinent History  Patient is a 62 y.o. female with history of stroke,  admitted to ED on 04/17/18 with dysphagia and right sided weakness s/p left MCA CVA, extubated on 04/19/18 and discharged on 04/23/18 to private residence    Patient Stated Goals  Patient reports she would like to get her right hand to move more and be able to use for daily tasks.    Currently in Pain?  No/denies    Pain Score  0-No  pain    Multiple Pain Sites  No      Patient reports after her shower she was able to put her pants on herself over the weekend. She reports helping her husband with the thanksgiving meal, helped to fix dressing   Contrast Contrast to RUE hand and wrist prior to exercises and ROMfor 11 mins, alternating warm and cold to decrease edema and prepare hand for therex.    Therapeutic Exercises: Following contrast, PROM to right hand for finger flexion, able to achieve full fist after prolonged stretching.   ADL:  Issued and instructed on built up foam for utensils for attempts with self feeding and with stirring items (like when she helped with the stuffing but used her left hand instead of right).  Yellow Putty with self feeding simulated task with use of built up fork to pick up small pieces the size of a small meatball, also worked towards picking up pieces with right hand  With use of index and thumb with cues for prehension patterns.  Recommended variations of this task for home and patient demos understanding.  Circumferential measurements taken of right hand:   Right hand Index 7.2 cm Thumb 7.3 MCP 18.8 Dorsum of hand  20.0 Wrist 18.7 10cm from ulnar styloid 24.5 15 cm from ulnar styloid 28.2    Patient has continued to make progress with her right upper extremity. She was excited to come in today to let therapist know she was able to loosely tie her shoes this weekend. This is the first time she was able to complete this task. She also was happy to report she was able to get into her husband's truck without a step ladder and was able to help lift her self up into the truck.  She helped in the kitchen some for the holiday however she used her left hand for the majority of the tasks. We discussed ways to incorporate her right hand into kitchen tasks and she was issued built up red foam handles to place on utensils to assist with self-feeding as well as stirring items in the kitchen with a  gross grasp. She was able to demonstrate use of a fork with a built up foam to pick up pieces of putty and complete hand to mouth patterns for simulated self-feeding this date. She is going to work on this task over the next couple of days and report back next session. Her circumferential measurements of her right fingers hand and wrist showed improvement with decreased edema. Continue to work towards goals and plan of care to increase independence in necessary daily tasks.                 OT Education - 01/16/19 1801    Education Details  grasping patterns, self feeding with built up foam    Person(s) Educated  Patient    Methods  Explanation;Demonstration    Comprehension  Verbalized understanding;Returned demonstration          OT Long Term Goals - 12/22/18 1841      OT LONG TERM GOAL #1   Title  Patient will complete home exercise program with modified independence.    Baseline  no current program    Time  6    Period  Weeks    Status  New    Target Date  02/03/19      OT LONG TERM GOAL #2   Title  Patient will demonstrate understanding of Edema control measures for her right upper extremity and implement into daily routine.    Baseline  has not been educated on edema control    Time  3    Period  Weeks    Status  New    Target Date  01/12/19      OT LONG TERM GOAL #3   Title  Patient will demonstrate improved grip by 5 # in right Hand to be able to hold items in hand with gross grasp.    Baseline  2# on right, unable to grasp items    Time  6    Period  Weeks    Status  New    Target Date  02/03/19      OT LONG TERM GOAL #4   Title  Patient will increase range of motion of fingers in right hand to be able to grasp built-up utensils for self feeding with modified independence.    Baseline  unable to feed self or hold utensils with right hand    Time  6    Period  Months    Status  New    Target Date  02/03/19      OT LONG TERM GOAL #5   Title   Patient  will improve right hand function to be able to grasp pen to sign name on important papers with fair legibility.    Baseline  unable to hold pen or sign name    Time  6    Period  Weeks    Status  New    Target Date  02/03/19      Long Term Additional Goals   Additional Long Term Goals  Yes      OT LONG TERM GOAL #6   Title  Patient will hold a cup with modified independence.    Baseline  unable at eval with right hand    Time  6    Period  Weeks    Status  New    Target Date  02/03/19      OT LONG TERM GOAL #7   Title  Patient will improve right hand function to manipulate buttons, snaps, zippers with modified independence.    Baseline  difficulty at eval and requires assist at times.    Time  6    Period  Weeks    Status  New    Target Date  02/03/19            Plan - 01/16/19 1308    Clinical Impression Statement  Patient has continued to make progress with her right upper extremity. She was excited to come in today to let therapist know she was able to loosely tie her shoes this weekend. This is the first time she was able to complete this task. She also was happy to report she was able to get into her husband's truck without a step ladder and was able to help lift her self up into the truck.  She helped in the kitchen some for the holiday however she used her left hand for the majority of the tasks. We discussed ways to incorporate her right hand into kitchen tasks and she was issued built up red foam handles to place on utensils to assist with self-feeding as well as stirring items in the kitchen with a gross grasp. She was able to demonstrate use of a fork with a built up foam to pick up pieces of putty and complete hand to mouth patterns for simulated self-feeding this date. She is going to work on this task over the next couple of days and report back next session. Her circumferential measurements of her right fingers hand and wrist showed improvement with decreased  edema. Continue to work towards goals and plan of care to increase independence in necessary daily tasks.    OT Occupational Profile and History  Detailed Assessment- Review of Records and additional review of physical, cognitive, psychosocial history related to current functional performance    Occupational performance deficits (Please refer to evaluation for details):  ADL's;IADL's;Work;Leisure    Body Structure / Function / Physical Skills  ADL;Coordination;GMC;UE functional use;Balance;Decreased knowledge of use of DME;Flexibility;IADL;Dexterity;FMC;Proprioception;Strength;Edema;ROM    Cognitive Skills  Memory    Psychosocial Skills  Environmental  Adaptations;Habits;Routines and Behaviors    Rehab Potential  Good    Clinical Decision Making  Limited treatment options, no task modification necessary    Modification or Assistance to Complete Evaluation   No modification of tasks or assist necessary to complete eval    OT Frequency  2x / week    OT Duration  6 weeks    OT Treatment/Interventions  Self-care/ADL training;Cryotherapy;Therapeutic exercise;DME and/or AE instruction;Functional Mobility Training;Cognitive remediation/compensation;Balance training;Electrical Stimulation;Neuromuscular education;Manual Therapy;Splinting;Moist Heat;Contrast Bath;Therapeutic activities;Patient/family education;Passive range  of motion    Consulted and Agree with Plan of Care  Patient       Patient will benefit from skilled therapeutic intervention in order to improve the following deficits and impairments:   Body Structure / Function / Physical Skills: ADL, Coordination, GMC, UE functional use, Balance, Decreased knowledge of use of DME, Flexibility, IADL, Dexterity, FMC, Proprioception, Strength, Edema, ROM Cognitive Skills: Memory Psychosocial Skills: Environmental  Adaptations, Habits, Routines and Behaviors   Visit Diagnosis: Muscle weakness (generalized)  Hemiplegia and hemiparesis following  cerebral infarction affecting right dominant side (HCC)  Other lack of coordination  Unsteadiness on feet    Problem List Patient Active Problem List   Diagnosis Date Noted  . Mixed hyperlipidemia 09/28/2018  . Middle cerebral artery embolism, left 04/18/2018  . History of stroke 04/17/2018  . IFG (impaired fasting glucose) 03/28/2018  . Essential hypertension, benign 01/15/2017  . Asthma 01/15/2017   Kristin Solis, OTR/L, CLT  Kristin Solis 01/16/2019, 6:07 PM  [facility] MAIN [facility] SERVICES [address] [place], [place], [address] Phone: [phone]   Fax:  [phone]  Name: Kristin Solis MRN: [id number] Date of Birth: 1956-02-04

## 2019-01-18 ENCOUNTER — Encounter: Payer: Self-pay | Admitting: Physical Therapy

## 2019-01-18 ENCOUNTER — Encounter: Payer: Self-pay | Admitting: Occupational Therapy

## 2019-01-18 ENCOUNTER — Other Ambulatory Visit: Payer: Self-pay

## 2019-01-18 ENCOUNTER — Ambulatory Visit: Payer: Medicaid Other | Admitting: Physical Therapy

## 2019-01-18 ENCOUNTER — Ambulatory Visit: Payer: Medicaid Other | Attending: Nurse Practitioner | Admitting: Occupational Therapy

## 2019-01-18 DIAGNOSIS — Z8673 Personal history of transient ischemic attack (TIA), and cerebral infarction without residual deficits: Secondary | ICD-10-CM | POA: Insufficient documentation

## 2019-01-18 DIAGNOSIS — M6281 Muscle weakness (generalized): Secondary | ICD-10-CM

## 2019-01-18 DIAGNOSIS — I69351 Hemiplegia and hemiparesis following cerebral infarction affecting right dominant side: Secondary | ICD-10-CM | POA: Insufficient documentation

## 2019-01-18 DIAGNOSIS — R600 Localized edema: Secondary | ICD-10-CM

## 2019-01-18 DIAGNOSIS — R278 Other lack of coordination: Secondary | ICD-10-CM

## 2019-01-18 DIAGNOSIS — R2689 Other abnormalities of gait and mobility: Secondary | ICD-10-CM | POA: Insufficient documentation

## 2019-01-18 DIAGNOSIS — R2681 Unsteadiness on feet: Secondary | ICD-10-CM | POA: Diagnosis not present

## 2019-01-18 DIAGNOSIS — H524 Presbyopia: Secondary | ICD-10-CM | POA: Diagnosis not present

## 2019-01-18 NOTE — Therapy (Signed)
[facility] MAIN REHAB SERVICES [address] [place], [place], [address] Phone: [phone]   Fax:  [phone]  Physical Therapy Treatment  Physical Therapy Progress Note   Dates of reporting period  12/13/18  to  01/18/19  Patient Details  Name: Kristin Solis MRN: [id number] Date of Birth: 10/09/1955 Referring Provider (PT): Jojene [name]   Encounter Date: 01/18/2019  PT End of Session - 01/18/19 1408    Visit Number  10    Number of Visits  25    Date for PT Re-Evaluation  03/07/19    Authorization Type  eval 10/27    Authorization Time Period  approved 3 visist 11/4-11/13, 8 visits aproved ( 01/02/19-12/13)    Authorization - Visit Number  6    Authorization - Number of Visits  8    PT Start Time  0145    PT Stop Time  0230    PT Time Calculation (min)  45 min    Equipment Utilized During Treatment  Gait belt    Activity Tolerance  Patient tolerated treatment well;No increased pain;Patient limited by fatigue    Behavior During Therapy  WFL for tasks assessed/performed       Past Medical History:  Diagnosis Date  . Asthma   . Hypertension   . Stroke (HCC)     Past Surgical History:  Procedure Laterality Date  . ABDOMINAL HYSTERECTOMY  2005   total  . IR CT HEAD LTD  04/18/2018  . IR INTRAVSC STENT CERV CAROTID W/O EMB-PROT MOD SED INC ANGIO  04/18/2018  . IR PERCUTANEOUS ART THROMBECTOMY/INFUSION INTRACRANIAL INC DIAG ANGIO  04/18/2018  . OOPHORECTOMY    . RADIOLOGY WITH ANESTHESIA N/A 04/17/2018   Procedure: RADIOLOGY WITH ANESTHESIA;  Surgeon: Deveshwar, Sanjeev, MD;  Location: [facility] OR;  Service: Radiology;  Laterality: N/A;    There were no vitals filed for this visit.  Subjective Assessment - 01/18/19 1407    Subjective  Pt doing well this date. Still reports minimal walking at home untless at the store. No medical updates this date.    Patient is accompained by:  Family member    Pertinent History  Patient is a 63 y.o.  female with history of stroke receiving care previously from [facility]. She was initially admitted to ED on 04/17/18 with dysphagia and right sided weakness s/p left MCA CVA, extubated on 04/19/18 and discharged on 04/23/18 to private residence. Was independent with ADLs/IADLs prior to admission. Inpatient PT reported decreased safety awareness and awareness of deficits, with deficits in the areas of balance, ambulation ability, transfers. Was able to ambulate 125 feet in hospital with min guard without AD, and independent with bed mobility.    Limitations  Lifting;Standing;Walking;Writing;House hold activities    How long can you sit comfortably?  As long as desired    How long can you stand comfortably?  5 minutes    How long can you walk comfortably?  Able to grocery shop, requires a seated rest break for a couple of minutes.    Patient Stated Goals  Wants to be able to use right arm and hand, and to feel more confortable with going shopping with her husband.    Currently in Pain?  No/denies    Pain Score  0-No pain    Pain Onset  Today         [facility] PT Assessment - 01/18/19 0001      Functional Gait  Assessment     Gait Level Surface  Walks 20 ft in less than 7 sec but greater than 5.5 sec, uses assistive device, slower speed, mild gait deviations, or deviates 6-10 in outside of the 12 in walkway width.    Change in Gait Speed  Able to change speed, demonstrates mild gait deviations, deviates 6-10 in outside of the 12 in walkway width, or no gait deviations, unable to achieve a major change in velocity, or uses a change in velocity, or uses an assistive device.    Gait with Horizontal Head Turns  Performs head turns smoothly with slight change in gait velocity (eg, minor disruption to smooth gait path), deviates 6-10 in outside 12 in walkway width, or uses an assistive device.    Gait with Vertical Head Turns  Performs task with slight change in gait velocity (eg, minor disruption  to smooth gait path), deviates 6 - 10 in outside 12 in walkway width or uses assistive device    Gait and Pivot Turn  Pivot turns safely in greater than 3 sec and stops with no loss of balance, or pivot turns safely within 3 sec and stops with mild imbalance, requires small steps to catch balance.    Step Over Obstacle  Is able to step over one shoe box (4.5 in total height) but must slow down and adjust steps to clear box safely. May require verbal cueing.    Gait with Narrow Base of Support  Ambulates less than 4 steps heel to toe or cannot perform without assistance.    Gait with Eyes Closed  Walks 20 ft, slow speed, abnormal gait pattern, evidence for imbalance, deviates 10-15 in outside 12 in walkway width. Requires more than 9 sec to ambulate 20 ft.    Ambulating Backwards  Cannot walk 20 ft without assistance, severe gait deviations or imbalance, deviates greater than 15 in outside 12 in walkway width or will not attempt task.    Steps  Two feet to a stair, must use rail.    Total Score  13        Therapeutic activities; FGA, 10 MW, 6 MW, steps and strength testing performed to assess goals today.                   PT Education - 01/18/19 1407    Education Details  HEP    Person(s) Educated  Patient    Methods  Explanation;Demonstration;Tactile cues;Verbal cues    Comprehension  Verbalized understanding;Returned demonstration;Verbal cues required;Tactile cues required;Need further instruction       PT Short Term Goals - 01/18/19 1408      PT SHORT TERM GOAL #1   Title  Patient will be independent in initial home exercise program to improve strength/mobility for better functional independence with ADLs.    Baseline  Not made yet., 12/28/18: given a HEP but still needs cues for proper technique    Time  6    Period  Weeks    Status  Partially Met    Target Date  01/24/19      PT SHORT TERM GOAL #2   Title  Patient (> 11 years old) will complete five times sit to  stand test in < 15 seconds indicating an increased LE strength and improved balance for decreased falls risk.    Baseline  12/13/18: 17.52 sec; 12/28/18: 13.53 sec without HHA    Time  6    Period  Weeks    Status  Achieved    Target  Date  01/24/19        PT Long Term Goals - 01/18/19 1408      PT LONG TERM GOAL #1   Title  Patient will increase 10 meter walk test to >1.0m/s as to improve gait speed for better community ambulation and to reduce fall risk.    Baseline  12/13/18: 0.64 m/s with rollator, 0.72 m/s without AD; 12/28/18: 0.84 m/s without AD 11/25: 1.0 m/s without AD    Time  12    Period  Weeks    Status  Achieved      PT LONG TERM GOAL #2   Title  Patient will increase six minute walk test distance to >1000 for progression to community ambulator and improve gait ability.    Baseline  12/13/18: 610 ft with rollator; 12/28/18: deferred 11/25: 723 ft without an AD, 01/18/19 640    Time  12    Period  Weeks    Status  Partially Met    Target Date  03/07/19      PT LONG TERM GOAL #3   Title  Patient will be require no assist with ascend/descend 3 steps using Least restrictive assistive device without LOB to improve ability to get in/out of home.    Baseline  Dependent on rail assist to reach front door. 12/28/18: uses rail, one step at a time 11/25: able to ascend/descend step over step with BUE support, able to perform step to pattern with SUE support12/2/20=able to perform step to pattern with SUE support12    Time  12    Period  Weeks    Status  Partially Met    Target Date  01/18/19      PT LONG TERM GOAL #4   Title  Patient will increase BLE gross strength to 4+/5 as to improve functional strength for independent gait, increased standing tolerance and increased ADL ability.    Baseline  12/13/18: RLE hip weakness; 12/28/18: RLE hip extension: 2+/5, hip abduction/adduction 3-/5, knee grossly 4-/5 11/25: RLE hip extension 3-/5 add/abd 3/5 knee 4/512/2/20=RLE hip  extension 3-/5 add/abd 3/5 knee 4/5    Time  12    Period  Weeks    Status  Partially Met      PT LONG TERM GOAL #5   Title  Patient will increase Functional Gait Assessment score to >20/30 as to reduce fall risk and improve dynamic gait safety with community ambulation.    Baseline  12/28/18: 13/24, 01/18/19 13/30    Time  12    Period  Weeks    Status  New    Target Date  03/06/18            Plan - 01/18/19 1435    Clinical Impression Statement Patient's condition has the potential to improve in response to therapy. Maximum improvement is yet to be obtained. The anticipated improvement is attainable and reasonable in a generally predictable time.  Patient reports that she is doing her HEP. She continues to have decreased gait speed, decreased FGA , decreased 6 MW and decreased static and dynamic standing balance. She will continue to benefit from skilled PT to improve mobility, strength and safety.    Rehab Potential  Good    PT Frequency  2x / week    PT Duration  12 weeks    PT Treatment/Interventions  ADLs/Self Care Home Management;Aquatic Therapy;Cryotherapy;Electrical Stimulation;Moist Heat;DME Instruction;Gait training;Stair training;Functional mobility training;Therapeutic activities;Therapeutic exercise;Balance training;Neuromuscular re-education;Patient/family education;Orthotic Fit/Training;Manual techniques;Compression bandaging;Passive range of motion;Dry needling;Energy conservation;Taping      PT Next Visit Plan  review HEP, balance    PT Home Exercise Plan  No updates this date    Consulted and Agree with Plan of Care  Patient       Patient will benefit from skilled therapeutic intervention in order to improve the following deficits and impairments:  Abnormal gait, Decreased balance, Decreased endurance, Decreased mobility, Difficulty walking, Hypomobility, Decreased range of motion, Improper body mechanics, Decreased activity tolerance, Decreased strength, Impaired UE  functional use, Postural dysfunction  Visit Diagnosis: Muscle weakness (generalized)  Hemiplegia and hemiparesis following cerebral infarction affecting right dominant side (HCC)  Other lack of coordination  Unsteadiness on feet  Other abnormalities of gait and mobility  History of stroke  Localized edema     Problem List Patient Active Problem List   Diagnosis Date Noted  . Mixed hyperlipidemia 09/28/2018  . Middle cerebral artery embolism, left 04/18/2018  . History of stroke 04/17/2018  . IFG (impaired fasting glucose) 03/28/2018  . Essential hypertension, benign 01/15/2017  . Asthma 01/15/2017    Alanson Puls, PT DPT 01/18/2019, 2:36 PM  [place] MAIN [facility] SERVICES [address] [place], [place], [address] Phone: [phone]   Fax:  [phone]  Name: Kristin Solis MRN: [id number] Date of Birth: Jun 19, 1955

## 2019-01-19 NOTE — Therapy (Signed)
[facility] [address] [place], [place], [address] Phone: [phone]   Fax:  [phone]  Occupational Therapy Treatment  Patient Details  Name: Kristin Solis MRN: [id number] Date of Birth: 24-Oct-1955 Referring Provider (OT): Aura Dialsannady, Jolene   Encounter Date: 01/18/2019  OT End of Session - 01/18/19 1313    Visit Number  8    Number of Visits  13    Date for OT Re-Evaluation  02/03/19    OT Start Time  1302    OT Stop Time  1345    OT Time Calculation (min)  43 min    Activity Tolerance  Patient tolerated treatment well    Behavior During Therapy  [facility] for tasks assessed/performed       Past Medical History:  Diagnosis Date  . Asthma   . Hypertension   . Stroke [facility](HCC)     Past Surgical History:  Procedure Laterality Date  . ABDOMINAL HYSTERECTOMY  2005   total  . IR CT HEAD LTD  04/18/2018  . IR INTRAVSC STENT CERV CAROTID W/O EMB-PROT MOD SED INC ANGIO  04/18/2018  . IR PERCUTANEOUS ART THROMBECTOMY/INFUSION INTRACRANIAL INC DIAG ANGIO  04/18/2018  . OOPHORECTOMY    . RADIOLOGY WITH ANESTHESIA N/A 04/17/2018   Procedure: RADIOLOGY WITH ANESTHESIA;  Surgeon: Julieanne Cottoneveshwar, Sanjeev, MD;  Location: [facility] OR;  Service: Radiology;  Laterality: N/A;    There were no vitals filed for this visit.  Subjective Assessment - 01/18/19 1312    Subjective   Patient reports she fed herself with a fork and built up foam handle the other day and got through the whole meal using right hand.    Pertinent History  Patient is a 63 y.o. female with history of stroke,  admitted to ED on 04/17/18 with dysphagia and right sided weakness s/p left MCA CVA, extubated on 04/19/18 and discharged on 04/23/18 to private residence    Patient Stated Goals  Patient reports she would like to get her right hand to move more and be able to use for daily tasks.    Currently in Pain?  No/denies    Pain Score  0-No pain       Contrast Contrast to RUE hand and  wrist prior to exercises and ROMfor 11 mins, alternating warm and cold to decrease edema and prepare hand for therex.    Therapeutic Exercises: Following contrast, PROM to right hand for finger flexion, able to achieve full fist after prolonged stretching. Active ROM of right hand for finger flexion and extension as well as gross grasping patterns to pick up large items.   Mat Exercises in supine after PROM of hand Joint mobs to right shoulder for scapular elevation, depression, upwards rotation followed by posterior and inferior joint mobs [facility] to ROM to shoulder.  Pain 8/10 at the shoulder prior to joint mobs, decreased to 3/10 after mobs.  Shoulder flexion to 80 degrees, ABD to 85 degrees passively and then attempts at active ROM.     Response to tx:  Patient with increased shoulder pain this date with flexion and return from flexion limiting her reaching patterns and ability to use right UE.  Pain decreased after joint mobs and ROM.  Patient continues to progress with right hand with flexion and extension.  Grasping patterns improving and patient was able to self feed with use of built up red foam on utensils this week.  Continue OT to  increase RUE functional use for ADL tasks and IADL tasks.                      OT Education - 01/18/19 1312    Education Details  continued grasping patterns with right    Person(s) Educated  Patient    Methods  Explanation;Demonstration    Comprehension  Verbalized understanding;Returned demonstration          OT Long Term Goals - 12/22/18 1841      OT LONG TERM GOAL #1   Title  Patient will complete home exercise program with modified independence.    Baseline  no current program    Time  6    Period  Weeks    Status  New    Target Date  02/03/19      OT LONG TERM GOAL #2   Title  Patient will demonstrate understanding of Edema control measures for her right upper extremity and implement into  daily routine.    Baseline  has not been educated on edema control    Time  3    Period  Weeks    Status  New    Target Date  01/12/19      OT LONG TERM GOAL #3   Title  Patient will demonstrate improved grip by 5 # in right Hand to be able to hold items in hand with gross grasp.    Baseline  2# on right, unable to grasp items    Time  6    Period  Weeks    Status  New    Target Date  02/03/19      OT LONG TERM GOAL #4   Title  Patient will increase range of motion of fingers in right hand to be able to grasp built-up utensils for self feeding with modified independence.    Baseline  unable to feed self or hold utensils with right hand    Time  6    Period  Months    Status  New    Target Date  02/03/19      OT LONG TERM GOAL #5   Title  Patient will improve right hand function to be able to grasp pen to sign name on important papers with fair legibility.    Baseline  unable to hold pen or sign name    Time  6    Period  Weeks    Status  New    Target Date  02/03/19      Long Term Additional Goals   Additional Long Term Goals  Yes      OT LONG TERM GOAL #6   Title  Patient will hold a cup with modified independence.    Baseline  unable at eval with right hand    Time  6    Period  Weeks    Status  New    Target Date  02/03/19      OT LONG TERM GOAL #7   Title  Patient will improve right hand function to manipulate buttons, snaps, zippers with modified independence.    Baseline  difficulty at eval and requires assist at times.    Time  6    Period  Weeks    Status  New    Target Date  02/03/19            Plan - 01/18/19 1314    Clinical Impression Statement  Patient with increased shoulder  pain this date with flexion and return from flexion limiting her reaching patterns and ability to use right UE.  Pain decreased after joint mobs and ROM.  Patient continues to progress with right hand with flexion and extension.  Grasping patterns improving and patient was  able to self feed with use of built up red foam on utensils this week.  Continue OT to increase RUE functional use for ADL tasks and IADL tasks.    OT Occupational Profile and History  Detailed Assessment- Review of Records and additional review of physical, cognitive, psychosocial history related to current functional performance    Occupational performance deficits (Please refer to evaluation for details):  ADL's;IADL's;Work;Leisure    Body Structure / Function / Physical Skills  ADL;Coordination;GMC;UE functional use;Balance;Decreased knowledge of use of DME;Flexibility;IADL;Dexterity;FMC;Proprioception;Strength;Edema;ROM    Cognitive Skills  Memory    Psychosocial Skills  Environmental  Adaptations;Habits;Routines and Behaviors    Rehab Potential  Good    Clinical Decision Making  Limited treatment options, no task modification necessary    Comorbidities Affecting Occupational Performance:  May have comorbidities impacting occupational performance    Modification or Assistance to Complete Evaluation   No modification of tasks or assist necessary to complete eval    OT Frequency  2x / week    OT Duration  6 weeks    OT Treatment/Interventions  Self-care/ADL training;Cryotherapy;Therapeutic exercise;DME and/or AE instruction;Functional Mobility Training;Cognitive remediation/compensation;Balance training;Electrical Stimulation;Neuromuscular education;Manual Therapy;Splinting;Moist Heat;Contrast Bath;Therapeutic activities;Patient/family education;Passive range of motion    Consulted and Agree with Plan of Care  Patient       Patient will benefit from skilled therapeutic intervention in order to improve the following deficits and impairments:   Body Structure / Function / Physical Skills: ADL, Coordination, GMC, UE functional use, Balance, Decreased knowledge of use of DME, Flexibility, IADL, Dexterity, FMC, Proprioception, Strength, Edema, ROM Cognitive Skills: Memory Psychosocial Skills:  Environmental  Adaptations, Habits, Routines and Behaviors   Visit Diagnosis: Muscle weakness (generalized)  Hemiplegia and hemiparesis following cerebral infarction affecting right dominant side (HCC)  Other lack of coordination    Problem List Patient Active Problem List   Diagnosis Date Noted  . Mixed hyperlipidemia 09/28/2018  . Middle cerebral artery embolism, left 04/18/2018  . History of stroke 04/17/2018  . IFG (impaired fasting glucose) 03/28/2018  . Essential hypertension, benign 01/15/2017  . Asthma 01/15/2017   Romeo Zielinski T Tomasita Morrow, OTR/L, CLT  Emmerson Taddei 01/19/2019, 7:43 PM  [place] MAIN [facility] SERVICES [address] [place], [place], [address] Phone: [phone]   Fax:  [phone]  Name: Kristin Solis MRN: [id number] Date of Birth: 1955/10/26

## 2019-01-20 ENCOUNTER — Ambulatory Visit: Payer: Self-pay | Admitting: *Deleted

## 2019-01-20 DIAGNOSIS — I10 Essential (primary) hypertension: Secondary | ICD-10-CM

## 2019-01-20 DIAGNOSIS — Z8673 Personal history of transient ischemic attack (TIA), and cerebral infarction without residual deficits: Secondary | ICD-10-CM

## 2019-01-20 NOTE — Chronic Care Management (AMB) (Signed)
  Chronic Care Management   Follow Up Note   01/20/2019 Name: Kristin Solis MRN: [id number] DOB: 23-Dec-1955  Referred by: [name], NP Reason for referral : Chronic Care Management (HTN,  PT)   Kristin Solis is a 63 y.o. year old female who is a primary care patient of [name], [name], NP. The CCM team was consulted for assistance with chronic disease management and care coordination needs.    Review of patient status, including review of consultants reports, relevant laboratory and other test results, and collaboration with appropriate care team members and the patient's provider was performed as part of comprehensive patient evaluation and provision of chronic care management services.    SDOH (Social Determinants of Health) screening performed today: Financial Strain  Physical Activity. See Care Plan for related entries.   Outpatient Encounter Medications as of 01/20/2019  Medication Sig  . albuterol (VENTOLIN HFA) 108 (90 Base) MCG/ACT inhaler Inhale 2 puffs into the lungs every 6 (six) hours as needed for wheezing or shortness of breath.  [name] aspirin EC 81 MG EC tablet Take 1 tablet (81 mg total) by mouth daily.  [name] atorvastatin (LIPITOR) 40 MG tablet Take 1 tablet (40 mg total) by mouth daily at 6 PM.  . fluticasone (FLONASE) 50 MCG/ACT nasal spray Place 2 sprays into both nostrils daily.  . Fluticasone-Salmeterol (ADVAIR DISKUS) 250-50 MCG/DOSE AEPB Inhale 1 puff into the lungs 2 (two) times daily.  [name] lisinopril (ZESTRIL) 5 MG tablet Take 1 tablet (5 mg total) by mouth daily.   No facility-administered encounter medications on file as of 01/20/2019.      Goals Addressed            This Visit's Progress   . RN-"I need help getting therapy" (pt-stated)       Current Barriers:  [name] Knowledge Deficits related to options for therapy post CVa . Film/video editor.   Nurse Case Manager Clinical Goal(s):  [name] Over the next 30 days, patient will verbalize understanding  of plan for outpatient therapy options  Interventions:  . Patient excited about the progress she has made with PT/OT- Patient is continuing to participated and reports good compliance with at home exercises.  . Patient reporting good blood pressure control- B/P today 146/76 . Patient stating she has filled out all forms necessary for patient assistance with Advair and albuterol and returned them.     Patient Self Care Activities:  . Currently UNABLE TO independently perform ADL's including self care activities such as bathing, dressing because of stroke deficits   Plan:  . RNCM will follow up with patient over the next 30 days to ensure initiation of services or options provided by outpatient PT   Please see past updates related to this goal by clicking on the "Past Updates" button in the selected goal          The care management team will reach out to the patient again over the next 30 days.  The patient has been provided with contact information for the care management team and has been advised to call with any health related questions or concerns.    [name] Tanice Petre RN, BSN Nurse Case Editor, commissioning Family Practice/[facility] Care Management  [phone]) Business Mobile

## 2019-01-20 NOTE — Patient Instructions (Signed)
Thank you allowing the Chronic Care Management Team to be a part of your care! It was a pleasure speaking with you today!  CCM (Chronic Care Management) Team   Mathis Cashman RN, BSN Nurse Care Coordinator  [phone]  Catie [facility] PharmD  Clinical Pharmacist  [phone]  [name] LCSW Clinical Social Worker [phone]  Goals Addressed            This Visit's Progress   . RN-"I need help getting therapy" (pt-stated)       Current Barriers:  [name] Knowledge Deficits related to options for therapy post CVa . Film/video editor.   Nurse Case Manager Clinical Goal(s):  [name] Over the next 30 days, patient will verbalize understanding of plan for outpatient therapy options  Interventions:  . Patient excited about the progress she has made with PT/OT- Patient is continuing to participated and reports good compliance with at home exercises.  . Patient reporting good blood pressure control- B/P today 146/76 . Patient stating she has filled out all forms necessary for patient assistance with Advair and albuterol and returned them.     Patient Self Care Activities:  . Currently UNABLE TO independently perform ADL's including self care activities such as bathing, dressing because of stroke deficits   Plan:  . RNCM will follow up with patient over the next 30 days to ensure initiation of services or options provided by outpatient PT   Please see past updates related to this goal by clicking on the "Past Updates" button in the selected goal         The patient verbalized understanding of instructions provided today and declined a print copy of patient instruction materials.   The patient has been provided with contact information for the care management team and has been advised to call with any health related questions or concerns.

## 2019-01-23 ENCOUNTER — Ambulatory Visit: Payer: Medicaid Other

## 2019-01-23 ENCOUNTER — Encounter: Payer: Self-pay | Admitting: Physical Therapy

## 2019-01-23 ENCOUNTER — Ambulatory Visit: Payer: Medicaid Other | Admitting: Occupational Therapy

## 2019-01-23 ENCOUNTER — Encounter: Payer: Self-pay | Admitting: Occupational Therapy

## 2019-01-23 ENCOUNTER — Other Ambulatory Visit: Payer: Self-pay

## 2019-01-23 DIAGNOSIS — R600 Localized edema: Secondary | ICD-10-CM | POA: Diagnosis not present

## 2019-01-23 DIAGNOSIS — Z8673 Personal history of transient ischemic attack (TIA), and cerebral infarction without residual deficits: Secondary | ICD-10-CM | POA: Diagnosis not present

## 2019-01-23 DIAGNOSIS — I69351 Hemiplegia and hemiparesis following cerebral infarction affecting right dominant side: Secondary | ICD-10-CM

## 2019-01-23 DIAGNOSIS — R278 Other lack of coordination: Secondary | ICD-10-CM

## 2019-01-23 DIAGNOSIS — M6281 Muscle weakness (generalized): Secondary | ICD-10-CM

## 2019-01-23 DIAGNOSIS — R2689 Other abnormalities of gait and mobility: Secondary | ICD-10-CM

## 2019-01-23 DIAGNOSIS — R2681 Unsteadiness on feet: Secondary | ICD-10-CM | POA: Diagnosis not present

## 2019-01-23 NOTE — Therapy (Signed)
[place] MAIN [facility] SERVICES [address] [place], [place], [address] Phone: [phone]   Fax:  [phone]  Physical Therapy Treatment  Patient Details  Name: Kristin Solis MRN: [id number] Date of Birth: 30-Jul-1955 Referring Provider (PT): [name]   Encounter Date: 01/23/2019  PT End of Session - 01/23/19 1553    Visit Number  11    Number of Visits  25    Date for PT Re-Evaluation  03/07/19    Authorization Type  eval 10/27    Authorization Time Period  approved 3 visist 11/4-11/13, 8 visits aproved ( 01/02/19-12/13)    Authorization - Visit Number  7    Authorization - Number of Visits  8    PT Start Time  0630    PT Stop Time  1600    PT Time Calculation (min)  45 min    Equipment Utilized During Treatment  Gait belt    Activity Tolerance  Patient tolerated treatment well;No increased pain;Patient limited by fatigue    Behavior During Therapy  [facility] for tasks assessed/performed       Past Medical History:  Diagnosis Date  . Asthma   . Hypertension   . Stroke [facility])     Past Surgical History:  Procedure Laterality Date  . ABDOMINAL HYSTERECTOMY  2005   total  . IR CT HEAD LTD  04/18/2018  . IR INTRAVSC STENT CERV CAROTID W/O EMB-PROT MOD SED INC ANGIO  04/18/2018  . IR PERCUTANEOUS ART THROMBECTOMY/INFUSION INTRACRANIAL INC DIAG ANGIO  04/18/2018  . OOPHORECTOMY    . RADIOLOGY WITH ANESTHESIA N/A 04/17/2018   Procedure: RADIOLOGY WITH ANESTHESIA;  Surgeon: Luanne Bras, MD;  Location: [place];  Service: Radiology;  Laterality: N/A;    There were no vitals filed for this visit.  Subjective Assessment - 01/23/19 1516    Subjective  Patient reported that she is doing well today, no falls or stumbles. Pt reported that she is doing her HEP 1-2x a day.    Pertinent History  Patient is a 63 y.o. female with history of stroke receiving care previously from [facility] at [facility] She was initially admitted to ED on  04/17/18 with dysphagia and right sided weakness s/p left MCA CVA, extubated on 04/19/18 and discharged on 04/23/18 to private residence. Was independent with ADLs/IADLs prior to admission. Inpatient PT reported decreased safety awareness and awareness of deficits, with deficits in the areas of balance, ambulation ability, transfers. Was able to ambulate 125 feet in hospital with min guard without AD, and independent with bed mobility.    Limitations  Lifting;Standing;Walking;Writing;House hold activities    How long can you sit comfortably?  As long as desired    How long can you walk comfortably?  Able to grocery shop, requires a seated rest break for a couple of minutes.    Patient Stated Goals  Wants to be able to use right arm and hand, and to feel more confortable with going shopping with her husband.    Currently in Pain?  No/denies        INTERVENTION THIS DATE: TREATMENT: nustep x 40mns (unbilled) Therapeutic Exercise -STS from chair hands free 2x10 -standing knee flexion 2x10, 2lb AW -standing hip ABDCT 2lb AW x10 bilat (cues to avoid hip-flexor-dominant movement pattern and better gluteal engagement)  -standing hip extension 2x10 bilat     Neuromuscular   -AMB in gym obstacle course cones agility ladder 4 way and sudden stops and turns  -  SLS cone taps 3 cone semi circle set up, L returning to R cycles, bilaterally x3 ea side intermittent UE support for LOB recovery.  Balloon taps x2 minutes with predominantly LUE, difficulty with RUE ROM  Soccer ball activating/timing/recruitment ,between two goal for spatial awareness and coordination: 1x10 bilat without UE support    Patient response/clinical impression: The patient demonstrated excellent motivation for therapy this session. Pt limited by SOB/fatigue with standing exercises towards end of session, and intermittent sitting rest breaks needed (pt also complained of hip pain with prolonged standing/ambulation). The patient would  benefit from further skilled PT intervention to maximize mobility ,safety, and independence.    PT Education - 01/23/19 1517    Education Details  exercise form/technique    Person(s) Educated  Patient    Methods  Explanation;Demonstration;Tactile cues;Verbal cues    Comprehension  Verbalized understanding;Returned demonstration;Verbal cues required;Tactile cues required;Need further instruction       PT Short Term Goals - 01/18/19 1408      PT SHORT TERM GOAL #1   Title  Patient will be independent in initial home exercise program to improve strength/mobility for better functional independence with ADLs.    Baseline  Not made yet., 12/28/18: given a HEP but still needs cues for proper technique    Time  6    Period  Weeks    Status  Partially Met    Target Date  01/24/19      PT SHORT TERM GOAL #2   Title  Patient (> 48 years old) will complete five times sit to stand test in < 15 seconds indicating an increased LE strength and improved balance for decreased falls risk.    Baseline  12/13/18: 17.52 sec; 12/28/18: 13.53 sec without HHA    Time  6    Period  Weeks    Status  Achieved    Target Date  01/24/19        PT Long Term Goals - 01/18/19 1408      PT LONG TERM GOAL #1   Title  Patient will increase 10 meter walk test to >1.16ms as to improve gait speed for better community ambulation and to reduce fall risk.    Baseline  12/13/18: 0.64 m/s with rollator, 0.72 m/s without AD; 12/28/18: 0.84 m/s without AD 11/25: 1.0 m/s without AD    Time  12    Period  Weeks    Status  Achieved      PT LONG TERM GOAL #2   Title  Patient will increase six minute walk test distance to >1000 for progression to community ambulator and improve gait ability.    Baseline  12/13/18: 662ft with rollator; 12/28/18: deferred 11/25: 723 ft without an AD, 01/18/19 640    Time  12    Period  Weeks    Status  Partially Met    Target Date  03/07/19      PT LONG TERM GOAL #3   Title  Patient  will be require no assist with ascend/descend 3 steps using Least restrictive assistive device without LOB to improve ability to get in/out of home.    Baseline  Dependent on rail assist to reach front door. 12/28/18: uses rail, one step at a time 11/25: able to ascend/descend step over step with BUE support, able to perform step to pattern with SUE support12/2/20=able to perform step to pattern with SUE support12    Time  12    Period  Weeks  Status  Partially Met    Target Date  01/18/19      PT LONG TERM GOAL #4   Title  Patient will increase BLE gross strength to 4+/5 as to improve functional strength for independent gait, increased standing tolerance and increased ADL ability.    Baseline  12/13/18: RLE hip weakness; 12/28/18: RLE hip extension: 2+/5, hip abduction/adduction 3-/5, knee grossly 4-/5 11/25: RLE hip extension 3-/5 add/abd 3/5 knee 4/512/2/20=RLE hip extension 3-/5 add/abd 3/5 knee 4/5    Time  12    Period  Weeks    Status  Partially Met      PT LONG TERM GOAL #5   Title  Patient will increase Functional Gait Assessment score to >20/30 as to reduce fall risk and improve dynamic gait safety with community ambulation.    Baseline  12/28/18: 13/24, 01/18/19 13/30    Time  12    Period  Weeks    Status  New    Target Date  03/06/18            Plan - 01/23/19 1553    Clinical Impression Statement  The patient demonstrated excellent motivation for therapy this session. Pt limited by SOB/fatigue with standing exercises towards end of session, and intermittent sitting rest breaks needed (pt also complained of hip pain with prolonged standing/ambulation). The patient would benefit from further skilled PT intervention to maximize mobility ,safety, and independence.    Personal Factors and Comorbidities  Age;Comorbidity 2;Fitness;Time since onset of injury/illness/exacerbation    Comorbidities  HTN, Hx of stroke    Examination-Activity Limitations  Bathing;Bed  Mobility;Bend;Carry;Dressing;Lift;Locomotion Level;Reach Overhead;Squat;Stairs;Stand;Transfers    Examination-Participation Restrictions  Cleaning;Community Activity;Driving;Laundry;Shop;Yard Work    Rehab Potential  Good    PT Frequency  2x / week    PT Duration  12 weeks    PT Treatment/Interventions  ADLs/Self Care Home Management;Aquatic Therapy;Cryotherapy;Electrical Stimulation;Moist Heat;DME Instruction;Gait training;Stair training;Functional mobility training;Therapeutic activities;Therapeutic exercise;Balance training;Neuromuscular re-education;Patient/family education;Orthotic Fit/Training;Manual techniques;Compression bandaging;Passive range of motion;Dry needling;Energy conservation;Taping    PT Next Visit Plan  review HEP, balance    PT Home Exercise Plan  No updates this date    Consulted and Agree with Plan of Care  Patient       Patient will benefit from skilled therapeutic intervention in order to improve the following deficits and impairments:  Abnormal gait, Decreased balance, Decreased endurance, Decreased mobility, Difficulty walking, Hypomobility, Decreased range of motion, Improper body mechanics, Decreased activity tolerance, Decreased strength, Impaired UE functional use, Postural dysfunction  Visit Diagnosis: Muscle weakness (generalized)  Hemiplegia and hemiparesis following cerebral infarction affecting right dominant side (HCC)  Other lack of coordination  Unsteadiness on feet  History of stroke  Localized edema  Other abnormalities of gait and mobility     Problem List Patient Active Problem List   Diagnosis Date Noted  . Mixed hyperlipidemia 09/28/2018  . Middle cerebral artery embolism, left 04/18/2018  . History of stroke 04/17/2018  . IFG (impaired fasting glucose) 03/28/2018  . Essential hypertension, benign 01/15/2017  . Asthma 01/15/2017    Lieutenant Diego PT, DPT 4:17 PM,01/24/19 [place] MAIN [facility] SERVICES [address] [place], [place], [address] Phone: [phone]   Fax:  [phone]  Name: Kristin Solis MRN: [id number] Date of Birth: Jan 27, 1956

## 2019-01-23 NOTE — Therapy (Signed)
[place] MAIN [facility] SERVICES [address] [place], [place], [address] Phone: [phone]   Fax:  [phone]  Occupational Therapy Treatment  Patient Details  Name: Kristin Solis MRN: [id number] Date of Birth: 05/19/1955 Referring Provider (OT): Marnee Guarneri   Encounter Date: 01/23/2019  OT End of Session - 01/23/19 1743    Visit Number  9    Number of Visits  13    Date for OT Re-Evaluation  02/03/19    OT Start Time  1430    OT Stop Time  1515    OT Time Calculation (min)  45 min    Activity Tolerance  Patient tolerated treatment well    Behavior During Therapy  [facility] for tasks assessed/performed       Past Medical History:  Diagnosis Date  . Asthma   . Hypertension   . Stroke [facility])     Past Surgical History:  Procedure Laterality Date  . ABDOMINAL HYSTERECTOMY  2005   total  . IR CT HEAD LTD  04/18/2018  . IR INTRAVSC STENT CERV CAROTID W/O EMB-PROT MOD SED INC ANGIO  04/18/2018  . IR PERCUTANEOUS ART THROMBECTOMY/INFUSION INTRACRANIAL INC DIAG ANGIO  04/18/2018  . OOPHORECTOMY    . RADIOLOGY WITH ANESTHESIA N/A 04/17/2018   Procedure: RADIOLOGY WITH ANESTHESIA;  Surgeon: Luanne Bras, MD;  Location: [place];  Service: Radiology;  Laterality: N/A;    There were no vitals filed for this visit.  Subjective Assessment - 01/23/19 1742    Subjective   Pt. reports that she is trying to do edema control technaiues at home.    Pertinent History  Patient is a 63 y.o. female with history of stroke,  admitted to ED on 04/17/18 with dysphagia and right sided weakness s/p left MCA CVA, extubated on 04/19/18 and discharged on 04/23/18 to private residence    Patient Stated Goals  Patient reports she would like to get her right hand to move more and be able to use for daily tasks.    Currently in Pain?  No/denies      OT TREATMENT    Pt. Tolerated heat and ice contrast for several reps of heat for 3 min, and ice for 1 min. To her right hand  for edema prior to manual therapy, and there. Ex. Pt. Tolerated retrograde massage to the right digit, hand, and wrist with soft tissue mobes with the forearm elevated. Manual techniques were performed prior to, and independent of there. Ex. Pt. Tolerated PROM to all digit MPs, PIPs, and DIPs of the right hand, followed by AROM, and intrinsic stretches in preparation for being able to formulate a fist.  Response to Treatment:  Pt. presented with increased edema in her right hand. Pt. tolerated edema control techniques, and right hand digit ROM well with a decrease in edema at the end of the sesison, as well as an improvement in ROM. Pt. continues to work on improving RUE ROM, functional grasp patterns, and functional hand use during ADLs, and IADL tasks.                       OT Education - 01/23/19 1743    Education Details  Edema control, and ROM    Person(s) Educated  Patient    Methods  Explanation;Demonstration    Comprehension  Verbalized understanding;Returned demonstration          OT Long Term Goals - 12/22/18 1841  OT LONG TERM GOAL #1   Title  Patient will complete home exercise program with modified independence.    Baseline  no current program    Time  6    Period  Weeks    Status  New    Target Date  02/03/19      OT LONG TERM GOAL #2   Title  Patient will demonstrate understanding of Edema control measures for her right upper extremity and implement into daily routine.    Baseline  has not been educated on edema control    Time  3    Period  Weeks    Status  New    Target Date  01/12/19      OT LONG TERM GOAL #3   Title  Patient will demonstrate improved grip by 5 # in right Hand to be able to hold items in hand with gross grasp.    Baseline  2# on right, unable to grasp items    Time  6    Period  Weeks    Status  New    Target Date  02/03/19      OT LONG TERM GOAL #4   Title  Patient will increase range of motion of fingers in  right hand to be able to grasp built-up utensils for self feeding with modified independence.    Baseline  unable to feed self or hold utensils with right hand    Time  6    Period  Months    Status  New    Target Date  02/03/19      OT LONG TERM GOAL #5   Title  Patient will improve right hand function to be able to grasp pen to sign name on important papers with fair legibility.    Baseline  unable to hold pen or sign name    Time  6    Period  Weeks    Status  New    Target Date  02/03/19      Long Term Additional Goals   Additional Long Term Goals  Yes      OT LONG TERM GOAL #6   Title  Patient will hold a cup with modified independence.    Baseline  unable at eval with right hand    Time  6    Period  Weeks    Status  New    Target Date  02/03/19      OT LONG TERM GOAL #7   Title  Patient will improve right hand function to manipulate buttons, snaps, zippers with modified independence.    Baseline  difficulty at eval and requires assist at times.    Time  6    Period  Weeks    Status  New    Target Date  02/03/19            Plan - 01/23/19 1744    Clinical Impression Statement Pt. presented with increased edema in her right hand. Pt. tolerated edema control techniques, and right hand digit ROM well with a decrease in edema at the end of the sesison, as well as an improvement in ROM. Pt. continues to work on improving RUE ROM, functional grasp patterns, and functional hand use during ADLs, and IADL tasks.    OT Occupational Profile and History  Detailed Assessment- Review of Records and additional review of physical, cognitive, psychosocial history related to current functional performance    Occupational performance deficits (Please refer to  evaluation for details):  ADL's;IADL's;Work;Leisure    Body Structure / Function / Physical Skills  ADL;Coordination;GMC;UE functional use;Balance;Decreased knowledge of use of  DME;Flexibility;IADL;Dexterity;FMC;Proprioception;Strength;Edema;ROM    Cognitive Skills  Memory    Psychosocial Skills  Environmental  Adaptations;Habits;Routines and Behaviors    Rehab Potential  Good    Clinical Decision Making  Limited treatment options, no task modification necessary    Comorbidities Affecting Occupational Performance:  May have comorbidities impacting occupational performance    Modification or Assistance to Complete Evaluation   No modification of tasks or assist necessary to complete eval    OT Frequency  2x / week    OT Duration  6 weeks    OT Treatment/Interventions  Self-care/ADL training;Cryotherapy;Therapeutic exercise;DME and/or AE instruction;Functional Mobility Training;Cognitive remediation/compensation;Balance training;Electrical Stimulation;Neuromuscular education;Manual Therapy;Splinting;Moist Heat;Contrast Bath;Therapeutic activities;Patient/family education;Passive range of motion    Consulted and Agree with Plan of Care  Patient       Patient will benefit from skilled therapeutic intervention in order to improve the following deficits and impairments:   Body Structure / Function / Physical Skills: ADL, Coordination, GMC, UE functional use, Balance, Decreased knowledge of use of DME, Flexibility, IADL, Dexterity, FMC, Proprioception, Strength, Edema, ROM Cognitive Skills: Memory Psychosocial Skills: Environmental  Adaptations, Habits, Routines and Behaviors   Visit Diagnosis: Muscle weakness (generalized)  Other lack of coordination    Problem List Patient Active Problem List   Diagnosis Date Noted  . Mixed hyperlipidemia 09/28/2018  . Middle cerebral artery embolism, left 04/18/2018  . History of stroke 04/17/2018  . IFG (impaired fasting glucose) 03/28/2018  . Essential hypertension, benign 01/15/2017  . Asthma 01/15/2017    Olegario Messier, MS, OTR/L 01/23/2019, 5:53 PM  [facility] MAIN [facility]  SERVICES [address] [place], [place], [address] Phone: [phone]   Fax:  [phone]  Name: Kristin Solis MRN: [id number] Date of Birth: [birth year]

## 2019-01-26 ENCOUNTER — Encounter: Payer: Self-pay | Admitting: Physical Therapy

## 2019-01-26 ENCOUNTER — Encounter: Payer: Self-pay | Admitting: Occupational Therapy

## 2019-01-26 ENCOUNTER — Ambulatory Visit: Payer: Medicaid Other | Admitting: Occupational Therapy

## 2019-01-26 ENCOUNTER — Ambulatory Visit: Payer: Medicaid Other | Admitting: Physical Therapy

## 2019-01-26 ENCOUNTER — Other Ambulatory Visit: Payer: Self-pay

## 2019-01-26 DIAGNOSIS — R278 Other lack of coordination: Secondary | ICD-10-CM | POA: Diagnosis not present

## 2019-01-26 DIAGNOSIS — I69351 Hemiplegia and hemiparesis following cerebral infarction affecting right dominant side: Secondary | ICD-10-CM

## 2019-01-26 DIAGNOSIS — R2689 Other abnormalities of gait and mobility: Secondary | ICD-10-CM | POA: Diagnosis not present

## 2019-01-26 DIAGNOSIS — M6281 Muscle weakness (generalized): Secondary | ICD-10-CM | POA: Diagnosis not present

## 2019-01-26 DIAGNOSIS — Z8673 Personal history of transient ischemic attack (TIA), and cerebral infarction without residual deficits: Secondary | ICD-10-CM | POA: Diagnosis not present

## 2019-01-26 DIAGNOSIS — R600 Localized edema: Secondary | ICD-10-CM

## 2019-01-26 DIAGNOSIS — R2681 Unsteadiness on feet: Secondary | ICD-10-CM | POA: Diagnosis not present

## 2019-01-26 NOTE — Therapy (Signed)
[place] MAIN [facility] SERVICES [address] [place], [place], [address] Phone: [phone]   Fax:  [phone]  Occupational Therapy Treatment/Progress update Reporting period from 12/21/2018 to 01/26/2019  Patient Details  Name: Kristin Solis MRN: [id number] Date of Birth: 1956-01-07 Referring Provider (OT): Marnee Guarneri   Encounter Date: 01/26/2019  OT End of Session - 01/26/19 2025    Visit Number  10    Number of Visits  13    Date for OT Re-Evaluation  02/03/19    OT Start Time  1145    OT Stop Time  1230    OT Time Calculation (min)  45 min    Activity Tolerance  Patient tolerated treatment well    Behavior During Therapy  [facility] for tasks assessed/performed       Past Medical History:  Diagnosis Date  . Asthma   . Hypertension   . Stroke [facility])     Past Surgical History:  Procedure Laterality Date  . ABDOMINAL HYSTERECTOMY  2005   total  . IR CT HEAD LTD  04/18/2018  . IR INTRAVSC STENT CERV CAROTID W/O EMB-PROT MOD SED INC ANGIO  04/18/2018  . IR PERCUTANEOUS ART THROMBECTOMY/INFUSION INTRACRANIAL INC DIAG ANGIO  04/18/2018  . OOPHORECTOMY    . RADIOLOGY WITH ANESTHESIA N/A 04/17/2018   Procedure: RADIOLOGY WITH ANESTHESIA;  Surgeon: Luanne Bras, MD;  Location: [place];  Service: Radiology;  Laterality: N/A;    There were no vitals filed for this visit.  Subjective Assessment - 01/26/19 2023    Subjective   No pain with right shoulder at rest but with movement.  Reports she has been working on raising the arm to reach to help wash her hair.  Was able to do it yesterday partially, some difficulty with reaching to head today.  Would like to work on shoulder range of motion.    Pertinent History  Patient is a 63 y.o. female with history of stroke,  admitted to ED on 04/17/18 with dysphagia and right sided weakness s/p left MCA CVA, extubated on 04/19/18 and discharged on 04/23/18 to private residence    Patient Stated Goals   Patient reports she would like to get her right hand to move more and be able to use for daily tasks.    Currently in Pain?  Yes    Pain Score  3     Pain Location  Shoulder    Pain Orientation  Right    Pain Descriptors / Indicators  Aching    Pain Type  Acute pain    Pain Onset  More than a month ago    Pain Frequency  Intermittent    Multiple Pain Sites  No        No pain with right shoulder at rest but with movement.  Reports she has been working on raising the arm to reach to help wash her hair.  Was able to do it yesterday partially, some difficulty with reaching to head today.  Would like to work on shoulder range of motion today.     Contrast Contrast to RUE hand and wrist prior to exercises and ROMfor 11 mins, alternating warm and cold to decrease edema and prepare hand for therex.   Therapeutic Exercises: Following contrast, PROM to right hand for finger flexion, able to achieve full fist after prolonged stretching with a slight lag in index finger flexion.Active ROM of right hand for finger flexion and extension as well as gross  grasping patterns to pick up smaller objects..   Mat Exercises in supine after PROM of hand Joint mobs to right shoulder for scapular elevation, depression, upwards rotation followed by posterior and inferior joint mobs [facility] prior to ROM to shoulder.  Pain 8/10 at the shoulder prior to joint mobs, decreased to 4/10 after mobs.  Shoulder flexion to 90 degrees, ABD to 85 degrees passively and then attempts at active ROM. Assist from therapist to place and hold arm at 90 degrees of shoulder flex while working on protraction, triceps press and reaching towards opposite shoulder.    Response to tx:  Patient continues to demonstrate increased pain in right shoulder and has limited movement noted at the shoulder.  She is able to work towards more distal movement at the elbow and hand with assist for shoulder stabilization at 90  degrees of flexion.  Patient continues to demonstrate improvements with grasping patterns with right hand and being able to hold and use arm as a gross assist. Continue to work towards goals to increase safety and independence in daily tasks.                OT Education - 01/26/19 2024    Education Details  ROM, edema control, shoulder exercises    Person(s) Educated  Patient    Methods  Explanation;Demonstration    Comprehension  Verbalized understanding;Returned demonstration          OT Long Term Goals - 01/26/19 2027      OT LONG TERM GOAL #1   Title  Patient will complete home exercise program with modified independence.    Baseline  no current program    Time  6    Period  Weeks    Status  On-going      OT LONG TERM GOAL #2   Title  Patient will demonstrate understanding of Edema control measures for her right upper extremity and implement into daily routine.    Baseline  has not been educated on edema control    Time  3    Period  Weeks    Status  On-going      OT LONG TERM GOAL #3   Title  Patient will demonstrate improved grip by 5 # in right Hand to be able to hold items in hand with gross grasp.    Baseline  2# on right, unable to grasp items    Time  6    Period  Weeks    Status  On-going      OT LONG TERM GOAL #4   Title  Patient will increase range of motion of fingers in right hand to be able to grasp built-up utensils for self feeding with modified independence.    Baseline  unable to feed self or hold utensils with right hand    Time  6    Period  Months    Status  Achieved      OT LONG TERM GOAL #5   Title  Patient will improve right hand function to be able to grasp pen to sign name on important papers with fair legibility.    Baseline  unable to hold pen or sign name    Time  6    Period  Weeks    Status  On-going      OT LONG TERM GOAL #6   Title  Patient will hold a cup with modified independence.    Baseline  unable at  eval with  right hand    Time  6    Period  Weeks    Status  On-going      OT LONG TERM GOAL #7   Title  Patient will improve right hand function to manipulate buttons, snaps, zippers with modified independence.    Baseline  difficulty at eval and requires assist at times.    Time  6    Period  Weeks    Status  On-going            Plan - 01/26/19 2025    Clinical Impression Statement  Patient continues to demonstrate increased pain in right shoulder and has limited movement noted at the shoulder.  She is able to work towards more distal movement at the elbow and hand with assist for shoulder stabilization at 90 degrees of flexion.  Patient continues to demonstrate improvements with grasping patterns with right hand and being able to hold and use arm as a gross assist. Continue to work towards goals to increase safety and independence in daily tasks.    OT Occupational Profile and History  Detailed Assessment- Review of Records and additional review of physical, cognitive, psychosocial history related to current functional performance    Occupational performance deficits (Please refer to evaluation for details):  ADL's;IADL's;Work;Leisure    Body Structure / Function / Physical Skills  ADL;Coordination;GMC;UE functional use;Balance;Decreased knowledge of use of DME;Flexibility;IADL;Dexterity;FMC;Proprioception;Strength;Edema;ROM    Cognitive Skills  Memory    Psychosocial Skills  Environmental  Adaptations;Habits;Routines and Behaviors    Rehab Potential  Good    Clinical Decision Making  Limited treatment options, no task modification necessary    Comorbidities Affecting Occupational Performance:  May have comorbidities impacting occupational performance    Modification or Assistance to Complete Evaluation   No modification of tasks or assist necessary to complete eval    OT Frequency  2x / week    OT Duration  6 weeks    OT Treatment/Interventions  Self-care/ADL training;Cryotherapy;Therapeutic  exercise;DME and/or AE instruction;Functional Mobility Training;Cognitive remediation/compensation;Balance training;Electrical Stimulation;Neuromuscular education;Manual Therapy;Splinting;Moist Heat;Contrast Bath;Therapeutic activities;Patient/family education;Passive range of motion    Consulted and Agree with Plan of Care  Patient       Patient will benefit from skilled therapeutic intervention in order to improve the following deficits and impairments:   Body Structure / Function / Physical Skills: ADL, Coordination, GMC, UE functional use, Balance, Decreased knowledge of use of DME, Flexibility, IADL, Dexterity, FMC, Proprioception, Strength, Edema, ROM Cognitive Skills: Memory Psychosocial Skills: Environmental  Adaptations, Habits, Routines and Behaviors   Visit Diagnosis: Muscle weakness (generalized)  Hemiplegia and hemiparesis following cerebral infarction affecting right dominant side (HCC)  Other lack of coordination  Unsteadiness on feet    Problem List Patient Active Problem List   Diagnosis Date Noted  . Mixed hyperlipidemia 09/28/2018  . Middle cerebral artery embolism, left 04/18/2018  . History of stroke 04/17/2018  . IFG (impaired fasting glucose) 03/28/2018  . Essential hypertension, benign 01/15/2017  . Asthma 01/15/2017   [place] T Arne Cleveland, OTR/L, CLT  Adell Koval 01/27/2019, 12:51 PM  [place] [facility] MAIN [facility] SERVICES [address] [place], [place], [address] Phone: [phone]   Fax:  [phone]  Name: LONIA ROANE MRN: [id number] Date of Birth: 01-Jul-1955

## 2019-01-26 NOTE — Therapy (Signed)
[place] MAIN [facility] SERVICES [address] [place], [place], [address] Phone: [phone]   Fax:  [phone]  Physical Therapy Treatment  Patient Details  Name: Kristin Solis MRN: [id number] Date of Birth: 01/17/1956 Referring Provider (PT): [name]   Encounter Date: 01/26/2019  PT End of Session - 01/26/19 1050    Visit Number  12    Number of Visits  25    Date for PT Re-Evaluation  03/07/19    Authorization Type  eval 10/27    Authorization Time Period  approved 3 visist 11/4-11/13, 8 visits aproved ( 01/02/19-12/13)    Authorization - Visit Number  7    Authorization - Number of Visits  8    PT Start Time  1055    PT Stop Time  1133    PT Time Calculation (min)  38 min    Equipment Utilized During Treatment  Gait belt    Activity Tolerance  Patient tolerated treatment well;No increased pain;Patient limited by fatigue    Behavior During Therapy  [facility] for tasks assessed/performed       Past Medical History:  Diagnosis Date  . Asthma   . Hypertension   . Stroke [facility])     Past Surgical History:  Procedure Laterality Date  . ABDOMINAL HYSTERECTOMY  2005   total  . IR CT HEAD LTD  04/18/2018  . IR INTRAVSC STENT CERV CAROTID W/O EMB-PROT MOD SED INC ANGIO  04/18/2018  . IR PERCUTANEOUS ART THROMBECTOMY/INFUSION INTRACRANIAL INC DIAG ANGIO  04/18/2018  . OOPHORECTOMY    . RADIOLOGY WITH ANESTHESIA N/A 04/17/2018   Procedure: RADIOLOGY WITH ANESTHESIA;  Surgeon: Luanne Bras, MD;  Location: [place];  Service: Radiology;  Laterality: N/A;    There were no vitals filed for this visit.  Subjective Assessment - 01/26/19 1049    Subjective  Patient reported that she is doing well today, no falls or stumbles. Pt reported that she is doing her HEP 1-2x a day.    Patient is accompained by:  Family member    Pertinent History  Patient is a 63 y.o. female with history of stroke receiving care previously from [facility]. She was initially admitted to ED on 04/17/18 with dysphagia and right sided weakness s/p left MCA CVA, extubated on 04/19/18 and discharged on 04/23/18 to private residence. Was independent with ADLs/IADLs prior to admission. Inpatient PT reported decreased safety awareness and awareness of deficits, with deficits in the areas of balance, ambulation ability, transfers. Was able to ambulate 125 feet in hospital with min guard without AD, and independent with bed mobility.    Limitations  Lifting;Standing;Walking;Writing;House hold activities    How long can you sit comfortably?  As long as desired    How long can you stand comfortably?  5 minutes    How long can you walk comfortably?  Able to grocery shop, requires a seated rest break for a couple of minutes.    Patient Stated Goals  Wants to be able to use right arm and hand, and to feel more confortable with going shopping with her husband.    Currently in Pain?  No/denies    Pain Score  0-No pain    Pain Onset  Today       Ther-ex  Nu-step x 5 mins  Hip flexion marches with 3# ankle weights x 10 bilateral; Hip abduction with 3# x 10 bilateral; HS curls with 3# AW x 10  bilateral; Hip extension with 3# x 10 bilateral; Quantum leg press 100# x 20 x 3 sets  Squats x 20 with cues for correct posture Lunges to BOSU ball x 15 BLE Heel raises x 15 x 2 sets High marching x 20 BLE, 3 lbs Step ups to 6-inch stool form foam x 20   Patient needs occasional verbal cueing to improve posture and cueing to correctly perform exercises slowly, holding at end of range to increase motor firing of desired muscle to encourage fatigue.                         PT Education - 01/26/19 1049    Education Details  HEP    Person(s) Educated  Patient;Spouse    Methods  Explanation;Demonstration;Tactile cues;Verbal cues    Comprehension  Verbalized understanding;Returned demonstration;Verbal cues required;Tactile cues required;Need  further instruction       PT Short Term Goals - 01/18/19 1408      PT SHORT TERM GOAL #1   Title  Patient will be independent in initial home exercise program to improve strength/mobility for better functional independence with ADLs.    Baseline  Not made yet., 12/28/18: given a HEP but still needs cues for proper technique    Time  6    Period  Weeks    Status  Partially Met    Target Date  01/24/19      PT SHORT TERM GOAL #2   Title  Patient (> 26 years old) will complete five times sit to stand test in < 15 seconds indicating an increased LE strength and improved balance for decreased falls risk.    Baseline  12/13/18: 17.52 sec; 12/28/18: 13.53 sec without HHA    Time  6    Period  Weeks    Status  Achieved    Target Date  01/24/19        PT Long Term Goals - 01/18/19 1408      PT LONG TERM GOAL #1   Title  Patient will increase 10 meter walk test to >1.62ms as to improve gait speed for better community ambulation and to reduce fall risk.    Baseline  12/13/18: 0.64 m/s with rollator, 0.72 m/s without AD; 12/28/18: 0.84 m/s without AD 11/25: 1.0 m/s without AD    Time  12    Period  Weeks    Status  Achieved      PT LONG TERM GOAL #2   Title  Patient will increase six minute walk test distance to >1000 for progression to community ambulator and improve gait ability.    Baseline  12/13/18: 633ft with rollator; 12/28/18: deferred 11/25: 723 ft without an AD, 01/18/19 640    Time  12    Period  Weeks    Status  Partially Met    Target Date  03/07/19      PT LONG TERM GOAL #3   Title  Patient will be require no assist with ascend/descend 3 steps using Least restrictive assistive device without LOB to improve ability to get in/out of home.    Baseline  Dependent on rail assist to reach front door. 12/28/18: uses rail, one step at a time 11/25: able to ascend/descend step over step with BUE support, able to perform step to pattern with SUE support12/2/20=able to perform step  to pattern with SUE support12    Time  12    Period  Weeks  Status  Partially Met    Target Date  01/18/19      PT LONG TERM GOAL #4   Title  Patient will increase BLE gross strength to 4+/5 as to improve functional strength for independent gait, increased standing tolerance and increased ADL ability.    Baseline  12/13/18: RLE hip weakness; 12/28/18: RLE hip extension: 2+/5, hip abduction/adduction 3-/5, knee grossly 4-/5 11/25: RLE hip extension 3-/5 add/abd 3/5 knee 4/512/2/20=RLE hip extension 3-/5 add/abd 3/5 knee 4/5    Time  12    Period  Weeks    Status  Partially Met      PT LONG TERM GOAL #5   Title  Patient will increase Functional Gait Assessment score to >20/30 as to reduce fall risk and improve dynamic gait safety with community ambulation.    Baseline  12/28/18: 13/24, 01/18/19 13/30    Time  12    Period  Weeks    Status  New    Target Date  03/06/18            Plan - 01/26/19 1051    Clinical Impression Statement  Patient instructed in advanced LE strengthening and intermediate balance exercise. Patient required min VCS to improve weight shift and to increase move LLE with cues for coordination, for better stance control. Patient would benefit from additional skilled PT intervention to improve strength, balance and gait safety.   Personal Factors and Comorbidities  Age;Comorbidity 2;Fitness;Time since onset of injury/illness/exacerbation    Comorbidities  HTN, Hx of stroke    Examination-Activity Limitations  Bathing;Bed Mobility;Bend;Carry;Dressing;Lift;Locomotion Level;Reach Overhead;Squat;Stairs;Stand;Transfers    Examination-Participation Restrictions  Cleaning;Community Activity;Driving;Laundry;Shop;Yard Work    Rehab Potential  Good    PT Frequency  2x / week    PT Duration  12 weeks    PT Treatment/Interventions  ADLs/Self Care Home Management;Aquatic Therapy;Cryotherapy;Electrical Stimulation;Moist Heat;DME Instruction;Gait training;Stair  training;Functional mobility training;Therapeutic activities;Therapeutic exercise;Balance training;Neuromuscular re-education;Patient/family education;Orthotic Fit/Training;Manual techniques;Compression bandaging;Passive range of motion;Dry needling;Energy conservation;Taping    PT Next Visit Plan  review HEP, balance    PT Home Exercise Plan  No updates this date    Consulted and Agree with Plan of Care  Patient       Patient will benefit from skilled therapeutic intervention in order to improve the following deficits and impairments:  Abnormal gait, Decreased balance, Decreased endurance, Decreased mobility, Difficulty walking, Hypomobility, Decreased range of motion, Improper body mechanics, Decreased activity tolerance, Decreased strength, Impaired UE functional use, Postural dysfunction  Visit Diagnosis: Muscle weakness (generalized)  Hemiplegia and hemiparesis following cerebral infarction affecting right dominant side (HCC)  Other lack of coordination  Unsteadiness on feet  History of stroke  Localized edema  Other abnormalities of gait and mobility     Problem List Patient Active Problem List   Diagnosis Date Noted  . Mixed hyperlipidemia 09/28/2018  . Middle cerebral artery embolism, left 04/18/2018  . History of stroke 04/17/2018  . IFG (impaired fasting glucose) 03/28/2018  . Essential hypertension, benign 01/15/2017  . Asthma 01/15/2017    Alanson Puls, PT DPT 01/26/2019, 10:52 AM  [place] MAIN [facility] SERVICES [address] [place], [place], [address] Phone: [phone]   Fax:  [phone]  Name: Kristin Solis MRN: [id number] Date of Birth: 08-Mar-1955

## 2019-01-30 ENCOUNTER — Ambulatory Visit: Payer: Medicaid Other | Admitting: Occupational Therapy

## 2019-01-30 ENCOUNTER — Other Ambulatory Visit: Payer: Self-pay

## 2019-01-30 ENCOUNTER — Encounter: Payer: Self-pay | Admitting: Occupational Therapy

## 2019-01-30 ENCOUNTER — Encounter: Payer: Self-pay | Admitting: Physical Therapy

## 2019-01-30 ENCOUNTER — Ambulatory Visit: Payer: Medicaid Other | Admitting: Physical Therapy

## 2019-01-30 DIAGNOSIS — I69351 Hemiplegia and hemiparesis following cerebral infarction affecting right dominant side: Secondary | ICD-10-CM

## 2019-01-30 DIAGNOSIS — Z8673 Personal history of transient ischemic attack (TIA), and cerebral infarction without residual deficits: Secondary | ICD-10-CM

## 2019-01-30 DIAGNOSIS — R2689 Other abnormalities of gait and mobility: Secondary | ICD-10-CM

## 2019-01-30 DIAGNOSIS — R278 Other lack of coordination: Secondary | ICD-10-CM | POA: Diagnosis not present

## 2019-01-30 DIAGNOSIS — M6281 Muscle weakness (generalized): Secondary | ICD-10-CM | POA: Diagnosis not present

## 2019-01-30 DIAGNOSIS — R600 Localized edema: Secondary | ICD-10-CM

## 2019-01-30 DIAGNOSIS — R2681 Unsteadiness on feet: Secondary | ICD-10-CM

## 2019-01-30 NOTE — Therapy (Signed)
[place] MAIN [facility] SERVICES [address] [place], [place], [address] Phone: [phone]   Fax:  [phone]  Occupational Therapy Treatment  Patient Details  Name: LILYAN PRETE MRN: [id number] Date of Birth: Jul 30, 1955 Referring Provider (OT): Marnee Guarneri   Encounter Date: 01/30/2019  OT End of Session - 02/01/19 0941    Visit Number  11    Number of Visits  13    Date for OT Re-Evaluation  02/03/19    OT Start Time  1100    OT Stop Time  1144    OT Time Calculation (min)  44 min    Activity Tolerance  Patient tolerated treatment well    Behavior During Therapy  [facility] for tasks assessed/performed       Past Medical History:  Diagnosis Date  . Asthma   . Hypertension   . Stroke [facility])     Past Surgical History:  Procedure Laterality Date  . ABDOMINAL HYSTERECTOMY  2005   total  . IR CT HEAD LTD  04/18/2018  . IR INTRAVSC STENT CERV CAROTID W/O EMB-PROT MOD SED INC ANGIO  04/18/2018  . IR PERCUTANEOUS ART THROMBECTOMY/INFUSION INTRACRANIAL INC DIAG ANGIO  04/18/2018  . OOPHORECTOMY    . RADIOLOGY WITH ANESTHESIA N/A 04/17/2018   Procedure: RADIOLOGY WITH ANESTHESIA;  Surgeon: Luanne Bras, MD;  Location: [place];  Service: Radiology;  Laterality: N/A;    There were no vitals filed for this visit.  Subjective Assessment - 02/01/19 0940    Subjective   Patient reports she worked with her marbles and was able to hold 7 at a time in her right hand.  Has tried eating chips and small donuts in right hand with improved ROM and success.  Using red foam on utensil for self feeding during meal, Switches hand occasionally with hand fatigue.    Pertinent History  Patient is a 63 y.o. female with history of stroke,  admitted to ED on 04/17/18 with dysphagia and right sided weakness s/p left MCA CVA, extubated on 04/19/18 and discharged on 04/23/18 to private residence    Patient Stated Goals  Patient reports she would like to get her right hand  to move more and be able to use for daily tasks.    Currently in Pain?  No/denies    Pain Score  0-No pain        Patient reports she has no pain with the right shoulder when not moving but pain with movement.  No increased soreness in the shoulder after exercises last time.    Moist heat to right shoulder for 5 mins prior to ROM and while therapist performing PROM of right hand for digit flexion and extension.  Following hand ROM, patient seen on mat in supine for shoulder mobilizations posterior and inferior right followed by PROM with shoulder flexion to 90 degrees, ABD to 75 degrees, ER.  Patient requires assist to hold right arm in shoulder flexion to 90 degrees while performing elbow extension with facilitation at triceps.  Also performed elbow flexion and extension with assist to reach across to opposite shoulder.  Attempts at active shoulder ABD for multiple repetitions.    Neuromuscular:    Attempted grooved pegs without success, switched to ball pegs with reaching across to obtain pegs and placing into the board.  Difficulty with turning and manipulation of items, slow to complete task but very focused and able to perform with cues and occasional min assist.    Forwards  reach on table top with cloth under right arm, cues from therapist and occasional guiding with left UE.    Response to tx:  Patient continues to make good progress, she is demonstrating functional gains with gross grasp and release of items, self feeding with built up utensils and finger foods and attempting to hold things in her palm on the right side,using the hand for storage.  She continues to demonstrate pain in the right shoulder limiting motion and continues to demonstrate edema in right hand and wrist although improvements are noted.  Continue to work towards goals in plan of care to maximize safety and independence in daily tasks.                  OT Education - 02/01/19 0940    Education  Details  hand function right    Person(s) Educated  Patient    Methods  Explanation;Demonstration    Comprehension  Verbalized understanding;Returned demonstration          OT Long Term Goals - 01/26/19 2027      OT LONG TERM GOAL #1   Title  Patient will complete home exercise program with modified independence.    Baseline  no current program    Time  6    Period  Weeks    Status  On-going      OT LONG TERM GOAL #2   Title  Patient will demonstrate understanding of Edema control measures for her right upper extremity and implement into daily routine.    Baseline  has not been educated on edema control    Time  3    Period  Weeks    Status  On-going      OT LONG TERM GOAL #3   Title  Patient will demonstrate improved grip by 5 # in right Hand to be able to hold items in hand with gross grasp.    Baseline  2# on right, unable to grasp items    Time  6    Period  Weeks    Status  On-going      OT LONG TERM GOAL #4   Title  Patient will increase range of motion of fingers in right hand to be able to grasp built-up utensils for self feeding with modified independence.    Baseline  unable to feed self or hold utensils with right hand    Time  6    Period  Months    Status  Achieved      OT LONG TERM GOAL #5   Title  Patient will improve right hand function to be able to grasp pen to sign name on important papers with fair legibility.    Baseline  unable to hold pen or sign name    Time  6    Period  Weeks    Status  On-going      OT LONG TERM GOAL #6   Title  Patient will hold a cup with modified independence.    Baseline  unable at eval with right hand    Time  6    Period  Weeks    Status  On-going      OT LONG TERM GOAL #7   Title  Patient will improve right hand function to manipulate buttons, snaps, zippers with modified independence.    Baseline  difficulty at eval and requires assist at times.    Time  6    Period  Weeks  Status  On-going             Plan - 02/01/19 0941    Clinical Impression Statement  Patient continues to make good progress, she is demonstrating functional gains with gross grasp and release of items, self feeding with built up utensils and finger foods and attempting to hold things in her palm on the right side,using the hand for storage.  She continues to demonstrate pain in the right shoulder limiting motion and continues to demonstrate edema in right hand and wrist although improvements are noted.  Continue to work towards goals in plan of care to maximize safety and independence in daily tasks.    OT Occupational Profile and History  Detailed Assessment- Review of Records and additional review of physical, cognitive, psychosocial history related to current functional performance    Occupational performance deficits (Please refer to evaluation for details):  ADL's;IADL's;Work;Leisure    Body Structure / Function / Physical Skills  ADL;Coordination;GMC;UE functional use;Balance;Decreased knowledge of use of DME;Flexibility;IADL;Dexterity;FMC;Proprioception;Strength;Edema;ROM    Cognitive Skills  Memory    Psychosocial Skills  Environmental  Adaptations;Habits;Routines and Behaviors    Rehab Potential  Good    Clinical Decision Making  Limited treatment options, no task modification necessary    Comorbidities Affecting Occupational Performance:  May have comorbidities impacting occupational performance    Modification or Assistance to Complete Evaluation   No modification of tasks or assist necessary to complete eval    OT Frequency  2x / week    OT Duration  6 weeks    OT Treatment/Interventions  Self-care/ADL training;Cryotherapy;Therapeutic exercise;DME and/or AE instruction;Functional Mobility Training;Cognitive remediation/compensation;Balance training;Electrical Stimulation;Neuromuscular education;Manual Therapy;Splinting;Moist Heat;Contrast Bath;Therapeutic activities;Patient/family education;Passive range  of motion    Consulted and Agree with Plan of Care  Patient       Patient will benefit from skilled therapeutic intervention in order to improve the following deficits and impairments:   Body Structure / Function / Physical Skills: ADL, Coordination, GMC, UE functional use, Balance, Decreased knowledge of use of DME, Flexibility, IADL, Dexterity, FMC, Proprioception, Strength, Edema, ROM Cognitive Skills: Memory Psychosocial Skills: Environmental  Adaptations, Habits, Routines and Behaviors   Visit Diagnosis: Muscle weakness (generalized)  Hemiplegia and hemiparesis following cerebral infarction affecting right dominant side (HCC)  Other lack of coordination    Problem List Patient Active Problem List   Diagnosis Date Noted  . Mixed hyperlipidemia 09/28/2018  . Middle cerebral artery embolism, left 04/18/2018  . History of stroke 04/17/2018  . IFG (impaired fasting glucose) 03/28/2018  . Essential hypertension, benign 01/15/2017  . Asthma 01/15/2017    Jannessa Ogden 02/01/2019, 9:51 AM  [facility] [address] [place], [place], [id number] Phone: [phone]   Fax:  [phone]  Name: Dionicia AblerBeverly J Latterell MRN: [id number] Date of Birth: Jun 27, 1955

## 2019-01-30 NOTE — Therapy (Signed)
[place] MAIN [facility] SERVICES [address] [place], [place], [address] Phone: [phone]   Fax:  [phone]  Physical Therapy Treatment  Patient Details  Name: Kristin Solis MRN: [id number] Date of Birth: Dec 01, 1955 Referring Provider (PT): [name]   Encounter Date: 01/30/2019  PT End of Session - 01/30/19 1151    Visit Number  13    Number of Visits  25    Date for PT Re-Evaluation  03/07/19    Authorization Type  eval 10/27    Authorization Time Period  approved 3 visist 11/4-11/13, 8 visits aproved ( 01/02/19-12/13)    Authorization - Visit Number  7    Authorization - Number of Visits  8    PT Start Time  2505    PT Stop Time  1225    PT Time Calculation (min)  40 min    Equipment Utilized During Treatment  Gait belt    Activity Tolerance  Patient tolerated treatment well;No increased pain;Patient limited by fatigue    Behavior During Therapy  [facility] for tasks assessed/performed       Past Medical History:  Diagnosis Date  . Asthma   . Hypertension   . Stroke [facility])     Past Surgical History:  Procedure Laterality Date  . ABDOMINAL HYSTERECTOMY  2005   total  . IR CT HEAD LTD  04/18/2018  . IR INTRAVSC STENT CERV CAROTID W/O EMB-PROT MOD SED INC ANGIO  04/18/2018  . IR PERCUTANEOUS ART THROMBECTOMY/INFUSION INTRACRANIAL INC DIAG ANGIO  04/18/2018  . OOPHORECTOMY    . RADIOLOGY WITH ANESTHESIA N/A 04/17/2018   Procedure: RADIOLOGY WITH ANESTHESIA;  Surgeon: Luanne Bras, MD;  Location: [place];  Service: Radiology;  Laterality: N/A;    There were no vitals filed for this visit.  Subjective Assessment - 01/30/19 1150    Subjective  Patient reported that she is doing well today, no falls or stumbles. Pt reported that she is doing her HEP 1-2x a day.    Patient is accompained by:  Family member    Pertinent History  Patient is a 63 y.o. female with history of stroke receiving care previously from [facility]. She was initially admitted to ED on 04/17/18 with dysphagia and right sided weakness s/p left MCA CVA, extubated on 04/19/18 and discharged on 04/23/18 to private residence. Was independent with ADLs/IADLs prior to admission. Inpatient PT reported decreased safety awareness and awareness of deficits, with deficits in the areas of balance, ambulation ability, transfers. Was able to ambulate 125 feet in hospital with min guard without AD, and independent with bed mobility.    Limitations  Lifting;Standing;Walking;Writing;House hold activities    How long can you sit comfortably?  As long as desired    How long can you stand comfortably?  5 minutes    How long can you walk comfortably?  Able to grocery shop, requires a seated rest break for a couple of minutes.    Patient Stated Goals  Wants to be able to use right arm and hand, and to feel more confortable with going shopping with her husband.    Currently in Pain?  No/denies    Pain Score  0-No pain    Pain Onset  More than a month ago       Ther-ex Octane fitness x 5 mins  Lunge to bosu ball x 10 BLE  Lunge to bosu ball from foam  x 10 BLE  Step  ups to 6 inch stool x 15 BLE Tapping to 6 inch stool x 15 BLE Rocking on 1/2 foam fwd/bwd x 15 Tandem stand on 1/2 foam x 2 mins, trying not to hold on with BUE Hip flexion marches with 3# ankle weights x 10 bilateral; x 2 sets Hip abduction with 3# x 10 bilateral; HS curls with 3# AW x 10 bilateral; Hip extension with 3# x 10 bilateral; Quantum leg press 100# x20 x 3 sets  Quantum leg press  45 # x20 x 3 sets , heel raises      Patient needs occasional verbal cueing to improve posture and cueing to correctly perform exercises slowly, holding at end of range to increase motor firing of desired muscle to encourage fatigue.                         PT Education - 01/30/19 1150    Education Details  HEP    Person(s) Educated  Patient    Methods   Explanation;Demonstration;Tactile cues;Verbal cues    Comprehension  Verbalized understanding;Returned demonstration;Verbal cues required;Tactile cues required       PT Short Term Goals - 01/18/19 1408      PT SHORT TERM GOAL #1   Title  Patient will be independent in initial home exercise program to improve strength/mobility for better functional independence with ADLs.    Baseline  Not made yet., 12/28/18: given a HEP but still needs cues for proper technique    Time  6    Period  Weeks    Status  Partially Met    Target Date  01/24/19      PT SHORT TERM GOAL #2   Title  Patient (> 73 years old) will complete five times sit to stand test in < 15 seconds indicating an increased LE strength and improved balance for decreased falls risk.    Baseline  12/13/18: 17.52 sec; 12/28/18: 13.53 sec without HHA    Time  6    Period  Weeks    Status  Achieved    Target Date  01/24/19        PT Long Term Goals - 01/18/19 1408      PT LONG TERM GOAL #1   Title  Patient will increase 10 meter walk test to >1.66ms as to improve gait speed for better community ambulation and to reduce fall risk.    Baseline  12/13/18: 0.64 m/s with rollator, 0.72 m/s without AD; 12/28/18: 0.84 m/s without AD 11/25: 1.0 m/s without AD    Time  12    Period  Weeks    Status  Achieved      PT LONG TERM GOAL #2   Title  Patient will increase six minute walk test distance to >1000 for progression to community ambulator and improve gait ability.    Baseline  12/13/18: 659ft with rollator; 12/28/18: deferred 11/25: 723 ft without an AD, 01/18/19 640    Time  12    Period  Weeks    Status  Partially Met    Target Date  03/07/19      PT LONG TERM GOAL #3   Title  Patient will be require no assist with ascend/descend 3 steps using Least restrictive assistive device without LOB to improve ability to get in/out of home.    Baseline  Dependent on rail assist to reach front door. 12/28/18: uses rail, one step at a  time 11/25: able  to ascend/descend step over step with BUE support, able to perform step to pattern with SUE support12/2/20=able to perform step to pattern with SUE support12    Time  12    Period  Weeks    Status  Partially Met    Target Date  01/18/19      PT LONG TERM GOAL #4   Title  Patient will increase BLE gross strength to 4+/5 as to improve functional strength for independent gait, increased standing tolerance and increased ADL ability.    Baseline  12/13/18: RLE hip weakness; 12/28/18: RLE hip extension: 2+/5, hip abduction/adduction 3-/5, knee grossly 4-/5 11/25: RLE hip extension 3-/5 add/abd 3/5 knee 4/512/2/20=RLE hip extension 3-/5 add/abd 3/5 knee 4/5    Time  12    Period  Weeks    Status  Partially Met      PT LONG TERM GOAL #5   Title  Patient will increase Functional Gait Assessment score to >20/30 as to reduce fall risk and improve dynamic gait safety with community ambulation.    Baseline  12/28/18: 13/24, 01/18/19 13/30    Time  12    Period  Weeks    Status  New    Target Date  03/06/18            Plan - 01/30/19 1151    Clinical Impression Statement   Pt was able to perform all exercises today with CGA.[name] Pt was able to perform all balance and strength exercises, demonstrating improvements in LE strength and stability.  Pt was able to complete dynamic balance exercises, showing ability to stand on even surfaces with min assist and improve postural reactions to correct self during activities.  Pt requires verbal, visual and tactile cues during exercise in order to complete tasks with proper form and technique, as well as to stay on task.  Pt would continue to benefit from skilled PT services in order to further strengthen LE's, improve static and dynamic balance, and improve coordination in order to increase functional mobility and decrease risk of falls   Personal Factors and Comorbidities  Age;Comorbidity 2;Fitness;Time since onset of injury/illness/exacerbation     Comorbidities  HTN, Hx of stroke    Examination-Activity Limitations  Bathing;Bed Mobility;Bend;Carry;Dressing;Lift;Locomotion Level;Reach Overhead;Squat;Stairs;Stand;Transfers    Examination-Participation Restrictions  Cleaning;Community Activity;Driving;Laundry;Shop;Yard Work    Rehab Potential  Good    PT Frequency  2x / week    PT Duration  12 weeks    PT Treatment/Interventions  ADLs/Self Care Home Management;Aquatic Therapy;Cryotherapy;Electrical Stimulation;Moist Heat;DME Instruction;Gait training;Stair training;Functional mobility training;Therapeutic activities;Therapeutic exercise;Balance training;Neuromuscular re-education;Patient/family education;Orthotic Fit/Training;Manual techniques;Compression bandaging;Passive range of motion;Dry needling;Energy conservation;Taping    PT Next Visit Plan  review HEP, balance    PT Home Exercise Plan  No updates this date    Consulted and Agree with Plan of Care  Patient       Patient will benefit from skilled therapeutic intervention in order to improve the following deficits and impairments:  Abnormal gait, Decreased balance, Decreased endurance, Decreased mobility, Difficulty walking, Hypomobility, Decreased range of motion, Improper body mechanics, Decreased activity tolerance, Decreased strength, Impaired UE functional use, Postural dysfunction  Visit Diagnosis: Muscle weakness (generalized)  Hemiplegia and hemiparesis following cerebral infarction affecting right dominant side (HCC)  Other lack of coordination  Unsteadiness on feet  History of stroke  Localized edema  Other abnormalities of gait and mobility     Problem List Patient Active Problem List   Diagnosis Date Noted  . Mixed hyperlipidemia 09/28/2018  .  Middle cerebral artery embolism, left 04/18/2018  . History of stroke 04/17/2018  . IFG (impaired fasting glucose) 03/28/2018  . Essential hypertension, benign 01/15/2017  . Asthma 01/15/2017    Alanson Puls, PT DPT 01/30/2019, 11:52 AM  [place] MAIN [facility] SERVICES [address] [place], [place], [address] Phone: [phone]   Fax:  [phone]  Name: MURPHY BUNDICK MRN: [id number] Date of Birth: 1955/05/29

## 2019-02-02 ENCOUNTER — Ambulatory Visit: Payer: Medicaid Other | Admitting: Occupational Therapy

## 2019-02-02 ENCOUNTER — Encounter: Payer: Self-pay | Admitting: Physical Therapy

## 2019-02-02 ENCOUNTER — Other Ambulatory Visit: Payer: Self-pay

## 2019-02-02 ENCOUNTER — Ambulatory Visit: Payer: Medicaid Other | Admitting: Physical Therapy

## 2019-02-02 DIAGNOSIS — R278 Other lack of coordination: Secondary | ICD-10-CM

## 2019-02-02 DIAGNOSIS — M6281 Muscle weakness (generalized): Secondary | ICD-10-CM

## 2019-02-02 DIAGNOSIS — R600 Localized edema: Secondary | ICD-10-CM | POA: Diagnosis not present

## 2019-02-02 DIAGNOSIS — Z8673 Personal history of transient ischemic attack (TIA), and cerebral infarction without residual deficits: Secondary | ICD-10-CM

## 2019-02-02 DIAGNOSIS — R2689 Other abnormalities of gait and mobility: Secondary | ICD-10-CM | POA: Diagnosis not present

## 2019-02-02 DIAGNOSIS — I69351 Hemiplegia and hemiparesis following cerebral infarction affecting right dominant side: Secondary | ICD-10-CM

## 2019-02-02 DIAGNOSIS — R2681 Unsteadiness on feet: Secondary | ICD-10-CM

## 2019-02-02 NOTE — Therapy (Signed)
[place] MAIN [facility] SERVICES [address] [place], [place], [address] Phone: [phone]   Fax:  [phone]  Physical Therapy Treatment  Patient Details  Name: Kristin Solis MRN: [id number] Date of Birth: [birth year] Referring Provider (PT): [name]   Encounter Date: 02/02/2019  PT End of Session - 02/02/19 1106    Visit Number  14    Number of Visits  25    Date for PT Re-Evaluation  03/07/19    Authorization Type  eval 10/27    Authorization Time Period  approved 3 visist 11/4-11/13, 8 visits aproved ( 01/02/19-12/13)    Authorization - Visit Number  7    Authorization - Number of Visits  8    PT Start Time  1194    PT Stop Time  1145    PT Time Calculation (min)  42 min    Equipment Utilized During Treatment  Gait belt    Activity Tolerance  Patient tolerated treatment well;No increased pain;Patient limited by fatigue    Behavior During Therapy  [facility] for tasks assessed/performed       Past Medical History:  Diagnosis Date  . Asthma   . Hypertension   . Stroke [facility])     Past Surgical History:  Procedure Laterality Date  . ABDOMINAL HYSTERECTOMY  2005   total  . IR CT HEAD LTD  04/18/2018  . IR INTRAVSC STENT CERV CAROTID W/O EMB-PROT MOD SED INC ANGIO  04/18/2018  . IR PERCUTANEOUS ART THROMBECTOMY/INFUSION INTRACRANIAL INC DIAG ANGIO  04/18/2018  . OOPHORECTOMY    . RADIOLOGY WITH ANESTHESIA N/A 04/17/2018   Procedure: RADIOLOGY WITH ANESTHESIA;  Surgeon: Luanne Bras, MD;  Location: [place];  Service: Radiology;  Laterality: N/A;    There were no vitals filed for this visit.  Subjective Assessment - 02/02/19 1105    Subjective  Patient reported that she is doing well today, no falls or stumbles. Pt reported that she is doing her HEP 1-2x a day.    Patient is accompained by:  Family member    Pertinent History  Patient is a 63 y.o. female with history of stroke receiving care previously from [facility]. She was initially admitted to ED on 04/17/18 with dysphagia and right sided weakness s/p left MCA CVA, extubated on 04/19/18 and discharged on 04/23/18 to private residence. Was independent with ADLs/IADLs prior to admission. Inpatient PT reported decreased safety awareness and awareness of deficits, with deficits in the areas of balance, ambulation ability, transfers. Was able to ambulate 125 feet in hospital with min guard without AD, and independent with bed mobility.    Limitations  Lifting;Standing;Walking;Writing;House hold activities    How long can you sit comfortably?  As long as desired    How long can you stand comfortably?  5 minutes    How long can you walk comfortably?  Able to grocery shop, requires a seated rest break for a couple of minutes.    Patient Stated Goals  Wants to be able to use right arm and hand, and to feel more confortable with going shopping with her husband.    Pain Onset  More than a month ago       Therapeutic exercise: Octane fitness x 5 mins L 4  Supine: SLR x 15 BLE, 3 lbs Hookling marching x 15 , 3 lbs Hooklying abd/ER x 15 , RTB Bridging x 15 SAQ x 15 BLE, 3 lbs Sidelying: Hip abd x  15 , BLE, 3 lbs  Pt educated throughout session about proper posture and technique with exercises. Improved exercise technique, movement at target joints, use of target muscles after min to mod verbal, visual, tactile cues.                            PT Education - 02/02/19 1105    Education Details  HEP    Person(s) Educated  Patient    Methods  Explanation;Demonstration;Tactile cues;Verbal cues    Comprehension  Verbalized understanding;Returned demonstration;Verbal cues required;Tactile cues required;Need further instruction       PT Short Term Goals - 01/18/19 1408      PT SHORT TERM GOAL #1   Title  Patient will be independent in initial home exercise program to improve strength/mobility for better functional independence with  ADLs.    Baseline  Not made yet., 12/28/18: given a HEP but still needs cues for proper technique    Time  6    Period  Weeks    Status  Partially Met    Target Date  01/24/19      PT SHORT TERM GOAL #2   Title  Patient (> 22 years old) will complete five times sit to stand test in < 15 seconds indicating an increased LE strength and improved balance for decreased falls risk.    Baseline  12/13/18: 17.52 sec; 12/28/18: 13.53 sec without HHA    Time  6    Period  Weeks    Status  Achieved    Target Date  01/24/19        PT Long Term Goals - 01/18/19 1408      PT LONG TERM GOAL #1   Title  Patient will increase 10 meter walk test to >1.39ms as to improve gait speed for better community ambulation and to reduce fall risk.    Baseline  12/13/18: 0.64 m/s with rollator, 0.72 m/s without AD; 12/28/18: 0.84 m/s without AD 11/25: 1.0 m/s without AD    Time  12    Period  Weeks    Status  Achieved      PT LONG TERM GOAL #2   Title  Patient will increase six minute walk test distance to >1000 for progression to community ambulator and improve gait ability.    Baseline  12/13/18: 668ft with rollator; 12/28/18: deferred 11/25: 723 ft without an AD, 01/18/19 640    Time  12    Period  Weeks    Status  Partially Met    Target Date  03/07/19      PT LONG TERM GOAL #3   Title  Patient will be require no assist with ascend/descend 3 steps using Least restrictive assistive device without LOB to improve ability to get in/out of home.    Baseline  Dependent on rail assist to reach front door. 12/28/18: uses rail, one step at a time 11/25: able to ascend/descend step over step with BUE support, able to perform step to pattern with SUE support12/2/20=able to perform step to pattern with SUE support12    Time  12    Period  Weeks    Status  Partially Met    Target Date  01/18/19      PT LONG TERM GOAL #4   Title  Patient will increase BLE gross strength to 4+/5 as to improve functional strength  for independent gait, increased standing tolerance and increased ADL ability.  Baseline  12/13/18: RLE hip weakness; 12/28/18: RLE hip extension: 2+/5, hip abduction/adduction 3-/5, knee grossly 4-/5 11/25: RLE hip extension 3-/5 add/abd 3/5 knee 4/512/2/20=RLE hip extension 3-/5 add/abd 3/5 knee 4/5    Time  12    Period  Weeks    Status  Partially Met      PT LONG TERM GOAL #5   Title  Patient will increase Functional Gait Assessment score to >20/30 as to reduce fall risk and improve dynamic gait safety with community ambulation.    Baseline  12/28/18: 13/24, 01/18/19 13/30    Time  12    Period  Weeks    Status  New    Target Date  03/06/18            Plan - 02/02/19 1106    Clinical Impression Statement  Instructed patient in advanced LE strengthening; patient requires min VCs for correct exercise technique to improve strengthening; Patient reports no increase in pain but does report slight fatigue in BLE quad muscles with advanced exercise. Patient would benefit from additional skilled PT intervention to improve strength, balance/gait safety.   Personal Factors and Comorbidities  Age;Comorbidity 2;Fitness;Time since onset of injury/illness/exacerbation    Comorbidities  HTN, Hx of stroke    Examination-Activity Limitations  Bathing;Bed Mobility;Bend;Carry;Dressing;Lift;Locomotion Level;Reach Overhead;Squat;Stairs;Stand;Transfers    Examination-Participation Restrictions  Cleaning;Community Activity;Driving;Laundry;Shop;Yard Work    Rehab Potential  Good    PT Frequency  2x / week    PT Duration  12 weeks    PT Treatment/Interventions  ADLs/Self Care Home Management;Aquatic Therapy;Cryotherapy;Electrical Stimulation;Moist Heat;DME Instruction;Gait training;Stair training;Functional mobility training;Therapeutic activities;Therapeutic exercise;Balance training;Neuromuscular re-education;Patient/family education;Orthotic Fit/Training;Manual techniques;Compression bandaging;Passive  range of motion;Dry needling;Energy conservation;Taping    PT Next Visit Plan  review HEP, balance    PT Home Exercise Plan  No updates this date    Consulted and Agree with Plan of Care  Patient       Patient will benefit from skilled therapeutic intervention in order to improve the following deficits and impairments:  Abnormal gait, Decreased balance, Decreased endurance, Decreased mobility, Difficulty walking, Hypomobility, Decreased range of motion, Improper body mechanics, Decreased activity tolerance, Decreased strength, Impaired UE functional use, Postural dysfunction  Visit Diagnosis: Hemiplegia and hemiparesis following cerebral infarction affecting right dominant side (HCC)  Muscle weakness (generalized)  Other lack of coordination  Unsteadiness on feet  History of stroke  Localized edema  Other abnormalities of gait and mobility     Problem List Patient Active Problem List   Diagnosis Date Noted  . Mixed hyperlipidemia 09/28/2018  . Middle cerebral artery embolism, left 04/18/2018  . History of stroke 04/17/2018  . IFG (impaired fasting glucose) 03/28/2018  . Essential hypertension, benign 01/15/2017  . Asthma 01/15/2017    Alanson Puls, PT DPT 02/02/2019, 11:07 AM  [place] MAIN [facility] SERVICES [address] [place], [place], [address] Phone: [phone]   Fax:  [phone]  Name: Kristin Solis MRN: [id number] Date of Birth: Jan 19, 1956

## 2019-02-03 ENCOUNTER — Encounter: Payer: Self-pay | Admitting: Occupational Therapy

## 2019-02-03 NOTE — Therapy (Signed)
[facility] MAIN [facility] SERVICES [address] [place], [place], [address] Phone: [phone]   Fax:  [phone]  Occupational Therapy Treatment/Recertification  Patient Details  Name: Kristin Solis MRN: [id number] Date of Birth: 01/20/1956 Referring Provider (OT): Aura Dials   Encounter Date: 02/02/2019  OT End of Session - 02/03/19 0815    Visit Number  12    Number of Visits  37    Date for OT Re-Evaluation  04/28/18    OT Start Time  1100    OT Stop Time  1145    OT Time Calculation (min)  45 min    Activity Tolerance  Patient tolerated treatment well    Behavior During Therapy  [facility] for tasks assessed/performed       Past Medical History:  Diagnosis Date  . Asthma   . Hypertension   . Stroke [facility])     Past Surgical History:  Procedure Laterality Date  . ABDOMINAL HYSTERECTOMY  2005   total  . IR CT HEAD LTD  04/18/2018  . IR INTRAVSC STENT CERV CAROTID W/O EMB-PROT MOD SED INC ANGIO  04/18/2018  . IR PERCUTANEOUS ART THROMBECTOMY/INFUSION INTRACRANIAL INC DIAG ANGIO  04/18/2018  . OOPHORECTOMY    . RADIOLOGY WITH ANESTHESIA N/A 04/17/2018   Procedure: RADIOLOGY WITH ANESTHESIA;  Surgeon: Julieanne Cotton, MD;  Location: [facility] OR;  Service: Radiology;  Laterality: N/A;    There were no vitals filed for this visit.  Subjective Assessment - 02/03/19 0814    Pertinent History  Patient is a 63 y.o. female with history of stroke,  admitted to ED on 04/17/18 with dysphagia and right sided weakness s/p left MCA CVA, extubated on 04/19/18 and discharged on 04/23/18 to private residence    Patient Stated Goals  Patient reports she would like to get her right hand to move more and be able to use for daily tasks.    Currently in Pain?  Yes    Pain Score  5     Pain Location  Shoulder    Pain Orientation  Right    Pain Descriptors / Indicators  Aching    Pain Type  Chronic pain    Pain Onset  More than a month ago    Pain Frequency   Intermittent       ADL reassessment:  Please refer to assessment section of this note for detailed information on status of ADL and IADL tasks.   Patient seen for use of [place] this date during treatment session to manage edema of right hand, applied by therapist in circular wrap method to individual digits of the right hand, the dorsum of the hand and wrist to the forearm.  PROM of right digits followed by active ROM for fisting and extension of fingers.  Prolonged stretching to index finger in flexion.  Patient seen for forward reaching tasks on tabletop with support of right arm, multiple trials and repetitions completed.  Reassessment of right grip strength with dynamometer:  6# grip right (improved from 2 pounds at evaluation)   Right shoulder flexion 45 degrees actively  Goals updated to reflect progress.  Circumferential Measurements of right hand:  Index 7.0 cm Thumb 7.1 MCP 18.4 Dorsum of hand 19.7 Wrist 18.4 10cm from ulnar styloid 24.5 15 cm from ulnar styloid 28.2   Response to tx:  Patient has begun to work on being able to use right hand to zip select zippers on clothing.  She is still unable to  complete any sized buttons. She has been attempting to grasp water bottle with right hand  but still requires the use of her left hand to help guide the water bottle up to her mouth and to ensure she doesn't drop it.  She is now able to consistently use the built up foam handle to feed herself and has been able to feed herself the whole meal this week versus just portions of the meal.  She is now starting to work towards select finger foods for self feeding with right hand.  She would like to be able to help and perform cooking tasks.  She would like to be able to manage to stir with a large spoon and use a hand mixer for baking tasks.  She is unable to lift pot or pans and cannot pour a drink.  She is performing her bathing with modified independence and needs help from husband to dry off  her back.  She is unable to tie shoes.  She has not yet returned to driving and relies on her husband for all transportation needs.  Patient continues to demonstrate moderate edema in her right hand which limits her functional use of the hand however, she has been able to demonstrate understanding and implement strategies to help reduce and manage the edema in the hand.  With decreased edema, she has been able to show progress with her active and passive range of motion of the right hand.  Passively she has full flexion and extension. Actively she is able to achieve all fingers to palm with effort except her index finger which still lacks 3-4 cm at times from palm.  She has improved her grip strength from 2# to 6# on the right.  Patient's right shoulder remains painful with movement and limited with active and passive motion.  She responds well to manual techniques and facilitatory techniques and has shown decreased pain and increased active movement with forward flexion and ABD to be able to wash under her right arm.  She would benefit from additional OT to maximize her safety and independence in daily tasks.  Areas of focus include: edema control, pain reduction, improved ROM for reaching, grasping and moving towards functional use of the right hand as well as improved participation in IADL tasks now that patient has improved her basic ADL function.                     OT Education - 02/03/19 0815    Education Details  progress, goals, HEP    Person(s) Educated  Patient    Methods  Explanation;Demonstration    Comprehension  Verbalized understanding;Returned demonstration          OT Long Term Goals - 02/03/19 1610      OT LONG TERM GOAL #1   Title  Patient will complete home exercise program with modified independence.    Baseline  addition of new exercises ongoing as patient progresses with RUE.    Time  12    Period  Weeks    Status  On-going    Target Date  04/28/19       OT LONG TERM GOAL #2   Title  Patient will demonstrate understanding of Edema control measures for her right upper extremity and implement into daily routine.    Baseline  able to demo understanding and methods of edema control    Time  3    Period  Weeks    Status  Achieved  OT LONG TERM GOAL #3   Title  Patient will demonstrate improved grip by 5 # in right Hand to be able to hold items in hand with gross grasp.    Baseline  2# on right, unable to grasp items, UPdate on 02/02/19 patient has 6# and able to grasp select items depending on size and weight of item    Time  6    Period  Weeks    Status  On-going    Target Date  03/03/19      OT LONG TERM GOAL #4   Title  Patient will increase range of motion of fingers in right hand to be able to grasp built-up utensils for self feeding with modified independence.    Baseline  unable to feed self or hold utensils with right hand    Time  6    Period  Months    Status  Achieved      OT LONG TERM GOAL #5   Title  Patient will improve right hand function to be able to grasp pen to sign name on important papers with fair legibility.    Baseline  unable to hold pen or sign name, Update:  able to grasp large built up pen and starting to form strokes for handwriting    Time  12    Period  Weeks    Status  On-going    Target Date  04/28/19      Long Term Additional Goals   Additional Long Term Goals  Yes      OT LONG TERM GOAL #6   Title  Patient will hold a cup with modified independence.    Baseline  unable at eval with right hand, update on 02/02/19:  patient able to gross hold cup but has difficulty with maintaining grasp and depending on the weight and contents.    Time  12    Period  Weeks    Status  On-going    Target Date  04/28/19      OT LONG TERM GOAL #7   Title  Patient will improve right hand function to manipulate buttons, snaps, zippers with modified independence.    Baseline  difficulty at eval and requires assist  at times.  Update:  can do some zippers but no buttons to date    Time  12    Period  Weeks    Status  On-going    Target Date  04/28/19      OT LONG TERM GOAL #8   Title  Patient will demonstrate the ability to stir items in a bowl of varying consistencies with modified independence.    Baseline  unable 02/02/2019    Time  12    Period  Weeks    Status  New    Target Date  04/28/19      OT LONG TERM GOAL  #9   TITLE  Patient will demonstrate the ability to move pots and pans from one location to another in the kitchen with modified independence.    Baseline  02/02/19 unable    Time  12    Period  Weeks    Status  New    Target Date  04/28/19      OT LONG TERM GOAL  #10   TITLE  Patient will improve right shoulder flexion by 10 degrees to assist with reaching and placing objects on table.    Baseline  02/02/2019:  right shoulder flexion 45 degrees  Time  12    Period  Weeks    Status  New    Target Date  04/28/19            Plan - 02/03/19 0816    Clinical Impression Statement  Patient has begun to work on being able to use right hand to zip select zippers on clothing.  She is still unable to complete any sized buttons. She has been attempting to grasp water bottle with right hand  but still requires the use of her left hand to help guide the water bottle up to her mouth and to ensure she doesn't drop it.  She is now able to consistently use the built up foam handle to feed herself and has been able to feed herself the whole meal this week versus just portions of the meal.  She is now starting to work towards select finger foods for self feeding with right hand.  She would like to be able to help and perform cooking tasks.  She would like to be able to manage to stir with a large spoon and use a hand mixer for baking tasks.  She is unable to lift pot or pans and cannot pour a drink.  She is performing her bathing with modified independence and needs help from husband to dry  off her back.  She is unable to tie shoes.  She has not yet returned to driving and relies on her husband for all transportation needs.  Patient continues to demonstrate moderate edema in her right hand which limits her functional use of the hand however, she has been able to demonstrate understanding and implement strategies to help reduce and manage the edema in the hand.  With decreased edema, she has been able to show progress with her active and passive range of motion of the right hand.  Passively she has full flexion and extension. Actively she is able to achieve all fingers to palm with effort except her index finger which still lacks 3-4 cm at times from palm.  She has improved her grip strength from 2# to 6# on the right.  Patient's right shoulder remains painful with movement and limited with active and passive motion.  She responds well to manual techniques and facilitatory techniques and has shown decreased pain and increased active movement with forward flexion and ABD to be able to wash under her right arm.  She would benefit from additional OT to maximize her safety and independence in daily tasks.  Areas of focus include: edema control, pain reduction, improved ROM for reaching, grasping and moving towards functional use of the right hand as well as improved participation in IADL tasks now that patient has improved her basic ADL function.    Occupational performance deficits (Please refer to evaluation for details):  ADL's;IADL's;Work;Leisure    Body Structure / Function / Physical Skills  ADL;Coordination;GMC;UE functional use;Balance;Decreased knowledge of use of DME;Flexibility;IADL;Dexterity;FMC;Proprioception;Strength;Edema;ROM    Cognitive Skills  Memory    Psychosocial Skills  Environmental  Adaptations;Habits;Routines and Behaviors    Rehab Potential  Good    Clinical Decision Making  Limited treatment options, no task modification necessary    Comorbidities Affecting Occupational  Performance:  May have comorbidities impacting occupational performance    Modification or Assistance to Complete Evaluation   No modification of tasks or assist necessary to complete eval    OT Frequency  2x / week    OT Duration  12 weeks    OT Treatment/Interventions  Self-care/ADL training;Cryotherapy;Therapeutic exercise;DME and/or AE instruction;Functional Mobility Training;Cognitive remediation/compensation;Balance training;Electrical Stimulation;Neuromuscular education;Manual Therapy;Splinting;Moist Heat;Contrast Bath;Therapeutic activities;Patient/family education;Passive range of motion    Consulted and Agree with Plan of Care  Patient       Patient will benefit from skilled therapeutic intervention in order to improve the following deficits and impairments:   Body Structure / Function / Physical Skills: ADL, Coordination, GMC, UE functional use, Balance, Decreased knowledge of use of DME, Flexibility, IADL, Dexterity, FMC, Proprioception, Strength, Edema, ROM Cognitive Skills: Memory Psychosocial Skills: Environmental  Adaptations, Habits, Routines and Behaviors   Visit Diagnosis: Muscle weakness (generalized)  Hemiplegia and hemiparesis following cerebral infarction affecting right dominant side (HCC)  Other lack of coordination    Problem List Patient Active Problem List   Diagnosis Date Noted  . Mixed hyperlipidemia 09/28/2018  . Middle cerebral artery embolism, left 04/18/2018  . History of stroke 04/17/2018  . IFG (impaired fasting glucose) 03/28/2018  . Essential hypertension, benign 01/15/2017  . Asthma 01/15/2017    Goble Fudala 02/03/2019, 9:02 AM  [place] [facility] MAIN [facility] SERVICES [address] [place], [place], [address] Phone: [phone]   Fax:  [phone]  Name: KAYLENN CIVIL MRN: [id number] Date of Birth: 03-May-1955

## 2019-02-06 ENCOUNTER — Ambulatory Visit: Payer: Medicaid Other | Admitting: Occupational Therapy

## 2019-02-06 ENCOUNTER — Encounter: Payer: Self-pay | Admitting: Occupational Therapy

## 2019-02-06 ENCOUNTER — Ambulatory Visit: Payer: Medicaid Other | Admitting: Physical Therapy

## 2019-02-06 ENCOUNTER — Other Ambulatory Visit: Payer: Self-pay

## 2019-02-06 DIAGNOSIS — M6281 Muscle weakness (generalized): Secondary | ICD-10-CM

## 2019-02-06 DIAGNOSIS — R600 Localized edema: Secondary | ICD-10-CM | POA: Diagnosis not present

## 2019-02-06 DIAGNOSIS — R2681 Unsteadiness on feet: Secondary | ICD-10-CM | POA: Diagnosis not present

## 2019-02-06 DIAGNOSIS — I69351 Hemiplegia and hemiparesis following cerebral infarction affecting right dominant side: Secondary | ICD-10-CM | POA: Diagnosis not present

## 2019-02-06 DIAGNOSIS — R2689 Other abnormalities of gait and mobility: Secondary | ICD-10-CM | POA: Diagnosis not present

## 2019-02-06 DIAGNOSIS — R278 Other lack of coordination: Secondary | ICD-10-CM

## 2019-02-06 DIAGNOSIS — Z8673 Personal history of transient ischemic attack (TIA), and cerebral infarction without residual deficits: Secondary | ICD-10-CM | POA: Diagnosis not present

## 2019-02-06 NOTE — Therapy (Signed)
[place] MAIN [facility] SERVICES [address] [place], [place], [address] Phone: [phone]   Fax:  [phone]  Physical Therapy Treatment  Patient Details  Name: Kristin Solis MRN: [id number] Date of Birth: [birth year] Referring Provider (PT): [name]   Encounter Date: 02/06/2019  PT End of Session - 02/06/19 1245    Visit Number  15    Number of Visits  25    Date for PT Re-Evaluation  03/07/19    Equipment Utilized During Treatment  Gait belt    Activity Tolerance  Patient tolerated treatment well;No increased pain;Patient limited by fatigue    Behavior During Therapy  [facility] for tasks assessed/performed       Past Medical History:  Diagnosis Date  . Asthma   . Hypertension   . Stroke [facility])     Past Surgical History:  Procedure Laterality Date  . ABDOMINAL HYSTERECTOMY  2005   total  . IR CT HEAD LTD  04/18/2018  . IR INTRAVSC STENT CERV CAROTID W/O EMB-PROT MOD SED INC ANGIO  04/18/2018  . IR PERCUTANEOUS ART THROMBECTOMY/INFUSION INTRACRANIAL INC DIAG ANGIO  04/18/2018  . OOPHORECTOMY    . RADIOLOGY WITH ANESTHESIA N/A 04/17/2018   Procedure: RADIOLOGY WITH ANESTHESIA;  Surgeon: Luanne Bras, MD;  Location: [place];  Service: Radiology;  Laterality: N/A;    There were no vitals filed for this visit.  Subjective Assessment - 02/06/19 1243    Subjective  Patient reported that she is doing well today, no falls or stumbles. Pt reported that she is moving her right hand better.    Patient is accompained by:  Family member    Pertinent History  Patient is a 63 y.o. female with history of stroke receiving care previously from [facility] at [facility] She was initially admitted to ED on 04/17/18 with dysphagia and right sided weakness s/p left MCA CVA, extubated on 04/19/18 and discharged on 04/23/18 to private residence. Was independent with ADLs/IADLs prior to admission. Inpatient PT reported decreased safety awareness and  awareness of deficits, with deficits in the areas of balance, ambulation ability, transfers. Was able to ambulate 125 feet in hospital with min guard without AD, and independent with bed mobility.    Limitations  Lifting;Standing;Walking;Writing;House hold activities    How long can you sit comfortably?  As long as desired    How long can you stand comfortably?  5 minutes    How long can you walk comfortably?  Able to grocery shop, requires a seated rest break for a couple of minutes.    Patient Stated Goals  Wants to be able to use right arm and hand, and to feel more confortable with going shopping with her husband.    Currently in Pain?  No/denies    Pain Score  0-No pain    Pain Onset  More than a month ago      Treatment: Nu-step x 5 mins BUE and BLE   Neuromuscular Re-education  1/2 foam roll balance with flat side up 2 mins  x 2 reps 1/2 foam roll balance with flat side down 2 mins  x 2 reps  step up  from foam to 6 inch stool left and right x 20  step up  from floor to 6 inch stool left and right x 20 Leg press 90 lbs x 20 x 3 Leg press heel raises 45 lbs x 20 x 3     Pt educated throughout session about  proper posture and technique with exercises. Improved exercise technique, movement at target joints, use of target muscles after min to mod verbal, visual, tactile cues. CGA and Min to mod verbal cues used throughout with increased in postural sway and LOB most seen with narrow base of support and while on uneven surfaces. Continues to have balance deficits typical with diagnosis. Patient performs intermediate level exercises without pain behaviors and needs verbal cuing for postural alignment and head positioning                      PT Education - 02/06/19 1244    Education Details  safety    Person(s) Educated  Patient    Methods  Explanation;Demonstration;Tactile cues;Verbal cues    Comprehension  Verbalized understanding;Returned demonstration;Verbal cues  required;Tactile cues required       PT Short Term Goals - 01/18/19 1408      PT SHORT TERM GOAL #1   Title  Patient will be independent in initial home exercise program to improve strength/mobility for better functional independence with ADLs.    Baseline  Not made yet., 12/28/18: given a HEP but still needs cues for proper technique    Time  6    Period  Weeks    Status  Partially Met    Target Date  01/24/19      PT SHORT TERM GOAL #2   Title  Patient (> 30 years old) will complete five times sit to stand test in < 15 seconds indicating an increased LE strength and improved balance for decreased falls risk.    Baseline  12/13/18: 17.52 sec; 12/28/18: 13.53 sec without HHA    Time  6    Period  Weeks    Status  Achieved    Target Date  01/24/19        PT Long Term Goals - 01/18/19 1408      PT LONG TERM GOAL #1   Title  Patient will increase 10 meter walk test to >1.2ms as to improve gait speed for better community ambulation and to reduce fall risk.    Baseline  12/13/18: 0.64 m/s with rollator, 0.72 m/s without AD; 12/28/18: 0.84 m/s without AD 11/25: 1.0 m/s without AD    Time  12    Period  Weeks    Status  Achieved      PT LONG TERM GOAL #2   Title  Patient will increase six minute walk test distance to >1000 for progression to community ambulator and improve gait ability.    Baseline  12/13/18: 641ft with rollator; 12/28/18: deferred 11/25: 723 ft without an AD, 01/18/19 640    Time  12    Period  Weeks    Status  Partially Met    Target Date  03/07/19      PT LONG TERM GOAL #3   Title  Patient will be require no assist with ascend/descend 3 steps using Least restrictive assistive device without LOB to improve ability to get in/out of home.    Baseline  Dependent on rail assist to reach front door. 12/28/18: uses rail, one step at a time 11/25: able to ascend/descend step over step with BUE support, able to perform step to pattern with SUE support12/2/20=able to  perform step to pattern with SUE support12    Time  12    Period  Weeks    Status  Partially Met    Target Date  01/18/19  PT LONG TERM GOAL #4   Title  Patient will increase BLE gross strength to 4+/5 as to improve functional strength for independent gait, increased standing tolerance and increased ADL ability.    Baseline  12/13/18: RLE hip weakness; 12/28/18: RLE hip extension: 2+/5, hip abduction/adduction 3-/5, knee grossly 4-/5 11/25: RLE hip extension 3-/5 add/abd 3/5 knee 4/512/2/20=RLE hip extension 3-/5 add/abd 3/5 knee 4/5    Time  12    Period  Weeks    Status  Partially Met      PT LONG TERM GOAL #5   Title  Patient will increase Functional Gait Assessment score to >20/30 as to reduce fall risk and improve dynamic gait safety with community ambulation.    Baseline  12/28/18: 13/24, 01/18/19 13/30    Time  12    Period  Weeks    Status  New    Target Date  03/06/18            Plan - 02/06/19 1246    Clinical Impression Statement  Patient performs static and dynamic standing balance with min assist especially for single leg balance and weight shifting and narrow base of support. She performs standing exercises and leg press with no reports of pain. She will continue to benefit from skilled PT to improve safety and mobiltiy.    Personal Factors and Comorbidities  Age;Comorbidity 2;Fitness;Time since onset of injury/illness/exacerbation    Comorbidities  HTN, Hx of stroke    Examination-Activity Limitations  Bathing;Bed Mobility;Bend;Carry;Dressing;Lift;Locomotion Level;Reach Overhead;Squat;Stairs;Stand;Transfers    Examination-Participation Restrictions  Cleaning;Community Activity;Driving;Laundry;Shop;Yard Work    Rehab Potential  Good    PT Frequency  2x / week    PT Duration  12 weeks    PT Treatment/Interventions  ADLs/Self Care Home Management;Aquatic Therapy;Cryotherapy;Electrical Stimulation;Moist Heat;DME Instruction;Gait training;Stair training;Functional  mobility training;Therapeutic activities;Therapeutic exercise;Balance training;Neuromuscular re-education;Patient/family education;Orthotic Fit/Training;Manual techniques;Compression bandaging;Passive range of motion;Dry needling;Energy conservation;Taping    PT Next Visit Plan  review HEP, balance    PT Home Exercise Plan  No updates this date    Consulted and Agree with Plan of Care  Patient       Patient will benefit from skilled therapeutic intervention in order to improve the following deficits and impairments:  Abnormal gait, Decreased balance, Decreased endurance, Decreased mobility, Difficulty walking, Hypomobility, Decreased range of motion, Improper body mechanics, Decreased activity tolerance, Decreased strength, Impaired UE functional use, Postural dysfunction  Visit Diagnosis: Muscle weakness (generalized)  Hemiplegia and hemiparesis following cerebral infarction affecting right dominant side (HCC)  Other lack of coordination  Unsteadiness on feet  History of stroke  Localized edema  Other abnormalities of gait and mobility     Problem List Patient Active Problem List   Diagnosis Date Noted  . Mixed hyperlipidemia 09/28/2018  . Middle cerebral artery embolism, left 04/18/2018  . History of stroke 04/17/2018  . IFG (impaired fasting glucose) 03/28/2018  . Essential hypertension, benign 01/15/2017  . Asthma 01/15/2017    Alanson Puls, PT DPT 02/06/2019, 12:52 PM  [place] MAIN [facility] SERVICES [address] [place], [place], [address] Phone: [phone]   Fax:  [phone]  Name: Kristin Solis MRN: [id number] Date of Birth: 1955-12-30

## 2019-02-07 ENCOUNTER — Other Ambulatory Visit: Payer: Self-pay

## 2019-02-07 ENCOUNTER — Encounter: Payer: Self-pay | Admitting: Physical Therapy

## 2019-02-07 ENCOUNTER — Ambulatory Visit: Payer: Medicaid Other | Admitting: Physical Therapy

## 2019-02-07 ENCOUNTER — Ambulatory Visit: Payer: Medicaid Other | Admitting: Occupational Therapy

## 2019-02-07 DIAGNOSIS — R278 Other lack of coordination: Secondary | ICD-10-CM

## 2019-02-07 DIAGNOSIS — Z8673 Personal history of transient ischemic attack (TIA), and cerebral infarction without residual deficits: Secondary | ICD-10-CM

## 2019-02-07 DIAGNOSIS — M6281 Muscle weakness (generalized): Secondary | ICD-10-CM

## 2019-02-07 DIAGNOSIS — I69351 Hemiplegia and hemiparesis following cerebral infarction affecting right dominant side: Secondary | ICD-10-CM

## 2019-02-07 DIAGNOSIS — R2681 Unsteadiness on feet: Secondary | ICD-10-CM | POA: Diagnosis not present

## 2019-02-07 DIAGNOSIS — R600 Localized edema: Secondary | ICD-10-CM

## 2019-02-07 DIAGNOSIS — R2689 Other abnormalities of gait and mobility: Secondary | ICD-10-CM

## 2019-02-07 NOTE — Therapy (Signed)
[place] [facility] SERVICES [address] [place], [place], [address] Phone: [phone]   Fax:  [phone]  Physical Therapy Treatment  Patient Details  Name: Kristin Solis MRN: [id number] Date of Birth: Dec 31, 1955 Referring Provider (PT): [name]   Encounter Date: 02/07/2019  PT End of Session - 02/07/19 1215    Visit Number  16    Number of Visits  25    Date for PT Re-Evaluation  03/07/19    PT Start Time  1210    PT Stop Time  1230    PT Time Calculation (min)  20 min    Equipment Utilized During Treatment  Gait belt    Activity Tolerance  Patient tolerated treatment well;No increased pain;Patient limited by fatigue    Behavior During Therapy  [facility] for tasks assessed/performed       Past Medical History:  Diagnosis Date  . Asthma   . Hypertension   . Stroke [facility])     Past Surgical History:  Procedure Laterality Date  . ABDOMINAL HYSTERECTOMY  2005   total  . IR CT HEAD LTD  04/18/2018  . IR INTRAVSC STENT CERV CAROTID W/O EMB-PROT MOD SED INC ANGIO  04/18/2018  . IR PERCUTANEOUS ART THROMBECTOMY/INFUSION INTRACRANIAL INC DIAG ANGIO  04/18/2018  . OOPHORECTOMY    . RADIOLOGY WITH ANESTHESIA N/A 04/17/2018   Procedure: RADIOLOGY WITH ANESTHESIA;  Surgeon: Luanne Bras, MD;  Location: [place];  Service: Radiology;  Laterality: N/A;    There were no vitals filed for this visit.  Subjective Assessment - 02/07/19 1214    Subjective  Patient reported that she is doing well today, no falls or stumbles. Pt reported that she is moving her right hand better.    Patient is accompained by:  Family member    Pertinent History  Patient is a 63 y.o. female with history of stroke receiving care previously from [facility]. She was initially admitted to ED on 04/17/18 with dysphagia and right sided weakness s/p left MCA CVA, extubated on 04/19/18 and discharged on 04/23/18 to private residence. Was independent with  ADLs/IADLs prior to admission. Inpatient PT reported decreased safety awareness and awareness of deficits, with deficits in the areas of balance, ambulation ability, transfers. Was able to ambulate 125 feet in hospital with min guard without AD, and independent with bed mobility.    Limitations  Lifting;Standing;Walking;Writing;House hold activities    How long can you sit comfortably?  As long as desired    How long can you stand comfortably?  5 minutes    How long can you walk comfortably?  Able to grocery shop, requires a seated rest break for a couple of minutes.    Patient Stated Goals  Wants to be able to use right arm and hand, and to feel more confortable with going shopping with her husband.    Pain Onset  More than a month ago       Treatment: Toe taps to 6 inch stool x 20  Step ups to 6 inch stool x 20  Eccentric step downs from 6 inch stool to blue foam x 10  1/2 foam horizontal balance x 2 mins 1/2 foam heel toe x 20 with UE assist. .   Patient performed with instruction, verbal cues, tactile cues of therapist: goal: increase tissue extensibility, promote proper posture, improve mobility  PT Education - 02/07/19 1214    Education Details  saftey    Person(s) Educated  Patient    Methods  Explanation;Demonstration;Tactile cues;Verbal cues    Comprehension  Verbalized understanding;Returned demonstration;Verbal cues required;Tactile cues required;Need further instruction       PT Short Term Goals - 01/18/19 1408      PT SHORT TERM GOAL #1   Title  Patient will be independent in initial home exercise program to improve strength/mobility for better functional independence with ADLs.    Baseline  Not made yet., 12/28/18: given a HEP but still needs cues for proper technique    Time  6    Period  Weeks    Status  Partially Met    Target Date  01/24/19      PT SHORT TERM GOAL #2   Title  Patient (> 22 years old) will complete five  times sit to stand test in < 15 seconds indicating an increased LE strength and improved balance for decreased falls risk.    Baseline  12/13/18: 17.52 sec; 12/28/18: 13.53 sec without HHA    Time  6    Period  Weeks    Status  Achieved    Target Date  01/24/19        PT Long Term Goals - 01/18/19 1408      PT LONG TERM GOAL #1   Title  Patient will increase 10 meter walk test to >1.51ms as to improve gait speed for better community ambulation and to reduce fall risk.    Baseline  12/13/18: 0.64 m/s with rollator, 0.72 m/s without AD; 12/28/18: 0.84 m/s without AD 11/25: 1.0 m/s without AD    Time  12    Period  Weeks    Status  Achieved      PT LONG TERM GOAL #2   Title  Patient will increase six minute walk test distance to >1000 for progression to community ambulator and improve gait ability.    Baseline  12/13/18: 656ft with rollator; 12/28/18: deferred 11/25: 723 ft without an AD, 01/18/19 640    Time  12    Period  Weeks    Status  Partially Met    Target Date  03/07/19      PT LONG TERM GOAL #3   Title  Patient will be require no assist with ascend/descend 3 steps using Least restrictive assistive device without LOB to improve ability to get in/out of home.    Baseline  Dependent on rail assist to reach front door. 12/28/18: uses rail, one step at a time 11/25: able to ascend/descend step over step with BUE support, able to perform step to pattern with SUE support12/2/20=able to perform step to pattern with SUE support12    Time  12    Period  Weeks    Status  Partially Met    Target Date  01/18/19      PT LONG TERM GOAL #4   Title  Patient will increase BLE gross strength to 4+/5 as to improve functional strength for independent gait, increased standing tolerance and increased ADL ability.    Baseline  12/13/18: RLE hip weakness; 12/28/18: RLE hip extension: 2+/5, hip abduction/adduction 3-/5, knee grossly 4-/5 11/25: RLE hip extension 3-/5 add/abd 3/5 knee 4/512/2/20=RLE  hip extension 3-/5 add/abd 3/5 knee 4/5    Time  12    Period  Weeks    Status  Partially Met      PT LONG TERM GOAL #  5   Title  Patient will increase Functional Gait Assessment score to >20/30 as to reduce fall risk and improve dynamic gait safety with community ambulation.    Baseline  12/28/18: 13/24, 01/18/19 13/30    Time  12    Period  Weeks    Status  New    Target Date  03/06/18            Plan - 02/07/19 1215    Clinical Impression Statement  Patient has difficulty with eccentric step downs form 6 inch stool to blue foam pad tapping heel. She has decreased stamina and is out of breath. She is able to perform standing exercises with a rest period. She will continue to benefit from skilled PT to improve mobiltiy and strength.    Personal Factors and Comorbidities  Age;Comorbidity 2;Fitness;Time since onset of injury/illness/exacerbation    Comorbidities  HTN, Hx of stroke    Examination-Activity Limitations  Bathing;Bed Mobility;Bend;Carry;Dressing;Lift;Locomotion Level;Reach Overhead;Squat;Stairs;Stand;Transfers    Examination-Participation Restrictions  Cleaning;Community Activity;Driving;Laundry;Shop;Yard Work    Rehab Potential  Good    PT Frequency  2x / week    PT Duration  12 weeks    PT Treatment/Interventions  ADLs/Self Care Home Management;Aquatic Therapy;Cryotherapy;Electrical Stimulation;Moist Heat;DME Instruction;Gait training;Stair training;Functional mobility training;Therapeutic activities;Therapeutic exercise;Balance training;Neuromuscular re-education;Patient/family education;Orthotic Fit/Training;Manual techniques;Compression bandaging;Passive range of motion;Dry needling;Energy conservation;Taping    PT Next Visit Plan  review HEP, balance    PT Home Exercise Plan  No updates this date    Consulted and Agree with Plan of Care  Patient       Patient will benefit from skilled therapeutic intervention in order to improve the following deficits and  impairments:  Abnormal gait, Decreased balance, Decreased endurance, Decreased mobility, Difficulty walking, Hypomobility, Decreased range of motion, Improper body mechanics, Decreased activity tolerance, Decreased strength, Impaired UE functional use, Postural dysfunction  Visit Diagnosis: Muscle weakness (generalized)  Hemiplegia and hemiparesis following cerebral infarction affecting right dominant side (HCC)  Other lack of coordination  Unsteadiness on feet  History of stroke  Localized edema  Other abnormalities of gait and mobility     Problem List Patient Active Problem List   Diagnosis Date Noted  . Mixed hyperlipidemia 09/28/2018  . Middle cerebral artery embolism, left 04/18/2018  . History of stroke 04/17/2018  . IFG (impaired fasting glucose) 03/28/2018  . Essential hypertension, benign 01/15/2017  . Asthma 01/15/2017    Alanson Puls, PT DPT 02/07/2019, 12:29 PM  [place] MA[facility] SERVICES [address] [place], [place], [address] Phone: [phone]   Fax:  [phone]  Name: LEORA PLATT MRN: [id number] Date of Birth: Jan 24, 1956

## 2019-02-08 ENCOUNTER — Encounter: Payer: Self-pay | Admitting: Occupational Therapy

## 2019-02-08 NOTE — Therapy (Signed)
[place] [facility]IN [facility] SERVICES [address] [place], [place], [address] Phone: [phone]   Fax:  [phone]  Occupational Therapy Treatment  Patient Details  Name: Kristin Solis MRN: [id number] Date of Birth: 1955-10-21 Referring Provider (OT): Marnee Guarneri   Encounter Date: 02/07/2019  OT End of Session - 02/08/19 1600    Visit Number  14    Number of Visits  37    Date for OT Re-Evaluation  04/28/18    OT Start Time  1230    OT Stop Time  1317    OT Time Calculation (min)  47 min    Activity Tolerance  Patient tolerated treatment well    Behavior During Therapy  [facility] for tasks assessed/performed       Past Medical History:  Diagnosis Date  . Asthma   . Hypertension   . Stroke [facility])     Past Surgical History:  Procedure Laterality Date  . ABDOMINAL HYSTERECTOMY  2005   total  . IR CT HEAD LTD  04/18/2018  . IR INTRAVSC STENT CERV CAROTID W/O EMB-PROT MOD SED INC ANGIO  04/18/2018  . IR PERCUTANEOUS ART THROMBECTOMY/INFUSION INTRACRANIAL INC DIAG ANGIO  04/18/2018  . OOPHORECTOMY    . RADIOLOGY WITH ANESTHESIA N/A 04/17/2018   Procedure: RADIOLOGY WITH ANESTHESIA;  Surgeon: Luanne Bras, MD;  Location: [place];  Service: Radiology;  Laterality: N/A;    There were no vitals filed for this visit.  Subjective Assessment - 02/08/19 1600    Subjective   Patient reports she was able to write out a few Christmas cards with using a regular pen and gross grasp with good legibility.  5 Cards, in 30 mins.  Went to [employer] yesterday and was able to push the cart for about 20 mins but had some wrist pain at the end on right side.  She also reports being able to push the button on the elevator with her right hand.    Pertinent History  Patient is a 63 y.o. female with history of stroke,  admitted to ED on 04/17/18 with dysphagia and right sided weakness s/p left MCA CVA, extubated on 04/19/18 and discharged on 04/23/18 to private residence    Patient Stated Goals  Patient reports she would like to get her right hand to move more and be able to use for daily tasks.    Currently in Pain?  No/denies    Pain Score  0-No pain        Patient reports she was able to write out a few Christmas cards with using a regular pen and gross grasp with good legibility.  5 Cards, in 30 mins.  Went to [employer] yesterday and was able to push the cart for about 20 mins but had some wrist pain at the end on right side.  She also reports being able to push the button on the elevator with her right hand.    Patient seen this date for PROM to right hand and wrist followed by AAROM/AROM for finger flexion and extension with 10 sec hold with fisting, cues for index finger to continue with increased flexion.  Patient able to touch her palm with all fingers except index but improving for composite fist.  Oppositional movements of thumb to digits, able to achieve to the lateral side of the ring finger and has not yet achieved to small finger actively.  Patient seen for shoulder ROM with use of wedge for ROM in a more  elevated plane of motion, verbal cues for direction and for elbow extension to reach right upper corner.  Movement to all 4 corners with cues. Washcloth under hand to decrease friction.    Patient seen for Handwriting on a card with right hand and use of regular pen and large built up pen, she prefers the regular pen for the task, difficulty with maintaining secure grip but able to grossly hold and make legible handwriting strokes.  Glass beads and to reach and place into container.      Issued new isotoner glove size small for right hand for edema control, has open fingers so she can continue to use her hand for tasks during the day while wearing the glove.  She is aware the seams go on the outside of the glove and is able to demonstrate donning and doffing.       Response to tx:  Patient continues to make excellent progress, she would benefit  from continued Occupational Therapy in the future to work towards edema reduction, pain reduction and increased functional use, strength and coordination of right UE however she has utilized all her therapy visits for this calendar year.  Patient has made significant progress in the last few weeks and was educated on continued exercises and edema control measures at home over the next week.  Continue OT to maximize safety and independence in daily tasks.          OT Education - 02/08/19 1600    Education Details  handwriting, grasping, isotoner glove    Person(s) Educated  Patient    Methods  Explanation;Demonstration    Comprehension  Verbalized understanding;Returned demonstration          OT Long Term Goals - 02/03/19 2774      OT LONG TERM GOAL #1   Title  Patient will complete home exercise program with modified independence.    Baseline  addition of new exercises ongoing as patient progresses with RUE.    Time  12    Period  Weeks    Status  On-going    Target Date  04/28/19      OT LONG TERM GOAL #2   Title  Patient will demonstrate understanding of Edema control measures for her right upper extremity and implement into daily routine.    Baseline  able to demo understanding and methods of edema control    Time  3    Period  Weeks    Status  Achieved      OT LONG TERM GOAL #3   Title  Patient will demonstrate improved grip by 5 # in right Hand to be able to hold items in hand with gross grasp.    Baseline  2# on right, unable to grasp items, UPdate on 02/02/19 patient has 6# and able to grasp select items depending on size and weight of item    Time  6    Period  Weeks    Status  On-going    Target Date  03/03/19      OT LONG TERM GOAL #4   Title  Patient will increase range of motion of fingers in right hand to be able to grasp built-up utensils for self feeding with modified independence.    Baseline  unable to feed self or hold utensils with right hand    Time   6    Period  Months    Status  Achieved      OT LONG TERM GOAL #  5   Title  Patient will improve right hand function to be able to grasp pen to sign name on important papers with fair legibility.    Baseline  unable to hold pen or sign name, Update:  able to grasp large built up pen and starting to form strokes for handwriting    Time  12    Period  Weeks    Status  On-going    Target Date  04/28/19      Long Term Additional Goals   Additional Long Term Goals  Yes      OT LONG TERM GOAL #6   Title  Patient will hold a cup with modified independence.    Baseline  unable at eval with right hand, update on 02/02/19:  patient able to gross hold cup but has difficulty with maintaining grasp and depending on the weight and contents.    Time  12    Period  Weeks    Status  On-going    Target Date  04/28/19      OT LONG TERM GOAL #7   Title  Patient will improve right hand function to manipulate buttons, snaps, zippers with modified independence.    Baseline  difficulty at eval and requires assist at times.  Update:  can do some zippers but no buttons to date    Time  12    Period  Weeks    Status  On-going    Target Date  04/28/19      OT LONG TERM GOAL #8   Title  Patient will demonstrate the ability to stir items in a bowl of varying consistencies with modified independence.    Baseline  unable 02/02/2019    Time  12    Period  Weeks    Status  New    Target Date  04/28/19      OT LONG TERM GOAL  #9   TITLE  Patient will demonstrate the ability to move pots and pans from one location to another in the kitchen with modified independence.    Baseline  02/02/19 unable    Time  12    Period  Weeks    Status  New    Target Date  04/28/19      OT LONG TERM GOAL  #10   TITLE  Patient will improve right shoulder flexion by 10 degrees to assist with reaching and placing objects on table.    Baseline  02/02/2019:  right shoulder flexion 45 degrees    Time  12    Period  Weeks     Status  New    Target Date  04/28/19            Plan - 02/08/19 1601    Clinical Impression Statement  Patient continues to make excellent progress, she would benefit from continued Occupational Therapy in the future to work towards edema reduction, pain reduction and increased functional use, strength and coordination of right UE however she has utilized all her therapy visits for this calendar year.  Patient has made significant progress in the last few weeks and was educated on continued exercises and edema control measures at home over the next week.  Continue OT to maximize safety and independence in daily tasks.    OT Occupational Profile and History  Detailed Assessment- Review of Records and additional review of physical, cognitive, psychosocial history related to current functional performance    Occupational performance deficits (Please refer to evaluation for  details):  ADL's;IADL's;Work;Leisure    Body Structure / Function / Physical Skills  ADL;Coordination;GMC;UE functional use;Balance;Decreased knowledge of use of DME;Flexibility;IADL;Dexterity;FMC;Proprioception;Strength;Edema;ROM    Cognitive Skills  Memory    Psychosocial Skills  Environmental  Adaptations;Habits;Routines and Behaviors    Rehab Potential  Good    Clinical Decision Making  Limited treatment options, no task modification necessary    Comorbidities Affecting Occupational Performance:  May have comorbidities impacting occupational performance    Modification or Assistance to Complete Evaluation   No modification of tasks or assist necessary to complete eval    OT Frequency  2x / week    OT Duration  12 weeks    OT Treatment/Interventions  Self-care/ADL training;Cryotherapy;Therapeutic exercise;DME and/or AE instruction;Functional Mobility Training;Cognitive remediation/compensation;Balance training;Electrical Stimulation;Neuromuscular education;Manual Therapy;Splinting;Moist Heat;Contrast Bath;Therapeutic  activities;Patient/family education;Passive range of motion    Consulted and Agree with Plan of Care  Patient       Patient will benefit from skilled therapeutic intervention in order to improve the following deficits and impairments:   Body Structure / Function / Physical Skills: ADL, Coordination, GMC, UE functional use, Balance, Decreased knowledge of use of DME, Flexibility, IADL, Dexterity, FMC, Proprioception, Strength, Edema, ROM Cognitive Skills: Memory Psychosocial Skills: Environmental  Adaptations, Habits, Routines and Behaviors   Visit Diagnosis: Muscle weakness (generalized)  Hemiplegia and hemiparesis following cerebral infarction affecting right dominant side (HCC)  Other lack of coordination    Problem List Patient Active Problem List   Diagnosis Date Noted  . Mixed hyperlipidemia 09/28/2018  . Middle cerebral artery embolism, left 04/18/2018  . History of stroke 04/17/2018  . IFG (impaired fasting glucose) 03/28/2018  . Essential hypertension, benign 01/15/2017  . Asthma 01/15/2017   Everrett Lacasse T Arne Cleveland, OTR/L, CLT  Joannah Gitlin 02/09/2019, 9:26 AM  [place] [facility] MAIN [facility] SERVICES [address] [place], [place], [address] Phone: [phone]   Fax:  [phone]  Name: Kristin Solis MRN: [id number] Date of Birth: 10-May-1955

## 2019-02-08 NOTE — Therapy (Signed)
[place] [facility] MAIN [place] SERVICES [address] [place], [place], [address] Phone: [phone]   Fax:  [phone]  Occupational Therapy Treatment  Patient Details  Name: Kristin Solis MRN: [id number] Date of Birth: 05/01/1955 Referring Provider (OT): Aura Dials   Encounter Date: 02/06/2019  OT End of Session - 02/08/19 1131    Visit Number  13    Number of Visits  37    Date for OT Re-Evaluation  04/28/18    OT Start Time  1058    OT Stop Time  1145    OT Time Calculation (min)  47 min    Activity Tolerance  Patient tolerated treatment well    Behavior During Therapy  [facility] for tasks assessed/performed       Past Medical History:  Diagnosis Date  . Asthma   . Hypertension   . Stroke [facility])     Past Surgical History:  Procedure Laterality Date  . ABDOMINAL HYSTERECTOMY  2005   total  . IR CT HEAD LTD  04/18/2018  . IR INTRAVSC STENT CERV CAROTID W/O EMB-PROT MOD SED INC ANGIO  04/18/2018  . IR PERCUTANEOUS ART THROMBECTOMY/INFUSION INTRACRANIAL INC DIAG ANGIO  04/18/2018  . OOPHORECTOMY    . RADIOLOGY WITH ANESTHESIA N/A 04/17/2018   Procedure: RADIOLOGY WITH ANESTHESIA;  Surgeon: Julieanne Cotton, MD;  Location: [facility] OR;  Service: Radiology;  Laterality: N/A;    There were no vitals filed for this visit.  Subjective Assessment - 02/08/19 1129    Subjective   Patient reports she is trying to use her right hand as much as possible with grasping items at home, feeding herself and working on edema reduction.    Pertinent History  Patient is a 63 y.o. female with history of stroke,  admitted to ED on 04/17/18 with dysphagia and right sided weakness s/p left MCA CVA, extubated on 04/19/18 and discharged on 04/23/18 to private residence    Patient Stated Goals  Patient reports she would like to get her right hand to move more and be able to use for daily tasks.    Currently in Pain?  Yes    Pain Score  3     Pain Location  Shoulder    Pain  Orientation  Right    Pain Descriptors / Indicators  Aching    Pain Type  Chronic pain    Pain Onset  More than a month ago    Pain Frequency  Intermittent       Circumferential measurements of right hand this date:   7.2 thumb  7.3 index 18.7 MP 20.4 dorsum 18.9 wrist  Patient seen for PROM of right hand for finger flexion, wrist flexion/extension, supination, shoulder flexion.  Followed by AAROM/AROM of right UE with forward reaching patterns with tabletop support, supination/pronation to turn objects, Finger flexion with fisting followed by finger extension fully.  Fisting with 10 sec hold for multiple trials.     Patient seen for manipulation of objects with emphasis on Thumb finger combos with cards, fingers on top/thumb on bottom flipping towards her, then thumb on top/fingers on bottom to flip away from her.  Performed for multiple repetitions for each movement and then switched to alternating movement patterns and then added reach to place cards in the middle of the table. Patient picking up pegs and putting into judy board reaching to place on top row.  Cues for reach, cues to extend at the elbow and to decrease substitution of  movements with attempts at reaching involving shoulder.    Response to tx:   Patient continues to make good progress, edema remains present and fluctuates daily and also depends on how much patient is up and has arm hanging in a dependent position. We discussed positioning especially during times she is not using her hand/arm.  Patient continues to work towards being able to hold items in her right hand and starting to work towards more isolated finger movements for manipulation of items.  Continue to work towards goals to further decrease edema, decrease pain, increase motion and improve functional use of patient's dominant arm and hand for necessary daily tasks.                      OT Education - 02/08/19 1131    Education Details   grasping, functional tasks    Person(s) Educated  Patient    Methods  Explanation;Demonstration    Comprehension  Verbalized understanding;Returned demonstration          OT Long Term Goals - 02/03/19 0838      OT LONG TERM GOAL #1   Title  Patient will complete home exercise program with modified independence.    Baseline  addition of new exercises ongoing as patient progresses with RUE.    Time  12    Period  Weeks    Status  On-going    Target Date  04/28/19      OT LONG TERM GOAL #2   Title  Patient will demonstrate understanding of Edema control measures for her right upper extremity and implement into daily routine.    Baseline  able to demo understanding and methods of edema control    Time  3    Period  Weeks    Status  Achieved      OT LONG TERM GOAL #3   Title  Patient will demonstrate improved grip by 5 # in right Hand to be able to hold items in hand with gross grasp.    Baseline  2# on right, unable to grasp items, UPdate on 02/02/19 patient has 6# and able to grasp select items depending on size and weight of item    Time  6    Period  Weeks    Status  On-going    Target Date  03/03/19      OT LONG TERM GOAL #4   Title  Patient will increase range of motion of fingers in right hand to be able to grasp built-up utensils for self feeding with modified independence.    Baseline  unable to feed self or hold utensils with right hand    Time  6    Period  Months    Status  Achieved      OT LONG TERM GOAL #5   Title  Patient will improve right hand function to be able to grasp pen to sign name on important papers with fair legibility.    Baseline  unable to hold pen or sign name, Update:  able to grasp large built up pen and starting to form strokes for handwriting    Time  12    Period  Weeks    Status  On-going    Target Date  04/28/19      Long Term Additional Goals   Additional Long Term Goals  Yes      OT LONG TERM GOAL #6   Title  Patient will hold a  cup  with modified independence.    Baseline  unable at eval with right hand, update on 02/02/19:  patient able to gross hold cup but has difficulty with maintaining grasp and depending on the weight and contents.    Time  12    Period  Weeks    Status  On-going    Target Date  04/28/19      OT LONG TERM GOAL #7   Title  Patient will improve right hand function to manipulate buttons, snaps, zippers with modified independence.    Baseline  difficulty at eval and requires assist at times.  Update:  can do some zippers but no buttons to date    Time  12    Period  Weeks    Status  On-going    Target Date  04/28/19      OT LONG TERM GOAL #8   Title  Patient will demonstrate the ability to stir items in a bowl of varying consistencies with modified independence.    Baseline  unable 02/02/2019    Time  12    Period  Weeks    Status  New    Target Date  04/28/19      OT LONG TERM GOAL  #9   TITLE  Patient will demonstrate the ability to move pots and pans from one location to another in the kitchen with modified independence.    Baseline  02/02/19 unable    Time  12    Period  Weeks    Status  New    Target Date  04/28/19      OT LONG TERM GOAL  #10   TITLE  Patient will improve right shoulder flexion by 10 degrees to assist with reaching and placing objects on table.    Baseline  02/02/2019:  right shoulder flexion 45 degrees    Time  12    Period  Weeks    Status  New    Target Date  04/28/19            Plan - 02/08/19 1132    Clinical Impression Statement  Patient continues to make good progress, edema remains present and fluctuates daily and also depends on how much patient is up and has arm hanging in a dependent position. We discussed positioning especially during times she is not using her hand/arm.  Patient continues to work towards being able to hold items in her right hand and starting to work towards more isolated finger movements for manipulation of items.  Continue  to work towards goals to further decrease edema, decrease pain, increase motion and improve functional use of patient's dominant arm and hand for necessary daily tasks.    OT Occupational Profile and History  Detailed Assessment- Review of Records and additional review of physical, cognitive, psychosocial history related to current functional performance    Occupational performance deficits (Please refer to evaluation for details):  ADL's;IADL's;Work;Leisure    Body Structure / Function / Physical Skills  ADL;Coordination;GMC;UE functional use;Balance;Decreased knowledge of use of DME;Flexibility;IADL;Dexterity;FMC;Proprioception;Strength;Edema;ROM    Cognitive Skills  Memory    Psychosocial Skills  Environmental  Adaptations;Habits;Routines and Behaviors    Rehab Potential  Good    Clinical Decision Making  Limited treatment options, no task modification necessary    Comorbidities Affecting Occupational Performance:  May have comorbidities impacting occupational performance    Modification or Assistance to Complete Evaluation   No modification of tasks or assist necessary to complete eval    OT Frequency  2x / week    OT Duration  12 weeks    OT Treatment/Interventions  Self-care/ADL training;Cryotherapy;Therapeutic exercise;DME and/or AE instruction;Functional Mobility Training;Cognitive remediation/compensation;Balance training;Electrical Stimulation;Neuromuscular education;Manual Therapy;Splinting;Moist Heat;Contrast Bath;Therapeutic activities;Patient/family education;Passive range of motion    Consulted and Agree with Plan of Care  Patient       Patient will benefit from skilled therapeutic intervention in order to improve the following deficits and impairments:   Body Structure / Function / Physical Skills: ADL, Coordination, GMC, UE functional use, Balance, Decreased knowledge of use of DME, Flexibility, IADL, Dexterity, FMC, Proprioception, Strength, Edema, ROM Cognitive Skills:  Memory Psychosocial Skills: Environmental  Adaptations, Habits, Routines and Behaviors   Visit Diagnosis: Muscle weakness (generalized)  Hemiplegia and hemiparesis following cerebral infarction affecting right dominant side (HCC)  Other lack of coordination    Problem List Patient Active Problem List   Diagnosis Date Noted  . Mixed hyperlipidemia 09/28/2018  . Middle cerebral artery embolism, left 04/18/2018  . History of stroke 04/17/2018  . IFG (impaired fasting glucose) 03/28/2018  . Essential hypertension, benign 01/15/2017  . Asthma 01/15/2017  Kristin Solis, OTR/L, CLT   Tahjanae Blankenburg 02/08/2019, 11:44 AM  [place] MAIN [facility] SERVICES [address] [place], [place], [address] Phone: [phone]   Fax:  [phone]  Name: KATURA EATHERLY MRN: [id number] Date of Birth: August 30, 1955

## 2019-02-13 ENCOUNTER — Encounter: Payer: Medicaid Other | Admitting: Occupational Therapy

## 2019-02-13 ENCOUNTER — Ambulatory Visit: Payer: Medicaid Other | Admitting: Occupational Therapy

## 2019-02-13 ENCOUNTER — Ambulatory Visit: Payer: Medicaid Other | Admitting: Physical Therapy

## 2019-02-14 ENCOUNTER — Encounter: Payer: Medicaid Other | Admitting: Occupational Therapy

## 2019-02-16 ENCOUNTER — Encounter: Payer: Medicaid Other | Admitting: Occupational Therapy

## 2019-02-16 ENCOUNTER — Ambulatory Visit: Payer: Medicaid Other | Admitting: Physical Therapy

## 2019-02-22 ENCOUNTER — Other Ambulatory Visit: Payer: Self-pay

## 2019-02-22 ENCOUNTER — Ambulatory Visit: Payer: Medicaid Other | Attending: Nurse Practitioner

## 2019-02-22 ENCOUNTER — Encounter: Payer: Self-pay | Admitting: Occupational Therapy

## 2019-02-22 ENCOUNTER — Ambulatory Visit: Payer: Medicaid Other | Admitting: Occupational Therapy

## 2019-02-22 DIAGNOSIS — M6281 Muscle weakness (generalized): Secondary | ICD-10-CM | POA: Insufficient documentation

## 2019-02-22 DIAGNOSIS — R2681 Unsteadiness on feet: Secondary | ICD-10-CM | POA: Diagnosis not present

## 2019-02-22 DIAGNOSIS — I69351 Hemiplegia and hemiparesis following cerebral infarction affecting right dominant side: Secondary | ICD-10-CM | POA: Insufficient documentation

## 2019-02-22 DIAGNOSIS — R2689 Other abnormalities of gait and mobility: Secondary | ICD-10-CM

## 2019-02-22 DIAGNOSIS — R600 Localized edema: Secondary | ICD-10-CM | POA: Diagnosis not present

## 2019-02-22 DIAGNOSIS — Z8673 Personal history of transient ischemic attack (TIA), and cerebral infarction without residual deficits: Secondary | ICD-10-CM | POA: Diagnosis not present

## 2019-02-22 DIAGNOSIS — R278 Other lack of coordination: Secondary | ICD-10-CM | POA: Insufficient documentation

## 2019-02-22 NOTE — Therapy (Signed)
[place] MAIN [facility] SERVICES [address] [place], [place], [address] Phone: [phone]   Fax:  [phone]  Physical Therapy Treatment   Patient Details  Name: Kristin Solis MRN: [id number] Date of Birth: 1955-05-03 Referring Provider (PT): [name]   Encounter Date: 02/22/2019  PT End of Session - 02/22/19 1536    Visit Number  17    Number of Visits  25    Date for PT Re-Evaluation  03/07/19    Authorization Type  eval 10/27    Authorization - Visit Number  1    Authorization - Number of Visits  12    PT Start Time  1510    PT Stop Time  1546    PT Time Calculation (min)  36 min    Equipment Utilized During Treatment  Gait belt    Activity Tolerance  Patient tolerated treatment well;No increased pain;Patient limited by fatigue    Behavior During Therapy  [facility] for tasks assessed/performed       Past Medical History:  Diagnosis Date  . Asthma   . Hypertension   . Stroke [facility])     Past Surgical History:  Procedure Laterality Date  . ABDOMINAL HYSTERECTOMY  2005   total  . IR CT HEAD LTD  04/18/2018  . IR INTRAVSC STENT CERV CAROTID W/O EMB-PROT MOD SED INC ANGIO  04/18/2018  . IR PERCUTANEOUS ART THROMBECTOMY/INFUSION INTRACRANIAL INC DIAG ANGIO  04/18/2018  . OOPHORECTOMY    . RADIOLOGY WITH ANESTHESIA N/A 04/17/2018   Procedure: RADIOLOGY WITH ANESTHESIA;  Surgeon: Luanne Bras, MD;  Location: [place];  Service: Radiology;  Laterality: N/A;    There were no vitals filed for this visit.  Subjective Assessment - 02/22/19 1513    Subjective  Patient reported compliance with HEP, stated she is doing well today, no changes to medications, falls, or stumbles    Patient is accompained by:  Family member    Pertinent History  Patient is a 64 y.o. female with history of stroke receiving care previously from [facility]. She was initially admitted to ED on 04/17/18 with dysphagia and right sided weakness s/p  left MCA CVA, extubated on 04/19/18 and discharged on 04/23/18 to private residence. Was independent with ADLs/IADLs prior to admission. Inpatient PT reported decreased safety awareness and awareness of deficits, with deficits in the areas of balance, ambulation ability, transfers. Was able to ambulate 125 feet in hospital with min guard without AD, and independent with bed mobility.    Limitations  Lifting;Standing;Walking;Writing;House hold activities    How long can you sit comfortably?  As long as desired    How long can you stand comfortably?  5 minutes    How long can you walk comfortably?  Able to grocery shop, requires a seated rest break for a couple of minutes.    Patient Stated Goals  Wants to be able to use right arm and hand, and to feel more confortable with going shopping with her husband.    Currently in Pain?  No/denies       Treatment:   therapeutic exercise: Nu-step x 4 mins BUE and BLE level 2-3, cues for SPM >60 and then over 50 due to fatigue Leg press 90 lbs x 20 x 3 Leg press heel raises 45 lbs x 20 x 3  Step ups to 6 inch stool x 20 BUE x10, 1 hand x10    NMR: Toe taps to 6 inch  stool x 20 bilaterally with CGA Lateral taps 2x10 bilaterally CGA, one LOB noted, minA and UE support to correct  .  Patient performed with instruction, verbal cues, tactile cues of therapist: goal: increase tissue extensibility, promote proper posture, improve mobility. CGA- minA throughout session.   Pt response/clinical impression: The patient was highly motivated at start of session to perform therapeutic exercise. With time and exercise repetition pt exhibited and reported fatigue. MinA 1-2 instances during session due to mild LOB during dynamic balance activities. The patient needed verbal/tactile cues for proper exercise form/technique, able to decrease cues needed with repetition. The patient would benefit from further skilled PT intervention to progress towards goals as  able.     PT Education - 02/22/19 1513    Education Details  exercise form/technique    Person(s) Educated  Patient    Methods  Explanation;Demonstration;Tactile cues;Verbal cues    Comprehension  Verbalized understanding;Returned demonstration;Verbal cues required;Tactile cues required       PT Short Term Goals - 01/18/19 1408      PT SHORT TERM GOAL #1   Title  Patient will be independent in initial home exercise program to improve strength/mobility for better functional independence with ADLs.    Baseline  Not made yet., 12/28/18: given a HEP but still needs cues for proper technique    Time  6    Period  Weeks    Status  Partially Met    Target Date  01/24/19      PT SHORT TERM GOAL #2   Title  Patient (> 13 years old) will complete five times sit to stand test in < 15 seconds indicating an increased LE strength and improved balance for decreased falls risk.    Baseline  12/13/18: 17.52 sec; 12/28/18: 13.53 sec without HHA    Time  6    Period  Weeks    Status  Achieved    Target Date  01/24/19        PT Long Term Goals - 01/18/19 1408      PT LONG TERM GOAL #1   Title  Patient will increase 10 meter walk test to >1.34ms as to improve gait speed for better community ambulation and to reduce fall risk.    Baseline  12/13/18: 0.64 m/s with rollator, 0.72 m/s without AD; 12/28/18: 0.84 m/s without AD 11/25: 1.0 m/s without AD    Time  12    Period  Weeks    Status  Achieved      PT LONG TERM GOAL #2   Title  Patient will increase six minute walk test distance to >1000 for progression to community ambulator and improve gait ability.    Baseline  12/13/18: 629ft with rollator; 12/28/18: deferred 11/25: 723 ft without an AD, 01/18/19 640    Time  12    Period  Weeks    Status  Partially Met    Target Date  03/07/19      PT LONG TERM GOAL #3   Title  Patient will be require no assist with ascend/descend 3 steps using Least restrictive assistive device without LOB to  improve ability to get in/out of home.    Baseline  Dependent on rail assist to reach front door. 12/28/18: uses rail, one step at a time 11/25: able to ascend/descend step over step with BUE support, able to perform step to pattern with SUE support12/2/20=able to perform step to pattern with SUE support12    Time  12  Period  Weeks    Status  Partially Met    Target Date  01/18/19      PT LONG TERM GOAL #4   Title  Patient will increase BLE gross strength to 4+/5 as to improve functional strength for independent gait, increased standing tolerance and increased ADL ability.    Baseline  12/13/18: RLE hip weakness; 12/28/18: RLE hip extension: 2+/5, hip abduction/adduction 3-/5, knee grossly 4-/5 11/25: RLE hip extension 3-/5 add/abd 3/5 knee 4/512/2/20=RLE hip extension 3-/5 add/abd 3/5 knee 4/5    Time  12    Period  Weeks    Status  Partially Met      PT LONG TERM GOAL #5   Title  Patient will increase Functional Gait Assessment score to >20/30 as to reduce fall risk and improve dynamic gait safety with community ambulation.    Baseline  12/28/18: 13/24, 01/18/19 13/30    Time  12    Period  Weeks    Status  New    Target Date  03/06/18            Plan - 02/22/19 1544    Clinical Impression Statement  The patient was highly motivated at start of session to perform therapeutic exercise. With time and exercise repetition pt exhibited and reported fatigue. MinA 1-2 instances during session due to mild LOB during dynamic balance activities. The patient needed verbal/tactile cues for proper exercise form/technique, able to decrease cues needed with repetition. The patient would benefit from further skilled PT intervention to progress towards goals as able.    Personal Factors and Comorbidities  Age;Comorbidity 2;Fitness;Time since onset of injury/illness/exacerbation    Comorbidities  HTN, Hx of stroke    Examination-Activity Limitations  Bathing;Bed  Mobility;Bend;Carry;Dressing;Lift;Locomotion Level;Reach Overhead;Squat;Stairs;Stand;Transfers    Examination-Participation Restrictions  Cleaning;Community Activity;Driving;Laundry;Shop;Yard Work    Stability/Clinical Decision Making  Stable/Uncomplicated    Rehab Potential  Good    PT Frequency  2x / week    PT Duration  12 weeks    PT Treatment/Interventions  ADLs/Self Care Home Management;Aquatic Therapy;Cryotherapy;Electrical Stimulation;Moist Heat;DME Instruction;Gait training;Stair training;Functional mobility training;Therapeutic activities;Therapeutic exercise;Balance training;Neuromuscular re-education;Patient/family education;Orthotic Fit/Training;Manual techniques;Compression bandaging;Passive range of motion;Dry needling;Energy conservation;Taping    PT Next Visit Plan  review HEP, balance    PT Home Exercise Plan  No updates this date    Consulted and Agree with Plan of Care  Patient       Patient will benefit from skilled therapeutic intervention in order to improve the following deficits and impairments:  Abnormal gait, Decreased balance, Decreased endurance, Decreased mobility, Difficulty walking, Hypomobility, Decreased range of motion, Improper body mechanics, Decreased activity tolerance, Decreased strength, Impaired UE functional use, Postural dysfunction  Visit Diagnosis: Muscle weakness (generalized)  History of stroke  Hemiplegia and hemiparesis following cerebral infarction affecting right dominant side (HCC)  Other lack of coordination  Unsteadiness on feet  Other abnormalities of gait and mobility     Problem List Patient Active Problem List   Diagnosis Date Noted  . Mixed hyperlipidemia 09/28/2018  . Middle cerebral artery embolism, left 04/18/2018  . History of stroke 04/17/2018  . IFG (impaired fasting glucose) 03/28/2018  . Essential hypertension, benign 01/15/2017  . Asthma 01/15/2017    Lieutenant Diego PT, DPT 3:52 PM,02/22/19   [place] MAIN [facility] SERVICES [address] [place], [place], [address] Phone: [phone]   Fax:  [phone]  Name: STEPHAN DRAUGHN MRN: [id number] Date of Birth: January 07, 1956

## 2019-02-22 NOTE — Therapy (Signed)
[place] MAIN [facility] SERVICES [address] [place], [place], [address] Phone: [phone]   Fax:  [phone]  Occupational Therapy Treatment  Patient Details  Name: Kristin Solis MRN: [id number] Date of Birth: 12/19/1955 Referring Provider (OT): Marnee Guarneri   Encounter Date: 02/22/2019  OT End of Session - 02/22/19 6712    Visit Number  15    Number of Visits  37    Date for OT Re-Evaluation  04/28/18    OT Start Time  1420    OT Stop Time  1505    OT Time Calculation (min)  45 min       Past Medical History:  Diagnosis Date  . Asthma   . Hypertension   . Stroke [facility])     Past Surgical History:  Procedure Laterality Date  . ABDOMINAL HYSTERECTOMY  2005   total  . IR CT HEAD LTD  04/18/2018  . IR INTRAVSC STENT CERV CAROTID W/O EMB-PROT MOD SED INC ANGIO  04/18/2018  . IR PERCUTANEOUS ART THROMBECTOMY/INFUSION INTRACRANIAL INC DIAG ANGIO  04/18/2018  . OOPHORECTOMY    . RADIOLOGY WITH ANESTHESIA N/A 04/17/2018   Procedure: RADIOLOGY WITH ANESTHESIA;  Surgeon: Luanne Bras, MD;  Location: [place];  Service: Radiology;  Laterality: N/A;    There were no vitals filed for this visit.  Subjective Assessment - 02/22/19 1515    Subjective   Pt. eports doing well today.    Pertinent History  Patient is a 64 y.o. female with history of stroke,  admitted to ED on 04/17/18 with dysphagia and right sided weakness s/p left MCA CVA, extubated on 04/19/18 and discharged on 04/23/18 to private residence    Patient Stated Goals  Patient reports she would like to get her right hand to move more and be able to use for daily tasks.    Currently in Pain?  No/denies      OT TREATMENT    Neuro muscular re-education:  Pt. worked on grasping 1" resistive cubes alternating thumb opposition to the tip of the 2nd digit while the board is placed at a vertical angle. Pt. worked on pressing the cubes back into place while alternating isolated 2nd digit  extension.  Therapeutic Exercise:  Pt. tolerated PROM for right shoulder flexion, abduction, AAROM for horizontal abduction, AROM for elbow flexion, extension, AAROM with PROM to the end range for forearm supination, and AROM for wrist extension. Pt. Worked on active digit extension with holding at the tabletop. Pt. worked on gross gripping with the gripper stabilized in her hand at 6.6# for 2 set 10 reps.  Manual Therapy:  Pt. tolerated retrograde massage for edema control with the right forearm, and hand in elevation. Pt. tolerated soft tissue mobilizations for metacarpal spread stretches in the right hand to prepare the right hand for ROM.  Manual techniques were performed independent of, and prior toThere. Ex.  Response to Treatment:  Pt. Followed up inquiring about an extra small hand sleeve. An order for the sleeve will be placed. Pt. is making steady progress with her RUE , and hand function. Pt. is able to make a full composite fist, and was able to use both hands to tie her shoes, and writing cards. Pt. continues to present with limited active ROM in the Right shoulder, decreased grip strength, decreased pinch strength, and limited [facility] skills. Pt. is tolerating the treatment session well, and continues to work on these tasks in order to work towards improving RUE  engageement during tasks at home, and maximizing indepedence with ADLs, and IADLs.                     OT Education - 02/22/19 1516    Education Details  RUE functioning.    Person(s) Educated  Patient    Methods  Explanation;Demonstration    Comprehension  Verbalized understanding;Returned demonstration          OT Long Term Goals - 02/03/19 8182      OT LONG TERM GOAL #1   Title  Patient will complete home exercise program with modified independence.    Baseline  addition of new exercises ongoing as patient progresses with RUE.    Time  12    Period  Weeks    Status  On-going    Target Date   04/28/19      OT LONG TERM GOAL #2   Title  Patient will demonstrate understanding of Edema control measures for her right upper extremity and implement into daily routine.    Baseline  able to demo understanding and methods of edema control    Time  3    Period  Weeks    Status  Achieved      OT LONG TERM GOAL #3   Title  Patient will demonstrate improved grip by 5 # in right Hand to be able to hold items in hand with gross grasp.    Baseline  2# on right, unable to grasp items, UPdate on 02/02/19 patient has 6# and able to grasp select items depending on size and weight of item    Time  6    Period  Weeks    Status  On-going    Target Date  03/03/19      OT LONG TERM GOAL #4   Title  Patient will increase range of motion of fingers in right hand to be able to grasp built-up utensils for self feeding with modified independence.    Baseline  unable to feed self or hold utensils with right hand    Time  6    Period  Months    Status  Achieved      OT LONG TERM GOAL #5   Title  Patient will improve right hand function to be able to grasp pen to sign name on important papers with fair legibility.    Baseline  unable to hold pen or sign name, Update:  able to grasp large built up pen and starting to form strokes for handwriting    Time  12    Period  Weeks    Status  On-going    Target Date  04/28/19      Long Term Additional Goals   Additional Long Term Goals  Yes      OT LONG TERM GOAL #6   Title  Patient will hold a cup with modified independence.    Baseline  unable at eval with right hand, update on 02/02/19:  patient able to gross hold cup but has difficulty with maintaining grasp and depending on the weight and contents.    Time  12    Period  Weeks    Status  On-going    Target Date  04/28/19      OT LONG TERM GOAL #7   Title  Patient will improve right hand function to manipulate buttons, snaps, zippers with modified independence.    Baseline  difficulty at eval and  requires assist at  times.  Update:  can do some zippers but no buttons to date    Time  12    Period  Weeks    Status  On-going    Target Date  04/28/19      OT LONG TERM GOAL #8   Title  Patient will demonstrate the ability to stir items in a bowl of varying consistencies with modified independence.    Baseline  unable 02/02/2019    Time  12    Period  Weeks    Status  New    Target Date  04/28/19      OT LONG TERM GOAL  #9   TITLE  Patient will demonstrate the ability to move pots and pans from one location to another in the kitchen with modified independence.    Baseline  02/02/19 unable    Time  12    Period  Weeks    Status  New    Target Date  04/28/19      OT LONG TERM GOAL  #10   TITLE  Patient will improve right shoulder flexion by 10 degrees to assist with reaching and placing objects on table.    Baseline  02/02/2019:  right shoulder flexion 45 degrees    Time  12    Period  Weeks    Status  New    Target Date  04/28/19            Plan - 02/22/19 1517    Clinical Impression Statement  Pt. is making steady progress with her RUE , and hand function. Pt. is able to make a full composite fist, and was able to use both hands to tie her shoes, and writing cards. Pt. continues to present with limited active ROM in the Right shoulder, decreased grip strength, decreased pinch strength, and limited [facility] skills. Pt. is tolerating the treatment session well, and continues to work on these tasks in order to work towards improving RUE engageement during tasks at home, and maximizing indepedence with ADLs, and IADLs.    OT Occupational Profile and History  Detailed Assessment- Review of Records and additional review of physical, cognitive, psychosocial history related to current functional performance    Occupational performance deficits (Please refer to evaluation for details):  ADL's;IADL's;Work;Leisure    Body Structure / Function / Physical Skills  ADL;Coordination;GMC;UE  functional use;Balance;Decreased knowledge of use of DME;Flexibility;IADL;Dexterity;FMC;Proprioception;Strength;Edema;ROM    Cognitive Skills  Memory    Psychosocial Skills  Environmental  Adaptations;Habits;Routines and Behaviors    Rehab Potential  Good    Clinical Decision Making  Limited treatment options, no task modification necessary    Comorbidities Affecting Occupational Performance:  May have comorbidities impacting occupational performance    Modification or Assistance to Complete Evaluation   No modification of tasks or assist necessary to complete eval    OT Frequency  2x / week    OT Duration  12 weeks    OT Treatment/Interventions  Self-care/ADL training;Cryotherapy;Therapeutic exercise;DME and/or AE instruction;Functional Mobility Training;Cognitive remediation/compensation;Balance training;Electrical Stimulation;Neuromuscular education;Manual Therapy;Splinting;Moist Heat;Contrast Bath;Therapeutic activities;Patient/family education;Passive range of motion    Consulted and Agree with Plan of Care  Patient       Patient will benefit from skilled therapeutic intervention in order to improve the following deficits and impairments:   Body Structure / Function / Physical Skills: ADL, Coordination, GMC, UE functional use, Balance, Decreased knowledge of use of DME, Flexibility, IADL, Dexterity, FMC, Proprioception, Strength, Edema, ROM Cognitive Skills: Memory Psychosocial Skills: Environmental  Adaptations, Habits, Routines and Behaviors   Visit Diagnosis: Muscle weakness (generalized)  Other lack of coordination    Problem List Patient Active Problem List   Diagnosis Date Noted  . Mixed hyperlipidemia 09/28/2018  . Middle cerebral artery embolism, left 04/18/2018  . History of stroke 04/17/2018  . IFG (impaired fasting glucose) 03/28/2018  . Essential hypertension, benign 01/15/2017  . Asthma 01/15/2017    Olegario Messier, MS, OTR/L 02/22/2019, 3:23 PM  [facility] MAIN [facility] SERVICES [address] [place], [place], [address] Phone: [phone]   Fax:  [phone]  Name: Kristin Solis MRN: [id number] Date of Birth: 03-20-1955

## 2019-02-28 ENCOUNTER — Ambulatory Visit: Payer: Medicaid Other | Admitting: Occupational Therapy

## 2019-02-28 ENCOUNTER — Ambulatory Visit: Payer: Medicaid Other | Admitting: Physical Therapy

## 2019-02-28 ENCOUNTER — Encounter: Payer: Self-pay | Admitting: Physical Therapy

## 2019-02-28 ENCOUNTER — Other Ambulatory Visit: Payer: Self-pay

## 2019-02-28 DIAGNOSIS — R278 Other lack of coordination: Secondary | ICD-10-CM

## 2019-02-28 DIAGNOSIS — R600 Localized edema: Secondary | ICD-10-CM

## 2019-02-28 DIAGNOSIS — R2689 Other abnormalities of gait and mobility: Secondary | ICD-10-CM

## 2019-02-28 DIAGNOSIS — Z8673 Personal history of transient ischemic attack (TIA), and cerebral infarction without residual deficits: Secondary | ICD-10-CM | POA: Diagnosis not present

## 2019-02-28 DIAGNOSIS — M6281 Muscle weakness (generalized): Secondary | ICD-10-CM

## 2019-02-28 DIAGNOSIS — I69351 Hemiplegia and hemiparesis following cerebral infarction affecting right dominant side: Secondary | ICD-10-CM

## 2019-02-28 DIAGNOSIS — R2681 Unsteadiness on feet: Secondary | ICD-10-CM | POA: Diagnosis not present

## 2019-02-28 NOTE — Therapy (Signed)
[place] MAIN [facility] SERVICES [address] [place], [place], [address] Phone: [phone]   Fax:  [phone]  Physical Therapy Treatment  Patient Details  Name: Kristin Solis MRN: [id number] Date of Birth: 06-Oct-1955 Referring Provider (PT): [name]   Encounter Date: 02/28/2019  PT End of Session - 02/28/19 1553    Visit Number  2    Number of Visits  25    Date for PT Re-Evaluation  03/07/19    Authorization Type  eval 10/27    Authorization - Visit Number  1    Authorization - Number of Visits  12    PT Start Time  0345    PT Stop Time  0425    PT Time Calculation (min)  40 min    Equipment Utilized During Treatment  Gait belt    Activity Tolerance  Patient tolerated treatment well;No increased pain;Patient limited by fatigue    Behavior During Therapy  [facility] for tasks assessed/performed       Past Medical History:  Diagnosis Date  . Asthma   . Hypertension   . Stroke [facility])     Past Surgical History:  Procedure Laterality Date  . ABDOMINAL HYSTERECTOMY  2005   total  . IR CT HEAD LTD  04/18/2018  . IR INTRAVSC STENT CERV CAROTID W/O EMB-PROT MOD SED INC ANGIO  04/18/2018  . IR PERCUTANEOUS ART THROMBECTOMY/INFUSION INTRACRANIAL INC DIAG ANGIO  04/18/2018  . OOPHORECTOMY    . RADIOLOGY WITH ANESTHESIA N/A 04/17/2018   Procedure: RADIOLOGY WITH ANESTHESIA;  Surgeon: Luanne Bras, MD;  Location: [place];  Service: Radiology;  Laterality: N/A;    There were no vitals filed for this visit.  Subjective Assessment - 02/28/19 1552    Subjective  Patient reported compliance with HEP, stated she is doing well today, no changes to medications, falls, or stumbles    Patient is accompained by:  Family member    Pertinent History  Patient is a 64 y.o. female with history of stroke receiving care previously from [facility]. She was initially admitted to ED on 04/17/18 with dysphagia and right sided weakness s/p left  MCA CVA, extubated on 04/19/18 and discharged on 04/23/18 to private residence. Was independent with ADLs/IADLs prior to admission. Inpatient PT reported decreased safety awareness and awareness of deficits, with deficits in the areas of balance, ambulation ability, transfers. Was able to ambulate 125 feet in hospital with min guard without AD, and independent with bed mobility.    Limitations  Lifting;Standing;Walking;Writing;House hold activities    How long can you sit comfortably?  As long as desired    How long can you stand comfortably?  5 minutes    How long can you walk comfortably?  Able to grocery shop, requires a seated rest break for a couple of minutes.    Patient Stated Goals  Wants to be able to use right arm and hand, and to feel more confortable with going shopping with her husband.    Currently in Pain?  No/denies    Pain Score  0-No pain    Pain Onset  More than a month ago       Therapeutic exercise: Nu-step x 5 mins L 2  Supine: SLR x 10 BLE, 2.5 Hookling marching x 15 , 2.5 lbs Hooklying abd/ER x 30 , RTB Bridging x 15 SAQ x 15 BLE, 2.5 , 5 sec hold Hip abd/add x 10, BLE, 2.5 lbs Heel  slides x 15, BLE. 2.5 lbs  Sidelying: Hip abd x 15 , BLE  Standing hip extension with knee flexed x 15 BLE, mirror used for correct technique Pt educated throughout session about proper posture and technique with exercises. Improved exercise technique, movement at target joints, use of target muscles after min to mod verbal, visual, tactile cues.                          PT Education - 02/28/19 1552    Education Details  HEP    Person(s) Educated  Patient    Methods  Explanation;Demonstration    Comprehension  Verbalized understanding;Returned demonstration;Need further instruction       PT Short Term Goals - 01/18/19 1408      PT SHORT TERM GOAL #1   Title  Patient will be independent in initial home exercise program to improve strength/mobility for  better functional independence with ADLs.    Baseline  Not made yet., 12/28/18: given a HEP but still needs cues for proper technique    Time  6    Period  Weeks    Status  Partially Met    Target Date  01/24/19      PT SHORT TERM GOAL #2   Title  Patient (> 26 years old) will complete five times sit to stand test in < 15 seconds indicating an increased LE strength and improved balance for decreased falls risk.    Baseline  12/13/18: 17.52 sec; 12/28/18: 13.53 sec without HHA    Time  6    Period  Weeks    Status  Achieved    Target Date  01/24/19        PT Long Term Goals - 01/18/19 1408      PT LONG TERM GOAL #1   Title  Patient will increase 10 meter walk test to >1.81ms as to improve gait speed for better community ambulation and to reduce fall risk.    Baseline  12/13/18: 0.64 m/s with rollator, 0.72 m/s without AD; 12/28/18: 0.84 m/s without AD 11/25: 1.0 m/s without AD    Time  12    Period  Weeks    Status  Achieved      PT LONG TERM GOAL #2   Title  Patient will increase six minute walk test distance to >1000 for progression to community ambulator and improve gait ability.    Baseline  12/13/18: 638ft with rollator; 12/28/18: deferred 11/25: 723 ft without an AD, 01/18/19 640    Time  12    Period  Weeks    Status  Partially Met    Target Date  03/07/19      PT LONG TERM GOAL #3   Title  Patient will be require no assist with ascend/descend 3 steps using Least restrictive assistive device without LOB to improve ability to get in/out of home.    Baseline  Dependent on rail assist to reach front door. 12/28/18: uses rail, one step at a time 11/25: able to ascend/descend step over step with BUE support, able to perform step to pattern with SUE support12/2/20=able to perform step to pattern with SUE support12    Time  12    Period  Weeks    Status  Partially Met    Target Date  01/18/19      PT LONG TERM GOAL #4   Title  Patient will increase BLE gross strength to  4+/5 as  to improve functional strength for independent gait, increased standing tolerance and increased ADL ability.    Baseline  12/13/18: RLE hip weakness; 12/28/18: RLE hip extension: 2+/5, hip abduction/adduction 3-/5, knee grossly 4-/5 11/25: RLE hip extension 3-/5 add/abd 3/5 knee 4/512/2/20=RLE hip extension 3-/5 add/abd 3/5 knee 4/5    Time  12    Period  Weeks    Status  Partially Met      PT LONG TERM GOAL #5   Title  Patient will increase Functional Gait Assessment score to >20/30 as to reduce fall risk and improve dynamic gait safety with community ambulation.    Baseline  12/28/18: 13/24, 01/18/19 13/30    Time  12    Period  Weeks    Status  New    Target Date  03/06/18            Plan - 02/28/19 1554    Clinical Impression Statement  Patient instructed in intermediate strengthening with weights  and balance exercise.  Patient requires min Vcs for correct exercise technique including to improve LE control. . Patient demonstrates better quad control with SLS tasks with rail assist. Patient would benefit from additional skilled PT intervention to improve balance/gait safety and reduce fall risk.   Personal Factors and Comorbidities  Age;Comorbidity 2;Fitness;Time since onset of injury/illness/exacerbation    Comorbidities  HTN, Hx of stroke    Examination-Activity Limitations  Bathing;Bed Mobility;Bend;Carry;Dressing;Lift;Locomotion Level;Reach Overhead;Squat;Stairs;Stand;Transfers    Examination-Participation Restrictions  Cleaning;Community Activity;Driving;Laundry;Shop;Yard Work    Stability/Clinical Decision Making  Stable/Uncomplicated    Rehab Potential  Good    PT Frequency  2x / week    PT Duration  12 weeks    PT Treatment/Interventions  ADLs/Self Care Home Management;Aquatic Therapy;Cryotherapy;Electrical Stimulation;Moist Heat;DME Instruction;Gait training;Stair training;Functional mobility training;Therapeutic activities;Therapeutic exercise;Balance  training;Neuromuscular re-education;Patient/family education;Orthotic Fit/Training;Manual techniques;Compression bandaging;Passive range of motion;Dry needling;Energy conservation;Taping    PT Next Visit Plan  review HEP, balance    PT Home Exercise Plan  No updates this date    Consulted and Agree with Plan of Care  Patient       Patient will benefit from skilled therapeutic intervention in order to improve the following deficits and impairments:  Abnormal gait, Decreased balance, Decreased endurance, Decreased mobility, Difficulty walking, Hypomobility, Decreased range of motion, Improper body mechanics, Decreased activity tolerance, Decreased strength, Impaired UE functional use, Postural dysfunction  Visit Diagnosis: Muscle weakness (generalized)  Other lack of coordination  History of stroke  Hemiplegia and hemiparesis following cerebral infarction affecting right dominant side (HCC)  Unsteadiness on feet  Other abnormalities of gait and mobility     Problem List Patient Active Problem List   Diagnosis Date Noted  . Mixed hyperlipidemia 09/28/2018  . Middle cerebral artery embolism, left 04/18/2018  . History of stroke 04/17/2018  . IFG (impaired fasting glucose) 03/28/2018  . Essential hypertension, benign 01/15/2017  . Asthma 01/15/2017    Alanson Puls, PT DPT 02/28/2019, 3:55 PM  [place] MAIN [facility] SERVICES [address] [place], [place], [address] Phone: [phone]   Fax:  [phone]  Name: Kristin Solis MRN: [id number] Date of Birth: [birth year]

## 2019-03-01 ENCOUNTER — Encounter: Payer: Self-pay | Admitting: Occupational Therapy

## 2019-03-01 NOTE — Therapy (Signed)
[facility] MAIN [facility] SERVICES [address] [place], [place], [address] Phone: [phone]   Fax:  [phone]  Occupational Therapy Treatment  Patient Details  Name: Kristin Solis MRN: [id number] Date of Birth: September 14, 1955 Referring Provider (OT): Aura Dials   Encounter Date: 02/28/2019  OT End of Session - 03/01/19 1443    Visit Number  16    Number of Visits  37    Date for OT Re-Evaluation  04/28/18    OT Start Time  1500    OT Stop Time  1545    OT Time Calculation (min)  45 min    Activity Tolerance  Patient tolerated treatment well    Behavior During Therapy  [facility] for tasks assessed/performed       Past Medical History:  Diagnosis Date  . Asthma   . Hypertension   . Stroke [facility])     Past Surgical History:  Procedure Laterality Date  . ABDOMINAL HYSTERECTOMY  2005   total  . IR CT HEAD LTD  04/18/2018  . IR INTRAVSC STENT CERV CAROTID W/O EMB-PROT MOD SED INC ANGIO  04/18/2018  . IR PERCUTANEOUS ART THROMBECTOMY/INFUSION INTRACRANIAL INC DIAG ANGIO  04/18/2018  . OOPHORECTOMY    . RADIOLOGY WITH ANESTHESIA N/A 04/17/2018   Procedure: RADIOLOGY WITH ANESTHESIA;  Surgeon: Julieanne Cotton, MD;  Location: [facility] OR;  Service: Radiology;  Laterality: N/A;    There were no vitals filed for this visit.  Subjective Assessment - 03/01/19 1441    Subjective   Patient emotional today when doing her measurements, "tears of joy and happiness".  She is pleased with her progress so far and is working on exercises at home on a daily basis.    Pertinent History  Patient is a 64 y.o. female with history of stroke,  admitted to ED on 04/17/18 with dysphagia and right sided weakness s/p left MCA CVA, extubated on 04/19/18 and discharged on 04/23/18 to private residence    Patient Stated Goals  Patient reports she would like to get her right hand to move more and be able to use for daily tasks.    Currently in Pain?  No/denies    Pain Score  0-No pain     Multiple Pain Sites  No         [facility] OT Assessment - 03/01/19 0001      Coordination   Right 9 Hole Peg Test  2 min 1 sec      Hand Function   Right Hand Grip (lbs)  10    Right Hand Lateral Pinch  5 lbs    Right Hand 3 Point Pinch  5 lbs    Comment  2 point right 2#       Therapeutic Exercise:  Patient seen for PROM of right hand followed by AROM with place and hold into flexion, fisting for 5 sec and then full extension of digits.   Measurements taken to determine progress as noted in above chart.  Patient grip improved from 2# at eval to 10# this date.  Pinch strength has improved consistently.  Patient was unable to perform 9 hole peg test at eval, could not pick up or place pegs.  Today she was able to complete in 2 mins 1 sec.   Circumferential measurements of right hand for edema:   6.8 thumb (down from 7.2) 6.8 index (down from 7.5) 18.0 at MPs ( down from 19.2) 19.0 dorsum of the hand ( down  from 20.5) 17.7 wrist (down from 18.8) 25.5 forearm 10 cm (same)  Patient issued new edema control glove in size XS to further reduce edema.   Patient does still have some tenderness and pain at times in her right shoulder and remains limited in ROM and reaching.  Will plan to further address and work on next session.    Response to tx:   Patient continues to make excellent progress, she has seen reductions in edema with the implementation of isotoner compression glove, wearing at night and using hand actively during the day.  She is now feeding herself with her right hand without using built up handles, she is performing some handwriting with fair legibility, she is able to achieve full composite fisting and grip strength improved from 2# to 10#.  She was unable to perform 9 hole peg test at eval and now able to complete in 2 min 1 sec.  She is pleased with her progress and is consistent with performing exercises on a daily basis at home.  Therapist continually changing home  exercise program as patient progresses.  New compression glove this date to further reduce edema which improves motion and decreases pain.  Will plan to work on right shoulder next visit to address ROM limitations and pain.  Patient continues to benefit from skilled OT services to maximize safety and independence in daily tasks.  She has shown excellent progress since starting therapy especially since she had no therapy for 6-7 months post stroke.                 OT Education - 03/01/19 1443    Education Details  new isotoner glove in XS, continued ROM and exercises for HEP    Person(s) Educated  Patient    Methods  Explanation;Demonstration    Comprehension  Verbalized understanding;Returned demonstration          OT Long Term Goals - 03/01/19 2022      OT LONG TERM GOAL #1   Title  Patient will complete home exercise program with modified independence.    Baseline  addition of new exercises ongoing as patient progresses with RUE.    Time  12    Period  Weeks    Status  On-going    Target Date  04/28/19      OT LONG TERM GOAL #2   Title  Patient will demonstrate understanding of Edema control measures for her right upper extremity and implement into daily routine.    Baseline  able to demo understanding and methods of edema control    Time  3    Period  Weeks    Status  Achieved      OT LONG TERM GOAL #3   Title  Patient will demonstrate improved grip by 10 # in right Hand to be able to hold items in hand with gross grasp.    Baseline  2# at eval, 10# on 03/01/2019    Time  6    Period  Weeks    Status  Revised    Target Date  04/28/19      OT LONG TERM GOAL #4   Title  Patient will increase range of motion of fingers in right hand to be able to grasp built-up utensils for self feeding with modified independence.    Baseline  unable to feed self or hold utensils with right hand    Time  6    Period  Months    Status  Achieved      OT LONG TERM GOAL #5    Title  Patient will improve right hand function to be able to grasp pen to sign name on important papers with good legibility.    Baseline  unable to hold pen or sign name, Update:  able to grasp large built up pen and starting to form strokes for handwriting.  Achieved and revised to good legiblity.    Time  12    Period  Weeks    Status  On-going    Target Date  04/28/19      OT LONG TERM GOAL #6   Title  Patient will hold a cup with modified independence.    Baseline  unable at eval with right hand, update on 02/02/19:  patient able to gross hold cup but has difficulty with maintaining grasp and depending on the weight and contents.    Time  12    Period  Weeks    Status  Achieved      OT LONG TERM GOAL #7   Title  Patient will improve right hand function to manipulate buttons, snaps, zippers with modified independence.    Baseline  difficulty at eval and requires assist at times.  Update:  can do some zippers but no buttons to date    Time  12    Period  Weeks    Status  On-going    Target Date  04/28/19      OT LONG TERM GOAL #8   Title  Patient will demonstrate the ability to stir items in a bowl of varying consistencies with modified independence.    Baseline  unable 02/02/2019    Time  12    Period  Weeks    Status  On-going    Target Date  04/28/19      OT LONG TERM GOAL  #9   TITLE  Patient will demonstrate the ability to move pots and pans from one location to another in the kitchen with modified independence.    Baseline  02/02/19 unable    Time  12    Period  Weeks    Status  On-going    Target Date  04/28/19      OT LONG TERM GOAL  #10   TITLE  Patient will improve right shoulder flexion by 10 degrees to assist with reaching and placing objects on table.    Baseline  02/02/2019:  right shoulder flexion 45 degrees, 03/01/2019 60 degrees    Time  12    Period  Weeks    Status  On-going    Target Date  04/28/19            Plan - 03/01/19 1444     Clinical Impression Statement  Patient continues to make excellent progress, she has seen reductions in edema with the implementation of isotoner compression glove, wearing at night and using hand actively during the day.  She is now feeding herself with her right hand without using built up handles, she is performing some handwriting with fair legibility, she is able to achieve full composite fisting and grip strength improved from 2# to 10#.  She was unable to perform 9 hole peg test at eval and now able to complete in 2 min 1 sec.  She is pleased with her progress and is consistent with performing exercises on a daily basis at home.  Therapist continually changing home exercise program as patient progresses.  New compression glove this  date to further reduce edema which improves motion and decreases pain.  Will plan to work on right shoulder next visit to address ROM limitations and pain.  Patient continues to benefit from skilled OT services to maximize safety and independence in daily tasks.  She has shown excellent progress since starting therapy especially since she had no therapy for 6-7 months post stroke.    OT Occupational Profile and History  Detailed Assessment- Review of Records and additional review of physical, cognitive, psychosocial history related to current functional performance    Occupational performance deficits (Please refer to evaluation for details):  ADL's;IADL's;Work;Leisure    Body Structure / Function / Physical Skills  ADL;Coordination;GMC;UE functional use;Balance;Decreased knowledge of use of DME;Flexibility;IADL;Dexterity;FMC;Proprioception;Strength;Edema;ROM    Cognitive Skills  Memory    Psychosocial Skills  Environmental  Adaptations;Habits;Routines and Behaviors    Rehab Potential  Good    Clinical Decision Making  Limited treatment options, no task modification necessary    Comorbidities Affecting Occupational Performance:  May have comorbidities impacting occupational  performance    Modification or Assistance to Complete Evaluation   No modification of tasks or assist necessary to complete eval    OT Duration  12 weeks    OT Treatment/Interventions  Self-care/ADL training;Cryotherapy;Therapeutic exercise;DME and/or AE instruction;Functional Mobility Training;Cognitive remediation/compensation;Balance training;Electrical Stimulation;Neuromuscular education;Manual Therapy;Splinting;Moist Heat;Contrast Bath;Therapeutic activities;Patient/family education;Passive range of motion    Consulted and Agree with Plan of Care  Patient       Patient will benefit from skilled therapeutic intervention in order to improve the following deficits and impairments:   Body Structure / Function / Physical Skills: ADL, Coordination, GMC, UE functional use, Balance, Decreased knowledge of use of DME, Flexibility, IADL, Dexterity, FMC, Proprioception, Strength, Edema, ROM Cognitive Skills: Memory Psychosocial Skills: Environmental  Adaptations, Habits, Routines and Behaviors   Visit Diagnosis: Muscle weakness (generalized)  Other lack of coordination  Hemiplegia and hemiparesis following cerebral infarction affecting right dominant side (HCC)  Unsteadiness on feet  Localized edema    Problem List Patient Active Problem List   Diagnosis Date Noted  . Mixed hyperlipidemia 09/28/2018  . Middle cerebral artery embolism, left 04/18/2018  . History of stroke 04/17/2018  . IFG (impaired fasting glucose) 03/28/2018  . Essential hypertension, benign 01/15/2017  . Asthma 01/15/2017   Amaryah Mallen T Arne Cleveland, OTR/L, CLT  Javante Nilsson 03/01/2019, 8:26 PM  [place] MAIN [facility] SERVICES [address] [place], [place], [address] Phone: [phone]   Fax:  [phone]  Name: MIRRANDA MONRROY MRN: [id number] Date of Birth: Apr 08, 1955

## 2019-03-02 ENCOUNTER — Ambulatory Visit: Payer: Medicaid Other | Admitting: Occupational Therapy

## 2019-03-02 ENCOUNTER — Ambulatory Visit: Payer: Medicaid Other | Admitting: Physical Therapy

## 2019-03-02 ENCOUNTER — Encounter: Payer: Self-pay | Admitting: Occupational Therapy

## 2019-03-02 ENCOUNTER — Other Ambulatory Visit: Payer: Self-pay

## 2019-03-02 DIAGNOSIS — Z8673 Personal history of transient ischemic attack (TIA), and cerebral infarction without residual deficits: Secondary | ICD-10-CM | POA: Diagnosis not present

## 2019-03-02 DIAGNOSIS — R2689 Other abnormalities of gait and mobility: Secondary | ICD-10-CM | POA: Diagnosis not present

## 2019-03-02 DIAGNOSIS — R2681 Unsteadiness on feet: Secondary | ICD-10-CM | POA: Diagnosis not present

## 2019-03-02 DIAGNOSIS — M6281 Muscle weakness (generalized): Secondary | ICD-10-CM

## 2019-03-02 DIAGNOSIS — R278 Other lack of coordination: Secondary | ICD-10-CM | POA: Diagnosis not present

## 2019-03-02 DIAGNOSIS — I69351 Hemiplegia and hemiparesis following cerebral infarction affecting right dominant side: Secondary | ICD-10-CM | POA: Diagnosis not present

## 2019-03-02 DIAGNOSIS — R600 Localized edema: Secondary | ICD-10-CM

## 2019-03-02 NOTE — Therapy (Signed)
[place] MAIN [facility] SERVICES [address] [place], [place], [address] Phone: [phone]   Fax:  [phone]  Physical Therapy Treatment  Patient Details  Name: Kristin Solis MRN: [id number] Date of Birth: 05-Nov-1955 Referring Provider (PT): [name]   Encounter Date: 03/02/2019  PT End of Session - 03/02/19 1635    Visit Number  3    Number of Visits  25    Date for PT Re-Evaluation  03/07/19    Authorization Type  eval 10/27    Authorization - Visit Number  3    Authorization - Number of Visits  12    PT Start Time  0420    PT Stop Time  0500    PT Time Calculation (min)  40 min    Equipment Utilized During Treatment  Gait belt    Activity Tolerance  Patient tolerated treatment well;No increased pain;Patient limited by fatigue    Behavior During Therapy  [facility] for tasks assessed/performed       Past Medical History:  Diagnosis Date  . Asthma   . Hypertension   . Stroke [facility])     Past Surgical History:  Procedure Laterality Date  . ABDOMINAL HYSTERECTOMY  2005   total  . IR CT HEAD LTD  04/18/2018  . IR INTRAVSC STENT CERV CAROTID W/O EMB-PROT MOD SED INC ANGIO  04/18/2018  . IR PERCUTANEOUS ART THROMBECTOMY/INFUSION INTRACRANIAL INC DIAG ANGIO  04/18/2018  . OOPHORECTOMY    . RADIOLOGY WITH ANESTHESIA N/A 04/17/2018   Procedure: RADIOLOGY WITH ANESTHESIA;  Surgeon: Luanne Bras, MD;  Location: [place];  Service: Radiology;  Laterality: N/A;    There were no vitals filed for this visit.  Subjective Assessment - 03/02/19 1635    Subjective  Patient reported compliance with HEP, stated she is doing well today, no changes to medications, falls, or stumbles    Currently in Pain?  No/denies    Pain Score  0-No pain       Treatment:   Neuromuscular Re-education  Foam and 6 inch step and trunk rotation side to side x 20 each direction Step ups from foam to 1 step x 20  Heel/toe raises without UE support 3s hold x 10  each 1/2 foam roll balance with flat side up 30s x 2 reps 1/2 foam roll balance with flat side down 30s x 2 reps 1/2 foam roll tandem balance alternating forward LE 30s x 2 each LE forwar Lateral hurdle stepping left and right, fwd/ bwd  x 10       Pt educated throughout session about proper posture and technique with exercises. Improved exercise technique, movement at target joints, use of target muscles after min to mod verbal, visual, tactile cues. CGA and Min to mod verbal cues used throughout with increased in postural sway and LOB most seen with narrow base of support and while on uneven surfaces. Continues to have balance deficits typical with diagnosis. Patient performs intermediate level exercises without pain behaviors and needs verbal cuing for postural alignment and head positioning                      PT Education - 03/02/19 1635    Education Details  hep    Person(s) Educated  Patient    Methods  Explanation;Demonstration;Tactile cues    Comprehension  Verbalized understanding;Returned demonstration;Verbal cues required;Need further instruction       PT Short Term Goals - 01/18/19 1408  PT SHORT TERM GOAL #1   Title  Patient will be independent in initial home exercise program to improve strength/mobility for better functional independence with ADLs.    Baseline  Not made yet., 12/28/18: given a HEP but still needs cues for proper technique    Time  6    Period  Weeks    Status  Partially Met    Target Date  01/24/19      PT SHORT TERM GOAL #2   Title  Patient (> 40 years old) will complete five times sit to stand test in < 15 seconds indicating an increased LE strength and improved balance for decreased falls risk.    Baseline  12/13/18: 17.52 sec; 12/28/18: 13.53 sec without HHA    Time  6    Period  Weeks    Status  Achieved    Target Date  01/24/19        PT Long Term Goals - 01/18/19 1408      PT LONG TERM GOAL #1   Title  Patient  will increase 10 meter walk test to >1.78ms as to improve gait speed for better community ambulation and to reduce fall risk.    Baseline  12/13/18: 0.64 m/s with rollator, 0.72 m/s without AD; 12/28/18: 0.84 m/s without AD 11/25: 1.0 m/s without AD    Time  12    Period  Weeks    Status  Achieved      PT LONG TERM GOAL #2   Title  Patient will increase six minute walk test distance to >1000 for progression to community ambulator and improve gait ability.    Baseline  12/13/18: 644ft with rollator; 12/28/18: deferred 11/25: 723 ft without an AD, 01/18/19 640    Time  12    Period  Weeks    Status  Partially Met    Target Date  03/07/19      PT LONG TERM GOAL #3   Title  Patient will be require no assist with ascend/descend 3 steps using Least restrictive assistive device without LOB to improve ability to get in/out of home.    Baseline  Dependent on rail assist to reach front door. 12/28/18: uses rail, one step at a time 11/25: able to ascend/descend step over step with BUE support, able to perform step to pattern with SUE support12/2/20=able to perform step to pattern with SUE support12    Time  12    Period  Weeks    Status  Partially Met    Target Date  01/18/19      PT LONG TERM GOAL #4   Title  Patient will increase BLE gross strength to 4+/5 as to improve functional strength for independent gait, increased standing tolerance and increased ADL ability.    Baseline  12/13/18: RLE hip weakness; 12/28/18: RLE hip extension: 2+/5, hip abduction/adduction 3-/5, knee grossly 4-/5 11/25: RLE hip extension 3-/5 add/abd 3/5 knee 4/512/2/20=RLE hip extension 3-/5 add/abd 3/5 knee 4/5    Time  12    Period  Weeks    Status  Partially Met      PT LONG TERM GOAL #5   Title  Patient will increase Functional Gait Assessment score to >20/30 as to reduce fall risk and improve dynamic gait safety with community ambulation.    Baseline  12/28/18: 13/24, 01/18/19 13/30    Time  12    Period  Weeks     Status  New    Target Date  03/06/18            Plan - 03/02/19 1636    Clinical Impression Statement  Pt presents with unsteadiness on uneven surfaces and fatigues with therapeutic exercises.  Patient tolerated all interventions well this date and will benefit from continued skilled PT interventions to improve strength and balance and decrease risk of falling.   Personal Factors and Comorbidities  Age;Comorbidity 2;Fitness;Time since onset of injury/illness/exacerbation    Comorbidities  HTN, Hx of stroke    Examination-Activity Limitations  Bathing;Bed Mobility;Bend;Carry;Dressing;Lift;Locomotion Level;Reach Overhead;Squat;Stairs;Stand;Transfers    Examination-Participation Restrictions  Cleaning;Community Activity;Driving;Laundry;Shop;Yard Work    Stability/Clinical Decision Making  Stable/Uncomplicated    Rehab Potential  Good    PT Frequency  2x / week    PT Duration  12 weeks    PT Treatment/Interventions  ADLs/Self Care Home Management;Aquatic Therapy;Cryotherapy;Electrical Stimulation;Moist Heat;DME Instruction;Gait training;Stair training;Functional mobility training;Therapeutic activities;Therapeutic exercise;Balance training;Neuromuscular re-education;Patient/family education;Orthotic Fit/Training;Manual techniques;Compression bandaging;Passive range of motion;Dry needling;Energy conservation;Taping    PT Next Visit Plan  review HEP, balance    PT Home Exercise Plan  No updates this date    Consulted and Agree with Plan of Care  Patient       Patient will benefit from skilled therapeutic intervention in order to improve the following deficits and impairments:  Abnormal gait, Decreased balance, Decreased endurance, Decreased mobility, Difficulty walking, Hypomobility, Decreased range of motion, Improper body mechanics, Decreased activity tolerance, Decreased strength, Impaired UE functional use, Postural dysfunction  Visit Diagnosis: Muscle weakness (generalized)  Other  lack of coordination  History of stroke  Hemiplegia and hemiparesis following cerebral infarction affecting right dominant side (HCC)  Unsteadiness on feet  Other abnormalities of gait and mobility  Localized edema     Problem List Patient Active Problem List   Diagnosis Date Noted  . Mixed hyperlipidemia 09/28/2018  . Middle cerebral artery embolism, left 04/18/2018  . History of stroke 04/17/2018  . IFG (impaired fasting glucose) 03/28/2018  . Essential hypertension, benign 01/15/2017  . Asthma 01/15/2017    Alanson Puls, PT DPT 03/02/2019, 4:37 PM  [place] MAIN [facility] SERVICES [address] [place], [place], [address] Phone: [phone]   Fax:  [phone]  Name: Kristin Solis MRN: [id number] Date of Birth: 08-03-1955

## 2019-03-02 NOTE — Therapy (Signed)
[facility] MAIN [facility] SERVICES [address] [place], [place], [address] Phone: [phone]   Fax:  [phone]  Occupational Therapy Treatment  Patient Details  Name: Kristin Solis MRN: [id number] Date of Birth: 1955-12-17 Referring Provider (OT): Aura Dials   Encounter Date: 03/02/2019  OT End of Session - 03/02/19 1649    Visit Number  17    Number of Visits  37    Date for OT Re-Evaluation  04/28/18    OT Start Time  1435    OT Stop Time  1520    OT Time Calculation (min)  45 min    Activity Tolerance  Patient tolerated treatment well    Behavior During Therapy  [facility] for tasks assessed/performed       Past Medical History:  Diagnosis Date  . Asthma   . Hypertension   . Stroke [facility])     Past Surgical History:  Procedure Laterality Date  . ABDOMINAL HYSTERECTOMY  2005   total  . IR CT HEAD LTD  04/18/2018  . IR INTRAVSC STENT CERV CAROTID W/O EMB-PROT MOD SED INC ANGIO  04/18/2018  . IR PERCUTANEOUS ART THROMBECTOMY/INFUSION INTRACRANIAL INC DIAG ANGIO  04/18/2018  . OOPHORECTOMY    . RADIOLOGY WITH ANESTHESIA N/A 04/17/2018   Procedure: RADIOLOGY WITH ANESTHESIA;  Surgeon: Julieanne Cotton, MD;  Location: [facility] OR;  Service: Radiology;  Laterality: N/A;    There were no vitals filed for this visit.  Subjective Assessment - 03/02/19 1648    Subjective   Pt. continues to be happy with her progress.    Pertinent History  Patient is a 64 y.o. female with history of stroke,  admitted to ED on 04/17/18 with dysphagia and right sided weakness s/p left MCA CVA, extubated on 04/19/18 and discharged on 04/23/18 to private residence    Patient Stated Goals  Patient reports she would like to get her right hand to move more and be able to use for daily tasks.    Currently in Pain?  No/denies       OT TREATMENT    Neuro muscular re-education:  Pt. worked on grasping 1" resistive cubes alternating thumb opposition to the tip of the 2nd  digit while the board is placed at a vertical angle. Pt. worked on pressing the cubes back into place while alternating isolated 2nd digit extension.  Therapeutic Exercise:  Pt. tolerated PROM for right shoulder flexion, abduction. AROM for elbow flexion, extension, AAROM with PROM to the end range for forearm supination, and AROM for wrist extension. Moist heat was applied to the  Right shoulder for 10 min. Prior to ROM during manual techniques with the right hand.  Manual Therapy:  Pt. tolerated retrograde massage for edema control with the right forearm, and hand in elevation. Manual techniques were performed independent of, and prior toThere. Ex. Manual techniques were performed following contrast hot, and cold reps Alternating heat 3 min, cold 1 min. 3 reps. Pt. Tolerated well.   Response to Treatment  Pt. continues to make progress, and presents with more reduction in edema in the right hand since upgrading to the extra small glove. Pt. Is using her right hand more during ADL, and IADL tasks at home. Pt. reports that she has improved with fastening her bra, and has improved with writing skills. Pt. continues to present with limited RUE strength, ROM, and [facility] skills. Pt. continues to work on improving LUE functioning in order to be able to increase active  engagement in ADLs, and IADL tasks.                        OT Education - 03/02/19 1649    Education Details  RUE functioning, hand exercises    Person(s) Educated  Patient    Methods  Explanation;Demonstration    Comprehension  Verbalized understanding;Returned demonstration          OT Long Term Goals - 03/01/19 2022      OT LONG TERM GOAL #1   Title  Patient will complete home exercise program with modified independence.    Baseline  addition of new exercises ongoing as patient progresses with RUE.    Time  12    Period  Weeks    Status  On-going    Target Date  04/28/19      OT LONG TERM GOAL #2    Title  Patient will demonstrate understanding of Edema control measures for her right upper extremity and implement into daily routine.    Baseline  able to demo understanding and methods of edema control    Time  3    Period  Weeks    Status  Achieved      OT LONG TERM GOAL #3   Title  Patient will demonstrate improved grip by 10 # in right Hand to be able to hold items in hand with gross grasp.    Baseline  2# at eval, 10# on 03/01/2019    Time  6    Period  Weeks    Status  Revised    Target Date  04/28/19      OT LONG TERM GOAL #4   Title  Patient will increase range of motion of fingers in right hand to be able to grasp built-up utensils for self feeding with modified independence.    Baseline  unable to feed self or hold utensils with right hand    Time  6    Period  Months    Status  Achieved      OT LONG TERM GOAL #5   Title  Patient will improve right hand function to be able to grasp pen to sign name on important papers with good legibility.    Baseline  unable to hold pen or sign name, Update:  able to grasp large built up pen and starting to form strokes for handwriting.  Achieved and revised to good legiblity.    Time  12    Period  Weeks    Status  On-going    Target Date  04/28/19      OT LONG TERM GOAL #6   Title  Patient will hold a cup with modified independence.    Baseline  unable at eval with right hand, update on 02/02/19:  patient able to gross hold cup but has difficulty with maintaining grasp and depending on the weight and contents.    Time  12    Period  Weeks    Status  Achieved      OT LONG TERM GOAL #7   Title  Patient will improve right hand function to manipulate buttons, snaps, zippers with modified independence.    Baseline  difficulty at eval and requires assist at times.  Update:  can do some zippers but no buttons to date    Time  12    Period  Weeks    Status  On-going    Target Date  04/28/19  OT LONG TERM GOAL #8   Title   Patient will demonstrate the ability to stir items in a bowl of varying consistencies with modified independence.    Baseline  unable 02/02/2019    Time  12    Period  Weeks    Status  On-going    Target Date  04/28/19      OT LONG TERM GOAL  #9   TITLE  Patient will demonstrate the ability to move pots and pans from one location to another in the kitchen with modified independence.    Baseline  02/02/19 unable    Time  12    Period  Weeks    Status  On-going    Target Date  04/28/19      OT LONG TERM GOAL  #10   TITLE  Patient will improve right shoulder flexion by 10 degrees to assist with reaching and placing objects on table.    Baseline  02/02/2019:  right shoulder flexion 45 degrees, 03/01/2019 60 degrees    Time  12    Period  Weeks    Status  On-going    Target Date  04/28/19            Plan - 03/02/19 1650    Clinical Impression Statement  Pt. continues to make progress, and presents with more reduction in edema in the right hand since upgrading to the extra small glove. Pt. reports that she has improved with fastening her bra, and has improved with writing skills. Pt. continues to present with limited RUE strength, ROM, and [facility] skills. Pt. continues to work on improving LUE functioning in order to be able to increase active engagement in ADLs, and IADL tasks.    OT Occupational Profile and History  Detailed Assessment- Review of Records and additional review of physical, cognitive, psychosocial history related to current functional performance    Occupational performance deficits (Please refer to evaluation for details):  ADL's;IADL's;Work;Leisure    Body Structure / Function / Physical Skills  ADL;Coordination;GMC;UE functional use;Balance;Decreased knowledge of use of DME;Flexibility;IADL;Dexterity;FMC;Proprioception;Strength;Edema;ROM    Cognitive Skills  Memory    Psychosocial Skills  Environmental  Adaptations;Habits;Routines and Behaviors    Rehab Potential  Good     Clinical Decision Making  Limited treatment options, no task modification necessary    Comorbidities Affecting Occupational Performance:  May have comorbidities impacting occupational performance    Modification or Assistance to Complete Evaluation   No modification of tasks or assist necessary to complete eval    OT Frequency  2x / week    OT Duration  12 weeks    OT Treatment/Interventions  Self-care/ADL training;Cryotherapy;Therapeutic exercise;DME and/or AE instruction;Functional Mobility Training;Cognitive remediation/compensation;Balance training;Electrical Stimulation;Neuromuscular education;Manual Therapy;Splinting;Moist Heat;Contrast Bath;Therapeutic activities;Patient/family education;Passive range of motion    Consulted and Agree with Plan of Care  Patient       Patient will benefit from skilled therapeutic intervention in order to improve the following deficits and impairments:   Body Structure / Function / Physical Skills: ADL, Coordination, GMC, UE functional use, Balance, Decreased knowledge of use of DME, Flexibility, IADL, Dexterity, FMC, Proprioception, Strength, Edema, ROM Cognitive Skills: Memory Psychosocial Skills: Environmental  Adaptations, Habits, Routines and Behaviors   Visit Diagnosis: Muscle weakness (generalized)  Other lack of coordination    Problem List Patient Active Problem List   Diagnosis Date Noted  . Mixed hyperlipidemia 09/28/2018  . Middle cerebral artery embolism, left 04/18/2018  . History of stroke 04/17/2018  . IFG (impaired fasting glucose)  03/28/2018  . Essential hypertension, benign 01/15/2017  . Asthma 01/15/2017    Olegario Messier, MS, OTR/L 03/02/2019, 5:00 PM  [facility] MAIN [facility] SERVICES [address] [place], [place], [address] Phone: [phone]   Fax:  [phone]  Name: RAEANNE DESCHLER MRN: [id number] Date of Birth: [birth year]

## 2019-03-06 ENCOUNTER — Ambulatory Visit: Payer: Medicaid Other | Admitting: Occupational Therapy

## 2019-03-06 ENCOUNTER — Encounter: Payer: Self-pay | Admitting: Physical Therapy

## 2019-03-06 ENCOUNTER — Ambulatory Visit: Payer: Medicaid Other | Admitting: Physical Therapy

## 2019-03-06 ENCOUNTER — Other Ambulatory Visit: Payer: Self-pay

## 2019-03-06 DIAGNOSIS — M6281 Muscle weakness (generalized): Secondary | ICD-10-CM | POA: Diagnosis not present

## 2019-03-06 DIAGNOSIS — I69351 Hemiplegia and hemiparesis following cerebral infarction affecting right dominant side: Secondary | ICD-10-CM | POA: Diagnosis not present

## 2019-03-06 DIAGNOSIS — Z8673 Personal history of transient ischemic attack (TIA), and cerebral infarction without residual deficits: Secondary | ICD-10-CM

## 2019-03-06 DIAGNOSIS — R278 Other lack of coordination: Secondary | ICD-10-CM

## 2019-03-06 DIAGNOSIS — R600 Localized edema: Secondary | ICD-10-CM

## 2019-03-06 DIAGNOSIS — R2689 Other abnormalities of gait and mobility: Secondary | ICD-10-CM | POA: Diagnosis not present

## 2019-03-06 DIAGNOSIS — R2681 Unsteadiness on feet: Secondary | ICD-10-CM | POA: Diagnosis not present

## 2019-03-06 NOTE — Therapy (Signed)
[place] MAIN [facility] SERVICES [address] [place], [place], [address] Phone: [phone]   Fax:  [phone]  Physical Therapy Treatment  Patient Details  Name: Kristin Solis MRN: [id number] Date of Birth: 11/28/1955 Referring Provider (PT): [name]   Encounter Date: 03/06/2019  PT End of Session - 03/06/19 1522    Visit Number  4    Number of Visits  25    Date for PT Re-Evaluation  03/07/19    Authorization Type  eval [id number]    Authorization - Visit Number  3    Authorization - Number of Visits  12    PT Start Time  0320    PT Stop Time  0400    PT Time Calculation (min)  40 min    Equipment Utilized During Treatment  Gait belt    Activity Tolerance  Patient tolerated treatment well;No increased pain;Patient limited by fatigue    Behavior During Therapy  [facility] for tasks assessed/performed       Past Medical History:  Diagnosis Date  . Asthma   . Hypertension   . Stroke [facility])     Past Surgical History:  Procedure Laterality Date  . ABDOMINAL HYSTERECTOMY  2005   total  . IR CT HEAD LTD  04/18/2018  . IR INTRAVSC STENT CERV CAROTID W/O EMB-PROT MOD SED INC ANGIO  04/18/2018  . IR PERCUTANEOUS ART THROMBECTOMY/INFUSION INTRACRANIAL INC DIAG ANGIO  04/18/2018  . OOPHORECTOMY    . RADIOLOGY WITH ANESTHESIA N/A 04/17/2018   Procedure: RADIOLOGY WITH ANESTHESIA;  Surgeon: Luanne Bras, MD;  Location: [place];  Service: Radiology;  Laterality: N/A;    There were no vitals filed for this visit.  Subjective Assessment - 03/06/19 1521    Subjective  Patient reported compliance with HEP, stated she is hurting on her R foot today, no changes to medications, falls, or stumbles    Patient is accompained by:  Family member    Pertinent History  Patient is a 64 y.o. female with history of stroke receiving care previously from [facility]. She was initially admitted to ED on 04/17/18 with dysphagia and right sided  weakness s/p left MCA CVA, extubated on 04/19/18 and discharged on 04/23/18 to private residence. Was independent with ADLs/IADLs prior to admission. Inpatient PT reported decreased safety awareness and awareness of deficits, with deficits in the areas of balance, ambulation ability, transfers. Was able to ambulate 125 feet in hospital with min guard without AD, and independent with bed mobility.    Limitations  Lifting;Standing;Walking;Writing;House hold activities    How long can you sit comfortably?  As long as desired    How long can you stand comfortably?  5 minutes    How long can you walk comfortably?  Able to grocery shop, requires a seated rest break for a couple of minutes.    Patient Stated Goals  Wants to be able to use right arm and hand, and to feel more confortable with going shopping with her husband.    Currently in Pain?  Yes    Pain Score  7     Pain Location  Foot    Pain Orientation  Right    Pain Descriptors / Indicators  Aching    Pain Type  Acute pain    Pain Onset  Today    Pain Frequency  Intermittent    Multiple Pain Sites  No         Treatment:  Neuromuscular training: Step over hurdle fwd/bwd, side to side x 20  Rocker board fwd/bwd x 20  side stepping left and right in parallel bars 10 feet x 3 standing on blue foam with head turns x 1 min  Standing feet together on blue foam with head turns x 1 min step ups from floor to 6 inch stool x 20 bilateral Step ups from blue foam to 6 inch stool x 20 BLE Leg press 25 lbs x 20 x 2     Pt educated throughout session about proper posture and technique with exercises. Improved exercise technique, movement at target joints, use of target muscles after min to mod verbal, visual, tactile cues. CGA and Min to mod verbal cues used throughout with increased in postural sway and LOB most seen with narrow base of support and while on uneven surfaces. Continues to have balance deficits typical with diagnosis. Patient performs  intermediate level exercises without pain behaviors and needs verbal cuing for postural alignment and head positioning                      PT Education - 03/06/19 1522    Education Details  HEP    Person(s) Educated  Patient    Methods  Explanation    Comprehension  Verbalized understanding       PT Short Term Goals - 01/18/19 1408      PT SHORT TERM GOAL #1   Title  Patient will be independent in initial home exercise program to improve strength/mobility for better functional independence with ADLs.    Baseline  Not made yet., 12/28/18: given a HEP but still needs cues for proper technique    Time  6    Period  Weeks    Status  Partially Met    Target Date  01/24/19      PT SHORT TERM GOAL #2   Title  Patient (> 73 years old) will complete five times sit to stand test in < 15 seconds indicating an increased LE strength and improved balance for decreased falls risk.    Baseline  12/13/18: 17.52 sec; 12/28/18: 13.53 sec without HHA    Time  6    Period  Weeks    Status  Achieved    Target Date  01/24/19        PT Long Term Goals - 01/18/19 1408      PT LONG TERM GOAL #1   Title  Patient will increase 10 meter walk test to >1.66ms as to improve gait speed for better community ambulation and to reduce fall risk.    Baseline  12/13/18: 0.64 m/s with rollator, 0.72 m/s without AD; 12/28/18: 0.84 m/s without AD 11/25: 1.0 m/s without AD    Time  12    Period  Weeks    Status  Achieved      PT LONG TERM GOAL #2   Title  Patient will increase six minute walk test distance to >1000 for progression to community ambulator and improve gait ability.    Baseline  12/13/18: 677ft with rollator; 12/28/18: deferred 11/25: 723 ft without an AD, 01/18/19 640    Time  12    Period  Weeks    Status  Partially Met    Target Date  03/07/19      PT LONG TERM GOAL #3   Title  Patient will be require no assist with ascend/descend 3 steps using Least restrictive assistive  device without  LOB to improve ability to get in/out of home.    Baseline  Dependent on rail assist to reach front door. 12/28/18: uses rail, one step at a time 11/25: able to ascend/descend step over step with BUE support, able to perform step to pattern with SUE support12/2/20=able to perform step to pattern with SUE support12    Time  12    Period  Weeks    Status  Partially Met    Target Date  01/18/19      PT LONG TERM GOAL #4   Title  Patient will increase BLE gross strength to 4+/5 as to improve functional strength for independent gait, increased standing tolerance and increased ADL ability.    Baseline  12/13/18: RLE hip weakness; 12/28/18: RLE hip extension: 2+/5, hip abduction/adduction 3-/5, knee grossly 4-/5 11/25: RLE hip extension 3-/5 add/abd 3/5 knee 4/512/2/20=RLE hip extension 3-/5 add/abd 3/5 knee 4/5    Time  12    Period  Weeks    Status  Partially Met      PT LONG TERM GOAL #5   Title  Patient will increase Functional Gait Assessment score to >20/30 as to reduce fall risk and improve dynamic gait safety with community ambulation.    Baseline  12/28/18: 13/24, 01/18/19 13/30    Time  12    Period  Weeks    Status  New    Target Date  03/06/18            Plan - 03/06/19 1532    Clinical Impression Statement    Pt demonstrates better base of support with correct space between feet and improved step length progressing to reciprocal pattern. Performance improves  with increased repetition with improved motor planning and improved step position.     Personal Factors and Comorbidities  Age;Comorbidity 2;Fitness;Time since onset of injury/illness/exacerbation    Comorbidities  HTN, Hx of stroke    Examination-Activity Limitations  Bathing;Bed Mobility;Bend;Carry;Dressing;Lift;Locomotion Level;Reach Overhead;Squat;Stairs;Stand;Transfers    Examination-Participation Restrictions  Cleaning;Community Activity;Driving;Laundry;Shop;Yard Work    Stability/Clinical Decision  Making  Stable/Uncomplicated    Rehab Potential  Good    PT Frequency  2x / week    PT Duration  12 weeks    PT Treatment/Interventions  ADLs/Self Care Home Management;Aquatic Therapy;Cryotherapy;Electrical Stimulation;Moist Heat;DME Instruction;Gait training;Stair training;Functional mobility training;Therapeutic activities;Therapeutic exercise;Balance training;Neuromuscular re-education;Patient/family education;Orthotic Fit/Training;Manual techniques;Compression bandaging;Passive range of motion;Dry needling;Energy conservation;Taping    PT Next Visit Plan  review HEP, balance    PT Home Exercise Plan  No updates this date    Consulted and Agree with Plan of Care  Patient       Patient will benefit from skilled therapeutic intervention in order to improve the following deficits and impairments:  Abnormal gait, Decreased balance, Decreased endurance, Decreased mobility, Difficulty walking, Hypomobility, Decreased range of motion, Improper body mechanics, Decreased activity tolerance, Decreased strength, Impaired UE functional use, Postural dysfunction  Visit Diagnosis: Other lack of coordination  Muscle weakness (generalized)  History of stroke  Hemiplegia and hemiparesis following cerebral infarction affecting right dominant side (HCC)  Unsteadiness on feet  Other abnormalities of gait and mobility  Localized edema     Problem List Patient Active Problem List   Diagnosis Date Noted  . Mixed hyperlipidemia 09/28/2018  . Middle cerebral artery embolism, left 04/18/2018  . History of stroke 04/17/2018  . IFG (impaired fasting glucose) 03/28/2018  . Essential hypertension, benign 01/15/2017  . Asthma 01/15/2017    Alanson Puls, PT DPT 03/06/2019, 3:50 PM  [place] MAIN [facility] SERVICES [address] [place], [place], [address] Phone: [phone]   Fax:  [phone]  Name: Kristin Solis MRN: [id number] Date of Birth:  1955-08-16

## 2019-03-08 ENCOUNTER — Telehealth: Payer: Self-pay

## 2019-03-09 ENCOUNTER — Encounter: Payer: Self-pay | Admitting: Occupational Therapy

## 2019-03-09 ENCOUNTER — Ambulatory Visit: Payer: Medicaid Other | Admitting: Occupational Therapy

## 2019-03-09 ENCOUNTER — Ambulatory Visit: Payer: Medicaid Other | Admitting: Physical Therapy

## 2019-03-09 ENCOUNTER — Encounter: Payer: Self-pay | Admitting: Physical Therapy

## 2019-03-09 ENCOUNTER — Other Ambulatory Visit: Payer: Self-pay

## 2019-03-09 DIAGNOSIS — R278 Other lack of coordination: Secondary | ICD-10-CM | POA: Diagnosis not present

## 2019-03-09 DIAGNOSIS — R2689 Other abnormalities of gait and mobility: Secondary | ICD-10-CM

## 2019-03-09 DIAGNOSIS — Z8673 Personal history of transient ischemic attack (TIA), and cerebral infarction without residual deficits: Secondary | ICD-10-CM | POA: Diagnosis not present

## 2019-03-09 DIAGNOSIS — R600 Localized edema: Secondary | ICD-10-CM

## 2019-03-09 DIAGNOSIS — R2681 Unsteadiness on feet: Secondary | ICD-10-CM | POA: Diagnosis not present

## 2019-03-09 DIAGNOSIS — M6281 Muscle weakness (generalized): Secondary | ICD-10-CM

## 2019-03-09 DIAGNOSIS — I69351 Hemiplegia and hemiparesis following cerebral infarction affecting right dominant side: Secondary | ICD-10-CM | POA: Diagnosis not present

## 2019-03-09 NOTE — Therapy (Signed)
[place] MAIN [facility] SERVICES [address] [place], [place], [address] Phone: [phone]   Fax:  [phone]  Physical Therapy Treatment  Patient Details  Name: Kristin Solis MRN: [id number] Date of Birth: 11/11/1955 Referring Provider (PT): Kristin Solis   Encounter Date: 03/09/2019  PT End of Session - 03/09/19 1552    Visit Number  5    Number of Visits  25    Date for PT Re-Evaluation  03/07/19    Authorization Type  eval 10/27    Authorization - Visit Number  3    Authorization - Number of Visits  12    PT Start Time  0345    PT Stop Time  0425    PT Time Calculation (min)  40 min    Equipment Utilized During Treatment  Gait belt    Activity Tolerance  Patient tolerated treatment well;No increased pain;Patient limited by fatigue    Behavior During Therapy  [facility] for tasks assessed/performed       Past Medical History:  Diagnosis Date  . Asthma   . Hypertension   . Stroke [facility])     Past Surgical History:  Procedure Laterality Date  . ABDOMINAL HYSTERECTOMY  2005   total  . IR CT HEAD LTD  04/18/2018  . IR INTRAVSC STENT CERV CAROTID W/O EMB-PROT MOD SED INC ANGIO  04/18/2018  . IR PERCUTANEOUS ART THROMBECTOMY/INFUSION INTRACRANIAL INC DIAG ANGIO  04/18/2018  . OOPHORECTOMY    . RADIOLOGY WITH ANESTHESIA N/A 04/17/2018   Procedure: RADIOLOGY WITH ANESTHESIA;  Surgeon: Kristin Bras, MD;  Location: [place];  Service: Radiology;  Laterality: N/A;    There were no vitals filed for this visit.  Subjective Assessment - 03/09/19 1551    Subjective  Patient reported compliance with HEP,  no changes to medications, falls, or stumbles    Patient is accompained by:  Family member    Pertinent History  Patient is a 64 y.o. female with history of stroke receiving care previously from [facility] at [facility]. She was initially admitted to ED on 04/17/18 with dysphagia and right sided weakness s/p left MCA CVA, extubated on 04/19/18  and discharged on 04/23/18 to private residence. Was independent with ADLs/IADLs prior to admission. Inpatient PT reported decreased safety awareness and awareness of deficits, with deficits in the areas of balance, ambulation ability, transfers. Was able to ambulate 125 feet in hospital with min guard without AD, and independent with bed mobility.    Limitations  Lifting;Standing;Walking;Writing;House hold activities    How long can you sit comfortably?  As long as desired    How long can you stand comfortably?  5 minutes    How long can you walk comfortably?  Able to grocery shop, requires a seated rest break for a couple of minutes.    Patient Stated Goals  Wants to be able to use right arm and hand, and to feel more confortable with going shopping with her husband.    Currently in Pain?  No/denies    Pain Score  0-No pain    Pain Onset  Today       Treatment:  Neuromuscular; Matrix fwd/ bwd x 3 reps , 22. 5 lbs   Therapeutic exercise: Nu-step warm up x 5 mins L 2  Supine: SLR x 15 BLE Hookling marching x 15  Hooklying abd/ER x 15  Bridging x 15 SAQ x 15 BLE Hip abd/add x 15, BLE Heel slides x 15,  BLE Trunk rotation left and right x 15  Sidelying: Hip abd x 15 , BLE  Calm x 15 , BLE Pt educated throughout session about proper posture and technique with exercises. Improved exercise technique, movement at target joints, use of target muscles after min to mod verbal, visual, tactile cues.                        PT Education - 03/09/19 1551    Education Details  HEP    Person(s) Educated  Patient    Methods  Explanation    Comprehension  Verbalized understanding;Returned demonstration;Need further instruction       PT Short Term Goals - 01/18/19 1408      PT SHORT TERM GOAL #1   Title  Patient will be independent in initial home exercise program to improve strength/mobility for better functional independence with ADLs.    Baseline  Not made yet.,  12/28/18: given a HEP but still needs cues for proper technique    Time  6    Period  Weeks    Status  Partially Met    Target Date  01/24/19      PT SHORT TERM GOAL #2   Title  Patient (> 65 years old) will complete five times sit to stand test in < 15 seconds indicating an increased LE strength and improved balance for decreased falls risk.    Baseline  12/13/18: 17.52 sec; 12/28/18: 13.53 sec without HHA    Time  6    Period  Weeks    Status  Achieved    Target Date  01/24/19        PT Long Term Goals - 01/18/19 1408      PT LONG TERM GOAL #1   Title  Patient will increase 10 meter walk test to >1.102ms as to improve gait speed for better community ambulation and to reduce fall risk.    Baseline  12/13/18: 0.64 m/s with rollator, 0.72 m/s without AD; 12/28/18: 0.84 m/s without AD 11/25: 1.0 m/s without AD    Time  12    Period  Weeks    Status  Achieved      PT LONG TERM GOAL #2   Title  Patient will increase six minute walk test distance to >1000 for progression to community ambulator and improve gait ability.    Baseline  12/13/18: 650ft with rollator; 12/28/18: deferred 11/25: 723 ft without an AD, 01/18/19 640    Time  12    Period  Weeks    Status  Partially Met    Target Date  03/07/19      PT LONG TERM GOAL #3   Title  Patient will be require no assist with ascend/descend 3 steps using Least restrictive assistive device without LOB to improve ability to get in/out of home.    Baseline  Dependent on rail assist to reach front door. 12/28/18: uses rail, one step at a time 11/25: able to ascend/descend step over step with BUE support, able to perform step to pattern with SUE support12/2/20=able to perform step to pattern with SUE support12    Time  12    Period  Weeks    Status  Partially Met    Target Date  01/18/19      PT LONG TERM GOAL #4   Title  Patient will increase BLE gross strength to 4+/5 as to improve functional strength for independent gait, increased  standing  tolerance and increased ADL ability.    Baseline  12/13/18: RLE hip weakness; 12/28/18: RLE hip extension: 2+/5, hip abduction/adduction 3-/5, knee grossly 4-/5 11/25: RLE hip extension 3-/5 add/abd 3/5 knee 4/512/2/20=RLE hip extension 3-/5 add/abd 3/5 knee 4/5    Time  12    Period  Weeks    Status  Partially Met      PT LONG TERM GOAL #5   Title  Patient will increase Functional Gait Assessment score to >20/30 as to reduce fall risk and improve dynamic gait safety with community ambulation.    Baseline  12/28/18: 13/24, 01/18/19 13/30    Time  12    Period  Weeks    Status  New    Target Date  03/06/18            Plan - 03/09/19 1601    Clinical Impression Statement  Patient demonstrating increased coordination of muscle activation with smoother movements during strengthening interventions in close chained. Decreased fatigue at end of set implicates increased strength additionally. Patient continues to be challenged by dynamic surfaces and single limb stance. Patient will continue to benefit from skilled physical therapy to improve gait mechanics, strength, and balance for return to PLOF   Personal Factors and Comorbidities  Age;Comorbidity 2;Fitness;Time since onset of injury/illness/exacerbation    Comorbidities  HTN, Hx of stroke    Examination-Activity Limitations  Bathing;Bed Mobility;Bend;Carry;Dressing;Lift;Locomotion Level;Reach Overhead;Squat;Stairs;Stand;Transfers    Examination-Participation Restrictions  Cleaning;Community Activity;Driving;Laundry;Shop;Yard Work    Stability/Clinical Decision Making  Stable/Uncomplicated    Rehab Potential  Good    PT Frequency  2x / week    PT Duration  12 weeks    PT Treatment/Interventions  ADLs/Self Care Home Management;Aquatic Therapy;Cryotherapy;Electrical Stimulation;Moist Heat;DME Instruction;Gait training;Stair training;Functional mobility training;Therapeutic activities;Therapeutic exercise;Balance  training;Neuromuscular re-education;Patient/family education;Orthotic Fit/Training;Manual techniques;Compression bandaging;Passive range of motion;Dry needling;Energy conservation;Taping    PT Next Visit Plan  review HEP, balance    PT Home Exercise Plan  No updates this date    Consulted and Agree with Plan of Care  Patient       Patient will benefit from skilled therapeutic intervention in order to improve the following deficits and impairments:  Abnormal gait, Decreased balance, Decreased endurance, Decreased mobility, Difficulty walking, Hypomobility, Decreased range of motion, Improper body mechanics, Decreased activity tolerance, Decreased strength, Impaired UE functional use, Postural dysfunction  Visit Diagnosis: Other lack of coordination  Muscle weakness (generalized)  History of stroke  Hemiplegia and hemiparesis following cerebral infarction affecting right dominant side (HCC)  Unsteadiness on feet  Other abnormalities of gait and mobility  Localized edema     Problem List Patient Active Problem List   Diagnosis Date Noted  . Mixed hyperlipidemia 09/28/2018  . Middle cerebral artery embolism, left 04/18/2018  . History of stroke 04/17/2018  . IFG (impaired fasting glucose) 03/28/2018  . Essential hypertension, benign 01/15/2017  . Asthma 01/15/2017    Alanson Puls, PT DPT 03/09/2019, 4:02 PM  [place] MAIN [facility] SERVICES [address] [place], [place], [address] Phone: [phone]   Fax:  [phone]  Name: SHAUNTEL PREST MRN: [id number] Date of Birth: September 02, 1955

## 2019-03-09 NOTE — Therapy (Signed)
[place] [facility] MAIN [facility] SERVICES [address] [place], [place], [address] Phone: [phone]   Fax:  [phone]  Occupational Therapy Treatment  Patient Details  Name: Kristin Solis MRN: [id number] Date of Birth: 31-Dec-1955 Referring Provider (OT): Aura Dials   Encounter Date: 03/09/2019  OT End of Session - 03/09/19 1623    Visit Number  18    Number of Visits  37    Date for OT Re-Evaluation  04/28/18    OT Start Time  1500    OT Stop Time  1545    OT Time Calculation (min)  45 min    Activity Tolerance  Patient tolerated treatment well    Behavior During Therapy  [facility] for tasks assessed/performed       Past Medical History:  Diagnosis Date  . Asthma   . Hypertension   . Stroke [facility])     Past Surgical History:  Procedure Laterality Date  . ABDOMINAL HYSTERECTOMY  2005   total  . IR CT HEAD LTD  04/18/2018  . IR INTRAVSC STENT CERV CAROTID W/O EMB-PROT MOD SED INC ANGIO  04/18/2018  . IR PERCUTANEOUS ART THROMBECTOMY/INFUSION INTRACRANIAL INC DIAG ANGIO  04/18/2018  . OOPHORECTOMY    . RADIOLOGY WITH ANESTHESIA N/A 04/17/2018   Procedure: RADIOLOGY WITH ANESTHESIA;  Surgeon: Julieanne Cotton, MD;  Location: [facility] OR;  Service: Radiology;  Laterality: N/A;    There were no vitals filed for this visit.  Subjective Assessment - 03/09/19 1622    Subjective   Pt. continues to be happy with her progress.    Pertinent History  Patient is a 64 y.o. female with history of stroke,  admitted to ED on 04/17/18 with dysphagia and right sided weakness s/p left MCA CVA, extubated on 04/19/18 and discharged on 04/23/18 to private residence    Patient Stated Goals  Patient reports she would like to get her right hand to move more and be able to use for daily tasks.    Currently in Pain?  No/denies        OT TREATMENT    Neuro muscular re-education:  Pt. worked on using her right hand to grasp 1", and 3/4" washers from a magnetic dish, and  stacking them. Pt. attempted to grasp, and store a couple of the washers.  Therapeutic Exercise:  Pt. tolerated AAROM for right shoulder flexion, abduction, and horizontal abduction. Moist heat was applied to the  Right shoulder for 10 min. Prior to ROM during manual techniques with the right hand.  Manual Therapy:  Pt. tolerated retrograde massage for edema control with the right forearm, and hand in elevation.Manual techniques were performed independent of, and prior to Ther. Ex. manual techniques were performed following contrast hot, and cold reps Alternating heat 3 min, cold 1 min. 3 reps.   Response to Treatment:  Pt. continues to make excellent progress with her RUE, and hand. Pt. is using her right UE, and hand more at home. Pt. is able to use it for writing, and reaching up to wash her hair. Pt. reports that she was laying down in bed, rolled over, and heard a pop in her shoulder which she reported did not hurt. Pt. reports experiencing that she has been able to reach up further to her head, and wash her hair. Right hand edema is improving. Pt. continues to work on improving overall LUE functioning during ADLs, and IADL tasks.  OT Education - 03/09/19 1623    Education Details  RUE functioning, hand exercises, [facility]    Person(s) Educated  Patient    Methods  Explanation;Demonstration    Comprehension  Verbalized understanding;Returned demonstration          OT Long Term Goals - 03/01/19 2022      OT LONG TERM GOAL #1   Title  Patient will complete home exercise program with modified independence.    Baseline  addition of new exercises ongoing as patient progresses with RUE.    Time  12    Period  Weeks    Status  On-going    Target Date  04/28/19      OT LONG TERM GOAL #2   Title  Patient will demonstrate understanding of Edema control measures for her right upper extremity and implement into daily routine.    Baseline  able to demo  understanding and methods of edema control    Time  3    Period  Weeks    Status  Achieved      OT LONG TERM GOAL #3   Title  Patient will demonstrate improved grip by 10 # in right Hand to be able to hold items in hand with gross grasp.    Baseline  2# at eval, 10# on 03/01/2019    Time  6    Period  Weeks    Status  Revised    Target Date  04/28/19      OT LONG TERM GOAL #4   Title  Patient will increase range of motion of fingers in right hand to be able to grasp built-up utensils for self feeding with modified independence.    Baseline  unable to feed self or hold utensils with right hand    Time  6    Period  Months    Status  Achieved      OT LONG TERM GOAL #5   Title  Patient will improve right hand function to be able to grasp pen to sign name on important papers with good legibility.    Baseline  unable to hold pen or sign name, Update:  able to grasp large built up pen and starting to form strokes for handwriting.  Achieved and revised to good legiblity.    Time  12    Period  Weeks    Status  On-going    Target Date  04/28/19      OT LONG TERM GOAL #6   Title  Patient will hold a cup with modified independence.    Baseline  unable at eval with right hand, update on 02/02/19:  patient able to gross hold cup but has difficulty with maintaining grasp and depending on the weight and contents.    Time  12    Period  Weeks    Status  Achieved      OT LONG TERM GOAL #7   Title  Patient will improve right hand function to manipulate buttons, snaps, zippers with modified independence.    Baseline  difficulty at eval and requires assist at times.  Update:  can do some zippers but no buttons to date    Time  12    Period  Weeks    Status  On-going    Target Date  04/28/19      OT LONG TERM GOAL #8   Title  Patient will demonstrate the ability to stir items in a bowl of varying consistencies with  modified independence.    Baseline  unable 02/02/2019    Time  12    Period   Weeks    Status  On-going    Target Date  04/28/19      OT LONG TERM GOAL  #9   TITLE  Patient will demonstrate the ability to move pots and pans from one location to another in the kitchen with modified independence.    Baseline  02/02/19 unable    Time  12    Period  Weeks    Status  On-going    Target Date  04/28/19      OT LONG TERM GOAL  #10   TITLE  Patient will improve right shoulder flexion by 10 degrees to assist with reaching and placing objects on table.    Baseline  02/02/2019:  right shoulder flexion 45 degrees, 03/01/2019 60 degrees    Time  12    Period  Weeks    Status  On-going    Target Date  04/28/19            Plan - 03/09/19 1624    Clinical Impression Statement  Pt. continues to make excellent progress with her RUE, and hand. Pt. is using her right UE, and hand more at home. Pt. is able to use it for writing, and reaching up to wash her hair. Pt. reports that she was laying down in bed, rolled over, and heard a pop in her shoulder which she reported did not hurt. Pt. reports experiencing that she has been able to reach up further to her head, and wash her hair. Right hand edema is improving. Pt. continues to work on improving overall LUE functioning during ADLs, and IADL tasks.    OT Occupational Profile and History  Detailed Assessment- Review of Records and additional review of physical, cognitive, psychosocial history related to current functional performance    Occupational performance deficits (Please refer to evaluation for details):  ADL's;IADL's;Work;Leisure    Body Structure / Function / Physical Skills  ADL;Coordination;GMC;UE functional use;Balance;Decreased knowledge of use of DME;Flexibility;IADL;Dexterity;FMC;Proprioception;Strength;Edema;ROM    Cognitive Skills  Memory    Psychosocial Skills  Environmental  Adaptations;Habits;Routines and Behaviors    Rehab Potential  Good    Clinical Decision Making  Limited treatment options, no task  modification necessary    Comorbidities Affecting Occupational Performance:  May have comorbidities impacting occupational performance    Modification or Assistance to Complete Evaluation   No modification of tasks or assist necessary to complete eval    OT Frequency  2x / week    OT Duration  12 weeks    OT Treatment/Interventions  Self-care/ADL training;Cryotherapy;Therapeutic exercise;DME and/or AE instruction;Functional Mobility Training;Cognitive remediation/compensation;Balance training;Electrical Stimulation;Neuromuscular education;Manual Therapy;Splinting;Moist Heat;Contrast Bath;Therapeutic activities;Patient/family education;Passive range of motion    Consulted and Agree with Plan of Care  Patient       Patient will benefit from skilled therapeutic intervention in order to improve the following deficits and impairments:   Body Structure / Function / Physical Skills: ADL, Coordination, GMC, UE functional use, Balance, Decreased knowledge of use of DME, Flexibility, IADL, Dexterity, FMC, Proprioception, Strength, Edema, ROM Cognitive Skills: Memory Psychosocial Skills: Environmental  Adaptations, Habits, Routines and Behaviors   Visit Diagnosis: Muscle weakness (generalized)  Other lack of coordination    Problem List Patient Active Problem List   Diagnosis Date Noted  . Mixed hyperlipidemia 09/28/2018  . Middle cerebral artery embolism, left 04/18/2018  . History of stroke 04/17/2018  . IFG (impaired  fasting glucose) 03/28/2018  . Essential hypertension, benign 01/15/2017  . Asthma 01/15/2017    Harrel Carina, MS, OTR/L 03/09/2019, 4:33 PM  [place] MAIN [facility] SERVICES [address] [place], [place], [address] Phone: [phone]   Fax:  [phone]  Name: ZAKYIA GAGAN MRN: [id number] Date of Birth: [birth year]

## 2019-03-13 ENCOUNTER — Ambulatory Visit: Payer: Self-pay | Admitting: General Practice

## 2019-03-13 DIAGNOSIS — I10 Essential (primary) hypertension: Secondary | ICD-10-CM

## 2019-03-13 NOTE — Patient Instructions (Signed)
Visit Information  Goals Addressed            This Visit's Progress   . RNCM: "I need help with filling out this paperwork" (pt-stated)        Care Management   Initial Visit Note  03/13/2019 Name: Kristin Solis MRN: [id number] DOB: 1955/07/11  Subjective: "I need help with filling out this paperwork"    Assessment: Kristin Solis is a 64 y.o. year old female who sees [name], NP for primary care. The care management team was consulted for assistance with care management and care coordination needs related to Educational Needs Other filling out paperwork for LIS assistance .   Review of patient status, including review of consultants reports, relevant laboratory and other test results, and collaboration with appropriate care team members and the patient's provider was performed as part of comprehensive patient evaluation and provision of care management services.    SDOH (Social Determinants of Health) screening performed today: Financial Strain . See Care Plan for related entries.    Outpatient Encounter Medications as of 03/13/2019  Medication Sig  . albuterol (VENTOLIN HFA) 108 (90 Base) MCG/ACT inhaler Inhale 2 puffs into the lungs every 6 (six) hours as needed for wheezing or shortness of breath.  [name] aspirin EC 81 MG EC tablet Take 1 tablet (81 mg total) by mouth daily.  [name] atorvastatin (LIPITOR) 40 MG tablet Take 1 tablet (40 mg total) by mouth daily at 6 PM.  . fluticasone (FLONASE) 50 MCG/ACT nasal spray Place 2 sprays into both nostrils daily.  . Fluticasone-Salmeterol (ADVAIR DISKUS) 250-50 MCG/DOSE AEPB Inhale 1 puff into the lungs 2 (two) times daily.  [name] lisinopril (ZESTRIL) 5 MG tablet Take 1 tablet (5 mg total) by mouth daily.   No facility-administered encounter medications on file as of 03/13/2019.    Goals Addressed   The patient came to the office and ask for assistance in filling out paperwork for LIS assistance. The member verbalized it was hard for  her to write and needed help with filling it out and understanding the questions. Filled out LIS forms and instructed the patient where to sign forms and the other places for pcp, her spouse, and notary would have to sign.  The patient verbalized understanding.     Follow up plan:  The care management team will reach out to the patient again over the next 60 days.   Ms. Totten was given information about Care Management services today including:  1. Care Management services include personalized support from designated clinical staff supervised by a physician, including individualized plan of care and coordination with other care providers 2. 24/7 contact phone numbers for assistance for urgent and routine care needs. 3. The patient may stop Care Management services at any time (effective at the end of the month) by phone call to the office staff.  Patient agreed to services and verbal consent obtained.  Alto Denver RN, MSN, CCM Community Care Coordinator [place]  [facility] Mobile: [phone]         The patient verbalized understanding of instructions provided today and declined a print copy of patient instruction materials.   The care management team will reach out to the patient again over the next 60 days.   Alto Denver RN, MSN, CCM Community Care Coordinator [place]  [facility] Mobile: [phone]

## 2019-03-13 NOTE — Chronic Care Management (AMB) (Signed)
Care Management   Visit Note  03/13/2019 Name: Kristin Solis MRN: [id number] DOB: 1955/09/03  Subjective: "I need help filling out this paperwork"  Assessment: Kristin Solis is a 64 y.o. year old female who sees [place], Kristin Screws T, NP for primary care. The care management team was consulted for assistance with care management and care coordination needs related to Educational Needs Other help with filling out paperwork for LIS application due to low income and financial needs.   Review of patient status, including review of consultants reports, relevant laboratory and other test results, and collaboration with appropriate care team members and the patient's provider was performed as part of comprehensive patient evaluation and provision of care management services.    SDOH (Social Determinants of Health) screening performed today: Financial Strain . See Care Plan for related entries.    Outpatient Encounter Medications as of 03/13/2019  Medication Sig   albuterol (VENTOLIN HFA) 108 (90 Base) MCG/ACT inhaler Inhale 2 puffs into the lungs every 6 (six) hours as needed for wheezing or shortness of breath.   aspirin EC 81 MG EC tablet Take 1 tablet (81 mg total) by mouth daily.   atorvastatin (LIPITOR) 40 MG tablet Take 1 tablet (40 mg total) by mouth daily at 6 PM.   fluticasone (FLONASE) 50 MCG/ACT nasal spray Place 2 sprays into both nostrils daily.   Fluticasone-Salmeterol (ADVAIR DISKUS) 250-50 MCG/DOSE AEPB Inhale 1 puff into the lungs 2 (two) times daily.   lisinopril (ZESTRIL) 5 MG tablet Take 1 tablet (5 mg total) by mouth daily.   No facility-administered encounter medications on file as of 03/13/2019.   Goals Addressed            This Visit's Progress    RNCM: "I need help with filling out this paperwork" (pt-stated)       Current Barriers:   Knowledge Deficits related to requirements for Low Income Subsidy application procedure  Film/video editor.   Nurse  Case Manager Clinical Goal(s):   Over the next 30 days, patient will work with Clinic Pharmacist and LCSW to address needs related to financial constraints and medication needs  Interventions:   Evaluation of current treatment plan related to medication management and financial constraints impacting health outcomes and patient's adherence to plan as established by provider.  Met with patient in person to provide assistance with navigating LIS application and provided guidance for completion  Advised patient to present application to appropriate involved parties for completion  Collaborated with care management team regarding ongoing needs related to financial constraints affecting health outcomes  Discussed plans with patient for ongoing care management follow up and provided patient with direct contact information for care management team  Patient Self Care Activities:   Patient verbalizes understanding of plan to complete LIS application and follow up with care management team and provider  Performs ADL's independently  Calls provider office for new concerns or questions  Unable to independently completed needed documentation/paperwork  Initial goal documentation        Follow up plan:  The care management team will reach out to the patient again over the next 60 days.   Kristin Solis was given information about Care Management services today including:  1. Care Management services include personalized support from designated clinical staff supervised by a physician, including individualized plan of care and coordination with other care providers 2. 24/7 contact phone numbers for assistance for urgent and routine care needs. 3. The patient may stop Care Management  services at any time (effective at the end of the month) by phone call to the office staff.   Noreene Larsson RN, MSN, [facility] Mobile:  [phone]

## 2019-03-14 ENCOUNTER — Ambulatory Visit: Payer: Medicaid Other | Admitting: Occupational Therapy

## 2019-03-14 ENCOUNTER — Encounter: Payer: Self-pay | Admitting: Occupational Therapy

## 2019-03-14 ENCOUNTER — Ambulatory Visit: Payer: Medicaid Other | Admitting: Physical Therapy

## 2019-03-14 ENCOUNTER — Encounter: Payer: Self-pay | Admitting: Physical Therapy

## 2019-03-14 ENCOUNTER — Other Ambulatory Visit: Payer: Self-pay

## 2019-03-14 DIAGNOSIS — R2689 Other abnormalities of gait and mobility: Secondary | ICD-10-CM

## 2019-03-14 DIAGNOSIS — M6281 Muscle weakness (generalized): Secondary | ICD-10-CM

## 2019-03-14 DIAGNOSIS — R600 Localized edema: Secondary | ICD-10-CM | POA: Diagnosis not present

## 2019-03-14 DIAGNOSIS — I69351 Hemiplegia and hemiparesis following cerebral infarction affecting right dominant side: Secondary | ICD-10-CM

## 2019-03-14 DIAGNOSIS — R2681 Unsteadiness on feet: Secondary | ICD-10-CM

## 2019-03-14 DIAGNOSIS — R278 Other lack of coordination: Secondary | ICD-10-CM

## 2019-03-14 DIAGNOSIS — Z8673 Personal history of transient ischemic attack (TIA), and cerebral infarction without residual deficits: Secondary | ICD-10-CM | POA: Diagnosis not present

## 2019-03-14 NOTE — Therapy (Signed)
[place] [facility] MAIN [facility] SERVICES [address] [place], [place], [address] Phone: [phone]   Fax:  [phone]  Occupational Therapy Treatment  Patient Details  Name: Kristin Solis MRN: [id number] Date of Birth: [birth year] Referring Provider (OT): Aura Dials   Encounter Date: 03/14/2019  OT End of Session - 03/14/19 1715    Visit Number  19    Number of Visits  37    Date for OT Re-Evaluation  04/28/18    OT Start Time  1445    OT Stop Time  1530    OT Time Calculation (min)  45 min    Activity Tolerance  Patient tolerated treatment well    Behavior During Therapy  [facility] for tasks assessed/performed       Past Medical History:  Diagnosis Date  . Asthma   . Hypertension   . Stroke [facility])     Past Surgical History:  Procedure Laterality Date  . ABDOMINAL HYSTERECTOMY  2005   total  . IR CT HEAD LTD  04/18/2018  . IR INTRAVSC STENT CERV CAROTID W/O EMB-PROT MOD SED INC ANGIO  04/18/2018  . IR PERCUTANEOUS ART THROMBECTOMY/INFUSION INTRACRANIAL INC DIAG ANGIO  04/18/2018  . OOPHORECTOMY    . RADIOLOGY WITH ANESTHESIA N/A 04/17/2018   Procedure: RADIOLOGY WITH ANESTHESIA;  Surgeon: Julieanne Cotton, MD;  Location: [facility] OR;  Service: Radiology;  Laterality: N/A;    There were no vitals filed for this visit.  Subjective Assessment - 03/14/19 1714    Subjective   Pt. reports she will not able to come to therapy if it snows.    Pertinent History  Patient is a 64 y.o. female with history of stroke,  admitted to ED on 04/17/18 with dysphagia and right sided weakness s/p left MCA CVA, extubated on 04/19/18 and discharged on 04/23/18 to private residence    Patient Stated Goals  Patient reports she would like to get her right hand to move more and be able to use for daily tasks.    Currently in Pain?  No/denies      OT TREATMENT    Neuro muscular re-education:  Pt. worked on using her right hand for grasping, and manipulating 1", 3/4", and 1/2"  washers from a magnetic dish, and worked on storing them in the palm of her hand. Pt. was able to store 7 washers at one time. Pt. worked on grasping them from a flat tabletop surface, and moving them with her 2nd digit off the edge of the table to her thumb.  Therapeutic Exercise:   Pt. Worked on using UE pulleys. Pt. Education was provided about proper pully use for Right shoulder stretching. Pt. Worked on AAROM using the [employer], and shoulder flexion at the wall. Pt. Required support at her right elbow during ROM. Pt. Worked on pinch strengthening in the left hand for lateral, and 3pt. pinch using yellow, and red resistive clips. Pt. worked on placing the clips at various vertical and horizontal angles. Tactile and verbal cues were required for eliciting the desired movement.  Response to Treatment:  Pt. is making steady progress. Pt.. is now able to use her RUE to reach up to the right side of her head. Pt. has difficulty maintaining her RUE in elevation. Pt. requires cues, and support at her right elbow during AAROM at the wall, and with the UE Ranger use. Pt. was able to tolerate pulley use well for stretching, and was able to maintain hold on  the pully with the right hand. Pt. continues to work on improving RUE functioning in order to improve and maximize independence.                        OT Education - 03/14/19 1715    Education Details  RUE functioning, hand exercises, [facility]    Person(s) Educated  Patient    Methods  Explanation;Demonstration    Comprehension  Verbalized understanding;Returned demonstration          OT Long Term Goals - 03/01/19 2022      OT LONG TERM GOAL #1   Title  Patient will complete home exercise program with modified independence.    Baseline  addition of new exercises ongoing as patient progresses with RUE.    Time  12    Period  Weeks    Status  On-going    Target Date  04/28/19      OT LONG TERM GOAL #2   Title  Patient  will demonstrate understanding of Edema control measures for her right upper extremity and implement into daily routine.    Baseline  able to demo understanding and methods of edema control    Time  3    Period  Weeks    Status  Achieved      OT LONG TERM GOAL #3   Title  Patient will demonstrate improved grip by 10 # in right Hand to be able to hold items in hand with gross grasp.    Baseline  2# at eval, 10# on 03/01/2019    Time  6    Period  Weeks    Status  Revised    Target Date  04/28/19      OT LONG TERM GOAL #4   Title  Patient will increase range of motion of fingers in right hand to be able to grasp built-up utensils for self feeding with modified independence.    Baseline  unable to feed self or hold utensils with right hand    Time  6    Period  Months    Status  Achieved      OT LONG TERM GOAL #5   Title  Patient will improve right hand function to be able to grasp pen to sign name on important papers with good legibility.    Baseline  unable to hold pen or sign name, Update:  able to grasp large built up pen and starting to form strokes for handwriting.  Achieved and revised to good legiblity.    Time  12    Period  Weeks    Status  On-going    Target Date  04/28/19      OT LONG TERM GOAL #6   Title  Patient will hold a cup with modified independence.    Baseline  unable at eval with right hand, update on 02/02/19:  patient able to gross hold cup but has difficulty with maintaining grasp and depending on the weight and contents.    Time  12    Period  Weeks    Status  Achieved      OT LONG TERM GOAL #7   Title  Patient will improve right hand function to manipulate buttons, snaps, zippers with modified independence.    Baseline  difficulty at eval and requires assist at times.  Update:  can do some zippers but no buttons to date    Time  12    Period  Weeks    Status  On-going    Target Date  04/28/19      OT LONG TERM GOAL #8   Title  Patient will  demonstrate the ability to stir items in a bowl of varying consistencies with modified independence.    Baseline  unable 02/02/2019    Time  12    Period  Weeks    Status  On-going    Target Date  04/28/19      OT LONG TERM GOAL  #9   TITLE  Patient will demonstrate the ability to move pots and pans from one location to another in the kitchen with modified independence.    Baseline  02/02/19 unable    Time  12    Period  Weeks    Status  On-going    Target Date  04/28/19      OT LONG TERM GOAL  #10   TITLE  Patient will improve right shoulder flexion by 10 degrees to assist with reaching and placing objects on table.    Baseline  02/02/2019:  right shoulder flexion 45 degrees, 03/01/2019 60 degrees    Time  12    Period  Weeks    Status  On-going    Target Date  04/28/19            Plan - 03/14/19 1716    Clinical Impression Statement  Pt. is making steady progress. Pt.. is now able to use her RUE to reach up to the right side of her head. Pt. has difficulty maintaining her RUE in elevation. Pt. requires cues, and support at her right elbow during AAROM at the wall, and with the UE Ranger use. Pt. was able to tolerate pulley use well for stretching, and was able to maintain hold on the pully with the right hand. Pt. continues to work on improving RUE functioning in order to improve and maximize independence.    OT Occupational Profile and History  Detailed Assessment- Review of Records and additional review of physical, cognitive, psychosocial history related to current functional performance    Occupational performance deficits (Please refer to evaluation for details):  ADL's;IADL's;Work;Leisure    Body Structure / Function / Physical Skills  ADL;Coordination;GMC;UE functional use;Balance;Decreased knowledge of use of DME;Flexibility;IADL;Dexterity;FMC;Proprioception;Strength;Edema;ROM    Cognitive Skills  Memory    Psychosocial Skills  Environmental  Adaptations;Habits;Routines  and Behaviors    Rehab Potential  Good    Clinical Decision Making  Limited treatment options, no task modification necessary    Comorbidities Affecting Occupational Performance:  May have comorbidities impacting occupational performance    Modification or Assistance to Complete Evaluation   No modification of tasks or assist necessary to complete eval    OT Frequency  2x / week    OT Duration  12 weeks    OT Treatment/Interventions  Self-care/ADL training;Cryotherapy;Therapeutic exercise;DME and/or AE instruction;Functional Mobility Training;Cognitive remediation/compensation;Balance training;Electrical Stimulation;Neuromuscular education;Manual Therapy;Splinting;Moist Heat;Contrast Bath;Therapeutic activities;Patient/family education;Passive range of motion    Consulted and Agree with Plan of Care  Patient       Patient will benefit from skilled therapeutic intervention in order to improve the following deficits and impairments:   Body Structure / Function / Physical Skills: ADL, Coordination, GMC, UE functional use, Balance, Decreased knowledge of use of DME, Flexibility, IADL, Dexterity, FMC, Proprioception, Strength, Edema, ROM Cognitive Skills: Memory Psychosocial Skills: Environmental  Adaptations, Habits, Routines and Behaviors   Visit Diagnosis: Muscle weakness (generalized)  Other lack of coordination    Problem  List Patient Active Problem List   Diagnosis Date Noted  . Mixed hyperlipidemia 09/28/2018  . Middle cerebral artery embolism, left 04/18/2018  . History of stroke 04/17/2018  . IFG (impaired fasting glucose) 03/28/2018  . Essential hypertension, benign 01/15/2017  . Asthma 01/15/2017    Harrel Carina, MS, OTR/L 03/14/2019, 5:23 PM  [place] MAIN [facility] SERVICES [address] [place], [place], [address] Phone: [phone]   Fax:  [phone]  Name: Kristin Solis MRN: [id number] Date of Birth: 09-Sep-1955

## 2019-03-14 NOTE — Therapy (Signed)
[place] MAIN [facility] SERVICES [address] [place], [place], [address] Phone: [phone]   Fax:  [phone]  Physical Therapy Treatment  Patient Details  Name: Kristin Solis MRN: [id number] Date of Birth: Mar 11, 1955 Referring Provider (PT): [name]   Encounter Date: 03/14/2019  PT End of Session - 03/14/19 1557    Number of Visits  25    Date for PT Re-Evaluation  03/07/19    Authorization Type  eval 10/27    Authorization - Visit Number  3    Authorization - Number of Visits  12    Equipment Utilized During Treatment  Gait belt    Activity Tolerance  Patient tolerated treatment well;No increased pain;Patient limited by fatigue    Behavior During Therapy  [facility] for tasks assessed/performed       Past Medical History:  Diagnosis Date  . Asthma   . Hypertension   . Stroke [facility])     Past Surgical History:  Procedure Laterality Date  . ABDOMINAL HYSTERECTOMY  2005   total  . IR CT HEAD LTD  04/18/2018  . IR INTRAVSC STENT CERV CAROTID W/O EMB-PROT MOD SED INC ANGIO  04/18/2018  . IR PERCUTANEOUS ART THROMBECTOMY/INFUSION INTRACRANIAL INC DIAG ANGIO  04/18/2018  . OOPHORECTOMY    . RADIOLOGY WITH ANESTHESIA N/A 04/17/2018   Procedure: RADIOLOGY WITH ANESTHESIA;  Surgeon: Luanne Bras, MD;  Location: [place];  Service: Radiology;  Laterality: N/A;    There were no vitals filed for this visit.  Subjective Assessment - 03/14/19 1757    Subjective  Patient reported compliance with HEP,  no changes to medications, falls, or stumbles , No pain.   Patient is accompained by:  Family member    Pertinent History  Patient is a 64 y.o. female with history of stroke receiving care previously from [facility] at [facility]. She was initially admitted to ED on 04/17/18 with dysphagia and right sided weakness s/p left MCA CVA, extubated on 04/19/18 and discharged on 04/23/18 to private residence. Was independent with ADLs/IADLs prior to  admission. Inpatient PT reported decreased safety awareness and awareness of deficits, with deficits in the areas of balance, ambulation ability, transfers. Was able to ambulate 125 feet in hospital with min guard without AD, and independent with bed mobility.    Limitations  Lifting;Standing;Walking;Writing;House hold activities    How long can you sit comfortably?  As long as desired    How long can you stand comfortably?  5 minutes    How long can you walk comfortably?  Able to grocery shop, requires a seated rest break for a couple of minutes.    Patient Stated Goals  Wants to be able to use right arm and hand, and to feel more confortable with going shopping with her husband.    Currently in Pain?  No/denies    Pain Score  0-No pain    Pain Onset  Today        Treatment: Matrix , 22. 5 lbs fwd/bwd x 4  TM x 3 mins . 5 miles/ min Nu-step x 5 mins L 3  Leg press 25 lbs single leg x 20 x 3 BLE Patient performed with instruction, verbal cues, tactile cues of therapist: goal: increase tissue extensibility, promote proper posture, improve mobility                       PT Education - 03/14/19 1557    Education Details  HEP    Person(s) Educated  Patient    Methods  Explanation    Comprehension  Verbalized understanding;Returned demonstration;Verbal cues required       PT Short Term Goals - 01/18/19 1408      PT SHORT TERM GOAL #1   Title  Patient will be independent in initial home exercise program to improve strength/mobility for better functional independence with ADLs.    Baseline  Not made yet., 12/28/18: given a HEP but still needs cues for proper technique    Time  6    Period  Weeks    Status  Partially Met    Target Date  01/24/19      PT SHORT TERM GOAL #2   Title  Patient (> 58 years old) will complete five times sit to stand test in < 15 seconds indicating an increased LE strength and improved balance for decreased falls risk.    Baseline   12/13/18: 17.52 sec; 12/28/18: 13.53 sec without HHA    Time  6    Period  Weeks    Status  Achieved    Target Date  01/24/19        PT Long Term Goals - 01/18/19 1408      PT LONG TERM GOAL #1   Title  Patient will increase 10 meter walk test to >1.52ms as to improve gait speed for better community ambulation and to reduce fall risk.    Baseline  12/13/18: 0.64 m/s with rollator, 0.72 m/s without AD; 12/28/18: 0.84 m/s without AD 11/25: 1.0 m/s without AD    Time  12    Period  Weeks    Status  Achieved      PT LONG TERM GOAL #2   Title  Patient will increase six minute walk test distance to >1000 for progression to community ambulator and improve gait ability.    Baseline  12/13/18: 6105ft with rollator; 12/28/18: deferred 11/25: 723 ft without an AD, 01/18/19 640    Time  12    Period  Weeks    Status  Partially Met    Target Date  03/07/19      PT LONG TERM GOAL #3   Title  Patient will be require no assist with ascend/descend 3 steps using Least restrictive assistive device without LOB to improve ability to get in/out of home.    Baseline  Dependent on rail assist to reach front door. 12/28/18: uses rail, one step at a time 11/25: able to ascend/descend step over step with BUE support, able to perform step to pattern with SUE support12/2/20=able to perform step to pattern with SUE support12    Time  12    Period  Weeks    Status  Partially Met    Target Date  01/18/19      PT LONG TERM GOAL #4   Title  Patient will increase BLE gross strength to 4+/5 as to improve functional strength for independent gait, increased standing tolerance and increased ADL ability.    Baseline  12/13/18: RLE hip weakness; 12/28/18: RLE hip extension: 2+/5, hip abduction/adduction 3-/5, knee grossly 4-/5 11/25: RLE hip extension 3-/5 add/abd 3/5 knee 4/512/2/20=RLE hip extension 3-/5 add/abd 3/5 knee 4/5    Time  12    Period  Weeks    Status  Partially Met      PT LONG TERM GOAL #5   Title   Patient will increase Functional Gait Assessment score to >20/30 as to reduce  fall risk and improve dynamic gait safety with community ambulation.    Baseline  12/28/18: 13/24, 01/18/19 13/30    Time  12    Period  Weeks    Status  New    Target Date  03/06/18            Plan - 03/14/19 1756    Clinical Impression Statement   Pt was able to perform all exercises today with CGA.[name] Pt was able to perform all balance and strength exercises, demonstrating improvements in LE strength and stability.  Pt was able to complete TM walking, showing ability to walk slowly on level urfaces with min assist .  Pt requires verbal, visual and tactile cues during exercise in order to complete tasks with proper form and technique  Pt would continue to benefit from skilled PT services in order to further strengthen LE's, improve static and dynamic balance, and improve coordination in order to increase functional mobility and decrease risk of falls   Personal Factors and Comorbidities  Age;Comorbidity 2;Fitness;Time since onset of injury/illness/exacerbation    Comorbidities  HTN, Hx of stroke    Examination-Activity Limitations  Bathing;Bed Mobility;Bend;Carry;Dressing;Lift;Locomotion Level;Reach Overhead;Squat;Stairs;Stand;Transfers    Examination-Participation Restrictions  Cleaning;Community Activity;Driving;Laundry;Shop;Yard Work    Stability/Clinical Decision Making  Stable/Uncomplicated    Rehab Potential  Good    PT Frequency  2x / week    PT Duration  12 weeks    PT Treatment/Interventions  ADLs/Self Care Home Management;Aquatic Therapy;Cryotherapy;Electrical Stimulation;Moist Heat;DME Instruction;Gait training;Stair training;Functional mobility training;Therapeutic activities;Therapeutic exercise;Balance training;Neuromuscular re-education;Patient/family education;Orthotic Fit/Training;Manual techniques;Compression bandaging;Passive range of motion;Dry needling;Energy conservation;Taping    PT Next Visit  Plan  review HEP, balance    PT Home Exercise Plan  No updates this date    Consulted and Agree with Plan of Care  Patient       Patient will benefit from skilled therapeutic intervention in order to improve the following deficits and impairments:  Abnormal gait, Decreased balance, Decreased endurance, Decreased mobility, Difficulty walking, Hypomobility, Decreased range of motion, Improper body mechanics, Decreased activity tolerance, Decreased strength, Impaired UE functional use, Postural dysfunction  Visit Diagnosis: Muscle weakness (generalized)  Other lack of coordination  History of stroke  Hemiplegia and hemiparesis following cerebral infarction affecting right dominant side (HCC)  Unsteadiness on feet  Other abnormalities of gait and mobility     Problem List Patient Active Problem List   Diagnosis Date Noted  . Mixed hyperlipidemia 09/28/2018  . Middle cerebral artery embolism, left 04/18/2018  . History of stroke 04/17/2018  . IFG (impaired fasting glucose) 03/28/2018  . Essential hypertension, benign 01/15/2017  . Asthma 01/15/2017    Alanson Puls, PT DPT 03/14/2019, 6:00 PM  [place] MAIN [facility] SERVICES [address] [place], [place], [address] Phone: [phone]   Fax:  [phone]  Name: Kristin Solis MRN: [id number] Date of Birth: 1955/09/30

## 2019-03-16 ENCOUNTER — Ambulatory Visit: Payer: Medicaid Other | Admitting: Physical Therapy

## 2019-03-16 ENCOUNTER — Encounter: Payer: Medicaid Other | Admitting: Occupational Therapy

## 2019-03-16 ENCOUNTER — Encounter: Payer: Self-pay | Admitting: Physical Therapy

## 2019-03-16 ENCOUNTER — Other Ambulatory Visit: Payer: Self-pay

## 2019-03-16 DIAGNOSIS — Z8673 Personal history of transient ischemic attack (TIA), and cerebral infarction without residual deficits: Secondary | ICD-10-CM | POA: Diagnosis not present

## 2019-03-16 DIAGNOSIS — R600 Localized edema: Secondary | ICD-10-CM

## 2019-03-16 DIAGNOSIS — M6281 Muscle weakness (generalized): Secondary | ICD-10-CM

## 2019-03-16 DIAGNOSIS — I69351 Hemiplegia and hemiparesis following cerebral infarction affecting right dominant side: Secondary | ICD-10-CM

## 2019-03-16 DIAGNOSIS — R278 Other lack of coordination: Secondary | ICD-10-CM

## 2019-03-16 DIAGNOSIS — R2689 Other abnormalities of gait and mobility: Secondary | ICD-10-CM

## 2019-03-16 DIAGNOSIS — R2681 Unsteadiness on feet: Secondary | ICD-10-CM

## 2019-03-16 NOTE — Therapy (Signed)
[facility] MAIN REHAB SERVICES [address] [place], [place], [address] Phone: [phone]   Fax:  [phone]  Physical Therapy Treatment  Patient Details  Name: Kristin Solis MRN: [id number] Date of Birth: 10/18/1955 Referring Provider (PT): Jojene [name]   Encounter Date: 03/16/2019    Past Medical History:  Diagnosis Date  . Asthma   . Hypertension   . Stroke (HCC)     Past Surgical History:  Procedure Laterality Date  . ABDOMINAL HYSTERECTOMY  2005   total  . IR CT HEAD LTD  04/18/2018  . IR INTRAVSC STENT CERV CAROTID W/O EMB-PROT MOD SED INC ANGIO  04/18/2018  . IR PERCUTANEOUS ART THROMBECTOMY/INFUSION INTRACRANIAL INC DIAG ANGIO  04/18/2018  . OOPHORECTOMY    . RADIOLOGY WITH ANESTHESIA N/A 04/17/2018   Procedure: RADIOLOGY WITH ANESTHESIA;  Surgeon: Deveshwar, Sanjeev, MD;  Location: [facility] OR;  Service: Radiology;  Laterality: N/A;    There were no vitals filed for this visit.  Subjective Assessment - 03/16/19 1554    Subjective  Patient reported compliance with HEP,  no changes to medications, falls, or stumbles    Patient is accompained by:  Family member    Pertinent History  Patient is a 64 y.o. female with history of stroke receiving care previously from [facility]. She was initially admitted to ED on 04/17/18 with dysphagia and right sided weakness s/p left MCA CVA, extubated on 04/19/18 and discharged on 04/23/18 to private residence. Was independent with ADLs/IADLs prior to admission. Inpatient PT reported decreased safety awareness and awareness of deficits, with deficits in the areas of balance, ambulation ability, transfers. Was able to ambulate 125 feet in hospital with min guard without AD, and independent with bed mobility.    Limitations  Lifting;Standing;Walking;Writing;House hold activities    How long can you sit comfortably?  As long as desired    How long can you stand comfortably?  5 minutes    How  long can you walk comfortably?  Able to grocery shop, requires a seated rest break for a couple of minutes.    Patient Stated Goals  Wants to be able to use right arm and hand, and to feel more confortable with going shopping with her husband.    Pain Onset  Today         Ther-ex  TM . 5 miles / hourx 4 mins , 2 mins  Leg press 25 lbs LLE x 15, RLE x 15, bLE 25  Heel raises x 15 x 2 sets High marching x 20 BLE LE foot foam, other LE 5 inch stool and trunk rotation left and right x 10   Patient needs occasional verbal cueing to improve posture and cueing to correctly perform exercises slowly, holding at end of range to increase motor firing of desired muscle to encourage fatigue.                        PT Education - 03/16/19 1556    Education Details  HEP    Person(s) Educated  Patient    Methods  Explanation    Comprehension  Verbalized understanding       PT Short Term Goals - 01/18/19 1408      PT SHORT TERM GOAL #1   Title  Patient will be independent in initial home exercise program to improve strength/mobility for better functional independence with ADLs.    Baseline  Not made yet.,   12/28/18: given a HEP but still needs cues for proper technique    Time  6    Period  Weeks    Status  Partially Met    Target Date  01/24/19      PT SHORT TERM GOAL #2   Title  Patient (> 87 years old) will complete five times sit to stand test in < 15 seconds indicating an increased LE strength and improved balance for decreased falls risk.    Baseline  12/13/18: 17.52 sec; 12/28/18: 13.53 sec without HHA    Time  6    Period  Weeks    Status  Achieved    Target Date  01/24/19        PT Long Term Goals - 01/18/19 1408      PT LONG TERM GOAL #1   Title  Patient will increase 10 meter walk test to >1.69ms as to improve gait speed for better community ambulation and to reduce fall risk.    Baseline  12/13/18: 0.64 m/s with rollator, 0.72 m/s without AD; 12/28/18:  0.84 m/s without AD 11/25: 1.0 m/s without AD    Time  12    Period  Weeks    Status  Achieved      PT LONG TERM GOAL #2   Title  Patient will increase six minute walk test distance to >1000 for progression to community ambulator and improve gait ability.    Baseline  12/13/18: 662ft with rollator; 12/28/18: deferred 11/25: 723 ft without an AD, 01/18/19 640    Time  12    Period  Weeks    Status  Partially Met    Target Date  03/07/19      PT LONG TERM GOAL #3   Title  Patient will be require no assist with ascend/descend 3 steps using Least restrictive assistive device without LOB to improve ability to get in/out of home.    Baseline  Dependent on rail assist to reach front door. 12/28/18: uses rail, one step at a time 11/25: able to ascend/descend step over step with BUE support, able to perform step to pattern with SUE support12/2/20=able to perform step to pattern with SUE support12    Time  12    Period  Weeks    Status  Partially Met    Target Date  01/18/19      PT LONG TERM GOAL #4   Title  Patient will increase BLE gross strength to 4+/5 as to improve functional strength for independent gait, increased standing tolerance and increased ADL ability.    Baseline  12/13/18: RLE hip weakness; 12/28/18: RLE hip extension: 2+/5, hip abduction/adduction 3-/5, knee grossly 4-/5 11/25: RLE hip extension 3-/5 add/abd 3/5 knee 4/512/2/20=RLE hip extension 3-/5 add/abd 3/5 knee 4/5    Time  12    Period  Weeks    Status  Partially Met      PT LONG TERM GOAL #5   Title  Patient will increase Functional Gait Assessment score to >20/30 as to reduce fall risk and improve dynamic gait safety with community ambulation.    Baseline  12/28/18: 13/24, 01/18/19 13/30    Time  12    Period  Weeks    Status  New    Target Date  03/06/18            Plan - 03/16/19 1631    Clinical Impression Statement  Pt demonstrates increased postural sway when standing on uneven surface and requires //  bars to steady, and demonstrates fatigue at end of set of exercises focused on strength and endurance.  Patient will continue to benefit from skilled PT for improved balance and strength   Personal Factors and Comorbidities  Age;Comorbidity 2;Fitness;Time since onset of injury/illness/exacerbation    Comorbidities  HTN, Hx of stroke    Examination-Activity Limitations  Bathing;Bed Mobility;Bend;Carry;Dressing;Lift;Locomotion Level;Reach Overhead;Squat;Stairs;Stand;Transfers    Examination-Participation Restrictions  Cleaning;Community Activity;Driving;Laundry;Shop;Yard Work    Stability/Clinical Decision Making  Stable/Uncomplicated    Rehab Potential  Good    PT Frequency  2x / week    PT Duration  12 weeks    PT Treatment/Interventions  ADLs/Self Care Home Management;Aquatic Therapy;Cryotherapy;Electrical Stimulation;Moist Heat;DME Instruction;Gait training;Stair training;Functional mobility training;Therapeutic activities;Therapeutic exercise;Balance training;Neuromuscular re-education;Patient/family education;Orthotic Fit/Training;Manual techniques;Compression bandaging;Passive range of motion;Dry needling;Energy conservation;Taping    PT Next Visit Plan  review HEP, balance    PT Home Exercise Plan  No updates this date    Consulted and Agree with Plan of Care  Patient       Patient will benefit from skilled therapeutic intervention in order to improve the following deficits and impairments:  Abnormal gait, Decreased balance, Decreased endurance, Decreased mobility, Difficulty walking, Hypomobility, Decreased range of motion, Improper body mechanics, Decreased activity tolerance, Decreased strength, Impaired UE functional use, Postural dysfunction  Visit Diagnosis: Muscle weakness (generalized)  Other lack of coordination  Hemiplegia and hemiparesis following cerebral infarction affecting right dominant side (HCC)  History of stroke  Unsteadiness on feet  Other abnormalities of gait  and mobility  Localized edema     Problem List Patient Active Problem List   Diagnosis Date Noted  . Mixed hyperlipidemia 09/28/2018  . Middle cerebral artery embolism, left 04/18/2018  . History of stroke 04/17/2018  . IFG (impaired fasting glucose) 03/28/2018  . Essential hypertension, benign 01/15/2017  . Asthma 01/15/2017    Alanson Puls, PT DPT 03/16/2019, 4:41 PM  [place] MAIN [facility] SERVICES [address] [place], [place], [address] Phone: [phone]   Fax:  [phone]  Name: Kristin Solis MRN: [id number] Date of Birth: 1955/09/23

## 2019-03-21 ENCOUNTER — Encounter: Payer: Self-pay | Admitting: Physical Therapy

## 2019-03-21 ENCOUNTER — Ambulatory Visit: Payer: Medicaid Other | Attending: Nurse Practitioner | Admitting: Occupational Therapy

## 2019-03-21 ENCOUNTER — Other Ambulatory Visit: Payer: Self-pay

## 2019-03-21 ENCOUNTER — Encounter: Payer: Self-pay | Admitting: Occupational Therapy

## 2019-03-21 ENCOUNTER — Ambulatory Visit: Payer: Medicaid Other | Admitting: Physical Therapy

## 2019-03-21 DIAGNOSIS — M6281 Muscle weakness (generalized): Secondary | ICD-10-CM

## 2019-03-21 DIAGNOSIS — R2689 Other abnormalities of gait and mobility: Secondary | ICD-10-CM | POA: Diagnosis not present

## 2019-03-21 DIAGNOSIS — R2681 Unsteadiness on feet: Secondary | ICD-10-CM

## 2019-03-21 DIAGNOSIS — I69351 Hemiplegia and hemiparesis following cerebral infarction affecting right dominant side: Secondary | ICD-10-CM | POA: Diagnosis not present

## 2019-03-21 DIAGNOSIS — Z8673 Personal history of transient ischemic attack (TIA), and cerebral infarction without residual deficits: Secondary | ICD-10-CM

## 2019-03-21 DIAGNOSIS — R600 Localized edema: Secondary | ICD-10-CM

## 2019-03-21 DIAGNOSIS — R278 Other lack of coordination: Secondary | ICD-10-CM | POA: Diagnosis not present

## 2019-03-21 NOTE — Therapy (Signed)
[place] MAIN [facility] SERVICES [address] [place], [place], [address] Phone: [phone]   Fax:  [phone]  Occupational Therapy Progress Note  Dates of reporting period  02/05/2019   to   03/21/2019  Patient Details  Name: SOLENNE MANWARREN MRN: [id number] Date of Birth: 14-Jul-1955 Referring Provider (OT): Marnee Guarneri   Encounter Date: 03/21/2019  OT End of Session - 03/21/19 1155    Visit Number  20    Number of Visits  37    Date for OT Re-Evaluation  04/28/18    Authorization Type  Progress report period strating 01/26/2019    OT Start Time  1103    OT Stop Time  1145    OT Time Calculation (min)  42 min    Activity Tolerance  Patient tolerated treatment well    Behavior During Therapy  [facility] for tasks assessed/performed       Past Medical History:  Diagnosis Date  . Asthma   . Hypertension   . Stroke [facility])     Past Surgical History:  Procedure Laterality Date  . ABDOMINAL HYSTERECTOMY  2005   total  . IR CT HEAD LTD  04/18/2018  . IR INTRAVSC STENT CERV CAROTID W/O EMB-PROT MOD SED INC ANGIO  04/18/2018  . IR PERCUTANEOUS ART THROMBECTOMY/INFUSION INTRACRANIAL INC DIAG ANGIO  04/18/2018  . OOPHORECTOMY    . RADIOLOGY WITH ANESTHESIA N/A 04/17/2018   Procedure: RADIOLOGY WITH ANESTHESIA;  Surgeon: Luanne Bras, MD;  Location: [facility];  Service: Radiology;  Laterality: N/A;    There were no vitals filed for this visit.  Subjective Assessment - 03/21/19 1153    Subjective   Pt. reports that she is doing well, and is making progress    Patient is accompanied by:  Family member    Pertinent History  Patient is a 64 y.o. female with history of stroke,  admitted to ED on 04/17/18 with dysphagia and right sided weakness s/p left MCA CVA, extubated on 04/19/18 and discharged on 04/23/18 to private residence    Patient Stated Goals  Patient reports she would like to get her right hand to move more and be able to use for daily tasks.    Currently in Pain?  No/denies      OT TREATMENT    Selfcare:  Pt. worked on Media planner with checkwriting tasks. Pt. was able to complete the checks with 75% legibility with positive deviation above the line. Pt. Required increased time to complete.   Manual Therapy:  Pt. Tolerated soft tissue mobilization for carpal rolls, and metacarpal spread stretches to prepare her right hand for ROM, and functional use. Pt. Tolerated retrograde massage to the right wrist, hand, and digits for edema control. Manual techniques were performed independent of , and in preparation for there. Ex, and IADL functioning.  There. Ex.:  Pt. performed AROM for right shoulder flexion, and abduction with PROM to the end range. Pt. Worked AAROM for shoulder flexion using a 1# dowel weight. With support underneath the right side.   Response to Treatment:  Pt. is making progress overall, and is using her hand more during tasks at home. Pt. has improved with right UE ROM, formulating a fiist, and using her hand for writing tasks using a standard pen. Pt. conitnues to work on improviing edema in the right hand, RUE ROM, strength, and [facility] skills in order to be able to hold items for ADLS, manipulate buttons, and write legibility, and efficiently.  OT Education - 03/21/19 1155    Education Details  RUE functioning, hand exercises, [facility]    Person(s) Educated  Patient    Methods  Explanation;Demonstration    Comprehension  Verbalized understanding;Returned demonstration          OT Long Term Goals - 03/21/19 1158      OT LONG TERM GOAL #1   Title  Patient will complete home exercise program with modified independence.    Baseline  addition of new exercises ongoing as patient progresses with RUE.    Time  12    Period  Weeks    Status  On-going    Target Date  04/28/19      OT LONG TERM GOAL #3   Title  Patient will demonstrate improved grip by 10 # in right Hand to be  able to hold items in hand with gross grasp.    Baseline  Pt. is able to initate holding items    Time  6    Period  Weeks    Status  On-going    Target Date  04/28/19      OT LONG TERM GOAL #5   Title  Patient will improve right hand function to be able to grasp pen to sign name on important papers with good legibility.    Baseline  Pt. is able to hold and use a standard pen for printing her name, however has difficulty signing her name. Pt. with 75% legibility for printed form with positive deviation above the line. Requiring increased time for letter formation.    Time  12    Period  Weeks    Status  On-going    Target Date  04/28/19      OT LONG TERM GOAL #6   Time  12      OT LONG TERM GOAL #7   Title  Patient will improve right hand function to manipulate buttons, snaps, zippers with modified independence.    Baseline  Pt. continues to be unable to manipulate buttons.    Time  12    Period  Weeks    Status  On-going    Target Date  04/28/19      OT LONG TERM GOAL #8   Title  Patient will demonstrate the ability to stir items in a bowl of varying consistencies with modified independence.    Baseline  Pt. continues to have difficulty    Time  12    Period  Weeks    Status  On-going    Target Date  04/28/19      OT LONG TERM GOAL  #9   TITLE  Patient will demonstrate the ability to move pots and pans from one location to another in the kitchen with modified independence.    Baseline  unable    Time  12    Period  Weeks    Status  On-going    Target Date  04/28/19      OT LONG TERM GOAL  #10   TITLE  Patient will improve right shoulder flexion by 10 degrees to assist with reaching and placing objects on table.    Baseline  Pt. is improving with right shoulder ROM. Pt. continues to have difficulty placing items on the table.    Time  12    Period  Weeks    Status  On-going    Target Date  04/28/19            Plan -  03/21/19 1157    Clinical Impression  Statement  Pt. is making progress overall, and is using her hand more during tasks at home. Pt. has improved with right UE ROM, formulating a fiist, and using her hand for writing tasks using a standard pen. Pt. conitnues to work on improviing edema in the right hand, RUE ROM, strength, and [facility] skills in order to be able to hold items for ADLS, manipulate buttons, and write legibility, and efficiently.    OT Occupational Profile and History  Detailed Assessment- Review of Records and additional review of physical, cognitive, psychosocial history related to current functional performance    Occupational performance deficits (Please refer to evaluation for details):  ADL's;IADL's;Work;Leisure    Body Structure / Function / Physical Skills  ADL;Coordination;GMC;UE functional use;Balance;Decreased knowledge of use of DME;Flexibility;IADL;Dexterity;FMC;Proprioception;Strength;Edema;ROM    Cognitive Skills  Memory    Psychosocial Skills  Environmental  Adaptations;Habits;Routines and Behaviors    Rehab Potential  Good    Clinical Decision Making  Limited treatment options, no task modification necessary    Comorbidities Affecting Occupational Performance:  May have comorbidities impacting occupational performance    Modification or Assistance to Complete Evaluation   No modification of tasks or assist necessary to complete eval    OT Frequency  2x / week    OT Duration  12 weeks    OT Treatment/Interventions  Self-care/ADL training;Cryotherapy;Therapeutic exercise;DME and/or AE instruction;Functional Mobility Training;Cognitive remediation/compensation;Balance training;Electrical Stimulation;Neuromuscular education;Manual Therapy;Splinting;Moist Heat;Contrast Bath;Therapeutic activities;Patient/family education;Passive range of motion    Consulted and Agree with Plan of Care  Patient       Patient will benefit from skilled therapeutic intervention in order to improve the following deficits and  impairments:   Body Structure / Function / Physical Skills: ADL, Coordination, GMC, UE functional use, Balance, Decreased knowledge of use of DME, Flexibility, IADL, Dexterity, FMC, Proprioception, Strength, Edema, ROM Cognitive Skills: Memory Psychosocial Skills: Environmental  Adaptations, Habits, Routines and Behaviors   Visit Diagnosis: Muscle weakness (generalized)  Other lack of coordination    Problem List Patient Active Problem List   Diagnosis Date Noted  . Mixed hyperlipidemia 09/28/2018  . Middle cerebral artery embolism, left 04/18/2018  . History of stroke 04/17/2018  . IFG (impaired fasting glucose) 03/28/2018  . Essential hypertension, benign 01/15/2017  . Asthma 01/15/2017    Olegario Messier, MS, OTR/L 03/21/2019, 12:28 PM  [place] [facility] MAIN [facility] SERVICES [address] [place], [place], [address] Phone: [phone]   Fax:  [phone]  Name: YANIL DAWE MRN: [id number] Date of Birth: [birth year]

## 2019-03-21 NOTE — Therapy (Signed)
[place] MAIN [facility] SERVICES [address] [place], [place], [address] Phone: [phone]   Fax:  [phone]  Physical Therapy Treatment  Patient Details  Name: Kristin Solis MRN: [id number] Date of Birth: 10-14-1955 Referring Provider (PT): [name]   Encounter Date: 03/21/2019  PT End of Session - 03/21/19 1154    Visit Number  8    Number of Visits  25    Date for PT Re-Evaluation  03/07/19    Authorization Type  eval 10/27    Authorization Time Period  approved 3 visist 11/4-11/13, 8 visits aproved ( 01/02/19-12/13)    Authorization - Visit Number  3    Authorization - Number of Visits  12    PT Start Time  0626    PT Stop Time  1225    PT Time Calculation (min)  40 min    Equipment Utilized During Treatment  Gait belt    Activity Tolerance  Patient tolerated treatment well;No increased pain;Patient limited by fatigue    Behavior During Therapy  [facility] for tasks assessed/performed       Past Medical History:  Diagnosis Date  . Asthma   . Hypertension   . Stroke [facility])     Past Surgical History:  Procedure Laterality Date  . ABDOMINAL HYSTERECTOMY  2005   total  . IR CT HEAD LTD  04/18/2018  . IR INTRAVSC STENT CERV CAROTID W/O EMB-PROT MOD SED INC ANGIO  04/18/2018  . IR PERCUTANEOUS ART THROMBECTOMY/INFUSION INTRACRANIAL INC DIAG ANGIO  04/18/2018  . OOPHORECTOMY    . RADIOLOGY WITH ANESTHESIA N/A 04/17/2018   Procedure: RADIOLOGY WITH ANESTHESIA;  Surgeon: Luanne Bras, MD;  Location: [place];  Service: Radiology;  Laterality: N/A;    There were no vitals filed for this visit.  Subjective Assessment - 03/21/19 1153    Subjective  Patient reported compliance with HEP,  no changes to medications, falls, or stumbles    Patient is accompained by:  Family member    Pertinent History  Patient is a 64 y.o. female with history of stroke receiving care previously from [facility]. She was initially admitted  to ED on 04/17/18 with dysphagia and right sided weakness s/p left MCA CVA, extubated on 04/19/18 and discharged on 04/23/18 to private residence. Was independent with ADLs/IADLs prior to admission. Inpatient PT reported decreased safety awareness and awareness of deficits, with deficits in the areas of balance, ambulation ability, transfers. Was able to ambulate 125 feet in hospital with min guard without AD, and independent with bed mobility.    Limitations  Lifting;Standing;Walking;Writing;House hold activities    How long can you sit comfortably?  As long as desired    How long can you stand comfortably?  5 minutes    How long can you walk comfortably?  Able to grocery shop, requires a seated rest break for a couple of minutes.    Patient Stated Goals  Wants to be able to use right arm and hand, and to feel more confortable with going shopping with her husband.    Currently in Pain?  No/denies    Pain Score  0-No pain    Pain Onset  Today       Ther-ex  Nu-step x 5 mins  Quantum leg press 40 #, single leg  x 15 x 2 sets , BLE Leg press 40 lbs x 15 x 2 Leg press heel raises x 15 x 2 w/ 40 lbs  TM x 6 mins, 3 mins . 5 miles / hour   Patient needs occasional verbal cueing to improve posture and cueing to correctly perform exercises                       PT Education - 03/21/19 1153    Education Details  HEP    Person(s) Educated  Patient    Methods  Explanation    Comprehension  Verbalized understanding;Returned demonstration       PT Short Term Goals - 01/18/19 1408      PT SHORT TERM GOAL #1   Title  Patient will be independent in initial home exercise program to improve strength/mobility for better functional independence with ADLs.    Baseline  Not made yet., 12/28/18: given a HEP but still needs cues for proper technique    Time  6    Period  Weeks    Status  Partially Met    Target Date  01/24/19      PT SHORT TERM GOAL #2   Title  Patient (> 43 years old)  will complete five times sit to stand test in < 15 seconds indicating an increased LE strength and improved balance for decreased falls risk.    Baseline  12/13/18: 17.52 sec; 12/28/18: 13.53 sec without HHA    Time  6    Period  Weeks    Status  Achieved    Target Date  01/24/19        PT Long Term Goals - 01/18/19 1408      PT LONG TERM GOAL #1   Title  Patient will increase 10 meter walk test to >1.39ms as to improve gait speed for better community ambulation and to reduce fall risk.    Baseline  12/13/18: 0.64 m/s with rollator, 0.72 m/s without AD; 12/28/18: 0.84 m/s without AD 11/25: 1.0 m/s without AD    Time  12    Period  Weeks    Status  Achieved      PT LONG TERM GOAL #2   Title  Patient will increase six minute walk test distance to >1000 for progression to community ambulator and improve gait ability.    Baseline  12/13/18: 655ft with rollator; 12/28/18: deferred 11/25: 723 ft without an AD, 01/18/19 640    Time  12    Period  Weeks    Status  Partially Met    Target Date  03/07/19      PT LONG TERM GOAL #3   Title  Patient will be require no assist with ascend/descend 3 steps using Least restrictive assistive device without LOB to improve ability to get in/out of home.    Baseline  Dependent on rail assist to reach front door. 12/28/18: uses rail, one step at a time 11/25: able to ascend/descend step over step with BUE support, able to perform step to pattern with SUE support12/2/20=able to perform step to pattern with SUE support12    Time  12    Period  Weeks    Status  Partially Met    Target Date  01/18/19      PT LONG TERM GOAL #4   Title  Patient will increase BLE gross strength to 4+/5 as to improve functional strength for independent gait, increased standing tolerance and increased ADL ability.    Baseline  12/13/18: RLE hip weakness; 12/28/18: RLE hip extension: 2+/5, hip abduction/adduction 3-/5, knee grossly 4-/5 11/25: RLE hip extension  3-/5 add/abd 3/5  knee 4/512/2/20=RLE hip extension 3-/5 add/abd 3/5 knee 4/5    Time  12    Period  Weeks    Status  Partially Met      PT LONG TERM GOAL #5   Title  Patient will increase Functional Gait Assessment score to >20/30 as to reduce fall risk and improve dynamic gait safety with community ambulation.    Baseline  12/28/18: 13/24, 01/18/19 13/30    Time  12    Period  Weeks    Status  New    Target Date  03/06/18            Plan - 03/21/19 1200    Clinical Impression Statement   Pt was able to perform all exercises today with CGA.[name] Pt was able to perform all balance and strength exercises, demonstrating improvements in LE strength and stability.  Pt was able to complete dynamic balance exercises, showing ability to stand on even surfaces with min assist and improve postural reactions to correct self during activities.  Pt requires verbal, visual and tactile cues during exercise in order to complete tasks with proper form and technique, as well as to stay on task.  Pt would continue to benefit from skilled PT services in order to further strengthen LE's, improve static and dynamic balance, and improve coordination in order to increase functional mobility and decrease risk of falls   Personal Factors and Comorbidities  Age;Comorbidity 2;Fitness;Time since onset of injury/illness/exacerbation    Comorbidities  HTN, Hx of stroke    Examination-Activity Limitations  Bathing;Bed Mobility;Bend;Carry;Dressing;Lift;Locomotion Level;Reach Overhead;Squat;Stairs;Stand;Transfers    Examination-Participation Restrictions  Cleaning;Community Activity;Driving;Laundry;Shop;Yard Work    Stability/Clinical Decision Making  Stable/Uncomplicated    Rehab Potential  Good    PT Frequency  2x / week    PT Duration  12 weeks    PT Treatment/Interventions  ADLs/Self Care Home Management;Aquatic Therapy;Cryotherapy;Electrical Stimulation;Moist Heat;DME Instruction;Gait training;Stair training;Functional mobility  training;Therapeutic activities;Therapeutic exercise;Balance training;Neuromuscular re-education;Patient/family education;Orthotic Fit/Training;Manual techniques;Compression bandaging;Passive range of motion;Dry needling;Energy conservation;Taping    PT Next Visit Plan  review HEP, balance    PT Home Exercise Plan  No updates this date    Consulted and Agree with Plan of Care  Patient       Patient will benefit from skilled therapeutic intervention in order to improve the following deficits and impairments:  Abnormal gait, Decreased balance, Decreased endurance, Decreased mobility, Difficulty walking, Hypomobility, Decreased range of motion, Improper body mechanics, Decreased activity tolerance, Decreased strength, Impaired UE functional use, Postural dysfunction  Visit Diagnosis: Muscle weakness (generalized)  Other lack of coordination  Hemiplegia and hemiparesis following cerebral infarction affecting right dominant side (HCC)  History of stroke  Unsteadiness on feet  Other abnormalities of gait and mobility  Localized edema     Problem List Patient Active Problem List   Diagnosis Date Noted  . Mixed hyperlipidemia 09/28/2018  . Middle cerebral artery embolism, left 04/18/2018  . History of stroke 04/17/2018  . IFG (impaired fasting glucose) 03/28/2018  . Essential hypertension, benign 01/15/2017  . Asthma 01/15/2017    Alanson Puls, PT DPT 03/21/2019, 12:02 PM  [place] MAIN [facility] SERVICES [address] [place], [place], [address] Phone: [phone]   Fax:  [phone]  Name: TEGAN BRITAIN MRN: [id number] Date of Birth: [birth year]

## 2019-03-28 ENCOUNTER — Telehealth: Payer: Self-pay

## 2019-03-29 ENCOUNTER — Other Ambulatory Visit: Payer: Self-pay

## 2019-03-29 ENCOUNTER — Ambulatory Visit: Payer: Medicaid Other | Admitting: Occupational Therapy

## 2019-03-29 ENCOUNTER — Encounter: Payer: Self-pay | Admitting: Occupational Therapy

## 2019-03-29 ENCOUNTER — Ambulatory Visit: Payer: Medicaid Other | Admitting: Physical Therapy

## 2019-03-29 DIAGNOSIS — Z8673 Personal history of transient ischemic attack (TIA), and cerebral infarction without residual deficits: Secondary | ICD-10-CM | POA: Diagnosis not present

## 2019-03-29 DIAGNOSIS — M6281 Muscle weakness (generalized): Secondary | ICD-10-CM

## 2019-03-29 DIAGNOSIS — R278 Other lack of coordination: Secondary | ICD-10-CM | POA: Diagnosis not present

## 2019-03-29 DIAGNOSIS — I69351 Hemiplegia and hemiparesis following cerebral infarction affecting right dominant side: Secondary | ICD-10-CM

## 2019-03-29 DIAGNOSIS — R600 Localized edema: Secondary | ICD-10-CM | POA: Diagnosis not present

## 2019-03-29 DIAGNOSIS — R2689 Other abnormalities of gait and mobility: Secondary | ICD-10-CM

## 2019-03-29 DIAGNOSIS — R2681 Unsteadiness on feet: Secondary | ICD-10-CM | POA: Diagnosis not present

## 2019-03-29 NOTE — Therapy (Signed)
[place] [facility] MAIN [facility] SERVICES [address] [place], [place], [address] Phone: [phone]   Fax:  [phone]  Occupational Therapy Treatment  Patient Details  Name: Kristin Solis MRN: [id number] Date of Birth: 09-28-1955 Referring Provider (OT): Aura Dials   Encounter Date: 03/29/2019  OT End of Session - 03/29/19 1151    Visit Number  21    Number of Visits  37    Date for OT Re-Evaluation  04/28/18    Authorization Type  Progress report period strating 01/26/2019    OT Start Time  1147    OT Stop Time  1230    OT Time Calculation (min)  43 min    Activity Tolerance  Patient tolerated treatment well    Behavior During Therapy  [facility] for tasks assessed/performed       Past Medical History:  Diagnosis Date  . Asthma   . Hypertension   . Stroke [facility])     Past Surgical History:  Procedure Laterality Date  . ABDOMINAL HYSTERECTOMY  2005   total  . IR CT HEAD LTD  04/18/2018  . IR INTRAVSC STENT CERV CAROTID W/O EMB-PROT MOD SED INC ANGIO  04/18/2018  . IR PERCUTANEOUS ART THROMBECTOMY/INFUSION INTRACRANIAL INC DIAG ANGIO  04/18/2018  . OOPHORECTOMY    . RADIOLOGY WITH ANESTHESIA N/A 04/17/2018   Procedure: RADIOLOGY WITH ANESTHESIA;  Surgeon: Julieanne Cotton, MD;  Location: [facility] OR;  Service: Radiology;  Laterality: N/A;    There were no vitals filed for this visit.  Subjective Assessment - 03/29/19 1150    Subjective   Pt. reports being tired after PT.    Patient is accompanied by:  Family member    Pertinent History  Patient is a 64 y.o. female with history of stroke,  admitted to ED on 04/17/18 with dysphagia and right sided weakness s/p left MCA CVA, extubated on 04/19/18 and discharged on 04/23/18 to private residence    Currently in Pain?  No/denies      OT TREATMENT    Neuro muscular re-education:  Pt. performed FMC tasks using the grooved pegboard. Pt. worked on grasping the grooved pegs from a horizontal position, and  moving the pegs to a vertical position in the hand to prepare for placing them in the grooved slot. Pt. Worked on removing the grooved pegs alternating thumb opposition to the tip of her 2nd through 5th digits.  Therapeutic Exercise:  Pt. performed right hand gross gripping with grip strengthener. Pt. worked on sustaining grip while grasping pegs and reaching at various heights. The gripper was positioned with 6.6# of grip strength resistance. Pt. worked on pinch strengthening in the right  hand for lateral, and 3pt. pinch using yellow, and red resistive clips. Pt. worked on placing the clips at various vertical and horizontal angles. Tactile and verbal cues were required for eliciting the desired movement.  Response to Treatment:  Pt. is making steady progress. Pt. is now able to use her hand to open, and close the truck door. Pt. continues to present with limited right UE ROM, right hand grip strength, and [facility] skills. Pt. requires assist from the left hand to reposiiton the gripper upright vertically. Pt. Required verbal cues to avoid compensating proximally by leaning with her trunk when performing strengthening, and [facility] skills with her right hand. Pt. continues to work on improving RUE strength, and [facility] skills in order to work towards improving, and maximizing function with ADLs, and IADL tasks.  OT Education - 03/29/19 1151    Education Details  RUE functioning, hand exercises, [employer] [facility]    Person(s) Educated  Patient    Methods  Explanation;Demonstration    Comprehension  Verbalized understanding;Returned demonstration          OT Long Term Goals - 03/21/19 1158      OT LONG TERM GOAL #1   Title  Patient will complete home exercise program with modified independence.    Baseline  addition of new exercises ongoing as patient progresses with RUE.    Time  12    Period  Weeks    Status  On-going    Target Date  04/28/19      OT LONG TERM GOAL #3    Title  Patient will demonstrate improved grip by 10 # in right Hand to be able to hold items in hand with gross grasp.    Baseline  Pt. is able to initate holding items    Time  6    Period  Weeks    Status  On-going    Target Date  04/28/19      OT LONG TERM GOAL #5   Title  Patient will improve right hand function to be able to grasp pen to sign name on important papers with good legibility.    Baseline  Pt. is able to hold and use a standard pen for printing her name, however has difficulty signing her name. Pt. with 75% legibility for printed form with positive deviation above the line. Requiring increased time for letter formation.    Time  12    Period  Weeks    Status  On-going    Target Date  04/28/19      OT LONG TERM GOAL #6   Time  12      OT LONG TERM GOAL #7   Title  Patient will improve right hand function to manipulate buttons, snaps, zippers with modified independence.    Baseline  Pt. continues to be unable to manipulate buttons.    Time  12    Period  Weeks    Status  On-going    Target Date  04/28/19      OT LONG TERM GOAL #8   Title  Patient will demonstrate the ability to stir items in a bowl of varying consistencies with modified independence.    Baseline  Pt. continues to have difficulty    Time  12    Period  Weeks    Status  On-going    Target Date  04/28/19      OT LONG TERM GOAL  #9   TITLE  Patient will demonstrate the ability to move pots and pans from one location to another in the kitchen with modified independence.    Baseline  unable    Time  12    Period  Weeks    Status  On-going    Target Date  04/28/19      OT LONG TERM GOAL  #10   TITLE  Patient will improve right shoulder flexion by 10 degrees to assist with reaching and placing objects on table.    Baseline  Pt. is improving with right shoulder ROM. Pt. continues to have difficulty placing items on the table.    Time  12    Period  Weeks    Status  On-going    Target Date   04/28/19  Plan - 03/29/19 1152    Clinical Impression Statement Pt. is making steady progress. Pt. is now able to use her hand to open, and close the truck door. Pt. continues to present with limited right UE ROM, right hand grip strength, and [facility] skills. Pt. requires assist from the left hand to reposiiton the gripper upright vertically. Pt. Required verbal cues to avoid compensating proximally by leaning with her trunk when performing strengthening, and [facility] skills with her right hand. Pt. continues to work on improving RUE strength, and [facility] skills in order to work towards improving, and maximizing function with ADLs, and IADL tasks.     OT Occupational Profile and History  Detailed Assessment- Review of Records and additional review of physical, cognitive, psychosocial history related to current functional performance    Occupational performance deficits (Please refer to evaluation for details):  ADL's;IADL's;Work;Leisure    Body Structure / Function / Physical Skills  ADL;Coordination;GMC;UE functional use;Balance;Decreased knowledge of use of DME;Flexibility;IADL;Dexterity;FMC;Proprioception;Strength;Edema;ROM    Cognitive Skills  Memory    Psychosocial Skills  Environmental  Adaptations;Habits;Routines and Behaviors    Rehab Potential  Good    Clinical Decision Making  Limited treatment options, no task modification necessary    Comorbidities Affecting Occupational Performance:  May have comorbidities impacting occupational performance    Modification or Assistance to Complete Evaluation   No modification of tasks or assist necessary to complete eval    OT Frequency  2x / week    OT Duration  12 weeks    OT Treatment/Interventions  Self-care/ADL training;Cryotherapy;Therapeutic exercise;DME and/or AE instruction;Functional Mobility Training;Cognitive remediation/compensation;Balance training;Electrical Stimulation;Neuromuscular education;Manual Therapy;Splinting;Moist  Heat;Contrast Bath;Therapeutic activities;Patient/family education;Passive range of motion    Consulted and Agree with Plan of Care  Patient       Patient will benefit from skilled therapeutic intervention in order to improve the following deficits and impairments:   Body Structure / Function / Physical Skills: ADL, Coordination, GMC, UE functional use, Balance, Decreased knowledge of use of DME, Flexibility, IADL, Dexterity, FMC, Proprioception, Strength, Edema, ROM Cognitive Skills: Memory Psychosocial Skills: Environmental  Adaptations, Habits, Routines and Behaviors   Visit Diagnosis: Muscle weakness (generalized)  Other lack of coordination    Problem List Patient Active Problem List   Diagnosis Date Noted  . Mixed hyperlipidemia 09/28/2018  . Middle cerebral artery embolism, left 04/18/2018  . History of stroke 04/17/2018  . IFG (impaired fasting glucose) 03/28/2018  . Essential hypertension, benign 01/15/2017  . Asthma 01/15/2017    Harrel Carina, MS, OTR/L 03/29/2019, 12:15 PM  [place] MAIN [facility] SERVICES [address] [place], [place], [address] Phone: [phone]   Fax:  [phone]  Name: ALBINA GOSNEY MRN: [id number] Date of Birth: 05-27-1955

## 2019-03-29 NOTE — Addendum Note (Signed)
Addended by: Ezekiel Ina on: 03/29/2019 02:36 PM   Modules accepted: Orders

## 2019-03-29 NOTE — Therapy (Addendum)
[place] MAIN [facility] SERVICES [address] [place], [place], [address] Phone: [phone]   Fax:  [phone]  Physical Therapy Treatment  Patient Details  Name: Kristin Solis MRN: [id number] Date of Birth: 11-20-1955 Referring Provider (PT): [name]   Encounter Date: 03/29/2019  PT End of Session - 03/29/19 1104    Visit Number  9    Number of Visits  25    Date for PT Re-Evaluation  03/07/19    Authorization Type  eval 10/27    Authorization Time Period  approved 3 visist 11/4-11/13, 8 visits aproved ( 01/02/19-12/13)    Authorization - Visit Number  3    Authorization - Number of Visits  12    PT Start Time  1100    PT Stop Time  1145    PT Time Calculation (min)  45 min    Equipment Utilized During Treatment  Gait belt    Activity Tolerance  Patient tolerated treatment well;No increased pain;Patient limited by fatigue    Behavior During Therapy  [facility] for tasks assessed/performed       Past Medical History:  Diagnosis Date  . Asthma   . Hypertension   . Stroke [facility])     Past Surgical History:  Procedure Laterality Date  . ABDOMINAL HYSTERECTOMY  2005   total  . IR CT HEAD LTD  04/18/2018  . IR INTRAVSC STENT CERV CAROTID W/O EMB-PROT MOD SED INC ANGIO  04/18/2018  . IR PERCUTANEOUS ART THROMBECTOMY/INFUSION INTRACRANIAL INC DIAG ANGIO  04/18/2018  . OOPHORECTOMY    . RADIOLOGY WITH ANESTHESIA N/A 04/17/2018   Procedure: RADIOLOGY WITH ANESTHESIA;  Surgeon: Luanne Bras, MD;  Location: [facility];  Service: Radiology;  Laterality: N/A;    There were no vitals filed for this visit.  Subjective Assessment - 03/29/19 1103    Subjective  Patient reported compliance with HEP,  no changes to medications, falls, or stumbles    Patient is accompained by:  Family member    Pertinent History  Patient is a 64 y.o. female with history of stroke receiving care previously from Kristin Solis at [facility] She was initially admitted  to ED on 04/17/18 with dysphagia and right sided weakness s/p left MCA CVA, extubated on 04/19/18 and discharged on 04/23/18 to private residence. Was independent with ADLs/IADLs prior to admission. Inpatient PT reported decreased safety awareness and awareness of deficits, with deficits in the areas of balance, ambulation ability, transfers. Was able to ambulate 125 feet in hospital with min guard without AD, and independent with bed mobility.    Limitations  Lifting;Standing;Walking;Writing;House hold activities    How long can you sit comfortably?  As long as desired    How long can you stand comfortably?  5 minutes    How long can you walk comfortably?  Able to grocery shop, requires a seated rest break for a couple of minutes.    Patient Stated Goals  Wants to be able to use right arm and hand, and to feel more confortable with going shopping with her husband.    Currently in Pain?  No/denies    Pain Score  0-No pain    Pain Onset  Today        Treatment Goals reviewed with progress towards strength, gait and balance indicated by strength improvements in RLE, and less need of railing for steo over step stairs ascending / descending and improved FGA 13/30 to 15/30  Nu-step L 2 x  5 mins TM x 6 mins . 4 miles/ hour Leg press x 20 x 3 , 40 lbs  Leg press heel raises  20 x 2 , 25 lbs hooklying marching x 20 x 2  SLR BLE x 10 x 2  Hip abd x 10 x 2 BLE sidelying hip flex/ ext x 10 x 2 Patient performed with instruction, verbal cues, tactile cues of therapist: goal: increase tissue extensibility, promote proper posture, improve mobility                       PT Education - 03/29/19 1104    Education Details  HEP    Person(s) Educated  Patient    Methods  Explanation    Comprehension  Verbalized understanding;Returned demonstration;Verbal cues required       PT Short Term Goals - 01/18/19 1408      PT SHORT TERM GOAL #1   Title  Patient will be independent in initial  home exercise program to improve strength/mobility for better functional independence with ADLs.    Baseline  Not made yet., 12/28/18: given a HEP but still needs cues for proper technique    Time  6    Period  Weeks    Status  Partially Met    Target Date  01/24/19      PT SHORT TERM GOAL #2   Title  Patient (> 27 years old) will complete five times sit to stand test in < 15 seconds indicating an increased LE strength and improved balance for decreased falls risk.    Baseline  12/13/18: 17.52 sec; 12/28/18: 13.53 sec without HHA    Time  6    Period  Weeks    Status  Achieved    Target Date  01/24/19        PT Long Term Goals - 03/29/19 1323      PT LONG TERM GOAL #1   Title  Patient will increase 10 meter walk test to >1.62ms as to improve gait speed for better community ambulation and to reduce fall risk.    Baseline  12/13/18: 0.64 m/s with rollator, 0.72 m/s without AD; 12/28/18: 0.84 m/s without AD 11/25: 1.0 m/s without AD    Time  12    Period  Weeks    Status  Achieved      PT LONG TERM GOAL #2   Title  Patient will increase six minute walk test distance to >1000 for progression to community ambulator and improve gait ability.    Baseline  12/13/18: 610 ft with rollator; 12/28/18: deferred 11/25: 723 ft without an AD, 01/18/19 640  03/29/19=deferred due to TM walking x 6 mins speed .03 miles / hour    Time  12    Period  Weeks    Status  Partially Met    Target Date  05/02/19      PT LONG TERM GOAL #3   Title  Patient will be require no assist with ascend/descend 3 steps using Least restrictive assistive device without LOB to improve ability to get in/out of home.    Baseline  Dependent on rail assist to reach front door. 12/28/18: uses rail, one step at a time 11/25: able to ascend/descend step over step with BUE support, able to perform step to pattern with SUE support12/2/20=able to perform step to pattern with SUE support12    03/29/19= Able to perform step over step  with less support with one rail support  Time  12    Period  Weeks    Status  Partially Met    Target Date  05/02/19      PT LONG TERM GOAL #4   Title  Patient will increase BLE gross strength to 4+/5 as to improve functional strength for independent gait, increased standing tolerance and increased ADL ability.    Baseline  12/13/18: RLE hip weakness; 12/28/18: RLE hip extension: 2+/5, hip abduction/adduction 3-/5, knee grossly 4-/5 11/25: RLE hip extension 3-/5 add/abd 3/5 knee 4/512/2/20=RLE hip extension 3-/5 add/abd 3/5 knee 4/5   03/29/19= RLE hip flex /abd 3+/5, hip extension 3+/5, R knee 4+/5    Time  12    Period  Weeks    Status  Partially Met    Target Date  05/02/19      PT LONG TERM GOAL #5   Title  Patient will increase Functional Gait Assessment score to >20/30 as to reduce fall risk and improve dynamic gait safety with community ambulation.    Baseline  12/28/18: 13/24, 01/18/19 13/30, 03/29/19 = 15/30    Time  12    Period  Weeks    Status  Partially Met    Target Date  05/02/19            Plan - 03/29/19 1105    Clinical Impression Statement Patient improves goals with FGA, steps and strength and will continue to benefit from skilled PT .  Pt requires direction and verbal cues for correct performance of gait and strengthening exercises. Patient has fatigue with endurance and difficulty with longer standing tasks.  Patient struggles with posture and ability to perform many repetitions due to weakness, as well as balance with mobility.  Pt encouraged continuing HEP   Patient will benefit from continued skilled PT to improve mobility and safety.   Personal Factors and Comorbidities  Age;Comorbidity 2;Fitness;Time since onset of injury/illness/exacerbation    Comorbidities  HTN, Hx of stroke    Examination-Activity Limitations  Bathing;Bed Mobility;Bend;Carry;Dressing;Lift;Locomotion Level;Reach Overhead;Squat;Stairs;Stand;Transfers    Examination-Participation  Restrictions  Cleaning;Community Activity;Driving;Laundry;Shop;Yard Work    Stability/Clinical Decision Making  Stable/Uncomplicated    Rehab Potential  Good    PT Frequency  2x / week    PT Duration  12 weeks    PT Treatment/Interventions  ADLs/Self Care Home Management;Aquatic Therapy;Cryotherapy;Electrical Stimulation;Moist Heat;DME Instruction;Gait training;Stair training;Functional mobility training;Therapeutic activities;Therapeutic exercise;Balance training;Neuromuscular re-education;Patient/family education;Orthotic Fit/Training;Manual techniques;Compression bandaging;Passive range of motion;Dry needling;Energy conservation;Taping    PT Next Visit Plan  review HEP, balance    PT Home Exercise Plan  No updates this date    Consulted and Agree with Plan of Care  Patient       Patient will benefit from skilled therapeutic intervention in order to improve the following deficits and impairments:  Abnormal gait, Decreased balance, Decreased endurance, Decreased mobility, Difficulty walking, Hypomobility, Decreased range of motion, Improper body mechanics, Decreased activity tolerance, Decreased strength, Impaired UE functional use, Postural dysfunction  Visit Diagnosis: Muscle weakness (generalized)  Other lack of coordination  Hemiplegia and hemiparesis following cerebral infarction affecting right dominant side (HCC)  History of stroke  Unsteadiness on feet  Other abnormalities of gait and mobility  Localized edema     Problem List Patient Active Problem List   Diagnosis Date Noted  . Mixed hyperlipidemia 09/28/2018  . Middle cerebral artery embolism, left 04/18/2018  . History of stroke 04/17/2018  . IFG (impaired fasting glucose) 03/28/2018  . Essential hypertension, benign 01/15/2017  . Asthma 01/15/2017  [address] [place] DPT 03/29/2019, 2:33 PM  [place] MAIN [facility] SERVICES [address] [place], [place],  [address] Phone: [phone]   Fax:  [phone]  Name: DENITRA DONAGHEY MRN: [id number] Date of Birth: 05-Feb-1956

## 2019-04-03 ENCOUNTER — Telehealth: Payer: Self-pay

## 2019-04-04 ENCOUNTER — Ambulatory Visit: Payer: Self-pay | Admitting: Pharmacist

## 2019-04-04 DIAGNOSIS — E782 Mixed hyperlipidemia: Secondary | ICD-10-CM

## 2019-04-04 DIAGNOSIS — I10 Essential (primary) hypertension: Secondary | ICD-10-CM

## 2019-04-04 DIAGNOSIS — Z8673 Personal history of transient ischemic attack (TIA), and cerebral infarction without residual deficits: Secondary | ICD-10-CM

## 2019-04-04 NOTE — Patient Instructions (Addendum)
Visit Information  Goals Addressed            This Visit's Progress     Patient Stated   . PharmD "I can't afford my medications" (pt-stated)       Current Barriers:  . Financial Barriers: RESOLVED, patient has [place] Medicaid coverage, though notes she continues to receive medications from [facility] . Polypharmacy; complex patient w/ multiple comordbidities including hx CVA (04/2018), HTN, HLD, asthma . Reports residual deficits in her arm post stroke, but much improved since she has been engaging in weekly PT/OT o ASCVD risk reduction: atorvastatin 40 mg daily; ASA 81 mg daily (completed DAPT w/ Brilinta); LDL at goal <70 o HTN: lisinopril 5 mg daily; BP higher than goal at last visit in October  o Asthma: Advair 250/50 mcg BID, though patient reports she is taking QAM and not needing any rescue albuterol HFA recently o Allergies: fluticasone nasal spray daily   Pharmacist Clinical Goal(s):  [name] Over the next 90 days, patient will work with PharmD and providers to relieve medication access concerns  Interventions: . Comprehensive medication management review performed, medication list updated in electronic medical record . Reviewed that now that she is insured, she should no longer be receiving medications for free through [facility]. She has follow up with PCP next week. Recommend that all refills be sent to a local pharmacy (eg [facility]) at this time.  . Reviewed CCM team roles. She is interested in follow up from RN CM and LCSW, though denies any specific complaints or needs at this time. Will update team members.  . Monitor BP at upcoming PCP appointment. Consider increase in lisinopril dose if needed  Patient Self Care Activities:  . Patient will take medications as prescribed  Please see past updates related to this goal by clicking on the "Past Updates" button in the selected goal         The patient verbalized understanding of instructions provided today and declined a print copy of  patient instruction materials.    Plan:  - Scheduled f/u call 06/06/19  Catie Feliz Beam, PharmD, [facility]  Clinical Pharmacist  [facility] Practice/[facility]  [phone]

## 2019-04-04 NOTE — Chronic Care Management (AMB) (Signed)
Chronic Care Management   Follow Up Note   04/04/2019 Name: Kristin Solis MRN: [id number] DOB: 07-19-1955  Referred by: [name], NP Reason for referral : Chronic Care Management (Medication Management)   Kristin Solis is a 64 y.o. year old female who is a primary care patient of [name], [name], NP. The CCM team was consulted for assistance with chronic disease management and care coordination needs.    Contacted patient for medication management review today.   Review of patient status, including review of consultants reports, relevant laboratory and other test results, and collaboration with appropriate care team members and the patient's provider was performed as part of comprehensive patient evaluation and provision of chronic care management services.    SDOH (Social Determinants of Health) screening performed today: Financial Strain . See Care Plan for related entries.   Outpatient Encounter Medications as of 04/04/2019  Medication Sig Note  . albuterol (VENTOLIN HFA) 108 (90 Base) MCG/ACT inhaler Inhale 2 puffs into the lungs every 6 (six) hours as needed for wheezing or shortness of breath. 04/04/2019: PRN; hasn't needed in a while   . aspirin EC 81 MG EC tablet Take 1 tablet (81 mg total) by mouth daily.   [name] atorvastatin (LIPITOR) 40 MG tablet Take 1 tablet (40 mg total) by mouth daily at 6 PM.   . fluticasone (FLONASE) 50 MCG/ACT nasal spray Place 2 sprays into both nostrils daily.   [name] lisinopril (ZESTRIL) 5 MG tablet Take 1 tablet (5 mg total) by mouth daily.   . Fluticasone-Salmeterol (ADVAIR DISKUS) 250-50 MCG/DOSE AEPB Inhale 1 puff into the lungs 2 (two) times daily. (Patient not taking: Reported on 04/04/2019) 04/04/2019: QAM    No facility-administered encounter medications on file as of 04/04/2019.     Objective:   Goals Addressed            This Visit's Progress     Patient Stated   . PharmD "I can't afford my medications" (pt-stated)      Current Barriers:  . Financial Barriers: RESOLVED, patient has [place] Medicaid coverage, though notes she continues to receive medications from [facility] . Polypharmacy; complex patient w/ multiple comordbidities including hx CVA (04/2018), HTN, HLD, asthma . Reports residual deficits in her arm post stroke, but much improved since she has been engaging in weekly PT/OT o ASCVD risk reduction: atorvastatin 40 mg daily; ASA 81 mg daily (completed DAPT w/ Brilinta) o HTN: lisinopril 5 mg daily o Asthma: Advair 250/50 mcg BID, though patient reports she is taking QAM and not needing any rescue albuterol HFA recently o Allergies: fluticasone nasal spray daily   Pharmacist Clinical Goal(s):  [name] Over the next 90 days, patient will work with PharmD and providers to relieve medication access concerns  Interventions: . Comprehensive medication management review performed, medication list updated in electronic medical record . Reviewed that now that she is insured, she should no longer be receiving medications for free through [facility]. She has follow up with PCP next week. Recommend that all refills be sent to a local pharmacy (eg [facility]) at this time.  . Reviewed CCM team roles. She is interested in follow up from RN CM and LCSW, though denies any specific complaints or needs at this time. Will update team members.   Patient Self Care Activities:  . Patient will take medications as prescribed  Please see past updates related to this goal by clicking on the "Past Updates" button in the selected goal  Plan:  - Scheduled f/u call 06/06/19  Catie Feliz Beam, PharmD, [facility] Clinical Pharmacist [facility] Practice/[facility] [phone]

## 2019-04-05 ENCOUNTER — Encounter: Payer: Self-pay | Admitting: Physical Therapy

## 2019-04-05 ENCOUNTER — Ambulatory Visit: Payer: Medicaid Other | Admitting: Occupational Therapy

## 2019-04-05 ENCOUNTER — Ambulatory Visit: Payer: Medicaid Other

## 2019-04-05 ENCOUNTER — Encounter: Payer: Self-pay | Admitting: Occupational Therapy

## 2019-04-05 ENCOUNTER — Other Ambulatory Visit: Payer: Self-pay

## 2019-04-05 DIAGNOSIS — Z8673 Personal history of transient ischemic attack (TIA), and cerebral infarction without residual deficits: Secondary | ICD-10-CM | POA: Diagnosis not present

## 2019-04-05 DIAGNOSIS — R2689 Other abnormalities of gait and mobility: Secondary | ICD-10-CM

## 2019-04-05 DIAGNOSIS — R2681 Unsteadiness on feet: Secondary | ICD-10-CM | POA: Diagnosis not present

## 2019-04-05 DIAGNOSIS — M6281 Muscle weakness (generalized): Secondary | ICD-10-CM

## 2019-04-05 DIAGNOSIS — R278 Other lack of coordination: Secondary | ICD-10-CM

## 2019-04-05 DIAGNOSIS — R600 Localized edema: Secondary | ICD-10-CM | POA: Diagnosis not present

## 2019-04-05 DIAGNOSIS — I69351 Hemiplegia and hemiparesis following cerebral infarction affecting right dominant side: Secondary | ICD-10-CM | POA: Diagnosis not present

## 2019-04-05 NOTE — Therapy (Signed)
[facility] MAIN [facility] SERVICES [address] [place], [place], [address] Phone: [phone]   Fax:  [phone]  Occupational Therapy Treatment  Patient Details  Name: Kristin Solis MRN: [id number] Date of Birth: 1955/10/07 Referring Provider (OT): Aura Dials   Encounter Date: 04/05/2019  OT End of Session - 04/05/19 1223    Visit Number  22    Number of Visits  37    Date for OT Re-Evaluation  04/28/18    Authorization Type  Progress report period strating 01/26/2019    OT Start Time  1145    OT Stop Time  1230    OT Time Calculation (min)  45 min    Activity Tolerance  Patient tolerated treatment well    Behavior During Therapy  [facility] for tasks assessed/performed       Past Medical History:  Diagnosis Date  . Asthma   . Hypertension   . Stroke [facility])     Past Surgical History:  Procedure Laterality Date  . ABDOMINAL HYSTERECTOMY  2005   total  . IR CT HEAD LTD  04/18/2018  . IR INTRAVSC STENT CERV CAROTID W/O EMB-PROT MOD SED INC ANGIO  04/18/2018  . IR PERCUTANEOUS ART THROMBECTOMY/INFUSION INTRACRANIAL INC DIAG ANGIO  04/18/2018  . OOPHORECTOMY    . RADIOLOGY WITH ANESTHESIA N/A 04/17/2018   Procedure: RADIOLOGY WITH ANESTHESIA;  Surgeon: Julieanne Cotton, MD;  Location: [facility] OR;  Service: Radiology;  Laterality: N/A;    There were no vitals filed for this visit.  Subjective Assessment - 04/05/19 1223    Subjective   Pt. reports losing power for 38 hours of the weekend.    Patient is accompanied by:  Family member    Pertinent History  Patient is a 64 y.o. female with history of stroke,  admitted to ED on 04/17/18 with dysphagia and right sided weakness s/p left MCA CVA, extubated on 04/19/18 and discharged on 04/23/18 to private residence    Patient Stated Goals  Patient reports she would like to get her right hand to move more and be able to use for daily tasks.    Currently in Pain?  No/denies      OT TREATMENT    Neuro  muscular re-education:  Pt. worked on grasping small flat from the tabletop, and worked on holding them in her hand. Pt. stacked them 5 at at time on the tabletop from the palm of her hand. Pt. Had difficulty with translatory skills moving the coins one at a time from the palm of her hand to the tip of her fingers to stack them on the table.   Therapeutic Exercise:  Pt. Performed AROM stretches for MP, PIP, DIP, flexion, and extension with PROM to the end range. Pt. performed gross gripping with grip strengthener. Pt. worked on sustaining grip while grasping pegs and reaching at various heights. The gripper was positioned at 6.6# of resistive force. Pt. Worked on pinch strengthening in the left hand for lateral, and 3pt. pinch using yellow, and red resistive clips. Pt. worked on placing the clips at various vertical and horizontal angles. Tactile and verbal cues were required for eliciting the desired movement.   Response to Treatment:   Pt. continues to make steady progress with her RUE, and hand. Pt. continues to be able to use her right hand to open, and close the truck door. Pt. is improving with gross grip strengthening, and [facility] skills. Right hand edema is improving. Pt. continues to  compensate proximally when using her RUE, and hand. Pt. continues to work on improving RUE grip strength, pinch strength, hand function, and [place] skills during ADLs, and IADL tasks.                     OT Education - 04/05/19 1223    Education Details  RUE functioning, hand exercises, [facility]    Person(s) Educated  Patient    Methods  Explanation;Demonstration    Comprehension  Verbalized understanding;Returned demonstration          OT Long Term Goals - 03/21/19 1158      OT LONG TERM GOAL #1   Title  Patient will complete home exercise program with modified independence.    Baseline  addition of new exercises ongoing as patient progresses with RUE.    Time  12    Period  Weeks     Status  On-going    Target Date  04/28/19      OT LONG TERM GOAL #3   Title  Patient will demonstrate improved grip by 10 # in right Hand to be able to hold items in hand with gross grasp.    Baseline  Pt. is able to initate holding items    Time  6    Period  Weeks    Status  On-going    Target Date  04/28/19      OT LONG TERM GOAL #5   Title  Patient will improve right hand function to be able to grasp pen to sign name on important papers with good legibility.    Baseline  Pt. is able to hold and use a standard pen for printing her name, however has difficulty signing her name. Pt. with 75% legibility for printed form with positive deviation above the line. Requiring increased time for letter formation.    Time  12    Period  Weeks    Status  On-going    Target Date  04/28/19      OT LONG TERM GOAL #6   Time  12      OT LONG TERM GOAL #7   Title  Patient will improve right hand function to manipulate buttons, snaps, zippers with modified independence.    Baseline  Pt. continues to be unable to manipulate buttons.    Time  12    Period  Weeks    Status  On-going    Target Date  04/28/19      OT LONG TERM GOAL #8   Title  Patient will demonstrate the ability to stir items in a bowl of varying consistencies with modified independence.    Baseline  Pt. continues to have difficulty    Time  12    Period  Weeks    Status  On-going    Target Date  04/28/19      OT LONG TERM GOAL  #9   TITLE  Patient will demonstrate the ability to move pots and pans from one location to another in the kitchen with modified independence.    Baseline  unable    Time  12    Period  Weeks    Status  On-going    Target Date  04/28/19      OT LONG TERM GOAL  #10   TITLE  Patient will improve right shoulder flexion by 10 degrees to assist with reaching and placing objects on table.    Baseline  Pt. is improving with right  shoulder ROM. Pt. continues to have difficulty placing items on the table.     Time  12    Period  Weeks    Status  On-going    Target Date  04/28/19            Plan - 04/05/19 1225    Clinical Impression Statement Pt. continues to make steady progress with her RUE, and hand. Pt. continues to be able to use her right hand to open, and close the truck door. Pt. is improving with gross grip strengthening, and [facility] skills. Right hand edema is improving. Pt. continues to compensate proximally when using her RUE, and hand. Pt. continues to work on improving RUE grip strength, pinch strength, hand function, and [facility] skills during ADLs, and IADL tasks.    OT Occupational Profile and History  Detailed Assessment- Review of Records and additional review of physical, cognitive, psychosocial history related to current functional performance    Occupational performance deficits (Please refer to evaluation for details):  ADL's;IADL's;Work;Leisure    Body Structure / Function / Physical Skills  ADL;Coordination;GMC;UE functional use;Balance;Decreased knowledge of use of DME;Flexibility;IADL;Dexterity;FMC;Proprioception;Strength;Edema;ROM    Psychosocial Skills  Environmental  Adaptations;Habits;Routines and Behaviors    Rehab Potential  Good    Clinical Decision Making  Limited treatment options, no task modification necessary    Comorbidities Affecting Occupational Performance:  May have comorbidities impacting occupational performance    Modification or Assistance to Complete Evaluation   No modification of tasks or assist necessary to complete eval    OT Frequency  2x / week    OT Duration  12 weeks    OT Treatment/Interventions  Self-care/ADL training;Cryotherapy;Therapeutic exercise;DME and/or AE instruction;Functional Mobility Training;Cognitive remediation/compensation;Balance training;Electrical Stimulation;Neuromuscular education;Manual Therapy;Splinting;Moist Heat;Contrast Bath;Therapeutic activities;Patient/family education;Passive range of motion    Consulted and Agree  with Plan of Care  Patient       Patient will benefit from skilled therapeutic intervention in order to improve the following deficits and impairments:   Body Structure / Function / Physical Skills: ADL, Coordination, GMC, UE functional use, Balance, Decreased knowledge of use of DME, Flexibility, IADL, Dexterity, FMC, Proprioception, Strength, Edema, ROM   Psychosocial Skills: Environmental  Adaptations, Habits, Routines and Behaviors   Visit Diagnosis: Muscle weakness (generalized)    Problem List Patient Active Problem List   Diagnosis Date Noted  . Mixed hyperlipidemia 09/28/2018  . Middle cerebral artery embolism, left 04/18/2018  . History of stroke 04/17/2018  . IFG (impaired fasting glucose) 03/28/2018  . Essential hypertension, benign 01/15/2017  . Asthma 01/15/2017    Olegario Messier, MS, OTR/L 04/05/2019, 3:44 PM  [facility] MAIN [facility] SERVICES [address] [place], [place], [address] Phone: [phone]   Fax:  [phone]  Name: TANIAH REINECKE MRN: [id number] Date of Birth: [birth year]

## 2019-04-05 NOTE — Therapy (Signed)
[place] MAIN [facility] SERVICES [address] [place], [place], [address] Phone: [phone]   Fax:  [phone]  Physical Therapy Treatment and Progress Note   Dates of reporting period   01/18/2019  to   04/05/2019   Patient Details  Name: Kristin Solis MRN: [id number] Date of Birth: 1955-10-29 Referring Provider (PT): Kristin Solis   Encounter Date: 04/05/2019  PT End of Session - 04/05/19 1043    Visit Number  10    Number of Visits  25    Date for PT Re-Evaluation  03/07/19    Authorization Type  eval 10/27    Authorization Time Period  approved 3 visist 11/4-11/13, 8 visits aproved ( 01/02/19-12/13), 3 visits approved 04/05/2019-04/25/2019    Authorization - Visit Number  1    Authorization - Number of Visits  3    PT Start Time  1055    PT Stop Time  3536    PT Time Calculation (min)  43 min    Equipment Utilized During Treatment  Gait belt    Activity Tolerance  Patient tolerated treatment well;No increased pain;Patient limited by fatigue    Behavior During Therapy  [facility] for tasks assessed/performed       Past Medical History:  Diagnosis Date  . Asthma   . Hypertension   . Stroke [facility])     Past Surgical History:  Procedure Laterality Date  . ABDOMINAL HYSTERECTOMY  2005   total  . IR CT HEAD LTD  04/18/2018  . IR INTRAVSC STENT CERV CAROTID W/O EMB-PROT MOD SED INC ANGIO  04/18/2018  . IR PERCUTANEOUS ART THROMBECTOMY/INFUSION INTRACRANIAL INC DIAG ANGIO  04/18/2018  . OOPHORECTOMY    . RADIOLOGY WITH ANESTHESIA N/A 04/17/2018   Procedure: RADIOLOGY WITH ANESTHESIA;  Surgeon: Luanne Bras, MD;  Location: [facility];  Service: Radiology;  Laterality: N/A;    There were no vitals filed for this visit.  Subjective Assessment - 04/05/19 1041    Subjective  Patient reported no pain at start of session, no changes to medications, no falls or stumbles. Reported compliance with HEP, no questions at this time.    Pertinent History   Patient is a 64 y.o. female with history of stroke receiving care previously from [facility]. She was initially admitted to ED on 04/17/18 with dysphagia and right sided weakness s/p left MCA CVA, extubated on 04/19/18 and discharged on 04/23/18 to private residence. Was independent with ADLs/IADLs prior to admission. Inpatient PT reported decreased safety awareness and awareness of deficits, with deficits in the areas of balance, ambulation ability, transfers. Was able to ambulate 125 feet in hospital with min guard without AD, and independent with bed mobility.    Limitations  Lifting;Standing;Walking;Writing;House hold activities    How long can you sit comfortably?  As long as desired    How long can you stand comfortably?  5 minutes    How long can you walk comfortably?  Able to grocery shop, requires a seated rest break for a couple of minutes.    Patient Stated Goals  Wants to be able to use right arm and hand, and to feel more confortable with going shopping with her husband.    Currently in Pain?  No/denies          Treatment: Therapeutic exercise: Nu-step L 2 x 5 mins cues for SPM >50 to maximize cardiovascular endurance, exercise tolerance, monitored throughout (leg press set at 2 for seat) Single  leg press with 40lbs x 6 reps, 25lbs x15 on RLE, 25lbs on LLE x20 Leg press heel raises  20 x 2 , 25 lbs   NMR:  TM x 2:00 mins  At . 5 miles/ hour (at end of session, pt fatigued unable to complete 6 minutes, RUE started to hurt). Cued for heel strike and stride length Rocker board fwd/back ea leg in front x20 no UE support Rocker board lateral weight shifts x20 no UE support Tandem stance on two foams x5 UE lift with 2.5lbs alt. Leg forward Feet apart on foam 2.5lbs 2x5 raises Forward/backwards step over hurdle x10 ea side one UE support Lateral step over heads x10 with 1 UE support, decreased to no UE for 3 reps    Patient performed with instruction, and mod verbal  cues, tactile cues of therapist: goal: increase tissue extensibility, promote proper posture, improve mobility   Pt response/clinical impression: Goals reassessed 03/29/19, carried forward this date for progress note. Pt demonstrated progression towards goals and remains motivated, reported improvement in deficits since beginning PT. During session today pt demonstrated deficits in gait, dynamic balance, and exercise tolerance/fatigue. Improvement in exercise technique with mod verbal/tactile cues to maximize proper muscle recruitment/activation. The patient would benefit from further skilled PT to continue to progress towards goals to maximize safety, mobility, and quality of life.   PT Education - 04/05/19 1042    Education Details  therapeutic exercise form/technique    Person(s) Educated  Patient    Methods  Explanation    Comprehension  Verbalized understanding;Returned demonstration;Verbal cues required       PT Short Term Goals - 01/18/19 1408      PT SHORT TERM GOAL #1   Title  Patient will be independent in initial home exercise program to improve strength/mobility for better functional independence with ADLs.    Baseline  Not made yet., 12/28/18: given a HEP but still needs cues for proper technique    Time  6    Period  Weeks    Status  Partially Met    Target Date  01/24/19      PT SHORT TERM GOAL #2   Title  Patient (> 45 years old) will complete five times sit to stand test in < 15 seconds indicating an increased LE strength and improved balance for decreased falls risk.    Baseline  12/13/18: 17.52 sec; 12/28/18: 13.53 sec without HHA    Time  6    Period  Weeks    Status  Achieved    Target Date  01/24/19        PT Long Term Goals - 03/29/19 1323      PT LONG TERM GOAL #1   Title  Patient will increase 10 meter walk test to >1.50ms as to improve gait speed for better community ambulation and to reduce fall risk.    Baseline  12/13/18: 0.64 m/s with rollator, 0.72  m/s without AD; 12/28/18: 0.84 m/s without AD 11/25: 1.0 m/s without AD    Time  12    Period  Weeks    Status  Achieved      PT LONG TERM GOAL #2   Title  Patient will increase six minute walk test distance to >1000 for progression to community ambulator and improve gait ability.    Baseline  12/13/18: 610 ft with rollator; 12/28/18: deferred 11/25: 723 ft without an AD, 01/18/19 640  03/29/19=deferred due to TM walking x 6 mins speed .03 miles /  hour    Time  12    Period  Weeks    Status  Partially Met    Target Date  05/02/19      PT LONG TERM GOAL #3   Title  Patient will be require no assist with ascend/descend 3 steps using Least restrictive assistive device without LOB to improve ability to get in/out of home.    Baseline  Dependent on rail assist to reach front door. 12/28/18: uses rail, one step at a time 11/25: able to ascend/descend step over step with BUE support, able to perform step to pattern with SUE support12/2/20=able to perform step to pattern with SUE support12    03/29/19= Able to perform step over step with less support with one rail support    Time  12    Period  Weeks    Status  Partially Met    Target Date  05/02/19      PT LONG TERM GOAL #4   Title  Patient will increase BLE gross strength to 4+/5 as to improve functional strength for independent gait, increased standing tolerance and increased ADL ability.    Baseline  12/13/18: RLE hip weakness; 12/28/18: RLE hip extension: 2+/5, hip abduction/adduction 3-/5, knee grossly 4-/5 11/25: RLE hip extension 3-/5 add/abd 3/5 knee 4/512/2/20=RLE hip extension 3-/5 add/abd 3/5 knee 4/5   03/29/19= RLE hip flex /abd 3+/5, hip extension 3+/5, R knee 4+/5    Time  12    Period  Weeks    Status  Partially Met    Target Date  05/02/19      PT LONG TERM GOAL #5   Title  Patient will increase Functional Gait Assessment score to >20/30 as to reduce fall risk and improve dynamic gait safety with community ambulation.    Baseline   12/28/18: 13/24, 01/18/19 13/30, 03/29/19 = 15/30    Time  12    Period  Weeks    Status  Partially Met    Target Date  05/02/19            Plan - 04/05/19 1042    Clinical Impression Statement  Goals reassessed 03/29/19, carried forward this date for progress note. Pt demonstrated progression towards goals and remains motivated, reported improvement in deficits since beginning PT. During session today pt demonstrated deficits in gait, dynamic balance, and exercise tolerance/fatigue. Improvement in exercise technique with mod verbal/tactile cues to maximize proper muscle recruitment/activation. The patient would benefit from further skilled PT to continue to progress towards goals to maximize safety, mobility, and quality of life.    Personal Factors and Comorbidities  Age;Comorbidity 2;Fitness;Time since onset of injury/illness/exacerbation    Comorbidities  HTN, Hx of stroke    Examination-Activity Limitations  Bathing;Bed Mobility;Bend;Carry;Dressing;Lift;Locomotion Level;Reach Overhead;Squat;Stairs;Stand;Transfers    Examination-Participation Restrictions  Cleaning;Community Activity;Driving;Laundry;Shop;Yard Work    Stability/Clinical Decision Making  Stable/Uncomplicated    Rehab Potential  Good    PT Frequency  2x / week    PT Duration  12 weeks    PT Treatment/Interventions  ADLs/Self Care Home Management;Aquatic Therapy;Cryotherapy;Electrical Stimulation;Moist Heat;DME Instruction;Gait training;Stair training;Functional mobility training;Therapeutic activities;Therapeutic exercise;Balance training;Neuromuscular re-education;Patient/family education;Orthotic Fit/Training;Manual techniques;Compression bandaging;Passive range of motion;Dry needling;Energy conservation;Taping    PT Next Visit Plan  review HEP, balance    PT Home Exercise Plan  No updates this date    Consulted and Agree with Plan of Care  Patient       Patient will benefit from skilled therapeutic intervention in  order to improve the following  deficits and impairments:  Abnormal gait, Decreased balance, Decreased endurance, Decreased mobility, Difficulty walking, Hypomobility, Decreased range of motion, Improper body mechanics, Decreased activity tolerance, Decreased strength, Impaired UE functional use, Postural dysfunction  Visit Diagnosis: Muscle weakness (generalized)  Other lack of coordination  Other abnormalities of gait and mobility  Hemiplegia and hemiparesis following cerebral infarction affecting right dominant side (HCC)  History of stroke     Problem List Patient Active Problem List   Diagnosis Date Noted  . Mixed hyperlipidemia 09/28/2018  . Middle cerebral artery embolism, left 04/18/2018  . History of stroke 04/17/2018  . IFG (impaired fasting glucose) 03/28/2018  . Essential hypertension, benign 01/15/2017  . Asthma 01/15/2017    Lieutenant Diego PT, DPT 11:51 AM,04/05/19  [place] MAIN [facility] SERVICES [address] [place], [place], [address] Phone: [phone]   Fax:  [phone]  Name: LASHUN RAMSEYER MRN: [id number] Date of Birth: Jan 27, 1956

## 2019-04-10 ENCOUNTER — Other Ambulatory Visit: Payer: Self-pay

## 2019-04-10 ENCOUNTER — Ambulatory Visit (INDEPENDENT_AMBULATORY_CARE_PROVIDER_SITE_OTHER): Payer: Medicaid Other | Admitting: Nurse Practitioner

## 2019-04-10 ENCOUNTER — Encounter: Payer: Self-pay | Admitting: Nurse Practitioner

## 2019-04-10 VITALS — BP 122/68 | HR 68 | Temp 98.0°F | Wt 201.2 lb

## 2019-04-10 DIAGNOSIS — E782 Mixed hyperlipidemia: Secondary | ICD-10-CM | POA: Diagnosis not present

## 2019-04-10 DIAGNOSIS — I10 Essential (primary) hypertension: Secondary | ICD-10-CM | POA: Diagnosis not present

## 2019-04-10 DIAGNOSIS — G4733 Obstructive sleep apnea (adult) (pediatric): Secondary | ICD-10-CM | POA: Diagnosis not present

## 2019-04-10 DIAGNOSIS — Z8673 Personal history of transient ischemic attack (TIA), and cerebral infarction without residual deficits: Secondary | ICD-10-CM | POA: Diagnosis not present

## 2019-04-10 DIAGNOSIS — E785 Hyperlipidemia, unspecified: Secondary | ICD-10-CM | POA: Diagnosis not present

## 2019-04-10 MED ORDER — LISINOPRIL 5 MG PO TABS: 5.0000 mg | ORAL_TABLET | Freq: Every day | ORAL | 3 refills | Status: DC

## 2019-04-10 MED ORDER — ATORVASTATIN CALCIUM 40 MG PO TABS: 40.0000 mg | ORAL_TABLET | Freq: Every day | ORAL | 11 refills | Status: DC

## 2019-04-10 MED ORDER — FLUTICASONE PROPIONATE 50 MCG/ACT NA SUSP: 2.0000 | Freq: Every day | NASAL | 3 refills | Status: DC

## 2019-04-10 NOTE — Assessment & Plan Note (Signed)
Recommend use of CPAP.  She is going to follow-up with neurology about alternate sleep mask that would offer more comfort.

## 2019-04-10 NOTE — Progress Notes (Signed)
BP 122/68 (BP Location: Left Arm, Patient Position: Sitting)   Pulse 68   Temp 98 F (36.7 C) (Oral)   Wt 201 lb 3.2 oz (91.3 kg)   SpO2 97%   BMI 35.64 kg/m    Subjective:    Patient ID: Kristin Solis, female    DOB: 1955/07/17, 64 y.o.   MRN: [id number]  HPI: Kristin Solis is a 64 y.o. female  Chief Complaint  Patient presents with  . Hyperlipidemia  . Hypertension   HYPERTENSION / HYPERLIPIDEMIA Continues on Lisinopril 5 MG, ASA, and Atorvastatin 40 MG daily.  Has history of sleep apnea, does not use CPAP machine, has one at home (was given to her in March 2020 after hospitalization).  Has history of CVA last March and is followed by neurology, Dr. Pearlean Brownie, sees next in April.  She has been working with PT and has had significant changes to right upper extremity, now able to move and do many things independently. Satisfied with current treatment? yes Duration of hypertension: chronic BP monitoring frequency: daily BP range: 133/68 this morning -- on average 130/70 range at home BP medication side effects: no Duration of hyperlipidemia: chronic Cholesterol medication side effects: no Cholesterol supplements: none Medication compliance: good compliance Aspirin: yes Recent stressors: no Recurrent headaches: no Visual changes: no Palpitations: no Dyspnea: no Chest pain: no Lower extremity edema: no Dizzy/lightheaded: no   ASTHMA Uses Advair every day, then Albuterol as needed.   Asthma status: stable Satisfied with current treatment?: yes Albuterol/rescue inhaler frequency: once or twice a month Dyspnea frequency: none Wheezing frequency: none Cough frequency: none Nocturnal symptom frequency:  Limitation of activity: no Current upper respiratory symptoms: no Triggers: seasonal Home peak flows:none Last Spirometry: unknown Failed/intolerant to following asthma meds: none Asthma meds in past: as above Aerochamber/spacer use: no Visits to ER or Urgent Care  in past year: no Pneumovax: unknown Influenza: Up to Date  Relevant past medical, surgical, family and social history reviewed and updated as indicated. Interim medical history since our last visit reviewed. Allergies and medications reviewed and updated.  Review of Systems  Constitutional: Negative for activity change, appetite change, diaphoresis, fatigue and fever.  Respiratory: Negative for cough, chest tightness and shortness of breath.   Cardiovascular: Negative for chest pain, palpitations and leg swelling.  Gastrointestinal: Negative.   Neurological: Negative for dizziness, syncope, speech difficulty, weakness, light-headedness, numbness and headaches.  Psychiatric/Behavioral: Negative.     Per HPI unless specifically indicated above     Objective:    BP 122/68 (BP Location: Left Arm, Patient Position: Sitting)   Pulse 68   Temp 98 F (36.7 C) (Oral)   Wt 201 lb 3.2 oz (91.3 kg)   SpO2 97%   BMI 35.64 kg/m   Wt Readings from Last 3 Encounters:  04/10/19 201 lb 3.2 oz (91.3 kg)  12/07/18 210 lb (95.3 kg)  11/23/18 215 lb 6.4 oz (97.7 kg)    Physical Exam Vitals and nursing note reviewed.  Constitutional:      General: She is awake. She is not in acute distress.    Appearance: She is well-developed. She is obese. She is not ill-appearing.  HENT:     Head: Normocephalic.     Right Ear: Hearing normal.     Left Ear: Hearing normal.  Eyes:     General: Lids are normal.        Right eye: No discharge.        Left eye: No  discharge.     Conjunctiva/sclera: Conjunctivae normal.     Pupils: Pupils are equal, round, and reactive to light.  Neck:     Thyroid: No thyromegaly.     Vascular: No carotid bruit.  Cardiovascular:     Rate and Rhythm: Normal rate and regular rhythm.     Heart sounds: Normal heart sounds. No murmur. No gallop.   Pulmonary:     Effort: Pulmonary effort is normal. No accessory muscle usage or respiratory distress.     Breath sounds: Normal  breath sounds.  Abdominal:     General: Bowel sounds are normal.     Palpations: Abdomen is soft.  Musculoskeletal:     Right hand: Swelling (trace) present. No tenderness. Decreased range of motion (mild, much improved). Decreased strength (mild much improved from previous). Normal sensation.     Left hand: Normal.     Cervical back: Normal range of motion and neck supple.     Right lower leg: No edema.     Left lower leg: No edema.  Skin:    General: Skin is warm and dry.  Neurological:     Mental Status: She is alert and oriented to person, place, and time.  Psychiatric:        Attention and Perception: Attention normal.        Mood and Affect: Mood normal.        Behavior: Behavior normal. Behavior is cooperative.        Thought Content: Thought content normal.        Judgment: Judgment normal.     Results for orders placed or performed in visit on 09/28/18  CBC with Differential/Platelet  Result Value Ref Range   WBC 6.3 3.4 - 10.8 x10E3/uL   RBC 4.88 3.77 - 5.28 x10E6/uL   Hemoglobin 13.4 11.1 - 15.9 g/dL   Hematocrit 25.0 03.7 - 46.6 %   MCV 85 79 - 97 fL   MCH 27.5 26.6 - 33.0 pg   MCHC 32.4 31.5 - 35.7 g/dL   RDW 04.8 88.9 - 16.9 %   Platelets 296 150 - 450 x10E3/uL   Neutrophils 80 Not Estab. %   Lymphs 11 Not Estab. %   Monocytes 6 Not Estab. %   Eos 1 Not Estab. %   Basos 1 Not Estab. %   Neutrophils Absolute 5.1 1.4 - 7.0 x10E3/uL   Lymphocytes Absolute 0.7 0.7 - 3.1 x10E3/uL   Monocytes Absolute 0.4 0.1 - 0.9 x10E3/uL   EOS (ABSOLUTE) 0.1 0.0 - 0.4 x10E3/uL   Basophils Absolute 0.0 0.0 - 0.2 x10E3/uL   Immature Granulocytes 1 Not Estab. %   Immature Grans (Abs) 0.0 0.0 - 0.1 x10E3/uL  Comprehensive metabolic panel  Result Value Ref Range   Glucose 139 (H) 65 - 99 mg/dL   BUN 13 8 - 27 mg/dL   Creatinine, Ser 4.50 0.57 - 1.00 mg/dL   GFR calc non Af Amer 61 >59 mL/min/1.73   GFR calc Af Amer 71 >59 mL/min/1.73   BUN/Creatinine Ratio 13 12 - 28    Sodium 146 (H) 134 - 144 mmol/L   Potassium 3.7 3.5 - 5.2 mmol/L   Chloride 109 (H) 96 - 106 mmol/L   CO2 24 20 - 29 mmol/L   Calcium 9.5 8.7 - 10.3 mg/dL   Total Protein 6.2 6.0 - 8.5 g/dL   Albumin 3.9 3.8 - 4.8 g/dL   Globulin, Total 2.3 1.5 - 4.5 g/dL   Albumin/Globulin Ratio 1.7 1.2 -  2.2   Bilirubin Total 0.4 0.0 - 1.2 mg/dL   Alkaline Phosphatase 125 (H) 39 - 117 IU/L   AST 17 0 - 40 IU/L   ALT 17 0 - 32 IU/L  Lipid Panel w/o Chol/HDL Ratio  Result Value Ref Range   Cholesterol, Total 128 100 - 199 mg/dL   Triglycerides 78 0 - 149 mg/dL   HDL 63 >39 mg/dL   VLDL Cholesterol Cal 16 5 - 40 mg/dL   LDL Calculated 49 0 - 99 mg/dL  TSH  Result Value Ref Range   TSH 1.320 0.450 - 4.500 uIU/mL  HgB A1c  Result Value Ref Range   Hgb A1c MFr Bld 5.6 4.8 - 5.6 %   Est. average glucose Bld gHb Est-mCnc 114 mg/dL      Assessment & Plan:   Problem List Items Addressed This Visit      Cardiovascular and Mediastinum   Essential hypertension, benign - Primary    Chronic, ongoing with goal BP at home on readings and on repeat in office today (initial in office elevated).  Continue current medication regimen and adjust as needed.  Continue daily BP checks at home.  Discussed strict goal BP due to history of stroke.  Labs today.  Return in 6 months.      Relevant Medications   atorvastatin (LIPITOR) 40 MG tablet   lisinopril (ZESTRIL) 5 MG tablet     Respiratory   Obstructive sleep apnea    Recommend use of CPAP.  She is going to follow-up with neurology about alternate sleep mask that would offer more comfort.          Other   History of stroke    Much improvement in right upper extremity and right lower extremity strength and function with PT on board.  She was very tearful and happy about this.  Praised for dedication to physical therapy and progress.      Mixed hyperlipidemia    Chronic, ongoing.  Continue current medication regimen and adjust as needed.  Recent LDL 49,  well below neuro goal <70.  Lipid panel today.      Relevant Medications   atorvastatin (LIPITOR) 40 MG tablet   lisinopril (ZESTRIL) 5 MG tablet   Other Relevant Orders   Comprehensive metabolic panel   Lipid Panel w/o Chol/HDL Ratio       Follow up plan: Return in about 6 months (around 10/08/2019) for HTN/HLD.

## 2019-04-10 NOTE — Patient Instructions (Signed)
DASH Eating Plan DASH stands for "Dietary Approaches to Stop Hypertension." The DASH eating plan is a healthy eating plan that has been shown to reduce high blood pressure (hypertension). It may also reduce your risk for type 2 diabetes, heart disease, and stroke. The DASH eating plan may also help with weight loss. What are tips for following this plan?  General guidelines  Avoid eating more than 2,300 mg (milligrams) of salt (sodium) a day. If you have hypertension, you may need to reduce your sodium intake to 1,500 mg a day.  Limit alcohol intake to no more than 1 drink a day for nonpregnant women and 2 drinks a day for men. One drink equals 12 oz of beer, 5 oz of wine, or 1 oz of hard liquor.  Work with your health care provider to maintain a healthy body weight or to lose weight. Ask what an ideal weight is for you.  Get at least 30 minutes of exercise that causes your heart to beat faster (aerobic exercise) most days of the week. Activities may include walking, swimming, or biking.  Work with your health care provider or diet and nutrition specialist (dietitian) to adjust your eating plan to your individual calorie needs. Reading food labels   Check food labels for the amount of sodium per serving. Choose foods with less than 5 percent of the Daily Value of sodium. Generally, foods with less than 300 mg of sodium per serving fit into this eating plan.  To find whole grains, look for the word "whole" as the first word in the ingredient list. Shopping  Buy products labeled as "low-sodium" or "no salt added."  Buy fresh foods. Avoid canned foods and premade or frozen meals. Cooking  Avoid adding salt when cooking. Use salt-free seasonings or herbs instead of table salt or sea salt. Check with your health care provider or pharmacist before using salt substitutes.  Do not fry foods. Cook foods using healthy methods such as baking, boiling, grilling, and broiling instead.  Cook with  heart-healthy oils, such as olive, canola, soybean, or sunflower oil. Meal planning  Eat a balanced diet that includes: ? 5 or more servings of fruits and vegetables each day. At each meal, try to fill half of your plate with fruits and vegetables. ? Up to 6-8 servings of whole grains each day. ? Less than 6 oz of lean meat, poultry, or fish each day. A 3-oz serving of meat is about the same size as a deck of cards. One egg equals 1 oz. ? 2 servings of low-fat dairy each day. ? A serving of nuts, seeds, or beans 5 times each week. ? Heart-healthy fats. Healthy fats called Omega-3 fatty acids are found in foods such as flaxseeds and coldwater fish, like sardines, salmon, and mackerel.  Limit how much you eat of the following: ? Canned or prepackaged foods. ? Food that is high in trans fat, such as fried foods. ? Food that is high in saturated fat, such as fatty meat. ? Sweets, desserts, sugary drinks, and other foods with added sugar. ? Full-fat dairy products.  Do not salt foods before eating.  Try to eat at least 2 vegetarian meals each week.  Eat more home-cooked food and less restaurant, buffet, and fast food.  When eating at a restaurant, ask that your food be prepared with less salt or no salt, if possible. What foods are recommended? The items listed may not be a complete list. Talk with your dietitian about   what dietary choices are best for you. Grains Whole-grain or whole-wheat bread. Whole-grain or whole-wheat pasta. Brown rice. Oatmeal. Quinoa. Bulgur. Whole-grain and low-sodium cereals. Pita bread. Low-fat, low-sodium crackers. Whole-wheat flour tortillas. Vegetables Fresh or frozen vegetables (raw, steamed, roasted, or grilled). Low-sodium or reduced-sodium tomato and vegetable juice. Low-sodium or reduced-sodium tomato sauce and tomato paste. Low-sodium or reduced-sodium canned vegetables. Fruits All fresh, dried, or frozen fruit. Canned fruit in natural juice (without  added sugar). Meat and other protein foods Skinless chicken or turkey. Ground chicken or turkey. Pork with fat trimmed off. Fish and seafood. Egg whites. Dried beans, peas, or lentils. Unsalted nuts, nut butters, and seeds. Unsalted canned beans. Lean cuts of beef with fat trimmed off. Low-sodium, lean deli meat. Dairy Low-fat (1%) or fat-free (skim) milk. Fat-free, low-fat, or reduced-fat cheeses. Nonfat, low-sodium ricotta or cottage cheese. Low-fat or nonfat yogurt. Low-fat, low-sodium cheese. Fats and oils Soft margarine without trans fats. Vegetable oil. Low-fat, reduced-fat, or light mayonnaise and salad dressings (reduced-sodium). Canola, safflower, olive, soybean, and sunflower oils. Avocado. Seasoning and other foods Herbs. Spices. Seasoning mixes without salt. Unsalted popcorn and pretzels. Fat-free sweets. What foods are not recommended? The items listed may not be a complete list. Talk with your dietitian about what dietary choices are best for you. Grains Baked goods made with fat, such as croissants, muffins, or some breads. Dry pasta or rice meal packs. Vegetables Creamed or fried vegetables. Vegetables in a cheese sauce. Regular canned vegetables (not low-sodium or reduced-sodium). Regular canned tomato sauce and paste (not low-sodium or reduced-sodium). Regular tomato and vegetable juice (not low-sodium or reduced-sodium). Pickles. Olives. Fruits Canned fruit in a light or heavy syrup. Fried fruit. Fruit in cream or butter sauce. Meat and other protein foods Fatty cuts of meat. Ribs. Fried meat. Bacon. Sausage. Bologna and other processed lunch meats. Salami. Fatback. Hotdogs. Bratwurst. Salted nuts and seeds. Canned beans with added salt. Canned or smoked fish. Whole eggs or egg yolks. Chicken or turkey with skin. Dairy Whole or 2% milk, cream, and half-and-half. Whole or full-fat cream cheese. Whole-fat or sweetened yogurt. Full-fat cheese. Nondairy creamers. Whipped toppings.  Processed cheese and cheese spreads. Fats and oils Butter. Stick margarine. Lard. Shortening. Ghee. Bacon fat. Tropical oils, such as coconut, palm kernel, or palm oil. Seasoning and other foods Salted popcorn and pretzels. Onion salt, garlic salt, seasoned salt, table salt, and sea salt. Worcestershire sauce. Tartar sauce. Barbecue sauce. Teriyaki sauce. Soy sauce, including reduced-sodium. Steak sauce. Canned and packaged gravies. Fish sauce. Oyster sauce. Cocktail sauce. Horseradish that you find on the shelf. Ketchup. Mustard. Meat flavorings and tenderizers. Bouillon cubes. Hot sauce and Tabasco sauce. Premade or packaged marinades. Premade or packaged taco seasonings. Relishes. Regular salad dressings. Where to find more information:  National Heart, Lung, and Blood Institute: www.nhlbi.nih.gov  American Heart Association: www.heart.org Summary  The DASH eating plan is a healthy eating plan that has been shown to reduce high blood pressure (hypertension). It may also reduce your risk for type 2 diabetes, heart disease, and stroke.  With the DASH eating plan, you should limit salt (sodium) intake to 2,300 mg a day. If you have hypertension, you may need to reduce your sodium intake to 1,500 mg a day.  When on the DASH eating plan, aim to eat more fresh fruits and vegetables, whole grains, lean proteins, low-fat dairy, and heart-healthy fats.  Work with your health care provider or diet and nutrition specialist (dietitian) to adjust your eating plan to your   individual calorie needs. This information is not intended to replace advice given to you by your health care provider. Make sure you discuss any questions you have with your health care provider. Document Revised: 01/15/2017 Document Reviewed: 01/27/2016 Elsevier Patient Education  2020 [employer]  

## 2019-04-10 NOTE — Assessment & Plan Note (Signed)
Chronic, ongoing with goal BP at home on readings and on repeat in office today (initial in office elevated).  Continue current medication regimen and adjust as needed.  Continue daily BP checks at home.  Discussed strict goal BP due to history of stroke.  Labs today.  Return in 6 months.

## 2019-04-10 NOTE — Assessment & Plan Note (Signed)
Chronic, ongoing.  Continue current medication regimen and adjust as needed.  Recent LDL 49, well below neuro goal <70.  Lipid panel today.

## 2019-04-10 NOTE — Assessment & Plan Note (Signed)
Much improvement in right upper extremity and right lower extremity strength and function with PT on board.  She was very tearful and happy about this.  Praised for dedication to physical therapy and progress.

## 2019-04-11 LAB — LIPID PANEL W/O CHOL/HDL RATIO
Cholesterol, Total: 135 mg/dL (ref 100–199)
HDL: 68 mg/dL (ref >39)
LDL Chol Calc (NIH): 54 mg/dL (ref 0–99)
Triglycerides: 62 mg/dL (ref 0–149)
VLDL Cholesterol Cal: 13 mg/dL (ref 5–40)

## 2019-04-11 LAB — COMPREHENSIVE METABOLIC PANEL
ALT: 19 IU/L (ref 0–32)
AST: 20 IU/L (ref 0–40)
Albumin/Globulin Ratio: 1.3 (ref 1.2–2.2)
Albumin: 3.7 g/dL — ABNORMAL LOW (ref 3.8–4.8)
Alkaline Phosphatase: 117 IU/L (ref 39–117)
BUN/Creatinine Ratio: 15 (ref 12–28)
BUN: 14 mg/dL (ref 8–27)
Bilirubin Total: 0.4 mg/dL (ref 0.0–1.2)
CO2: 24 mmol/L (ref 20–29)
Calcium: 9.6 mg/dL (ref 8.7–10.3)
Chloride: 107 mmol/L — ABNORMAL HIGH (ref 96–106)
Creatinine, Ser: 0.91 mg/dL (ref 0.57–1.00)
GFR calc Af Amer: 78 mL/min/{1.73_m2} (ref >59)
GFR calc non Af Amer: 68 mL/min/{1.73_m2} (ref >59)
Globulin, Total: 2.8 g/dL (ref 1.5–4.5)
Glucose: 79 mg/dL (ref 65–99)
Potassium: 4.3 mmol/L (ref 3.5–5.2)
Sodium: 144 mmol/L (ref 134–144)
Total Protein: 6.5 g/dL (ref 6.0–8.5)

## 2019-04-11 NOTE — Progress Notes (Signed)
Contacted via MyChart

## 2019-04-12 ENCOUNTER — Ambulatory Visit: Payer: Medicaid Other | Admitting: Physical Therapy

## 2019-04-12 ENCOUNTER — Encounter: Payer: Medicaid Other | Admitting: Occupational Therapy

## 2019-04-13 ENCOUNTER — Telehealth: Payer: Self-pay | Admitting: Pharmacy Technician

## 2019-04-13 NOTE — Telephone Encounter (Signed)
Patient has Medicaid with prescription drug coverage.  No longer meets [facility] eligibility criteria.  Patient notified.  Sherilyn Dacosta Care Manager [facility]

## 2019-04-18 ENCOUNTER — Ambulatory Visit: Payer: Medicaid Other | Attending: Nurse Practitioner

## 2019-04-18 ENCOUNTER — Encounter: Payer: Self-pay | Admitting: Occupational Therapy

## 2019-04-18 ENCOUNTER — Ambulatory Visit: Payer: Medicaid Other | Admitting: Occupational Therapy

## 2019-04-18 ENCOUNTER — Other Ambulatory Visit: Payer: Self-pay

## 2019-04-18 DIAGNOSIS — M6281 Muscle weakness (generalized): Secondary | ICD-10-CM | POA: Insufficient documentation

## 2019-04-18 DIAGNOSIS — I69351 Hemiplegia and hemiparesis following cerebral infarction affecting right dominant side: Secondary | ICD-10-CM | POA: Insufficient documentation

## 2019-04-18 DIAGNOSIS — R2681 Unsteadiness on feet: Secondary | ICD-10-CM | POA: Diagnosis not present

## 2019-04-18 DIAGNOSIS — R2689 Other abnormalities of gait and mobility: Secondary | ICD-10-CM | POA: Diagnosis not present

## 2019-04-18 DIAGNOSIS — R278 Other lack of coordination: Secondary | ICD-10-CM | POA: Insufficient documentation

## 2019-04-18 NOTE — Therapy (Signed)
[place] MAIN [facility] SERVICES [address] [place], [place], [address] Phone: [phone]   Fax:  [phone]  Physical Therapy Treatment  Patient Details  Name: Kristin Solis MRN: [id number] Date of Birth: Dec 14, 1955 Referring Provider (PT): [name]   Encounter Date: 04/18/2019  PT End of Session - 04/18/19 1443    Visit Number  11    Number of Visits  25    Date for PT Re-Evaluation  03/07/19    Authorization Type  eval 10/27    Authorization Time Period  approved 3 visist 11/4-11/13, 8 visits aproved ( 01/02/19-12/13), 3 visits approved 04/05/2019-04/25/2019    PT Start Time  1438    PT Stop Time  1510    PT Time Calculation (min)  32 min    Equipment Utilized During Treatment  Gait belt    Activity Tolerance  Patient tolerated treatment well;No increased pain;Patient limited by fatigue    Behavior During Therapy  [facility] for tasks assessed/performed       Past Medical History:  Diagnosis Date  . Asthma   . Hypertension   . Stroke [facility])     Past Surgical History:  Procedure Laterality Date  . ABDOMINAL HYSTERECTOMY  2005   total  . IR CT HEAD LTD  04/18/2018  . IR INTRAVSC STENT CERV CAROTID W/O EMB-PROT MOD SED INC ANGIO  04/18/2018  . IR PERCUTANEOUS ART THROMBECTOMY/INFUSION INTRACRANIAL INC DIAG ANGIO  04/18/2018  . OOPHORECTOMY    . RADIOLOGY WITH ANESTHESIA N/A 04/17/2018   Procedure: RADIOLOGY WITH ANESTHESIA;  Surgeon: Luanne Bras, MD;  Location: [place];  Service: Radiology;  Laterality: N/A;    There were no vitals filed for this visit.  Subjective Assessment - 04/18/19 1442    Subjective  Pt doing well today, report sno updates since last session. Pt surprised that Minette Headland has not returned from the [facility].    Pertinent History  Patient is a 64 y.o. female with history of stroke receiving care previously from [facility]. She was initially admitted to ED on 04/17/18 with dysphagia and right  sided weakness s/p left MCA CVA, extubated on 04/19/18 and discharged on 04/23/18 to private residence. Was independent with ADLs/IADLs prior to admission. Inpatient PT reported decreased safety awareness and awareness of deficits, with deficits in the areas of balance, ambulation ability, transfers. Was able to ambulate 125 feet in hospital with min guard without AD, and independent with bed mobility.    Currently in Pain?  No/denies      INTERVENTION THIS DATE   Treatment: Therapeutic exercise: Single leg press 35lb: 1x15 each side singles, then 1x20 bilat Leg press heel raises  2x25 lbs   NMR:  Rocker board fwd/back ea leg in front x20 no UE support (FWD lunge stance) Rocker board lateral weight shifts x20 no UE support Tandem stance on two foams x10 UE lift with 2.5lbs straight weight each leg fwd (each direction) Narrow stance foam with physioball trunk rotation 10x each direction (moved to basketball for easier grip)  Foam stance (semi narrow) basket ball self toss.  FWD step up floor to foam 1x15 bilat        PT Short Term Goals - 01/18/19 1408      PT SHORT TERM GOAL #1   Title  Patient will be independent in initial home exercise program to improve strength/mobility for better functional independence with ADLs.    Baseline  Not made yet., 12/28/18: given  a HEP but still needs cues for proper technique    Time  6    Period  Weeks    Status  Partially Met    Target Date  01/24/19      PT SHORT TERM GOAL #2   Title  Patient (> 33 years old) will complete five times sit to stand test in < 15 seconds indicating an increased LE strength and improved balance for decreased falls risk.    Baseline  12/13/18: 17.52 sec; 12/28/18: 13.53 sec without HHA    Time  6    Period  Weeks    Status  Achieved    Target Date  01/24/19        PT Long Term Goals - 03/29/19 1323      PT LONG TERM GOAL #1   Title  Patient will increase 10 meter walk test to >1.68ms as to improve gait speed  for better community ambulation and to reduce fall risk.    Baseline  12/13/18: 0.64 m/s with rollator, 0.72 m/s without AD; 12/28/18: 0.84 m/s without AD 11/25: 1.0 m/s without AD    Time  12    Period  Weeks    Status  Achieved      PT LONG TERM GOAL #2   Title  Patient will increase six minute walk test distance to >1000 for progression to community ambulator and improve gait ability.    Baseline  12/13/18: 610 ft with rollator; 12/28/18: deferred 11/25: 723 ft without an AD, 01/18/19 640  03/29/19=deferred due to TM walking x 6 mins speed .03 miles / hour    Time  12    Period  Weeks    Status  Partially Met    Target Date  05/02/19      PT LONG TERM GOAL #3   Title  Patient will be require no assist with ascend/descend 3 steps using Least restrictive assistive device without LOB to improve ability to get in/out of home.    Baseline  Dependent on rail assist to reach front door. 12/28/18: uses rail, one step at a time 11/25: able to ascend/descend step over step with BUE support, able to perform step to pattern with SUE support12/2/20=able to perform step to pattern with SUE support12    03/29/19= Able to perform step over step with less support with one rail support    Time  12    Period  Weeks    Status  Partially Met    Target Date  05/02/19      PT LONG TERM GOAL #4   Title  Patient will increase BLE gross strength to 4+/5 as to improve functional strength for independent gait, increased standing tolerance and increased ADL ability.    Baseline  12/13/18: RLE hip weakness; 12/28/18: RLE hip extension: 2+/5, hip abduction/adduction 3-/5, knee grossly 4-/5 11/25: RLE hip extension 3-/5 add/abd 3/5 knee 4/512/2/20=RLE hip extension 3-/5 add/abd 3/5 knee 4/5   03/29/19= RLE hip flex /abd 3+/5, hip extension 3+/5, R knee 4+/5    Time  12    Period  Weeks    Status  Partially Met    Target Date  05/02/19      PT LONG TERM GOAL #5   Title  Patient will increase Functional Gait Assessment  score to >20/30 as to reduce fall risk and improve dynamic gait safety with community ambulation.    Baseline  12/28/18: 13/24, 01/18/19 13/30, 03/29/19 = 15/30    Time  12    Period  Weeks    Status  Partially Met    Target Date  05/02/19            Plan - 04/18/19 1444    Clinical Impression Statement  In general, patient demonstrating good tolerance to therapy session this date, reasonable accommodations are alllowed in-session to allow adequate rest between activities as needed. All interventional executed without any exacerbation of pain or other symptoms.  Pt has good familiarity with program, elects to avoid progression in many areas as she say challenge is appropriate. Pt continues to make steady progress toward most goals. No home exercise updates made at this time.    Examination-Activity Limitations  Bathing;Bed Mobility;Bend;Carry;Dressing;Lift;Locomotion Level;Reach Overhead;Squat;Stairs;Stand;Transfers    Rehab Potential  Good    PT Frequency  2x / week    PT Duration  12 weeks    PT Treatment/Interventions  ADLs/Self Care Home Management;Aquatic Therapy;Cryotherapy;Electrical Stimulation;Moist Heat;DME Instruction;Gait training;Stair training;Functional mobility training;Therapeutic activities;Therapeutic exercise;Balance training;Neuromuscular re-education;Patient/family education;Orthotic Fit/Training;Manual techniques;Compression bandaging;Passive range of motion;Dry needling;Energy conservation;Taping    PT Next Visit Plan  review HEP, balance    PT Home Exercise Plan  No updates this date    Consulted and Agree with Plan of Care  Patient       Patient will benefit from skilled therapeutic intervention in order to improve the following deficits and impairments:  Abnormal gait, Decreased balance, Decreased endurance, Decreased mobility, Difficulty walking, Hypomobility, Decreased range of motion, Improper body mechanics, Decreased activity tolerance, Decreased strength,  Impaired UE functional use, Postural dysfunction  Visit Diagnosis: Muscle weakness (generalized)  Other abnormalities of gait and mobility  Hemiplegia and hemiparesis following cerebral infarction affecting right dominant side (HCC)  Unsteadiness on feet     Problem List Patient Active Problem List   Diagnosis Date Noted  . Mixed hyperlipidemia 09/28/2018  . Middle cerebral artery embolism, left 04/18/2018  . History of stroke 04/17/2018  . IFG (impaired fasting glucose) 03/28/2018  . Essential hypertension, benign 01/15/2017  . Asthma 01/15/2017  . Obstructive sleep apnea 10/18/2013   2:49 PM, 04/18/19 Etta Grandchild, PT, DPT Physical Therapist - [place] [facility] 04/18/2019, 2:47 PM  [place] MAIN [facility] SERVICES [address] [place], [place], [address] Phone: [phone]   Fax:  [phone]  Name: Kristin Solis MRN: [id number] Date of Birth: Feb 10, 1956

## 2019-04-19 ENCOUNTER — Encounter: Payer: Self-pay | Admitting: Occupational Therapy

## 2019-04-19 DIAGNOSIS — N3944 Nocturnal enuresis: Secondary | ICD-10-CM | POA: Diagnosis not present

## 2019-04-19 DIAGNOSIS — R32 Unspecified urinary incontinence: Secondary | ICD-10-CM | POA: Diagnosis not present

## 2019-04-19 NOTE — Therapy (Addendum)
[place] [facility] MAIN [facility] SERVICES [address] [place], [place], [address] Phone: [phone]   Fax:  [phone]  Occupational Therapy Treatment  Patient Details  Name: Kristin Solis MRN: [id number] Date of Birth: 12/02/1955 Referring Provider (OT): Aura Dials   Encounter Date: 04/18/2019  OT End of Session - 04/19/19 0838    Visit Number  23    Number of Visits  37    Date for OT Re-Evaluation  04/28/18    Authorization Type  Progress report period strating 01/26/2019    OT Start Time  1515    OT Stop Time  1600    OT Time Calculation (min)  45 min    Activity Tolerance  Patient tolerated treatment well    Behavior During Therapy  [facility] for tasks assessed/performed       Past Medical History:  Diagnosis Date  . Asthma   . Hypertension   . Stroke [facility])     Past Surgical History:  Procedure Laterality Date  . ABDOMINAL HYSTERECTOMY  2005   total  . IR CT HEAD LTD  04/18/2018  . IR INTRAVSC STENT CERV CAROTID W/O EMB-PROT MOD SED INC ANGIO  04/18/2018  . IR PERCUTANEOUS ART THROMBECTOMY/INFUSION INTRACRANIAL INC DIAG ANGIO  04/18/2018  . OOPHORECTOMY    . RADIOLOGY WITH ANESTHESIA N/A 04/17/2018   Procedure: RADIOLOGY WITH ANESTHESIA;  Surgeon: Julieanne Cotton, MD;  Location: [facility] OR;  Service: Radiology;  Laterality: N/A;    There were no vitals filed for this visit.  Subjective Assessment - 04/18/19 1609    Subjective   Pt. reports being tearful as it has been one year since the onset of the stroke. Pt. became tearful talking about it. Pt reports they are tears of joy.    Patient is accompanied by:  Family member    Pertinent History  Patient is a 64 y.o. female with history of stroke,  admitted to ED on 04/17/18 with dysphagia and right sided weakness s/p left MCA CVA, extubated on 04/19/18 and discharged on 04/23/18 to private residence       OT TREATMENT     Neuro muscular re-education:  Pt. worked on grasping one inch  resistive cubes alternating thumb opposition to the tip of the 2nd digit using a 2pt. grasp.  The board was positioned at a vertical angle to encourage wrist extension when manipulating the objects. Pt. worked on pressing them back into place while isolating 2nd digit extension.  Therapeutic Exercise:  Pt. tolerated AROM/AAROM with PROM to the end range for shoulder abduction, and flexion, horizontal abduction. Pt. presents with increased compensation from other muscle groups when attempting AROM. Pt. performed right gross gripping with grip strengthener. Pt. worked on sustaining grip while grasping pegs and reaching at various heights. The gripper was set at 6.6#. Assist was required initially to maintain the gripper in a vertical, upright position while gripping. Pt. required rest breaks. Pt. Worked on gripping using the [place] hand strengthener registering at 11.2#          Pt. is making steady progress, and is using her right hand more during ADL, and IADL tasks at home. Pt. presents with increased edema in the right hand today. Pt. reports that it is from her hand being positioned down more today, and that she has not yet been able to work on it today secondary to being out. Pt. is making progress with right hand grip strengthening, as well as grasping, and manipulating small  objects.Pt. continues to work on improving RUE, and hand function in preparation for increasing functional hand use during ADLs, and IADL tasks.                   OT Long Term Goals - 03/21/19 1158      OT LONG TERM GOAL #1   Title  Patient will complete home exercise program with modified independence.    Baseline  addition of new exercises ongoing as patient progresses with RUE.    Time  12    Period  Weeks    Status  On-going    Target Date  04/28/19      OT LONG TERM GOAL #3   Title  Patient will demonstrate improved grip by 10 # in right Hand to be able to hold items in hand with gross  grasp.    Baseline  Pt. is able to initate holding items    Time  6    Period  Weeks    Status  On-going    Target Date  04/28/19      OT LONG TERM GOAL #5   Title  Patient will improve right hand function to be able to grasp pen to sign name on important papers with good legibility.    Baseline  Pt. is able to hold and use a standard pen for printing her name, however has difficulty signing her name. Pt. with 75% legibility for printed form with positive deviation above the line. Requiring increased time for letter formation.    Time  12    Period  Weeks    Status  On-going    Target Date  04/28/19      OT LONG TERM GOAL #6   Time  12      OT LONG TERM GOAL #7   Title  Patient will improve right hand function to manipulate buttons, snaps, zippers with modified independence.    Baseline  Pt. continues to be unable to manipulate buttons.    Time  12    Period  Weeks    Status  On-going    Target Date  04/28/19      OT LONG TERM GOAL #8   Title  Patient will demonstrate the ability to stir items in a bowl of varying consistencies with modified independence.    Baseline  Pt. continues to have difficulty    Time  12    Period  Weeks    Status  On-going    Target Date  04/28/19      OT LONG TERM GOAL  #9   TITLE  Patient will demonstrate the ability to move pots and pans from one location to another in the kitchen with modified independence.    Baseline  unable    Time  12    Period  Weeks    Status  On-going    Target Date  04/28/19      OT LONG TERM GOAL  #10   TITLE  Patient will improve right shoulder flexion by 10 degrees to assist with reaching and placing objects on table.    Baseline  Pt. is improving with right shoulder ROM. Pt. continues to have difficulty placing items on the table.    Time  12    Period  Weeks    Status  On-going    Target Date  04/28/19            Plan - 04/19/19 [phone]  Clinical Impression Statement Pt. is making steady progress, and  is using her right hand more during ADL, and IADL tasks at home. Pt. presents with increased edema in the right hand today. Pt. reports that it is from her hand being positioned down more today, and that she has not yet been able to work on it today secondary to being out. Pt. is making progress with right hand grip strengthening, as well as grasping, and manipulating small objects.Pt. continues to work on improving RUE, and hand function in preparation for increasing functional hand use during ADLs, and IADL tasks.    OT Occupational Profile and History  Detailed Assessment- Review of Records and additional review of physical, cognitive, psychosocial history related to current functional performance    Occupational performance deficits (Please refer to evaluation for details):  ADL's;IADL's;Work;Leisure    Body Structure / Function / Physical Skills  ADL;Coordination;GMC;UE functional use;Balance;Decreased knowledge of use of DME;Flexibility;IADL;Dexterity;FMC;Proprioception;Strength;Edema;ROM    Cognitive Skills  Memory    Psychosocial Skills  Environmental  Adaptations;Habits;Routines and Behaviors    Rehab Potential  Good    Clinical Decision Making  Limited treatment options, no task modification necessary    Comorbidities Affecting Occupational Performance:  May have comorbidities impacting occupational performance    Modification or Assistance to Complete Evaluation   No modification of tasks or assist necessary to complete eval    OT Frequency  2x / week    OT Duration  12 weeks    OT Treatment/Interventions  Self-care/ADL training;Cryotherapy;Therapeutic exercise;DME and/or AE instruction;Functional Mobility Training;Cognitive remediation/compensation;Balance training;Electrical Stimulation;Neuromuscular education;Manual Therapy;Splinting;Moist Heat;Contrast Bath;Therapeutic activities;Patient/family education;Passive range of motion    Consulted and Agree with Plan of Care  Patient        Patient will benefit from skilled therapeutic intervention in order to improve the following deficits and impairments:   Body Structure / Function / Physical Skills: ADL, Coordination, GMC, UE functional use, Balance, Decreased knowledge of use of DME, Flexibility, IADL, Dexterity, FMC, Proprioception, Strength, Edema, ROM Cognitive Skills: Memory Psychosocial Skills: Environmental  Adaptations, Habits, Routines and Behaviors   Visit Diagnosis: Muscle weakness (generalized)  Other lack of coordination    Problem List Patient Active Problem List   Diagnosis Date Noted  . Mixed hyperlipidemia 09/28/2018  . Middle cerebral artery embolism, left 04/18/2018  . History of stroke 04/17/2018  . IFG (impaired fasting glucose) 03/28/2018  . Essential hypertension, benign 01/15/2017  . Asthma 01/15/2017  . Obstructive sleep apnea 10/18/2013    Olegario Messier, MS, OTR/L 04/19/2019, 8:49 AM  [place] [facility] MAIN [facility] SERVICES [address] [place], [place], [address] Phone: [phone]   Fax:  [phone]  Name: Kristin Solis MRN: [id number] Date of Birth: Nov 14, 1955

## 2019-04-26 ENCOUNTER — Ambulatory Visit: Payer: Medicaid Other | Admitting: Physical Therapy

## 2019-04-26 ENCOUNTER — Encounter: Payer: Medicaid Other | Admitting: Occupational Therapy

## 2019-05-02 ENCOUNTER — Other Ambulatory Visit: Payer: Self-pay

## 2019-05-02 ENCOUNTER — Ambulatory Visit: Payer: Medicaid Other | Admitting: Occupational Therapy

## 2019-05-02 ENCOUNTER — Ambulatory Visit: Payer: Medicaid Other | Admitting: Physical Therapy

## 2019-05-02 DIAGNOSIS — I69351 Hemiplegia and hemiparesis following cerebral infarction affecting right dominant side: Secondary | ICD-10-CM | POA: Diagnosis not present

## 2019-05-02 DIAGNOSIS — R278 Other lack of coordination: Secondary | ICD-10-CM

## 2019-05-02 DIAGNOSIS — M6281 Muscle weakness (generalized): Secondary | ICD-10-CM

## 2019-05-02 DIAGNOSIS — R2681 Unsteadiness on feet: Secondary | ICD-10-CM | POA: Diagnosis not present

## 2019-05-02 DIAGNOSIS — R2689 Other abnormalities of gait and mobility: Secondary | ICD-10-CM | POA: Diagnosis not present

## 2019-05-02 NOTE — Therapy (Signed)
[facility] MAIN [facility] SERVICES [address] [place], [place], [address] Phone: [phone]   Fax:  [phone]  Occupational Therapy Treatment  Patient Details  Name: Kristin Solis MRN: [id number] Date of Birth: 10/09/1955 Referring Provider (OT): Aura Dials   Encounter Date: 05/02/2019  OT End of Session - 05/02/19 1556    Visit Number  24    Number of Visits  37    Date for OT Re-Evaluation  04/28/18    Authorization Type  Progress report period strating 01/26/2019    OT Start Time  1530    OT Stop Time  1615    OT Time Calculation (min)  45 min    Activity Tolerance  Patient tolerated treatment well    Behavior During Therapy  [facility] for tasks assessed/performed       Past Medical History:  Diagnosis Date  . Asthma   . Hypertension   . Stroke [facility])     Past Surgical History:  Procedure Laterality Date  . ABDOMINAL HYSTERECTOMY  2005   total  . IR CT HEAD LTD  04/18/2018  . IR INTRAVSC STENT CERV CAROTID W/O EMB-PROT MOD SED INC ANGIO  04/18/2018  . IR PERCUTANEOUS ART THROMBECTOMY/INFUSION INTRACRANIAL INC DIAG ANGIO  04/18/2018  . OOPHORECTOMY    . RADIOLOGY WITH ANESTHESIA N/A 04/17/2018   Procedure: RADIOLOGY WITH ANESTHESIA;  Surgeon: Julieanne Cotton, MD;  Location: [facility] OR;  Service: Radiology;  Laterality: N/A;    There were no vitals filed for this visit.  Subjective Assessment - 05/02/19 1531    Subjective   Pt was upset about her Medicaid insurance and how to understand how visits per year work which was discussed with Derrek Gu, Outpatient Supervisor.    Patient is accompanied by:  Family member    Pertinent History  Patient is a 64 y.o. female with history of stroke,  admitted to ED on 04/17/18 with dysphagia and right sided weakness s/p left MCA CVA, extubated on 04/19/18 and discharged on 04/23/18 to private residence    Patient Stated Goals  Patient reports she would like to get her right hand to move more and be able  to use for daily tasks.    Currently in Pain?  No/denies       OT TREATMENT     Neuro muscular re-education:  Pt. worked on grasping one inch resistive cubes alternating thumb opposition to the tip of the 2nd digit using a 2pt. grasp.  The board was positioned at a vertical angle to encourage wrist extension when manipulating the objects. Pt. worked on pressing them back into place while isolating 2nd digit extension.  Progressed to resistive clothespins using red, yellow and then green using 2 point pinch pattern.  Also worked on picking up toothpicks and placing into container with R hand with education on how to complete at home as well.  Manual therapy:  Scapular mobilization to RUE with arm supported at 90 degrees of flexion at shoulder with elbow extended with releases to pectoralis major and upper trapezius muscles to help decrease pain at mid range.Pt was able to flex and extend at shoulder without a "catching pain" in shoulder joint after session.  Pt.with less edema today and no pain. Pt. is making progress with right hand grip strengthening, as well as grasping, and manipulating small objects.Pt. continues to work on improving RUE, and hand function in preparation for increasing functional hand use during ADLs, and IADL tasks.  OT Education - 05/02/19 1556    Education Details  RUE functioning, hand exercises, [facility]    Person(s) Educated  Patient    Methods  Explanation;Demonstration    Comprehension  Verbalized understanding;Returned demonstration          OT Long Term Goals - 03/21/19 1158      OT LONG TERM GOAL #1   Title  Patient will complete home exercise program with modified independence.    Baseline  addition of new exercises ongoing as patient progresses with RUE.    Time  12    Period  Weeks    Status  On-going    Target Date  04/28/19      OT LONG TERM GOAL #3   Title  Patient will demonstrate improved grip by  10 # in right Hand to be able to hold items in hand with gross grasp.    Baseline  Pt. is able to initate holding items    Time  6    Period  Weeks    Status  On-going    Target Date  04/28/19      OT LONG TERM GOAL #5   Title  Patient will improve right hand function to be able to grasp pen to sign name on important papers with good legibility.    Baseline  Pt. is able to hold and use a standard pen for printing her name, however has difficulty signing her name. Pt. with 75% legibility for printed form with positive deviation above the line. Requiring increased time for letter formation.    Time  12    Period  Weeks    Status  On-going    Target Date  04/28/19      OT LONG TERM GOAL #6   Time  12      OT LONG TERM GOAL #7   Title  Patient will improve right hand function to manipulate buttons, snaps, zippers with modified independence.    Baseline  Pt. continues to be unable to manipulate buttons.    Time  12    Period  Weeks    Status  On-going    Target Date  04/28/19      OT LONG TERM GOAL #8   Title  Patient will demonstrate the ability to stir items in a bowl of varying consistencies with modified independence.    Baseline  Pt. continues to have difficulty    Time  12    Period  Weeks    Status  On-going    Target Date  04/28/19      OT LONG TERM GOAL  #9   TITLE  Patient will demonstrate the ability to move pots and pans from one location to another in the kitchen with modified independence.    Baseline  unable    Time  12    Period  Weeks    Status  On-going    Target Date  04/28/19      OT LONG TERM GOAL  #10   TITLE  Patient will improve right shoulder flexion by 10 degrees to assist with reaching and placing objects on table.    Baseline  Pt. is improving with right shoulder ROM. Pt. continues to have difficulty placing items on the table.    Time  12    Period  Weeks    Status  On-going    Target Date  04/28/19            Plan -  05/02/19 1557     Clinical Impression Statement  Pt. is making steady progress, and is using her right hand more during ADL, and IADL tasks at home. Pt. presents with decreased edema in the right hand today and reports the glove is working well. Pt. reports that it is from her hand being positioned down more today, and that she has not yet been able to work on it today secondary to being out. Pt. is making progress with right hand grip strengthening, as well as grasping, and manipulating small objects.Pt. continues to work on improving RUE, and hand function in preparation for increasing functional hand use during ADLs, and IADL tasks.    OT Occupational Profile and History  Detailed Assessment- Review of Records and additional review of physical, cognitive, psychosocial history related to current functional performance    Occupational performance deficits (Please refer to evaluation for details):  ADL's;IADL's;Work;Leisure    Body Structure / Function / Physical Skills  ADL;Coordination;GMC;UE functional use;Balance;Decreased knowledge of use of DME;Flexibility;IADL;Dexterity;FMC;Proprioception;Strength;Edema;ROM    Cognitive Skills  Memory    Psychosocial Skills  Environmental  Adaptations;Habits;Routines and Behaviors    Rehab Potential  Good    Clinical Decision Making  Limited treatment options, no task modification necessary    Comorbidities Affecting Occupational Performance:  May have comorbidities impacting occupational performance    Modification or Assistance to Complete Evaluation   No modification of tasks or assist necessary to complete eval    OT Frequency  2x / week    OT Duration  12 weeks    OT Treatment/Interventions  Self-care/ADL training;Cryotherapy;Therapeutic exercise;DME and/or AE instruction;Functional Mobility Training;Cognitive remediation/compensation;Balance training;Electrical Stimulation;Neuromuscular education;Manual Therapy;Splinting;Moist Heat;Contrast Bath;Therapeutic  activities;Patient/family education;Passive range of motion    Consulted and Agree with Plan of Care  Patient       Patient will benefit from skilled therapeutic intervention in order to improve the following deficits and impairments:   Body Structure / Function / Physical Skills: ADL, Coordination, GMC, UE functional use, Balance, Decreased knowledge of use of DME, Flexibility, IADL, Dexterity, FMC, Proprioception, Strength, Edema, ROM Cognitive Skills: Memory Psychosocial Skills: Environmental  Adaptations, Habits, Routines and Behaviors   Visit Diagnosis: Muscle weakness (generalized)  Other lack of coordination    Problem List Patient Active Problem List   Diagnosis Date Noted  . Mixed hyperlipidemia 09/28/2018  . Middle cerebral artery embolism, left 04/18/2018  . History of stroke 04/17/2018  . IFG (impaired fasting glucose) 03/28/2018  . Essential hypertension, benign 01/15/2017  . Asthma 01/15/2017  . Obstructive sleep apnea 10/18/2013    Susanne Borders, OTR/L, [facility] ascom [phone] 05/02/19, 4:16 PM  [facility] MAIN [facility] SERVICES [address] [place], [place], [address] Phone: [phone]   Fax:  [phone]  Name: GRACELEE STEMMLER MRN: [id number] Date of Birth: [birth year]

## 2019-05-04 IMAGING — MG MM DIGITAL SCREENING BILAT W/ TOMO W/ CAD
8 of 18 series · 8 of 34 positions shown · non-contrast
Comparison: Previous exam(s).

ACR Breast Density Category a: The breast tissue is almost entirely
fatty.

CLINICAL DATA: Screening.

EXAM:
2D DIGITAL SCREENING BILATERAL MAMMOGRAM WITH CAD AND ADJUNCT TOMO

[R MLO (1 of 2)]
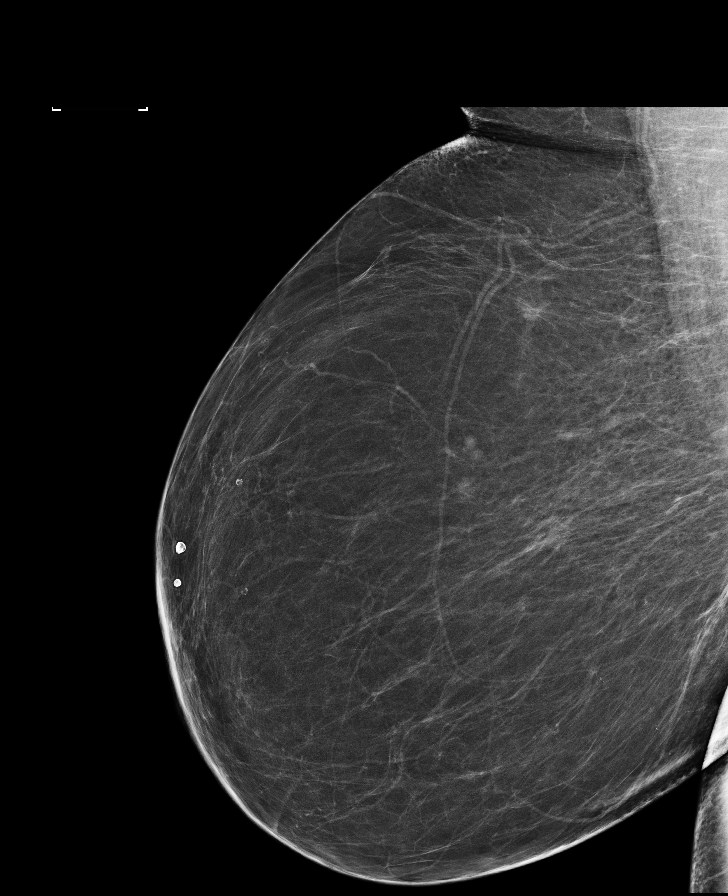

[R XCCL]
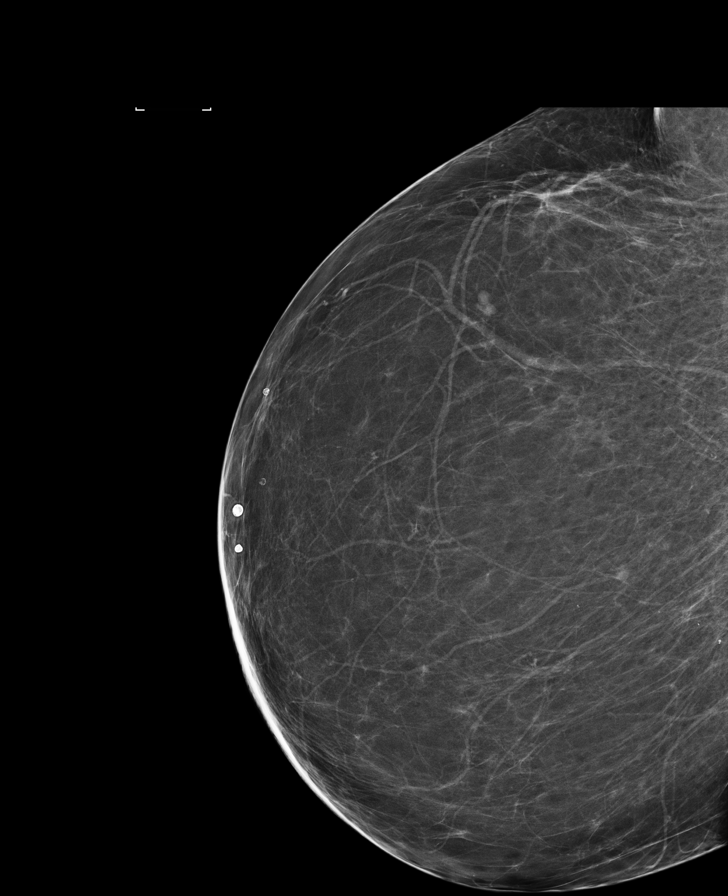

[L MLO]
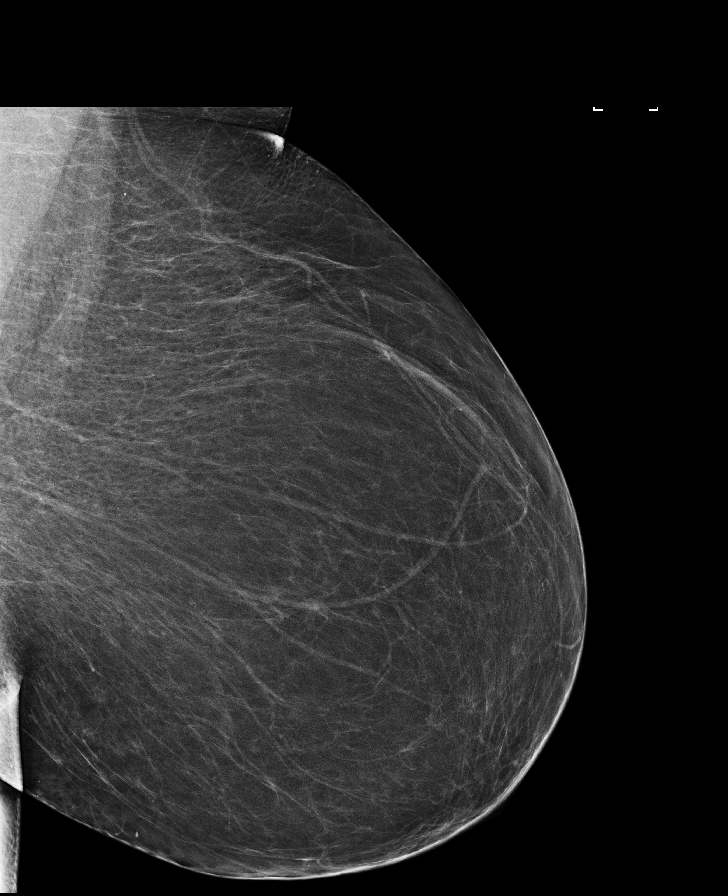

[R CC]
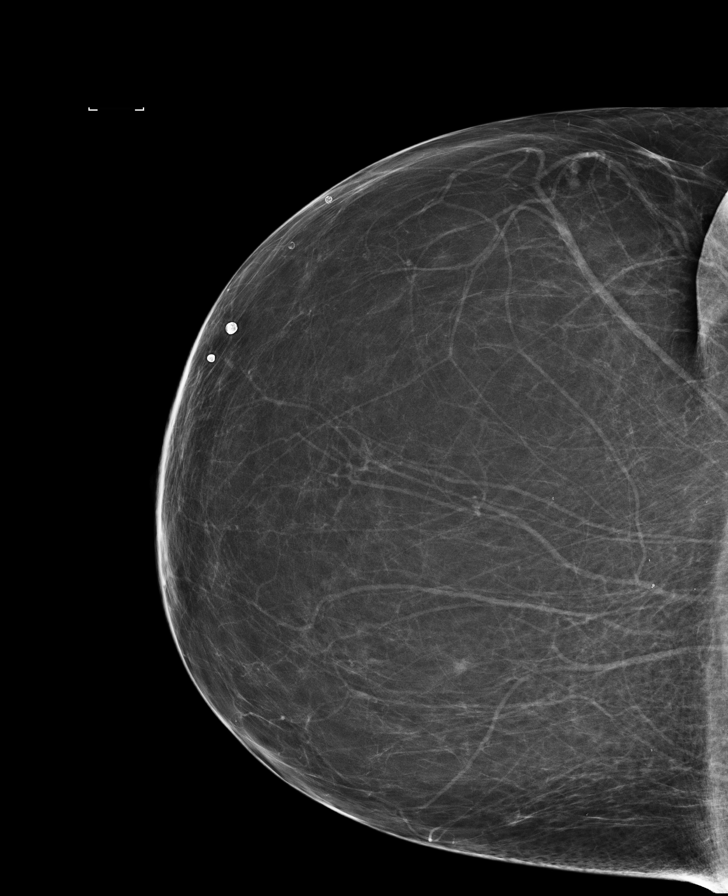

[R MLO (2 of 2)]
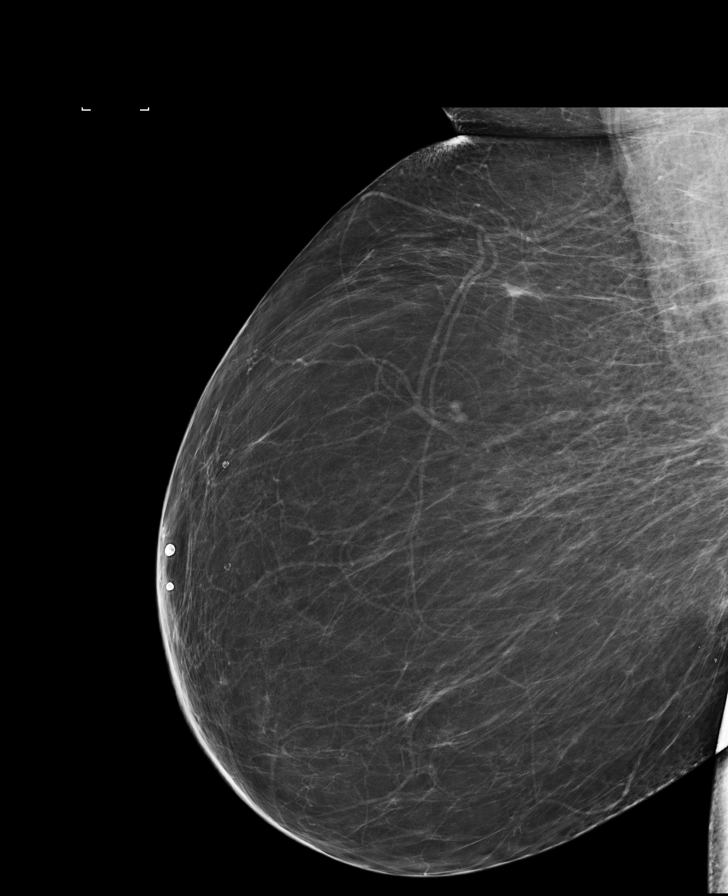

[L CC synth-2D]
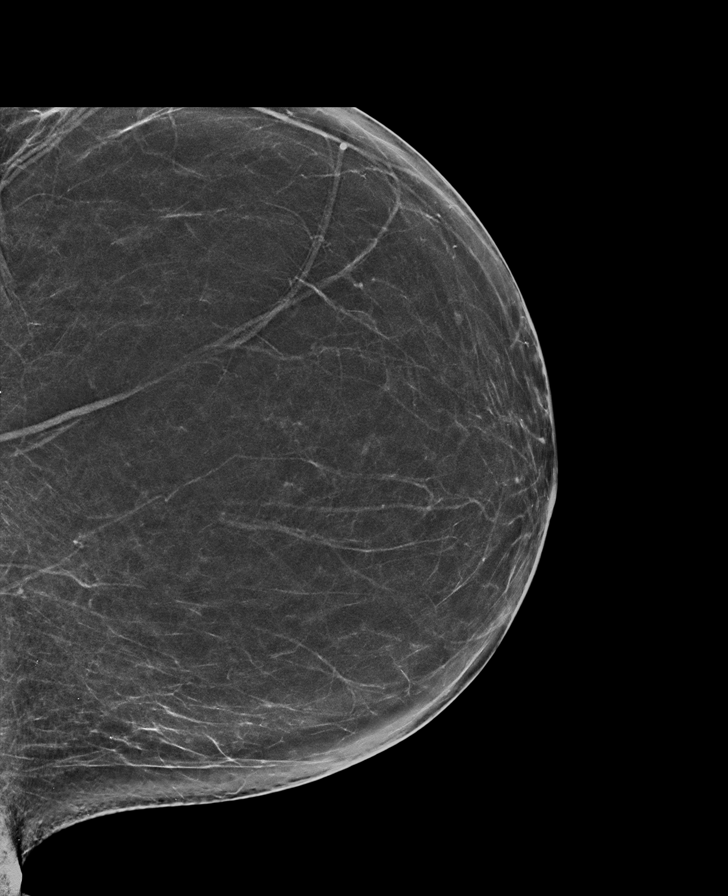

[R MLO synth-2D]
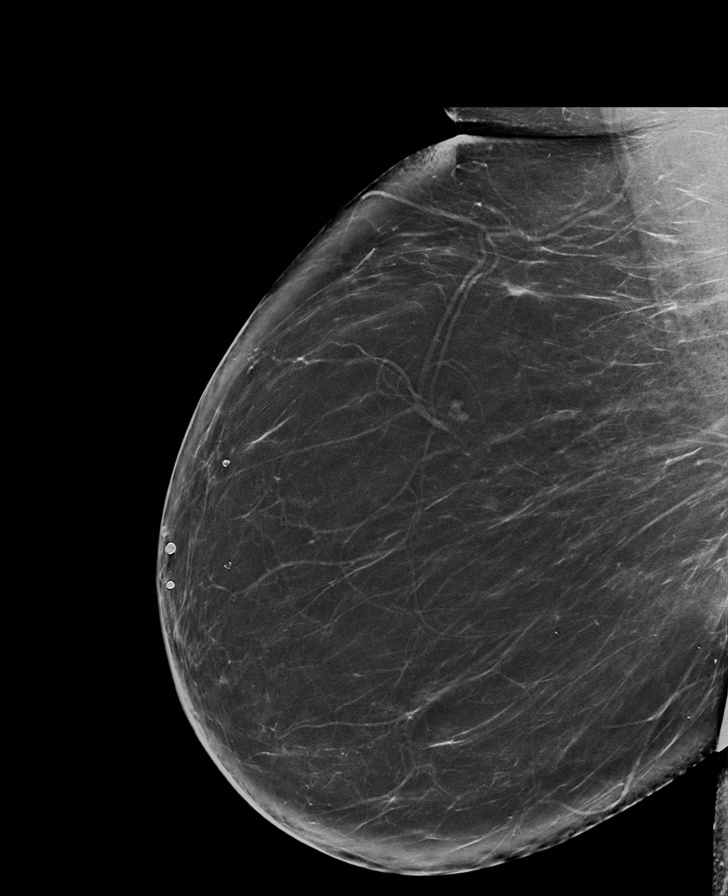

[L MLO synth-2D]
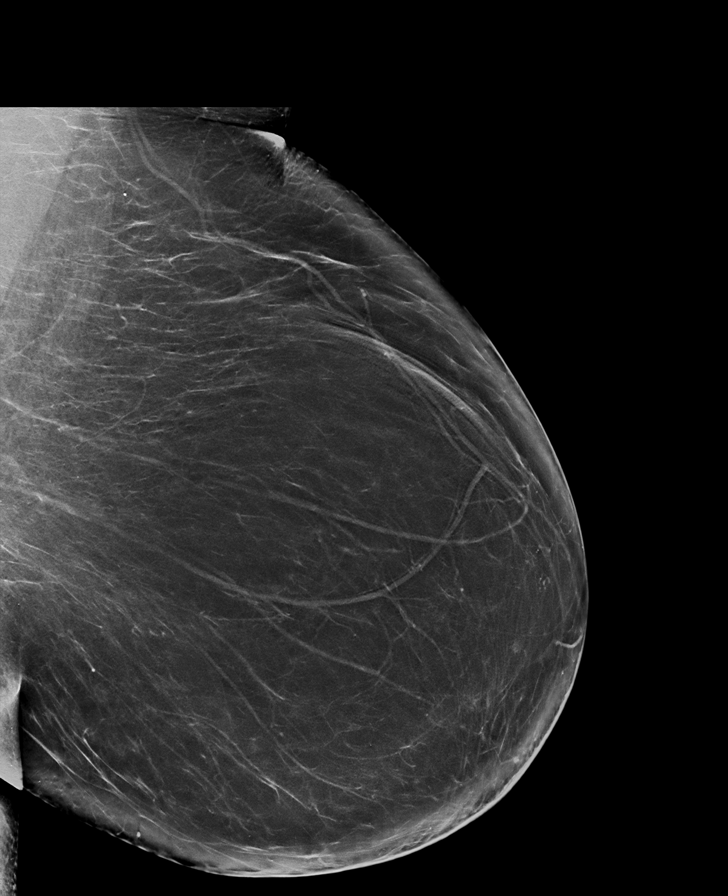

[8 of 34 positions shown; findings below may reference images not displayed]

FINDINGS: In the right breast, a possible asymmetry warrants further
evaluation. In the left breast, no findings suspicious for
malignancy. Images were processed with CAD.
IMPRESSION: Further evaluation is suggested for possible asymmetry in the right
breast.

RECOMMENDATION:
Diagnostic mammogram and possibly ultrasound of the right breast.
(Code:[id number]-[id number])

The patient will be contacted regarding the findings, and additional
imaging will be scheduled.

BI-RADS CATEGORY  0: Incomplete. Need additional imaging evaluation
and/or prior mammograms for comparison.

## 2019-05-10 ENCOUNTER — Encounter: Payer: Medicaid Other | Admitting: Occupational Therapy

## 2019-05-10 ENCOUNTER — Ambulatory Visit: Payer: Medicaid Other | Admitting: Physical Therapy

## 2019-05-10 DIAGNOSIS — G4733 Obstructive sleep apnea (adult) (pediatric): Secondary | ICD-10-CM | POA: Diagnosis not present

## 2019-05-16 ENCOUNTER — Ambulatory Visit: Payer: Medicaid Other

## 2019-05-17 ENCOUNTER — Ambulatory Visit: Payer: Medicaid Other | Admitting: Physical Therapy

## 2019-05-17 ENCOUNTER — Encounter: Payer: Medicaid Other | Admitting: Occupational Therapy

## 2019-05-18 ENCOUNTER — Ambulatory Visit: Payer: Medicaid Other

## 2019-05-19 ENCOUNTER — Telehealth: Payer: Self-pay

## 2019-05-23 ENCOUNTER — Telehealth: Payer: Self-pay | Admitting: General Practice

## 2019-05-23 ENCOUNTER — Ambulatory Visit: Payer: Self-pay | Admitting: General Practice

## 2019-05-23 DIAGNOSIS — I10 Essential (primary) hypertension: Secondary | ICD-10-CM

## 2019-05-23 DIAGNOSIS — Z8673 Personal history of transient ischemic attack (TIA), and cerebral infarction without residual deficits: Secondary | ICD-10-CM

## 2019-05-23 DIAGNOSIS — E782 Mixed hyperlipidemia: Secondary | ICD-10-CM

## 2019-05-23 NOTE — Patient Instructions (Signed)
Visit Information  Goals Addressed            This Visit's Progress   . RN-"I need help getting therapy" (pt-stated)       Current Barriers:  [name] Knowledge Deficits related to options for therapy post CVA . Film/video editor.   Nurse Case Manager Clinical Goal(s):  [name] Over the next 120 days, patient will verbalize understanding of plan for outpatient therapy options  Interventions:  . Patient excited about the progress she has made with PT/OT- Patient is continuing to participated and reports good compliance with at home exercises.  . Patient reporting good blood pressure control- B/P today 139/68 . Patient stating she has filled out all forms necessary for patient assistance with Advair and albuterol and returned them. Completed . Evaluated if the patient still has PT and she states she will have PT through July 2021 through [facility]    Patient Self Care Activities:  . Currently UNABLE TO independently perform ADL's including self care activities such as bathing, dressing because of stroke deficits   Plan:  . RNCM will follow up with patient over the next 120 days to ensure continuation of services or options provided by outpatient PT   Please see past updates related to this goal by clicking on the "Past Updates" button in the selected goal      . COMPLETED: RNCM: "I need help with filling out this paperwork" (pt-stated)       Current Barriers: Per the patient on outreach she has Medicaid now . Knowledge Deficits related to requirements for Low Income Subsidy application procedure . Film/video editor.   Nurse Case Manager Clinical Goal(s):  [name] Over the next 30 days, patient will work with Clinic Pharmacist and LCSW to address needs related to financial constraints and medication needs  Interventions:  . Evaluation of current treatment plan related to medication management and financial constraints impacting health outcomes and patient's adherence to plan as established  by provider. . Met with patient in person to provide assistance with navigating LIS application and provided guidance for completion . Advised patient to present application to appropriate involved parties for completion . Collaborated with care management team regarding ongoing needs related to financial constraints affecting health outcomes . Discussed plans with patient for ongoing care management follow up and provided patient with direct contact information for care management team  Patient Self Care Activities:  . Patient verbalizes understanding of plan to complete LIS application and follow up with care management team and provider . Performs ADL's independently . Calls provider office for new concerns or questions . Unable to independently completed needed documentation/paperwork  Initial goal documentation     . RNCM: Chronic Conditions       CARE PLAN ENTRY (see longtitudinal plan of care for additional care plan information)  Current Barriers:  . Chronic Disease Management support, education, and care coordination needs related to HTN, HLD, Pulmonary Disease, and past stroke   Clinical Goal(s) related to HTN, HLD, Pulmonary Disease, and Past stroke  :  Over the next 120 days, patient will:  . Work with the care management team to address educational, disease management, and care coordination needs  . Begin or continue self health monitoring activities as directed today Measure and record blood pressure 2 times per week and adhere to heart healthy diet . Call provider office for new or worsened signs and symptoms Blood pressure findings outside established parameters and New or worsened symptom related to HLD or after affects  of stroke . Call care management team with questions or concerns . Verbalize basic understanding of patient centered plan of care established today  Interventions related to HTN, HLD, Pulmonary Disease, and past stroke :  . Evaluation of current  treatment plans and patient's adherence to plan as established by provider . Assessed patient understanding of disease states . Assessed patient's education and care coordination needs . Provided disease specific education to patient  . Collaborated with appropriate clinical care team members regarding patient needs  Patient Self Care Activities related to HTN, HLD, Pulmonary Disease, and past stroke :  . Patient is unable to independently self-manage chronic health conditions  Initial goal documentation        Patient verbalizes understanding of instructions provided today.   Telephone follow up appointment with care management team member scheduled for: 07-18-2019 at [facility], MSN, [facility] Mobile: [phone]

## 2019-05-23 NOTE — Chronic Care Management (AMB) (Signed)
Care Management   Follow Up Note   05/23/2019 Name: Kristin Solis MRN: [id number] DOB: 1955-08-29  Referred by: [name], NP Reason for referral : Care Coordination (follow up chronic conditions)   Kristin Solis is a 64 y.o. year old female who is a primary care patient of [name], [name], NP. The care management team was consulted for assistance with care management and care coordination needs.    Review of patient status, including review of consultants reports, relevant laboratory and other test results, and collaboration with appropriate care team members and the patient's provider was performed as part of comprehensive patient evaluation and provision of chronic care management services.    SDOH (Social Determinants of Health) assessments performed: Yes See Care Plan activities for detailed interventions related to SDOH)  SDOH Interventions     Most Recent Value  SDOH Interventions  SDOH Interventions for the Following Domains  Physical Activity  Physical Activity Interventions  Other (Comments) [walks with husband and in yard but no structured activity]       Advanced Directives: See Care Plan and Vynca application for related entries.   Goals Addressed            This Visit's Progress    RN-"I need help getting therapy" (pt-stated)       Current Barriers:   Knowledge Deficits related to options for therapy post CVA  Financial Constraints.   Nurse Case Manager Clinical Goal(s):   Over the next 120 days, patient will verbalize understanding of plan for outpatient therapy options  Interventions:   Patient excited about the progress she has made with PT/OT- Patient is continuing to participated and reports good compliance with at home exercises.   Patient reporting good blood pressure control- B/P today 139/68  Patient stating she has filled out all forms necessary for patient assistance with Advair and albuterol and returned them.  Completed  Evaluated if the patient still has PT and she states she will have PT through July 2021 through Medicaid    Patient Self Care Activities:   Currently UNABLE TO independently perform ADL's including self care activities such as bathing, dressing because of stroke deficits   Plan:   RNCM will follow up with patient over the next 120 days to ensure continuation of services or options provided by outpatient PT   Please see past updates related to this goal by clicking on the "Past Updates" button in the selected goal       COMPLETED: RNCM: "I need help with filling out this paperwork" (pt-stated)       Current Barriers: Per the patient on outreach she has Medicaid now  Knowledge Deficits related to requirements for Low Income Subsidy application procedure  Film/video editor.   Nurse Case Manager Clinical Goal(s):   Over the next 30 days, patient will work with Clinic Pharmacist and LCSW to address needs related to financial constraints and medication needs  Interventions:   Evaluation of current treatment plan related to medication management and financial constraints impacting health outcomes and patient's adherence to plan as established by provider.  Met with patient in person to provide assistance with navigating LIS application and provided guidance for completion  Advised patient to present application to appropriate involved parties for completion  Collaborated with care management team regarding ongoing needs related to financial constraints affecting health outcomes  Discussed plans with patient for ongoing care management follow up and provided patient with direct contact information for care management team  Patient Self Care Activities:   Patient verbalizes understanding of plan to complete LIS application and follow up with care management team and provider  Performs ADL's independently  Calls provider office for new concerns or  questions  Unable to independently completed needed documentation/paperwork  Initial goal documentation      RNCM: Chronic Conditions       CARE PLAN ENTRY (see longtitudinal plan of care for additional care plan information)  Current Barriers:   Chronic Disease Management support, education, and care coordination needs related to HTN, HLD, Pulmonary Disease, and past stroke   Clinical Goal(s) related to HTN, HLD, Pulmonary Disease, and Past stroke  :  Over the next 120 days, patient will:   Work with the care management team to address educational, disease management, and care coordination needs   Begin or continue self health monitoring activities as directed today Measure and record blood pressure 2 times per week and adhere to heart healthy diet  Call provider office for new or worsened signs and symptoms Blood pressure findings outside established parameters and New or worsened symptom related to HLD or after affects of stroke  Call care management team with questions or concerns  Verbalize basic understanding of patient centered plan of care established today  Interventions related to HTN, HLD, Pulmonary Disease, and past stroke :   Evaluation of current treatment plans and patient's adherence to plan as established by provider  Assessed patient understanding of disease states  Assessed patient's education and care coordination needs  Provided disease specific education to patient   Collaborated with appropriate clinical care team members regarding patient needs  Patient Self Care Activities related to HTN, HLD, Pulmonary Disease, and past stroke :   Patient is unable to independently self-manage chronic health conditions  Initial goal documentation         Telephone follow up appointment with care management team member scheduled for: 07-18-2019 at 1 pm  Noreene Larsson RN, MSN, [facility] Mobile: [phone]

## 2019-05-24 ENCOUNTER — Other Ambulatory Visit: Payer: Self-pay

## 2019-05-24 ENCOUNTER — Encounter: Payer: Self-pay | Admitting: Adult Health

## 2019-05-24 ENCOUNTER — Ambulatory Visit: Payer: Medicaid Other | Admitting: Adult Health

## 2019-05-24 VITALS — BP 146/66 | HR 70 | Temp 97.3°F | Ht 63.0 in | Wt 210.0 lb

## 2019-05-24 DIAGNOSIS — I10 Essential (primary) hypertension: Secondary | ICD-10-CM

## 2019-05-24 DIAGNOSIS — E785 Hyperlipidemia, unspecified: Secondary | ICD-10-CM

## 2019-05-24 DIAGNOSIS — Z9989 Dependence on other enabling machines and devices: Secondary | ICD-10-CM | POA: Diagnosis not present

## 2019-05-24 DIAGNOSIS — M7501 Adhesive capsulitis of right shoulder: Secondary | ICD-10-CM

## 2019-05-24 DIAGNOSIS — G4733 Obstructive sleep apnea (adult) (pediatric): Secondary | ICD-10-CM | POA: Diagnosis not present

## 2019-05-24 DIAGNOSIS — I63512 Cerebral infarction due to unspecified occlusion or stenosis of left middle cerebral artery: Secondary | ICD-10-CM

## 2019-05-24 NOTE — Progress Notes (Signed)
[facility] [address] [place]. [place] [address] [phone]       OFFICE FOLLOW UP NOTE  [name] Date of Birth:  August 06, 1955 Medical Record Number:  [id number]   Reason for Referral: stroke follow up    CHIEF COMPLAINT:  Chief Complaint  Patient presents with  . Follow-up    treatment rm, alone, hx of stroke, pt states she feels great    HPI:  [name] is a 64 year old female who is being seen today, 04/23/2019, for stroke follow-up.  She has been doing well from a stroke standpoint with residual mild right hemiparesis but overall greatly improving.  She continues to work with outpatient therapies.  She does endorse ongoing right shoulder pain that limits range of motion but is hopeful that ongoing therapy will be beneficial.  Repeat carotid ultrasound on 12/01/2018 showed left ICA stent less than 50% stenosis and 1 to 39% right ICA stenosis.  Brilinta discontinued as 6 months post stent placement and continues on aspirin alone without bleeding or bruising.  Continues on atorvastatin 40 mg daily myalgias.  Reports that recent lab work by PCP with lipid panel satisfactory.  Blood pressure today 146/66.  No further concerns at this time.     History: Provided for reference purposes only Stroke admission 04/17/2018: [name] a 64 y.o.femalewith history of asthma and HTN who presented to [facility] with dysphasia and R HP which occurred while talking about a traumatic event. Symptoms completely resolved prior to ED. While in the ED, she began having difficulty speaking again along with dense right hemiparesis. She receivedtPA 04/17/2018 at 2032.CTA showed a dense LM1 occlusion with large ischemic penumbra on perfusion and CTA neck showed a left ICA embolus. She was transferred to [facility] and sent to IR for mechanical thrombectomy where she had TICI 3 reperfusion using ambo trap and stent assisted angioplasty of severely stenotic L ICA at the  bulb.  Post CT head negative for ICH, mass-effect or midline shift.  MRI brain reviewed and showed multifocal acute left MCA territory infarcts and anterior/posterior border zone infarcts with scattered petechial hemorrhage secondary to large vessel disease.  2D echo unremarkable without cardiac source of embolism identified.  Initiated aspirin 81 mg and Brilinta post stent placement and secondary stroke prevention.  Initially found to be in hypertensive emergency and treated with Cleviprex which stabilized throughout admission and recommended long-term BP goal normotensive range.  LDL 102 and initiated atorvastatin 40 mg daily.  Diagnosed with OSA during inpatient and CPAP device supplied at discharge.  Other stroke risk factors include obesity, prior stroke per imaging and family history of stroke.  She had residual deficits of right hemiparesis but no residual speech difficulties and discharged home in stable condition with recommendations of home health PT/OT.  Initial visit 06/06/2018 JM: she continues to have residual deficits of right hemiparesis but does endorse improvement and endorses resolution of speech difficulty.  She did not receive any home health therapies as per patient, her insurance did not cover this.  She has been doing exercises on her own at home.  She has been able to continue ADLs independently but has required assistance with IADLs.  She endorses difficulty writing as she is right-hand dominant.  She is able to ambulate without assistive device within her home but will use a cane or rolling walker for long distance.  During conversation of ongoing deficits and difficulties, patient becomes tearful.  Discussion regarding potential post stroke depression with  PHQ 2 score 0.  She denies underlying history of depression or anxiety.  She continues to be motivated for ongoing improvement but becomes upset when she attempts to try to do something with her right side and she is unable to or she  speaks about it.  She continues on aspirin and Brilinta without side effects of bleeding or bruising.  Continues on atorvastatin without side effects of myalgias.  Blood pressure monitored at home and typically ranges 120s/70s.  She continues to be compliant with CPAP for OSA management.  She has not scheduled appointment at this time with vascular surgery as recommended at 4 weeks post discharge.  No further concerns at this time.  Denies new or worsening stroke/TIA symptoms.  Update 11/23/2018 Dr. Leonie Man : She returns for follow-up after last visit nearly 6 months ago.  She is accompanied by her husband.  She is doing well and states that her speech difficulties have recovered almost back to baseline.  She occasionally has some hesitation.  She is also regained strength on the right side with the right arm particularly the hand is still weak.  She is able to ambulate independently indoors and not short distances but does use a walker for outdoors and long distances.  She has had no falls or injuries.  She recently got Medicaid and will be starting outpatient physical and occupational therapy in [place] soon.  She is tolerating aspirin and Brilinta but does has easy bruising.  She has not had any follow-up carotid ultrasound done to image her carotid stent.  She states her blood pressure is under good control and today it is 137/77.  She remains on Lipitor which she is tolerating well without muscle aches and pains and states her primary care physician did check lipid profile a few months ago and it was satisfactory.  She is eating healthy and has lost weight steadily.  She is also active.  She has no new complaints.       ROS:   14 system review of systems performed and negative with exception of weakness and joint pain  PMH:  Past Medical History:  Diagnosis Date  . Asthma   . Hypertension   . Stroke [facility])     PSH:  Past Surgical History:  Procedure Laterality Date  . ABDOMINAL  HYSTERECTOMY  2005   total  . IR CT HEAD LTD  04/18/2018  . IR INTRAVSC STENT CERV CAROTID W/O EMB-PROT MOD SED INC ANGIO  04/18/2018  . IR PERCUTANEOUS ART THROMBECTOMY/INFUSION INTRACRANIAL INC DIAG ANGIO  04/18/2018  . OOPHORECTOMY    . RADIOLOGY WITH ANESTHESIA N/A 04/17/2018   Procedure: RADIOLOGY WITH ANESTHESIA;  Surgeon: Luanne Bras, MD;  Location: [facility];  Service: Radiology;  Laterality: N/A;    Social History:  Social History   Socioeconomic History  . Marital status: Married    Spouse name: Not on file  . Number of children: Not on file  . Years of education: Not on file  . Highest education level: Not on file  Occupational History  . Not on file  Tobacco Use  . Smoking status: Never Smoker  . Smokeless tobacco: Never Used  Substance and Sexual Activity  . Alcohol use: No  . Drug use: No  . Sexual activity: Never  Other Topics Concern  . Not on file  Social History Narrative  . Not on file   Social Determinants of Health   Financial Resource Strain: Low Risk   . Difficulty of Paying  Living Expenses: Not hard at all  Food Insecurity: No Food Insecurity  . Worried About Programme researcher, broadcasting/film/video in the Last Year: Never true  . Ran Out of Food in the Last Year: Never true  Transportation Needs: No Transportation Needs  . Lack of Transportation (Medical): No  . Lack of Transportation (Non-Medical): No  Physical Activity: Inactive  . Days of Exercise per Week: 0 days  . Minutes of Exercise per Session: 0 min  Stress: No Stress Concern Present  . Feeling of Stress : Only a little  Social Connections: Somewhat Isolated  . Frequency of Communication with Friends and Family: More than three times a week  . Frequency of Social Gatherings with Friends and Family: More than three times a week  . Attends Religious Services: Never  . Active Member of Clubs or Organizations: No  . Attends Banker Meetings: Never  . Marital Status: Married  Catering manager  Violence: Not At Risk  . Fear of Current or Ex-Partner: No  . Emotionally Abused: No  . Physically Abused: No  . Sexually Abused: No    Family History:  Family History  Problem Relation Age of Onset  . Cancer Mother        lung  . Cancer Father        brain  . Diabetes Sister   . Alcohol abuse Brother   . Stroke Son        x2  . CAD Maternal Grandmother   . Emphysema Paternal Grandfather   . Heart disease Sister        CHF    Medications:   Current Outpatient Medications on File Prior to Visit  Medication Sig Dispense Refill  . albuterol (VENTOLIN HFA) 108 (90 Base) MCG/ACT inhaler Inhale 2 puffs into the lungs every 6 (six) hours as needed for wheezing or shortness of breath. 16 g 3  . aspirin EC 81 MG EC tablet Take 1 tablet (81 mg total) by mouth daily.    [name] atorvastatin (LIPITOR) 40 MG tablet Take 1 tablet (40 mg total) by mouth daily at 6 PM. 30 tablet 11  . fluticasone (FLONASE) 50 MCG/ACT nasal spray Place 2 sprays into both nostrils daily. 48 g 3  . Fluticasone-Salmeterol (ADVAIR DISKUS) 250-50 MCG/DOSE AEPB Inhale 1 puff into the lungs 2 (two) times daily. 240 each 3  . lisinopril (ZESTRIL) 5 MG tablet Take 1 tablet (5 mg total) by mouth daily. 90 tablet 3   No current facility-administered medications on file prior to visit.    Allergies:  No Known Allergies   Physical Exam  Vitals:   05/24/19 1430  BP: (!) 146/66  Pulse: 70  Temp: (!) 97.3 F (36.3 C)  Weight: 210 lb (95.3 kg)  Height: 5\' 3"  (1.6 m)   Body mass index is 37.2 kg/m. No exam data present  General: Obese pleasant middle-age Caucasian female, seated, in no evident distress Head: head normocephalic and atraumatic.   Neck: supple with no carotid or supraclavicular bruits Cardiovascular: regular rate and rhythm, no murmurs Musculoskeletal: no deformity Skin:  no rash/petichiae Vascular:  Normal pulses all extremities   Neurologic Exam Mental Status: Awake and fully alert.   Normal  speech and language.  Oriented to place and time. Recent and remote memory intact. Attention span, concentration and fund of knowledge appropriate. Mood and affect appropriate.  Cranial Nerves: Pupils equal, briskly reactive to light. Extraocular movements full without nystagmus. Visual fields full to confrontation. Hearing intact.  Facial sensation intact. Face, tongue, palate moves normally and symmetrically.  Motor: Normal bulk and tone.  Full strength left upper and lower extremity RUE: 4/5 with decreased hand strength and limited shoulder ROM due to increased pain RLE: 4+/5 Sensory.: intact to touch , pinprick , position and vibratory sensation.  Coordination: Rapid alternating movements normal in all extremities except decreased right hand. Finger-to-nose and heel-to-shin performed accurately bilaterally. Gait and Station: Arises from chair without difficulty. Stance is normal. Gait demonstrates normal stride length and balance with use of cane Reflexes: 1+ and symmetric. Toes downgoing.       ASSESSMENT: DAVINIA RICCARDI is a 64 y.o. year old female here with left MCA infarct status post TPA with TICI 3 reperfusion left ICA rescue stent on 04/17/2018 secondary to large vessel disease extracranial carotid stenosis. Vascular risk factors include HTN, HLD, new diagnosis of OSA, carotid stenosis and obesity.  Repeat carotid ultrasound stable.  She continues to do well from a stroke standpoint with residual mild right hemiparesis as well as likely adhesive capsulitis of right shoulder    PLAN:  1. Left MCA infarct:  -Ongoing participation in outpatient therapies with ongoing improvement.  Discussion regarding further intervention of right shoulder pain likely adhesive capsulitis limiting mobility but patient declines interest at this time.  Advised to call office in the future if interested in pursuing and referral will be sent to physical medicine and rehab for further evaluation -Continue  aspirin 81 mg daily  and atorvastatin for secondary stroke prevention.  -Maintain strict control of hypertension with blood pressure goal below 130/90, diabetes with hemoglobin A1c goal below 6.5% and cholesterol with LDL cholesterol (bad cholesterol) goal below 70 mg/dL.  I also advised the patient to eat a healthy diet with plenty of whole grains, cereals, fruits and vegetables, exercise regularly with at least 30 minutes of continuous activity daily and maintain ideal body weight. 2. HTN: Advised to continue current treatment regimen.  Advised to continue to monitor at home along with continued follow-up with PCP for management 3. HLD: Advised to continue current treatment regimen along with continued follow-up with PCP for future prescribing and monitoring of lipid panel 4. OSA: Continue ongoing compliance with CPAP    Follow up in 6 months or call earlier if needed   I spent 22 minutes of face-to-face and non-face-to-face time with patient.  This included previsit chart review, lab review, study review, order entry, electronic health record documentation, patient education    Ihor Austin, [facility]  [facility] [address] [place], [place] [address]  Phone [phone] Fax [phone] Note: This document was prepared with digital dictation and possible smart phrase technology. Any transcriptional errors that result from this process are unintentional.

## 2019-05-24 NOTE — Progress Notes (Signed)
I agree with the above plan 

## 2019-05-24 NOTE — Patient Instructions (Signed)
Continue to participate in outpatient therapies for hopeful ongoing improvement.  If shoulder pain worsens, please call office for further intervention options.  May try over-the-counter topical creams such as Voltaren (diclofenac) gel for possible benefit  Continue aspirin 81 mg daily  and atorvastatin 40 mg daily for secondary stroke prevention  Continue to follow up with PCP regarding cholesterol and blood pressure management   Continue to monitor blood pressure at home  Maintain strict control of hypertension with blood pressure goal below 130/90, diabetes with hemoglobin A1c goal below 6.5% and cholesterol with LDL cholesterol (bad cholesterol) goal below 70 mg/dL. I also advised the patient to eat a healthy diet with plenty of whole grains, cereals, fruits and vegetables, exercise regularly and maintain ideal body weight.  Followup in the future with me in 6 months or call earlier if needed       Thank you for coming to see [place] at [facility]. I hope we have been able to provide you high quality care today.  You may receive a patient satisfaction survey over the next few weeks. We would appreciate your feedback and comments so that we may continue to improve ourselves and the health of our patients.

## 2019-05-29 ENCOUNTER — Ambulatory Visit: Payer: Self-pay | Admitting: Licensed Clinical Social Worker

## 2019-05-29 NOTE — Chronic Care Management (AMB) (Signed)
  Care Management   Follow Up Note   05/29/2019 Name: Kristin Solis MRN: [id number] DOB: 09/05/1955  Referred by: [name], NP Reason for referral : Care Coordination   Kristin Solis is a 64 y.o. year old female who is a primary care patient of [name], [name], NP. The care management team was consulted for assistance with care management and care coordination needs.    Review of patient status, including review of consultants reports, relevant laboratory and other test results, and collaboration with appropriate care team members and the patient's provider was performed as part of comprehensive patient evaluation and provision of chronic care management services.    SDOH (Social Determinants of Health) assessments performed: Yes See Care Plan activities for detailed interventions related to [facility])     Advanced Directives: See Care Plan and Vynca application for related entries.   Goals Addressed    . SW-"I need to to gain more support and cope better with daily stressors" (pt-stated)       Current Barriers:  . Financial constraints . ADL IADL limitations . Mental Health Concerns  . Limited education about Medicaid benefits  . Memory Deficits . Inability to perform ADL's independently . Inability to perform IADL's independently  Clinical Social Work Clinical Goal(s):  [name] Over the next 90 days, client will work with SW to address concerns related to gaining financial assistance education and getting enrolled in social security.  . Over the next 90 days, client will follow up with DSS Medicaid caseworker* as directed by SW  Interventions: . Patient interviewed and appropriate assessments performed . Provided mental health counseling with regard to patient was emotional throughout phone call due to her daily difficulty with mobility/weakness/balance. LCSW provided reflective listening and implemented appropriate interventions to help suppport patient and her emotional  needs  . Patient reports that she was only approved for Medicaid [employer] but that she would be able to get Full Adult Medicaid if she were deemed disabled.  . CSW used motivational interviewing techniques to engage pt in meaningful conversation and to assess pt's wilingess to implement appropriate self care daily to combat stressors/anxiety. CSW explored pt's coping skills and access to community services. Patient reports a decrease in her overall anxiety/stress/depression . Patient reports ongoing active communication between her and her Medicaid caseworker.  . Discussed plans with patient for ongoing care management follow up and provided patient with direct contact information for care management team . Patient continues to receive $200 in confirms in food stamps per month . Assisted patient with obtaining information about health plan benefits . Patient confirms spouse provides ongoing caregiver support in the home. . Patient denies any current social work needs at this time. Positive reinforcement provided for ongoing healthy self-care implementation. Patient denies having mental health issues as well. Patient is agreeable to contact LCSW if future social work needs arise.   Patient Self Care Activities:  . Attends all scheduled provider appointments . Calls provider office for new concerns or questions  Please see past updates related to this goal by clicking on the "Past Updates" button in the selected goal      Dickie La, BSW, MSW, LCSW [facility] Care [facility] [place]  [facility] [email or web address] Phone: [phone]

## 2019-06-06 ENCOUNTER — Telehealth: Payer: Self-pay

## 2019-06-14 ENCOUNTER — Encounter: Payer: Self-pay | Admitting: Physical Therapy

## 2019-06-14 ENCOUNTER — Ambulatory Visit: Payer: Medicaid Other | Attending: Nurse Practitioner | Admitting: Physical Therapy

## 2019-06-14 ENCOUNTER — Ambulatory Visit: Payer: Medicaid Other | Admitting: Occupational Therapy

## 2019-06-14 ENCOUNTER — Other Ambulatory Visit: Payer: Self-pay

## 2019-06-14 DIAGNOSIS — R2689 Other abnormalities of gait and mobility: Secondary | ICD-10-CM

## 2019-06-14 DIAGNOSIS — R600 Localized edema: Secondary | ICD-10-CM | POA: Insufficient documentation

## 2019-06-14 DIAGNOSIS — R278 Other lack of coordination: Secondary | ICD-10-CM

## 2019-06-14 DIAGNOSIS — R2681 Unsteadiness on feet: Secondary | ICD-10-CM | POA: Diagnosis not present

## 2019-06-14 DIAGNOSIS — I69351 Hemiplegia and hemiparesis following cerebral infarction affecting right dominant side: Secondary | ICD-10-CM | POA: Diagnosis not present

## 2019-06-14 DIAGNOSIS — M6281 Muscle weakness (generalized): Secondary | ICD-10-CM | POA: Insufficient documentation

## 2019-06-14 DIAGNOSIS — Z8673 Personal history of transient ischemic attack (TIA), and cerebral infarction without residual deficits: Secondary | ICD-10-CM | POA: Diagnosis not present

## 2019-06-14 NOTE — Therapy (Signed)
[place] MAIN [facility] SERVICES [address] [place], [place], [address] Phone: [phone]   Fax:  [phone]  Physical Therapy Treatment  Patient Details  Name: Kristin Solis MRN: [id number] Date of Birth: [birth year] Referring Provider (PT): [name]   Encounter Date: 06/14/2019  PT End of Session - 06/14/19 1356    Visit Number  12    Number of Visits  25    Date for PT Re-Evaluation  06/14/19    Authorization Type  eval 10/27    Authorization Time Period  approved 3 visist 11/4-11/13, 8 visits aproved ( 01/02/19-12/13), 3 visits approved 04/05/2019-04/25/2019: 1 visit approved until 06/27/19    PT Start Time  1400    PT Stop Time  1500    PT Time Calculation (min)  60 min    Equipment Utilized During Treatment  Gait belt    Activity Tolerance  Patient tolerated treatment well;No increased pain;Patient limited by fatigue    Behavior During Therapy  [facility] for tasks assessed/performed       Past Medical History:  Diagnosis Date  . Asthma   . Hypertension   . Stroke [facility])     Past Surgical History:  Procedure Laterality Date  . ABDOMINAL HYSTERECTOMY  2005   total  . IR CT HEAD LTD  04/18/2018  . IR INTRAVSC STENT CERV CAROTID W/O EMB-PROT MOD SED INC ANGIO  04/18/2018  . IR PERCUTANEOUS ART THROMBECTOMY/INFUSION INTRACRANIAL INC DIAG ANGIO  04/18/2018  . OOPHORECTOMY    . RADIOLOGY WITH ANESTHESIA N/A 04/17/2018   Procedure: RADIOLOGY WITH ANESTHESIA;  Surgeon: Luanne Bras, MD;  Location: [facility];  Service: Radiology;  Laterality: N/A;    There were no vitals filed for this visit.  Subjective Assessment - 06/14/19 1355    Subjective  Pt doing well today, reports that she is writing better and walking each day.    Patient is accompained by:  Family member    Pertinent History  Patient is a 64 y.o. female with history of stroke [facility]. She was initially admitted to ED on  04/17/18 with dysphagia and right sided weakness s/p left MCA CVA, extubated on 04/19/18 and discharged on 04/23/18 to private residence. Was independent with ADLs/IADLs prior to admission. Inpatient PT reported decreased safety awareness and awareness of deficits, with deficits in the areas of balance, ambulation ability, transfers. Was able to ambulate 125 feet in [facility] with min guard without AD, and independent with bed mobility.    Limitations  Lifting;Standing;Walking;Writing;House hold activities    How long can you sit comfortably?  As long as desired    How long can you stand comfortably?  5 minutes    How long can you walk comfortably?  Able to grocery shop, requires a seated rest break for a couple of minutes.    Patient Stated Goals  Wants to be able to use right arm and hand, and to feel more confortable with going shopping with her husband.    Currently in Pain?  No/denies    Pain Score  0-No pain    Pain Onset  Today      Treatment: Patient was seen for re-assessment and review of goals. Patient performs FGA, 6 MW and strength testing.   Nu-step x 5 mins  Standing hip abd x 15 x 2 BLE Standing hip extension x 15 x 2 BLE Heel raises x 20  Leg press 40 lbs x  3 sets  Patient performed with instruction, verbal cues, tactile cues of therapist: goal: increase tissue extensibility, promote proper posture, improve mobility                         PT Education - 06/14/19 1355    Education Details  plan of care    Person(s) Educated  Patient    Methods  Explanation    Comprehension  Verbalized understanding       PT Short Term Goals - 01/18/19 1408      PT SHORT TERM GOAL #1   Title  Patient will be independent in initial home exercise program to improve strength/mobility for better functional independence with ADLs.    Baseline  Not made yet., 12/28/18: given a HEP but still needs cues for proper technique    Time  6    Period  Weeks    Status  Partially  Met    Target Date  01/24/19      PT SHORT TERM GOAL #2   Title  Patient (> 98 years old) will complete five times sit to stand test in < 15 seconds indicating an increased LE strength and improved balance for decreased falls risk.    Baseline  12/13/18: 17.52 sec; 12/28/18: 13.53 sec without HHA    Time  6    Period  Weeks    Status  Achieved    Target Date  01/24/19        PT Long Term Goals - 06/14/19 1357      PT LONG TERM GOAL #1   Title  Patient will increase 10 meter walk test to >1.53ms as to improve gait speed for better community ambulation and to reduce fall risk.    Baseline  12/13/18: 0.64 m/s with rollator, 0.72 m/s without AD; 12/28/18: 0.84 m/s without AD 11/25: 1.0 m/s without AD:06/14/19=    Time  12    Period  Weeks    Status  Achieved      PT LONG TERM GOAL #2   Title  Patient will increase six minute walk test distance to >1000 for progression to community ambulator and improve gait ability.    Baseline  12/13/18: 610 ft with rollator; 12/28/18: deferred 11/25: 723 ft without an AD, 01/18/19 640  03/29/19=deferred due to TM walking x 6 mins speed .03 miles / hour4/28/21=780 ft    Time  12    Period  Weeks    Status  Partially Met    Target Date  06/14/19      PT LONG TERM GOAL #3   Title  Patient will be require no assist with ascend/descend 3 steps using Least restrictive assistive device without LOB to improve ability to get in/out of home.    Baseline  Dependent on rail assist to reach front door. 12/28/18: uses rail, one step at a time 11/25: able to ascend/descend step over step with BUE support, able to perform step to pattern with SUE support12/2/20=able to perform step to pattern with SUE support12    03/29/19= Able to perform step over step with less support with one rail support4/28/21=step over step with rail and without railing    Time  12    Period  Weeks    Status  Partially Met    Target Date  06/14/19      PT LONG TERM GOAL #4   Title  Patient  will increase BLE gross strength to 4+/5 as  to improve functional strength for independent gait, increased standing tolerance and increased ADL ability.    Baseline  12/13/18: RLE hip weakness; 12/28/18: RLE hip extension: 2+/5, hip abduction/adduction 3-/5, knee grossly 4-/5 11/25: RLE hip extension 3-/5 add/abd 3/5 knee 4/512/2/20=RLE hip extension 3-/5 add/abd 3/5 knee 4/5   03/29/19= RLE hip flex /abd 3+/5, hip extension 3+/5, R knee 4+/54/28/21= RLE hip flex /abd 3+/5, hip extension 3+/5, R knee 4+/5    Time  12    Period  Weeks    Status  Partially Met    Target Date  06/14/19      PT LONG TERM GOAL #5   Title  Patient will increase Functional Gait Assessment score to >20/30 as to reduce fall risk and improve dynamic gait safety with community ambulation.    Baseline  12/28/18: 13/24, 01/18/19 13/30, 03/29/19 = 15/30, 06/14/19=17/30    Time  12    Period  Weeks    Status  Partially Met    Target Date  06/14/19            Pt was able to perform all exercises today with CGA. Patient performed outcome measures for reviewing goals. . Pt was able to perform all balance and strength exercises, demonstrating improvements in LE strength and stability.  Pt was able to complete  Exercises,with cues for correct technique.. Pt requires verbal, visual and tactile cues during exercise in order to complete tasks with proper form and technique, as well as to stay on task.  Pt was educated in [facility] [place] and will be DC from PT.   Plan - 06/14/19 1506    Personal Factors and Comorbidities  Age;Comorbidity 2;Fitness;Time since onset of injury/illness/exacerbation    Examination-Activity Limitations  Bathing;Bed Mobility;Bend;Carry;Dressing;Lift;Locomotion Level;Reach Overhead;Squat;Stairs;Stand;Transfers    Examination-Participation Restrictions  Cleaning;Community Activity;Driving;Laundry;Shop;Yard Work    Rehab Potential  Good    PT Frequency  2x / week    PT Duration  12 weeks    PT Treatment/Interventions   ADLs/Self Care Home Management;Aquatic Therapy;Cryotherapy;Electrical Stimulation;Moist Heat;DME Instruction;Gait training;Stair training;Functional mobility training;Therapeutic activities;Therapeutic exercise;Balance training;Neuromuscular re-education;Patient/family education;Orthotic Fit/Training;Manual techniques;Compression bandaging;Passive range of motion;Dry needling;Energy conservation;Taping    PT Next Visit Plan  review HEP, balance    PT Home Exercise Plan  No updates this date    Consulted and Agree with Plan of Care  Patient       Patient will benefit from skilled therapeutic intervention in order to improve the following deficits and impairments:  Abnormal gait, Decreased balance, Decreased endurance, Decreased mobility, Difficulty walking, Hypomobility, Decreased range of motion, Improper body mechanics, Decreased activity tolerance, Decreased strength, Impaired UE functional use, Postural dysfunction  Visit Diagnosis: Muscle weakness (generalized)  Other lack of coordination  Other abnormalities of gait and mobility  Hemiplegia and hemiparesis following cerebral infarction affecting right dominant side (HCC)  Localized edema  History of stroke  Unsteadiness on feet     Problem List Patient Active Problem List   Diagnosis Date Noted  . Mixed hyperlipidemia 09/28/2018  . Middle cerebral artery embolism, left 04/18/2018  . History of stroke 04/17/2018  . IFG (impaired fasting glucose) 03/28/2018  . Essential hypertension, benign 01/15/2017  . Asthma 01/15/2017  . Obstructive sleep apnea 10/18/2013    Alanson Puls, [place] DPT 06/14/2019, 3:12 PM  [place] MAIN [facility] SERVICES [address] [place], [place], [address] Phone: [phone]   Fax:  [phone]  Name: CHANTAL WORTHEY MRN: [id number] Date of Birth: [birth year]

## 2019-06-15 ENCOUNTER — Encounter: Payer: Self-pay | Admitting: Occupational Therapy

## 2019-06-15 NOTE — Addendum Note (Signed)
Addended by: Avon Gully on: 06/15/2019 11:14 AM   Modules accepted: Orders

## 2019-06-15 NOTE — Therapy (Signed)
[place] MAIN [facility] SERVICES [address] [place], [place], [address] Phone: [phone]   Fax:  [phone]  Occupational Therapy Treatment  Patient Details  Name: Kristin Solis MRN: [id number] Date of Birth: Jul 29, 1955 Referring Provider (OT): Marnee Guarneri   Encounter Date: 06/14/2019  OT End of Session - 06/15/19 1936    Visit Number  25    Number of Visits  37    Date for OT Re-Evaluation  07/11/19    Authorization Type  Progress report period starting 03/21/2019    OT Start Time  1300    OT Stop Time  1400    OT Time Calculation (min)  60 min    Activity Tolerance  Patient tolerated treatment well    Behavior During Therapy  [facility] for tasks assessed/performed       Past Medical History:  Diagnosis Date  . Asthma   . Hypertension   . Stroke [facility])     Past Surgical History:  Procedure Laterality Date  . ABDOMINAL HYSTERECTOMY  2005   total  . IR CT HEAD LTD  04/18/2018  . IR INTRAVSC STENT CERV CAROTID W/O EMB-PROT MOD SED INC ANGIO  04/18/2018  . IR PERCUTANEOUS ART THROMBECTOMY/INFUSION INTRACRANIAL INC DIAG ANGIO  04/18/2018  . OOPHORECTOMY    . RADIOLOGY WITH ANESTHESIA N/A 04/17/2018   Procedure: RADIOLOGY WITH ANESTHESIA;  Surgeon: Luanne Bras, MD;  Location: [place];  Service: Radiology;  Laterality: N/A;    There were no vitals filed for this visit.  Subjective Assessment - 06/15/19 1934    Subjective   Patient reports she is doing well, her hand is improving but her shoulder is still bothering her and still is limited with motion and pain.    Pertinent History  Patient is a 64 y.o. female with history of stroke,  admitted to ED on 04/17/18 with dysphagia and right sided weakness s/p left MCA CVA, extubated on 04/19/18 and discharged on 04/23/18 to private residence    Patient Stated Goals  Patient reports she would like to get her right hand to move more and be able to use for daily tasks.    Currently in Pain?  Yes    Pain Score  8     Pain Location  Shoulder    Pain Orientation  Right    Pain Descriptors / Indicators  Aching         [facility] OT Assessment - 06/15/19 1945      Coordination   Right 9 Hole Peg Test  42 sec      Hand Function   Right Hand Grip (lbs)  16    Right Hand Lateral Pinch  15 lbs    Right Hand 3 Point Pinch  9 lbs    Left Hand Grip (lbs)  40    Left Hand Lateral Pinch  14 lbs    Left 3 point pinch  16 lbs      ADL:  Patient now Able to fold clothes, take them out of the dryer, able to put clothing in dresser but unable to use right hand to put clothes in the closet, difficulty raising arm due to pain and limited shoulder motion.   Patient using right hand to eat decreased spillage.   Independent with buttons, drying off   Difficulty with: Put and remove items from closet with right UE Lifting and managing Heavy pots Placing items in and out of the oven Arm fatigues with sweeping  Some pain with right shoulder extension 8/10 and with flexion 6/10 Shoulder extension 45 degrees Shoulder Flexion 84 degrees active  Reassessment of grip, pinch and 9 hole peg test as outlined in flowsheet.  Patient seen for focus on right shoulder ROM in supine Manual techniques for scapular motion for elevation, ABD/ADD, upwards rotation.  Joint mobs to right shoulder for posterior and inferior glides to decrease pain and increase ROM.   Patient tender over right biceps tendon.   Patient seen for PROM for shoulder flexion, ABD, diagonal patterns, elbow extension, ER with prolonged stretching within patient's pain tolerance.  AAROM for shoulder flexion to 90 degrees for place and hold, added external perturbations, small circles both directions, plus sign/cross motion.  Patient with increased pain with return from shoulder flexion.   Instructed on ice massage for right shoulder to decrease pain and inflammation.    Response to tx: Patient continues to make excellent progress with RUE  hand function as noted with improved grip, pinch and coordination skills.  Decreased 9 hole peg test from 2 min 1 sec to 42 seconds.  Right shoulder limited with ROM as noted above and pain greater than 120 degrees of flexion and with return from flexion around 70 degree arc.  Tenderness noted along bicep tendon this date.  Patient has limited visits per insurance and will return in one month for next visit.  Instructed on home program for shoulder stabilization exercises.                   OT Education - 06/15/19 1935    Education Details  R shoulder exercises, ROM, use of ice massage to right shoulder    Person(s) Educated  Patient    Methods  Explanation;Demonstration    Comprehension  Verbalized understanding;Returned demonstration          OT Long Term Goals - 06/15/19 1938      OT LONG TERM GOAL #1   Title  Patient will complete home exercise program with modified independence.    Baseline  addition of new exercises ongoing as patient progresses with RUE.    Time  12    Period  Weeks    Status  On-going    Target Date  07/11/19      OT LONG TERM GOAL #3   Title  Patient will demonstrate improved grip by 10 # in right Hand to be able to hold pitcher in right hand to pour a drink.    Baseline  met first goal, progress to holding heavier objects    Time  6    Period  Weeks    Status  Revised    Target Date  07/11/19      OT LONG TERM GOAL #5   Title  Patient will improve right hand function to be able to grasp pen to sign name on important papers with good legibility.    Baseline  Pt. is able to hold and use a standard pen for printing her name, however has difficulty signing her name. Pt. with 75% legibility for printed form with positive deviation above the line. Requiring increased time for letter formation.    Time  12    Period  Weeks    Status  Achieved      OT LONG TERM GOAL #6   Time  12      OT LONG TERM GOAL #7   Title  Patient will improve right  hand function to manipulate buttons, snaps,  zippers with modified independence.    Baseline  Pt. continues to be unable to manipulate buttons.    Time  12    Period  Weeks    Status  On-going      OT LONG TERM GOAL #8   Title  Patient will demonstrate the ability to stir items in a bowl of varying consistencies with modified independence.    Baseline  Pt. continues to have difficulty with heavier consistency items such as cookie dough    Time  12    Period  Weeks    Status  On-going    Target Date  07/11/19      OT LONG TERM GOAL  #9   TITLE  Patient will demonstrate the ability to move pots and pans from one location to another in the kitchen with modified independence.    Baseline  difficulty with heavier pots and pans.    Time  12    Period  Weeks    Status  On-going    Target Date  07/11/19      OT LONG TERM GOAL  #10   TITLE  Patient will improve right shoulder flexion by 10 degrees to assist with reaching and placing objects on table.    Baseline  Pt. is improving with right shoulder ROM. Pt. continues to have difficulty placing items on the table.    Time  12    Period  Weeks    Status  On-going    Target Date  07/11/19            Plan - 06/15/19 1938    Clinical Impression Statement  Patient continues to make excellent progress with RUE hand function as noted with improved grip, pinch and coordination skills.  Decreased 9 hole peg test from 2 min 1 sec to 42 seconds.  Right shoulder limited with ROM as noted above and pain greater than 120 degrees of flexion and with return from flexion around 70 degree arc.  Tenderness noted along bicep tendon this date.  Patient has limited visits per insurance and will return in one month for next visit.  Instructed on home program for shoulder stabilization exercises.    OT Occupational Profile and History  Detailed Assessment- Review of Records and additional review of physical, cognitive, psychosocial history related to current  functional performance    Occupational performance deficits (Please refer to evaluation for details):  ADL's;IADL's;Work;Leisure    Body Structure / Function / Physical Skills  ADL;Coordination;GMC;UE functional use;Balance;Decreased knowledge of use of DME;Flexibility;IADL;Dexterity;FMC;Proprioception;Strength;Edema;ROM    Cognitive Skills  Memory    Psychosocial Skills  Environmental  Adaptations;Habits;Routines and Behaviors    Rehab Potential  Good    Clinical Decision Making  Limited treatment options, no task modification necessary    Comorbidities Affecting Occupational Performance:  May have comorbidities impacting occupational performance    Modification or Assistance to Complete Evaluation   No modification of tasks or assist necessary to complete eval    OT Frequency  2x / week    OT Duration  12 weeks    OT Treatment/Interventions  Self-care/ADL training;Cryotherapy;Therapeutic exercise;DME and/or AE instruction;Functional Mobility Training;Cognitive remediation/compensation;Balance training;Electrical Stimulation;Neuromuscular education;Manual Therapy;Splinting;Moist Heat;Contrast Bath;Therapeutic activities;Patient/family education;Passive range of motion    Consulted and Agree with Plan of Care  Patient       Patient will benefit from skilled therapeutic intervention in order to improve the following deficits and impairments:   Body Structure / Function / Physical Skills: ADL, Coordination, GMC, UE  functional use, Balance, Decreased knowledge of use of DME, Flexibility, IADL, Dexterity, FMC, Proprioception, Strength, Edema, ROM Cognitive Skills: Memory Psychosocial Skills: Environmental  Adaptations, Habits, Routines and Behaviors   Visit Diagnosis: Muscle weakness (generalized)  Other lack of coordination  Hemiplegia and hemiparesis following cerebral infarction affecting right dominant side [facility])    Problem List Patient Active Problem List   Diagnosis Date Noted  .  Mixed hyperlipidemia 09/28/2018  . Middle cerebral artery embolism, left 04/18/2018  . History of stroke 04/17/2018  . IFG (impaired fasting glucose) 03/28/2018  . Essential hypertension, benign 01/15/2017  . Asthma 01/15/2017  . Obstructive sleep apnea 10/18/2013   Achilles Dunk, OTR/L, CLT  Lovett,Amy 06/15/2019, 8:02 PM  [place] MAIN [facility] SERVICES [address] [place], [place], [address] Phone: [phone]   Fax:  [phone]  Name: SHEVA MCDOUGLE MRN: [id number] Date of Birth: 01-Feb-1956

## 2019-06-19 ENCOUNTER — Telehealth: Payer: Self-pay | Admitting: Nurse Practitioner

## 2019-06-19 NOTE — Telephone Encounter (Signed)
Copied from CRM [id number]. Topic: General - Other >> Jun 19, 2019 12:18 PM Dalphine Handing A wrote: Patient requesting  a callback from [place]. Patient returned her call

## 2019-06-19 NOTE — Telephone Encounter (Signed)
See other telephone encounter. Will call patient back.

## 2019-06-19 NOTE — Telephone Encounter (Signed)
Do not see where form has been scanned into chart yet if it has been received. Looked in papers that we still have and do not see it there either. Tried calling patient to discuss with her, left VM asking for patient to please return my call.

## 2019-06-19 NOTE — Telephone Encounter (Signed)
Copied from CRM [id number]. Topic: General - Other >> Jun 19, 2019  8:55 AM Crist Infante wrote: Reason for CRM: pt wants to know if [facility] has received the request from [employer] to change the size of her underpants? She states they said they sent on 06/01/19

## 2019-06-19 NOTE — Telephone Encounter (Signed)
Called [employer]. I spoke with the representative and let her know what was going on. She states that when they sent the fax on 06/01/19, that they never got a confirmation that the order form actually sent to [place]. She states that she is going to fax it to [place] right now. Will await for and fax back once complete.

## 2019-06-21 DIAGNOSIS — N3944 Nocturnal enuresis: Secondary | ICD-10-CM | POA: Diagnosis not present

## 2019-06-21 DIAGNOSIS — R32 Unspecified urinary incontinence: Secondary | ICD-10-CM | POA: Diagnosis not present

## 2019-06-21 NOTE — Telephone Encounter (Signed)
Form received, signed by Corrie Dandy, and faxed back to [employer].   Called and LVM letting patient know that this was completed.

## 2019-07-12 ENCOUNTER — Other Ambulatory Visit: Payer: Self-pay

## 2019-07-12 ENCOUNTER — Ambulatory Visit: Payer: Medicaid Other | Admitting: Physical Therapy

## 2019-07-12 ENCOUNTER — Ambulatory Visit: Payer: Medicaid Other | Attending: Nurse Practitioner | Admitting: Occupational Therapy

## 2019-07-12 DIAGNOSIS — I69351 Hemiplegia and hemiparesis following cerebral infarction affecting right dominant side: Secondary | ICD-10-CM | POA: Diagnosis not present

## 2019-07-12 DIAGNOSIS — M6281 Muscle weakness (generalized): Secondary | ICD-10-CM | POA: Insufficient documentation

## 2019-07-12 DIAGNOSIS — R278 Other lack of coordination: Secondary | ICD-10-CM | POA: Diagnosis not present

## 2019-07-13 ENCOUNTER — Encounter: Payer: Self-pay | Admitting: Occupational Therapy

## 2019-07-13 NOTE — Therapy (Signed)
[place] MAIN [facility] SERVICES [address] [place], [place], [address] Phone: [phone]   Fax:  [phone]  Occupational Therapy Treatment  Patient Details  Name: Kristin Solis MRN: [id number] Date of Birth: Mar 31, 1955 Referring Provider (OT): Marnee Guarneri   Encounter Date: 07/12/2019  OT End of Session - 07/13/19 1149    Visit Number  26    Number of Visits  37    Date for OT Re-Evaluation  10/06/19    Authorization Type  Progress reporting period starting 07/12/2019    OT Start Time  1030    OT Stop Time  1115    OT Time Calculation (min)  45 min    Activity Tolerance  Patient tolerated treatment well    Behavior During Therapy  [facility] for tasks assessed/performed       Past Medical History:  Diagnosis Date  . Asthma   . Hypertension   . Stroke [facility])     Past Surgical History:  Procedure Laterality Date  . ABDOMINAL HYSTERECTOMY  2005   total  . IR CT HEAD LTD  04/18/2018  . IR INTRAVSC STENT CERV CAROTID W/O EMB-PROT MOD SED INC ANGIO  04/18/2018  . IR PERCUTANEOUS ART THROMBECTOMY/INFUSION INTRACRANIAL INC DIAG ANGIO  04/18/2018  . OOPHORECTOMY    . RADIOLOGY WITH ANESTHESIA N/A 04/17/2018   Procedure: RADIOLOGY WITH ANESTHESIA;  Surgeon: Luanne Bras, MD;  Location: [place];  Service: Radiology;  Laterality: N/A;    There were no vitals filed for this visit.  Subjective Assessment - 07/13/19 1148    Subjective   Patient reports she has continued to work on shoulder motion at home as well as coordination skills.  Pain still present in right shoulder but less than when she was here a month ago.    Pertinent History  Patient is a 64 y.o. female with history of stroke,  admitted to ED on 04/17/18 with dysphagia and right sided weakness s/p left MCA CVA, extubated on 04/19/18 and discharged on 04/23/18 to private residence    Patient Stated Goals  Patient reports she would like to get her right hand to move more and be able to use  for daily tasks.    Currently in Pain?  Yes    Pain Score  4     Pain Location  Shoulder    Pain Orientation  Right    Pain Descriptors / Indicators  Aching    Pain Type  Acute pain    Pain Onset  More than a month ago    Pain Frequency  Intermittent    Multiple Pain Sites  No         [facility] OT Assessment - 07/13/19 1431      Coordination   Right 9 Hole Peg Test  40    Left 9 Hole Peg Test  26      Hand Function   Right Hand Grip (lbs)  22    Right Hand Lateral Pinch  9 lbs    Right Hand 3 Point Pinch  9 lbs    Left Hand Grip (lbs)  52    Left Hand Lateral Pinch  16 lbs    Left 3 point pinch  17 lbs        Patient seen for reassessment of skills as outlined in above flowsheet Goals updated to reflect progress.   Shoulder flexion right 105 degrees with 5/ 10 pain with return from flexion sitting.  Patient does  demonstrate some compensatory movements at the shoulder and responds to cues.   Patient seen in supine for shoulder stabilization exercises with shoulder flexion to 90 degrees, place and hold for up to 10 secs, place and hold with external perturbations, shoulder flexion to 90 degrees while performing elbow flexion/extension, shoulder protraction, small circles both directions, small plus signs with arm in same position.   Added tendon gliding exercises for right hand for increased ROM, decreased pain, still has some edema present in right hand and attempting contrast at home for edema control.  Patient to perform 10 reps of exercises 2-3 times a day.    Response to tx:   Patient has continued to make excellent progress, remains limited with right shoulder ROM and strength however she continues to improve over time.   Patient responding well to cues for shoulder compensation.  Adjusted home program this date and added tendon gliding exercises for right hand.  Also encouraging shoulder stabilization exercises for HEP over the next month.  She has a limitation with her  therapy visits and is trying to extend her visits to 1 time a month for reassessments and upgrades with exercises.  Patient continues to benefit from skilled OT services to maximize her safety and independence in necessary daily tasks.                OT Education - 07/13/19 1434    Education Details  shoulder stabilization exercises, ROM    Person(s) Educated  Patient    Methods  Explanation;Demonstration    Comprehension  Verbalized understanding;Returned demonstration          OT Long Term Goals - 07/12/19 1044      OT LONG TERM GOAL #1   Title  Patient will complete home exercise program with modified independence.    Baseline  addition of new exercises ongoing as patient progresses with RUE.    Time  12    Period  Weeks    Status  On-going      OT LONG TERM GOAL #3   Title  Patient will demonstrate improved grip by 10 # in right Hand to be able to hold pitcher in right hand to pour a drink.    Baseline  met first goal, progress to holding heavier objects    Time  6    Period  Weeks    Status  Revised      OT LONG TERM GOAL #5   Title  Patient will improve right hand function to be able to grasp pen to sign name on important papers with good legibility.    Baseline  Pt. is able to hold and use a standard pen for printing her name, however has difficulty signing her name. Pt. with 75% legibility for printed form with positive deviation above the line. Requiring increased time for letter formation.    Time  12    Period  Weeks    Status  Achieved      OT LONG TERM GOAL #6   Time  12      OT LONG TERM GOAL #7   Title  Patient will improve right hand function to manipulate buttons, snaps, zippers with modified independence.    Baseline  Pt. continues to be unable to manipulate buttons.    Time  12    Period  Weeks    Status  Achieved      OT LONG TERM GOAL #8   Title  Patient will demonstrate the  ability to stir items in a bowl of varying consistencies with  modified independence.    Baseline  Pt. continues to have difficulty with heavier consistency items such as cookie dough    Time  12    Period  Weeks    Status  On-going      OT LONG TERM GOAL  #9   TITLE  Patient will demonstrate the ability to move pots and pans from one location to another in the kitchen with modified independence.    Baseline  difficulty with heavier pots and pans.    Time  12    Period  Weeks    Status  On-going      OT LONG TERM GOAL  #10   TITLE  Patient will improve right shoulder flexion by 10 degrees to assist with reaching and placing objects on table.    Baseline  Pt. is improving with right shoulder ROM. Pt. continues to have difficulty placing items on the table.    Time  12    Period  Weeks    Status  On-going            Plan - 07/13/19 1151    Clinical Impression Statement  Patient has continued to make excellent progress, remains limited with right shoulder ROM and strength however she continues to improve over time.   Patient responding well to cues for shoulder compensation.  Adjusted home program this date and added tendon gliding exercises for right hand.  Also encouraging shoulder stabilization exercises for HEP over the next month.  She has a limitation with her therapy visits and is trying to extend her visits to 1 time a month for reassessments and upgrades with exercises.  Patient continues to benefit from skilled OT services to maximize her safety and independence in necessary daily tasks.    OT Occupational Profile and History  Detailed Assessment- Review of Records and additional review of physical, cognitive, psychosocial history related to current functional performance    Occupational performance deficits (Please refer to evaluation for details):  ADL's;IADL's;Work;Leisure    Body Structure / Function / Physical Skills  ADL;Coordination;GMC;UE functional use;Balance;Decreased knowledge of use of  DME;Flexibility;IADL;Dexterity;FMC;Proprioception;Strength;Edema;ROM    Cognitive Skills  Memory    Psychosocial Skills  Environmental  Adaptations;Habits;Routines and Behaviors    Rehab Potential  Good    Clinical Decision Making  Limited treatment options, no task modification necessary    Comorbidities Affecting Occupational Performance:  May have comorbidities impacting occupational performance    Modification or Assistance to Complete Evaluation   No modification of tasks or assist necessary to complete eval    OT Frequency  Monthly    OT Duration  12 weeks    OT Treatment/Interventions  Self-care/ADL training;Cryotherapy;Therapeutic exercise;DME and/or AE instruction;Functional Mobility Training;Cognitive remediation/compensation;Balance training;Electrical Stimulation;Neuromuscular education;Manual Therapy;Splinting;Moist Heat;Contrast Bath;Therapeutic activities;Patient/family education;Passive range of motion    Consulted and Agree with Plan of Care  Patient       Patient will benefit from skilled therapeutic intervention in order to improve the following deficits and impairments:   Body Structure / Function / Physical Skills: ADL, Coordination, GMC, UE functional use, Balance, Decreased knowledge of use of DME, Flexibility, IADL, Dexterity, FMC, Proprioception, Strength, Edema, ROM Cognitive Skills: Memory Psychosocial Skills: Environmental  Adaptations, Habits, Routines and Behaviors   Visit Diagnosis: Muscle weakness (generalized)  Other lack of coordination  Hemiplegia and hemiparesis following cerebral infarction affecting right dominant side [facility])    Problem List Patient Active Problem List  Diagnosis Date Noted  . Mixed hyperlipidemia 09/28/2018  . Middle cerebral artery embolism, left 04/18/2018  . History of stroke 04/17/2018  . IFG (impaired fasting glucose) 03/28/2018  . Essential hypertension, benign 01/15/2017  . Asthma 01/15/2017  . Obstructive sleep  apnea 10/18/2013   Achilles Dunk, OTR/L, CLT  Rockney Grenz 07/13/2019, 2:44 PM  [place] MAIN [facility] SERVICES [address] [place], [place], [address] Phone: [phone]   Fax:  [phone]  Name: Kristin Solis MRN: [id number] Date of Birth: 1955-07-28

## 2019-07-13 NOTE — Addendum Note (Signed)
Addended by: Derrek Gu T on: 07/13/2019 02:47 PM   Modules accepted: Orders

## 2019-07-18 ENCOUNTER — Ambulatory Visit: Payer: Self-pay | Admitting: General Practice

## 2019-07-18 ENCOUNTER — Telehealth: Payer: Self-pay | Admitting: General Practice

## 2019-07-18 DIAGNOSIS — E782 Mixed hyperlipidemia: Secondary | ICD-10-CM

## 2019-07-18 DIAGNOSIS — I10 Essential (primary) hypertension: Secondary | ICD-10-CM

## 2019-07-18 DIAGNOSIS — Z8673 Personal history of transient ischemic attack (TIA), and cerebral infarction without residual deficits: Secondary | ICD-10-CM

## 2019-07-18 NOTE — Patient Instructions (Signed)
Visit Information  Goals Addressed            This Visit's Progress   . COMPLETED: RN-"I need help getting therapy" (pt-stated)       Current Barriers:  [name] Knowledge Deficits related to options for therapy post CVA . Corporate treasurer.   Nurse Case Manager Clinical Goal(s):  [name] Over the next 120 days, patient will verbalize understanding of plan for outpatient therapy options  Interventions:  . Patient excited about the progress she has made with PT/OT- Patient is continuing to participated and reports good compliance with at home exercises.  . Patient reporting good blood pressure control- B/P today 139/68 . Patient stating she has filled out all forms necessary for patient assistance with Advair and albuterol and returned them. Completed . Evaluated if the patient still has PT and she states she will have PT through July 2021 through [facility]    Patient Self Care Activities:  . Currently UNABLE TO independently perform ADL's including self care activities such as bathing, dressing because of stroke deficits   Plan:  . RNCM will follow up with patient over the next 120 days to ensure continuation of services or options provided by outpatient PT   Please see past updates related to this goal by clicking on the "Past Updates" button in the selected goal      . RNCM: Chronic Conditions       CARE PLAN ENTRY (see longtitudinal plan of care for additional care plan information)  Current Barriers:  . Chronic Disease Management support, education, and care coordination needs related to HTN, HLD, Pulmonary Disease, and past stroke   Clinical Goal(s) related to HTN, HLD, Pulmonary Disease, and Past stroke  :  Over the next 120 days, patient will:  . Work with the care management team to address educational, disease management, and care coordination needs  . Begin or continue self health monitoring activities as directed today Measure and record blood pressure 2 times per week and  adhere to heart healthy diet . Call provider office for new or worsened signs and symptoms Blood pressure findings outside established parameters and New or worsened symptom related to HLD or after affects of stroke . Call care management team with questions or concerns . Verbalize basic understanding of patient centered plan of care established today  Interventions related to HTN, HLD, Pulmonary Disease, and past stroke :  . Evaluation of current treatment plans and patient's adherence to plan as established by provider.  The patient is doing well. Continues to work with OT and that will end in August. The patient denies any new concerns. States that she does have pain some in her shoulder when she lifts it up but once she rest it stops hurting. Is pleased with her progress.  . Assessed patient understanding of disease states.  Has a good understanding of her conditions and how to manage her care. Denies any falls  . Assessed patient's education and care coordination needs.  The patient denies any needs at this time from the pcp or CCM team. . Evaluation of next appointment. Does not have an appointment with the pcp until 10/09/2019 . Provided disease specific education to patient.  Education on following a heart healthy diet. The patient endorses compliance with dietary restrictions . Evaluation of blood pressure readings. The patient had not taken today but gave RNCM monthly readings: 120/63, 122/72, 124/69, and 116/62.  The patient denies any episodes of HTN or hypotension.  [name] Collaborated with appropriate  clinical care team members regarding patient needs  Patient Self Care Activities related to HTN, HLD, Pulmonary Disease, and past stroke :  . Patient is unable to independently self-manage chronic health conditions  Please see past updates related to this goal by clicking on the "Past Updates" button in the selected goal         Patient verbalizes understanding of instructions provided  today.   The care management team will reach out to the patient again over the next 60 to 90 days.   Noreene Larsson RN, MSN, [facility] Mobile: [phone]

## 2019-07-18 NOTE — Chronic Care Management (AMB) (Signed)
Care Management   Follow Up Note   07/18/2019 Name: Kristin Solis MRN: [id number] DOB: December 28, 1955  Referred by: [name], NP Reason for referral : Care Coordination (RNCM: Chronic Disease Management and Care Coordination Needs)   [name] is a 64 y.o. year old female who is a primary care patient of [name], Kristin Faster, NP. The care management team was consulted for assistance with care management and care coordination needs.    Review of patient status, including review of consultants reports, relevant laboratory and other test results, and collaboration with appropriate care team members and the patient's provider was performed as part of comprehensive patient evaluation and provision of chronic care management services.    SDOH (Social Determinants of Health) assessments performed: Yes- the patient is working with OT still and doing her exercises when she is not working with OT See Care Plan activities for detailed interventions related to [facility])     Advanced Directives: Talent and [employer] application for related entries.   BP Readings from Last 3 Encounters:  05/24/19 (!) 146/66  05/23/19 139/68  04/10/19 122/68    Goals Addressed            This Visit's Progress    COMPLETED: RN-"I need help getting therapy" (pt-stated)       Current Barriers:   Knowledge Deficits related to options for therapy post CVA  Financial Constraints.   Nurse Case Manager Clinical Goal(s):   Over the next 120 days, patient will verbalize understanding of plan for outpatient therapy options  Interventions:   Patient excited about the progress she has made with PT/OT- Patient is continuing to participated and reports good compliance with at home exercises.   Patient reporting good blood pressure control- B/P today 139/68  Patient stating she has filled out all forms necessary for patient assistance with Advair and albuterol and returned them. Completed  Evaluated if  the patient still has PT and she states she will have PT through July 2021 through Medicaid    Patient Self Care Activities:   Currently UNABLE TO independently perform ADL's including self care activities such as bathing, dressing because of stroke deficits   Plan:   RNCM will follow up with patient over the next 120 days to ensure continuation of services or options provided by outpatient PT   Please see past updates related to this goal by clicking on the "Past Updates" button in the selected goal       RNCM: Chronic Conditions       CARE PLAN ENTRY (see longtitudinal plan of care for additional care plan information)  Current Barriers:   Chronic Disease Management support, education, and care coordination needs related to HTN, HLD, Pulmonary Disease, and past stroke   Clinical Goal(s) related to HTN, HLD, Pulmonary Disease, and Past stroke  :  Over the next 120 days, patient will:   Work with the care management team to address educational, disease management, and care coordination needs   Begin or continue self health monitoring activities as directed today Measure and record blood pressure 2 times per week and adhere to heart healthy diet  Call provider office for new or worsened signs and symptoms Blood pressure findings outside established parameters and New or worsened symptom related to HLD or after affects of stroke  Call care management team with questions or concerns  Verbalize basic understanding of patient centered plan of care established today  Interventions related to HTN, HLD, Pulmonary Disease,  and past stroke :   Evaluation of current treatment plans and patient's adherence to plan as established by provider.  The patient is doing well. Continues to work with OT and that will end in August. The patient denies any new concerns. States that she does have pain some in her shoulder when she lifts it up but once she rest it stops hurting. Is pleased with her  progress.   Assessed patient understanding of disease states.  Has a good understanding of her conditions and how to manage her care. Denies any falls   Assessed patient's education and care coordination needs.  The patient denies any needs at this time from the pcp or CCM team.  Evaluation of next appointment. Does not have an appointment with the pcp until 10/09/2019  Provided disease specific education to patient.  Education on following a heart healthy diet. The patient endorses compliance with dietary restrictions  Evaluation of blood pressure readings. The patient had not taken today but gave RNCM monthly readings: 120/63, 122/72, 124/69, and 116/62.  The patient denies any episodes of HTN or hypotension.   Collaborated with appropriate clinical care team members regarding patient needs  Patient Self Care Activities related to HTN, HLD, Pulmonary Disease, and past stroke :   Patient is unable to independently self-manage chronic health conditions  Please see past updates related to this goal by clicking on the "Past Updates" button in the selected goal          The care management team will reach out to the patient again over the next 60 to 90 days.   Alto Denver RN, MSN, CCM Community Care Coordinator [place]   [facility] Mobile: [phone]

## 2019-08-09 ENCOUNTER — Encounter: Payer: Self-pay | Admitting: Occupational Therapy

## 2019-08-09 ENCOUNTER — Ambulatory Visit: Payer: Medicaid Other | Admitting: Physical Therapy

## 2019-08-09 ENCOUNTER — Other Ambulatory Visit: Payer: Self-pay

## 2019-08-09 ENCOUNTER — Ambulatory Visit: Payer: Medicaid Other | Attending: Nurse Practitioner | Admitting: Occupational Therapy

## 2019-08-09 DIAGNOSIS — M6281 Muscle weakness (generalized): Secondary | ICD-10-CM | POA: Diagnosis not present

## 2019-08-09 DIAGNOSIS — I69351 Hemiplegia and hemiparesis following cerebral infarction affecting right dominant side: Secondary | ICD-10-CM | POA: Diagnosis not present

## 2019-08-09 DIAGNOSIS — R278 Other lack of coordination: Secondary | ICD-10-CM | POA: Diagnosis not present

## 2019-08-09 NOTE — Therapy (Signed)
[place] MAIN [facility] SERVICES [address] [place], [place], [address] Phone: [phone]   Fax:  [phone]  Occupational Therapy Treatment  Patient Details  Name: Kristin Solis MRN: [id number] Date of Birth: 1955/07/14 Referring Provider (OT): Marnee Guarneri   Encounter Date: 08/09/2019   OT End of Session - 08/09/19 2140    Visit Number 27    Number of Visits 37    Date for OT Re-Evaluation 10/06/19    Authorization Type Progress reporting period starting 07/12/2019    OT Start Time 1300    OT Stop Time 1401    OT Time Calculation (min) 61 min    Activity Tolerance Patient tolerated treatment well    Behavior During Therapy [facility] for tasks assessed/performed           Past Medical History:  Diagnosis Date  . Asthma   . Hypertension   . Stroke [facility])     Past Surgical History:  Procedure Laterality Date  . ABDOMINAL HYSTERECTOMY  2005   total  . IR CT HEAD LTD  04/18/2018  . IR INTRAVSC STENT CERV CAROTID W/O EMB-PROT MOD SED INC ANGIO  04/18/2018  . IR PERCUTANEOUS ART THROMBECTOMY/INFUSION INTRACRANIAL INC DIAG ANGIO  04/18/2018  . OOPHORECTOMY    . RADIOLOGY WITH ANESTHESIA N/A 04/17/2018   Procedure: RADIOLOGY WITH ANESTHESIA;  Surgeon: Luanne Bras, MD;  Location: [place];  Service: Radiology;  Laterality: N/A;    There were no vitals filed for this visit.   Subjective Assessment - 08/09/19 2139    Pertinent History Patient is a 64 y.o. female with history of stroke,  admitted to ED on 04/17/18 with dysphagia and right sided weakness s/p left MCA CVA, extubated on 04/19/18 and discharged on 04/23/18 to private residence    Patient Stated Goals Patient reports she would like to get her right hand to move more and be able to use for daily tasks.    Currently in Pain? Yes    Pain Score 3     Pain Location Shoulder    Pain Orientation Right    Pain Descriptors / Indicators Aching    Pain Type Acute pain    Pain Onset More than  a month ago    Pain Frequency Intermittent          Patient seen for exercises in supine with the following measurements:  114 shoulder flexion, 3/10 pain sitting, 155 in supine no pain Patient performing exercises at home, was unsure of ABC exercise and required reinstruction, was using hand and wrist more instead of shoulder. Patient seen for Shoulder flexion, scaption, protraction, place and hold with arm in 90 degrees of shoulder flexion, small circles each direction, reinstruction on ABC with min cues.  UBE in sitting for 8 mins, forwards/backwards, alternating levels of resistance and direction with short rest breaks as needed. Therapist in constant attendance to ensure grip and adjust resistance and settings.   Neuromuscular Reeducation:  Patient seen for manipulation of small 1/2 inch pegs, picking up and placing into grid then advancing to moving items to palm with translatory skills for using hand for storage.     Response to tx: Patient continues to progress, decreased pain this date and increased active ROM.  Patient's coordination skills have improved and now able to pick up small 1/2 inch objects.  Progressing to translatory skills of the hand and using hand for storage, dropping items frequently and will work towards improving speed and dexterity.  Patient with limited insurance visits so she is only being seen 1 time a month for upgrades to home program and progressing with exercises.  Continue OT to maximize safety and independence in daily tasks.                    OT Education - 08/09/19 2140    Education Details shoulder stabilization exercises, ROM, coordination skills    Person(s) Educated Patient    Methods Explanation;Demonstration    Comprehension Verbalized understanding;Returned demonstration               OT Long Term Goals - 07/12/19 1044      OT LONG TERM GOAL #1   Title Patient will complete home exercise program with modified  independence.    Baseline addition of new exercises ongoing as patient progresses with RUE.    Time 12    Period Weeks    Status On-going      OT LONG TERM GOAL #3   Title Patient will demonstrate improved grip by 10 # in right Hand to be able to hold pitcher in right hand to pour a drink.    Baseline met first goal, progress to holding heavier objects    Time 6    Period Weeks    Status Revised      OT LONG TERM GOAL #5   Title Patient will improve right hand function to be able to grasp pen to sign name on important papers with good legibility.    Baseline Pt. is able to hold and use a standard pen for printing her name, however has difficulty signing her name. Pt. with 75% legibility for printed form with positive deviation above the line. Requiring increased time for letter formation.    Time 12    Period Weeks    Status Achieved      OT LONG TERM GOAL #6   Time 12      OT LONG TERM GOAL #7   Title Patient will improve right hand function to manipulate buttons, snaps, zippers with modified independence.    Baseline Pt. continues to be unable to manipulate buttons.    Time 12    Period Weeks    Status Achieved      OT LONG TERM GOAL #8   Title Patient will demonstrate the ability to stir items in a bowl of varying consistencies with modified independence.    Baseline Pt. continues to have difficulty with heavier consistency items such as cookie dough    Time 12    Period Weeks    Status On-going      OT LONG TERM GOAL  #9   TITLE Patient will demonstrate the ability to move pots and pans from one location to another in the kitchen with modified independence.    Baseline difficulty with heavier pots and pans.    Time 12    Period Weeks    Status On-going      OT LONG TERM GOAL  #10   TITLE Patient will improve right shoulder flexion by 10 degrees to assist with reaching and placing objects on table.    Baseline Pt. is improving with right shoulder ROM. Pt. continues to  have difficulty placing items on the table.    Time 12    Period Weeks    Status On-going                 Plan - 08/09/19 2141    Clinical  Impression Statement Patient continues to progress, decreased pain this date and increased active ROM.  Patient's coordination skills have improved and now able to pick up small 1/2 inch objects.  Progressing to translatory skills of the hand and using hand for storage, dropping items frequently and will work towards improving speed and dexterity.  Patient with limited insurance visits so she is only being seen 1 time a month for upgrades to home program and progressing with exercises.  Continue OT to maximize safety and independence in daily tasks.    OT Occupational Profile and History Detailed Assessment- Review of Records and additional review of physical, cognitive, psychosocial history related to current functional performance    Occupational performance deficits (Please refer to evaluation for details): ADL's;IADL's;Work;Leisure    Body Structure / Function / Physical Skills ADL;Coordination;GMC;UE functional use;Balance;Decreased knowledge of use of DME;Flexibility;IADL;Dexterity;FMC;Proprioception;Strength;Edema;ROM    Cognitive Skills Memory    Psychosocial Skills Environmental  Adaptations;Habits;Routines and Behaviors    Rehab Potential Good    Clinical Decision Making Limited treatment options, no task modification necessary    Comorbidities Affecting Occupational Performance: May have comorbidities impacting occupational performance    Modification or Assistance to Complete Evaluation  No modification of tasks or assist necessary to complete eval    OT Frequency Monthly    OT Duration 12 weeks    OT Treatment/Interventions Self-care/ADL training;Cryotherapy;Therapeutic exercise;DME and/or AE instruction;Functional Mobility Training;Cognitive remediation/compensation;Balance training;Electrical Stimulation;Neuromuscular education;Manual  Therapy;Splinting;Moist Heat;Contrast Bath;Therapeutic activities;Patient/family education;Passive range of motion    Consulted and Agree with Plan of Care Patient           Patient will benefit from skilled therapeutic intervention in order to improve the following deficits and impairments:   Body Structure / Function / Physical Skills: ADL, Coordination, GMC, UE functional use, Balance, Decreased knowledge of use of DME, Flexibility, IADL, Dexterity, FMC, Proprioception, Strength, Edema, ROM Cognitive Skills: Memory Psychosocial Skills: Environmental  Adaptations, Habits, Routines and Behaviors   Visit Diagnosis: Muscle weakness (generalized)  Other lack of coordination  Hemiplegia and hemiparesis following cerebral infarction affecting right dominant side [facility])    Problem List Patient Active Problem List   Diagnosis Date Noted  . Mixed hyperlipidemia 09/28/2018  . Middle cerebral artery embolism, left 04/18/2018  . History of stroke 04/17/2018  . IFG (impaired fasting glucose) 03/28/2018  . Essential hypertension, benign 01/15/2017  . Asthma 01/15/2017  . Obstructive sleep apnea 10/18/2013   Achilles Dunk, OTR/L, CLT  Mervin Ramires 08/10/2019, 4:00 PM  [place] MAIN [facility] SERVICES [address] [place], [place], [address] Phone: [phone]   Fax:  [phone]  Name: KALEEYAH CUFFIE MRN: [id number] Date of Birth: Oct 06, 1955

## 2019-08-14 ENCOUNTER — Other Ambulatory Visit: Payer: Self-pay

## 2019-08-14 ENCOUNTER — Emergency Department
Admission: EM | Admit: 2019-08-14 | Discharge: 2019-08-14 | Disposition: A | Discharge status: Home / Self Care | Payer: Medicaid Other | Attending: Emergency Medicine | Admitting: Emergency Medicine

## 2019-08-14 ENCOUNTER — Encounter: Payer: Self-pay | Admitting: Emergency Medicine

## 2019-08-14 ENCOUNTER — Emergency Department: Payer: Medicaid Other

## 2019-08-14 DIAGNOSIS — I10 Essential (primary) hypertension: Secondary | ICD-10-CM | POA: Insufficient documentation

## 2019-08-14 DIAGNOSIS — J45909 Unspecified asthma, uncomplicated: Secondary | ICD-10-CM | POA: Insufficient documentation

## 2019-08-14 DIAGNOSIS — M7989 Other specified soft tissue disorders: Secondary | ICD-10-CM | POA: Diagnosis not present

## 2019-08-14 DIAGNOSIS — Z79899 Other long term (current) drug therapy: Secondary | ICD-10-CM | POA: Diagnosis not present

## 2019-08-14 DIAGNOSIS — M79604 Pain in right leg: Secondary | ICD-10-CM | POA: Diagnosis not present

## 2019-08-14 DIAGNOSIS — M79606 Pain in leg, unspecified: Secondary | ICD-10-CM | POA: Diagnosis present

## 2019-08-14 DIAGNOSIS — M79651 Pain in right thigh: Secondary | ICD-10-CM | POA: Diagnosis not present

## 2019-08-14 MED ORDER — BACLOFEN 5 MG PO TABS: 5.0000 mg | ORAL_TABLET | Freq: Three times a day (TID) | ORAL | 0 refills | Status: DC | PRN

## 2019-08-14 MED ORDER — LIDOCAINE 5 % EX PTCH: 1.0000 | MEDICATED_PATCH | CUTANEOUS | 0 refills | Status: DC

## 2019-08-14 NOTE — ED Provider Notes (Signed)
[facility] Emergency Department Provider Note  ____________________________________________  Time seen: Approximately 10:37 AM  I have reviewed the triage vital signs and the nursing notes.   HISTORY  Chief Complaint Leg Pain    HPI Kristin Solis is a 64 y.o. female that presents to the emergency department for evaluation of right posterior thigh pain for 2 days.  Patient states that she has a pinpoint area of pain to her posterior thigh when she squats or bends forward.  She has not fallen.  She denies any pain with sitting still or with ambulation.  She only has pain when she bends forward or squats to sit on the toilet.  Patient has a history of a DVT and is not currently on blood thinners.  She presented today because she is concerned for another DVT.  No fever, nausea, vomiting, abdominal pain, back pain, urinary symptoms, leg swelling, numbness, tingling.   Past Medical History:  Diagnosis Date  . Asthma   . Hypertension   . Stroke [facility])     Patient Active Problem List   Diagnosis Date Noted  . Mixed hyperlipidemia 09/28/2018  . Middle cerebral artery embolism, left 04/18/2018  . History of stroke 04/17/2018  . IFG (impaired fasting glucose) 03/28/2018  . Essential hypertension, benign 01/15/2017  . Asthma 01/15/2017  . Obstructive sleep apnea 10/18/2013    Past Surgical History:  Procedure Laterality Date  . ABDOMINAL HYSTERECTOMY  2005   total  . IR CT HEAD LTD  04/18/2018  . IR INTRAVSC STENT CERV CAROTID W/O EMB-PROT MOD SED INC ANGIO  04/18/2018  . IR PERCUTANEOUS ART THROMBECTOMY/INFUSION INTRACRANIAL INC DIAG ANGIO  04/18/2018  . OOPHORECTOMY    . RADIOLOGY WITH ANESTHESIA N/A 04/17/2018   Procedure: RADIOLOGY WITH ANESTHESIA;  Surgeon: Julieanne Cotton, MD;  Location: [facility] OR;  Service: Radiology;  Laterality: N/A;    Prior to Admission medications   Medication Sig Start Date End Date Taking? Authorizing Provider  albuterol (VENTOLIN  HFA) 108 (90 Base) MCG/ACT inhaler Inhale 2 puffs into the lungs every 6 (six) hours as needed for wheezing or shortness of breath. 10/11/18   [name], Corrie Dandy T, NP  aspirin EC 81 MG EC tablet Take 1 tablet (81 mg total) by mouth daily. 04/23/18   Rinehuls, Kinnie Scales, PA-C  atorvastatin (LIPITOR) 40 MG tablet Take 1 tablet (40 mg total) by mouth daily at 6 PM. 04/10/19   [name], [name], NP  Baclofen 5 MG TABS Take 5 mg by mouth 3 (three) times daily as needed. 08/14/19   Enid Derry, PA-C  fluticasone (FLONASE) 50 MCG/ACT nasal spray Place 2 sprays into both nostrils daily. 04/10/19   [name], Corrie Dandy T, NP  Fluticasone-Salmeterol (ADVAIR DISKUS) 250-50 MCG/DOSE AEPB Inhale 1 puff into the lungs 2 (two) times daily. 10/11/18   [name], Corrie Dandy T, NP  lidocaine (LIDODERM) 5 % Place 1 patch onto the skin daily. Remove & Discard patch within 12 hours or as directed by MD 08/14/19   Enid Derry, PA-C  lisinopril (ZESTRIL) 5 MG tablet Take 1 tablet (5 mg total) by mouth daily. 04/10/19   [name], NP    Allergies Patient has no known allergies.  Family History  Problem Relation Age of Onset  . Cancer Mother        lung  . Cancer Father        brain  . Diabetes Sister   . Alcohol abuse Brother   . Stroke Son  x2  . CAD Maternal Grandmother   . Emphysema Paternal Grandfather   . Heart disease Sister        CHF    Social History Social History   Tobacco Use  . Smoking status: Never Smoker  . Smokeless tobacco: Never Used  Vaping Use  . Vaping Use: Never used  Substance Use Topics  . Alcohol use: No  . Drug use: No     Review of Systems  Cardiovascular: No chest pain. Respiratory:  No SOB. Gastrointestinal: No abdominal pain.  No nausea, no vomiting.  Musculoskeletal: Positive for thigh pain. Skin: Negative for rash, abrasions, lacerations, ecchymosis. Neurological: Negative for headaches, numbness or  tingling   ____________________________________________   PHYSICAL EXAM:  VITAL SIGNS: ED Triage Vitals  Enc Vitals Group     BP 08/14/19 0814 139/68     Pulse Rate 08/14/19 0812 84     Resp 08/14/19 0812 18     Temp 08/14/19 0812 98.5 F (36.9 C)     Temp Source 08/14/19 0812 Oral     SpO2 08/14/19 0812 98 %     Weight 08/14/19 0813 207 lb (93.9 kg)     Height 08/14/19 0813 5\' 2"  (1.575 m)     Head Circumference --      Peak Flow --      Pain Score 08/14/19 0812 5     Pain Loc --      Pain Edu? --      Excl. in GC? --      Constitutional: Alert and oriented. Well appearing and in no acute distress. Eyes: Conjunctivae are normal. PERRL. EOMI. Head: Atraumatic. ENT:      Ears:      Nose: No congestion/rhinnorhea.      Mouth/Throat: Mucous membranes are moist.  Neck: No stridor.  Cardiovascular: Normal rate, regular rhythm.  Good peripheral circulation. Respiratory: Normal respiratory effort without tachypnea or retractions. Lungs CTAB. Good air entry to the bases with no decreased or absent breath sounds. Musculoskeletal: Full range of motion to all extremities. No gross deformities appreciated.  No tenderness to palpation to right hip or right groin.  Tenderness to palpation to mid posterior thigh elicited when patient bends forward or bends to sit on the bed.  Patient able to stand, get herself into bed without any difficulty.  She is able to use her right leg to push herself up into bed.  No leg swelling.  Normal gait. Neurologic:  Normal speech and language. No gross focal neurologic deficits are appreciated.  Skin:  Skin is warm, dry and intact. No rash noted. Psychiatric: Mood and affect are normal. Speech and behavior are normal. Patient exhibits appropriate insight and judgement.   ____________________________________________   LABS (all labs ordered are listed, but only abnormal results are displayed)  Labs Reviewed - No data to  display ____________________________________________  EKG   ____________________________________________  RADIOLOGY 08/16/19, personally viewed and evaluated these images (plain radiographs) as part of my medical decision making, as well as reviewing the written report by the radiologist.  Lexine Baton Venous Img Lower Unilateral Right  Result Date: 08/14/2019 CLINICAL DATA:  Leg swelling and pain EXAM: RIGHT LOWER EXTREMITY VENOUS DOPPLER ULTRASOUND TECHNIQUE: Gray-scale sonography with compression, as well as color and duplex ultrasound, were performed to evaluate the deep venous system(s) from the level of the common femoral vein through the popliteal and proximal calf veins. COMPARISON:  None. FINDINGS: VENOUS Normal compressibility of the common femoral, superficial femoral,  and popliteal veins, as well as the visualized calf veins. Visualized portions of profunda femoral vein and great saphenous vein unremarkable. No filling defects to suggest DVT on grayscale or color Doppler imaging. Doppler waveforms show normal direction of venous flow, normal respiratory plasticity and response to augmentation. Limited views of the contralateral common femoral vein are unremarkable. OTHER None. Limitations: none IMPRESSION: No evidence of right lower extremity DVT. Electronically Signed   By: Macy Mis M.D.   On: 08/14/2019 08:47    ____________________________________________    PROCEDURES  Procedure(s) performed:    Procedures    Medications - No data to display   ____________________________________________   INITIAL IMPRESSION / ASSESSMENT AND PLAN / ED COURSE  Pertinent labs & imaging results that were available during my care of the patient were reviewed by me and considered in my medical decision making (see chart for details).  Review of the [place] CSRS was performed in accordance of the [place] prior to dispensing any controlled drugs.   Patient presented to the emergency  department for evaluation of right posterior thigh pain since yesterday.  Vital signs and exam are reassuring.  Ultrasound negative for DVT.  Patient only has pain when she flexes at the hip to squat or sit down.  I suspect that she has pulled a muscle.  She has not had any trauma or constant pain to suggest fracture.  She is walking without difficulty.  Patient will be discharged home with prescriptions for baclofen and Lidoderm. Patient is to follow up with primary care as directed. Patient is given ED precautions to return to the ED for any worsening or new symptoms.  FABLE HUISMAN was evaluated in Emergency Department on 08/14/2019 for the symptoms described in the history of present illness. She was evaluated in the context of the global COVID-19 pandemic, which necessitated consideration that the patient might be at risk for infection with the SARS-CoV-2 virus that causes COVID-19. Institutional protocols and algorithms that pertain to the evaluation of patients at risk for COVID-19 are in a state of rapid change based on information released by regulatory bodies including the CDC and federal and state organizations. These policies and algorithms were followed during the patient's care in the ED.   ____________________________________________  FINAL CLINICAL IMPRESSION(S) / ED DIAGNOSES  Final diagnoses:  Right thigh pain      NEW MEDICATIONS STARTED DURING THIS VISIT:  ED Discharge Orders         Ordered    Baclofen 5 MG TABS  3 times daily PRN     Discontinue  Reprint     08/14/19 1042    lidocaine (LIDODERM) 5 %  Every 24 hours     Discontinue  Reprint     08/14/19 1042              This chart was dictated using voice recognition software/Dragon. Despite best efforts to proofread, errors can occur which can change the meaning. Any change was purely unintentional.    Laban Emperor, PA-C 08/14/19 1622    Merlyn Lot, MD 08/15/19 [phone]

## 2019-08-14 NOTE — Discharge Instructions (Addendum)
There is no DVT on your ultrasound.  I suspect that you have pulled the muscle in your thigh.  Please begin baclofen to help relax your muscles.  You can apply Lidoderm patch.  Please call your primary care today for a follow-up appointment in 48 hours for recheck.

## 2019-08-14 NOTE — ED Notes (Signed)
See triage note  Presents with swelling to right groin area  States she notice the swelling 2 days ago states she only has discomfort when bending forward  No redness or swelling noted to leg

## 2019-08-14 NOTE — ED Triage Notes (Signed)
Here for right thigh swelling and pain starting yesterday. Denies travel or hormones. Hx dvt, not currently on blood thinners.  NAD. VSS

## 2019-08-15 ENCOUNTER — Other Ambulatory Visit: Payer: Self-pay | Admitting: Nurse Practitioner

## 2019-08-15 ENCOUNTER — Encounter: Payer: Self-pay | Admitting: Nurse Practitioner

## 2019-08-15 ENCOUNTER — Ambulatory Visit: Payer: Medicaid Other | Admitting: Nurse Practitioner

## 2019-08-15 VITALS — BP 128/78 | HR 64 | Temp 98.3°F | Wt 202.0 lb

## 2019-08-15 DIAGNOSIS — Z1231 Encounter for screening mammogram for malignant neoplasm of breast: Secondary | ICD-10-CM | POA: Diagnosis not present

## 2019-08-15 DIAGNOSIS — M79651 Pain in right thigh: Secondary | ICD-10-CM | POA: Diagnosis not present

## 2019-08-15 NOTE — Progress Notes (Signed)
BP 128/78 (BP Location: Left Arm)   Pulse 64   Temp 98.3 F (36.8 C) (Oral)   Wt 202 lb (91.6 kg)   SpO2 97%   BMI 36.95 kg/m    Subjective:    Patient ID: Kristin Solis, female    DOB: 03/02/1955, 64 y.o.   MRN: [id number]  HPI: Kristin Solis is a 64 y.o. female  Chief Complaint  Patient presents with  . Hospitalization Follow-up   ER FOLLOW UP Was seen in ER on 08/14/19 for right thigh pain.  Had a pain in her right inner thigh and felt it was more swollen at time.  US done to r/o DVT -- showed no no right lower extremity DVT.  She has history of DVT to bottom part of right leg several years ago -- was on treatment for a period of time and improved.  No labs were done in ER on this visit.  She reports pain is improving today 1-2/10, only notices when she gets up from sitting -- she reports provider in ER told her she probably pulled a muscle.  She was prescribed Lidocaine patches 5% and Baclofen.   Time since discharge: 1 day Hospital/facility: [facility] Diagnosis: right thigh pain Procedures/tests: ultrasound performed in ER Consultants: none New medications: Baclofen and Lidocaine patches Discharge instructions:  Follow-up with PCP Status: better   Relevant past medical, surgical, family and social history reviewed and updated as indicated. Interim medical history since our last visit reviewed. Allergies and medications reviewed and updated.  Review of Systems  Constitutional: Negative for activity change, appetite change, diaphoresis, fatigue and fever.  Respiratory: Negative for cough, chest tightness, shortness of breath and wheezing.   Cardiovascular: Positive for leg swelling (bilateral baseline). Negative for chest pain and palpitations.  Gastrointestinal: Negative.   Neurological: Negative.   Psychiatric/Behavioral: Negative.     Per HPI unless specifically indicated above     Objective:    BP 128/78 (BP Location: Left Arm)   Pulse 64   Temp 98.3 F  (36.8 C) (Oral)   Wt 202 lb (91.6 kg)   SpO2 97%   BMI 36.95 kg/m   Wt Readings from Last 3 Encounters:  08/15/19 202 lb (91.6 kg)  08/14/19 207 lb (93.9 kg)  05/24/19 210 lb (95.3 kg)    Physical Exam Vitals and nursing note reviewed.  Constitutional:      General: She is awake. She is not in acute distress.    Appearance: She is well-developed and well-groomed. She is obese. She is not ill-appearing.  HENT:     Head: Normocephalic.     Right Ear: Hearing normal.     Left Ear: Hearing normal.  Eyes:     General: Lids are normal.        Right eye: No discharge.        Left eye: No discharge.     Conjunctiva/sclera: Conjunctivae normal.     Pupils: Pupils are equal, round, and reactive to light.  Neck:     Vascular: No carotid bruit.  Cardiovascular:     Rate and Rhythm: Normal rate and regular rhythm.     Heart sounds: Normal heart sounds. No murmur heard.  No gallop.   Pulmonary:     Effort: Pulmonary effort is normal. No accessory muscle usage or respiratory distress.     Breath sounds: Normal breath sounds.  Abdominal:     General: Bowel sounds are normal.     Palpations: Abdomen is  soft.  Musculoskeletal:     Cervical back: Normal range of motion and neck supple.     Right upper leg: Normal. No edema or tenderness.     Left upper leg: Normal. No edema or tenderness.     Right lower leg: No tenderness. Edema (trace) present.     Left lower leg: No tenderness. Edema (trace) present.     Comments: Full ROM BLE, no bruising or erythema or warmth noted to right thigh.  No tenderness to palpation.  Negative Homans.    Skin:    General: Skin is warm and dry.  Neurological:     Mental Status: She is alert and oriented to person, place, and time.  Psychiatric:        Attention and Perception: Attention normal.        Mood and Affect: Mood normal.        Speech: Speech normal.        Behavior: Behavior normal. Behavior is cooperative.        Thought Content: Thought  content normal.     Results for orders placed or performed in visit on 04/10/19  Comprehensive metabolic panel  Result Value Ref Range   Glucose 79 65 - 99 mg/dL   BUN 14 8 - 27 mg/dL   Creatinine, Ser 7.09 0.57 - 1.00 mg/dL   GFR calc non Af Amer 68 >59 mL/min/1.73   GFR calc Af Amer 78 >59 mL/min/1.73   BUN/Creatinine Ratio 15 12 - 28   Sodium 144 134 - 144 mmol/L   Potassium 4.3 3.5 - 5.2 mmol/L   Chloride 107 (H) 96 - 106 mmol/L   CO2 24 20 - 29 mmol/L   Calcium 9.6 8.7 - 10.3 mg/dL   Total Protein 6.5 6.0 - 8.5 g/dL   Albumin 3.7 (L) 3.8 - 4.8 g/dL   Globulin, Total 2.8 1.5 - 4.5 g/dL   Albumin/Globulin Ratio 1.3 1.2 - 2.2   Bilirubin Total 0.4 0.0 - 1.2 mg/dL   Alkaline Phosphatase 117 39 - 117 IU/L   AST 20 0 - 40 IU/L   ALT 19 0 - 32 IU/L  Lipid Panel w/o Chol/HDL Ratio  Result Value Ref Range   Cholesterol, Total 135 100 - 199 mg/dL   Triglycerides 62 0 - 149 mg/dL   HDL 68 >62 mg/dL   VLDL Cholesterol Cal 13 5 - 40 mg/dL   LDL Chol Calc (NIH) 54 0 - 99 mg/dL      Assessment & Plan:   Problem List Items Addressed This Visit      Other   Morbid obesity (HCC)    Recommended eating smaller high protein, low fat meals more frequently and exercising 30 mins a day 5 times a week with a goal of 10-15lb weight loss in the next 3 months. Patient voiced their understanding and motivation to adhere to these recommendations.       Acute pain of right thigh - Primary    Acute and improving with negative DVT work-up in ER yesterday.  Recommend she pick-up Lidocaine patches and Baclofen ordered by ER, use as needed only.  May alternate heat and ice for comfort + perform gentle stretches.  For worsening or ongoing pain return to office.       Other Visit Diagnoses    Encounter for screening mammogram for malignant neoplasm of breast       Mammogram ordered   Relevant Orders   MM DIGITAL SCREENING BILATERAL  Follow up plan: Return if symptoms worsen or fail to  improve.

## 2019-08-15 NOTE — Assessment & Plan Note (Signed)
Acute and improving with negative DVT work-up in ER yesterday.  Recommend she pick-up Lidocaine patches and Baclofen ordered by ER, use as needed only.  May alternate heat and ice for comfort + perform gentle stretches.  For worsening or ongoing pain return to office.

## 2019-08-15 NOTE — Patient Instructions (Addendum)
Please call at this number [facility]) to schedule your mammogram. [phone]   Musculoskeletal Pain Musculoskeletal pain refers to aches and pains in your bones, joints, muscles, and the tissues that surround them. This pain can occur in any part of the body. It can last for a short time (acute) or a long time (chronic). A physical exam, lab tests, and imaging studies may be done to find the cause of your musculoskeletal pain. Follow these instructions at home:  Lifestyle  Try to control or lower your stress levels. Stress increases muscle tension and can worsen musculoskeletal pain. It is important to recognize when you are anxious or stressed and learn ways to manage it. This may include: ? Meditation or yoga. ? Cognitive or behavioral therapy. ? Acupuncture or massage therapy.  You may continue all activities unless the activities cause more pain. When the pain gets better, slowly resume your normal activities. Gradually increase the intensity and duration of your activities or exercise. Managing pain, stiffness, and swelling  Take over-the-counter and prescription medicines only as told by your health care provider.  When your pain is severe, bed rest may be helpful. Lie or sit in any position that is comfortable, but get out of bed and walk around at least every couple of hours.  If directed, apply heat to the affected area as often as told by your health care provider. Use the heat source that your health care provider recommends, such as a moist heat pack or a heating pad. ? Place a towel between your skin and the heat source. ? Leave the heat on for 20-30 minutes. ? Remove the heat if your skin turns bright red. This is especially important if you are unable to feel pain, heat, or cold. You may have a greater risk of getting burned.  If directed, put ice on the painful area. ? Put ice in a plastic bag. ? Place a towel between your skin and the bag. ? Leave  the ice on for 20 minutes, 2-3 times a day. General instructions  Your health care provider may recommend that you see a physical therapist. This person can help you come up with a safe exercise program. Do any exercises as told by your physical therapist.  Keep all follow-up visits, including any physical therapy visits, as told by your health care providers. This is important. Contact a health care provider if:  Your pain gets worse.  Medicines do not help ease your pain.  You cannot use the part of your body that hurts, such as your arm, leg, or neck.  You have trouble sleeping.  You have trouble doing your normal activities. Get help right away if:  You have a new injury and your pain is worse or different.  You feel numb or you have tingling in the painful area. Summary  Musculoskeletal pain refers to aches and pains in your bones, joints, muscles, and the tissues that surround them.  This pain can occur in any part of the body.  Your health care provider may recommend that you see a physical therapist. This person can help you come up with a safe exercise program. Do any exercises as told by your physical therapist.  Lower your stress level. Stress can worsen musculoskeletal pain. Ways to lower stress may include meditation, yoga, cognitive or behavioral therapy, acupuncture, and massage therapy. This information is not intended to replace advice given to you by your health care provider. Make sure you discuss any  questions you have with your health care provider. Document Revised: 01/15/2017 Document Reviewed: 03/04/2016 Elsevier Patient Education  2020 [employer].

## 2019-08-15 NOTE — Assessment & Plan Note (Signed)
Recommended eating smaller high protein, low fat meals more frequently and exercising 30 mins a day 5 times a week with a goal of 10-15lb weight loss in the next 3 months. Patient voiced their understanding and motivation to adhere to these recommendations.  

## 2019-08-17 DIAGNOSIS — I10 Essential (primary) hypertension: Secondary | ICD-10-CM | POA: Diagnosis not present

## 2019-08-17 DIAGNOSIS — R32 Unspecified urinary incontinence: Secondary | ICD-10-CM | POA: Diagnosis not present

## 2019-09-06 ENCOUNTER — Encounter: Payer: Medicaid Other | Admitting: Occupational Therapy

## 2019-09-08 ENCOUNTER — Telehealth: Payer: Self-pay

## 2019-09-08 NOTE — Telephone Encounter (Signed)
Received fax from [employer]. They need progress notes discussing patient's incontinence and supplies.  No recent visit from this or last year documenting. Called and spoke with patient and offered sooner appointment but patient wanted to wait until her F/U 10/09/2019.  Appointment notes updated and form placed in provider's folder.  Routing to provider for [employer]

## 2019-09-11 ENCOUNTER — Ambulatory Visit: Payer: Medicaid Other | Attending: Nurse Practitioner | Admitting: Occupational Therapy

## 2019-09-11 ENCOUNTER — Encounter: Payer: Self-pay | Admitting: Occupational Therapy

## 2019-09-11 ENCOUNTER — Other Ambulatory Visit: Payer: Self-pay

## 2019-09-11 ENCOUNTER — Ambulatory Visit
Admission: RE | Admit: 2019-09-11 | Discharge: 2019-09-11 | Disposition: A | Discharge status: Home / Self Care | Payer: Medicaid Other | Source: Ambulatory Visit | Attending: Nurse Practitioner | Admitting: Nurse Practitioner

## 2019-09-11 DIAGNOSIS — R278 Other lack of coordination: Secondary | ICD-10-CM | POA: Diagnosis present

## 2019-09-11 DIAGNOSIS — Z1231 Encounter for screening mammogram for malignant neoplasm of breast: Secondary | ICD-10-CM | POA: Insufficient documentation

## 2019-09-11 DIAGNOSIS — I69351 Hemiplegia and hemiparesis following cerebral infarction affecting right dominant side: Secondary | ICD-10-CM | POA: Diagnosis present

## 2019-09-11 DIAGNOSIS — M6281 Muscle weakness (generalized): Secondary | ICD-10-CM | POA: Diagnosis present

## 2019-09-11 NOTE — Therapy (Signed)
[place] [facility] SERVICES [address] [place], [place], [address] Phone: [phone]   Fax:  [phone]  Occupational Therapy Treatment  Patient Details  Name: Kristin Solis MRN: [id number] Date of Birth: [birth year] Referring Provider (OT): Marnee Guarneri   Encounter Date: 09/11/2019   OT End of Session - 09/12/19 2138    Visit Number 28    Number of Visits 37    Date for OT Re-Evaluation 10/06/19    Authorization Type Progress reporting period starting 07/12/2019    OT Start Time 1400    OT Stop Time 1458    OT Time Calculation (min) 58 min    Activity Tolerance Patient tolerated treatment well    Behavior During Therapy [facility] for tasks assessed/performed           Past Medical History:  Diagnosis Date   Asthma    Hypertension    Stroke [facility])     Past Surgical History:  Procedure Laterality Date   ABDOMINAL HYSTERECTOMY  2005   total   IR CT HEAD LTD  04/18/2018   IR INTRAVSC STENT CERV CAROTID W/O EMB-PROT MOD SED INC ANGIO  04/18/2018   IR PERCUTANEOUS ART THROMBECTOMY/INFUSION INTRACRANIAL INC DIAG ANGIO  04/18/2018   OOPHORECTOMY     RADIOLOGY WITH ANESTHESIA N/A 04/17/2018   Procedure: RADIOLOGY WITH ANESTHESIA;  Surgeon: Luanne Bras, MD;  Location: [place];  Service: Radiology;  Laterality: N/A;    There were no vitals filed for this visit.   Subjective Assessment - 09/12/19 2137    Subjective  Patient reports she cooked yesterday for Sunday dinner.  Patient reports she has been working on raising her arm, no pain with shoulder flexion this date, also been working on manipulation of coins.    Pertinent History Patient is a 64 y.o. female with history of stroke,  admitted to ED on 04/17/18 with dysphagia and right sided weakness s/p left MCA CVA, extubated on 04/19/18 and discharged on 04/23/18 to private residence    Patient Stated Goals Patient reports she would like to get her right hand to move more and be  able to use for daily tasks.              Therapeutic Exercises: seen for strengthening and ROM in sitting this date with use of 1.5# dowel exercises, shoulder flexion, ABD/ADD, chest press, forwards and backwards circles, for 2 sets of 10 reps each unable to perform scaption, pain with descent from scaption on right.  Therapist guided range towards scaption but patient still has pain in this movement even with guiding.   Grip strength, second setting for 11# of pressure for 2 sets of 12 reps with cues for hand placement on gripper and for sustained gripping patterns to pick up and move items between gripper. Rest breaks as needed.   Neuromuscular Reeducation: Patient seen for manipulation of [place]ue pegboard with small dowels, washers and collars. Patient progressing towards being able to manipulate smaller items 1/2 to 1/4 inch in size but has difficulty with translatory skills of the hand and using the hand for storage.  Instructed on items and movements for home program to work on over the next month.    Response to tx:   Patient continues to demonstrate steady progress in all areas.  She is independent with basic self care tasks and has been improving over the last month with meal preparation and homemaking tasks.  She denied any pain in right UE with  shoulder flexion this date but had some pain with scaption on the right during exercises.  Patient's grip improved to 20 pounds but still needs to work on grip strengthening to move closer to left hand reading of 50 pounds.  Improved fine motor coordination with increased ability to pick up small objects but is still working to refine higher level hand function skills such as translatory movements of the hand and using the hand for storage.  Continue OT towards goals in plan of care to maximize safety and independence in necessary daily tasks.  Patient has one more visit per insurance for this year, will continue to modify program for patient to  follow at home.                   OT Education - 09/12/19 2137    Education Details ROM, coordination skills    Person(s) Educated Patient    Methods Explanation;Demonstration    Comprehension Verbalized understanding;Returned demonstration               OT Long Term Goals - 07/12/19 1044      OT LONG TERM GOAL #1   Title Patient will complete home exercise program with modified independence.    Baseline addition of new exercises ongoing as patient progresses with RUE.    Time 12    Period Weeks    Status On-going      OT LONG TERM GOAL #3   Title Patient will demonstrate improved grip by 10 # in right Hand to be able to hold pitcher in right hand to pour a drink.    Baseline met first goal, progress to holding heavier objects    Time 6    Period Weeks    Status Revised      OT LONG TERM GOAL #5   Title Patient will improve right hand function to be able to grasp pen to sign name on important papers with good legibility.    Baseline Pt. is able to hold and use a standard pen for printing her name, however has difficulty signing her name. Pt. with 75% legibility for printed form with positive deviation above the line. Requiring increased time for letter formation.    Time 12    Period Weeks    Status Achieved      OT LONG TERM GOAL #6   Time 12      OT LONG TERM GOAL #7   Title Patient will improve right hand function to manipulate buttons, snaps, zippers with modified independence.    Baseline Pt. continues to be unable to manipulate buttons.    Time 12    Period Weeks    Status Achieved      OT LONG TERM GOAL #8   Title Patient will demonstrate the ability to stir items in a bowl of varying consistencies with modified independence.    Baseline Pt. continues to have difficulty with heavier consistency items such as cookie dough    Time 12    Period Weeks    Status On-going      OT LONG TERM GOAL  #9   TITLE Patient will demonstrate the ability  to move pots and pans from one location to another in the kitchen with modified independence.    Baseline difficulty with heavier pots and pans.    Time 12    Period Weeks    Status On-going      OT LONG TERM GOAL  #10   TITLE  Patient will improve right shoulder flexion by 10 degrees to assist with reaching and placing objects on table.    Baseline Pt. is improving with right shoulder ROM. Pt. continues to have difficulty placing items on the table.    Time 12    Period Weeks    Status On-going                 Plan - 09/12/19 2139    Clinical Impression Statement Patient continues to demonstrate steady progress in all areas.  She is independent with basic self care tasks and has been improving over the last month with meal preparation and homemaking tasks.  She denied any pain in right UE with shoulder flexion this date but had some pain with scaption on the right during exercises.  Patient's grip improved to 20 pounds but still needs to work on grip strengthening to move closer to left hand reading of 50 pounds.  Improved fine motor coordination with increased ability to pick up small objects but is still working to refine higher level hand function skills such as translatory movements of the hand and using the hand for storage.  Continue OT towards goals in plan of care to maximize safety and independence in necessary daily tasks.  Patient has one more visit per insurance for this year, will continue to modify program for patient to follow at home.    OT Occupational Profile and History Detailed Assessment- Review of Records and additional review of physical, cognitive, psychosocial history related to current functional performance    Occupational performance deficits (Please refer to evaluation for details): ADL's;IADL's;Work;Leisure    Body Structure / Function / Physical Skills ADL;Coordination;GMC;UE functional use;Balance;Decreased knowledge of use of  DME;Flexibility;IADL;Dexterity;FMC;Proprioception;Strength;Edema;ROM    Cognitive Skills Memory    Psychosocial Skills Environmental  Adaptations;Habits;Routines and Behaviors    Rehab Potential Good    Clinical Decision Making Limited treatment options, no task modification necessary    Comorbidities Affecting Occupational Performance: May have comorbidities impacting occupational performance    Modification or Assistance to Complete Evaluation  No modification of tasks or assist necessary to complete eval    OT Frequency Monthly    OT Duration 12 weeks    OT Treatment/Interventions Self-care/ADL training;Cryotherapy;Therapeutic exercise;DME and/or AE instruction;Functional Mobility Training;Cognitive remediation/compensation;Balance training;Electrical Stimulation;Neuromuscular education;Manual Therapy;Splinting;Moist Heat;Contrast Bath;Therapeutic activities;Patient/family education;Passive range of motion    Consulted and Agree with Plan of Care Patient           Patient will benefit from skilled therapeutic intervention in order to improve the following deficits and impairments:   Body Structure / Function / Physical Skills: ADL, Coordination, GMC, UE functional use, Balance, Decreased knowledge of use of DME, Flexibility, IADL, Dexterity, FMC, Proprioception, Strength, Edema, ROM Cognitive Skills: Memory Psychosocial Skills: Environmental  Adaptations, Habits, Routines and Behaviors   Visit Diagnosis: Muscle weakness (generalized)  Other lack of coordination  Hemiplegia and hemiparesis following cerebral infarction affecting right dominant side [facility])    Problem List Patient Active Problem List   Diagnosis Date Noted   Morbid obesity ([place]) 08/15/2019   Acute pain of right thigh 08/15/2019   Mixed hyperlipidemia 09/28/2018   Middle cerebral artery embolism, left 04/18/2018   History of stroke 04/17/2018   IFG (impaired fasting glucose) 03/28/2018   Essential  hypertension, benign 01/15/2017   Asthma 01/15/2017   Obstructive sleep apnea 10/18/2013   Raffaella Edison T Brevin Mcfadden, OTR/L, CLT Sreeja Spies 09/13/2019, 8:47 AM  [place]  [place], [place], [address] Phone: [phone]   Fax:  [phone]  Name: JAZZY PARMER MRN: [id number] Date of Birth: 1955-03-12

## 2019-09-20 NOTE — Telephone Encounter (Signed)
Noted  

## 2019-10-04 ENCOUNTER — Encounter: Payer: Self-pay | Admitting: Occupational Therapy

## 2019-10-04 ENCOUNTER — Ambulatory Visit: Payer: Medicaid Other | Attending: Nurse Practitioner | Admitting: Occupational Therapy

## 2019-10-04 ENCOUNTER — Other Ambulatory Visit: Payer: Self-pay

## 2019-10-04 DIAGNOSIS — M6281 Muscle weakness (generalized): Secondary | ICD-10-CM | POA: Insufficient documentation

## 2019-10-04 DIAGNOSIS — R278 Other lack of coordination: Secondary | ICD-10-CM | POA: Insufficient documentation

## 2019-10-04 NOTE — Therapy (Signed)
[place] MAIN [facility] SERVICES [address] [place], [place], [address] Phone: [phone]   Fax:  [phone]  Occupational Therapy Treatment/Discharge Summary   Patient Details  Name: Kristin Solis MRN: [id number] Date of Birth: [birth year] Referring Provider (OT): Marnee Guarneri   Encounter Date: 10/04/2019   OT End of Session - 10/04/19 1544    Visit Number 29    Number of Visits 37    Date for OT Re-Evaluation 10/06/19    Authorization Type Progress reporting period starting 07/12/2019    OT Start Time 1000    OT Stop Time 1102    OT Time Calculation (min) 62 min    Activity Tolerance Patient tolerated treatment well    Behavior During Therapy [facility] for tasks assessed/performed           Past Medical History:  Diagnosis Date  . Asthma   . Hypertension   . Stroke [facility])     Past Surgical History:  Procedure Laterality Date  . ABDOMINAL HYSTERECTOMY  2005   total  . IR CT HEAD LTD  04/18/2018  . IR INTRAVSC STENT CERV CAROTID W/O EMB-PROT MOD SED INC ANGIO  04/18/2018  . IR PERCUTANEOUS ART THROMBECTOMY/INFUSION INTRACRANIAL INC DIAG ANGIO  04/18/2018  . OOPHORECTOMY    . RADIOLOGY WITH ANESTHESIA N/A 04/17/2018   Procedure: RADIOLOGY WITH ANESTHESIA;  Surgeon: Luanne Bras, MD;  Location: [facility];  Service: Radiology;  Laterality: N/A;    There were no vitals filed for this visit.   Subjective Assessment - 10/04/19 1005    Subjective  Patient reports she is sad she has to stop with therapy due to insurance limits, she feels she is still making progress.  Had some issues with pain last week for several days and felt she couldn't raise her arm more than shoulder height.  Feels a bit better this week.    Pertinent History Patient is a 64 y.o. female with history of stroke,  admitted to ED on 04/17/18 with dysphagia and right sided weakness s/p left MCA CVA, extubated on 04/19/18 and discharged on 04/23/18 to private residence    Patient  Stated Goals Patient reports she would like to get her right hand to move more and be able to use for daily tasks.    Currently in Pain? Yes    Pain Score 1     Pain Location Shoulder    Pain Orientation Right    Pain Descriptors / Indicators Aching    Pain Onset More than a month ago    Pain Frequency Intermittent              [facility] OT Assessment - 10/04/19 1017      Assessment   Medical Diagnosis CVA    Referring Provider (OT) Marnee Guarneri    Onset Date/Surgical Date 04/17/18    Hand Dominance Right    Next MD Visit 09/2019      ADL   Eating/Feeding Modified independent    Grooming Independent    Upper Body Bathing Modified independent    Lower Body Bathing Modified independent    Upper Body Dressing Independent    Lower Body Dressing Independent    Toilet Transfer Modified independent    Toileting - Clothing Manipulation Modified independent    Tub/Shower Transfer Modified independent      Coordination   Right 9 Hole Peg Test 31    Left 9 Hole Peg Test 25      ROM /  Strength   AROM / PROM / Strength AROM      AROM   Right Shoulder Flexion 115 Degrees    Right Shoulder ABduction 95 Degrees      Hand Function   Right Hand Grip (lbs) 30    Right Hand Lateral Pinch 12 lbs    Right Hand 3 Point Pinch 12 lbs    Left Hand Grip (lbs) 53    Left Hand Lateral Pinch 16 lbs    Left 3 point pinch 16 lbs    Comment 2 point pinch right 7#, left 12#           Patient seen for reassessment of skills with measurements, refer to flow sheet for details above.   Goals updated to reflect progress.  Patient seen for right shoulder ROM with use of incline wedge, shoulder flexion, diagonal patterns and square patterns in both directions performed in sitting.   In supine, seen for shoulder stabilization exercises for shoulder flexion to 90 degrees, place and hold.  Place and hold with external perturbations, shoulder protraction, small forwards and backwards circles, ABC  exercise with cues for technique.   Issued and instructed on fine motor coordination/functional hand tasks for HEP, written program for handwriting skills.    Response to tx:   Patient has made excellent progress over the last few months despite the delay in her receiving therapy after her stroke.  She continues to benefit from skilled OT services however with her current insurance she is limited in the number of visits she has per year and has completed all allowed visits.  She has improved overall strength, ROM and coordination skills and using right UE functionally in daily tasks. She does still have some shoulder pain and limitations with right UE ROM with shoulder flexion, ABD, ER.  Her handwriting has progressed and with improved legibility to greater than 90%.  Her grip strength has improved 8# in the last month with use of resistive putty but still is limited compared to left hand (see flowsheet above).  She is independent with home exercise program and will continue with exercises on a daily basis at home.  Will discharge patient at this time with many goals achieved and a few partially achieved.  Patient may benefit from further evaluation and treatment when she is eligible for additional therapy visits.  Reconsult as needed.                   OT Education - 10/04/19 1544    Education Details HEP, goals, outcomes    Person(s) Educated Patient    Methods Explanation;Demonstration    Comprehension Verbalized understanding;Returned demonstration               OT Long Term Goals - 10/04/19 1029      OT LONG TERM GOAL #1   Title Patient will complete home exercise program with modified independence.    Baseline addition of new exercises ongoing as patient progresses with RUE.    Time 12    Period Weeks    Status Achieved      OT LONG TERM GOAL #3   Title Patient will demonstrate improved grip by 10 # in right Hand to be able to hold pitcher in right hand to pour a  drink.    Baseline met first goal, progress to holding heavier objects    Time 6    Period Weeks    Status Achieved      OT LONG TERM  GOAL #5   Title Patient will improve right hand function to be able to grasp pen to sign name on important papers with good legibility.    Baseline Pt. is able to hold and use a standard pen for printing her name, however has difficulty signing her name. Pt. with 75% legibility for printed form with positive deviation above the line. Requiring increased time for letter formation.    Time 12    Period Weeks    Status Achieved      OT LONG TERM GOAL #6   Time 12      OT LONG TERM GOAL #7   Title Patient will improve right hand function to manipulate buttons, snaps, zippers with modified independence.    Baseline Pt. continues to be unable to manipulate buttons.    Time 12    Period Weeks    Status Achieved      OT LONG TERM GOAL #8   Title Patient will demonstrate the ability to stir items in a bowl of varying consistencies with modified independence.    Baseline Pt. continues to have difficulty with heavier consistency items such as cookie dough    Time 12    Period Weeks    Status Partially Met      OT LONG TERM GOAL  #9   TITLE Patient will demonstrate the ability to move pots and pans from one location to another in the kitchen with modified independence.    Baseline difficulty with heavier pots and pans.    Time 12    Period Weeks    Status Partially Met      OT LONG TERM GOAL  #10   TITLE Patient will improve right shoulder flexion by 10 degrees to assist with reaching and placing objects on table.    Baseline Pt. is improving with right shoulder ROM. Pt. continues to have difficulty placing items on the table.    Time 12    Period Weeks    Status Achieved                 Plan - 10/04/19 1546    Clinical Impression Statement Patient has made excellent progress over the last few months despite the delay in her receiving therapy  after her stroke.  She continues to benefit from skilled OT services however with her current insurance she is limited in the number of visits she has per year and has completed all allowed visits.  She has improved overall strength, ROM and coordination skills and using right UE functionally in daily tasks. She does still have some shoulder pain and limitations with right UE ROM with shoulder flexion, ABD, ER.  Her handwriting has progressed and with improved legibility to greater than 90%.  Her grip strength has improved 8# in the last month with use of resistive putty but still is limited compared to left hand (see flowsheet above).  She is independent with home exercise program and will continue with exercises on a daily basis at home.  Will discharge patient at this time with many goals achieved and a few partially achieved.  Patient may benefit from further evaluation and treatment when she is eligible for additional therapy visits.  Reconsult as needed.    OT Occupational Profile and History Detailed Assessment- Review of Records and additional review of physical, cognitive, psychosocial history related to current functional performance    Occupational performance deficits (Please refer to evaluation for details): ADL's;IADL's;Work;Leisure    Body Structure /  Function / Physical Skills ADL;Coordination;GMC;UE functional use;Balance;Decreased knowledge of use of DME;Flexibility;IADL;Dexterity;FMC;Proprioception;Strength;Edema;ROM    Cognitive Skills Memory    Psychosocial Skills Environmental  Adaptations;Habits;Routines and Behaviors    Rehab Potential Good    Clinical Decision Making Limited treatment options, no task modification necessary    Comorbidities Affecting Occupational Performance: May have comorbidities impacting occupational performance    Modification or Assistance to Complete Evaluation  No modification of tasks or assist necessary to complete eval    OT Frequency Monthly    OT  Duration 12 weeks    OT Treatment/Interventions Self-care/ADL training;Cryotherapy;Therapeutic exercise;DME and/or AE instruction;Functional Mobility Training;Cognitive remediation/compensation;Balance training;Electrical Stimulation;Neuromuscular education;Manual Therapy;Splinting;Moist Heat;Contrast Bath;Therapeutic activities;Patient/family education;Passive range of motion    Consulted and Agree with Plan of Care Patient           Patient will benefit from skilled therapeutic intervention in order to improve the following deficits and impairments:   Body Structure / Function / Physical Skills: ADL, Coordination, GMC, UE functional use, Balance, Decreased knowledge of use of DME, Flexibility, IADL, Dexterity, FMC, Proprioception, Strength, Edema, ROM Cognitive Skills: Memory Psychosocial Skills: Environmental  Adaptations, Habits, Routines and Behaviors   Visit Diagnosis: Muscle weakness (generalized)  Other lack of coordination    Problem List Patient Active Problem List   Diagnosis Date Noted  . Morbid obesity ([place]) 08/15/2019  . Acute pain of right thigh 08/15/2019  . Mixed hyperlipidemia 09/28/2018  . Middle cerebral artery embolism, left 04/18/2018  . History of stroke 04/17/2018  . IFG (impaired fasting glucose) 03/28/2018  . Essential hypertension, benign 01/15/2017  . Asthma 01/15/2017  . Obstructive sleep apnea 10/18/2013   Achilles Dunk, OTR/L, CLT  Kristin Solis 10/04/2019, 4:02 PM  [place] MAIN [facility] SERVICES [address] [place], [place], [address] Phone: [phone]   Fax:  [phone]  Name: Kristin Solis MRN: [id number] Date of Birth: [birth year]

## 2019-10-09 ENCOUNTER — Ambulatory Visit (INDEPENDENT_AMBULATORY_CARE_PROVIDER_SITE_OTHER): Payer: Medicaid Other | Admitting: Nurse Practitioner

## 2019-10-09 ENCOUNTER — Encounter: Payer: Self-pay | Admitting: Nurse Practitioner

## 2019-10-09 ENCOUNTER — Other Ambulatory Visit: Payer: Self-pay

## 2019-10-09 ENCOUNTER — Telehealth: Payer: Self-pay | Admitting: Nurse Practitioner

## 2019-10-09 DIAGNOSIS — Z23 Encounter for immunization: Secondary | ICD-10-CM

## 2019-10-09 DIAGNOSIS — E782 Mixed hyperlipidemia: Secondary | ICD-10-CM

## 2019-10-09 DIAGNOSIS — I10 Essential (primary) hypertension: Secondary | ICD-10-CM | POA: Diagnosis not present

## 2019-10-09 DIAGNOSIS — G4733 Obstructive sleep apnea (adult) (pediatric): Secondary | ICD-10-CM

## 2019-10-09 DIAGNOSIS — J4541 Moderate persistent asthma with (acute) exacerbation: Secondary | ICD-10-CM | POA: Diagnosis not present

## 2019-10-09 DIAGNOSIS — Z8673 Personal history of transient ischemic attack (TIA), and cerebral infarction without residual deficits: Secondary | ICD-10-CM | POA: Diagnosis not present

## 2019-10-09 DIAGNOSIS — N393 Stress incontinence (female) (male): Secondary | ICD-10-CM | POA: Diagnosis not present

## 2019-10-09 DIAGNOSIS — R7301 Impaired fasting glucose: Secondary | ICD-10-CM

## 2019-10-09 NOTE — Patient Instructions (Signed)
Healthy Eating Following a healthy eating pattern may help you to achieve and maintain a healthy body weight, reduce the risk of chronic disease, and live a long and productive life. It is important to follow a healthy eating pattern at an appropriate calorie level for your body. Your nutritional needs should be met primarily through food by choosing a variety of nutrient-rich foods. What are tips for following this plan? Reading food labels  Read labels and choose the following: ? Reduced or low sodium. ? Juices with 100% fruit juice. ? Foods with low saturated fats and high polyunsaturated and monounsaturated fats. ? Foods with whole grains, such as whole wheat, cracked wheat, brown rice, and wild rice. ? Whole grains that are fortified with folic acid. This is recommended for women who are pregnant or who want to become pregnant.  Read labels and avoid the following: ? Foods with a lot of added sugars. These include foods that contain brown sugar, corn sweetener, corn syrup, dextrose, fructose, glucose, high-fructose corn syrup, honey, invert sugar, lactose, malt syrup, maltose, molasses, raw sugar, sucrose, trehalose, or turbinado sugar.  Do not eat more than the following amounts of added sugar per day:  6 teaspoons (25 g) for women.  9 teaspoons (38 g) for men. ? Foods that contain processed or refined starches and grains. ? Refined grain products, such as white flour, degermed cornmeal, white bread, and white rice. Shopping  Choose nutrient-rich snacks, such as vegetables, whole fruits, and nuts. Avoid high-calorie and high-sugar snacks, such as potato chips, fruit snacks, and candy.  Use oil-based dressings and spreads on foods instead of solid fats such as butter, stick margarine, or cream cheese.  Limit pre-made sauces, mixes, and "instant" products such as flavored rice, instant noodles, and ready-made pasta.  Try more plant-protein sources, such as tofu, tempeh, black beans,  edamame, lentils, nuts, and seeds.  Explore eating plans such as the Mediterranean diet or vegetarian diet. Cooking  Use oil to saut or stir-fry foods instead of solid fats such as butter, stick margarine, or lard.  Try baking, boiling, grilling, or broiling instead of frying.  Remove the fatty part of meats before cooking.  Steam vegetables in water or broth. Meal planning   At meals, imagine dividing your plate into fourths: ? One-half of your plate is fruits and vegetables. ? One-fourth of your plate is whole grains. ? One-fourth of your plate is protein, especially lean meats, poultry, eggs, tofu, beans, or nuts.  Include low-fat dairy as part of your daily diet. Lifestyle  Choose healthy options in all settings, including home, work, school, restaurants, or stores.  Prepare your food safely: ? Wash your hands after handling raw meats. ? Keep food preparation surfaces clean by regularly washing with hot, soapy water. ? Keep raw meats separate from ready-to-eat foods, such as fruits and vegetables. ? Cook seafood, meat, poultry, and eggs to the recommended internal temperature. ? Store foods at safe temperatures. In general:  Keep cold foods at 59F (4.4C) or below.  Keep hot foods at 159F (60C) or above.  Keep your freezer at [facility] (-17.8C) or below.  Foods are no longer safe to eat when they have been between the temperatures of 40-159F (4.4-60C) for more than 2 hours. What foods should I eat? Fruits Aim to eat 2 cup-equivalents of fresh, canned (in natural juice), or frozen fruits each day. Examples of 1 cup-equivalent of fruit include 1 small apple, 8 large strawberries, 1 cup canned fruit,  cup  dried fruit, or 1 cup 100% juice. Vegetables Aim to eat 2-3 cup-equivalents of fresh and frozen vegetables each day, including different varieties and colors. Examples of 1 cup-equivalent of vegetables include 2 medium carrots, 2 cups raw, leafy greens, 1 cup chopped  vegetable (raw or cooked), or 1 medium baked potato. Grains Aim to eat 6 ounce-equivalents of whole grains each day. Examples of 1 ounce-equivalent of grains include 1 slice of bread, 1 cup ready-to-eat cereal, 3 cups popcorn, or  cup cooked rice, pasta, or cereal. Meats and other proteins Aim to eat 5-6 ounce-equivalents of protein each day. Examples of 1 ounce-equivalent of protein include 1 egg, 1/2 cup nuts or seeds, or 1 tablespoon (16 g) peanut butter. A cut of meat or fish that is the size of a deck of cards is about 3-4 ounce-equivalents.  Of the protein you eat each week, try to have at least 8 ounces come from seafood. This includes salmon, trout, herring, and anchovies. Dairy Aim to eat 3 cup-equivalents of fat-free or low-fat dairy each day. Examples of 1 cup-equivalent of dairy include 1 cup (240 mL) milk, 8 ounces (250 g) yogurt, 1 ounces (44 g) natural cheese, or 1 cup (240 mL) fortified soy milk. Fats and oils  Aim for about 5 teaspoons (21 g) per day. Choose monounsaturated fats, such as canola and olive oils, avocados, peanut butter, and most nuts, or polyunsaturated fats, such as sunflower, corn, and soybean oils, walnuts, pine nuts, sesame seeds, sunflower seeds, and flaxseed. Beverages  Aim for six 8-oz glasses of water per day. Limit coffee to three to five 8-oz cups per day.  Limit caffeinated beverages that have added calories, such as soda and energy drinks.  Limit alcohol intake to no more than 1 drink a day for nonpregnant women and 2 drinks a day for men. One drink equals 12 oz of beer (355 mL), 5 oz of wine (148 mL), or 1 oz of hard liquor (44 mL). Seasoning and other foods  Avoid adding excess amounts of salt to your foods. Try flavoring foods with herbs and spices instead of salt.  Avoid adding sugar to foods.  Try using oil-based dressings, sauces, and spreads instead of solid fats. This information is based on general [place] nutrition guidelines. For more  information, visit [employer]. Exact amounts may vary based on your nutrition needs. Summary  A healthy eating plan may help you to maintain a healthy weight, reduce the risk of chronic diseases, and stay active throughout your life.  Plan your meals. Make sure you eat the right portions of a variety of nutrient-rich foods.  Try baking, boiling, grilling, or broiling instead of frying.  Choose healthy options in all settings, including home, work, school, restaurants, or stores. This information is not intended to replace advice given to you by your health care provider. Make sure you discuss any questions you have with your health care provider. Document Revised: 05/17/2017 Document Reviewed: 05/17/2017 Elsevier Patient Education  [place].

## 2019-10-09 NOTE — Assessment & Plan Note (Addendum)
Chronic, ongoing.  Continue current medication regimen and adjust as needed.  Recent LDL 54, well below neuro goal <70.  Lipid panel today.

## 2019-10-09 NOTE — Assessment & Plan Note (Signed)
Chronic, ongoing.  Continue current medication regimen and adjust as needed.  Consider spirometry next visit.  CBC today.

## 2019-10-09 NOTE — Assessment & Plan Note (Signed)
Much improvement in right upper extremity and right lower extremity strength and function with OT on board.   Praised for dedication to OT and progress.  Placed new referral to OT to continue strengthening in January.

## 2019-10-09 NOTE — Assessment & Plan Note (Signed)
Recheck A1C today, last 5.6%.

## 2019-10-09 NOTE — Assessment & Plan Note (Addendum)
Chronic, ongoing with goal BP at home on readings and on repeat in office today (initial in office elevated, noted at past visits ?white coat).  Continue current medication regimen and adjust as needed.  Continue daily BP checks at home and focus on DASH diet regimen.  Discussed strict goal BP due to history of stroke.  Labs today, TSH and BMP.  Return in 6 months.

## 2019-10-09 NOTE — Telephone Encounter (Signed)
Copied from CRM [id number]. Topic: General - Other >> Oct 09, 2019  4:13 PM Gwenlyn Fudge wrote: Reason for CRM: Pt called stating that she spoke with [facility] [employer] and that they state they will be faxing over forms for PCP to fill out for her sleep mask. She states that she will not be able to receive it until October, but they advised to have the forms filled out and sent back.  Please advise.

## 2019-10-09 NOTE — Assessment & Plan Note (Signed)
Will work on obtaining refills on supplies for patient, uses pads daily.

## 2019-10-09 NOTE — Assessment & Plan Note (Signed)
Recommend use of CPAP.  She is going to follow-up with neurology and reach out to CPAP company about paperwork to obtain.

## 2019-10-09 NOTE — Progress Notes (Signed)
BP 122/74 (BP Location: Left Arm)   Pulse (!) 56   Temp 98.3 F (36.8 C) (Oral)   Wt 209 lb 3.2 oz (94.9 kg)   SpO2 97%   BMI 38.26 kg/m    Subjective:    Patient ID: [name], female    DOB: 06-17-1955, 64 y.o.   MRN: [id number]  HPI: AVEREE HARB is a 64 y.o. female  Chief Complaint  Patient presents with  . Hyperlipidemia  . Hypertension   HYPERTENSION / HYPERLIPIDEMIA Continues on Lisinopril 5 MG, ASA, and Atorvastatin 40 MG daily.  Has history of sleep apnea, does not use CPAP machine, has one at home (was given to her in March 2020 after hospitalization -- she reports needing another mask -- will get in October or November -- [employer] she thinks).  Has history of CVA March 2020 and is followed by neurology, Dr. Pearlean Brownie, saw in April and returns next month.  She has been working with OT and has had significant changes to right upper extremity, now able to move and do many things independently -- she does need more therapy at this time to start in January for further strengthening -- Amy Lovett OT.    History of elevated glucose on labs, but A1C one year ago 5.6%.  Denies any symptoms at this time. Satisfied with current treatment? yes Duration of hypertension: chronic BP monitoring frequency: daily BP range: 119/65 this morning -- on average 130/70 range at home BP medication side effects: no Duration of hyperlipidemia: chronic Cholesterol medication side effects: no Cholesterol supplements: none Medication compliance: good compliance Aspirin: yes Recent stressors: no Recurrent headaches: no Visual changes: no Palpitations: no Dyspnea: no Chest pain: no Lower extremity edema: no Dizzy/lightheaded: no   ASTHMA Uses Advair every day, then Albuterol as needed.   Asthma status: stable Satisfied with current treatment?: yes Albuterol/rescue inhaler frequency: once or twice a month Dyspnea frequency: none Wheezing frequency: none Cough frequency:  none Nocturnal symptom frequency:  Limitation of activity: no Current upper respiratory symptoms: no Triggers: seasonal Home peak flows:none Last Spirometry: unknown Failed/intolerant to following asthma meds: none Asthma meds in past: as above Aerochamber/spacer use: no Visits to ER or Urgent Care in past year: no Pneumovax: unknown Influenza: Up to Date   STRESS INCONTINENCE: In need of refill on incontinence pads today.  Uses these at home every day -- uses about 5-6 a day.  Does dribble a little.  Has been present since 2005 when had hysterectomy.  Does have episodes with coughs or laughs.  Supply sends her 300 pads, but she reports only needing 1/2 of these (150 pads).    Relevant past medical, surgical, family and social history reviewed and updated as indicated. Interim medical history since our last visit reviewed. Allergies and medications reviewed and updated.  Review of Systems  Constitutional: Negative for activity change, appetite change, diaphoresis, fatigue and fever.  Respiratory: Negative for cough, chest tightness and shortness of breath.   Cardiovascular: Negative for chest pain, palpitations and leg swelling.  Gastrointestinal: Negative.   Neurological: Negative for dizziness, syncope, speech difficulty, weakness, light-headedness, numbness and headaches.  Psychiatric/Behavioral: Negative.     Per HPI unless specifically indicated above     Objective:    BP 122/74 (BP Location: Left Arm)   Pulse (!) 56   Temp 98.3 F (36.8 C) (Oral)   Wt 209 lb 3.2 oz (94.9 kg)   SpO2 97%   BMI 38.26 kg/m  Wt Readings from Last 3 Encounters:  10/09/19 209 lb 3.2 oz (94.9 kg)  08/15/19 202 lb (91.6 kg)  08/14/19 207 lb (93.9 kg)    Physical Exam Vitals and nursing note reviewed.  Constitutional:      General: She is awake. She is not in acute distress.    Appearance: She is well-developed. She is obese. She is not ill-appearing.  HENT:     Head: Normocephalic.      Right Ear: Hearing normal.     Left Ear: Hearing normal.  Eyes:     General: Lids are normal.        Right eye: No discharge.        Left eye: No discharge.     Conjunctiva/sclera: Conjunctivae normal.     Pupils: Pupils are equal, round, and reactive to light.  Neck:     Thyroid: No thyromegaly.     Vascular: No carotid bruit.  Cardiovascular:     Rate and Rhythm: Normal rate and regular rhythm.     Heart sounds: Normal heart sounds. No murmur heard.  No gallop.   Pulmonary:     Effort: Pulmonary effort is normal. No accessory muscle usage or respiratory distress.     Breath sounds: Normal breath sounds.  Abdominal:     General: Bowel sounds are normal.     Palpations: Abdomen is soft.  Musculoskeletal:     Right hand: No swelling or tenderness. Normal range of motion (much improved). Decreased strength (mild much improved from previous). Normal sensation.     Left hand: Normal.     Cervical back: Normal range of motion and neck supple.     Right lower leg: No edema.     Left lower leg: No edema.  Skin:    General: Skin is warm and dry.  Neurological:     Mental Status: She is alert and oriented to person, place, and time.  Psychiatric:        Attention and Perception: Attention normal.        Mood and Affect: Mood normal.        Behavior: Behavior normal. Behavior is cooperative.        Thought Content: Thought content normal.        Judgment: Judgment normal.     Results for orders placed or performed in visit on 04/10/19  Comprehensive metabolic panel  Result Value Ref Range   Glucose 79 65 - 99 mg/dL   BUN 14 8 - 27 mg/dL   Creatinine, Ser 3.61 0.57 - 1.00 mg/dL   GFR calc non Af Amer 68 >59 mL/min/1.73   GFR calc Af Amer 78 >59 mL/min/1.73   BUN/Creatinine Ratio 15 12 - 28   Sodium 144 134 - 144 mmol/L   Potassium 4.3 3.5 - 5.2 mmol/L   Chloride 107 (H) 96 - 106 mmol/L   CO2 24 20 - 29 mmol/L   Calcium 9.6 8.7 - 10.3 mg/dL   Total Protein 6.5 6.0 - 8.5  g/dL   Albumin 3.7 (L) 3.8 - 4.8 g/dL   Globulin, Total 2.8 1.5 - 4.5 g/dL   Albumin/Globulin Ratio 1.3 1.2 - 2.2   Bilirubin Total 0.4 0.0 - 1.2 mg/dL   Alkaline Phosphatase 117 39 - 117 IU/L   AST 20 0 - 40 IU/L   ALT 19 0 - 32 IU/L  Lipid Panel w/o Chol/HDL Ratio  Result Value Ref Range   Cholesterol, Total 135 100 - 199 mg/dL  Triglycerides 62 0 - 149 mg/dL   HDL 68 >34 mg/dL   VLDL Cholesterol Cal 13 5 - 40 mg/dL   LDL Chol Calc (NIH) 54 0 - 99 mg/dL      Assessment & Plan:   Problem List Items Addressed This Visit      Cardiovascular and Mediastinum   Essential hypertension, benign    Chronic, ongoing with goal BP at home on readings and on repeat in office today (initial in office elevated, noted at past visits ?white coat).  Continue current medication regimen and adjust as needed.  Continue daily BP checks at home and focus on DASH diet regimen.  Discussed strict goal BP due to history of stroke.  Labs today, TSH and BMP.  Return in 6 months.      Relevant Orders   Basic metabolic panel   TSH     Respiratory   Asthma    Chronic, ongoing.  Continue current medication regimen and adjust as needed.  Consider spirometry next visit.  CBC today.      Relevant Orders   CBC with Differential/Platelet   Obstructive sleep apnea    Recommend use of CPAP.  She is going to follow-up with neurology and reach out to CPAP company about paperwork to obtain.        Endocrine   IFG (impaired fasting glucose)    Recheck A1C today, last 5.6%.       Relevant Orders   HgB A1c     Other   History of stroke    Much improvement in right upper extremity and right lower extremity strength and function with OT on board.   Praised for dedication to OT and progress.  Placed new referral to OT to continue strengthening in January.      Relevant Orders   Ambulatory referral to Occupational Therapy   Mixed hyperlipidemia    Chronic, ongoing.  Continue current medication regimen and  adjust as needed.  Recent LDL 54, well below neuro goal <70.  Lipid panel today.      Relevant Orders   Lipid Panel w/o Chol/HDL Ratio   Morbid obesity (HCC) - Primary    Recommended eating smaller high protein, low fat meals more frequently and exercising 30 mins a day 5 times a week with a goal of 10-15lb weight loss in the next 3 months. Patient voiced their understanding and motivation to adhere to these recommendations.       Stress incontinence    Will work on obtaining refills on supplies for patient, uses pads daily.       Other Visit Diagnoses    Need for Td vaccine       Relevant Orders   Td vaccine greater than or equal to 7yo preservative free IM (Completed)       Follow up plan: Return in about 6 months (around 04/10/2020) for HTN/HLD, POST CVA, ASTHMA, OSA.

## 2019-10-09 NOTE — Assessment & Plan Note (Signed)
Recommended eating smaller high protein, low fat meals more frequently and exercising 30 mins a day 5 times a week with a goal of 10-15lb weight loss in the next 3 months. Patient voiced their understanding and motivation to adhere to these recommendations.  

## 2019-10-10 LAB — LIPID PANEL W/O CHOL/HDL RATIO
Cholesterol, Total: 134 mg/dL (ref 100–199)
HDL: 68 mg/dL (ref >39)
LDL Chol Calc (NIH): 53 mg/dL (ref 0–99)
Triglycerides: 59 mg/dL (ref 0–149)
VLDL Cholesterol Cal: 13 mg/dL (ref 5–40)

## 2019-10-10 LAB — CBC WITH DIFFERENTIAL/PLATELET
Basophils Absolute: 0 10*3/uL (ref 0.0–0.2)
Basos: 1 %
EOS (ABSOLUTE): 0.1 10*3/uL (ref 0.0–0.4)
Eos: 2 %
Hematocrit: 43.6 % (ref 34.0–46.6)
Hemoglobin: 14.2 g/dL (ref 11.1–15.9)
Immature Grans (Abs): 0 10*3/uL (ref 0.0–0.1)
Immature Granulocytes: 0 %
Lymphocytes Absolute: 1.4 10*3/uL (ref 0.7–3.1)
Lymphs: 29 %
MCH: 29.1 pg (ref 26.6–33.0)
MCHC: 32.6 g/dL (ref 31.5–35.7)
MCV: 89 fL (ref 79–97)
Monocytes Absolute: 0.4 10*3/uL (ref 0.1–0.9)
Monocytes: 8 %
Neutrophils Absolute: 3.1 10*3/uL (ref 1.4–7.0)
Neutrophils: 60 %
Platelets: 265 10*3/uL (ref 150–450)
RBC: 4.88 x10E6/uL (ref 3.77–5.28)
RDW: 13.1 % (ref 11.7–15.4)
WBC: 5.1 10*3/uL (ref 3.4–10.8)

## 2019-10-10 LAB — BASIC METABOLIC PANEL
BUN/Creatinine Ratio: 16 (ref 12–28)
BUN: 14 mg/dL (ref 8–27)
CO2: 26 mmol/L (ref 20–29)
Calcium: 9.2 mg/dL (ref 8.7–10.3)
Chloride: 105 mmol/L (ref 96–106)
Creatinine, Ser: 0.9 mg/dL (ref 0.57–1.00)
GFR calc Af Amer: 79 mL/min/{1.73_m2} (ref >59)
GFR calc non Af Amer: 68 mL/min/{1.73_m2} (ref >59)
Glucose: 86 mg/dL (ref 65–99)
Potassium: 4.4 mmol/L (ref 3.5–5.2)
Sodium: 143 mmol/L (ref 134–144)

## 2019-10-10 LAB — HEMOGLOBIN A1C
Est. average glucose Bld gHb Est-mCnc: 114 mg/dL
Hgb A1c MFr Bld: 5.6 % (ref 4.8–5.6)

## 2019-10-10 LAB — TSH: TSH: 1.01 u[IU]/mL (ref 0.450–4.500)

## 2019-10-10 NOTE — Progress Notes (Signed)
Contacted via MyChart  Good morning Kristin Solis!!  All that I can say is your labs are the most perfect ones I have seen today.  Everything looks great!!  You truly are doing amazing.  Keep up the great work!! Keep being awesome!!  Thank you for allowing me to participate in your care. Kindest regards, Garnetta Fedrick

## 2019-10-10 NOTE — Telephone Encounter (Signed)
Form signed and faxed back to [employer].

## 2019-10-10 NOTE — Telephone Encounter (Signed)
Form received and placed in provider's folder for signature. 

## 2019-10-17 ENCOUNTER — Telehealth: Payer: Self-pay | Admitting: General Practice

## 2019-10-17 ENCOUNTER — Ambulatory Visit: Payer: Self-pay | Admitting: General Practice

## 2019-10-17 DIAGNOSIS — Z8673 Personal history of transient ischemic attack (TIA), and cerebral infarction without residual deficits: Secondary | ICD-10-CM

## 2019-10-17 DIAGNOSIS — J4541 Moderate persistent asthma with (acute) exacerbation: Secondary | ICD-10-CM

## 2019-10-17 DIAGNOSIS — E782 Mixed hyperlipidemia: Secondary | ICD-10-CM

## 2019-10-17 DIAGNOSIS — I10 Essential (primary) hypertension: Secondary | ICD-10-CM

## 2019-10-17 NOTE — Chronic Care Management (AMB) (Signed)
Care Management   Follow Up Note   10/17/2019 Name: Kristin Solis MRN: [id number] DOB: January 06, 1956  Referred by: [name], NP Reason for referral : Care Coordination (RNCM follow up for HTN/HLD/past stroke)   Kristin Solis is a 64 y.o. year old female who is a primary care patient of [name], [name], NP. The care management team was consulted for assistance with care management and care coordination needs.    Review of patient status, including review of consultants reports, relevant laboratory and other test results, and collaboration with appropriate care team members and the patient's provider was performed as part of comprehensive patient evaluation and provision of chronic care management services.    SDOH (Social Determinants of Health) assessments performed: Yes See Care Plan activities for detailed interventions related to [facility])     Advanced Directives: See Care Plan and Vyn[employer] application for related entries.   Goals Addressed            This Visit's Progress   . RNCM: Chronic Conditions       CARE PLAN ENTRY (see longtitudinal plan of care for additional care plan information)  Current Barriers:  . Chronic Disease Management support, education, and care coordination needs related to HTN, HLD, Pulmonary Disease, and past stroke   Clinical Goal(s) related to HTN, HLD, Pulmonary Disease, and Past stroke  :  Over the next 120 days, patient will:  . Work with the care management team to address educational, disease management, and care coordination needs  . Begin or continue self health monitoring activities as directed today Measure and record blood pressure 2 times per week and adhere to heart healthy diet . Call provider office for new or worsened signs and symptoms Blood pressure findings outside established parameters and New or worsened symptom related to HLD or after affects of stroke . Call care management team with questions or concerns . Verbalize  basic understanding of patient centered plan of care established today  Interventions related to HTN, HLD, Pulmonary Disease, and past stroke :  . Evaluation of current treatment plans and patient's adherence to plan as established by provider.  The patient is doing well. Continues to work with OT and that will end in August. The patient denies any new concerns. States that she does have pain some in her shoulder when she lifts it up but once she rest it stops hurting. Is pleased with her progress. 10-17-2019: The patient has completed her OT and is doing well. Is doing some crocheting and staying busy. Saw pcp recently and got a good report.  . Assessed patient understanding of disease states.  Has a good understanding of her conditions and how to manage her care. Denies any falls.  10-17-2019: is pleased with her health and well being at this time. Was happy about her lab work being good.  . Assessed patient's education and care coordination needs.  The patient denies any needs at this time from the pcp or CCM team. 10-17-2019: Education on staying hydrated and staying out of the heat. The patient endorses drinking water throughout the day. She is adhering to a heart healthy diet and is watching her portion sizes.  . Evaluation of next appointment. Does not have an appointment with the pcp until 04/11/2020 . Provided disease specific education to patient.  Education on following a heart healthy diet. The patient endorses compliance with dietary restrictions . Evaluation of blood pressure readings. The patient had not taken today but gave [facility]  monthly readings: 120/63, 122/72, 124/69, and 116/62.  The patient denies any episodes of HTN or hypotension.  Steele Sizer with appropriate clinical care team members regarding patient needs  Patient Self Care Activities related to HTN, HLD, Pulmonary Disease, and past stroke :  . Patient is unable to independently self-manage chronic health conditions  Please  see past updates related to this goal by clicking on the "Past Updates" button in the selected goal          Telephone follow up appointment with care management team member scheduled for: 12-26-2019 at 1:45 pm  Alto Denver RN, MSN, CCM Community Care Coordinator [place]  [facility] Mobile: [phone]

## 2019-10-17 NOTE — Patient Instructions (Signed)
Visit Information  Goals Addressed            This Visit's Progress   . RNCM: Chronic Conditions       CARE PLAN ENTRY (see longtitudinal plan of care for additional care plan information)  Current Barriers:  . Chronic Disease Management support, education, and care coordination needs related to HTN, HLD, Pulmonary Disease, and past stroke   Clinical Goal(s) related to HTN, HLD, Pulmonary Disease, and Past stroke  :  Over the next 120 days, patient will:  . Work with the care management team to address educational, disease management, and care coordination needs  . Begin or continue self health monitoring activities as directed today Measure and record blood pressure 2 times per week and adhere to heart healthy diet . Call provider office for new or worsened signs and symptoms Blood pressure findings outside established parameters and New or worsened symptom related to HLD or after affects of stroke . Call care management team with questions or concerns . Verbalize basic understanding of patient centered plan of care established today  Interventions related to HTN, HLD, Pulmonary Disease, and past stroke :  . Evaluation of current treatment plans and patient's adherence to plan as established by provider.  The patient is doing well. Continues to work with OT and that will end in August. The patient denies any new concerns. States that she does have pain some in her shoulder when she lifts it up but once she rest it stops hurting. Is pleased with her progress. 10-17-2019: The patient has completed her OT and is doing well. Is doing some crocheting and staying busy. Saw pcp recently and got a good report.  . Assessed patient understanding of disease states.  Has a good understanding of her conditions and how to manage her care. Denies any falls.  10-17-2019: is pleased with her health and well being at this time. Was happy about her lab work being good.  . Assessed patient's education and care  coordination needs.  The patient denies any needs at this time from the pcp or CCM team. 10-17-2019: Education on staying hydrated and staying out of the heat. The patient endorses drinking water throughout the day. She is adhering to a heart healthy diet and is watching her portion sizes.  . Evaluation of next appointment. Does not have an appointment with the pcp until 04/11/2020 . Provided disease specific education to patient.  Education on following a heart healthy diet. The patient endorses compliance with dietary restrictions . Evaluation of blood pressure readings. The patient had not taken today but gave RNCM monthly readings: 120/63, 122/72, 124/69, and 116/62.  The patient denies any episodes of HTN or hypotension.  Steele Sizer with appropriate clinical care team members regarding patient needs  Patient Self Care Activities related to HTN, HLD, Pulmonary Disease, and past stroke :  . Patient is unable to independently self-manage chronic health conditions  Please see past updates related to this goal by clicking on the "Past Updates" button in the selected goal         Patient verbalizes understanding of instructions provided today.   Telephone follow up appointment with care management team member scheduled for: 12-26-2019 at 1:45 pm  Alto Denver RN, MSN, CCM Community Care Coordinator [place]  [facility] Mobile: [phone]

## 2019-10-18 NOTE — Telephone Encounter (Signed)
Pt is calling and [employer] has not received the form please refax

## 2019-10-19 NOTE — Telephone Encounter (Signed)
Form faxed back to [employer] along with the office visit notes it was requesting.

## 2019-10-19 NOTE — Telephone Encounter (Signed)
PT came in stated she was looking for the form that needed to be faxed, Per Johnson,Joliza fax cam in today and has been left in bin for provider to sign. Pt stated she would like a call when this faxed.

## 2019-10-24 NOTE — Telephone Encounter (Signed)
Spoke to Pt, Pt was upset we have faxed multiple times the documents and [employer] has not received. Pt used foul language and was threatining to come to office. Let office manager know.Called [employer] and was instructed to e-mail documents to [email or web address] and  Faxed  to alternate fax number provider.   Have called [employer] and tried to  verified fax and e-mail were sent and received.Person at [employer] will call back when she has received this. Please let front office know when [employer] has received e-mail and fax.

## 2019-10-24 NOTE — Telephone Encounter (Signed)
Caller name: Marchelle Folks  Relation to pt: [employer]  Call back number: [phone]    Reason for call:  Marchelle Folks from [employer] did not receive the fax mentioned below but did provide an alternate fax, please fax form and notes to [phone].

## 2019-10-25 NOTE — Telephone Encounter (Signed)
Spoke with [employer] and verified they have received fax and email. Lvm to pt to let her know  [employer] did received paper's from our office.

## 2019-10-26 DIAGNOSIS — R32 Unspecified urinary incontinence: Secondary | ICD-10-CM | POA: Diagnosis not present

## 2019-10-26 DIAGNOSIS — N3944 Nocturnal enuresis: Secondary | ICD-10-CM | POA: Diagnosis not present

## 2019-10-26 DIAGNOSIS — Z823 Family history of stroke: Secondary | ICD-10-CM | POA: Diagnosis not present

## 2019-10-27 DIAGNOSIS — Z823 Family history of stroke: Secondary | ICD-10-CM | POA: Diagnosis not present

## 2019-10-27 DIAGNOSIS — R32 Unspecified urinary incontinence: Secondary | ICD-10-CM | POA: Diagnosis not present

## 2019-10-27 DIAGNOSIS — N3944 Nocturnal enuresis: Secondary | ICD-10-CM | POA: Diagnosis not present

## 2019-11-17 DIAGNOSIS — Z823 Family history of stroke: Secondary | ICD-10-CM | POA: Diagnosis not present

## 2019-11-17 DIAGNOSIS — R32 Unspecified urinary incontinence: Secondary | ICD-10-CM | POA: Diagnosis not present

## 2019-11-17 DIAGNOSIS — N3944 Nocturnal enuresis: Secondary | ICD-10-CM | POA: Diagnosis not present

## 2019-11-26 DIAGNOSIS — G4733 Obstructive sleep apnea (adult) (pediatric): Secondary | ICD-10-CM | POA: Diagnosis not present

## 2019-11-27 ENCOUNTER — Other Ambulatory Visit: Payer: Self-pay

## 2019-11-27 ENCOUNTER — Encounter: Payer: Self-pay | Admitting: Adult Health

## 2019-11-27 ENCOUNTER — Ambulatory Visit: Payer: Medicaid Other | Admitting: Adult Health

## 2019-11-27 VITALS — BP 140/88 | HR 58 | Ht 62.0 in | Wt 212.0 lb

## 2019-11-27 DIAGNOSIS — Z9989 Dependence on other enabling machines and devices: Secondary | ICD-10-CM | POA: Diagnosis not present

## 2019-11-27 DIAGNOSIS — I63512 Cerebral infarction due to unspecified occlusion or stenosis of left middle cerebral artery: Secondary | ICD-10-CM

## 2019-11-27 DIAGNOSIS — I6522 Occlusion and stenosis of left carotid artery: Secondary | ICD-10-CM

## 2019-11-27 DIAGNOSIS — I10 Essential (primary) hypertension: Secondary | ICD-10-CM

## 2019-11-27 DIAGNOSIS — E785 Hyperlipidemia, unspecified: Secondary | ICD-10-CM

## 2019-11-27 DIAGNOSIS — G4733 Obstructive sleep apnea (adult) (pediatric): Secondary | ICD-10-CM

## 2019-11-27 NOTE — Progress Notes (Signed)
[facility] [address] [place] [id number] [phone]       OFFICE FOLLOW UP NOTE  [name] Date of Birth:  [birth year] Medical Record Number:  [id number]   Reason for Referral: stroke follow up    CHIEF COMPLAINT:  Chief Complaint  Patient presents with   Follow-up    tx rm   Cerebrovascular Accident    Pt is having no new sx since the last visit    HPI:  Today, 11/27/2019, [name] returns for stroke follow-up unaccompanied.  She has made great progress since prior visit with only minimal residual RUE weakness. Completed therapy for the year as she completed all allowed visits per insurance.  She plans on restarting in January.  She continues to do exercises at home with with continued improvement even doing large size crochet blankets. Continues to ambulate with cane for long distance but does not use in her home. Denies any recent falls. Denies new or worsening stroke/TIA symptoms.  Remains on aspirin 81 mg daily without bleeding or bruising.  Remains on atorvastatin 40 mg daily without myalgias.  Blood pressure today 140/88. Monitors at home which has been stable.  No concerns at this time.    History provided for reference purposes only Update 04/23/2019 JM: [name] is a [age] year old female who is being seen today, 04/23/2019, for stroke follow-up.  She has been doing well from a stroke standpoint with residual mild right hemiparesis but overall greatly improving.  She continues to work with outpatient therapies.  She does endorse ongoing right shoulder pain that limits range of motion but is hopeful that ongoing therapy will be beneficial.  Repeat carotid ultrasound on 12/01/2018 showed left ICA stent less than 50% stenosis and 1 to 39% right ICA stenosis.  Brilinta discontinued as 6 months post stent placement and continues on aspirin alone without bleeding or bruising.  Continues on atorvastatin 40 mg daily myalgias.  Reports that recent  lab work by PCP with lipid panel satisfactory.  Blood pressure today 146/66.  No further concerns at this time.  Stroke admission 04/17/2018: [name] a 64 y.o.femalewith history of asthma and HTN who [facility] with dysphasia and R HP which occurred while talking about a traumatic event. Symptoms completely resolved prior to ED. While in the ED, she began having difficulty speaking again along with dense right hemiparesis. She receivedtPA 04/17/2018 at 2032.CTA showed a dense LM1 occlusion with large ischemic penumbra on perfusion and CTA neck showed a left ICA embolus. She was [facility] and sent to IR for mechanical thrombectomy where she had TICI 3 reperfusion using ambo trap and stent assisted angioplasty of severely stenotic L ICA at the bulb.  Post CT head negative for ICH, mass-effect or midline shift.  MRI brain reviewed and showed multifocal acute left MCA territory infarcts and anterior/posterior border zone infarcts with scattered petechial hemorrhage secondary to large vessel disease.  2D echo unremarkable without cardiac source of embolism identified.  Initiated aspirin 81 mg and Brilinta post stent placement and secondary stroke prevention.  Initially found to be in hypertensive emergency and treated with Cleviprex which stabilized throughout admission and recommended long-term BP goal normotensive range.  LDL 102 and initiated atorvastatin 40 mg daily.  Diagnosed with OSA during inpatient and CPAP device supplied at discharge.  Other stroke risk factors include obesity, prior stroke per imaging and family history of stroke.  She had residual deficits of right hemiparesis but  no residual speech difficulties and discharged home in stable condition with recommendations of home health PT/OT.  Initial visit 06/06/2018 JM: she continues to have residual deficits of right hemiparesis but does endorse improvement and endorses resolution of speech difficulty.  She  did not receive any home health therapies as per patient, her insurance did not cover this.  She has been doing exercises on her own at home.  She has been able to continue ADLs independently but has required assistance with IADLs.  She endorses difficulty writing as she is right-hand dominant.  She is able to ambulate without assistive device within her home but will use a cane or rolling walker for long distance.  During conversation of ongoing deficits and difficulties, patient becomes tearful.  Discussion regarding potential post stroke depression with PHQ 2 score 0.  She denies underlying history of depression or anxiety.  She continues to be motivated for ongoing improvement but becomes upset when she attempts to try to do something with her right side and she is unable to or she speaks about it.  She continues on aspirin and Brilinta without side effects of bleeding or bruising.  Continues on atorvastatin without side effects of myalgias.  Blood pressure monitored at home and typically ranges 120s/70s.  She continues to be compliant with CPAP for OSA management.  She has not scheduled appointment at this time with vascular surgery as recommended at 4 weeks post discharge.  No further concerns at this time.  Denies new or worsening stroke/TIA symptoms.  Update 11/23/2018 Dr. Pearlean Brownie : She returns for follow-up after last visit nearly 6 months ago.  She is accompanied by her husband.  She is doing well and states that her speech difficulties have recovered almost back to baseline.  She occasionally has some hesitation.  She is also regained strength on the right side with the right arm particularly the hand is still weak.  She is able to ambulate independently indoors and not short distances but does use a walker for outdoors and long distances.  She has had no falls or injuries.  She recently got Medicaid and will be starting outpatient physical and occupational therapy in [place] soon.  She is tolerating  aspirin and Brilinta but does has easy bruising.  She has not had any follow-up carotid ultrasound done to image her carotid stent.  She states her blood pressure is under good control and today it is 137/77.  She remains on Lipitor which she is tolerating well without muscle aches and pains and states her primary care physician did check lipid profile a few months ago and it was satisfactory.  She is eating healthy and has lost weight steadily.  She is also active.  She has no new complaints.     ROS:   14 system review of systems performed and negative with exception of those listed in HPI  PMH:  Past Medical History:  Diagnosis Date   Asthma    Hypertension    Stroke [facility])     PSH:  Past Surgical History:  Procedure Laterality Date   ABDOMINAL HYSTERECTOMY  2005   total   IR CT HEAD LTD  04/18/2018   IR INTRAVSC STENT CERV CAROTID W/O EMB-PROT MOD SED INC ANGIO  04/18/2018   IR PERCUTANEOUS ART THROMBECTOMY/INFUSION INTRACRANIAL INC DIAG ANGIO  04/18/2018   OOPHORECTOMY     RADIOLOGY WITH ANESTHESIA N/A 04/17/2018   Procedure: RADIOLOGY WITH ANESTHESIA;  Surgeon: Julieanne Cotton, MD;  Location: [facility] OR;  Service: Radiology;  Laterality: N/A;    Social History:  Social History   Socioeconomic History   Marital status: Married    Spouse name: Not on file   Number of children: Not on file   Years of education: Not on file   Highest education level: Not on file  Occupational History   Not on file  Tobacco Use   Smoking status: Never Smoker   Smokeless tobacco: Never Used  Vaping Use   Vaping Use: Never used  Substance and Sexual Activity   Alcohol use: No   Drug use: No   Sexual activity: Never  Other Topics Concern   Not on file  Social History Narrative   Not on file   Social Determinants of Health   Financial Resource Strain: Low Risk    Difficulty of Paying Living Expenses: Not hard at all  Food Insecurity: No Food Insecurity   Worried  About Programme researcher, broadcasting/film/video in the Last Year: Never true   Ran Out of Food in the Last Year: Never true  Transportation Needs: No Transportation Needs   Lack of Transportation (Medical): No   Lack of Transportation (Non-Medical): No  Physical Activity: Inactive   Days of Exercise per Week: 0 days   Minutes of Exercise per Session: 0 min  Stress: No Stress Concern Present   Feeling of Stress : Only a little  Social Connections: Moderately Isolated   Frequency of Communication with Friends and Family: More than three times a week   Frequency of Social Gatherings with Friends and Family: More than three times a week   Attends Religious Services: Never   Database administrator or Organizations: No   Attends Engineer, structural: Never   Marital Status: Married  Catering manager Violence: Not At Risk   Fear of Current or Ex-Partner: No   Emotionally Abused: No   Physically Abused: No   Sexually Abused: No    Family History:  Family History  Problem Relation Age of Onset   Cancer Mother        lung   Cancer Father        brain   Diabetes Sister    Alcohol abuse Brother    Stroke Son        x2   CAD Maternal Grandmother    Emphysema Paternal Grandfather    Heart disease Sister        CHF   Breast cancer Neg Hx     Medications:   Current Outpatient Medications on File Prior to Visit  Medication Sig Dispense Refill   albuterol (VENTOLIN HFA) 108 (90 Base) MCG/ACT inhaler Inhale 2 puffs into the lungs every 6 (six) hours as needed for wheezing or shortness of breath. 16 g 3   aspirin EC 81 MG EC tablet Take 1 tablet (81 mg total) by mouth daily.     atorvastatin (LIPITOR) 40 MG tablet Take 1 tablet (40 mg total) by mouth daily at 6 PM. 30 tablet 11   fluticasone (FLONASE) 50 MCG/ACT nasal spray Place 2 sprays into both nostrils daily. 48 g 3   Fluticasone-Salmeterol (ADVAIR DISKUS) 250-50 MCG/DOSE AEPB Inhale 1 puff into the lungs 2 (two)  times daily. 240 each 3   lisinopril (ZESTRIL) 5 MG tablet Take 1 tablet (5 mg total) by mouth daily. 90 tablet 3   No current facility-administered medications on file prior to visit.    Allergies:  No Known Allergies   Physical Exam  Vitals:   11/27/19 1429  BP: 140/88  Pulse: (!) 58  Weight: 212 lb (96.2 kg)  Height: 5\' 2"  (1.575 m)   Body mass index is 38.78 kg/m. No exam data present  General: Obese pleasant middle-age Caucasian female, seated, in no evident distress Head: head normocephalic and atraumatic.   Neck: supple with no carotid or supraclavicular bruits Cardiovascular: regular rate and rhythm, no murmurs Musculoskeletal: no deformity Skin:  no rash/petichiae Vascular:  Normal pulses all extremities   Neurologic Exam Mental Status: Awake and fully alert.   Fluent speech and language.  Oriented to place and time. Recent and remote memory intact. Attention span, concentration and fund of knowledge appropriate. Mood and affect appropriate.  Cranial Nerves: Pupils equal, briskly reactive to light. Extraocular movements full without nystagmus. Visual fields full to confrontation. Hearing intact. Facial sensation intact. Face, tongue, palate moves normally and symmetrically.  Motor: Normal bulk and tone and full strength left upper and lower extremity.  Full strength right upper and lower extremity except slightly decreased right hand dexterity.  Great improvement of R shoulder ROM compared to prior visit Sensory.: intact to touch , pinprick , position and vibratory sensation.  Coordination: Rapid alternating movements normal in all extremities except decreased right hand. Finger-to-nose and heel-to-shin performed accurately bilaterally. Gait and Station: Arises from chair without difficulty. Stance is normal. Gait demonstrates normal stride length and balance with use of cane Reflexes: 1+ and symmetric. Toes downgoing.       ASSESSMENT/PLAN: KHALIAH BARNICK is a  64 y.o. year old female here with left MCA infarct status post TPA with TICI 3 reperfusion left ICA rescue stent on 04/17/2018 secondary to large vessel disease extracranial carotid stenosis. Vascular risk factors include HTN, HLD, new diagnosis of OSA, carotid stenosis and obesity.  Repeat carotid ultrasound stable.      1. Left MCA infarct:  a. Residual deficit: Decreased RUE dexterity and imbalance but overall great improvement since prior visit.  Continue to do exercises at home and restart therapy in January as planned.  b. Continue aspirin 81 mg daily  and atorvastatin for secondary stroke prevention.  c. Discussed secondary stroke prevention measures and importance of close PCP follow-up for aggressive stroke risk factor management 2. L ICA stent: Repeat carotid ultrasound for surveillance monitoring 3. HTN: BP goal<130/90.  Stable.  On lisinopril per PCP 4. HLD: LDL goal<70.  On atorvastatin 40 mg daily per PCP 5. OSA: Continue ongoing compliance with CPAP    Follow up in 6 months or call earlier if needed   I spent 30 minutes of face-to-face and non-face-to-face time with patient.  This included previsit chart review, lab review, study review, order entry, electronic health record documentation, patient education and discussion regarding prior stroke, residual deficits, surveillance monitoring of carotid stent, secondary stroke prevention measures and importance of aggressive stroke risk factor management and answered all questions to patient satisfaction   February, AGNP-BC  [facility] [address] [place], [place]  Phone [phone] Fax [phone] Note: This document was prepared with digital dictation and possible smart phrase technology. Any transcriptional errors that result from this process are unintentional.

## 2019-11-27 NOTE — Patient Instructions (Addendum)
Repeat carotid ultrasound for surveillance monitoring of left carotid stent  Continue aspirin 81 mg daily  and atorvastatin for secondary stroke prevention  Continue to follow up with PCP regarding cholesterol and blood pressure management  Maintain strict control of hypertension with blood pressure goal below 130/90 and cholesterol with LDL cholesterol (bad cholesterol) goal below 70 mg/dL.     Followup in the future with me in 6 months or call earlier if needed     Thank you for coming to see [place] at [facility]. I hope we have been able to provide you high quality care today.  You may receive a patient satisfaction survey over the next few weeks. We would appreciate your feedback and comments so that we may continue to improve ourselves and the health of our patients.

## 2019-11-29 NOTE — Progress Notes (Signed)
I agree with the above plan 

## 2019-12-08 ENCOUNTER — Ambulatory Visit (HOSPITAL_COMMUNITY)
Admission: RE | Admit: 2019-12-08 | Discharge: 2019-12-08 | Disposition: A | Discharge status: Home / Self Care | Payer: Medicaid Other | Source: Ambulatory Visit | Attending: Adult Health | Admitting: Adult Health

## 2019-12-08 ENCOUNTER — Other Ambulatory Visit: Payer: Self-pay

## 2019-12-08 DIAGNOSIS — I6522 Occlusion and stenosis of left carotid artery: Secondary | ICD-10-CM | POA: Diagnosis not present

## 2019-12-08 NOTE — Progress Notes (Signed)
Carotid artery duplex completed. Refer to "CV Proc" under chart review to view preliminary results.  12/08/2019 3:33 PM [name]., MHA, RVT, RDCS, RDMS

## 2019-12-11 ENCOUNTER — Other Ambulatory Visit: Payer: Medicaid Other

## 2019-12-12 ENCOUNTER — Telehealth: Payer: Self-pay

## 2019-12-12 NOTE — Telephone Encounter (Signed)
-----   Message from Ihor Austin, NP sent at 12/11/2019  5:10 PM EDT ----- Please advise patient that recent carotid ultrasound showed stable appearance of left carotid stent and minimal stenosis or narrowing in right carotid

## 2019-12-12 NOTE — Telephone Encounter (Signed)
Kristin Solis is a 64 y.o. female was contacted and notified of the message below. Pt has no additional questions or concerns.

## 2019-12-26 ENCOUNTER — Telehealth: Payer: Self-pay | Admitting: General Practice

## 2019-12-26 ENCOUNTER — Ambulatory Visit: Payer: Self-pay | Admitting: General Practice

## 2019-12-26 DIAGNOSIS — E782 Mixed hyperlipidemia: Secondary | ICD-10-CM

## 2019-12-26 DIAGNOSIS — I10 Essential (primary) hypertension: Secondary | ICD-10-CM

## 2019-12-26 DIAGNOSIS — Z8673 Personal history of transient ischemic attack (TIA), and cerebral infarction without residual deficits: Secondary | ICD-10-CM

## 2019-12-26 NOTE — Patient Instructions (Signed)
Visit Information  Goals Addressed            This Visit's Progress   . RNCM: Chronic Conditions       CARE PLAN ENTRY (see longtitudinal plan of care for additional care plan information)  Current Barriers:  . Chronic Disease Management support, education, and care coordination needs related to HTN, HLD, Pulmonary Disease, and past stroke   Clinical Goal(s) related to HTN, HLD, Pulmonary Disease, and Past stroke  :  Over the next 120 days, patient will:  . Work with the care management team to address educational, disease management, and care coordination needs  . Begin or continue self health monitoring activities as directed today Measure and record blood pressure 2 times per week and adhere to heart healthy diet . Call provider office for new or worsened signs and symptoms Blood pressure findings outside established parameters and New or worsened symptom related to HLD or after affects of stroke . Call care management team with questions or concerns . Verbalize basic understanding of patient centered plan of care established today  Interventions related to HTN, HLD, Pulmonary Disease, and past stroke :  . Evaluation of current treatment plans and patient's adherence to plan as established by provider.  The patient is doing well. Continues to work with OT and that will end in August. The patient denies any new concerns. States that she does have pain some in her shoulder when she lifts it up but once she rest it stops hurting. Is pleased with her progress. 10-17-2019: The patient has completed her OT and is doing well. Is doing some crocheting and staying busy. Saw pcp recently and got a good report. 12-26-2019: The patient is doing well and is able to use her arm more and more each day. She will have some continued therapy in the new year. The patient is happy to have made the progress that she has.  . Assessed patient understanding of disease states.  Has a good understanding of her  conditions and how to manage her care. Denies any falls.  10-17-2019: is pleased with her health and well being at this time. Was happy about her lab work being good. 12-26-2019: Denies any issues at this time. States everything is stable.  . Assessed patient's education and care coordination needs.  The patient denies any needs at this time from the pcp or CCM team. 12-26-2019: Education on staying hydrated and staying out of the heat. The patient endorses drinking water throughout the day. She is adhering to a heart healthy diet and is watching her portion sizes.  . Evaluation of next appointment. Does not have an appointment with the pcp until 04/11/2020 . Provided disease specific education to patient.  Education on following a heart healthy diet. The patient endorses compliance with dietary restrictions . Evaluation of blood pressure readings. The patient had not taken today but gave RNCM monthly readings: 120/63, 122/72, 124/69, and 116/62.  The patient denies any episodes of HTN or hypotension.  Steele Sizer with appropriate clinical care team members regarding patient needs  Patient Self Care Activities related to HTN, HLD, Pulmonary Disease, and past stroke :  . Patient is unable to independently self-manage chronic health conditions  Please see past updates related to this goal by clicking on the "Past Updates" button in the selected goal         Patient verbalizes understanding of instructions provided today.   Telephone follow up appointment with care management team member scheduled for:  02-20-2020 at 1:45 pm  Alto Denver RN, MSN, CCM Community Care Coordinator [place]  [facility] Mobile: [phone]

## 2019-12-26 NOTE — Chronic Care Management (AMB) (Signed)
Care Management   Follow Up Note   12/26/2019 Name: Kristin Solis MRN: [id number] DOB: 1955-08-23  Referred by: [name], NP Reason for referral : Care Coordination (RNCM Follow up call for Chronic Disease Management and Care Coordination Needs)   Kristin Solis is a 64 y.o. year old female who is a primary care patient of [name], [name], NP. The care management team was consulted for assistance with care management and care coordination needs.    Review of patient status, including review of consultants reports, relevant laboratory and other test results, and collaboration with appropriate care team members and the patient's provider was performed as part of comprehensive patient evaluation and provision of chronic care management services.    SDOH (Social Determinants of Health) assessments performed: Yes See Care Plan activities for detailed interventions related to [facility])     Advanced Directives: See Care Plan and Vynca application for related entries.   Goals Addressed            This Visit's Progress    RNCM: Chronic Conditions       CARE PLAN ENTRY (see longtitudinal plan of care for additional care plan information)  Current Barriers:   Chronic Disease Management support, education, and care coordination needs related to HTN, HLD, Pulmonary Disease, and past stroke   Clinical Goal(s) related to HTN, HLD, Pulmonary Disease, and Past stroke  :  Over the next 120 days, patient will:   Work with the care management team to address educational, disease management, and care coordination needs   Begin or continue self health monitoring activities as directed today Measure and record blood pressure 2 times per week and adhere to heart healthy diet  Call provider office for new or worsened signs and symptoms Blood pressure findings outside established parameters and New or worsened symptom related to HLD or after affects of stroke  Call care management team  with questions or concerns  Verbalize basic understanding of patient centered plan of care established today  Interventions related to HTN, HLD, Pulmonary Disease, and past stroke :   Evaluation of current treatment plans and patient's adherence to plan as established by provider.  The patient is doing well. Continues to work with OT and that will end in August. The patient denies any new concerns. States that she does have pain some in her shoulder when she lifts it up but once she rest it stops hurting. Is pleased with her progress. 10-17-2019: The patient has completed her OT and is doing well. Is doing some crocheting and staying busy. Saw pcp recently and got a good report. 12-26-2019: The patient is doing well and is able to use her arm more and more each day. She will have some continued therapy in the new year. The patient is happy to have made the progress that she has.   Assessed patient understanding of disease states.  Has a good understanding of her conditions and how to manage her care. Denies any falls.  10-17-2019: is pleased with her health and well being at this time. Was happy about her lab work being good. 12-26-2019: Denies any issues at this time. States everything is stable.   Assessed patient's education and care coordination needs.  The patient denies any needs at this time from the pcp or CCM team. 12-26-2019: Education on staying hydrated and staying out of the heat. The patient endorses drinking water throughout the day. She is adhering to a heart healthy diet and is  watching her portion sizes.   Evaluation of next appointment. Does not have an appointment with the pcp until 04/11/2020  Provided disease specific education to patient.  Education on following a heart healthy diet. The patient endorses compliance with dietary restrictions  Evaluation of blood pressure readings. The patient had not taken today but gave RNCM monthly readings: 120/63, 122/72, 124/69, and 116/62.  The  patient denies any episodes of HTN or hypotension.   Collaborated with appropriate clinical care team members regarding patient needs  Patient Self Care Activities related to HTN, HLD, Pulmonary Disease, and past stroke :   Patient is unable to independently self-manage chronic health conditions  Please see past updates related to this goal by clicking on the "Past Updates" button in the selected goal          Telephone follow up appointment with care management team member scheduled for: 02-20-2020 at 1:45 pm  Alto Denver RN, MSN, CCM Community Care Coordinator [place]   [facility] Mobile: [phone]

## 2020-01-17 DIAGNOSIS — Z823 Family history of stroke: Secondary | ICD-10-CM | POA: Diagnosis not present

## 2020-01-17 DIAGNOSIS — N3944 Nocturnal enuresis: Secondary | ICD-10-CM | POA: Diagnosis not present

## 2020-01-17 DIAGNOSIS — G4733 Obstructive sleep apnea (adult) (pediatric): Secondary | ICD-10-CM | POA: Diagnosis not present

## 2020-01-22 ENCOUNTER — Other Ambulatory Visit: Payer: Self-pay

## 2020-01-22 ENCOUNTER — Ambulatory Visit (INDEPENDENT_AMBULATORY_CARE_PROVIDER_SITE_OTHER): Payer: Medicaid Other

## 2020-01-22 DIAGNOSIS — Z23 Encounter for immunization: Secondary | ICD-10-CM

## 2020-02-20 ENCOUNTER — Other Ambulatory Visit: Payer: Self-pay | Admitting: Nurse Practitioner

## 2020-02-20 ENCOUNTER — Telehealth: Payer: Self-pay | Admitting: General Practice

## 2020-02-20 ENCOUNTER — Ambulatory Visit: Payer: Self-pay | Admitting: General Practice

## 2020-02-20 DIAGNOSIS — Z8673 Personal history of transient ischemic attack (TIA), and cerebral infarction without residual deficits: Secondary | ICD-10-CM

## 2020-02-20 DIAGNOSIS — E782 Mixed hyperlipidemia: Secondary | ICD-10-CM

## 2020-02-20 DIAGNOSIS — J4541 Moderate persistent asthma with (acute) exacerbation: Secondary | ICD-10-CM

## 2020-02-20 DIAGNOSIS — I10 Essential (primary) hypertension: Secondary | ICD-10-CM

## 2020-02-20 NOTE — Chronic Care Management (AMB) (Signed)
Chronic Care Management   Follow Up Note   02/20/2020 Name: Kristin Solis MRN: [id number] DOB: 20-Apr-1955  Referred by: [name], NP Reason for referral : Care Coordination (RNCM: Follow up for Chronic Disease Management and Care Coordination Needs)   Kristin Solis is a 65 y.o. year old female who is a primary care patient of [name], [name], NP. The CCM team was consulted for assistance with chronic disease management and care coordination needs.    Review of patient status, including review of consultants reports, relevant laboratory and other test results, and collaboration with appropriate care team members and the patient's provider was performed as part of comprehensive patient evaluation and provision of chronic care management services.    SDOH (Social Determinants of Health) assessments performed: Yes See Care Plan activities for detailed interventions related to [facility])     Outpatient Encounter Medications as of 02/20/2020  Medication Sig Note   albuterol (VENTOLIN HFA) 108 (90 Base) MCG/ACT inhaler Inhale 2 puffs into the lungs every 6 (six) hours as needed for wheezing or shortness of breath. 04/04/2019: PRN; hasn't needed in a while    aspirin EC 81 MG EC tablet Take 1 tablet (81 mg total) by mouth daily.    atorvastatin (LIPITOR) 40 MG tablet Take 1 tablet (40 mg total) by mouth daily at 6 PM.    fluticasone (FLONASE) 50 MCG/ACT nasal spray Place 2 sprays into both nostrils daily.    Fluticasone-Salmeterol (ADVAIR DISKUS) 250-50 MCG/DOSE AEPB Inhale 1 puff into the lungs 2 (two) times daily. 04/04/2019: QAM    lisinopril (ZESTRIL) 5 MG tablet Take 1 tablet (5 mg total) by mouth daily.    No facility-administered encounter medications on file as of 02/20/2020.     Objective:   Goals Addressed            This Visit's Progress    Learn More About My Health       COMPLETED: RNCM: Chronic Conditions       CARE PLAN ENTRY (see longtitudinal plan of care  for additional care plan information)  Current Barriers: Closing this care plan and will open new in ELS platform  Chronic Disease Management support, education, and care coordination needs related to HTN, HLD, Pulmonary Disease, and past stroke   Clinical Goal(s) related to HTN, HLD, Pulmonary Disease, and Past stroke  :  Over the next 120 days, patient will:   Work with the care management team to address educational, disease management, and care coordination needs   Begin or continue self health monitoring activities as directed today Measure and record blood pressure 2 times per week and adhere to heart healthy diet  Call provider office for new or worsened signs and symptoms Blood pressure findings outside established parameters and New or worsened symptom related to HLD or after affects of stroke  Call care management team with questions or concerns  Verbalize basic understanding of patient centered plan of care established today  Interventions related to HTN, HLD, Pulmonary Disease, and past stroke :   Evaluation of current treatment plans and patient's adherence to plan as established by provider.  The patient is doing well. Continues to work with OT and that will end in August. The patient denies any new concerns. States that she does have pain some in her shoulder when she lifts it up but once she rest it stops hurting. Is pleased with her progress. 10-17-2019: The patient has completed her OT and is doing well.  Is doing some crocheting and staying busy. Saw pcp recently and got a good report. 12-26-2019: The patient is doing well and is able to use her arm more and more each day. She will have some continued therapy in the new year. The patient is happy to have made the progress that she has.   Assessed patient understanding of disease states.  Has a good understanding of her conditions and how to manage her care. Denies any falls.  10-17-2019: is pleased with her health and well being  at this time. Was happy about her lab work being good. 12-26-2019: Denies any issues at this time. States everything is stable.   Assessed patient's education and care coordination needs.  The patient denies any needs at this time from the pcp or CCM team. 12-26-2019: Education on staying hydrated and staying out of the heat. The patient endorses drinking water throughout the day. She is adhering to a heart healthy diet and is watching her portion sizes.   Evaluation of next appointment. Does not have an appointment with the pcp until 04/11/2020  Provided disease specific education to patient.  Education on following a heart healthy diet. The patient endorses compliance with dietary restrictions  Evaluation of blood pressure readings. The patient had not taken today but gave RNCM monthly readings: 120/63, 122/72, 124/69, and 116/62.  The patient denies any episodes of HTN or hypotension.   Collaborated with appropriate clinical care team members regarding patient needs  Patient Self Care Activities related to HTN, HLD, Pulmonary Disease, and past stroke :   Patient is unable to independently self-manage chronic health conditions  Please see past updates related to this goal by clicking on the "Past Updates" button in the selected goal         Patient Care Plan: RNCM: Asthma (Adult)    Problem Identified: RNCM: Disease Progression (Asthma)   Priority: Medium    Goal: RNCM: COPD/Asthma Disease Progression Prevented or Minimized   Priority: Medium  Note:   Current Barriers:   Knowledge deficits related to basic understanding of COPD disease process  Knowledge deficits related to basic COPD self care/management  Knowledge deficit related to importance of energy conservation  Limited Social Support  Unable to independently manage Asthma during exacerbation periods   Lacks social connections  Does not contact provider office for questions/concerns  Case Manager Clinical  Goal(s):  Over the next 120 days patient will report using inhalers as prescribed including rinsing mouth after use  Over the next 120 days patient will report utilizing pursed lip breathing for shortness of breath  Over the next 120 days, patient will be able to verbalize understanding of COPD action plan and when to seek appropriate levels of medical care  Over the next 120 days, patient will engage in lite exercise as tolerated to build/regain stamina and strength and reduce shortness of breath through activity tolerance  Over the next 120 days, patient will verbalize basic understanding of COPD disease process and self care activities  Over the next 120 days, patient will not be hospitalized for COPD exacerbation  Interventions:   Provided patient with basic written and verbal COPD education on self care/management/and exacerbation prevention   Provided patient with COPD action plan and reinforced importance of daily self assessment  Provided instruction about proper use of medications used for management of COPD including inhalers  Advised patient to self assesses COPD action plan zone and make appointment with provider if in the yellow zone for 48 hours  without improvement.  Provided patient with education about the role of exercise in the management of COPD  Advised patient to engage in light exercise as tolerated 3-5 days a week  Provided education about and advised patient to utilize infection prevention strategies to reduce risk of respiratory infection  Patient Goals/Self-Care Activities:   - activity or exercise based on tolerance encouraged  - barriers to medication adherence identified  - emotional support provided  - medication-adherence assessment completed  - participation in asthma disease management program encouraged  - screen for functional limitations reviewed  - self-awareness of symptom triggers encouraged  - symptom control monitored  - symptom  log or asthma action plan reviewed  - symptom triggers identified Follow Up Plan: Telephone follow up appointment with care management team member scheduled for:  04-23-2020 at 1 pm   Task: RNCM: Monitor and Manage Adherence to Asthma Therapy   Note:   Care Management Activities:    - activity or exercise based on tolerance encouraged - barriers to medication adherence identified - emotional support provided - medication-adherence assessment completed - participation in asthma disease management program encouraged - screen for functional limitations reviewed - self-awareness of symptom triggers encouraged - symptom control monitored - symptom log or asthma action plan reviewed - symptom triggers identified       Patient Care Plan: RNCM: Management of Stroke (Adult)    Problem Identified: RNCM: Emotional Adjustment to Disease (Stroke)   Priority: Medium    Goal: RNCM: Management of Stroke   Priority: Medium  Note:   Current Barriers:   Knowledge Deficits related to OT therapy restarting for patient with past history of a stroke  Care Coordination needs related to OT in a patient with history of a stroke   Chronic Disease Management support and education needs related to deficits from past stroke   Lacks caregiver support.   Unable to independently manage post stroke care   Does not contact provider office for questions/concerns  Nurse Case Manager Clinical Goal(s):   Over the next 120 days, patient will verbalize understanding of plan for stroke prevention  Over the next 120 days, patient will work with RNCM, pcp and CCM team to address needs related to residual deficits related to past stroke  Over the next 120 days, patient will attend all scheduled medical appointments: 04-11-2020  Over the next 120 days, patient will demonstrate improved adherence to prescribed treatment plan for stroke prevention as evidenced bycalling office for changes, compliance with  medications and heart healthy diet and following the recommendations of the care team.   Interventions:   1:1 collaboration with [name], NP regarding development and update of comprehensive plan of care as evidenced by provider attestation and co-signature  Inter-disciplinary care team collaboration (see longitudinal plan of care)  Evaluation of current treatment plan related to history of stroke  and patient's adherence to plan as established by provider.  Advised patient to call the office if she has not heard back from OT- will discuss referral with the pcp  Provided education to patient re: monitor for changes in condition and to call the provider for new concerns or questions.   Reviewed medications with patient and discussed compliance  Collaborated with pcp regarding the patient needing a new referral for OT because they called her in November for the referral last put in and she needs a new referral for the new year.  She would like to work with Any Tomasita Morrow as she did before.,  Discussed plans with patient for ongoing care management follow up and provided patient with direct contact information for care management team  Reviewed scheduled/upcoming provider appointments including: 04-11-2020  Patient Goals/Self-Care Activities Over the next 120 days, patient will:  - Patient will self administer medications as prescribed Patient will attend all scheduled provider appointments Patient will call pharmacy for medication refills Patient will call provider office for new concerns or questions Patient will work with BSW to address care coordination needs and will continue to work with the clinical team to address health care and disease management related needs.    - decision-making supported - depression screen reviewed - positive reinforcement provided - problem-solving facilitated - relaxation techniques promoted - self-care encouraged - self-reflection promoted -  verbalization of feelings encouraged Follow Up Plan: Telephone follow up appointment with care management team member scheduled for: 04-23-2020 at 1 pm       Task: RNCM: Support Psychosocial Response to Stroke   Note:   Care Management Activities:    - decision-making supported - depression screen reviewed - positive reinforcement provided - problem-solving facilitated - relaxation techniques promoted - self-care encouraged - self-reflection promoted - verbalization of feelings encouraged       Patient Care Plan: RNCM: Hypertension (Adult)    Problem Identified: RNCM: Hypertension (Hypertension)   Priority: Medium    Goal: RNCM: Hypertension Monitored   Priority: Medium  Note:   Objective:   Last practice recorded BP readings:  BP Readings from Last 3 Encounters:  11/27/19 140/88  10/09/19 122/74  08/15/19 128/78     Most recent eGFR/CrCl: No results found for: EGFR  No components found for: CRCL Current Barriers:   Knowledge Deficits related to basic understanding of hypertension pathophysiology and self care management  Knowledge Deficits related to understanding of medications prescribed for management of hypertension  Limited Social Support  Unable to independently manage HTN  Does not contact provider office for questions/concerns Case Manager Clinical Goal(s):   Over the next 120 days, patient will verbalize understanding of plan for hypertension management  Over the next 120 days, patient will attend all scheduled medical appointments: 04-11-2020  Over the next 120 days, patient will demonstrate improved adherence to prescribed treatment plan for hypertension as evidenced by taking all medications as prescribed, monitoring and recording blood pressure as directed, adhering to low sodium/DASH diet  Over the next 120 days, patient will demonstrate improved health management independence as evidenced by checking blood pressure as directed and notifying PCP  if SBP>160 or DBP > 90, taking all medications as prescribe, and adhering to a low sodium diet as discussed. Interventions:   Evaluation of current treatment plan related to hypertension self management and patient's adherence to plan as established by provider.  Provided education to patient re: stroke prevention, s/s of heart attack and stroke, DASH diet, complications of uncontrolled blood pressure  Reviewed medications with patient and discussed importance of compliance  Discussed plans with patient for ongoing care management follow up and provided patient with direct contact information for care management team  Advised patient, providing education and rationale, to monitor blood pressure daily and record, calling PCP for findings outside established parameters.   Reviewed scheduled/upcoming provider appointments including: 04-11-2020  Patient Goals/Self-Care Activities  Over the next 120 days, patient will:  - Calls provider office for new concerns, questions, or BP outside discussed parameters Checks BP and records as discussed Follows a low sodium diet/DASH diet - blood pressure trends reviewed - depression screen reviewed -  home or ambulatory blood pressure monitoring encouraged Follow Up Plan: Telephone follow up appointment with care management team member scheduled for: 04-23-2020 at 1 pm   Task: RNCM: Identify and Monitor Blood Pressure Elevation   Note:   Care Management Activities:    - blood pressure trends reviewed - depression screen reviewed - home or ambulatory blood pressure monitoring encouraged       Patient Care Plan: RNCM: HLD Management    Problem Identified: HLD: Health Promotion or Disease Self-Management (General Plan of Care)   Priority: Medium    Goal: RNCM: HLD: Self-Management Plan Developed   Priority: Medium  Note:   Current Barriers:   Poorly controlled hyperlipidemia, complicated by history of stroke in 2020, HTN  Current  antihyperlipidemic regimen: Lipitor 40 mg daily  Most recent lipid panel:     Component Value Date/Time   CHOL 134 10/09/2019 1120   TRIG 59 10/09/2019 1120   HDL 68 10/09/2019 1120   CHOLHDL 2.8 04/18/2018 0454   VLDL 18 04/18/2018 0454   LDLCALC 53 10/09/2019 1120     ASCVD risk enhancing conditions:  History of Stroke, HTN, Asthma  Unable to independently manage HLD  Does not contact provider office for questions/concerns  RN Care Manager Clinical Goal(s):   Over the next 120 days, patient will work with Consulting civil engineer, providers, and care team towards execution of optimized self-health management plan  Over the next 120 days, patient will verbalize understanding of plan for management of HLD  Over the next 120 days, patient will attend all scheduled medical appointments: 04-11-2020  Interventions:  Medication review performed; medication list updated in electronic medical record.   Inter-disciplinary care team collaboration (see longitudinal plan of care)  Referred to pharmacy team for assistance with HLD medication management  Evaluation of current treatment plan related to HLD and patient's adherence to plan as established by provider.  Advised patient to call the office for any changes in condition or questions  Provided education to patient re: monitoring dietary intake and following heart healthy diet  Reviewed medications with patient and discussed compliance  Discussed plans with patient for ongoing care management follow up and provided patient with direct contact information for care management team  Reviewed scheduled/upcoming provider appointments including: 04-11-2020   Patient Goals/Self-Care Activities:  Over the next 120 days, patient will:   - call for medicine refill 2 or 3 days before it runs out - call if I am sick and can't take my medicine - keep a list of all the medicines I take; vitamins and herbals too - learn to read medicine  labels - use a pillbox to sort medicine - use an alarm clock or phone to remind me to take my medicine - drink 6 to 8 glasses of water each day - eat 3 to 5 servings of fruits and vegetables each day - fill half the plate with nonstarchy vegetables - limit fast food meals to no more than 1 per week - manage portion size - prepare main meal at home 3 to 5 days each week - read food labels for fat, fiber, carbohydrates and portion size - set goal weight - be open to making changes - I can manage, know and watch for signs of a heart attack - if I have chest pain, call for help - learn about small changes that will make a big difference - learn my personal risk factors - barriers to meeting goals identified - change-talk evoked - choices  provided - collaboration with team encouraged - decision-making supported - health risks reviewed - problem-solving facilitated - questions answered - readiness for change evaluated - reassurance provided - resources needed to meet goals identified - self-reflection promoted - self-reliance encouraged - verbalization of feelings encouraged   Follow Up Plan: Telephone follow up appointment with care management team member scheduled for: 04-23-2020 at 1 pm     Task: RNCM: HLD-Mutually Develop and Royce Macadamia Achievement of Patient Goals   Note:   Care Management Activities:    - barriers to meeting goals identified - change-talk evoked - choices provided - collaboration with team encouraged - decision-making supported - health risks reviewed - problem-solving facilitated - questions answered - readiness for change evaluated - reassurance provided - resources needed to meet goals identified - self-reflection promoted - self-reliance encouraged - verbalization of feelings encouraged         Plan:   Telephone follow up appointment with care management team member scheduled for: 04-23-2020 at 1 pm   [place], MSN, [facility] Mobile: [phone]

## 2020-02-20 NOTE — Patient Instructions (Signed)
Visit Information  Patient Care Plan: RNCM: Asthma (Adult)    Problem Identified: RNCM: Disease Progression (Asthma)   Priority: Medium    Goal: RNCM: COPD/Asthma Disease Progression Prevented or Minimized   Priority: Medium  Note:   Current Barriers:  [name] Knowledge deficits related to basic understanding of COPD disease process . Knowledge deficits related to basic COPD self care/management . Knowledge deficit related to importance of energy conservation . Limited Social Support . Unable to independently manage Asthma during exacerbation periods  . Lacks social connections . Does not contact provider office for questions/concerns  Case Manager Clinical Goal(s):  Over the next 120 days patient will report using inhalers as prescribed including rinsing mouth after use  Over the next 120 days patient will report utilizing pursed lip breathing for shortness of breath  Over the next 120 days, patient will be able to verbalize understanding of COPD action plan and when to seek appropriate levels of medical care  Over the next 120 days, patient will engage in lite exercise as tolerated to build/regain stamina and strength and reduce shortness of breath through activity tolerance  Over the next 120 days, patient will verbalize basic understanding of COPD disease process and self care activities  Over the next 120 days, patient will not be hospitalized for COPD exacerbation  Interventions:   Provided patient with basic written and verbal COPD education on self care/management/and exacerbation prevention   Provided patient with COPD action plan and reinforced importance of daily self assessment  Provided instruction about proper use of medications used for management of COPD including inhalers  Advised patient to self assesses COPD action plan zone and make appointment with provider if in the yellow zone for 48 hours without improvement.  Provided patient with education about the role  of exercise in the management of COPD  Advised patient to engage in light exercise as tolerated 3-5 days a week  Provided education about and advised patient to utilize infection prevention strategies to reduce risk of respiratory infection  Patient Goals/Self-Care Activities:  . - activity or exercise based on tolerance encouraged . - barriers to medication adherence identified . - emotional support provided . - medication-adherence assessment completed . - participation in asthma disease management program encouraged . - screen for functional limitations reviewed . - self-awareness of symptom triggers encouraged . - symptom control monitored . - symptom log or asthma action plan reviewed . - symptom triggers identified Follow Up Plan: Telephone follow up appointment with care management team member scheduled for:  04-23-2020 at 1 pm   Task: RNCM: Monitor and Manage Adherence to Asthma Therapy   Note:   Care Management Activities:    - activity or exercise based on tolerance encouraged - barriers to medication adherence identified - emotional support provided - medication-adherence assessment completed - participation in asthma disease management program encouraged - screen for functional limitations reviewed - self-awareness of symptom triggers encouraged - symptom control monitored - symptom log or asthma action plan reviewed - symptom triggers identified       Patient Care Plan: RNCM: Management of Stroke (Adult)    Problem Identified: RNCM: Emotional Adjustment to Disease (Stroke)   Priority: Medium    Goal: RNCM: Management of Stroke   Priority: Medium  Note:   Current Barriers:  [name] Knowledge Deficits related to OT therapy restarting for patient with past history of a stroke . Care Coordination needs related to OT in a patient with history of a stroke  . Chronic  Disease Management support and education needs related to deficits from past stroke  . Lacks caregiver  support.  . Unable to independently manage post stroke care  . Does not contact provider office for questions/concerns  Nurse Case Manager Clinical Goal(s):  [name] Over the next 120 days, patient will verbalize understanding of plan for stroke prevention . Over the next 120 days, patient will work with RNCM, pcp and CCM team to address needs related to residual deficits related to past stroke . Over the next 120 days, patient will attend all scheduled medical appointments: 04-11-2020 . Over the next 120 days, patient will demonstrate improved adherence to prescribed treatment plan for stroke prevention as evidenced bycalling office for changes, compliance with medications and heart healthy diet and following the recommendations of the care team.   Interventions:  . 1:1 collaboration with [name], NP regarding development and update of comprehensive plan of care as evidenced by provider attestation and co-signature . Inter-disciplinary care team collaboration (see longitudinal plan of care) . Evaluation of current treatment plan related to history of stroke  and patient's adherence to plan as established by provider. . Advised patient to call the office if she has not heard back from OT- will discuss referral with the pcp . Provided education to patient re: monitor for changes in condition and to call the provider for new concerns or questions.  . Reviewed medications with patient and discussed compliance . Collaborated with pcp regarding the patient needing a new referral for OT because they called her in November for the referral last put in and she needs a new referral for the new year.  She would like to work with Any Tomasita Morrow as she did before.,  . Discussed plans with patient for ongoing care management follow up and provided patient with direct contact information for care management team . Reviewed scheduled/upcoming provider appointments including: 04-11-2020  Patient Goals/Self-Care  Activities Over the next 120 days, patient will:  - Patient will self administer medications as prescribed Patient will attend all scheduled provider appointments Patient will call pharmacy for medication refills Patient will call provider office for new concerns or questions Patient will work with BSW to address care coordination needs and will continue to work with the clinical team to address health care and disease management related needs.    - decision-making supported - depression screen reviewed - positive reinforcement provided - problem-solving facilitated - relaxation techniques promoted - self-care encouraged - self-reflection promoted - verbalization of feelings encouraged Follow Up Plan: Telephone follow up appointment with care management team member scheduled for: 04-23-2020 at 1 pm       Task: RNCM: Support Psychosocial Response to Stroke   Note:   Care Management Activities:    - decision-making supported - depression screen reviewed - positive reinforcement provided - problem-solving facilitated - relaxation techniques promoted - self-care encouraged - self-reflection promoted - verbalization of feelings encouraged       Patient Care Plan: RNCM: Hypertension (Adult)    Problem Identified: RNCM: Hypertension (Hypertension)   Priority: Medium    Goal: RNCM: Hypertension Monitored   Priority: Medium  Note:   Objective:  . Last practice recorded BP readings:  BP Readings from Last 3 Encounters:  11/27/19 140/88  10/09/19 122/74  08/15/19 128/78 .   [name] Most recent eGFR/CrCl: No results found for: EGFR  No components found for: CRCL Current Barriers:  [name] Knowledge Deficits related to basic understanding of hypertension pathophysiology and self care management .  Knowledge Deficits related to understanding of medications prescribed for management of hypertension . Limited Social Support . Unable to independently manage HTN . Does not contact provider  office for questions/concerns Case Manager Clinical Goal(s):  [name] Over the next 120 days, patient will verbalize understanding of plan for hypertension management . Over the next 120 days, patient will attend all scheduled medical appointments: 04-11-2020 . Over the next 120 days, patient will demonstrate improved adherence to prescribed treatment plan for hypertension as evidenced by taking all medications as prescribed, monitoring and recording blood pressure as directed, adhering to low sodium/DASH diet . Over the next 120 days, patient will demonstrate improved health management independence as evidenced by checking blood pressure as directed and notifying PCP if SBP>160 or DBP > 90, taking all medications as prescribe, and adhering to a low sodium diet as discussed. Interventions:  . Evaluation of current treatment plan related to hypertension self management and patient's adherence to plan as established by provider. . Provided education to patient re: stroke prevention, s/s of heart attack and stroke, DASH diet, complications of uncontrolled blood pressure . Reviewed medications with patient and discussed importance of compliance . Discussed plans with patient for ongoing care management follow up and provided patient with direct contact information for care management team . Advised patient, providing education and rationale, to monitor blood pressure daily and record, calling PCP for findings outside established parameters.  . Reviewed scheduled/upcoming provider appointments including: 04-11-2020  Patient Goals/Self-Care Activities . Over the next 120 days, patient will:  - Calls provider office for new concerns, questions, or BP outside discussed parameters Checks BP and records as discussed Follows a low sodium diet/DASH diet - blood pressure trends reviewed - depression screen reviewed - home or ambulatory blood pressure monitoring encouraged Follow Up Plan: Telephone follow up  appointment with care management team member scheduled for: 04-23-2020 at 1 pm   Task: RNCM: Identify and Monitor Blood Pressure Elevation   Note:   Care Management Activities:    - blood pressure trends reviewed - depression screen reviewed - home or ambulatory blood pressure monitoring encouraged       Patient Care Plan: RNCM: HLD Management    Problem Identified: HLD: Health Promotion or Disease Self-Management (General Plan of Care)   Priority: Medium    Goal: RNCM: HLD: Self-Management Plan Developed   Priority: Medium  Note:   Current Barriers:  . Poorly controlled hyperlipidemia, complicated by history of stroke in 2020, HTN . Current antihyperlipidemic regimen: Lipitor 40 mg daily . Most recent lipid panel:     Component Value Date/Time   CHOL 134 10/09/2019 1120   TRIG 59 10/09/2019 1120   HDL 68 10/09/2019 1120   CHOLHDL 2.8 04/18/2018 0454   VLDL 18 04/18/2018 0454   LDLCALC 53 10/09/2019 1120 .   [name] ASCVD risk enhancing conditions:  History of Stroke, HTN, Asthma . Unable to independently manage HLD . Does not contact provider office for questions/concerns  RN Care Manager Clinical Goal(s):  [name] Over the next 120 days, patient will work with Consulting civil engineer, providers, and care team towards execution of optimized self-health management plan . Over the next 120 days, patient will verbalize understanding of plan for management of HLD . Over the next 120 days, patient will attend all scheduled medical appointments: 04-11-2020  Interventions: [name] Medication review performed; medication list updated in electronic medical record.  Bertram Savin care team collaboration (see longitudinal plan of care) . Referred to pharmacy team for  assistance with HLD medication management . Evaluation of current treatment plan related to HLD and patient's adherence to plan as established by provider. . Advised patient to call the office for any changes in condition or  questions . Provided education to patient re: monitoring dietary intake and following heart healthy diet . Reviewed medications with patient and discussed compliance . Discussed plans with patient for ongoing care management follow up and provided patient with direct contact information for care management team . Reviewed scheduled/upcoming provider appointments including: 04-11-2020   Patient Goals/Self-Care Activities: . Over the next 120 days, patient will:   - call for medicine refill 2 or 3 days before it runs out - call if I am sick and can't take my medicine - keep a list of all the medicines I take; vitamins and herbals too - learn to read medicine labels - use a pillbox to sort medicine - use an alarm clock or phone to remind me to take my medicine - drink 6 to 8 glasses of water each day - eat 3 to 5 servings of fruits and vegetables each day - fill half the plate with nonstarchy vegetables - limit fast food meals to no more than 1 per week - manage portion size - prepare main meal at home 3 to 5 days each week - read food labels for fat, fiber, carbohydrates and portion size - set goal weight - be open to making changes - I can manage, know and watch for signs of a heart attack - if I have chest pain, call for help - learn about small changes that will make a big difference - learn my personal risk factors - barriers to meeting goals identified - change-talk evoked - choices provided - collaboration with team encouraged - decision-making supported - health risks reviewed - problem-solving facilitated - questions answered - readiness for change evaluated - reassurance provided - resources needed to meet goals identified - self-reflection promoted - self-reliance encouraged - verbalization of feelings encouraged   Follow Up Plan: Telephone follow up appointment with care management team member scheduled for: 04-23-2020 at 1 pm     Task: RNCM: HLD-Mutually Develop  and Royce Macadamia Achievement of Patient Goals   Note:   Care Management Activities:    - barriers to meeting goals identified - change-talk evoked - choices provided - collaboration with team encouraged - decision-making supported - health risks reviewed - problem-solving facilitated - questions answered - readiness for change evaluated - reassurance provided - resources needed to meet goals identified - self-reflection promoted - self-reliance encouraged - verbalization of feelings encouraged         The patient verbalized understanding of instructions, educational materials, and care plan provided today and declined offer to receive copy of patient instructions, educational materials, and care plan.   Telephone follow up appointment with care management team member scheduled for: 04-23-2020 at 1 pm  Noreene Larsson RN, MSN, [facility] Mobile: [phone]

## 2020-04-11 ENCOUNTER — Other Ambulatory Visit: Payer: Self-pay

## 2020-04-11 ENCOUNTER — Ambulatory Visit (INDEPENDENT_AMBULATORY_CARE_PROVIDER_SITE_OTHER): Payer: Medicaid Other | Admitting: Nurse Practitioner

## 2020-04-11 ENCOUNTER — Encounter: Payer: Self-pay | Admitting: Nurse Practitioner

## 2020-04-11 ENCOUNTER — Telehealth: Payer: Self-pay

## 2020-04-11 DIAGNOSIS — E782 Mixed hyperlipidemia: Secondary | ICD-10-CM

## 2020-04-11 DIAGNOSIS — I10 Essential (primary) hypertension: Secondary | ICD-10-CM | POA: Diagnosis not present

## 2020-04-11 DIAGNOSIS — J4540 Moderate persistent asthma, uncomplicated: Secondary | ICD-10-CM | POA: Diagnosis not present

## 2020-04-11 DIAGNOSIS — F5101 Primary insomnia: Secondary | ICD-10-CM | POA: Diagnosis not present

## 2020-04-11 DIAGNOSIS — G4733 Obstructive sleep apnea (adult) (pediatric): Secondary | ICD-10-CM

## 2020-04-11 DIAGNOSIS — N393 Stress incontinence (female) (male): Secondary | ICD-10-CM | POA: Diagnosis not present

## 2020-04-11 DIAGNOSIS — R7301 Impaired fasting glucose: Secondary | ICD-10-CM | POA: Diagnosis not present

## 2020-04-11 DIAGNOSIS — G4700 Insomnia, unspecified: Secondary | ICD-10-CM | POA: Insufficient documentation

## 2020-04-11 DIAGNOSIS — Z8673 Personal history of transient ischemic attack (TIA), and cerebral infarction without residual deficits: Secondary | ICD-10-CM

## 2020-04-11 DIAGNOSIS — H9313 Tinnitus, bilateral: Secondary | ICD-10-CM

## 2020-04-11 MED ORDER — FLUTICASONE PROPIONATE 50 MCG/ACT NA SUSP: 2.0000 | Freq: Every day | NASAL | 1 refills | Status: DC

## 2020-04-11 MED ORDER — FLUTICASONE-SALMETEROL 250-50 MCG/DOSE IN AEPB: 1.0000 | INHALATION_SPRAY | Freq: Two times a day (BID) | RESPIRATORY_TRACT | 3 refills | Status: DC

## 2020-04-11 MED ORDER — ATORVASTATIN CALCIUM 40 MG PO TABS
40.0000 mg | ORAL_TABLET | Freq: Every day | ORAL | 4 refills | Status: DC
Start: 2020-04-11 — End: 2020-11-22

## 2020-04-11 MED ORDER — LISINOPRIL 5 MG PO TABS: 5.0000 mg | ORAL_TABLET | Freq: Every day | ORAL | 4 refills | Status: DC

## 2020-04-11 MED ORDER — OMRON WRIST BP MONITOR DEVI: 1 refills | Status: DC

## 2020-04-11 NOTE — Assessment & Plan Note (Signed)
With cerumen impaction noted -- noted impacted cerumen on exam today bilaterally, ears irrigated and small amount of cerumen removed.  Able to view TM post procedure and tolerated well.  Will bring back in 6 weeks to see if improvement, if ongoing send to audiology of ENT.

## 2020-04-11 NOTE — Assessment & Plan Note (Signed)
Chronic, ongoing.  Continue current medication regimen and adjust as needed.  Recent LDL 53, well below neuro goal <70.  Lipid panel today.

## 2020-04-11 NOTE — Patient Instructions (Signed)
Melatonin -- 5 to 10 MG taken 30 minutes before bedtime   Insomnia Insomnia is a sleep disorder that makes it difficult to fall asleep or stay asleep. Insomnia can cause fatigue, low energy, difficulty concentrating, mood swings, and poor performance at work or school. There are three different ways to classify insomnia:  Difficulty falling asleep.  Difficulty staying asleep.  Waking up too early in the morning. Any type of insomnia can be long-term (chronic) or short-term (acute). Both are common. Short-term insomnia usually lasts for three months or less. Chronic insomnia occurs at least three times a week for longer than three months. What are the causes? Insomnia may be caused by another condition, situation, or substance, such as:  Anxiety.  Certain medicines.  Gastroesophageal reflux disease (GERD) or other gastrointestinal conditions.  Asthma or other breathing conditions.  Restless legs syndrome, sleep apnea, or other sleep disorders.  Chronic pain.  Menopause.  Stroke.  Abuse of alcohol, tobacco, or illegal drugs.  Mental health conditions, such as depression.  Caffeine.  Neurological disorders, such as Alzheimer's disease.  An overactive thyroid (hyperthyroidism). Sometimes, the cause of insomnia may not be known. What increases the risk? Risk factors for insomnia include:  Gender. Women are affected more often than men.  Age. Insomnia is more common as you get older.  Stress.  Lack of exercise.  Irregular work schedule or working night shifts.  Traveling between different time zones.  Certain medical and mental health conditions. What are the signs or symptoms? If you have insomnia, the main symptom is having trouble falling asleep or having trouble staying asleep. This may lead to other symptoms, such as:  Feeling fatigued or having low energy.  Feeling nervous about going to sleep.  Not feeling rested in the morning.  Having trouble  concentrating.  Feeling irritable, anxious, or depressed. How is this diagnosed? This condition may be diagnosed based on:  Your symptoms and medical history. Your health care provider may ask about: ? Your sleep habits. ? Any medical conditions you have. ? Your mental health.  A physical exam. How is this treated? Treatment for insomnia depends on the cause. Treatment may focus on treating an underlying condition that is causing insomnia. Treatment may also include:  Medicines to help you sleep.  Counseling or therapy.  Lifestyle adjustments to help you sleep better. Follow these instructions at home: Eating and drinking  Limit or avoid alcohol, caffeinated beverages, and cigarettes, especially close to bedtime. These can disrupt your sleep.  Do not eat a large meal or eat spicy foods right before bedtime. This can lead to digestive discomfort that can make it hard for you to sleep.   Sleep habits  Keep a sleep diary to help you and your health care provider figure out what could be causing your insomnia. Write down: ? When you sleep. ? When you wake up during the night. ? How well you sleep. ? How rested you feel the next day. ? Any side effects of medicines you are taking. ? What you eat and drink.  Make your bedroom a dark, comfortable place where it is easy to fall asleep. ? Put up shades or blackout curtains to block light from outside. ? Use a white noise machine to block noise. ? Keep the temperature cool.  Limit screen use before bedtime. This includes: ? Watching TV. ? Using your smartphone, tablet, or computer.  Stick to a routine that includes going to bed and waking up at the same  times every day and night. This can help you fall asleep faster. Consider making a quiet activity, such as reading, part of your nighttime routine.  Try to avoid taking naps during the day so that you sleep better at night.  Get out of bed if you are still awake after 15 minutes  of trying to sleep. Keep the lights down, but try reading or doing a quiet activity. When you feel sleepy, go back to bed.   General instructions  Take over-the-counter and prescription medicines only as told by your health care provider.  Exercise regularly, as told by your health care provider. Avoid exercise starting several hours before bedtime.  Use relaxation techniques to manage stress. Ask your health care provider to suggest some techniques that may work well for you. These may include: ? Breathing exercises. ? Routines to release muscle tension. ? Visualizing peaceful scenes.  Make sure that you drive carefully. Avoid driving if you feel very sleepy.  Keep all follow-up visits as told by your health care provider. This is important. Contact a health care provider if:  You are tired throughout the day.  You have trouble in your daily routine due to sleepiness.  You continue to have sleep problems, or your sleep problems get worse. Get help right away if:  You have serious thoughts about hurting yourself or someone else. If you ever feel like you may hurt yourself or others, or have thoughts about taking your own life, get help right away. You can go to your nearest emergency department or call:  Your local emergency services (911 in the [place]  A suicide crisis helpline, such as the [place] at (215) 628-8410. This is open 24 hours a day. Summary  Insomnia is a sleep disorder that makes it difficult to fall asleep or stay asleep.  Insomnia can be long-term (chronic) or short-term (acute).  Treatment for insomnia depends on the cause. Treatment may focus on treating an underlying condition that is causing insomnia.  Keep a sleep diary to help you and your health care provider figure out what could be causing your insomnia. This information is not intended to replace advice given to you by your health care provider. Make sure you discuss any  questions you have with your health care provider. Document Revised: 12/14/2019 Document Reviewed: 12/14/2019 Elsevier Patient Education  2021 [employer].

## 2020-04-11 NOTE — Assessment & Plan Note (Signed)
BMI 40.28.  Recommended eating smaller high protein, low fat meals more frequently and exercising 30 mins a day 5 times a week with a goal of 10-15lb weight loss in the next 3 months. Patient voiced their understanding and motivation to adhere to these recommendations.

## 2020-04-11 NOTE — Assessment & Plan Note (Signed)
Much improvement in right upper extremity and right lower extremity strength and function with OT on board.   Praised for dedication to OT and progress.  Placed new referral to OT to continue strengthening.

## 2020-04-11 NOTE — Assessment & Plan Note (Signed)
Ongoing for 2 years since stroke.  Recommend starting out with OTC Melatonin 5 to 10 MG taken 30 minutes before bedtime, discussed this is safe option for her age.  Avoid Benadryl containing products.  Focus on sleep hygiene techniques.  If ongoing consider addition of Trazodone.  Return in 6 weeks.

## 2020-04-11 NOTE — Assessment & Plan Note (Signed)
Recheck A1C today, last 5.6%. Continues to be stable.

## 2020-04-11 NOTE — Assessment & Plan Note (Signed)
Chronic, ongoing.  Continue current medication regimen and adjust as needed.  Consider spirometry next visit.  CBC next visit.

## 2020-04-11 NOTE — Telephone Encounter (Signed)
PA initiated in covermymeds. TMB:[id number]

## 2020-04-11 NOTE — Progress Notes (Signed)
BP 138/80   Pulse 72   Temp 98 F (36.7 C) (Oral)   Wt 220 lb 3.2 oz (99.9 kg)   SpO2 98%   BMI 40.28 kg/m    Subjective:    Patient ID: Kristin Solis, female    DOB: 04/24/1955, 65 y.o.   MRN: [id number]  HPI: Kristin Solis is a 65 y.o. female  Chief Complaint  Patient presents with  . Hypertension    Pt states she has been taking her blood pressures and states it has been doing well for the past two years.   . Asthma    Pt states her asthma has been well as well she hasn't had to use her inhaler in a while, but take her medications and that helps.   HYPERTENSION / HYPERLIPIDEMIA Continues on Lisinopril 5 MG, ASA, and Atorvastatin 40 MG daily.  Has history of sleep apnea, is using but has not used in while, is waiting on small nose mask.  Has history of CVA March 2020 and is followed by neurology, Dr. Pearlean BrownieSethi, saw in October 2021.  She had been working with OT and had significant changes to right upper extremity, now able to move and do many things independently -- she does need more therapy at this time to start in January 2022 for further strengthening -- Amy Lovett OT.    History of elevated glucose on labs, but A1C one year ago 5.6%. Denies any symptoms at this time. Satisfied with current treatment? yes Duration of hypertension: chronic BP monitoring frequency: daily BP range: 117/89 -- varies -- her current BP cuff is 334-64 years old BP medication side effects: no Duration of hyperlipidemia: chronic Cholesterol medication side effects: no Cholesterol supplements: none Medication compliance: good compliance Aspirin: yes Recent stressors: no Recurrent headaches: no Visual changes: no Palpitations: no Dyspnea: no Chest pain: no Lower extremity edema: no Dizzy/lightheaded: no   ASTHMA Uses Advair every day, then Albuterol as needed.   Asthma status: stable Satisfied with current treatment?: yes Albuterol/rescue inhaler frequency: not using Dyspnea frequency:  none Wheezing frequency: none Cough frequency: none Nocturnal symptom frequency:  Limitation of activity: no Current upper respiratory symptoms: no Triggers: seasonal Home peak flows:none Last Spirometry: unknown Failed/intolerant to following asthma meds: none Asthma meds in past: as above Aerochamber/spacer use: no Visits to ER or Urgent Care in past year: no Pneumovax: unknown Influenza: Up to Date   STRESS INCONTINENCE: Uses pads every day.  Uses these at home every day -- uses about 5-6 a day.  Does dribble a little.  Has been present since 2005 when had hysterectomy.  Does have episodes with coughs or laughs.  Supply sends her 300 pads, but she reports only needing 1/2 of these (150 pads).    TINNITUS Has had for long while -- pre stroke. Duration: months Description of tinnitus: ringing -- sometimes high and sometimes low Pulsatile: yes Tinnitus duration: continuous Episode frequency: continous Severity: mild Aggravating factors: none Alleviating factors: none Head injury: no Chronic exposure to loud noises: no Exposure to ototoxic medications: no Vertigo:in past x 1 Hearing loss: no Aural fullness: no Headache:no  TMJ syndrome symptoms: no Unsteady gait: no Postural instability: no Diplopia, dysarthria, dysphagia or weakness: no Anxiety/depression: no   INSOMNIA She is unsure if it has to do with not having her CPAP at this time, slept a little better with CPAP.  Prior to stroke she could go to bed and sleep all night long. Duration: months Satisfied with  sleep quality: no Difficulty falling asleep: no Difficulty staying asleep: yes Waking a few hours after sleep onset: yes Early morning awakenings: yes Daytime hypersomnolence: yes Wakes feeling refreshed: no Good sleep hygiene: yes Apnea: yes Snoring: yes Depressed/anxious mood: no Recent stress: no Restless legs/nocturnal leg cramps: no Chronic pain/arthritis: no History of sleep study:  yes Treatments attempted: none   Relevant past medical, surgical, family and social history reviewed and updated as indicated. Interim medical history since our last visit reviewed. Allergies and medications reviewed and updated.  Review of Systems  Constitutional: Negative for activity change, appetite change, diaphoresis, fatigue and fever.  Respiratory: Negative for cough, chest tightness and shortness of breath.   Cardiovascular: Negative for chest pain, palpitations and leg swelling.  Gastrointestinal: Negative.   Neurological: Negative for dizziness, syncope, speech difficulty, weakness, light-headedness, numbness and headaches.  Psychiatric/Behavioral: Negative.     Per HPI unless specifically indicated above     Objective:    BP 138/80   Pulse 72   Temp 98 F (36.7 C) (Oral)   Wt 220 lb 3.2 oz (99.9 kg)   SpO2 98%   BMI 40.28 kg/m   Wt Readings from Last 3 Encounters:  04/11/20 220 lb 3.2 oz (99.9 kg)  11/27/19 212 lb (96.2 kg)  10/09/19 209 lb 3.2 oz (94.9 kg)    Physical Exam Vitals and nursing note reviewed.  Constitutional:      General: She is awake. She is not in acute distress.    Appearance: She is well-developed. She is obese. She is not ill-appearing.  HENT:     Head: Normocephalic.     Right Ear: Hearing, ear canal and external ear normal. There is impacted cerumen.     Left Ear: Hearing, ear canal and external ear normal. There is impacted cerumen.     Ears:     Comments: Noted impacted cerumen on exam today bilaterally, ears irrigated and small amount of cerumen removed.  Able to view TM post procedure and tolerated well. Eyes:     General: Lids are normal.        Right eye: No discharge.        Left eye: No discharge.     Conjunctiva/sclera: Conjunctivae normal.     Pupils: Pupils are equal, round, and reactive to light.  Neck:     Thyroid: No thyromegaly.     Vascular: No carotid bruit.  Cardiovascular:     Rate and Rhythm: Normal rate and  regular rhythm.     Heart sounds: Normal heart sounds. No murmur heard. No gallop.   Pulmonary:     Effort: Pulmonary effort is normal. No accessory muscle usage or respiratory distress.     Breath sounds: Normal breath sounds.  Abdominal:     General: Bowel sounds are normal.     Palpations: Abdomen is soft.  Musculoskeletal:     Right hand: Normal.     Left hand: Normal.     Cervical back: Normal range of motion and neck supple.     Right lower leg: No edema.     Left lower leg: No edema.  Skin:    General: Skin is warm and dry.  Neurological:     Mental Status: She is alert and oriented to person, place, and time.  Psychiatric:        Attention and Perception: Attention normal.        Mood and Affect: Mood normal.        Behavior:  Behavior normal. Behavior is cooperative.        Thought Content: Thought content normal.        Judgment: Judgment normal.    Results for orders placed or performed in visit on 10/09/19  Basic metabolic panel  Result Value Ref Range   Glucose 86 65 - 99 mg/dL   BUN 14 8 - 27 mg/dL   Creatinine, Ser 6.26 0.57 - 1.00 mg/dL   GFR calc non Af Amer 68 >59 mL/min/1.73   GFR calc Af Amer 79 >59 mL/min/1.73   BUN/Creatinine Ratio 16 12 - 28   Sodium 143 134 - 144 mmol/L   Potassium 4.4 3.5 - 5.2 mmol/L   Chloride 105 96 - 106 mmol/L   CO2 26 20 - 29 mmol/L   Calcium 9.2 8.7 - 10.3 mg/dL  TSH  Result Value Ref Range   TSH 1.010 0.450 - 4.500 uIU/mL  Lipid Panel w/o Chol/HDL Ratio  Result Value Ref Range   Cholesterol, Total 134 100 - 199 mg/dL   Triglycerides 59 0 - 149 mg/dL   HDL 68 >94 mg/dL   VLDL Cholesterol Cal 13 5 - 40 mg/dL   LDL Chol Calc (NIH) 53 0 - 99 mg/dL  CBC with Differential/Platelet  Result Value Ref Range   WBC 5.1 3.4 - 10.8 x10E3/uL   RBC 4.88 3.77 - 5.28 x10E6/uL   Hemoglobin 14.2 11.1 - 15.9 g/dL   Hematocrit 85.4 62.7 - 46.6 %   MCV 89 79 - 97 fL   MCH 29.1 26.6 - 33.0 pg   MCHC 32.6 31.5 - 35.7 g/dL   RDW 03.5  00.9 - 38.1 %   Platelets 265 150 - 450 x10E3/uL   Neutrophils 60 Not Estab. %   Lymphs 29 Not Estab. %   Monocytes 8 Not Estab. %   Eos 2 Not Estab. %   Basos 1 Not Estab. %   Neutrophils Absolute 3.1 1.4 - 7.0 x10E3/uL   Lymphocytes Absolute 1.4 0.7 - 3.1 x10E3/uL   Monocytes Absolute 0.4 0.1 - 0.9 x10E3/uL   EOS (ABSOLUTE) 0.1 0.0 - 0.4 x10E3/uL   Basophils Absolute 0.0 0.0 - 0.2 x10E3/uL   Immature Granulocytes 0 Not Estab. %   Immature Grans (Abs) 0.0 0.0 - 0.1 x10E3/uL  HgB A1c  Result Value Ref Range   Hgb A1c MFr Bld 5.6 4.8 - 5.6 %   Est. average glucose Bld gHb Est-mCnc 114 mg/dL      Assessment & Plan:   Problem List Items Addressed This Visit      Cardiovascular and Mediastinum   Essential hypertension, benign    Chronic, ongoing with goal BP at home on readings and on repeat in office today (initial in office elevated, noted at past visits ?white coat).  Continue current medication regimen and adjust as needed.  Continue daily BP checks at home and focus on DASH diet regimen.  Discussed strict goal BP <130/80 due to history of stroke.  Labs today, CMP.  Refills sent in.  Return in 6 months.      Relevant Medications   lisinopril (ZESTRIL) 5 MG tablet   atorvastatin (LIPITOR) 40 MG tablet   Other Relevant Orders   Comprehensive metabolic panel   Lipid Panel w/o Chol/HDL Ratio     Respiratory   Asthma    Chronic, ongoing.  Continue current medication regimen and adjust as needed.  Consider spirometry next visit.  CBC next visit.      Relevant Medications  Fluticasone-Salmeterol (ADVAIR DISKUS) 250-50 MCG/DOSE AEPB   Obstructive sleep apnea    Recommend use of CPAP 100% of the time, she is waiting on new mask.        Endocrine   IFG (impaired fasting glucose)    Recheck A1C today, last 5.6%. Continues to be stable.        Other   History of stroke    Much improvement in right upper extremity and right lower extremity strength and function with OT on  board.   Praised for dedication to OT and progress.  Placed new referral to OT to continue strengthening.      Relevant Orders   Ambulatory referral to Occupational Therapy   Mixed hyperlipidemia    Chronic, ongoing.  Continue current medication regimen and adjust as needed.  Recent LDL 53, well below neuro goal <70.  Lipid panel today.      Relevant Medications   lisinopril (ZESTRIL) 5 MG tablet   atorvastatin (LIPITOR) 40 MG tablet   Morbid obesity (HCC) - Primary    BMI 40.28.  Recommended eating smaller high protein, low fat meals more frequently and exercising 30 mins a day 5 times a week with a goal of 10-15lb weight loss in the next 3 months. Patient voiced their understanding and motivation to adhere to these recommendations.       Stress incontinence    Continue daily use of pads as needed, consider PT for pelvic floor if worsening.      Tinnitus of both ears    With cerumen impaction noted -- noted impacted cerumen on exam today bilaterally, ears irrigated and small amount of cerumen removed.  Able to view TM post procedure and tolerated well.  Will bring back in 6 weeks to see if improvement, if ongoing send to audiology of ENT.      Insomnia    Ongoing for 2 years since stroke.  Recommend starting out with OTC Melatonin 5 to 10 MG taken 30 minutes before bedtime, discussed this is safe option for her age.  Avoid Benadryl containing products.  Focus on sleep hygiene techniques.  If ongoing consider addition of Trazodone.  Return in 6 weeks.          Follow up plan: Return in about 6 weeks (around 05/23/2020) for Insomnia and tinnitus.

## 2020-04-11 NOTE — Assessment & Plan Note (Signed)
Continue daily use of pads as needed, consider PT for pelvic floor if worsening.

## 2020-04-11 NOTE — Assessment & Plan Note (Addendum)
Chronic, ongoing with goal BP at home on readings and on repeat in office today (initial in office elevated, noted at past visits ?white coat).  Continue current medication regimen and adjust as needed.  Continue daily BP checks at home and focus on DASH diet regimen.  Discussed strict goal BP <130/80 due to history of stroke.  Labs today, CMP.  Refills sent in.  Return in 6 months.

## 2020-04-11 NOTE — Assessment & Plan Note (Signed)
Recommend use of CPAP 100% of the time, she is waiting on new mask.

## 2020-04-12 LAB — COMPREHENSIVE METABOLIC PANEL
ALT: 19 IU/L (ref 0–32)
AST: 21 IU/L (ref 0–40)
Albumin/Globulin Ratio: 1.5 (ref 1.2–2.2)
Albumin: 4 g/dL (ref 3.8–4.8)
Alkaline Phosphatase: 131 IU/L — ABNORMAL HIGH (ref 44–121)
BUN/Creatinine Ratio: 14 (ref 12–28)
BUN: 13 mg/dL (ref 8–27)
Bilirubin Total: 0.5 mg/dL (ref 0.0–1.2)
CO2: 21 mmol/L (ref 20–29)
Calcium: 9.5 mg/dL (ref 8.7–10.3)
Chloride: 108 mmol/L — ABNORMAL HIGH (ref 96–106)
Creatinine, Ser: 0.93 mg/dL (ref 0.57–1.00)
GFR calc Af Amer: 76 mL/min/{1.73_m2} (ref >59)
GFR calc non Af Amer: 66 mL/min/{1.73_m2} (ref >59)
Globulin, Total: 2.7 g/dL (ref 1.5–4.5)
Glucose: 84 mg/dL (ref 65–99)
Potassium: 4.1 mmol/L (ref 3.5–5.2)
Sodium: 146 mmol/L — ABNORMAL HIGH (ref 134–144)
Total Protein: 6.7 g/dL (ref 6.0–8.5)

## 2020-04-12 LAB — LIPID PANEL W/O CHOL/HDL RATIO
Cholesterol, Total: 141 mg/dL (ref 100–199)
HDL: 72 mg/dL (ref >39)
LDL Chol Calc (NIH): 58 mg/dL (ref 0–99)
Triglycerides: 52 mg/dL (ref 0–149)
VLDL Cholesterol Cal: 11 mg/dL (ref 5–40)

## 2020-04-12 NOTE — Progress Notes (Signed)
Contacted via MyChart   Good afternoon Kristin Solis, your labs have returned and kidney and liver function remain normal.  Cholesterol levels at goal for stroke prevention.  Sodium, salt, was a little high.  I would recommend increasing water intake and decreasing salt intake, will recheck next visit.  Any questions? Keep being awesome!!  Thank you for allowing me to participate in your care. Kindest regards, Kamaiyah Uselton

## 2020-04-15 DIAGNOSIS — R32 Unspecified urinary incontinence: Secondary | ICD-10-CM | POA: Diagnosis not present

## 2020-04-15 DIAGNOSIS — Z823 Family history of stroke: Secondary | ICD-10-CM | POA: Diagnosis not present

## 2020-04-16 ENCOUNTER — Telehealth: Payer: Self-pay

## 2020-04-16 NOTE — Telephone Encounter (Signed)
Copied from CRM [id number]. Topic: General - Inquiry >> Apr 16, 2020 11:55 AM Daphine Deutscher D wrote: Reason for CRM: She wants to let Jolene know that she will start OT March 10 at [facility].

## 2020-04-16 NOTE — Telephone Encounter (Signed)
Wonderful, noted.  Very excited for her.

## 2020-04-23 ENCOUNTER — Ambulatory Visit: Payer: Self-pay | Admitting: General Practice

## 2020-04-23 ENCOUNTER — Telehealth: Payer: Self-pay | Admitting: General Practice

## 2020-04-23 DIAGNOSIS — Z8673 Personal history of transient ischemic attack (TIA), and cerebral infarction without residual deficits: Secondary | ICD-10-CM

## 2020-04-23 DIAGNOSIS — E782 Mixed hyperlipidemia: Secondary | ICD-10-CM

## 2020-04-23 DIAGNOSIS — I10 Essential (primary) hypertension: Secondary | ICD-10-CM

## 2020-04-23 DIAGNOSIS — I63232 Cerebral infarction due to unspecified occlusion or stenosis of left carotid arteries: Secondary | ICD-10-CM

## 2020-04-23 DIAGNOSIS — J4540 Moderate persistent asthma, uncomplicated: Secondary | ICD-10-CM

## 2020-04-23 NOTE — Chronic Care Management (AMB) (Signed)
Care Management    RN Visit Note  04/23/2020 Name: Kristin Solis MRN: [id number] DOB: Mar 14, 1955  Subjective: Kristin Solis is a 65 y.o. year old female who is a primary care patient of [name], [name], NP. The care management team was consulted for assistance with disease management and care coordination needs.    Engaged with patient by telephone for follow up visit in response to provider referral for case management and/or care coordination services.   Consent to Services:   Kristin Solis was given information about Care Management services today including:  1. Care Management services includes personalized support from designated clinical staff supervised by her physician, including individualized plan of care and coordination with other care providers 2. 24/7 contact phone numbers for assistance for urgent and routine care needs. 3. The patient may stop case management services at any time by phone call to the office staff.  Patient agreed to services and consent obtained.   Assessment: Review of patient past medical history, allergies, medications, health status, including review of consultants reports, laboratory and other test data, was performed as part of comprehensive evaluation and provision of chronic care management services.   SDOH (Social Determinants of Health) assessments and interventions performed:    Care Plan  No Known Allergies  Outpatient Encounter Medications as of 04/23/2020  Medication Sig Note   albuterol (VENTOLIN HFA) 108 (90 Base) MCG/ACT inhaler Inhale 2 puffs into the lungs every 6 (six) hours as needed for wheezing or shortness of breath. 04/04/2019: PRN; hasn't needed in a while    aspirin EC 81 MG EC tablet Take 1 tablet (81 mg total) by mouth daily.    atorvastatin (LIPITOR) 40 MG tablet Take 1 tablet (40 mg total) by mouth daily at 6 PM.    Blood Pressure Monitoring (OMRON WRIST BP MONITOR) DEVI To check blood pressure once daily due to past  stroke.    fluticasone (FLONASE) 50 MCG/ACT nasal spray Place 2 sprays into both nostrils daily.    Fluticasone-Salmeterol (ADVAIR DISKUS) 250-50 MCG/DOSE AEPB Inhale 1 puff into the lungs 2 (two) times daily.    lisinopril (ZESTRIL) 5 MG tablet Take 1 tablet (5 mg total) by mouth daily.    No facility-administered encounter medications on file as of 04/23/2020.    Patient Active Problem List   Diagnosis Date Noted   Tinnitus of both ears 04/11/2020   Insomnia 04/11/2020   Stress incontinence 10/09/2019   Morbid obesity ([place]) 08/15/2019   Mixed hyperlipidemia 09/28/2018   History of stroke 04/17/2018   IFG (impaired fasting glucose) 03/28/2018   Essential hypertension, benign 01/15/2017   Asthma 01/15/2017   Obstructive sleep apnea 10/18/2013    Conditions to be addressed/monitored: HTN, HLD, COPD and Past history of stroke  Care Plan : RNCM: Asthma (Adult)  Updates made by Vanita Ingles since 04/23/2020 12:00 AM    Problem: RNCM: Disease Progression (Asthma)   Priority: Medium    Goal: RNCM: COPD/Asthma Disease Progression Prevented or Minimized   Priority: Medium  Note:   Current Barriers:   Knowledge deficits related to basic understanding of COPD disease process  Knowledge deficits related to basic COPD self care/management  Knowledge deficit related to importance of energy conservation  Limited Social Support  Unable to independently manage Asthma during exacerbation periods   Lacks social connections  Does not contact provider office for questions/concerns  Case Manager Clinical Goal(s):  Over the next 120 days patient will report using inhalers as prescribed  including rinsing mouth after use  Over the next 120 days patient will report utilizing pursed lip breathing for shortness of breath  Over the next 120 days, patient will be able to verbalize understanding of COPD action plan and when to seek appropriate levels of medical care  Over the  next 120 days, patient will engage in lite exercise as tolerated to build/regain stamina and strength and reduce shortness of breath through activity tolerance  Over the next 120 days, patient will verbalize basic understanding of COPD disease process and self care activities  Over the next 120 days, patient will not be hospitalized for COPD exacerbation  Interventions:   Provided patient with basic written and verbal COPD education on self care/management/and exacerbation prevention   Provided patient with COPD action plan and reinforced importance of daily self assessment  Provided instruction about proper use of medications used for management of COPD including inhalers  Advised patient to self assesses COPD action plan zone and make appointment with provider if in the yellow zone for 48 hours without improvement.  Provided patient with education about the role of exercise in the management of COPD. 04-23-2020: The patient is working with OT for strengthening and is happy because this is helping her.   Advised patient to engage in light exercise as tolerated 3-5 days a week  Provided education about and advised patient to utilize infection prevention strategies to reduce risk of respiratory infection  Patient Goals/Self-Care Activities:   - activity or exercise based on tolerance encouraged  - barriers to medication adherence identified  - emotional support provided  - medication-adherence assessment completed  - participation in asthma disease management program encouraged  - screen for functional limitations reviewed  - self-awareness of symptom triggers encouraged  - symptom control monitored  - symptom log or asthma action plan reviewed  - symptom triggers identified Follow Up Plan: Telephone follow up appointment with care management team member scheduled for:  06-25-2020 at 1 pm   Care Plan : RNCM: Management of Stroke (Adult)  Updates made by Vanita Ingles since  04/23/2020 12:00 AM    Problem: RNCM: Emotional Adjustment to Disease (Stroke)   Priority: Medium    Goal: RNCM: Management of Stroke   Priority: Medium  Note:   Current Barriers:   Knowledge Deficits related to OT therapy restarting for patient with past history of a stroke  Care Coordination needs related to OT in a patient with history of a stroke   Chronic Disease Management support and education needs related to deficits from past stroke   Lacks caregiver support.   Unable to independently manage post stroke care   Does not contact provider office for questions/concerns  Nurse Case Manager Clinical Goal(s):   Over the next 120 days, patient will verbalize understanding of plan for stroke prevention  Over the next 120 days, patient will work with RNCM, pcp and CCM team to address needs related to residual deficits related to past stroke  Over the next 120 days, patient will attend all scheduled medical appointments: 05-23-2020  Over the next 120 days, patient will demonstrate improved adherence to prescribed treatment plan for stroke prevention as evidenced bycalling office for changes, compliance with medications and heart healthy diet and following the recommendations of the care team.   Interventions:   1:1 collaboration with [name], NP regarding development and update of comprehensive plan of care as evidenced by provider attestation and co-signature  Inter-disciplinary care team collaboration (see longitudinal plan  of care)  Evaluation of current treatment plan related to history of stroke  and patient's adherence to plan as established by provider.  Advised patient to call the office if she has not heard back from OT- will discuss referral with the pcp. 04-23-2020: The patient is working with OT and says this is helping her a lot.   Provided education to patient re: monitor for changes in condition and to call the provider for new concerns or questions.    Reviewed medications with patient and discussed compliance  Collaborated with pcp regarding the patient needing a new referral for OT because they called her in November for the referral last put in and she needs a new referral for the new year.  She would like to work with Any Tomasita Morrow as she did before. 04-23-2020: The patient is working with Amy and is happy to be back with her  Discussed plans with patient for ongoing care management follow up and provided patient with direct contact information for care management team  Reviewed scheduled/upcoming provider appointments including: 05-23-2020  Patient Goals/Self-Care Activities Over the next 120 days, patient will:  - Patient will self administer medications as prescribed Patient will attend all scheduled provider appointments Patient will call pharmacy for medication refills Patient will call provider office for new concerns or questions Patient will work with BSW to address care coordination needs and will continue to work with the clinical team to address health care and disease management related needs.    - decision-making supported - depression screen reviewed - positive reinforcement provided - problem-solving facilitated - relaxation techniques promoted - self-care encouraged - self-reflection promoted - verbalization of feelings encouraged Follow Up Plan: Telephone follow up appointment with care management team member scheduled for: 06-25-2020 at 1 pm       Care Plan : RNCM: Hypertension (Adult)  Updates made by Vanita Ingles since 04/23/2020 12:00 AM    Problem: RNCM: Hypertension (Hypertension)   Priority: Medium    Goal: RNCM: Hypertension Monitored   Priority: Medium  Note:   Objective:   Last practice recorded BP readings:   BP Readings from Last 3 Encounters:   04/11/20  138/80   11/27/19  140/88   10/09/19  122/74      Most recent eGFR/CrCl: No results found for: EGFR  No components found for:  CRCL Current Barriers:   Knowledge Deficits related to basic understanding of hypertension pathophysiology and self care management  Knowledge Deficits related to understanding of medications prescribed for management of hypertension  Limited Social Support  Unable to independently manage HTN  Does not contact provider office for questions/concerns Case Manager Clinical Goal(s):   Over the next 120 days, patient will verbalize understanding of plan for hypertension management  Over the next 120 days, patient will attend all scheduled medical appointments: 05-23-2020  Over the next 120 days, patient will demonstrate improved adherence to prescribed treatment plan for hypertension as evidenced by taking all medications as prescribed, monitoring and recording blood pressure as directed, adhering to low sodium/DASH diet  Over the next 120 days, patient will demonstrate improved health management independence as evidenced by checking blood pressure as directed and notifying PCP if SBP>160 or DBP > 90, taking all medications as prescribe, and adhering to a low sodium diet as discussed. Interventions:   Evaluation of current treatment plan related to hypertension self management and patient's adherence to plan as established by provider.  Provided education to patient re: stroke prevention, s/s of  heart attack and stroke, DASH diet, complications of uncontrolled blood pressure  Reviewed medications with patient and discussed importance of compliance. 04-23-2020: endorses compliance   Discussed plans with patient for ongoing care management follow up and provided patient with direct contact information for care management team  Advised patient, providing education and rationale, to monitor blood pressure daily and record, calling PCP for findings outside established parameters. The patient states her blood pressures are stable   Reviewed scheduled/upcoming provider appointments including:  05-23-2020  Patient Goals/Self-Care Activities  Over the next 120 days, patient will:  - Calls provider office for new concerns, questions, or BP outside discussed parameters Checks BP and records as discussed Follows a low sodium diet/DASH diet - blood pressure trends reviewed - depression screen reviewed - home or ambulatory blood pressure monitoring encouraged Follow Up Plan: Telephone follow up appointment with care management team member scheduled for: 04-23-2020 at 1 pm   Care Plan : RNCM: HLD Management  Updates made by Vanita Ingles since 04/23/2020 12:00 AM    Problem: HLD: Health Promotion or Disease Self-Management (General Plan of Care)   Priority: Medium    Long-Range Goal: RNCM: HLD: Self-Management Plan Developed   Priority: Medium  Note:   Current Barriers:   Poorly controlled hyperlipidemia, complicated by history of stroke in 2020, HTN  Current antihyperlipidemic regimen: Lipitor 40 mg daily  Most recent lipid panel:   Lab Results   Component  Value  Date     CHOL  141  04/11/2020     HDL  72  04/11/2020     [place]  58  04/11/2020     TRIG  52  04/11/2020     CHOLHDL  2.8  04/18/2018      ASCVD risk enhancing conditions:  History of Stroke, HTN, Asthma  Unable to independently manage HLD  Does not contact provider office for questions/concerns  RN Care Manager Clinical Goal(s):   Over the next 120 days, patient will work with Consulting civil engineer, providers, and care team towards execution of optimized self-health management plan  Over the next 120 days, patient will verbalize understanding of plan for management of HLD  Over the next 120 days, patient will attend all scheduled medical appointments: 05-23-2020  Interventions:  Medication review performed; medication list updated in electronic medical record.   Inter-disciplinary care team collaboration (see longitudinal plan of care)  Referred to pharmacy team for assistance  with HLD medication management  Evaluation of current treatment plan related to HLD and patient's adherence to plan as established by provider.  Advised patient to call the office for any changes in condition or questions  Provided education to patient re: monitoring dietary intake and following heart healthy diet  Reviewed medications with patient and discussed compliance. 04-23-2020: Review and the patient is compliant  Discussed plans with patient for ongoing care management follow up and provided patient with direct contact information for care management team  Reviewed scheduled/upcoming provider appointments including: 05-23-2020   Patient Goals/Self-Care Activities:  Over the next 120 days, patient will:   - call for medicine refill 2 or 3 days before it runs out - call if I am sick and can't take my medicine - keep a list of all the medicines I take; vitamins and herbals too - learn to read medicine labels - use a pillbox to sort medicine - use an alarm clock or phone to remind me to take my medicine - drink 6 to 8 glasses of  water each day - eat 3 to 5 servings of fruits and vegetables each day - fill half the plate with nonstarchy vegetables - limit fast food meals to no more than 1 per week - manage portion size - prepare main meal at home 3 to 5 days each week - read food labels for fat, fiber, carbohydrates and portion size - set goal weight - be open to making changes - I can manage, know and watch for signs of a heart attack - if I have chest pain, call for help - learn about small changes that will make a big difference - learn my personal risk factors - barriers to meeting goals identified - change-talk evoked - choices provided - collaboration with team encouraged - decision-making supported - health risks reviewed - problem-solving facilitated - questions answered - readiness for change evaluated - reassurance provided - resources needed to meet goals  identified - self-reflection promoted - self-reliance encouraged - verbalization of feelings encouraged   Follow Up Plan: Telephone follow up appointment with care management team member scheduled for: 06-25-2020 at 1 pm       Plan: Telephone follow up appointment with care management team member scheduled for:  06-25-2020 at 1 pm  Kristin Larsson RN, MSN, [facility] Mobile: [phone]

## 2020-04-23 NOTE — Patient Instructions (Signed)
Visit Information  Goals Addressed            This Visit's Progress   . RNCM: Learn More About My Health       Timeframe:  Short-Term Goal Priority:  Medium Start Date:                             Expected End Date:        04-23-2021                Follow Up Date 06-25-2020   - tell my story and reason for my visit - make a list of questions - ask questions - repeat what I heard to make sure I understand - bring a list of my medicines to the visit - speak up when I don't understand    Why is this important?    The best way to learn about your health and care is by talking to the doctor and nurse.   They will answer your questions and give you information in the way that you like best.          Patient Care Plan: RNCM: Asthma (Adult)    Problem Identified: RNCM: Disease Progression (Asthma)   Priority: Medium    Goal: RNCM: COPD/Asthma Disease Progression Prevented or Minimized   Priority: Medium  Note:   Current Barriers:  [name] Knowledge deficits related to basic understanding of COPD disease process . Knowledge deficits related to basic COPD self care/management . Knowledge deficit related to importance of energy conservation . Limited Social Support . Unable to independently manage Asthma during exacerbation periods  . Lacks social connections . Does not contact provider office for questions/concerns  Case Manager Clinical Goal(s):  Over the next 120 days patient will report using inhalers as prescribed including rinsing mouth after use  Over the next 120 days patient will report utilizing pursed lip breathing for shortness of breath  Over the next 120 days, patient will be able to verbalize understanding of COPD action plan and when to seek appropriate levels of medical care  Over the next 120 days, patient will engage in lite exercise as tolerated to build/regain stamina and strength and reduce shortness of breath through activity tolerance  Over the next 120  days, patient will verbalize basic understanding of COPD disease process and self care activities  Over the next 120 days, patient will not be hospitalized for COPD exacerbation  Interventions:   Provided patient with basic written and verbal COPD education on self care/management/and exacerbation prevention   Provided patient with COPD action plan and reinforced importance of daily self assessment  Provided instruction about proper use of medications used for management of COPD including inhalers  Advised patient to self assesses COPD action plan zone and make appointment with provider if in the yellow zone for 48 hours without improvement.  Provided patient with education about the role of exercise in the management of COPD. 04-23-2020: The patient is working with OT for strengthening and is happy because this is helping her.   Advised patient to engage in light exercise as tolerated 3-5 days a week  Provided education about and advised patient to utilize infection prevention strategies to reduce risk of respiratory infection  Patient Goals/Self-Care Activities:  . - activity or exercise based on tolerance encouraged . - barriers to medication adherence identified . - emotional support provided . - medication-adherence assessment completed . - participation in asthma  disease management program encouraged . - screen for functional limitations reviewed . - self-awareness of symptom triggers encouraged . - symptom control monitored . - symptom log or asthma action plan reviewed . - symptom triggers identified Follow Up Plan: Telephone follow up appointment with care management team member scheduled for:  06-25-2020 at 1 pm   Task: RNCM: Monitor and Manage Adherence to Asthma Therapy   Note:   Care Management Activities:    - activity or exercise based on tolerance encouraged - barriers to medication adherence identified - emotional support provided - medication-adherence assessment  completed - participation in asthma disease management program encouraged - screen for functional limitations reviewed - self-awareness of symptom triggers encouraged - symptom control monitored - symptom log or asthma action plan reviewed - symptom triggers identified       Patient Care Plan: RNCM: Management of Stroke (Adult)    Problem Identified: RNCM: Emotional Adjustment to Disease (Stroke)   Priority: Medium    Goal: RNCM: Management of Stroke   Priority: Medium  Note:   Current Barriers:  [name] Knowledge Deficits related to OT therapy restarting for patient with past history of a stroke . Care Coordination needs related to OT in a patient with history of a stroke  . Chronic Disease Management support and education needs related to deficits from past stroke  . Lacks caregiver support.  . Unable to independently manage post stroke care  . Does not contact provider office for questions/concerns  Nurse Case Manager Clinical Goal(s):  [name] Over the next 120 days, patient will verbalize understanding of plan for stroke prevention . Over the next 120 days, patient will work with RNCM, pcp and CCM team to address needs related to residual deficits related to past stroke . Over the next 120 days, patient will attend all scheduled medical appointments: 05-23-2020 . Over the next 120 days, patient will demonstrate improved adherence to prescribed treatment plan for stroke prevention as evidenced bycalling office for changes, compliance with medications and heart healthy diet and following the recommendations of the care team.   Interventions:  . 1:1 collaboration with [name], NP regarding development and update of comprehensive plan of care as evidenced by provider attestation and co-signature . Inter-disciplinary care team collaboration (see longitudinal plan of care) . Evaluation of current treatment plan related to history of stroke  and patient's adherence to plan as established  by provider. . Advised patient to call the office if she has not heard back from OT- will discuss referral with the pcp. 04-23-2020: The patient is working with OT and says this is helping her a lot.  . Provided education to patient re: monitor for changes in condition and to call the provider for new concerns or questions.  . Reviewed medications with patient and discussed compliance . Collaborated with pcp regarding the patient needing a new referral for OT because they called her in November for the referral last put in and she needs a new referral for the new year.  She would like to work with Any Tomasita Morrow as she did before. 04-23-2020: The patient is working with Amy and is happy to be back with her . Discussed plans with patient for ongoing care management follow up and provided patient with direct contact information for care management team . Reviewed scheduled/upcoming provider appointments including: 05-23-2020  Patient Goals/Self-Care Activities Over the next 120 days, patient will:  - Patient will self administer medications as prescribed Patient will attend all scheduled provider  appointments Patient will call pharmacy for medication refills Patient will call provider office for new concerns or questions Patient will work with BSW to address care coordination needs and will continue to work with the clinical team to address health care and disease management related needs.    - decision-making supported - depression screen reviewed - positive reinforcement provided - problem-solving facilitated - relaxation techniques promoted - self-care encouraged - self-reflection promoted - verbalization of feelings encouraged Follow Up Plan: Telephone follow up appointment with care management team member scheduled for: 06-25-2020 at 1 pm       Task: RNCM: Support Psychosocial Response to Stroke   Note:   Care Management Activities:    - decision-making supported - depression screen  reviewed - positive reinforcement provided - problem-solving facilitated - relaxation techniques promoted - self-care encouraged - self-reflection promoted - verbalization of feelings encouraged       Patient Care Plan: RNCM: Hypertension (Adult)    Problem Identified: RNCM: Hypertension (Hypertension)   Priority: Medium    Goal: RNCM: Hypertension Monitored   Priority: Medium  Note:   Objective:  . Last practice recorded BP readings:  . BP Readings from Last 3 Encounters: .  04/11/20 . 138/80 .  11/27/19 . 140/88 .  10/09/19 . 122/74 .    [name] Most recent eGFR/CrCl: No results found for: EGFR  No components found for: CRCL Current Barriers:  [name] Knowledge Deficits related to basic understanding of hypertension pathophysiology and self care management . Knowledge Deficits related to understanding of medications prescribed for management of hypertension . Limited Social Support . Unable to independently manage HTN . Does not contact provider office for questions/concerns Case Manager Clinical Goal(s):  [name] Over the next 120 days, patient will verbalize understanding of plan for hypertension management . Over the next 120 days, patient will attend all scheduled medical appointments: 05-23-2020 . Over the next 120 days, patient will demonstrate improved adherence to prescribed treatment plan for hypertension as evidenced by taking all medications as prescribed, monitoring and recording blood pressure as directed, adhering to low sodium/DASH diet . Over the next 120 days, patient will demonstrate improved health management independence as evidenced by checking blood pressure as directed and notifying PCP if SBP>160 or DBP > 90, taking all medications as prescribe, and adhering to a low sodium diet as discussed. Interventions:  . Evaluation of current treatment plan related to hypertension self management and patient's adherence to plan as established by provider. . Provided education to  patient re: stroke prevention, s/s of heart attack and stroke, DASH diet, complications of uncontrolled blood pressure . Reviewed medications with patient and discussed importance of compliance. 04-23-2020: endorses compliance  . Discussed plans with patient for ongoing care management follow up and provided patient with direct contact information for care management team . Advised patient, providing education and rationale, to monitor blood pressure daily and record, calling PCP for findings outside established parameters. The patient states her blood pressures are stable  . Reviewed scheduled/upcoming provider appointments including: 05-23-2020  Patient Goals/Self-Care Activities . Over the next 120 days, patient will:  - Calls provider office for new concerns, questions, or BP outside discussed parameters Checks BP and records as discussed Follows a low sodium diet/DASH diet - blood pressure trends reviewed - depression screen reviewed - home or ambulatory blood pressure monitoring encouraged Follow Up Plan: Telephone follow up appointment with care management team member scheduled for: 04-23-2020 at 1 pm   Task: RNCM: Identify and Monitor Blood  Pressure Elevation   Note:   Care Management Activities:    - blood pressure trends reviewed - depression screen reviewed - home or ambulatory blood pressure monitoring encouraged       Patient Care Plan: RNCM: HLD Management    Problem Identified: HLD: Health Promotion or Disease Self-Management (General Plan of Care)   Priority: Medium    Long-Range Goal: RNCM: HLD: Self-Management Plan Developed   Priority: Medium  Note:   Current Barriers:  . Poorly controlled hyperlipidemia, complicated by history of stroke in 2020, HTN . Current antihyperlipidemic regimen: Lipitor 40 mg daily . Most recent lipid panel:  . Lab Results .  Component . Value . Date .   [name] CHOL . 141 . 04/11/2020 .   [name] HDL . 72 . 04/11/2020 .   [name] [place] . 58  . 04/11/2020 .   [name] TRIG . 52 . 04/11/2020 .   [name] CHOLHDL . 2.8 . 04/18/2018 .    [name] ASCVD risk enhancing conditions:  History of Stroke, HTN, Asthma . Unable to independently manage HLD . Does not contact provider office for questions/concerns  RN Care Manager Clinical Goal(s):  [name] Over the next 120 days, patient will work with Consulting civil engineer, providers, and care team towards execution of optimized self-health management plan . Over the next 120 days, patient will verbalize understanding of plan for management of HLD . Over the next 120 days, patient will attend all scheduled medical appointments: 05-23-2020  Interventions: [name] Medication review performed; medication list updated in electronic medical record.  Bertram Savin care team collaboration (see longitudinal plan of care) . Referred to pharmacy team for assistance with HLD medication management . Evaluation of current treatment plan related to HLD and patient's adherence to plan as established by provider. . Advised patient to call the office for any changes in condition or questions . Provided education to patient re: monitoring dietary intake and following heart healthy diet . Reviewed medications with patient and discussed compliance. 04-23-2020: Review and the patient is compliant . Discussed plans with patient for ongoing care management follow up and provided patient with direct contact information for care management team . Reviewed scheduled/upcoming provider appointments including: 05-23-2020   Patient Goals/Self-Care Activities: . Over the next 120 days, patient will:   - call for medicine refill 2 or 3 days before it runs out - call if I am sick and can't take my medicine - keep a list of all the medicines I take; vitamins and herbals too - learn to read medicine labels - use a pillbox to sort medicine - use an alarm clock or phone to remind me to take my medicine - drink 6 to 8 glasses of water each day - eat 3 to 5  servings of fruits and vegetables each day - fill half the plate with nonstarchy vegetables - limit fast food meals to no more than 1 per week - manage portion size - prepare main meal at home 3 to 5 days each week - read food labels for fat, fiber, carbohydrates and portion size - set goal weight - be open to making changes - I can manage, know and watch for signs of a heart attack - if I have chest pain, call for help - learn about small changes that will make a big difference - learn my personal risk factors - barriers to meeting goals identified - change-talk evoked - choices provided - collaboration with team encouraged - decision-making supported - health  risks reviewed - problem-solving facilitated - questions answered - readiness for change evaluated - reassurance provided - resources needed to meet goals identified - self-reflection promoted - self-reliance encouraged - verbalization of feelings encouraged   Follow Up Plan: Telephone follow up appointment with care management team member scheduled for: 06-25-2020 at 1 pm     Task: RNCM: HLD-Mutually Develop and Royce Macadamia Achievement of Patient Goals   Note:   Care Management Activities:    - barriers to meeting goals identified - change-talk evoked - choices provided - collaboration with team encouraged - decision-making supported - health risks reviewed - problem-solving facilitated - questions answered - readiness for change evaluated - reassurance provided - resources needed to meet goals identified - self-reflection promoted - self-reliance encouraged - verbalization of feelings encouraged        Patient verbalizes understanding of instructions provided today and agrees to view in [place].   Telephone follow up appointment with care management team member scheduled for:06-25-2020 at 1 pm  Noreene Larsson RN, MSN, [facility] Mobile: [phone]

## 2020-04-25 ENCOUNTER — Encounter: Payer: Self-pay | Admitting: Occupational Therapy

## 2020-04-25 ENCOUNTER — Other Ambulatory Visit: Payer: Self-pay

## 2020-04-25 ENCOUNTER — Ambulatory Visit: Payer: Medicaid Other | Attending: Nurse Practitioner | Admitting: Occupational Therapy

## 2020-04-25 DIAGNOSIS — R278 Other lack of coordination: Secondary | ICD-10-CM | POA: Diagnosis present

## 2020-04-25 DIAGNOSIS — I69351 Hemiplegia and hemiparesis following cerebral infarction affecting right dominant side: Secondary | ICD-10-CM | POA: Insufficient documentation

## 2020-04-25 DIAGNOSIS — M6281 Muscle weakness (generalized): Secondary | ICD-10-CM | POA: Insufficient documentation

## 2020-04-26 NOTE — Therapy (Signed)
[facility] MAIN [facility] SERVICES [address] [place], [place], [address] Phone: [phone]   Fax:  [phone]  Occupational Therapy Evaluation  Patient Details  Name: Kristin Solis MRN: [id number] Date of Birth: January 25, 1956 Referring Provider (OT): Aura Dials   Encounter Date: 04/25/2020   OT End of Session - 04/25/20 1004    Visit Number 1    Number of Visits 16    Date for OT Re-Evaluation 06/21/20    Authorization Type Medicaid    OT Start Time 1000    OT Stop Time 1058    OT Time Calculation (min) 58 min    Activity Tolerance Patient tolerated treatment well    Behavior During Therapy [facility] for tasks assessed/performed           Past Medical History:  Diagnosis Date  . Asthma   . Hypertension   . Stroke [facility])     Past Surgical History:  Procedure Laterality Date  . ABDOMINAL HYSTERECTOMY  2005   total  . IR CT HEAD LTD  04/18/2018  . IR INTRAVSC STENT CERV CAROTID W/O EMB-PROT MOD SED INC ANGIO  04/18/2018  . IR PERCUTANEOUS ART THROMBECTOMY/INFUSION INTRACRANIAL INC DIAG ANGIO  04/18/2018  . OOPHORECTOMY    . RADIOLOGY WITH ANESTHESIA N/A 04/17/2018   Procedure: RADIOLOGY WITH ANESTHESIA;  Surgeon: Julieanne Cotton, MD;  Location: [facility] OR;  Service: Radiology;  Laterality: N/A;    There were no vitals filed for this visit.   Subjective Assessment - 04/25/20 1002    Subjective  Pt reports she is still having difficulty with the right arm with raising and controlling it, some pain at the shoulder joint, 5/10    Pertinent History Patient is a 65 y.o. female with history of stroke,  admitted to ED on 04/17/18 with dysphagia and right sided weakness s/p left MCA CVA, extubated on 04/19/18 and discharged on 04/23/18 to private residence    Patient Stated Goals Be able to move my arm up and down and use it like normal, get rid of the pain.    Currently in Pain? No/denies    Pain Score 0-No pain              [facility] OT Assessment -  04/25/20 1006      Assessment   Medical Diagnosis CVA    Referring Provider (OT) Aura Dials    Onset Date/Surgical Date 04/17/18    Hand Dominance Right      Balance Screen   Has the patient fallen in the past 6 months No      Home  Environment   Family/patient expects to be discharged to: Private residence    Living Arrangements Spouse/significant other    Available Help at Discharge Family    Type of Home House    Home Access Stairs    Home Layout Two level    Alternate Level Stairs - Number of Steps 12    Bathroom Shower/Tub Tub/Shower unit    Home Equipment Walker - 4 wheels;Cane - single point;Bedside commode;Shower seat;Tub bench    Additional Comments uses rollator when she goes out of the house but not at home.      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  She does not do laundry, husband performs this task but she has tried to help to fold laundry, difficult to perform in standing, limited range of motion when managing larger items to fold such as large towels, sheets, blankets      IADL   Prior Level of Function Shopping independent    Shopping Assistance for transportation    Prior Level of Function Light Housekeeping independent    Light Housekeeping Performs light daily tasks such  as dishwashing, bed making    Prior Level of Function Meal Prep independent    Meal Prep Able to complete simple warm meal prep    Prior Level of Function Community Mobility independent    Community Mobility Relies on family or friends for transportation    Prior Level of Function Medication Managment independent    Medication Management Takes responsibility if medication is prepared in advance in seperate dosage;Has difficulty remembering to take medication    Prior Level of Function Financial Management independent    Financial Management --   husband always has performed     Mobility   Mobility Status Independent    Mobility Status Comments occasional use of the cane      Written Expression   Dominant Hand Right    Handwriting 75% legible      Vision - History   Baseline Vision Wears glasses all the time      Cognition   Overall Cognitive Status Impaired/Different from baseline    Memory Impaired    Memory Impairment Decreased recall of new information    Problem Solving Appears intact    Cognition Comments sometimes has difficulty recalling names at times.  She does require reminders about taking medications.      Observation/Other Assessments   Focus on Therapeutic Outcomes (FOTO)  60      Sensation   Light Touch Appears Intact    Stereognosis Appears Intact    Hot/Cold Appears Intact    Proprioception Appears Intact      Coordination   Gross Motor Movements are Fluid and Coordinated No    Fine Motor Movements are Fluid and Coordinated No    9 Hole Peg Test Right;Left    Right 9 Hole Peg Test 35    Left 9 Hole Peg Test 31    Coordination able to oppose thumb to small finger, some pulling noted in forearm      ROM / Strength   AROM / PROM / Strength AROM;Strength      AROM   Right Shoulder Flexion 116 Degrees    Right Shoulder ABduction 113 Degrees    Right Shoulder External Rotation --   pain with ER, 5/10   Right Wrist Extension 55 Degrees    Right Wrist  Flexion 74 Degrees    Right Wrist Radial Deviation 21 Degrees    Right Wrist Ulnar Deviation 30 Degrees    Left Wrist Extension 64 Degrees  Left Wrist Flexion 75 Degrees      Strength   Overall Strength Comments R 3-/5, Left 4/5      Hand Function   Right Hand Grip (lbs) 40    Right Hand Lateral Pinch 13 lbs    Right Hand 3 Point Pinch 13 lbs    Left Hand Grip (lbs) 57    Left Hand Lateral Pinch 15 lbs    Left 3 point pinch 15 lbs    Comment 2 point pinch R 9#, L 11                            OT Education - 04/25/20 1004    Education Details goals, POC, outcomes    Person(s) Educated Patient    Methods Explanation;Demonstration    Comprehension Verbalized understanding;Returned demonstration               OT Long Term Goals - 04/26/20 0955      OT LONG TERM GOAL #1   Title Patient will complete home exercise program with modified independence.    Baseline has some exercises but will need updates to current HEP    Time 8    Period Weeks    Status New    Target Date 06/21/20      OT LONG TERM GOAL #2   Title Pt will demonstrate improvement in FOTO score to 64 or greater to show a clinically relevant change to improve right hand function in daily tasks.    Baseline Score of 60 at eval    Time 8    Period Weeks    Status New    Target Date 06/21/20      OT LONG TERM GOAL #3   Title Pt will improve grip by 5# to assist with opening jars and containers    Baseline still has difficulty with managing containers, husband assists    Time 4    Period Weeks    Status New    Target Date 05/24/20      OT LONG TERM GOAL #4   Title Pt will decrease shoulder pain on the right to a 2 or less with reaching tasks involving shoulder flexion to place and remove items from a closet shelf.    Baseline pain increased to 6/10 at times with use of RUE, no pain at rest    Time 8    Period Weeks    Status New    Target Date 06/21/20      OT LONG TERM GOAL #5    Title Pt will improve R shoulder flexion by 10 degrees to use right UE during folding laundry, especially with larger items such as a sheet.    Baseline shoulder flexion 116    Time 8    Period Weeks    Status New    Target Date 06/21/20      Long Term Additional Goals   Additional Long Term Goals Yes      OT LONG TERM GOAL #6   Title Pt will improve ER to be able to reach and put on seat belt with pain less than 2/10.    Baseline pain with ER at eval    Time 4    Period Weeks    Status New    Target Date 05/24/20      OT LONG TERM GOAL #7   Title Pt to be able to vacuum 1-2 rooms with light  weight vacuum and no rest breaks.    Baseline can only do 1 room at a time and requires rest breaks    Time 8    Period Weeks    Status New    Target Date 06/21/20                 Plan - 04/26/20 0981    Clinical Impression Statement Patient is a 65 y.o. female with history of stroke, admitted to ED on 04/17/18 with dysphagia and right sided weakness s/p left MCA CVA, extubated on 04/19/18 and discharged on 04/23/18 to private residence.  She has been seen in the past for therapy and made excellent progress however has limitations with insurance visits and has continued to work at home following a home program.  She recently has increased pain in the right shoulder with use and reports decreased ROM at times and feels she sometimes has to use the left hand to help pick up the right arm to move it.  She demonstrates pain in right shoulder, decreased ROM, decreased strength, slightly decreased coordination with 9 hole peg test when compared historically and decreased ability to perform higher level IADL tasks.  She would benefit from skilled OT services to maximize safety and independence in necessary daily tasks at home and in the community.    OT Occupational Profile and History Detailed Assessment- Review of Records and additional review of physical, cognitive, psychosocial history related to  current functional performance    Occupational performance deficits (Please refer to evaluation for details): ADL's;IADL's;Work;Leisure    Body Structure / Function / Physical Skills ADL;Coordination;GMC;UE functional use;Balance;Decreased knowledge of use of DME;Flexibility;IADL;Dexterity;FMC;Proprioception;Strength;Edema;ROM    Cognitive Skills Memory    Psychosocial Skills Environmental  Adaptations;Habits;Routines and Behaviors    Rehab Potential Good    Clinical Decision Making Limited treatment options, no task modification necessary    Comorbidities Affecting Occupational Performance: May have comorbidities impacting occupational performance    Modification or Assistance to Complete Evaluation  No modification of tasks or assist necessary to complete eval    OT Frequency 2x / week    OT Duration 8 weeks    OT Treatment/Interventions Self-care/ADL training;Cryotherapy;Therapeutic exercise;DME and/or AE instruction;Functional Mobility Training;Cognitive remediation/compensation;Balance training;Electrical Stimulation;Neuromuscular education;Manual Therapy;Splinting;Moist Heat;Contrast Bath;Therapeutic activities;Patient/family education;Passive range of motion    Consulted and Agree with Plan of Care Patient           Patient will benefit from skilled therapeutic intervention in order to improve the following deficits and impairments:   Body Structure / Function / Physical Skills: ADL,Coordination,GMC,UE functional use,Balance,Decreased knowledge of use of DME,Flexibility,IADL,Dexterity,FMC,Proprioception,Strength,Edema,ROM Cognitive Skills: Memory Psychosocial Skills: Environmental  Adaptations,Habits,Routines and Behaviors   Visit Diagnosis: Muscle weakness (generalized)  Other lack of coordination  Hemiplegia and hemiparesis following cerebral infarction affecting right dominant side [facility])    Problem List Patient Active Problem List   Diagnosis Date Noted  . Tinnitus of  both ears 04/11/2020  . Insomnia 04/11/2020  . Stress incontinence 10/09/2019  . Morbid obesity (HCC) 08/15/2019  . Mixed hyperlipidemia 09/28/2018  . History of stroke 04/17/2018  . IFG (impaired fasting glucose) 03/28/2018  . Essential hypertension, benign 01/15/2017  . Asthma 01/15/2017  . Obstructive sleep apnea 10/18/2013   Kerrie Buffalo, OTR/L, CLT  Camila Norville 04/26/2020, 10:14 AM   [facility] MAIN [facility] SERVICES [address] [place], [place], [address] Phone: [phone]   Fax:  [phone]  Name: Kristin Solis MRN: [id number] Date of Birth: [birth year]

## 2020-05-02 ENCOUNTER — Other Ambulatory Visit: Payer: Self-pay

## 2020-05-02 ENCOUNTER — Ambulatory Visit: Payer: Medicaid Other | Admitting: Occupational Therapy

## 2020-05-02 DIAGNOSIS — I69351 Hemiplegia and hemiparesis following cerebral infarction affecting right dominant side: Secondary | ICD-10-CM

## 2020-05-02 DIAGNOSIS — M6281 Muscle weakness (generalized): Secondary | ICD-10-CM

## 2020-05-02 DIAGNOSIS — R278 Other lack of coordination: Secondary | ICD-10-CM

## 2020-05-03 ENCOUNTER — Encounter: Payer: Self-pay | Admitting: Occupational Therapy

## 2020-05-03 NOTE — Therapy (Addendum)
[facility] MAIN [facility] SERVICES [address] [place], [place], [address] Phone: [phone]   Fax:  [phone]  Occupational Therapy Treatment  Patient Details  Name: Kristin Solis MRN: [id number] Date of Birth: 05-Mar-1955 Referring Provider (OT): Aura Dials   Encounter Date: 05/02/2020   OT End of Session - 05/05/20 1518    Visit Number 2    Number of Visits 16    Date for OT Re-Evaluation 06/21/20    Authorization Type Medicaid    OT Start Time 1015    OT Stop Time 1100    OT Time Calculation (min) 45 min    Activity Tolerance Patient tolerated treatment well    Behavior During Therapy [facility] for tasks assessed/performed           Past Medical History:  Diagnosis Date  . Asthma   . Hypertension   . Stroke [facility]     Past Surgical History:  Procedure Laterality Date  . ABDOMINAL HYSTERECTOMY  2005   total  . IR CT HEAD LTD  04/18/2018  . IR INTRAVSC STENT CERV CAROTID W/O EMB-PROT MOD SED INC ANGIO  04/18/2018  . IR PERCUTANEOUS ART THROMBECTOMY/INFUSION INTRACRANIAL INC DIAG ANGIO  04/18/2018  . OOPHORECTOMY    . RADIOLOGY WITH ANESTHESIA N/A 04/17/2018   Procedure: RADIOLOGY WITH ANESTHESIA;  Surgeon: Julieanne Cotton, MD;  Location: [facility] OR;  Service: Radiology;  Laterality: N/A;    There were no vitals filed for this visit.   Subjective Assessment - 05/05/20 1517    Subjective  Patient reports pain and "catching" in the shoulder happens occasionally but not all the time.  Has been working on crafts and projects at home.    Pertinent History Patient is a 65 y.o. female with history of stroke,  admitted to ED on 04/17/18 with dysphagia and right sided weakness s/p left MCA CVA, extubated on 04/19/18 and discharged on 04/23/18 to private residence    Patient Stated Goals Be able to move my arm up and down and use it like normal, get rid of the pain.    Currently in Pain? Yes    Pain Score 4     Pain Location Shoulder    Pain  Orientation Right    Pain Descriptors / Indicators Aching;Sharp    Pain Onset More than a month ago    Pain Frequency Intermittent          Manual therapy:  Mobs to right scapula for elevation, depression, upwards rotation to decrease pain and increase ROM of right UE performed prior to therapeutic exercise.    Therapeutic Exercise: Patient seen for exercises in supine, PROM shoulder flexion to 90 degrees without pain, up to 120 with mild pain 4/10 and occasional "catching" sensation with return from flexion.  AAROM for shoulder flexion to 90 degrees for place and hold, added external perturbations, small circles forwards and back, ABC exercise for one set.  Shoulder protraction in supine with cues and occasional guiding.  Reaching tasks with occasional guiding from therapist.  Elbow flexion with shoulder at 90 degrees of flexion followed by triceps press.  Supination/pronation, wrist flexion/extension and digits.  Mild edema this date, discussed use of contrast, positioning and retrograde massage to assist with decreasing edema.    Response to tx: Pt performing well with exercises, requires some guiding and assist from therapist in greater ROM planes.  Patient with pain at times when working towards end ranges of motion and with return from shoulder  flexion.  Mild edema this date and instructed on techniques to decrease edema at home.  Continue OT towards goals in plan of care to increase independence in necessary daily tasks.                           OT Education - 05/05/20 1518    Education Details strengthening, ROM    Person(s) Educated Patient    Methods Explanation;Demonstration    Comprehension Verbalized understanding;Returned demonstration               OT Long Term Goals - 04/26/20 0955      OT LONG TERM GOAL #1   Title Patient will complete home exercise program with modified independence.    Baseline has some exercises but will need updates to  current HEP    Time 8    Period Weeks    Status New    Target Date 06/21/20      OT LONG TERM GOAL #2   Title Pt will demonstrate improvement in FOTO score to 64 or greater to show a clinically relevant change to improve right hand function in daily tasks.    Baseline Score of 60 at eval    Time 8    Period Weeks    Status New    Target Date 06/21/20      OT LONG TERM GOAL #3   Title Pt will improve grip by 5# to assist with opening jars and containers    Baseline still has difficulty with managing containers, husband assists    Time 4    Period Weeks    Status New    Target Date 05/24/20      OT LONG TERM GOAL #4   Title Pt will decrease shoulder pain on the right to a 2 or less with reaching tasks involving shoulder flexion to place and remove items from a closet shelf.    Baseline pain increased to 6/10 at times with use of RUE, no pain at rest    Time 8    Period Weeks    Status New    Target Date 06/21/20      OT LONG TERM GOAL #5   Title Pt will improve R shoulder flexion by 10 degrees to use right UE during folding laundry, especially with larger items such as a sheet.    Baseline shoulder flexion 116    Time 8    Period Weeks    Status New    Target Date 06/21/20      Long Term Additional Goals   Additional Long Term Goals Yes      OT LONG TERM GOAL #6   Title Pt will improve ER to be able to reach and put on seat belt with pain less than 2/10.    Baseline pain with ER at eval    Time 4    Period Weeks    Status New    Target Date 05/24/20      OT LONG TERM GOAL #7   Title Pt to be able to vacuum 1-2 rooms with light weight vacuum and no rest breaks.    Baseline can only do 1 room at a time and requires rest breaks    Time 8    Period Weeks    Status New    Target Date 06/21/20                 Plan -  05/05/20 1518    Clinical Impression Statement Pt performing well with exercises, requires some guiding and assist from therapist in greater ROM  planes.  Patient with pain at times when working towards end ranges of motion and with return from shoulder flexion.  Mild edema this date and instructed on techniques to decrease edema at home.  Continue OT towards goals in plan of care to increase independence in necessary daily tasks.    OT Occupational Profile and History Detailed Assessment- Review of Records and additional review of physical, cognitive, psychosocial history related to current functional performance    Occupational performance deficits (Please refer to evaluation for details): ADL's;IADL's;Work;Leisure    Body Structure / Function / Physical Skills ADL;Coordination;GMC;UE functional use;Balance;Decreased knowledge of use of DME;Flexibility;IADL;Dexterity;FMC;Proprioception;Strength;Edema;ROM    Cognitive Skills Memory    Psychosocial Skills Environmental  Adaptations;Habits;Routines and Behaviors    Rehab Potential Good    Clinical Decision Making Limited treatment options, no task modification necessary    Comorbidities Affecting Occupational Performance: May have comorbidities impacting occupational performance    Modification or Assistance to Complete Evaluation  No modification of tasks or assist necessary to complete eval    OT Frequency 2x / week    OT Duration 8 weeks    OT Treatment/Interventions Self-care/ADL training;Cryotherapy;Therapeutic exercise;DME and/or AE instruction;Functional Mobility Training;Cognitive remediation/compensation;Balance training;Electrical Stimulation;Neuromuscular education;Manual Therapy;Splinting;Moist Heat;Contrast Bath;Therapeutic activities;Patient/family education;Passive range of motion    Consulted and Agree with Plan of Care Patient           Patient will benefit from skilled therapeutic intervention in order to improve the following deficits and impairments:   Body Structure / Function / Physical Skills: ADL,Coordination,GMC,UE functional use,Balance,Decreased knowledge of use  of DME,Flexibility,IADL,Dexterity,FMC,Proprioception,Strength,Edema,ROM Cognitive Skills: Memory Psychosocial Skills: Environmental  Adaptations,Habits,Routines and Behaviors   Visit Diagnosis: Muscle weakness (generalized)  Other lack of coordination  Hemiplegia and hemiparesis following cerebral infarction affecting right dominant side [facility])    Problem List Patient Active Problem List   Diagnosis Date Noted  . Tinnitus of both ears 04/11/2020  . Insomnia 04/11/2020  . Stress incontinence 10/09/2019  . Morbid obesity (HCC) 08/15/2019  . Mixed hyperlipidemia 09/28/2018  . History of stroke 04/17/2018  . IFG (impaired fasting glucose) 03/28/2018  . Essential hypertension, benign 01/15/2017  . Asthma 01/15/2017  . Obstructive sleep apnea 10/18/2013   Kerrie Buffalo, OTR/L, CLT  , 05/05/2020, 3:29 PM  [place] [facility] MAIN [facility] SERVICES [address] [place], [place], [address] Phone: [phone]   Fax:  [phone]  Name: Kristin Solis MRN: [id number] Date of Birth: [birth year]

## 2020-05-06 ENCOUNTER — Ambulatory Visit: Payer: Medicaid Other | Admitting: Occupational Therapy

## 2020-05-06 ENCOUNTER — Encounter: Payer: Self-pay | Admitting: Occupational Therapy

## 2020-05-06 ENCOUNTER — Other Ambulatory Visit: Payer: Self-pay

## 2020-05-06 DIAGNOSIS — M6281 Muscle weakness (generalized): Secondary | ICD-10-CM

## 2020-05-06 DIAGNOSIS — R278 Other lack of coordination: Secondary | ICD-10-CM

## 2020-05-06 DIAGNOSIS — I69351 Hemiplegia and hemiparesis following cerebral infarction affecting right dominant side: Secondary | ICD-10-CM

## 2020-05-06 NOTE — Therapy (Signed)
[place] [facility] MAIN [facility] SERVICES [address] [place], [place], [address] Phone: [phone]   Fax:  [phone]  Occupational Therapy Treatment  Patient Details  Name: Kristin Solis MRN: [id number] Date of Birth: 1955-03-24 Referring Provider (OT): Aura Dials   Encounter Date: 05/06/2020   OT End of Session - 05/09/20 1155    Visit Number 3    Number of Visits 16    Date for OT Re-Evaluation 06/21/20    Authorization Type Medicaid    OT Start Time 1300    OT Stop Time 1350    OT Time Calculation (min) 50 min    Activity Tolerance Patient tolerated treatment well    Behavior During Therapy [facility] for tasks assessed/performed           Past Medical History:  Diagnosis Date  . Asthma   . Hypertension   . Stroke [facility])     Past Surgical History:  Procedure Laterality Date  . ABDOMINAL HYSTERECTOMY  2005   total  . IR CT HEAD LTD  04/18/2018  . IR INTRAVSC STENT CERV CAROTID W/O EMB-PROT MOD SED INC ANGIO  04/18/2018  . IR PERCUTANEOUS ART THROMBECTOMY/INFUSION INTRACRANIAL INC DIAG ANGIO  04/18/2018  . OOPHORECTOMY    . RADIOLOGY WITH ANESTHESIA N/A 04/17/2018   Procedure: RADIOLOGY WITH ANESTHESIA;  Surgeon: Julieanne Cotton, MD;  Location: [facility] OR;  Service: Radiology;  Laterality: N/A;    There were no vitals filed for this visit.   Subjective Assessment - 05/08/20 1154    Subjective  Pt reports the last couple days increased pain in the right shoulder, was getting up from the bed and the arm "caught", also felt the same pain again when just looking over her right shoulder in the truck.    Pertinent History Patient is a 65 y.o. female with history of stroke,  admitted to ED on 04/17/18 with dysphagia and right sided weakness s/p left MCA CVA, extubated on 04/19/18 and discharged on 04/23/18 to private residence    Patient Stated Goals Be able to move my arm up and down and use it like normal, get rid of the pain.    Currently in Pain? Yes     Pain Score 8     Pain Location Shoulder    Pain Orientation Right    Pain Descriptors / Indicators Sharp;Shooting    Pain Type Chronic pain    Pain Onset More than a month ago    Pain Frequency Intermittent           Pt with increased shoulder pain on the right side, worse than normal and feeling like the arm is catching at times.  She reports she has been doing a lot of crafts with plastic canvas and has kept the right elbow bent for long periods of time.  Also at end of session she admits she was attempting to move furniture around in her room over the weekend and it felt worse afterwards.    Manual therapy: Mobilization to right scapula for elevation, depression, and upward rotation performed by therapist prior to therapeutic exercise to increase range of motion and decrease pain. Therapeutic exercise: Patient seen for passive range of motion of right upper extremity, limited to 90 degrees of shoulder flexion and with increased pain this date, 8 out of 10 pain.  Patient able to tolerate passive range of motion if kept in lower ranges to not increase pain.  Patient participating in some active assistive range  of motion however difficult this date due to increased pain.  Additional focus with active range of motion for elbow flexion extension, supination pronation, wrist flexion and extension and digit flexion and extension.  Patient appears tender over her bicep tendon.  She may have aggravated the bicep tendon while performing crafts for long periods of time where the bicep is constantly engaged without taking rest breaks.  She also may have increased the pain when attempting to help move furniture in her room over the weekend.  Recommended patient stop crafts for a few days to see if the pain decreases and allow the arm to rest, and use of ice massage to bicep tendon.  Response to treatment: Patient very limited in her ability to participate actively with exercises this date due to  increased pain in right upper extremity and tenderness with palpation at the bicep tendon.  Patient demonstrates understanding of modifying her activities, temporarily stopping her craft activity for the next few days, rest and use of ice massage to right bicep tendon.  Will reassess next session and see if any of these recommendations help to bring down pain in her right upper extremity.  Continue OT towards goals and plan of care to maximize safety and independence in necessary daily tasks.                     OT Education - 05/09/20 1155    Education Details strengthening, ROM    Person(s) Educated Patient    Methods Explanation;Demonstration    Comprehension Verbalized understanding;Returned demonstration               OT Long Term Goals - 04/26/20 0955      OT LONG TERM GOAL #1   Title Patient will complete home exercise program with modified independence.    Baseline has some exercises but will need updates to current HEP    Time 8    Period Weeks    Status New    Target Date 06/21/20      OT LONG TERM GOAL #2   Title Pt will demonstrate improvement in FOTO score to 64 or greater to show a clinically relevant change to improve right hand function in daily tasks.    Baseline Score of 60 at eval    Time 8    Period Weeks    Status New    Target Date 06/21/20      OT LONG TERM GOAL #3   Title Pt will improve grip by 5# to assist with opening jars and containers    Baseline still has difficulty with managing containers, husband assists    Time 4    Period Weeks    Status New    Target Date 05/24/20      OT LONG TERM GOAL #4   Title Pt will decrease shoulder pain on the right to a 2 or less with reaching tasks involving shoulder flexion to place and remove items from a closet shelf.    Baseline pain increased to 6/10 at times with use of RUE, no pain at rest    Time 8    Period Weeks    Status New    Target Date 06/21/20      OT LONG TERM GOAL #5    Title Pt will improve R shoulder flexion by 10 degrees to use right UE during folding laundry, especially with larger items such as a sheet.    Baseline shoulder flexion 116  Time 8    Period Weeks    Status New    Target Date 06/21/20      Long Term Additional Goals   Additional Long Term Goals Yes      OT LONG TERM GOAL #6   Title Pt will improve ER to be able to reach and put on seat belt with pain less than 2/10.    Baseline pain with ER at eval    Time 4    Period Weeks    Status New    Target Date 05/24/20      OT LONG TERM GOAL #7   Title Pt to be able to vacuum 1-2 rooms with light weight vacuum and no rest breaks.    Baseline can only do 1 room at a time and requires rest breaks    Time 8    Period Weeks    Status New    Target Date 06/21/20                 Plan - 05/08/20 1155    Clinical Impression Statement Patient very limited in her ability to participate actively with exercises this date due to increased pain in right upper extremity and tenderness with palpation at the bicep tendon.  Patient demonstrates understanding of modifying her activities, temporarily stopping her craft activity for the next few days, rest and use of ice massage to right bicep tendon.  Will reassess next session and see if any of these recommendations help to bring down pain in her right upper extremity.  Continue OT towards goals and plan of care to maximize safety and independence in necessary daily tasks.    OT Occupational Profile and History Detailed Assessment- Review of Records and additional review of physical, cognitive, psychosocial history related to current functional performance    Occupational performance deficits (Please refer to evaluation for details): ADL's;IADL's;Work;Leisure    Body Structure / Function / Physical Skills ADL;Coordination;GMC;UE functional use;Balance;Decreased knowledge of use of DME;Flexibility;IADL;Dexterity;FMC;Proprioception;Strength;Edema;ROM     Cognitive Skills Memory    Psychosocial Skills Environmental  Adaptations;Habits;Routines and Behaviors    Rehab Potential Good    Clinical Decision Making Limited treatment options, no task modification necessary    Comorbidities Affecting Occupational Performance: May have comorbidities impacting occupational performance    Modification or Assistance to Complete Evaluation  No modification of tasks or assist necessary to complete eval    OT Frequency 2x / week    OT Duration 8 weeks    OT Treatment/Interventions Self-care/ADL training;Cryotherapy;Therapeutic exercise;DME and/or AE instruction;Functional Mobility Training;Cognitive remediation/compensation;Balance training;Electrical Stimulation;Neuromuscular education;Manual Therapy;Splinting;Moist Heat;Contrast Bath;Therapeutic activities;Patient/family education;Passive range of motion    Consulted and Agree with Plan of Care Patient           Patient will benefit from skilled therapeutic intervention in order to improve the following deficits and impairments:   Body Structure / Function / Physical Skills: ADL,Coordination,GMC,UE functional use,Balance,Decreased knowledge of use of DME,Flexibility,IADL,Dexterity,FMC,Proprioception,Strength,Edema,ROM Cognitive Skills: Memory Psychosocial Skills: Environmental  Adaptations,Habits,Routines and Behaviors   Visit Diagnosis: Muscle weakness (generalized)  Other lack of coordination  Hemiplegia and hemiparesis following cerebral infarction affecting right dominant side [facility](HCC)    Problem List Patient Active Problem List   Diagnosis Date Noted  . Tinnitus of both ears 04/11/2020  . Insomnia 04/11/2020  . Stress incontinence 10/09/2019  . Morbid obesity (HCC) 08/15/2019  . Mixed hyperlipidemia 09/28/2018  . History of stroke 04/17/2018  . IFG (impaired fasting glucose) 03/28/2018  . Essential hypertension, benign 01/15/2017  .  Asthma 01/15/2017  . Obstructive sleep apnea  10/18/2013   Kerrie Buffalo, OTR/L, CLT  Vernie Piet 05/09/2020, 12:08 PM  [facility] MAIN [facility] SERVICES [address] [place], [place], [address] Phone: [phone]   Fax:  [phone]  Name: Kristin Solis MRN: [id number] Date of Birth: 02-06-1956

## 2020-05-17 ENCOUNTER — Other Ambulatory Visit: Payer: Self-pay

## 2020-05-17 ENCOUNTER — Ambulatory Visit: Payer: Medicaid Other | Attending: Nurse Practitioner | Admitting: Occupational Therapy

## 2020-05-17 ENCOUNTER — Encounter: Payer: Self-pay | Admitting: Occupational Therapy

## 2020-05-17 DIAGNOSIS — M6281 Muscle weakness (generalized): Secondary | ICD-10-CM | POA: Insufficient documentation

## 2020-05-17 DIAGNOSIS — R278 Other lack of coordination: Secondary | ICD-10-CM | POA: Diagnosis present

## 2020-05-17 DIAGNOSIS — R2681 Unsteadiness on feet: Secondary | ICD-10-CM | POA: Insufficient documentation

## 2020-05-17 DIAGNOSIS — I69351 Hemiplegia and hemiparesis following cerebral infarction affecting right dominant side: Secondary | ICD-10-CM | POA: Diagnosis present

## 2020-05-17 NOTE — Therapy (Signed)
[place] [facility] MAIN [facility] SERVICES [address] [place], [place], [address] Phone: [phone]   Fax:  [phone]  Occupational Therapy Treatment  Patient Details  Name: Kristin Solis MRN: [id number] Date of Birth: 1955-05-08 Referring Provider (OT): Aura Dials   Encounter Date: 05/17/2020   OT End of Session - 05/17/20 0840    Visit Number 4    Number of Visits 16    Date for OT Re-Evaluation 06/21/20    Authorization Type Medicaid    OT Start Time 0830    OT Stop Time 0920    OT Time Calculation (min) 50 min    Activity Tolerance Patient tolerated treatment well    Behavior During Therapy [facility] for tasks assessed/performed           Past Medical History:  Diagnosis Date  . Asthma   . Hypertension   . Stroke [facility])     Past Surgical History:  Procedure Laterality Date  . ABDOMINAL HYSTERECTOMY  2005   total  . IR CT HEAD LTD  04/18/2018  . IR INTRAVSC STENT CERV CAROTID W/O EMB-PROT MOD SED INC ANGIO  04/18/2018  . IR PERCUTANEOUS ART THROMBECTOMY/INFUSION INTRACRANIAL INC DIAG ANGIO  04/18/2018  . OOPHORECTOMY    . RADIOLOGY WITH ANESTHESIA N/A 04/17/2018   Procedure: RADIOLOGY WITH ANESTHESIA;  Surgeon: Julieanne Cotton, MD;  Location: [facility] OR;  Service: Radiology;  Laterality: N/A;    There were no vitals filed for this visit.   Subjective Assessment - 05/17/20 0838    Subjective  Pt reports her shoulder is feeling better, did some ice over the week, held off on doing crafts.  No pain at the start of session today    Pertinent History Patient is a 65 y.o. female with history of stroke,  admitted to ED on 04/17/18 with dysphagia and right sided weakness s/p left MCA CVA, extubated on 04/19/18 and discharged on 04/23/18 to private residence    Currently in Pain? No/denies    Pain Score 0-No pain           Therapeutic Exercise: Patient seen for strengthening and ROM with focus on Right UE with use of UBE  3 mins in standing,  forwards and backwards, alternating levels of resistance from 0 to 1.0 in standing with therapist in constant attendance to ensure grip and to adjust settings. ROM and reaching with use of 4 level graduated height Shape tower performed 1/2 in sitting and 1/2 in standing (for higher levels), able to complete 2 lowest heights in sitting, then standing for the remaining.  No pain with task but increased effort with reaching patterns greater than 90 degrees of shoulder flexion. Grip strength with use of hand gripper for sustained gripping patterns to place pegs with right hand and then remove with hand gripper to place into container.  Able to complete 3rd setting (17#)  for 2 sets occasional cues for hand placement on gripper.   Use of scissors with right hand to cut out intricate pictures, 2 sets completed.  One set of scissors with greater resistance and required increased focus and effort, the other set was heavier but easier to operate. 1# dowel for chest press, forwards/backward circles, ABD/ADD for 10 reps for 1-2 sets each, therapist demo and cues for proper form and technique.  Issued new Isotoner glove XS for right hand for edema control Pt able to demonstrate donning and doffing and will wear over the next week.   Discussed the  need for rest breaks and not to participate in craft activities for extended periods of time to decrease inflammation in right shoulder.   Response to tx: Last session patient complained of shoulder pain, increased tenderness at bicep tendon and was advised to ice, rest and stop craft activities for a short while to see if this made a difference in her right shoulder.  This week patient reports no pain and arm has felt better overall.  She still has some limitations for reaching above 90 degrees of shoulder flexion but has not experienced the catching and pain as she did last week.  Discussed activity modification if she wants to reengage in craft activities and to limit  time frames, stretching and ice.  New XS isotoner glove to assist with decreasing edema in right hand.  Continues to make progress with RUE function for daily tasks.                        OT Education - 05/17/20 0839    Education Details strengthening, ROM    Person(s) Educated Patient    Methods Explanation;Demonstration    Comprehension Verbalized understanding;Returned demonstration               OT Long Term Goals - 05/17/20 0856      OT LONG TERM GOAL #1   Title Patient will complete home exercise program with modified independence.    Baseline has some exercises but will need updates to current HEP    Time 8    Period Weeks    Status On-going    Target Date 06/21/20      OT LONG TERM GOAL #2   Title Pt will demonstrate improvement in FOTO score to 64 or greater to show a clinically relevant change to improve right hand function in daily tasks.    Baseline Score of 60 at eval    Time 8    Period Weeks    Status New    Target Date 06/21/20      OT LONG TERM GOAL #3   Title Pt will improve grip by 5# to assist with opening jars and containers    Baseline still has difficulty with managing containers, husband assists    Time 4    Period Weeks    Status On-going    Target Date 05/24/20      OT LONG TERM GOAL #4   Title Pt will decrease shoulder pain on the right to a 2 or less with reaching tasks involving shoulder flexion to place and remove items from a closet shelf.    Baseline pain increased to 6/10 at times with use of RUE, no pain at rest    Time 8    Period Weeks    Status On-going    Target Date 06/21/20      OT LONG TERM GOAL #5   Title Pt will improve R shoulder flexion by 10 degrees to use right UE during folding laundry, especially with larger items such as a sheet.    Baseline shoulder flexion 116    Time 8    Period Weeks    Status On-going    Target Date 06/21/20      OT LONG TERM GOAL #6   Title Pt will improve ER to be  able to reach and put on seat belt with pain less than 2/10.    Baseline pain with ER at eval    Time 4  Period Weeks    Status On-going    Target Date 05/24/20      OT LONG TERM GOAL #7   Title Pt to be able to vacuum 1-2 rooms with light weight vacuum and no rest breaks.    Baseline can only do 1 room at a time and requires rest breaks    Time 8    Period Weeks    Status On-going    Target Date 06/21/20                 Plan - 05/17/20 0840    Clinical Impression Statement Last session patient complained of shoulder pain, increased tenderness at bicep tendon and was advised to ice, rest and stop craft activities for a short while to see if this made a difference in her right shoulder.  This week patient reports no pain and arm has felt better overall.  She still has some limitations for reaching above 90 degrees of shoulder flexion but has not experienced the catching and pain as she did last week.  Discussed activity modification if she wants to reengage in craft activities and to limit time frames, stretching and ice.  New XS isotoner glove to assist with decreasing edema in right hand.  Continues to make progress with RUE function for daily tasks.    OT Occupational Profile and History Detailed Assessment- Review of Records and additional review of physical, cognitive, psychosocial history related to current functional performance    Occupational performance deficits (Please refer to evaluation for details): ADL's;IADL's;Work;Leisure    Body Structure / Function / Physical Skills ADL;Coordination;GMC;UE functional use;Balance;Decreased knowledge of use of DME;Flexibility;IADL;Dexterity;FMC;Proprioception;Strength;Edema;ROM    Cognitive Skills Memory    Psychosocial Skills Environmental  Adaptations;Habits;Routines and Behaviors    Rehab Potential Good    Clinical Decision Making Limited treatment options, no task modification necessary    Comorbidities Affecting Occupational  Performance: May have comorbidities impacting occupational performance    Modification or Assistance to Complete Evaluation  No modification of tasks or assist necessary to complete eval    OT Frequency 2x / week    OT Duration 8 weeks    OT Treatment/Interventions Self-care/ADL training;Cryotherapy;Therapeutic exercise;DME and/or AE instruction;Functional Mobility Training;Cognitive remediation/compensation;Balance training;Electrical Stimulation;Neuromuscular education;Manual Therapy;Splinting;Moist Heat;Contrast Bath;Therapeutic activities;Patient/family education;Passive range of motion    Consulted and Agree with Plan of Care Patient           Patient will benefit from skilled therapeutic intervention in order to improve the following deficits and impairments:   Body Structure / Function / Physical Skills: ADL,Coordination,GMC,UE functional use,Balance,Decreased knowledge of use of DME,Flexibility,IADL,Dexterity,FMC,Proprioception,Strength,Edema,ROM Cognitive Skills: Memory Psychosocial Skills: Environmental  Adaptations,Habits,Routines and Behaviors   Visit Diagnosis: Muscle weakness (generalized)  Other lack of coordination  Hemiplegia and hemiparesis following cerebral infarction affecting right dominant side [facility])    Problem List Patient Active Problem List   Diagnosis Date Noted  . Tinnitus of both ears 04/11/2020  . Insomnia 04/11/2020  . Stress incontinence 10/09/2019  . Morbid obesity (HCC) 08/15/2019  . Mixed hyperlipidemia 09/28/2018  . History of stroke 04/17/2018  . IFG (impaired fasting glucose) 03/28/2018  . Essential hypertension, benign 01/15/2017  . Asthma 01/15/2017  . Obstructive sleep apnea 10/18/2013   Kerrie Buffalo, OTR/L, CLT  Moroni Nester 05/17/2020, 9:47 AM  [place] [facility] MAIN [facility] SERVICES [address] [place], [place], [address] Phone: [phone]   Fax:  [phone]  Name: PHYLIS JAVED MRN:  [id number] Date of Birth: Jun 18, 1955

## 2020-05-20 ENCOUNTER — Ambulatory Visit: Payer: Medicaid Other | Admitting: Occupational Therapy

## 2020-05-20 ENCOUNTER — Other Ambulatory Visit: Payer: Self-pay

## 2020-05-20 DIAGNOSIS — R278 Other lack of coordination: Secondary | ICD-10-CM

## 2020-05-20 DIAGNOSIS — I69351 Hemiplegia and hemiparesis following cerebral infarction affecting right dominant side: Secondary | ICD-10-CM

## 2020-05-20 DIAGNOSIS — R2681 Unsteadiness on feet: Secondary | ICD-10-CM

## 2020-05-20 DIAGNOSIS — M6281 Muscle weakness (generalized): Secondary | ICD-10-CM

## 2020-05-23 ENCOUNTER — Encounter: Payer: Self-pay | Admitting: Nurse Practitioner

## 2020-05-23 ENCOUNTER — Other Ambulatory Visit: Payer: Self-pay

## 2020-05-23 ENCOUNTER — Ambulatory Visit (INDEPENDENT_AMBULATORY_CARE_PROVIDER_SITE_OTHER): Payer: Medicaid Other | Admitting: Nurse Practitioner

## 2020-05-23 ENCOUNTER — Encounter: Payer: Self-pay | Admitting: Occupational Therapy

## 2020-05-23 DIAGNOSIS — E870 Hyperosmolality and hypernatremia: Secondary | ICD-10-CM

## 2020-05-23 DIAGNOSIS — H9313 Tinnitus, bilateral: Secondary | ICD-10-CM | POA: Diagnosis not present

## 2020-05-23 DIAGNOSIS — F5101 Primary insomnia: Secondary | ICD-10-CM

## 2020-05-23 DIAGNOSIS — G4733 Obstructive sleep apnea (adult) (pediatric): Secondary | ICD-10-CM

## 2020-05-23 NOTE — Assessment & Plan Note (Addendum)
Recommend use of CPAP 100% of the time, has benefit from this.

## 2020-05-23 NOTE — Therapy (Signed)
[facility] MAIN [facility] SERVICES [address] [place], [place], [address] Phone: [phone]   Fax:  [phone]  Occupational Therapy Treatment  Patient Details  Name: Kristin Solis MRN: [id number] Date of Birth: 1955/11/06 Referring Provider (OT): Aura Dials   Encounter Date: 05/20/2020   OT End of Session - 05/25/20 2104    Visit Number 5    Number of Visits 16    Date for OT Re-Evaluation 06/21/20    Authorization Type Medicaid    OT Start Time 1050    OT Stop Time 1139    OT Time Calculation (min) 49 min    Activity Tolerance Patient tolerated treatment well    Behavior During Therapy [facility] for tasks assessed/performed           Past Medical History:  Diagnosis Date  . Asthma   . Hypertension   . Stroke [facility])     Past Surgical History:  Procedure Laterality Date  . ABDOMINAL HYSTERECTOMY  2005   total  . IR CT HEAD LTD  04/18/2018  . IR INTRAVSC STENT CERV CAROTID W/O EMB-PROT MOD SED INC ANGIO  04/18/2018  . IR PERCUTANEOUS ART THROMBECTOMY/INFUSION INTRACRANIAL INC DIAG ANGIO  04/18/2018  . OOPHORECTOMY    . RADIOLOGY WITH ANESTHESIA N/A 04/17/2018   Procedure: RADIOLOGY WITH ANESTHESIA;  Surgeon: Julieanne Cotton, MD;  Location: [facility] OR;  Service: Radiology;  Laterality: N/A;    There were no vitals filed for this visit.   Subjective Assessment - 05/25/20 2103    Subjective  Pt reports she is doing well, pain and motion has improved since resting arm and not participating in crafts over the last week.    Pertinent History Patient is a 65 y.o. female with history of stroke,  admitted to ED on 04/17/18 with dysphagia and right sided weakness s/p left MCA CVA, extubated on 04/19/18 and discharged on 04/23/18 to private residence    Patient Stated Goals Be able to move my arm up and down and use it like normal, get rid of the pain.    Currently in Pain? No/denies    Pain Score 0-No pain          Therapeutic Exercise: Pt seen  for UBE in standing, forwards/backwards, alternating levels of resistance from 0 to 1.0 for 8 mins total.  Therapist in constant attendance to ensure grip and adjust settings.   SAEBO ball tower for reaching tasks with right UE, moving from one level to the next  Patient engaging in shape tower with 4 levels to move items from one level to the next with occasional cues for technique.  Rest breaks as needed.   Grip strengthening for 20 reps for one set, resistance 17#  Response to tx:   Patient with decreased pain this date, right ROM improving with functional reach.  Patient able to perform multi level reaching tasks with effort.  Cues for sustained gripping patterns with use of hand gripper.  Continue to monitor pain and adjust activity as indicated.  Continue towards goals in plan of care to increase independence in daily tasks.                      OT Education - 05/25/20 2103    Education Details strengthening, ROM    Person(s) Educated Patient    Methods Explanation;Demonstration    Comprehension Verbalized understanding;Returned demonstration  OT Long Term Goals - 05/17/20 0856      OT LONG TERM GOAL #1   Title Patient will complete home exercise program with modified independence.    Baseline has some exercises but will need updates to current HEP    Time 8    Period Weeks    Status On-going    Target Date 06/21/20      OT LONG TERM GOAL #2   Title Pt will demonstrate improvement in FOTO score to 64 or greater to show a clinically relevant change to improve right hand function in daily tasks.    Baseline Score of 60 at eval    Time 8    Period Weeks    Status New    Target Date 06/21/20      OT LONG TERM GOAL #3   Title Pt will improve grip by 5# to assist with opening jars and containers    Baseline still has difficulty with managing containers, husband assists    Time 4    Period Weeks    Status On-going    Target Date 05/24/20       OT LONG TERM GOAL #4   Title Pt will decrease shoulder pain on the right to a 2 or less with reaching tasks involving shoulder flexion to place and remove items from a closet shelf.    Baseline pain increased to 6/10 at times with use of RUE, no pain at rest    Time 8    Period Weeks    Status On-going    Target Date 06/21/20      OT LONG TERM GOAL #5   Title Pt will improve R shoulder flexion by 10 degrees to use right UE during folding laundry, especially with larger items such as a sheet.    Baseline shoulder flexion 116    Time 8    Period Weeks    Status On-going    Target Date 06/21/20      OT LONG TERM GOAL #6   Title Pt will improve ER to be able to reach and put on seat belt with pain less than 2/10.    Baseline pain with ER at eval    Time 4    Period Weeks    Status On-going    Target Date 05/24/20      OT LONG TERM GOAL #7   Title Pt to be able to vacuum 1-2 rooms with light weight vacuum and no rest breaks.    Baseline can only do 1 room at a time and requires rest breaks    Time 8    Period Weeks    Status On-going    Target Date 06/21/20                 Plan - 05/25/20 2104    Clinical Impression Statement Patient with decreased pain this date, right ROM improving with functional reach.  Patient able to perform multi level reaching tasks with effort.  Cues for sustained gripping patterns with use of hand gripper.  Continue to monitor pain and adjust activity as indicated.  Continue towards goals in plan of care to increase independence in daily tasks.    OT Occupational Profile and History Detailed Assessment- Review of Records and additional review of physical, cognitive, psychosocial history related to current functional performance    Occupational performance deficits (Please refer to evaluation for details): ADL's;IADL's;Work;Leisure    Body Structure / Function / Physical Skills ADL;Coordination;GMC;UE functional  use;Balance;Decreased knowledge of  use of DME;Flexibility;IADL;Dexterity;FMC;Proprioception;Strength;Edema;ROM    Cognitive Skills Memory    Psychosocial Skills Environmental  Adaptations;Habits;Routines and Behaviors    Rehab Potential Good    Clinical Decision Making Limited treatment options, no task modification necessary    Comorbidities Affecting Occupational Performance: May have comorbidities impacting occupational performance    Modification or Assistance to Complete Evaluation  No modification of tasks or assist necessary to complete eval    OT Frequency 2x / week    OT Duration 8 weeks    OT Treatment/Interventions Self-care/ADL training;Cryotherapy;Therapeutic exercise;DME and/or AE instruction;Functional Mobility Training;Cognitive remediation/compensation;Balance training;Electrical Stimulation;Neuromuscular education;Manual Therapy;Splinting;Moist Heat;Contrast Bath;Therapeutic activities;Patient/family education;Passive range of motion    Consulted and Agree with Plan of Care Patient           Patient will benefit from skilled therapeutic intervention in order to improve the following deficits and impairments:   Body Structure / Function / Physical Skills: ADL,Coordination,GMC,UE functional use,Balance,Decreased knowledge of use of DME,Flexibility,IADL,Dexterity,FMC,Proprioception,Strength,Edema,ROM Cognitive Skills: Memory Psychosocial Skills: Environmental  Adaptations,Habits,Routines and Behaviors   Visit Diagnosis: Muscle weakness (generalized)  Other lack of coordination  Hemiplegia and hemiparesis following cerebral infarction affecting right dominant side (HCC)  Unsteadiness on feet    Problem List Patient Active Problem List   Diagnosis Date Noted  . Tinnitus of both ears 04/11/2020  . Insomnia 04/11/2020  . Stress incontinence 10/09/2019  . Morbid obesity (HCC) 08/15/2019  . Mixed hyperlipidemia 09/28/2018  . History of stroke 04/17/2018  . IFG (impaired fasting glucose) 03/28/2018   . Essential hypertension, benign 01/15/2017  . Asthma 01/15/2017  . Obstructive sleep apnea 10/18/2013   Kerrie Buffalo, OTR/L, CLT  Helmer Dull 05/25/2020, 9:14 PM  [facility] MAIN [facility] SERVICES [address] [place], [place], [address] Phone: [phone]   Fax:  [phone]  Name: CHAUNA OSORIA MRN: [id number] Date of Birth: [birth year]

## 2020-05-23 NOTE — Assessment & Plan Note (Signed)
Ongoing for 2 years since stroke, improved at this time with Melatonin and CPAP use.   Avoid Benadryl containing products.  Focus on sleep hygiene techniques.  If worsening consider addition of Trazodone.  Return in 6 months.

## 2020-05-23 NOTE — Patient Instructions (Signed)

## 2020-05-23 NOTE — Assessment & Plan Note (Signed)
BMI 40.71.  Recommended eating smaller high protein, low fat meals more frequently and exercising 30 mins a day 5 times a week with a goal of 10-15lb weight loss in the next 3 months. Patient voiced their understanding and motivation to adhere to these recommendations.

## 2020-05-23 NOTE — Assessment & Plan Note (Signed)
Ongoing post CVA 2 years ago, ears irrigated last visit and no improvement.  ENT referral placed for further evaluation and recommendations.

## 2020-05-23 NOTE — Progress Notes (Signed)
BP 129/76   Pulse (!) 59   Temp 98.5 F (36.9 C) (Oral)   Wt 222 lb 9.6 oz (101 kg)   SpO2 96%   BMI 40.71 kg/m    Subjective:    Patient ID: Kristin Solis, female    DOB: Dec 16, 1955, 65 y.o.   MRN: [id number]  HPI: Kristin Solis is a 65 y.o. female  Chief Complaint  Patient presents with  . Insomnia  . Tinnitus    Patient states she can hear well, but still notices ringing in her ears and states she only hears it when she is sitting in a quiet room. Patient states had her ears cleaned at her last visit, but as soon as she got her from the visit her ears began ringing again.   . Follow-up    Patient states is here to have her sodium levels recheck as she was informed they were a bit elevated at her last visit.   TINNITUS Has had for long while -- post stroke.  Had ears irrigated last visit and ongoing tinnitus present.  Does not matter location, hears it consistently.  Only time does not hear it is in shower.    Last visit her sodium was mildly elevated at 146 -- she has increased water intake at home.   Duration: months Description of tinnitus: ringing -- sometimes high and sometimes low -- since irrigation is lower then it has been Pulsatile: yes Tinnitus duration: continuous Episode frequency: continous Severity: mild Aggravating factors: none Alleviating factors: none Head injury: no Chronic exposure to loud noises: no Exposure to ototoxic medications: no Vertigo:in past x 1 Hearing loss: no Aural fullness: no Headache:no  TMJ syndrome symptoms: no Unsteady gait: no Postural instability: no Diplopia, dysarthria, dysphagia or weakness: no Anxiety/depression: no   INSOMNIA Currently is taking Melatonin, as instructed last visit, taking 10 MG and using CPAP which has helped.  Sleeping better at this time. Duration: months Satisfied with sleep quality: no Difficulty falling asleep: no Difficulty staying asleep: none Waking a few hours after sleep onset:  none Early morning awakenings: none Daytime hypersomnolence: none Wakes feeling refreshed: no Good sleep hygiene: yes Apnea: yes Snoring: yes Depressed/anxious mood: no Recent stress: no Restless legs/nocturnal leg cramps: no Chronic pain/arthritis: no History of sleep study: yes Treatments attempted: none    Depression screen [facility] 2/9 05/23/2020 05/23/2019 09/28/2018 06/27/2018 06/06/2018  Decreased Interest 0 0 0 0 0  Down, Depressed, Hopeless 0 0 0 0 0  PHQ - 2 Score 0 0 0 0 0  Altered sleeping - - 0 - -  Tired, decreased energy - - 0 - -  Change in appetite - - 0 - -  Feeling bad or failure about yourself  - - 0 - -  Trouble concentrating - - 0 - -  Moving slowly or fidgety/restless - - 0 - -  Suicidal thoughts - - 0 - -  PHQ-9 Score - - 0 - -   Relevant past medical, surgical, family and social history reviewed and updated as indicated. Interim medical history since our last visit reviewed. Allergies and medications reviewed and updated.  Review of Systems  Constitutional: Negative for activity change, appetite change, diaphoresis, fatigue and fever.  Respiratory: Negative for cough, chest tightness and shortness of breath.   Cardiovascular: Negative for chest pain, palpitations and leg swelling.  Gastrointestinal: Negative.   Neurological: Negative for dizziness, syncope, speech difficulty, weakness, light-headedness, numbness and headaches.  Psychiatric/Behavioral: Negative.  Per HPI unless specifically indicated above     Objective:    BP 129/76   Pulse (!) 59   Temp 98.5 F (36.9 C) (Oral)   Wt 222 lb 9.6 oz (101 kg)   SpO2 96%   BMI 40.71 kg/m   Wt Readings from Last 3 Encounters:  05/23/20 222 lb 9.6 oz (101 kg)  04/11/20 220 lb 3.2 oz (99.9 kg)  11/27/19 212 lb (96.2 kg)    Physical Exam Vitals and nursing note reviewed.  Constitutional:      General: She is awake. She is not in acute distress.    Appearance: She is well-developed. She is obese.  She is not ill-appearing.  HENT:     Head: Normocephalic.     Right Ear: Hearing, tympanic membrane, ear canal and external ear normal. No drainage.     Left Ear: Hearing, tympanic membrane, ear canal and external ear normal. No drainage.     Ears:     Weber exam findings: does not lateralize. Eyes:     General: Lids are normal.        Right eye: No discharge.        Left eye: No discharge.     Conjunctiva/sclera: Conjunctivae normal.     Pupils: Pupils are equal, round, and reactive to light.  Neck:     Thyroid: No thyromegaly.     Vascular: No carotid bruit.  Cardiovascular:     Rate and Rhythm: Normal rate and regular rhythm.     Heart sounds: Normal heart sounds. No murmur heard. No gallop.   Pulmonary:     Effort: Pulmonary effort is normal. No accessory muscle usage or respiratory distress.     Breath sounds: Normal breath sounds.  Abdominal:     General: Bowel sounds are normal.     Palpations: Abdomen is soft.  Musculoskeletal:     Right hand: Normal.     Left hand: Normal.     Cervical back: Normal range of motion and neck supple.     Right lower leg: No edema.     Left lower leg: No edema.  Skin:    General: Skin is warm and dry.  Neurological:     Mental Status: She is alert and oriented to person, place, and time.  Psychiatric:        Attention and Perception: Attention normal.        Mood and Affect: Mood normal.        Speech: Speech normal.        Behavior: Behavior normal. Behavior is cooperative.        Thought Content: Thought content normal.    Results for orders placed or performed in visit on 04/11/20  Comprehensive metabolic panel  Result Value Ref Range   Glucose 84 65 - 99 mg/dL   BUN 13 8 - 27 mg/dL   Creatinine, Ser 2.35 0.57 - 1.00 mg/dL   GFR calc non Af Amer 66 >59 mL/min/1.73   GFR calc Af Amer 76 >59 mL/min/1.73   BUN/Creatinine Ratio 14 12 - 28   Sodium 146 (H) 134 - 144 mmol/L   Potassium 4.1 3.5 - 5.2 mmol/L   Chloride 108 (H)  96 - 106 mmol/L   CO2 21 20 - 29 mmol/L   Calcium 9.5 8.7 - 10.3 mg/dL   Total Protein 6.7 6.0 - 8.5 g/dL   Albumin 4.0 3.8 - 4.8 g/dL   Globulin, Total 2.7 1.5 - 4.5 g/dL  Albumin/Globulin Ratio 1.5 1.2 - 2.2   Bilirubin Total 0.5 0.0 - 1.2 mg/dL   Alkaline Phosphatase 131 (H) 44 - 121 IU/L   AST 21 0 - 40 IU/L   ALT 19 0 - 32 IU/L  Lipid Panel w/o Chol/HDL Ratio  Result Value Ref Range   Cholesterol, Total 141 100 - 199 mg/dL   Triglycerides 52 0 - 149 mg/dL   HDL 72 >65 mg/dL   VLDL Cholesterol Cal 11 5 - 40 mg/dL   LDL Chol Calc (NIH) 58 0 - 99 mg/dL      Assessment & Plan:   Problem List Items Addressed This Visit      Respiratory   Obstructive sleep apnea    Recommend use of CPAP 100% of the time, has benefit from this.        Other   Morbid obesity (HCC) - Primary    BMI 40.71.  Recommended eating smaller high protein, low fat meals more frequently and exercising 30 mins a day 5 times a week with a goal of 10-15lb weight loss in the next 3 months. Patient voiced their understanding and motivation to adhere to these recommendations.       Tinnitus of both ears    Ongoing post CVA 2 years ago, ears irrigated last visit and no improvement.  ENT referral placed for further evaluation and recommendations.       Relevant Orders   Ambulatory referral to ENT   Insomnia    Ongoing for 2 years since stroke, improved at this time with Melatonin and CPAP use.   Avoid Benadryl containing products.  Focus on sleep hygiene techniques.  If worsening consider addition of Trazodone.  Return in 6 months.       Other Visit Diagnoses    Hypernatremia       Recheck NA+ today and continue good hydration daily + lower sodium intake.   Relevant Orders   Basic metabolic panel       Follow up plan: Return in about 6 months (around 11/22/2020) for HTN/HLD, INSOMNIA, ASTHMA, OSA.

## 2020-05-24 ENCOUNTER — Telehealth: Payer: Self-pay

## 2020-05-24 ENCOUNTER — Other Ambulatory Visit: Payer: Self-pay | Admitting: Nurse Practitioner

## 2020-05-24 DIAGNOSIS — E870 Hyperosmolality and hypernatremia: Secondary | ICD-10-CM

## 2020-05-24 LAB — BASIC METABOLIC PANEL
BUN/Creatinine Ratio: 17 (ref 12–28)
BUN: 15 mg/dL (ref 8–27)
CO2: 23 mmol/L (ref 20–29)
Calcium: 9.5 mg/dL (ref 8.7–10.3)
Chloride: 107 mmol/L — ABNORMAL HIGH (ref 96–106)
Creatinine, Ser: 0.87 mg/dL (ref 0.57–1.00)
Glucose: 84 mg/dL (ref 65–99)
Potassium: 4.4 mmol/L (ref 3.5–5.2)
Sodium: 147 mmol/L — ABNORMAL HIGH (ref 134–144)
eGFR: 75 mL/min/{1.73_m2} (ref >59)

## 2020-05-24 NOTE — Telephone Encounter (Signed)
FYI

## 2020-05-24 NOTE — Telephone Encounter (Signed)
Good, I have ordered outpatient labs for 4 weeks and alerted her via MyChart to schedule a lab visit only.  Can you make sure she is scheduled?  Thank you:)

## 2020-05-24 NOTE — Progress Notes (Signed)
Contacted via [place] morning Elspeth, your labs have returned.  Kidney function, eGFR and creatinine, are normal.  Your sodium and chloride remain mildly elevated, I would like to recheck this via an outpatient lab visit only in 4 weeks please.  Could you please schedule this with my staff.  I do recommend you continue to increase water intake daily and decrease salt intake.  Also for outpatient lab, no need to fast.  You can drink some water ahead of time.  Have you had any diarrhea or vomiting lately?  Let me know.  Any questions? Keep being amazing!!  Thank you for allowing me to participate in your care. Kindest regards, Dhalia Zingaro

## 2020-05-24 NOTE — Telephone Encounter (Signed)
Copied from CRM [id number]. Topic: General - Other >> May 24, 2020  8:19 AM Wyonia Hough E wrote: Reason for CRM: Pt stated she received a message from [facility] and called to let Jolene know that she has not been vomiting or had any diarrhea / Lorain Childes

## 2020-05-27 ENCOUNTER — Ambulatory Visit: Payer: Medicaid Other | Admitting: Occupational Therapy

## 2020-05-27 ENCOUNTER — Other Ambulatory Visit: Payer: Self-pay

## 2020-05-27 ENCOUNTER — Ambulatory Visit: Payer: Medicaid Other | Admitting: Adult Health

## 2020-05-27 ENCOUNTER — Encounter: Payer: Self-pay | Admitting: Adult Health

## 2020-05-27 VITALS — BP 150/82 | HR 61 | Ht 62.0 in | Wt 221.0 lb

## 2020-05-27 DIAGNOSIS — I63512 Cerebral infarction due to unspecified occlusion or stenosis of left middle cerebral artery: Secondary | ICD-10-CM

## 2020-05-27 DIAGNOSIS — R2681 Unsteadiness on feet: Secondary | ICD-10-CM

## 2020-05-27 DIAGNOSIS — M6281 Muscle weakness (generalized): Secondary | ICD-10-CM | POA: Diagnosis not present

## 2020-05-27 DIAGNOSIS — R278 Other lack of coordination: Secondary | ICD-10-CM

## 2020-05-27 DIAGNOSIS — I69351 Hemiplegia and hemiparesis following cerebral infarction affecting right dominant side: Secondary | ICD-10-CM

## 2020-05-27 NOTE — Progress Notes (Signed)
[facility] [address] [place]. [place] [address] [phone]       OFFICE FOLLOW UP NOTE  [name] Date of Birth:  1955-08-16 Medical Record Number:  [id number]   Reason for Referral: stroke follow up    CHIEF COMPLAINT:  Chief Complaint  Patient presents with  . Follow-up    RM 14 alone Pt is well, making good progress. R arm limited to movement, balance disturbance     HPI:  Today, 05/27/2020, Kristin Solis returns for 46-month stroke follow-up unaccompanied  Stable from stroke standpoint without new stroke/TIA symptoms Reports residual mild RUE weakness and mild imbalance Difficulty with complete right shoulder range of motion and pain but has been working with OT with improvement Use of cane for "security" for long distance - does not need in house - denies any recent falls  Continues on Asprin and atorvastatin - denies any associated side effects Blood pressure today - monitors at home and typically 110-120/70s Lipid panel 03/2020 LDL 58  No new concerns at this time     History provided for reference purposes only Update 11/27/2019 JM: Kristin Solis returns for stroke follow-up unaccompanied.  She has made great progress since prior visit with only minimal residual RUE weakness. Completed therapy for the year as she completed all allowed visits per insurance.  She plans on restarting in January.  She continues to do exercises at home with with continued improvement even doing large size crochet blankets. Continues to ambulate with cane for long distance but does not use in her home. Denies any recent falls. Denies new or worsening stroke/TIA symptoms.  Remains on aspirin 81 mg daily without bleeding or bruising.  Remains on atorvastatin 40 mg daily without myalgias.  Blood pressure today 140/88. Monitors at home which has been stable.  No concerns at this time.  Update 04/23/2019 JM: Kristin Solis is a 65 year old female who is being seen today,  04/23/2019, for stroke follow-up.  She has been doing well from a stroke standpoint with residual mild right hemiparesis but overall greatly improving.  She continues to work with outpatient therapies.  She does endorse ongoing right shoulder pain that limits range of motion but is hopeful that ongoing therapy will be beneficial.  Repeat carotid ultrasound on 12/01/2018 showed left ICA stent less than 50% stenosis and 1 to 39% right ICA stenosis.  Brilinta discontinued as 6 months post stent placement and continues on aspirin alone without bleeding or bruising.  Continues on atorvastatin 40 mg daily myalgias.  Reports that recent lab work by PCP with lipid panel satisfactory.  Blood pressure today 146/66.  No further concerns at this time.  Stroke admission 04/17/2018: Kristin Solis a 65 y.o.femalewith history of asthma and HTN who presented to [facility] with dysphasia and R HP which occurred while talking about a traumatic event. Symptoms completely resolved prior to ED. While in the ED, she began having difficulty speaking again along with dense right hemiparesis. She receivedtPA 04/17/2018 at 2032.CTA showed a dense LM1 occlusion with large ischemic penumbra on perfusion and CTA neck showed a left ICA embolus. She was transferred to [facility] and sent to IR for mechanical thrombectomy where she had TICI 3 reperfusion using ambo trap and stent assisted angioplasty of severely stenotic L ICA at the bulb.  Post CT head negative for ICH, mass-effect or midline shift.  MRI brain reviewed and showed multifocal acute left MCA territory infarcts and anterior/posterior border zone infarcts with scattered petechial  hemorrhage secondary to large vessel disease.  2D echo unremarkable without cardiac source of embolism identified.  Initiated aspirin 81 mg and Brilinta post stent placement and secondary stroke prevention.  Initially found to be in hypertensive emergency and treated with Cleviprex which stabilized  throughout admission and recommended long-term BP goal normotensive range.  LDL 102 and initiated atorvastatin 40 mg daily.  Diagnosed with OSA during inpatient and CPAP device supplied at discharge.  Other stroke risk factors include obesity, prior stroke per imaging and family history of stroke.  She had residual deficits of right hemiparesis but no residual speech difficulties and discharged home in stable condition with recommendations of home health PT/OT.  Initial visit 06/06/2018 JM: she continues to have residual deficits of right hemiparesis but does endorse improvement and endorses resolution of speech difficulty.  She did not receive any home health therapies as per patient, her insurance did not cover this.  She has been doing exercises on her own at home.  She has been able to continue ADLs independently but has required assistance with IADLs.  She endorses difficulty writing as she is right-hand dominant.  She is able to ambulate without assistive device within her home but will use a cane or rolling walker for long distance.  During conversation of ongoing deficits and difficulties, patient becomes tearful.  Discussion regarding potential post stroke depression with PHQ 2 score 0.  She denies underlying history of depression or anxiety.  She continues to be motivated for ongoing improvement but becomes upset when she attempts to try to do something with her right side and she is unable to or she speaks about it.  She continues on aspirin and Brilinta without side effects of bleeding or bruising.  Continues on atorvastatin without side effects of myalgias.  Blood pressure monitored at home and typically ranges 120s/70s.  She continues to be compliant with CPAP for OSA management.  She has not scheduled appointment at this time with vascular surgery as recommended at 4 weeks post discharge.  No further concerns at this time.  Denies new or worsening stroke/TIA symptoms.  Update 11/23/2018 Dr. Pearlean BrownieSethi :  She returns for follow-up after last visit nearly 6 months ago.  She is accompanied by her husband.  She is doing well and states that her speech difficulties have recovered almost back to baseline.  She occasionally has some hesitation.  She is also regained strength on the right side with the right arm particularly the hand is still weak.  She is able to ambulate independently indoors and not short distances but does use a walker for outdoors and long distances.  She has had no falls or injuries.  She recently got Medicaid and will be starting outpatient physical and occupational therapy in [place] soon.  She is tolerating aspirin and Brilinta but does has easy bruising.  She has not had any follow-up carotid ultrasound done to image her carotid stent.  She states her blood pressure is under good control and today it is 137/77.  She remains on Lipitor which she is tolerating well without muscle aches and pains and states her primary care physician did check lipid profile a few months ago and it was satisfactory.  She is eating healthy and has lost weight steadily.  She is also active.  She has no new complaints.     ROS:   14 system review of systems performed and negative with exception of those listed in HPI  PMH:  Past Medical History:  Diagnosis Date  . Asthma   . Hypertension   . Stroke [facility])     PSH:  Past Surgical History:  Procedure Laterality Date  . ABDOMINAL HYSTERECTOMY  2005   total  . IR CT HEAD LTD  04/18/2018  . IR INTRAVSC STENT CERV CAROTID W/O EMB-PROT MOD SED INC ANGIO  04/18/2018  . IR PERCUTANEOUS ART THROMBECTOMY/INFUSION INTRACRANIAL INC DIAG ANGIO  04/18/2018  . OOPHORECTOMY    . RADIOLOGY WITH ANESTHESIA N/A 04/17/2018   Procedure: RADIOLOGY WITH ANESTHESIA;  Surgeon: Julieanne Cotton, MD;  Location: [facility] OR;  Service: Radiology;  Laterality: N/A;    Social History:  Social History   Socioeconomic History  . Marital status: Married    Spouse name: Not on file   . Number of children: Not on file  . Years of education: Not on file  . Highest education level: Not on file  Occupational History  . Not on file  Tobacco Use  . Smoking status: Never Smoker  . Smokeless tobacco: Never Used  Vaping Use  . Vaping Use: Never used  Substance and Sexual Activity  . Alcohol use: No  . Drug use: No  . Sexual activity: Never  Other Topics Concern  . Not on file  Social History Narrative  . Not on file   Social Determinants of Health   Financial Resource Strain: Not on file  Food Insecurity: Not on file  Transportation Needs: Not on file  Physical Activity: Not on file  Stress: Not on file  Social Connections: Not on file  Intimate Partner Violence: Not on file    Family History:  Family History  Problem Relation Age of Onset  . Cancer Mother        lung  . Cancer Father        brain  . Diabetes Sister   . Alcohol abuse Brother   . Stroke Son        x2  . CAD Maternal Grandmother   . Emphysema Paternal Grandfather   . Heart disease Sister        CHF  . Breast cancer Neg Hx     Medications:   Current Outpatient Medications on File Prior to Visit  Medication Sig Dispense Refill  . albuterol (VENTOLIN HFA) 108 (90 Base) MCG/ACT inhaler Inhale 2 puffs into the lungs every 6 (six) hours as needed for wheezing or shortness of breath. 16 g 3  . aspirin EC 81 MG EC tablet Take 1 tablet (81 mg total) by mouth daily.    [name] atorvastatin (LIPITOR) 40 MG tablet Take 1 tablet (40 mg total) by mouth daily at 6 PM. 90 tablet 4  . Blood Pressure Monitoring (OMRON WRIST BP MONITOR) DEVI To check blood pressure once daily due to past stroke. 1 each 1  . fluticasone (FLONASE) 50 MCG/ACT nasal spray Place 2 sprays into both nostrils daily. 11.1 g 1  . Fluticasone-Salmeterol (ADVAIR DISKUS) 250-50 MCG/DOSE AEPB Inhale 1 puff into the lungs 2 (two) times daily. 240 each 3  . lisinopril (ZESTRIL) 5 MG tablet Take 1 tablet (5 mg total) by mouth daily. 90  tablet 4   No current facility-administered medications on file prior to visit.    Allergies:  No Known Allergies   Physical Exam  Vitals:   05/27/20 1504  BP: (!) 163/79  Pulse: 61  Weight: 221 lb (100.2 kg)  Height: 5\' 2"  (1.575 m)   Body mass index is 40.42 kg/m. No exam data present  General: Obese pleasant middle-age Caucasian female, seated, in no evident distress Head: head normocephalic and atraumatic.   Neck: supple with no carotid or supraclavicular bruits Cardiovascular: regular rate and rhythm, no murmurs Musculoskeletal: no deformity Skin:  no rash/petichiae Vascular:  Normal pulses all extremities   Neurologic Exam Mental Status: Awake and fully alert.   Fluent speech and language.  Oriented to place and time. Recent and remote memory intact. Attention span, concentration and fund of knowledge appropriate. Mood and affect appropriate.  Cranial Nerves: Pupils equal, briskly reactive to light. Extraocular movements full without nystagmus. Visual fields full to confrontation. Hearing intact. Facial sensation intact. Face, tongue, palate moves normally and symmetrically.  Motor: Normal bulk and tone and strength in all tested extremities. Slight guarding with right shoulder testing in setting of pain - unable to appreciate true weakness Sensory.: intact to touch , pinprick , position and vibratory sensation.  Coordination: Rapid alternating movements normal in all extremities. Finger-to-nose and heel-to-shin performed accurately bilaterally. Gait and Station: Arises from chair without difficulty. Stance is normal. Gait demonstrates normal stride length and balance with use of cane Reflexes: 1+ and symmetric. Toes downgoing.       ASSESSMENT/PLAN: JANIA STEINKE is a 65 y.o. year old female here with left MCA infarct status post TPA with TICI 3 reperfusion left ICA rescue stent on 04/17/2018 secondary to large vessel disease extracranial carotid stenosis. Vascular  risk factors include HTN, HLD, new diagnosis of OSA, carotid stenosis and obesity.  Repeat carotid ultrasound stable.      1. Left MCA infarct:  a. Residual deficit: Subjective mild RUE weakness with decreased shoulder ROM and pain and mild imbalance-advised use of cane for long distance unless otherwise instructed for fall prevention.  Continue working with OT with ongoing improvements of right shoulder pain b. Continue aspirin 81 mg daily  and atorvastatin for secondary stroke prevention.  c. Discussed secondary stroke prevention measures and importance of close PCP follow-up for aggressive stroke risk factor management 2. L ICA stent: carotid duplex 11/2019 R ICA 1 to 39% stenosis and I ICA <50% stent stenosis - stable compared to prior imaging. Continue to follow with IR 3. HTN: BP goal<130/90.  Slightly elevated today but stable at home per pt on lisinopril per PCP 4. HLD: LDL goal<70.  On atorvastatin 40 mg daily per PCP.  Recent lipid panel satisfactory    Overall stable from stroke standpoint and recommend follow-up on an as-needed basis   CC:  [facility] provider: Dr. Karalee Height, [name], NP    I spent 30 minutes of face-to-face and non-face-to-face time with patient.  This included previsit chart review, lab review, study review, order entry, electronic health record documentation, patient education and discussion regarding prior stroke, residual deficits, secondary stroke prevention measures and importance of aggressive stroke risk factor management and answered all other questions to patient satisfaction   Ihor Austin, [facility]  [facility] [address] [place], [place] [address]  Phone [phone] Fax [phone] Note: This document was prepared with digital dictation and possible smart phrase technology. Any transcriptional errors that result from this process are unintentional.

## 2020-05-27 NOTE — Patient Instructions (Signed)
Continue aspirin 81 mg daily  and atorvastatin for secondary stroke prevention  Continue to follow up with PCP regarding cholesterol and blood pressure management  Maintain strict control of hypertension with blood pressure goal below 130/90 and cholesterol with LDL cholesterol (bad cholesterol) goal below 70 mg/dL.        Thank you for coming to see us at [facility]. I hope we have been able to provide you high quality care today.  You may receive a patient satisfaction survey over the next few weeks. We would appreciate your feedback and comments so that we may continue to improve ourselves and the health of our patients.    

## 2020-05-29 ENCOUNTER — Encounter: Payer: Self-pay | Admitting: Occupational Therapy

## 2020-05-29 NOTE — Therapy (Signed)
[facility] MAIN [facility] SERVICES [address] [place], [place], [address] Phone: [phone]   Fax:  [phone]  Occupational Therapy Treatment  Patient Details  Name: Kristin Solis MRN: [id number] Date of Birth: 07/04/1955 Referring Provider (OT): Aura Dials   Encounter Date: 05/27/2020   OT End of Session - 05/29/20 1900    Visit Number 6    Number of Visits 16    Date for OT Re-Evaluation 06/21/20    Authorization Type Medicaid    OT Start Time 1102    OT Stop Time 1150    OT Time Calculation (min) 48 min    Activity Tolerance Patient tolerated treatment well    Behavior During Therapy [facility] for tasks assessed/performed           Past Medical History:  Diagnosis Date  . Asthma   . Hypertension   . Stroke [facility])     Past Surgical History:  Procedure Laterality Date  . ABDOMINAL HYSTERECTOMY  2005   total  . IR CT HEAD LTD  04/18/2018  . IR INTRAVSC STENT CERV CAROTID W/O EMB-PROT MOD SED INC ANGIO  04/18/2018  . IR PERCUTANEOUS ART THROMBECTOMY/INFUSION INTRACRANIAL INC DIAG ANGIO  04/18/2018  . OOPHORECTOMY    . RADIOLOGY WITH ANESTHESIA N/A 04/17/2018   Procedure: RADIOLOGY WITH ANESTHESIA;  Surgeon: Julieanne Cotton, MD;  Location: [facility] OR;  Service: Radiology;  Laterality: N/A;    There were no vitals filed for this visit.   Subjective Assessment - 05/29/20 1859    Subjective  Patient reports 3/10 pain shoulder today, not sure what is different from last week, she reports she did try some resistive band exercises at home but had difficulty.  She did some crafts but only a few mins each day.  She also feels the weather sometimes has an effect on her shoulder pain.    Pertinent History Patient is a 65 y.o. female with history of stroke,  admitted to ED on 04/17/18 with dysphagia and right sided weakness s/p left MCA CVA, extubated on 04/19/18 and discharged on 04/23/18 to private residence           Patient reports 3/10 pain  shoulder today, not sure what is different from last week, she reports she did try some resistive band exercises at home but had difficulty.  She did some crafts but only a few mins each day.  She also feels the weather sometimes has an effect on her shoulder pain.   Patient seen for bilateral UE ROM and strengthening with use of UBE, performed in standing, forwards/backwards, alternating levels of resistance of 0 to 1.0 with therapist in constant attendance to ensure grip and to adjust settings.  Attempted to find resistance band similar to patient's band at home, yellow band was likely similar but produced pain.  Switched to use of 1# dowel exercise for shoulder flexion to 100 degrees, chest press, ABD/ADD, forwards and backwards circles, 12 reps each for 2 sets with therapist demonstration and cues.  Added dowel climb with dowel in vertical position on tabletop and pt moving hand over hand up to the top of the dowel and back down.  Tabletop stretches with use of washcloth under right hand, forwards, backwards, side to side and circular motions both directions from seated position.  Grip strengthening on right with use of hand gripper with resistance spring placed on  3rd setting (17#) , performing 20 reps for 2 sets with occasional cues for proper hand  position on gripper and technique.   Response to tx:  Patient with increased pain again in right UE, has a MD appt this week and will discuss issues with motion and pain.  She describes increased pain at times with weather changes and has had issues prior to the stroke with her right shoulder and received injections in the past.  It does appear if she uses the right arm with repetitive movements when performing crafts it tends to produce pain along the bicep tendon.  Recommend she continue to alter patterns of use, ice, rest and ROM within toleration.  Continue OT to increase ROM, strength and use of right UE for necessary daily tasks at home and in the  community.                       OT Education - 05/29/20 1900    Education Details strengthening, ROM, HEP, ice    Person(s) Educated Patient    Methods Explanation;Demonstration    Comprehension Verbalized understanding;Returned demonstration               OT Long Term Goals - 05/17/20 0856      OT LONG TERM GOAL #1   Title Patient will complete home exercise program with modified independence.    Baseline has some exercises but will need updates to current HEP    Time 8    Period Weeks    Status On-going    Target Date 06/21/20      OT LONG TERM GOAL #2   Title Pt will demonstrate improvement in FOTO score to 64 or greater to show a clinically relevant change to improve right hand function in daily tasks.    Baseline Score of 60 at eval    Time 8    Period Weeks    Status New    Target Date 06/21/20      OT LONG TERM GOAL #3   Title Pt will improve grip by 5# to assist with opening jars and containers    Baseline still has difficulty with managing containers, husband assists    Time 4    Period Weeks    Status On-going    Target Date 05/24/20      OT LONG TERM GOAL #4   Title Pt will decrease shoulder pain on the right to a 2 or less with reaching tasks involving shoulder flexion to place and remove items from a closet shelf.    Baseline pain increased to 6/10 at times with use of RUE, no pain at rest    Time 8    Period Weeks    Status On-going    Target Date 06/21/20      OT LONG TERM GOAL #5   Title Pt will improve R shoulder flexion by 10 degrees to use right UE during folding laundry, especially with larger items such as a sheet.    Baseline shoulder flexion 116    Time 8    Period Weeks    Status On-going    Target Date 06/21/20      OT LONG TERM GOAL #6   Title Pt will improve ER to be able to reach and put on seat belt with pain less than 2/10.    Baseline pain with ER at eval    Time 4    Period Weeks    Status On-going     Target Date 05/24/20      OT LONG TERM GOAL #  7   Title Pt to be able to vacuum 1-2 rooms with light weight vacuum and no rest breaks.    Baseline can only do 1 room at a time and requires rest breaks    Time 8    Period Weeks    Status On-going    Target Date 06/21/20                 Plan - 05/29/20 1901    Clinical Impression Statement Patient with increased pain again in right UE, has a MD appt this week and will discuss issues with motion and pain.  She describes increased pain at times with weather changes and has had issues prior to the stroke with her right shoulder and received injections in the past.  It does appear if she uses the right arm with repetitive movements when performing crafts it tends to produce pain along the bicep tendon.  Recommend she continue to alter patterns of use, ice, rest and ROM within toleration.  Continue OT to increase ROM, strength and use of right UE for necessary daily tasks at home and in the community.    OT Occupational Profile and History Detailed Assessment- Review of Records and additional review of physical, cognitive, psychosocial history related to current functional performance    Occupational performance deficits (Please refer to evaluation for details): ADL's;IADL's;Work;Leisure    Body Structure / Function / Physical Skills ADL;Coordination;GMC;UE functional use;Balance;Decreased knowledge of use of DME;Flexibility;IADL;Dexterity;FMC;Proprioception;Strength;Edema;ROM    Cognitive Skills Memory    Psychosocial Skills Environmental  Adaptations;Habits;Routines and Behaviors    Rehab Potential Good    Clinical Decision Making Limited treatment options, no task modification necessary    Comorbidities Affecting Occupational Performance: May have comorbidities impacting occupational performance    Modification or Assistance to Complete Evaluation  No modification of tasks or assist necessary to complete eval    OT Frequency 2x / week    OT  Duration 8 weeks    OT Treatment/Interventions Self-care/ADL training;Cryotherapy;Therapeutic exercise;DME and/or AE instruction;Functional Mobility Training;Cognitive remediation/compensation;Balance training;Electrical Stimulation;Neuromuscular education;Manual Therapy;Splinting;Moist Heat;Contrast Bath;Therapeutic activities;Patient/family education;Passive range of motion    Consulted and Agree with Plan of Care Patient           Patient will benefit from skilled therapeutic intervention in order to improve the following deficits and impairments:   Body Structure / Function / Physical Skills: ADL,Coordination,GMC,UE functional use,Balance,Decreased knowledge of use of DME,Flexibility,IADL,Dexterity,FMC,Proprioception,Strength,Edema,ROM Cognitive Skills: Memory Psychosocial Skills: Environmental  Adaptations,Habits,Routines and Behaviors   Visit Diagnosis: Muscle weakness (generalized)  Other lack of coordination  Hemiplegia and hemiparesis following cerebral infarction affecting right dominant side (HCC)  Unsteadiness on feet    Problem List Patient Active Problem List   Diagnosis Date Noted  . Tinnitus of both ears 04/11/2020  . Insomnia 04/11/2020  . Stress incontinence 10/09/2019  . Morbid obesity (HCC) 08/15/2019  . Mixed hyperlipidemia 09/28/2018  . History of stroke 04/17/2018  . IFG (impaired fasting glucose) 03/28/2018  . Essential hypertension, benign 01/15/2017  . Asthma 01/15/2017  . Obstructive sleep apnea 10/18/2013   Kerrie Buffalo, OTR/L, CLT Kristin Solis 05/29/2020, 7:10 PM  [facility] MAIN [facility] SERVICES [address] [place], [place], [address] Phone: [phone]   Fax:  [phone]  Name: Kristin Solis MRN: [id number] Date of Birth: 1955/06/10

## 2020-06-10 ENCOUNTER — Ambulatory Visit: Payer: Medicaid Other | Admitting: Occupational Therapy

## 2020-06-10 ENCOUNTER — Other Ambulatory Visit: Payer: Self-pay

## 2020-06-10 ENCOUNTER — Encounter: Payer: Self-pay | Admitting: Occupational Therapy

## 2020-06-10 DIAGNOSIS — M6281 Muscle weakness (generalized): Secondary | ICD-10-CM | POA: Diagnosis not present

## 2020-06-10 DIAGNOSIS — I69351 Hemiplegia and hemiparesis following cerebral infarction affecting right dominant side: Secondary | ICD-10-CM

## 2020-06-10 DIAGNOSIS — R278 Other lack of coordination: Secondary | ICD-10-CM

## 2020-06-10 NOTE — Therapy (Signed)
[facility] MAIN [facility] SERVICES [address] [place], [place], [address] Phone: [phone]   Fax:  [phone]  Occupational Therapy Treatment  Patient Details  Name: Kristin Solis MRN: [id number] Date of Birth: 09/10/1955 Referring Provider (OT): Aura Dials   Encounter Date: 06/10/2020   OT End of Session - 06/11/20 1610    Visit Number 7    Number of Visits 16    Date for OT Re-Evaluation 06/21/20    Authorization Type Medicaid    OT Start Time 1110    OT Stop Time 1200    OT Time Calculation (min) 50 min    Activity Tolerance Patient tolerated treatment well    Behavior During Therapy [facility] for tasks assessed/performed           Past Medical History:  Diagnosis Date  . Asthma   . Hypertension   . Stroke [facility]     Past Surgical History:  Procedure Laterality Date  . ABDOMINAL HYSTERECTOMY  2005   total  . IR CT HEAD LTD  04/18/2018  . IR INTRAVSC STENT CERV CAROTID W/O EMB-PROT MOD SED INC ANGIO  04/18/2018  . IR PERCUTANEOUS ART THROMBECTOMY/INFUSION INTRACRANIAL INC DIAG ANGIO  04/18/2018  . OOPHORECTOMY    . RADIOLOGY WITH ANESTHESIA N/A 04/17/2018   Procedure: RADIOLOGY WITH ANESTHESIA;  Surgeon: Julieanne Cotton, MD;  Location: [facility] OR;  Service: Radiology;  Laterality: N/A;    There were no vitals filed for this visit.   Subjective Assessment - 06/11/20 1609    Subjective  Pt denies any pain today, doing well with crafts    Pertinent History Patient is a 65 y.o. female with history of stroke,  admitted to ED on 04/17/18 with dysphagia and right sided weakness s/p left MCA CVA, extubated on 04/19/18 and discharged on 04/23/18 to private residence    Patient Stated Goals Be able to move my arm up and down and use it like normal, get rid of the pain.    Currently in Pain? No/denies    Pain Score 0-No pain           Pt seen for UE ROM and strengthening with use of UBE, forwards/backwards, alternating levels of resistance  from 1.0 to 2.0 for 8 mins total, therapist in constant attendance to ensure grip and to adjust settings.    Dowel exercises with use of 1# for one set, 2# for second set Shoulder flexion, ABD, ADD, chest press, forwards and backwards circles, diagonal patterns, dowel climbing 10-12 reps for 2 sets. Therapist demo and cues for proper form and technique.    Grip strengthening for 20 reps with 3rd setting, 17#  Second set with 4th setting 23# for 8 reps then remaining 12 reps with 17#, cues for hand placement on gripper  Response to tx: Patient with no pain this date, arm feeling better.  Pt demonstrates active ROM but still requires effort for movement patterns shoulder height and above.  She responds well to cues.  Has been able to reincorporate crafts into her daily routine with limiting the amount of time spent on craft and sticking with shorter durations, rest breaks as needed.  Pt continues to progress and benefit from skilled OT services to maximize safety and independence in necessary daily tasks                       OT Education - 06/11/20 1609    Education Details strengthening, ROM, HEP,  ice    Person(s) Educated Patient    Methods Explanation;Demonstration    Comprehension Verbalized understanding;Returned demonstration               OT Long Term Goals - 05/17/20 0856      OT LONG TERM GOAL #1   Title Patient will complete home exercise program with modified independence.    Baseline has some exercises but will need updates to current HEP    Time 8    Period Weeks    Status On-going    Target Date 06/21/20      OT LONG TERM GOAL #2   Title Pt will demonstrate improvement in FOTO score to 64 or greater to show a clinically relevant change to improve right hand function in daily tasks.    Baseline Score of 60 at eval    Time 8    Period Weeks    Status New    Target Date 06/21/20      OT LONG TERM GOAL #3   Title Pt will improve grip by 5# to  assist with opening jars and containers    Baseline still has difficulty with managing containers, husband assists    Time 4    Period Weeks    Status On-going    Target Date 05/24/20      OT LONG TERM GOAL #4   Title Pt will decrease shoulder pain on the right to a 2 or less with reaching tasks involving shoulder flexion to place and remove items from a closet shelf.    Baseline pain increased to 6/10 at times with use of RUE, no pain at rest    Time 8    Period Weeks    Status On-going    Target Date 06/21/20      OT LONG TERM GOAL #5   Title Pt will improve R shoulder flexion by 10 degrees to use right UE during folding laundry, especially with larger items such as a sheet.    Baseline shoulder flexion 116    Time 8    Period Weeks    Status On-going    Target Date 06/21/20      OT LONG TERM GOAL #6   Title Pt will improve ER to be able to reach and put on seat belt with pain less than 2/10.    Baseline pain with ER at eval    Time 4    Period Weeks    Status On-going    Target Date 05/24/20      OT LONG TERM GOAL #7   Title Pt to be able to vacuum 1-2 rooms with light weight vacuum and no rest breaks.    Baseline can only do 1 room at a time and requires rest breaks    Time 8    Period Weeks    Status On-going    Target Date 06/21/20                 Plan - 06/11/20 1610    Clinical Impression Statement Patient with no pain this date, arm feeling better.  Pt demonstrates active ROM but still requires effort for movement patterns shoulder height and above.  She responds well to cues.  Has been able to reincorporate crafts into her daily routine with limiting the amount of time spent on craft and sticking with shorter durations, rest breaks as needed.  Pt continues to progress and benefit from skilled OT services to maximize safety and independence  in necessary daily tasks    OT Occupational Profile and History Detailed Assessment- Review of Records and additional  review of physical, cognitive, psychosocial history related to current functional performance    Occupational performance deficits (Please refer to evaluation for details): ADL's;IADL's;Work;Leisure    Body Structure / Function / Physical Skills ADL;Coordination;GMC;UE functional use;Balance;Decreased knowledge of use of DME;Flexibility;IADL;Dexterity;FMC;Proprioception;Strength;Edema;ROM    Cognitive Skills Memory    Psychosocial Skills Environmental  Adaptations;Habits;Routines and Behaviors    Rehab Potential Good    Clinical Decision Making Limited treatment options, no task modification necessary    Comorbidities Affecting Occupational Performance: May have comorbidities impacting occupational performance    Modification or Assistance to Complete Evaluation  No modification of tasks or assist necessary to complete eval    OT Frequency 2x / week    OT Duration 8 weeks    OT Treatment/Interventions Self-care/ADL training;Cryotherapy;Therapeutic exercise;DME and/or AE instruction;Functional Mobility Training;Cognitive remediation/compensation;Balance training;Electrical Stimulation;Neuromuscular education;Manual Therapy;Splinting;Moist Heat;Contrast Bath;Therapeutic activities;Patient/family education;Passive range of motion    Consulted and Agree with Plan of Care Patient           Patient will benefit from skilled therapeutic intervention in order to improve the following deficits and impairments:   Body Structure / Function / Physical Skills: ADL,Coordination,GMC,UE functional use,Balance,Decreased knowledge of use of DME,Flexibility,IADL,Dexterity,FMC,Proprioception,Strength,Edema,ROM Cognitive Skills: Memory Psychosocial Skills: Environmental  Adaptations,Habits,Routines and Behaviors   Visit Diagnosis: Muscle weakness (generalized)  Other lack of coordination  Hemiplegia and hemiparesis following cerebral infarction affecting right dominant side [facility])    Problem List Patient  Active Problem List   Diagnosis Date Noted  . Tinnitus of both ears 04/11/2020  . Insomnia 04/11/2020  . Stress incontinence 10/09/2019  . Morbid obesity (HCC) 08/15/2019  . Mixed hyperlipidemia 09/28/2018  . History of stroke 04/17/2018  . IFG (impaired fasting glucose) 03/28/2018  . Essential hypertension, benign 01/15/2017  . Asthma 01/15/2017  . Obstructive sleep apnea 10/18/2013   Kerrie Buffalo, OTR/L, CLT  Kristin Solis 06/12/2020, 4:11 PM  [facility] MAIN [facility] SERVICES [address] [place], [place], [address] Phone: [phone]   Fax:  [phone]  Name: Kristin Solis MRN: [id number] Date of Birth: October 20, 1955

## 2020-06-13 DIAGNOSIS — G4733 Obstructive sleep apnea (adult) (pediatric): Secondary | ICD-10-CM | POA: Diagnosis not present

## 2020-06-17 DIAGNOSIS — H9313 Tinnitus, bilateral: Secondary | ICD-10-CM | POA: Diagnosis not present

## 2020-06-17 DIAGNOSIS — H903 Sensorineural hearing loss, bilateral: Secondary | ICD-10-CM | POA: Diagnosis not present

## 2020-06-17 DIAGNOSIS — H6123 Impacted cerumen, bilateral: Secondary | ICD-10-CM | POA: Diagnosis not present

## 2020-06-19 ENCOUNTER — Ambulatory Visit: Payer: Medicaid Other | Attending: Nurse Practitioner | Admitting: Occupational Therapy

## 2020-06-19 ENCOUNTER — Encounter: Payer: Self-pay | Admitting: Occupational Therapy

## 2020-06-19 ENCOUNTER — Other Ambulatory Visit: Payer: Self-pay

## 2020-06-19 DIAGNOSIS — M6281 Muscle weakness (generalized): Secondary | ICD-10-CM | POA: Insufficient documentation

## 2020-06-19 DIAGNOSIS — R278 Other lack of coordination: Secondary | ICD-10-CM | POA: Insufficient documentation

## 2020-06-19 DIAGNOSIS — I69351 Hemiplegia and hemiparesis following cerebral infarction affecting right dominant side: Secondary | ICD-10-CM | POA: Diagnosis present

## 2020-06-20 NOTE — Addendum Note (Signed)
Addended by: Derrek Gu T on: 06/20/2020 02:27 PM   Modules accepted: Orders

## 2020-06-20 NOTE — Therapy (Signed)
[facility] MAIN [facility] SERVICES [address] [place], [place], [address] Phone: [phone]   Fax:  [phone]  Occupational Therapy Treatment/Recertification   Patient Details  Name: Kristin Solis MRN: [id number] Date of Birth: 1955-09-09 Referring Provider (OT): Aura Dials   Encounter Date: 06/19/2020   OT End of Session - 06/20/20 1409    Visit Number 8    Number of Visits 16    Date for OT Re-Evaluation 06/21/20    Authorization Type Medicaid    OT Start Time 1350    OT Stop Time 1435    OT Time Calculation (min) 45 min    Activity Tolerance Patient tolerated treatment well    Behavior During Therapy [facility] for tasks assessed/performed           Past Medical History:  Diagnosis Date  . Asthma   . Hypertension   . Stroke [facility])     Past Surgical History:  Procedure Laterality Date  . ABDOMINAL HYSTERECTOMY  2005   total  . IR CT HEAD LTD  04/18/2018  . IR INTRAVSC STENT CERV CAROTID W/O EMB-PROT MOD SED INC ANGIO  04/18/2018  . IR PERCUTANEOUS ART THROMBECTOMY/INFUSION INTRACRANIAL INC DIAG ANGIO  04/18/2018  . OOPHORECTOMY    . RADIOLOGY WITH ANESTHESIA N/A 04/17/2018   Procedure: RADIOLOGY WITH ANESTHESIA;  Surgeon: Julieanne Cotton, MD;  Location: [facility] OR;  Service: Radiology;  Laterality: N/A;    There were no vitals filed for this visit.   Subjective Assessment - 06/20/20 1407    Subjective  Pt reports she hasn't had any pain in the arm this past week, not doing any crafts in this time frame.  She did engage in cooking at home for Sunday dinner and completed it by herself.    Pertinent History Patient is a 65 y.o. female with history of stroke,  admitted to ED on 04/17/18 with dysphagia and right sided weakness s/p left MCA CVA, extubated on 04/19/18 and discharged on 04/23/18 to private residence    Patient Stated Goals Be able to move my arm up and down and use it like normal, get rid of the pain.    Currently in Pain?  No/denies    Pain Score 0-No pain              [facility] OT Assessment - 06/19/20 1403      Coordination   Right 9 Hole Peg Test 31    Left 9 Hole Peg Test 30      AROM   Right Shoulder Flexion 116 Degrees    Right Shoulder ABduction 112 Degrees    Right Wrist Extension 60 Degrees    Right Wrist Flexion 75 Degrees    Right Wrist Radial Deviation 21 Degrees    Right Wrist Ulnar Deviation 30 Degrees    Left Wrist Extension 64 Degrees    Left Wrist Flexion 75 Degrees      Strength   Overall Strength Comments R 3-/5, left 4/5      Hand Function   Right Hand Grip (lbs) 40    Right Hand Lateral Pinch 12 lbs    Right Hand 3 Point Pinch 14 lbs    Left Hand Grip (lbs) 61    Left Hand Lateral Pinch 14 lbs    Left 3 point pinch 14 lbs            Pt seen for reassessment of skills as outlined in above flowsheet, please refer for details.  ADL reassessment performed, patient in independent with basic self care tasks.  She has been able to clean her room, vacuum her bedroom without rest breaks, cook light meals and more recently a more involved Sunday dinner meal using stove and oven.  She still does not drive, her husband takes her to all appointments.. Review and update of all goals performed  FOTO administered and went from 60 to 61 (goal of 64)  Grip strength did not change during the last few weeks, performed grip strengthening tasks with right hand with use of resistive hand gripper for 4 sets of 20 reps, occasional cues for proper hand placement on gripper for optimal performance.      Response to tx: Pt has continued to progress well, pain in right UE has steadily decreased over time, has occasional flare ups which appears to be worse when she performs craft projects without rest breaks and for longer periods of time.  No pain in the last couple of treatment sessions, will attempt to reincorporate crafts with structure to perform with breaks and decrease duration time of  activity in one sitting.  She demonstrates improvement in IADL tasks such as vacuuming and performing some select cooking skills.  She has improved her FOTO score to 61.  She continues to benefit from skilled OT services to maximize safety and independence in necessary daily tasks.              OT Education - 06/20/20 1408    Education Details HEP, progress, goals, POC    Person(s) Educated Patient    Methods Explanation;Demonstration    Comprehension Verbalized understanding;Returned demonstration               OT Long Term Goals - 06/19/20 1357      OT LONG TERM GOAL #1   Title Patient will complete home exercise program with modified independence.    Baseline has some exercises but will need updates to current HEP, continues to change and update program    Time 8    Period Weeks    Status On-going    Target Date 08/16/20      OT LONG TERM GOAL #2   Title Pt will demonstrate improvement in FOTO score to 64 or greater to show a clinically relevant change to improve right hand function in daily tasks.    Baseline Score of 60 at eval, recert:  61    Time 8    Period Weeks    Status On-going    Target Date 08/16/20      OT LONG TERM GOAL #3   Title Pt will improve grip by 5# to assist with opening jars and containers    Baseline still has difficulty with managing containers, husband assists, recert:  grip unchanged    Time 4    Period Weeks    Status On-going    Target Date 08/16/20      OT LONG TERM GOAL #4   Title Pt will decrease shoulder pain on the right to a 2 or less with reaching tasks involving shoulder flexion to place and remove items from a closet shelf.    Baseline pain increased to 6/10 at times with use of RUE, no pain at rest, Recert:  no pain today, 3/10 some days    Time 8    Period Weeks    Status On-going    Target Date 08/16/20      OT LONG TERM GOAL #5   Title Pt  will improve R shoulder flexion by 10 degrees to use right UE during folding  laundry, especially with larger items such as a sheet.    Baseline shoulder flexion 116    Time 8    Period Weeks    Status On-going    Target Date 08/16/20      OT LONG TERM GOAL #6   Title Pt will improve ER to be able to reach and put on seat belt with pain less than 2/10.    Baseline pain with ER at eval, recert pain 3/10    Time 4    Period Weeks    Status On-going    Target Date 08/16/20      OT LONG TERM GOAL #7   Title Pt to be able to vacuum 1-2 rooms with light weight vacuum and no rest breaks.    Baseline can only do 1 room at a time and requires rest breaks    Time 8    Period Weeks    Status Achieved                 Plan - 06/20/20 1409    Clinical Impression Statement Pt has continued to progress well, pain in right UE has steadily decreased over time, has occasional flare ups which appears to be worse when she performs craft projects without rest breaks and for longer periods of time.  No pain in the last couple of treatment sessions, will attempt to reincorporate crafts with structure to perform with breaks and decrease duration time of activity in one sitting.  She demonstrates improvement in IADL tasks such as vacuuming and performing some select cooking skills.  She has improved her FOTO score to 61.  She continues to benefit from skilled OT services to maximize safety and independence in necessary daily tasks.    OT Occupational Profile and History Detailed Assessment- Review of Records and additional review of physical, cognitive, psychosocial history related to current functional performance    Occupational performance deficits (Please refer to evaluation for details): ADL's;IADL's;Work;Leisure    Body Structure / Function / Physical Skills ADL;Coordination;GMC;UE functional use;Balance;Decreased knowledge of use of DME;Flexibility;IADL;Dexterity;FMC;Proprioception;Strength;Edema;ROM    Cognitive Skills Memory    Psychosocial Skills Environmental   Adaptations;Habits;Routines and Behaviors    Rehab Potential Good    Clinical Decision Making Limited treatment options, no task modification necessary    Comorbidities Affecting Occupational Performance: May have comorbidities impacting occupational performance    Modification or Assistance to Complete Evaluation  No modification of tasks or assist necessary to complete eval    OT Frequency 2x / week    OT Duration 8 weeks    OT Treatment/Interventions Self-care/ADL training;Cryotherapy;Therapeutic exercise;DME and/or AE instruction;Functional Mobility Training;Cognitive remediation/compensation;Balance training;Electrical Stimulation;Neuromuscular education;Manual Therapy;Splinting;Moist Heat;Contrast Bath;Therapeutic activities;Patient/family education;Passive range of motion    Consulted and Agree with Plan of Care Patient           Patient will benefit from skilled therapeutic intervention in order to improve the following deficits and impairments:   Body Structure / Function / Physical Skills: ADL,Coordination,GMC,UE functional use,Balance,Decreased knowledge of use of DME,Flexibility,IADL,Dexterity,FMC,Proprioception,Strength,Edema,ROM Cognitive Skills: Memory Psychosocial Skills: Environmental  Adaptations,Habits,Routines and Behaviors   Visit Diagnosis: Muscle weakness (generalized)  Other lack of coordination  Hemiplegia and hemiparesis following cerebral infarction affecting right dominant side [facility])    Problem List Patient Active Problem List   Diagnosis Date Noted  . Tinnitus of both ears 04/11/2020  . Insomnia 04/11/2020  . Stress incontinence 10/09/2019  .  Morbid obesity (HCC) 08/15/2019  . Mixed hyperlipidemia 09/28/2018  . History of stroke 04/17/2018  . IFG (impaired fasting glucose) 03/28/2018  . Essential hypertension, benign 01/15/2017  . Asthma 01/15/2017  . Obstructive sleep apnea 10/18/2013    Kerrie Buffalo, OTR/L, CLT Leomia Blake 06/20/2020, 2:25  PM  [facility] MAIN [facility] SERVICES [address] [place], [place], [address] Phone: [phone]   Fax:  [phone]  Name: Kristin Solis MRN: [id number] Date of Birth: 01-Dec-1955

## 2020-06-21 ENCOUNTER — Other Ambulatory Visit: Payer: Self-pay

## 2020-06-21 ENCOUNTER — Other Ambulatory Visit: Payer: Medicaid Other

## 2020-06-21 DIAGNOSIS — E870 Hyperosmolality and hypernatremia: Secondary | ICD-10-CM | POA: Diagnosis not present

## 2020-06-22 LAB — BASIC METABOLIC PANEL
BUN/Creatinine Ratio: 14 (ref 12–28)
BUN: 14 mg/dL (ref 8–27)
CO2: 24 mmol/L (ref 20–29)
Calcium: 9.6 mg/dL (ref 8.7–10.3)
Chloride: 104 mmol/L (ref 96–106)
Creatinine, Ser: 0.98 mg/dL (ref 0.57–1.00)
Glucose: 92 mg/dL (ref 65–99)
Potassium: 4.3 mmol/L (ref 3.5–5.2)
Sodium: 143 mmol/L (ref 134–144)
eGFR: 65 mL/min/{1.73_m2} (ref >59)

## 2020-06-22 NOTE — Progress Notes (Signed)
Contacted via MyChart   Good morning Kristin Solis -- labs are much improved.  Sodium now normal level.  Continue to get good fluid intake and lower sodium.  Great job and happy weekend!!  Also Happy Mother's Day!! Keep being amazing!!  Thank you for allowing me to participate in your care.  I appreciate you. Kindest regards, Jackson Coffield

## 2020-06-23 LAB — OSMOLALITY, URINE: Osmolality, Ur: 472 mOsmol/kg

## 2020-06-25 ENCOUNTER — Telehealth: Payer: Self-pay | Admitting: General Practice

## 2020-06-25 ENCOUNTER — Ambulatory Visit: Payer: Self-pay | Admitting: General Practice

## 2020-06-25 DIAGNOSIS — E782 Mixed hyperlipidemia: Secondary | ICD-10-CM

## 2020-06-25 DIAGNOSIS — Z8673 Personal history of transient ischemic attack (TIA), and cerebral infarction without residual deficits: Secondary | ICD-10-CM

## 2020-06-25 DIAGNOSIS — I10 Essential (primary) hypertension: Secondary | ICD-10-CM

## 2020-06-25 DIAGNOSIS — J4540 Moderate persistent asthma, uncomplicated: Secondary | ICD-10-CM

## 2020-06-25 NOTE — Chronic Care Management (AMB) (Signed)
Care Management    RN Visit Note  06/25/2020 Name: Kristin Solis MRN: [id number] DOB: May 04, 1955  Subjective: Kristin Solis is a 65 y.o. year old female who is a primary care patient of [name], [name], NP. The care management team was consulted for assistance with disease management and care coordination needs.    Engaged with patient by telephone for follow up visit in response to provider referral for case management and/or care coordination services.   Consent to Services:   Ms. Modesitt was given information about Care Management services today including:  1. Care Management services includes personalized support from designated clinical staff supervised by her physician, including individualized plan of care and coordination with other care providers 2. 24/7 contact phone numbers for assistance for urgent and routine care needs. 3. The patient may stop case management services at any time by phone call to the office staff.  Patient agreed to services and consent obtained.   Assessment: Review of patient past medical history, allergies, medications, health status, including review of consultants reports, laboratory and other test data, was performed as part of comprehensive evaluation and provision of chronic care management services.   SDOH (Social Determinants of Health) assessments and interventions performed:    Care Plan  No Known Allergies  Outpatient Encounter Medications as of 06/25/2020  Medication Sig Note  . albuterol (VENTOLIN HFA) 108 (90 Base) MCG/ACT inhaler Inhale 2 puffs into the lungs every 6 (six) hours as needed for wheezing or shortness of breath. 04/04/2019: PRN; hasn't needed in a while   . aspirin EC 81 MG EC tablet Take 1 tablet (81 mg total) by mouth daily.   [name] atorvastatin (LIPITOR) 40 MG tablet Take 1 tablet (40 mg total) by mouth daily at 6 PM.   . Blood Pressure Monitoring (OMRON WRIST BP MONITOR) DEVI To check blood pressure once daily due to past  stroke.   . fluticasone (FLONASE) 50 MCG/ACT nasal spray Place 2 sprays into both nostrils daily.   . Fluticasone-Salmeterol (ADVAIR DISKUS) 250-50 MCG/DOSE AEPB Inhale 1 puff into the lungs 2 (two) times daily.   [name] lisinopril (ZESTRIL) 5 MG tablet Take 1 tablet (5 mg total) by mouth daily.    No facility-administered encounter medications on file as of 06/25/2020.    Patient Active Problem List   Diagnosis Date Noted  . Tinnitus of both ears 04/11/2020  . Insomnia 04/11/2020  . Stress incontinence 10/09/2019  . Morbid obesity ([place]) 08/15/2019  . Mixed hyperlipidemia 09/28/2018  . History of stroke 04/17/2018  . IFG (impaired fasting glucose) 03/28/2018  . Essential hypertension, benign 01/15/2017  . Asthma 01/15/2017  . Obstructive sleep apnea 10/18/2013    Conditions to be addressed/monitored: HTN, HLD, COPD and history stroke  Care Plan : RNCM: Asthma (Adult)  Updates made by Vanita Ingles since 06/25/2020 12:00 AM    Problem: RNCM: Disease Progression (Asthma)   Priority: Medium    Long-Range Goal: RNCM: COPD/Asthma Disease Progression Prevented or Minimized   Priority: Medium  Note:   Current Barriers:  [name] Knowledge deficits related to basic understanding of COPD disease process . Knowledge deficits related to basic COPD self care/management . Knowledge deficit related to importance of energy conservation . Limited Social Support . Unable to independently manage Asthma during exacerbation periods  . Lacks social connections . Does not contact provider office for questions/concerns  Case Manager Clinical Goal(s):  Over the next 120 days patient will report using inhalers as prescribed including  rinsing mouth after use  Over the next 120 days patient will report utilizing pursed lip breathing for shortness of breath  Over the next 120 days, patient will be able to verbalize understanding of COPD action plan and when to seek appropriate levels of medical care  Over  the next 120 days, patient will engage in lite exercise as tolerated to build/regain stamina and strength and reduce shortness of breath through activity tolerance  Over the next 120 days, patient will verbalize basic understanding of COPD disease process and self care activities  Over the next 120 days, patient will not be hospitalized for COPD exacerbation  Interventions:   Provided patient with basic written and verbal COPD education on self care/management/and exacerbation prevention   Provided patient with COPD action plan and reinforced importance of daily self assessment. 06-25-2020: States that she stays in most of the time and does not go out. No issues with pollen or other triggers with her breathing at this time.   Provided instruction about proper use of medications used for management of COPD including inhalers  Advised patient to self assesses COPD action plan zone and make appointment with provider if in the yellow zone for 48 hours without improvement.  Provided patient with education about the role of exercise in the management of COPD. 06-25-2020: The patient is working with OT for strengthening and is happy because this is helping her. Continues to work with the OT and doing well.   Advised patient to engage in light exercise as tolerated 3-5 days a week  Provided education about and advised patient to utilize infection prevention strategies to reduce risk of respiratory infection  Patient Goals/Self-Care Activities:  . - activity or exercise based on tolerance encouraged . - barriers to medication adherence identified . - emotional support provided . - medication-adherence assessment completed . - participation in asthma disease management program encouraged . - screen for functional limitations reviewed . - self-awareness of symptom triggers encouraged . - symptom control monitored . - symptom log or asthma action plan reviewed . - symptom triggers identified Follow  Up Plan: Telephone follow up appointment with care management team member scheduled for:  09-10-2020 at 1 pm   Care Plan : RNCM: Management of Stroke (Adult)  Updates made by Vanita Ingles since 06/25/2020 12:00 AM    Problem: RNCM: Emotional Adjustment to Disease (Stroke)   Priority: Medium    Long-Range Goal: RNCM: Management of Stroke   Priority: Medium  Note:   Current Barriers:  [name] Knowledge Deficits related to OT therapy restarting for patient with past history of a stroke . Care Coordination needs related to OT in a patient with history of a stroke  . Chronic Disease Management support and education needs related to deficits from past stroke  . Lacks caregiver support.  . Unable to independently manage post stroke care  . Does not contact provider office for questions/concerns  Nurse Case Manager Clinical Goal(s):  [name] Over the next 120 days, patient will verbalize understanding of plan for stroke prevention . Over the next 120 days, patient will work with RNCM, pcp and CCM team to address needs related to residual deficits related to past stroke . Over the next 120 days, patient will attend all scheduled medical appointments: 11-22-2020 . Over the next 120 days, patient will demonstrate improved adherence to prescribed treatment plan for stroke prevention as evidenced bycalling office for changes, compliance with medications and heart healthy diet and following the recommendations of the  care team.   Interventions:  . 1:1 collaboration with [name], NP regarding development and update of comprehensive plan of care as evidenced by provider attestation and co-signature . Inter-disciplinary care team collaboration (see longitudinal plan of care) . Evaluation of current treatment plan related to history of stroke  and patient's adherence to plan as established by provider. . Advised patient to call the office if she has not heard back from OT- will discuss referral with the pcp.  06-25-2020: The patient is working with OT and says this is helping her a lot.  . Provided education to patient re: monitor for changes in condition and to call the provider for new concerns or questions.  . Reviewed medications with patient and discussed compliance . Collaborated with pcp regarding the patient needing a new referral for OT because they called her in November for the referral last put in and she needs a new referral for the new year.  She would like to work with Any Tomasita Morrow as she did before. 06-25-2020: The patient is working with Amy and is happy to be back with her . Discussed plans with patient for ongoing care management follow up and provided patient with direct contact information for care management team . Reviewed scheduled/upcoming provider appointments including: 11-22-2020  Patient Goals/Self-Care Activities Over the next 120 days, patient will:  - Patient will self administer medications as prescribed Patient will attend all scheduled provider appointments Patient will call pharmacy for medication refills Patient will call provider office for new concerns or questions Patient will work with BSW to address care coordination needs and will continue to work with the clinical team to address health care and disease management related needs.    - decision-making supported - depression screen reviewed - positive reinforcement provided - problem-solving facilitated - relaxation techniques promoted - self-care encouraged - self-reflection promoted - verbalization of feelings encouraged Follow Up Plan: Telephone follow up appointment with care management team member scheduled for: 09-10-2020 at 1 pm       Care Plan : RNCM: Hypertension (Adult)  Updates made by Vanita Ingles since 06/25/2020 12:00 AM    Problem: RNCM: Hypertension (Hypertension)   Priority: Medium    Goal: RNCM: Hypertension Monitored   Priority: Medium  Note:   Objective:  . Last practice  recorded BP readings:  . BP Readings from Last 3 Encounters: .  04/11/20 . 138/80 .  11/27/19 . 140/88 .  10/09/19 . 122/74 .    [name] Most recent eGFR/CrCl: No results found for: EGFR  No components found for: CRCL Current Barriers:  [name] Knowledge Deficits related to basic understanding of hypertension pathophysiology and self care management . Knowledge Deficits related to understanding of medications prescribed for management of hypertension . Limited Social Support . Unable to independently manage HTN . Does not contact provider office for questions/concerns Case Manager Clinical Goal(s):  [name] Over the next 120 days, patient will verbalize understanding of plan for hypertension management . Over the next 120 days, patient will attend all scheduled medical appointments: 11-22-2020 . Over the next 120 days, patient will demonstrate improved adherence to prescribed treatment plan for hypertension as evidenced by taking all medications as prescribed, monitoring and recording blood pressure as directed, adhering to low sodium/DASH diet . Over the next 120 days, patient will demonstrate improved health management independence as evidenced by checking blood pressure as directed and notifying PCP if SBP>160 or DBP > 90, taking all medications as prescribe, and adhering to  a low sodium diet as discussed. Interventions:  . Evaluation of current treatment plan related to hypertension self management and patient's adherence to plan as established by provider. . Provided education to patient re: stroke prevention, s/s of heart attack and stroke, DASH diet, complications of uncontrolled blood pressure . Reviewed medications with patient and discussed importance of compliance. 06-25-2020: endorses compliance  . Discussed plans with patient for ongoing care management follow up and provided patient with direct contact information for care management team . Advised patient, providing education and rationale, to  monitor blood pressure daily and record, calling PCP for findings outside established parameters. The patient states her blood pressures are stable  . Reviewed scheduled/upcoming provider appointments including: 11-22-2020  Patient Goals/Self-Care Activities . Over the next 120 days, patient will:  - Calls provider office for new concerns, questions, or BP outside discussed parameters Checks BP and records as discussed Follows a low sodium diet/DASH diet - blood pressure trends reviewed - depression screen reviewed - home or ambulatory blood pressure monitoring encouraged Follow Up Plan: Telephone follow up appointment with care management team member scheduled for: 09-10-2020 at 1 pm   Care Plan : RNCM: HLD Management  Updates made by Vanita Ingles since 06/25/2020 12:00 AM    Problem: HLD: Health Promotion or Disease Self-Management (General Plan of Care)   Priority: Medium    Long-Range Goal: RNCM: HLD: Self-Management Plan Developed   Priority: Medium  Note:   Current Barriers:  . Poorly controlled hyperlipidemia, complicated by history of stroke in 2020, HTN . Current antihyperlipidemic regimen: Lipitor 40 mg daily . Most recent lipid panel:  . Lab Results .  Component . Value . Date .   [name] CHOL . 141 . 04/11/2020 .   [name] HDL . 72 . 04/11/2020 .   [name] Dana . 58 . 04/11/2020 .   [name] TRIG . 52 . 04/11/2020 .   [name] CHOLHDL . 2.8 . 04/18/2018 .    [name] ASCVD risk enhancing conditions:  History of Stroke, HTN, Asthma . Unable to independently manage HLD . Does not contact provider office for questions/concerns  RN Care Manager Clinical Goal(s):  [name] Over the next 120 days, patient will work with Consulting civil engineer, providers, and care team towards execution of optimized self-health management plan . Over the next 120 days, patient will verbalize understanding of plan for management of HLD . Over the next 120 days, patient will attend all scheduled medical appointments:  11-22-2020  Interventions: [name] Medication review performed; medication list updated in electronic medical record.  Bertram Savin care team collaboration (see longitudinal plan of care) . Referred to pharmacy team for assistance with HLD medication management . Evaluation of current treatment plan related to HLD and patient's adherence to plan as established by provider. . Advised patient to call the office for any changes in condition or questions . Provided education to patient re: monitoring dietary intake and following heart healthy diet . Reviewed medications with patient and discussed compliance. 06-25-2020: Review and the patient is compliant . Discussed plans with patient for ongoing care management follow up and provided patient with direct contact information for care management team . Reviewed scheduled/upcoming provider appointments including: 11-22-2020   Patient Goals/Self-Care Activities: . Over the next 120 days, patient will:   - call for medicine refill 2 or 3 days before it runs out - call if I am sick and can't take my medicine - keep a list of all the medicines I take;  vitamins and herbals too - learn to read medicine labels - use a pillbox to sort medicine - use an alarm clock or phone to remind me to take my medicine - drink 6 to 8 glasses of water each day - eat 3 to 5 servings of fruits and vegetables each day - fill half the plate with nonstarchy vegetables - limit fast food meals to no more than 1 per week - manage portion size - prepare main meal at home 3 to 5 days each week - read food labels for fat, fiber, carbohydrates and portion size - set goal weight - be open to making changes - I can manage, know and watch for signs of a heart attack - if I have chest pain, call for help - learn about small changes that will make a big difference - learn my personal risk factors - barriers to meeting goals identified - change-talk evoked - choices  provided - collaboration with team encouraged - decision-making supported - health risks reviewed - problem-solving facilitated - questions answered - readiness for change evaluated - reassurance provided - resources needed to meet goals identified - self-reflection promoted - self-reliance encouraged - verbalization of feelings encouraged   Follow Up Plan: Telephone follow up appointment with care management team member scheduled for: 09-10-2020 at 1 pm       Plan: Telephone follow up appointment with care management team member scheduled for:  09-10-2020 at 1 pm  Noreene Larsson RN, MSN, [facility] Mobile: [phone]

## 2020-06-25 NOTE — Patient Instructions (Signed)
Visit Information  PATIENT GOALS: Goals Addressed            This Visit's Progress   . COMPLETED: RNCM: Learn More About My Health       Timeframe:  Short-Term Goal Priority:  Medium Start Date:                             Expected End Date:        04-23-2021                Follow Up Date 06-25-2020   - tell my story and reason for my visit - make a list of questions - ask questions - repeat what I heard to make sure I understand - bring a list of my medicines to the visit - speak up when I don't understand    Why is this important?    The best way to learn about your health and care is by talking to the doctor and nurse.   They will answer your questions and give you information in the way that you like best.          Patient Care Plan: RNCM: Asthma (Adult)    Problem Identified: RNCM: Disease Progression (Asthma)   Priority: Medium    Long-Range Goal: RNCM: COPD/Asthma Disease Progression Prevented or Minimized   Priority: Medium  Note:   Current Barriers:  [name] Knowledge deficits related to basic understanding of COPD disease process . Knowledge deficits related to basic COPD self care/management . Knowledge deficit related to importance of energy conservation . Limited Social Support . Unable to independently manage Asthma during exacerbation periods  . Lacks social connections . Does not contact provider office for questions/concerns  Case Manager Clinical Goal(s):  Over the next 120 days patient will report using inhalers as prescribed including rinsing mouth after use  Over the next 120 days patient will report utilizing pursed lip breathing for shortness of breath  Over the next 120 days, patient will be able to verbalize understanding of COPD action plan and when to seek appropriate levels of medical care  Over the next 120 days, patient will engage in lite exercise as tolerated to build/regain stamina and strength and reduce shortness of breath through  activity tolerance  Over the next 120 days, patient will verbalize basic understanding of COPD disease process and self care activities  Over the next 120 days, patient will not be hospitalized for COPD exacerbation  Interventions:   Provided patient with basic written and verbal COPD education on self care/management/and exacerbation prevention   Provided patient with COPD action plan and reinforced importance of daily self assessment. 06-25-2020: States that she stays in most of the time and does not go out. No issues with pollen or other triggers with her breathing at this time.   Provided instruction about proper use of medications used for management of COPD including inhalers  Advised patient to self assesses COPD action plan zone and make appointment with provider if in the yellow zone for 48 hours without improvement.  Provided patient with education about the role of exercise in the management of COPD. 06-25-2020: The patient is working with OT for strengthening and is happy because this is helping her. Continues to work with the OT and doing well.   Advised patient to engage in light exercise as tolerated 3-5 days a week  Provided education about and advised patient to utilize infection prevention strategies  to reduce risk of respiratory infection  Patient Goals/Self-Care Activities:  . - activity or exercise based on tolerance encouraged . - barriers to medication adherence identified . - emotional support provided . - medication-adherence assessment completed . - participation in asthma disease management program encouraged . - screen for functional limitations reviewed . - self-awareness of symptom triggers encouraged . - symptom control monitored . - symptom log or asthma action plan reviewed . - symptom triggers identified Follow Up Plan: Telephone follow up appointment with care management team member scheduled for:  09-10-2020 at 1 pm   Task: RNCM: Monitor and Manage  Adherence to Asthma Therapy   Note:   Care Management Activities:    - activity or exercise based on tolerance encouraged - barriers to medication adherence identified - emotional support provided - medication-adherence assessment completed - participation in asthma disease management program encouraged - screen for functional limitations reviewed - self-awareness of symptom triggers encouraged - symptom control monitored - symptom log or asthma action plan reviewed - symptom triggers identified       Patient Care Plan: RNCM: Management of Stroke (Adult)    Problem Identified: RNCM: Emotional Adjustment to Disease (Stroke)   Priority: Medium    Long-Range Goal: RNCM: Management of Stroke   Priority: Medium  Note:   Current Barriers:  [name] Knowledge Deficits related to OT therapy restarting for patient with past history of a stroke . Care Coordination needs related to OT in a patient with history of a stroke  . Chronic Disease Management support and education needs related to deficits from past stroke  . Lacks caregiver support.  . Unable to independently manage post stroke care  . Does not contact provider office for questions/concerns  Nurse Case Manager Clinical Goal(s):  [name] Over the next 120 days, patient will verbalize understanding of plan for stroke prevention . Over the next 120 days, patient will work with RNCM, pcp and CCM team to address needs related to residual deficits related to past stroke . Over the next 120 days, patient will attend all scheduled medical appointments: 11-22-2020 . Over the next 120 days, patient will demonstrate improved adherence to prescribed treatment plan for stroke prevention as evidenced bycalling office for changes, compliance with medications and heart healthy diet and following the recommendations of the care team.   Interventions:  . 1:1 collaboration with [name], NP regarding development and update of comprehensive plan of  care as evidenced by provider attestation and co-signature . Inter-disciplinary care team collaboration (see longitudinal plan of care) . Evaluation of current treatment plan related to history of stroke  and patient's adherence to plan as established by provider. . Advised patient to call the office if she has not heard back from OT- will discuss referral with the pcp. 06-25-2020: The patient is working with OT and says this is helping her a lot.  . Provided education to patient re: monitor for changes in condition and to call the provider for new concerns or questions.  . Reviewed medications with patient and discussed compliance . Collaborated with pcp regarding the patient needing a new referral for OT because they called her in November for the referral last put in and she needs a new referral for the new year.  She would like to work with Any Tomasita Morrow as she did before. 06-25-2020: The patient is working with Amy and is happy to be back with her . Discussed plans with patient for ongoing care management follow up and  provided patient with direct contact information for care management team . Reviewed scheduled/upcoming provider appointments including: 11-22-2020  Patient Goals/Self-Care Activities Over the next 120 days, patient will:  - Patient will self administer medications as prescribed Patient will attend all scheduled provider appointments Patient will call pharmacy for medication refills Patient will call provider office for new concerns or questions Patient will work with BSW to address care coordination needs and will continue to work with the clinical team to address health care and disease management related needs.    - decision-making supported - depression screen reviewed - positive reinforcement provided - problem-solving facilitated - relaxation techniques promoted - self-care encouraged - self-reflection promoted - verbalization of feelings encouraged Follow Up Plan:  Telephone follow up appointment with care management team member scheduled for: 09-10-2020 at 1 pm       Task: RNCM: Support Psychosocial Response to Stroke   Note:   Care Management Activities:    - decision-making supported - depression screen reviewed - positive reinforcement provided - problem-solving facilitated - relaxation techniques promoted - self-care encouraged - self-reflection promoted - verbalization of feelings encouraged       Patient Care Plan: RNCM: Hypertension (Adult)    Problem Identified: RNCM: Hypertension (Hypertension)   Priority: Medium    Goal: RNCM: Hypertension Monitored   Priority: Medium  Note:   Objective:  . Last practice recorded BP readings:  . BP Readings from Last 3 Encounters: .  04/11/20 . 138/80 .  11/27/19 . 140/88 .  10/09/19 . 122/74 .    [name] Most recent eGFR/CrCl: No results found for: EGFR  No components found for: CRCL Current Barriers:  [name] Knowledge Deficits related to basic understanding of hypertension pathophysiology and self care management . Knowledge Deficits related to understanding of medications prescribed for management of hypertension . Limited Social Support . Unable to independently manage HTN . Does not contact provider office for questions/concerns Case Manager Clinical Goal(s):  [name] Over the next 120 days, patient will verbalize understanding of plan for hypertension management . Over the next 120 days, patient will attend all scheduled medical appointments: 11-22-2020 . Over the next 120 days, patient will demonstrate improved adherence to prescribed treatment plan for hypertension as evidenced by taking all medications as prescribed, monitoring and recording blood pressure as directed, adhering to low sodium/DASH diet . Over the next 120 days, patient will demonstrate improved health management independence as evidenced by checking blood pressure as directed and notifying PCP if SBP>160 or DBP > 90, taking all  medications as prescribe, and adhering to a low sodium diet as discussed. Interventions:  . Evaluation of current treatment plan related to hypertension self management and patient's adherence to plan as established by provider. . Provided education to patient re: stroke prevention, s/s of heart attack and stroke, DASH diet, complications of uncontrolled blood pressure . Reviewed medications with patient and discussed importance of compliance. 06-25-2020: endorses compliance  . Discussed plans with patient for ongoing care management follow up and provided patient with direct contact information for care management team . Advised patient, providing education and rationale, to monitor blood pressure daily and record, calling PCP for findings outside established parameters. The patient states her blood pressures are stable  . Reviewed scheduled/upcoming provider appointments including: 11-22-2020  Patient Goals/Self-Care Activities . Over the next 120 days, patient will:  - Calls provider office for new concerns, questions, or BP outside discussed parameters Checks BP and records as discussed Follows a low sodium diet/DASH diet -  blood pressure trends reviewed - depression screen reviewed - home or ambulatory blood pressure monitoring encouraged Follow Up Plan: Telephone follow up appointment with care management team member scheduled for: 09-10-2020 at 1 pm   Task: RNCM: Identify and Monitor Blood Pressure Elevation   Note:   Care Management Activities:    - blood pressure trends reviewed - depression screen reviewed - home or ambulatory blood pressure monitoring encouraged       Patient Care Plan: RNCM: HLD Management    Problem Identified: HLD: Health Promotion or Disease Self-Management (General Plan of Care)   Priority: Medium    Long-Range Goal: RNCM: HLD: Self-Management Plan Developed   Priority: Medium  Note:   Current Barriers:  . Poorly controlled hyperlipidemia,  complicated by history of stroke in 2020, HTN . Current antihyperlipidemic regimen: Lipitor 40 mg daily . Most recent lipid panel:  . Lab Results .  Component . Value . Date .   [name] CHOL . 141 . 04/11/2020 .   [name] HDL . 72 . 04/11/2020 .   [name] [place] . 58 . 04/11/2020 .   [name] TRIG . 52 . 04/11/2020 .   [name] CHOLHDL . 2.8 . 04/18/2018 .    [name] ASCVD risk enhancing conditions:  History of Stroke, HTN, Asthma . Unable to independently manage HLD . Does not contact provider office for questions/concerns  RN Care Manager Clinical Goal(s):  [name] Over the next 120 days, patient will work with Consulting civil engineer, providers, and care team towards execution of optimized self-health management plan . Over the next 120 days, patient will verbalize understanding of plan for management of HLD . Over the next 120 days, patient will attend all scheduled medical appointments: 11-22-2020  Interventions: [name] Medication review performed; medication list updated in electronic medical record.  Bertram Savin care team collaboration (see longitudinal plan of care) . Referred to pharmacy team for assistance with HLD medication management . Evaluation of current treatment plan related to HLD and patient's adherence to plan as established by provider. . Advised patient to call the office for any changes in condition or questions . Provided education to patient re: monitoring dietary intake and following heart healthy diet . Reviewed medications with patient and discussed compliance. 06-25-2020: Review and the patient is compliant . Discussed plans with patient for ongoing care management follow up and provided patient with direct contact information for care management team . Reviewed scheduled/upcoming provider appointments including: 11-22-2020   Patient Goals/Self-Care Activities: . Over the next 120 days, patient will:   - call for medicine refill 2 or 3 days before it runs out - call if I am sick and can't take my  medicine - keep a list of all the medicines I take; vitamins and herbals too - learn to read medicine labels - use a pillbox to sort medicine - use an alarm clock or phone to remind me to take my medicine - drink 6 to 8 glasses of water each day - eat 3 to 5 servings of fruits and vegetables each day - fill half the plate with nonstarchy vegetables - limit fast food meals to no more than 1 per week - manage portion size - prepare main meal at home 3 to 5 days each week - read food labels for fat, fiber, carbohydrates and portion size - set goal weight - be open to making changes - I can manage, know and watch for signs of a heart attack - if I have chest pain,  call for help - learn about small changes that will make a big difference - learn my personal risk factors - barriers to meeting goals identified - change-talk evoked - choices provided - collaboration with team encouraged - decision-making supported - health risks reviewed - problem-solving facilitated - questions answered - readiness for change evaluated - reassurance provided - resources needed to meet goals identified - self-reflection promoted - self-reliance encouraged - verbalization of feelings encouraged   Follow Up Plan: Telephone follow up appointment with care management team member scheduled for: 09-10-2020 at 1 pm     Task: RNCM: HLD-Mutually Develop and Royce Macadamia Achievement of Patient Goals   Note:   Care Management Activities:    - barriers to meeting goals identified - change-talk evoked - choices provided - collaboration with team encouraged - decision-making supported - health risks reviewed - problem-solving facilitated - questions answered - readiness for change evaluated - reassurance provided - resources needed to meet goals identified - self-reflection promoted - self-reliance encouraged - verbalization of feelings encouraged        Patient verbalizes understanding of instructions  provided today and agrees to view in [place].   Telephone follow up appointment with care management team member scheduled for: 09-10-2020 at 1 pm  Noreene Larsson RN, MSN, [facility] Mobile: [phone]

## 2020-06-26 ENCOUNTER — Ambulatory Visit: Payer: Medicaid Other | Admitting: Occupational Therapy

## 2020-06-26 ENCOUNTER — Other Ambulatory Visit: Payer: Self-pay

## 2020-06-26 DIAGNOSIS — R278 Other lack of coordination: Secondary | ICD-10-CM

## 2020-06-26 DIAGNOSIS — M6281 Muscle weakness (generalized): Secondary | ICD-10-CM

## 2020-06-26 DIAGNOSIS — I69351 Hemiplegia and hemiparesis following cerebral infarction affecting right dominant side: Secondary | ICD-10-CM

## 2020-06-29 ENCOUNTER — Encounter: Payer: Self-pay | Admitting: Occupational Therapy

## 2020-06-29 NOTE — Therapy (Signed)
[facility] MAIN [facility] SERVICES [address] [place], [place], [address] Phone: [phone]   Fax:  [phone]  Occupational Therapy Treatment  Patient Details  Name: Kristin Solis MRN: [id number] Date of Birth: 10-20-1955 Referring Provider (OT): Aura Dials   Encounter Date: 06/26/2020   OT End of Session - 06/29/20 1237    Visit Number 9    Number of Visits 16    Date for OT Re-Evaluation 08/16/20    Authorization Type Medicaid    OT Start Time 1400    OT Stop Time 1445    OT Time Calculation (min) 45 min    Activity Tolerance Patient tolerated treatment well    Behavior During Therapy [facility] for tasks assessed/performed           Past Medical History:  Diagnosis Date  . Asthma   . Hypertension   . Stroke [facility])     Past Surgical History:  Procedure Laterality Date  . ABDOMINAL HYSTERECTOMY  2005   total  . IR CT HEAD LTD  04/18/2018  . IR INTRAVSC STENT CERV CAROTID W/O EMB-PROT MOD SED INC ANGIO  04/18/2018  . IR PERCUTANEOUS ART THROMBECTOMY/INFUSION INTRACRANIAL INC DIAG ANGIO  04/18/2018  . OOPHORECTOMY    . RADIOLOGY WITH ANESTHESIA N/A 04/17/2018   Procedure: RADIOLOGY WITH ANESTHESIA;  Surgeon: Julieanne Cotton, MD;  Location: [facility] OR;  Service: Radiology;  Laterality: N/A;    There were no vitals filed for this visit.   Subjective Assessment - 06/29/20 1236    Subjective  Pt reports she is doing well, no complaints    Pertinent History Patient is a 65 y.o. female with history of stroke,  admitted to ED on 04/17/18 with dysphagia and right sided weakness s/p left MCA CVA, extubated on 04/19/18 and discharged on 04/23/18 to private residence    Patient Stated Goals Be able to move my arm up and down and use it like normal, get rid of the pain.    Currently in Pain? No/denies    Pain Score 0-No pain          UBE in standing, forwards 4 mins, backwards 4 mins, alternating levels of resistance forwards 2.0 to 2.5, backwards  1.0, therapist in constant attendance to ensure grip on the right and to adjust settings.   1# weighted dowel exercise for shoulder flexion/ext, ABD, ADD, chest press, forwards and backwards circles, climbing up dowel in vertical position 2 sets of 12 reps each, cues for proper form and technique.   1# dumbbell for wrist flexion/ext, RD/UD, supination/pronation for 1 set of 10 reps each, therapist demo and cues.    Response to tx:   Pt denies any shoulder pain this date, still feels like it takes increased effort for any motions of the shoulder greater than 90 degrees of flexion or ABD.  Patient responds well to cues with exercises.  Short rest breaks as needed.  Continues to work towards improving ROM and strengthening of right UE for use in daily tasks at home and in the community.  Pt worked on Chiropractor this week with writing out multiple favorite recipes for therapist and brought into the clinic.  All were 100 % legible.                        OT Education - 06/29/20 1237    Education Details HEP, ROM, strength    Person(s) Educated Patient    Methods Explanation;Demonstration  Comprehension Verbalized understanding;Returned demonstration               OT Long Term Goals - 06/19/20 1357      OT LONG TERM GOAL #1   Title Patient will complete home exercise program with modified independence.    Baseline has some exercises but will need updates to current HEP, continues to change and update program    Time 8    Period Weeks    Status On-going    Target Date 08/16/20      OT LONG TERM GOAL #2   Title Pt will demonstrate improvement in FOTO score to 64 or greater to show a clinically relevant change to improve right hand function in daily tasks.    Baseline Score of 60 at eval, recert:  61    Time 8    Period Weeks    Status On-going    Target Date 08/16/20      OT LONG TERM GOAL #3   Title Pt will improve grip by 5# to assist with opening jars and  containers    Baseline still has difficulty with managing containers, husband assists, recert:  grip unchanged    Time 4    Period Weeks    Status On-going    Target Date 08/16/20      OT LONG TERM GOAL #4   Title Pt will decrease shoulder pain on the right to a 2 or less with reaching tasks involving shoulder flexion to place and remove items from a closet shelf.    Baseline pain increased to 6/10 at times with use of RUE, no pain at rest, Recert:  no pain today, 3/10 some days    Time 8    Period Weeks    Status On-going    Target Date 08/16/20      OT LONG TERM GOAL #5   Title Pt will improve R shoulder flexion by 10 degrees to use right UE during folding laundry, especially with larger items such as a sheet.    Baseline shoulder flexion 116    Time 8    Period Weeks    Status On-going    Target Date 08/16/20      OT LONG TERM GOAL #6   Title Pt will improve ER to be able to reach and put on seat belt with pain less than 2/10.    Baseline pain with ER at eval, recert pain 3/10    Time 4    Period Weeks    Status On-going    Target Date 08/16/20      OT LONG TERM GOAL #7   Title Pt to be able to vacuum 1-2 rooms with light weight vacuum and no rest breaks.    Baseline can only do 1 room at a time and requires rest breaks    Time 8    Period Weeks    Status Achieved                 Plan - 06/29/20 1239    Clinical Impression Statement Pt denies any shoulder pain this date, still feels like it takes increased effort for any motions of the shoulder greater than 90 degrees of flexion or ABD.  Patient responds well to cues with exercises.  Short rest breaks as needed.  Continues to work towards improving ROM and strengthening of right UE for use in daily tasks at home and in the community.  Pt worked on handwriting this week with  writing out multiple favorite recipes for therapist and brought into the clinic.  All were 100% legible.    OT Occupational Profile and History  Detailed Assessment- Review of Records and additional review of physical, cognitive, psychosocial history related to current functional performance    Occupational performance deficits (Please refer to evaluation for details): ADL's;IADL's;Work;Leisure    Body Structure / Function / Physical Skills ADL;Coordination;GMC;UE functional use;Balance;Decreased knowledge of use of DME;Flexibility;IADL;Dexterity;FMC;Proprioception;Strength;Edema;ROM    Cognitive Skills Memory    Psychosocial Skills Environmental  Adaptations;Habits;Routines and Behaviors    Rehab Potential Good    Clinical Decision Making Limited treatment options, no task modification necessary    Comorbidities Affecting Occupational Performance: May have comorbidities impacting occupational performance    Modification or Assistance to Complete Evaluation  No modification of tasks or assist necessary to complete eval    OT Frequency 2x / week    OT Duration 8 weeks    OT Treatment/Interventions Self-care/ADL training;Cryotherapy;Therapeutic exercise;DME and/or AE instruction;Functional Mobility Training;Cognitive remediation/compensation;Balance training;Electrical Stimulation;Neuromuscular education;Manual Therapy;Splinting;Moist Heat;Contrast Bath;Therapeutic activities;Patient/family education;Passive range of motion    Consulted and Agree with Plan of Care Patient           Patient will benefit from skilled therapeutic intervention in order to improve the following deficits and impairments:   Body Structure / Function / Physical Skills: ADL,Coordination,GMC,UE functional use,Balance,Decreased knowledge of use of DME,Flexibility,IADL,Dexterity,FMC,Proprioception,Strength,Edema,ROM Cognitive Skills: Memory Psychosocial Skills: Environmental  Adaptations,Habits,Routines and Behaviors   Visit Diagnosis: Muscle weakness (generalized)  Other lack of coordination  Hemiplegia and hemiparesis following cerebral infarction affecting  right dominant side [facility])    Problem List Patient Active Problem List   Diagnosis Date Noted  . Tinnitus of both ears 04/11/2020  . Insomnia 04/11/2020  . Stress incontinence 10/09/2019  . Morbid obesity (HCC) 08/15/2019  . Mixed hyperlipidemia 09/28/2018  . History of stroke 04/17/2018  . IFG (impaired fasting glucose) 03/28/2018  . Essential hypertension, benign 01/15/2017  . Asthma 01/15/2017  . Obstructive sleep apnea 10/18/2013   Kerrie Buffalo, OTR/L, CLT  Nataki Mccrumb 06/29/2020, 12:47 PM  [facility] MAIN [facility] SERVICES [address] [place], [place], [address] Phone: [phone]   Fax:  [phone]  Name: TEMPLE EWART MRN: [id number] Date of Birth: 06/06/1955

## 2020-07-02 ENCOUNTER — Ambulatory Visit: Payer: Medicaid Other | Admitting: Occupational Therapy

## 2020-07-02 ENCOUNTER — Other Ambulatory Visit: Payer: Self-pay

## 2020-07-02 DIAGNOSIS — M6281 Muscle weakness (generalized): Secondary | ICD-10-CM

## 2020-07-02 DIAGNOSIS — R278 Other lack of coordination: Secondary | ICD-10-CM

## 2020-07-02 DIAGNOSIS — I69351 Hemiplegia and hemiparesis following cerebral infarction affecting right dominant side: Secondary | ICD-10-CM

## 2020-07-04 ENCOUNTER — Encounter: Payer: Self-pay | Admitting: Occupational Therapy

## 2020-07-04 NOTE — Therapy (Signed)
[place] [facility] MAIN [facility] SERVICES [address] [place], [place], [address] Phone: [phone]   Fax:  [phone]  Occupational Therapy Treatment/Progress Update Reporting period from 04/25/2020 to 07/02/2020  Patient Details  Name: Kristin Solis MRN: [id number] Date of Birth: 07/27/1955 Referring Provider (OT): Aura Dials   Encounter Date: 07/02/2020   OT End of Session - 07/04/20 1615    Visit Number 10    Number of Visits 16    Date for OT Re-Evaluation 08/16/20    Authorization Type Medicaid    OT Start Time 1400    OT Stop Time 1445    OT Time Calculation (min) 45 min    Activity Tolerance Patient tolerated treatment well    Behavior During Therapy [facility] for tasks assessed/performed           Past Medical History:  Diagnosis Date  . Asthma   . Hypertension   . Stroke [facility])     Past Surgical History:  Procedure Laterality Date  . ABDOMINAL HYSTERECTOMY  2005   total  . IR CT HEAD LTD  04/18/2018  . IR INTRAVSC STENT CERV CAROTID W/O EMB-PROT MOD SED INC ANGIO  04/18/2018  . IR PERCUTANEOUS ART THROMBECTOMY/INFUSION INTRACRANIAL INC DIAG ANGIO  04/18/2018  . OOPHORECTOMY    . RADIOLOGY WITH ANESTHESIA N/A 04/17/2018   Procedure: RADIOLOGY WITH ANESTHESIA;  Surgeon: Julieanne Cotton, MD;  Location: [facility] OR;  Service: Radiology;  Laterality: N/A;    There were no vitals filed for this visit.   Subjective Assessment - 07/04/20 1614    Subjective  Pt reports she is doing well, has some difficulty at times with managing her small earring backs.    Pertinent History Patient is a 65 y.o. female with history of stroke,  admitted to ED on 04/17/18 with dysphagia and right sided weakness s/p left MCA CVA, extubated on 04/19/18 and discharged on 04/23/18 to private residence    Patient Stated Goals Be able to move my arm up and down and use it like normal, get rid of the pain.    Currently in Pain? No/denies    Pain Score 0-No pain            Therapeutic Exercise: Pt seen for UE strengthening with use of  UBE in standing, forwards/backwards, alternating levels of resistance from 1.5 to 2.0 for 8 mins total with one short rest break at 1/2 way point.   Grip strengthening with right UE for 20 reps for sustained gripping pattern with resistive hand gripper, 17# for one set, 23# for 10 reps and then moved back to 17# for remaining ten reps.  Cues for position of hand on gripper.    Neuromuscular Reeducation: Pt seen for manipulation of a variety of small earring backs, disc style, metal backs and silicone backs of varying sizes and shapes.  Pt able to sort, pick up and place into organizer with minimal difficulty.    Progress update with skills noted below, goals updated  Response to tx: Pt  Has continued to make good progress with ROM and strengthening of right UE.  Coordination also continues to improve and working towards manipulation of smaller items less than 1/2 inch.  She does require occasional rest breaks with more intense exercises such as use of UBE.  Pt continues to benefit from skilled OT services to maximize safety and independence in daily tasks.    [facility] OT Assessment - 07/04/20 1648      Assessment  Medical Diagnosis CVA    Referring Provider (OT) Harvest Dark, Corrie Dandy    Onset Date/Surgical Date 04/17/18    Hand Dominance Right      Coordination   Right 9 Hole Peg Test 31    Left 9 Hole Peg Test 30      AROM   Right Shoulder Flexion 116 Degrees    Right Shoulder ABduction 112 Degrees    Right Wrist Extension 60 Degrees    Right Wrist Flexion 75 Degrees    Right Wrist Radial Deviation 21 Degrees    Right Wrist Ulnar Deviation 30 Degrees    Left Wrist Extension 64 Degrees    Left Wrist Flexion 75 Degrees      Strength   Overall Strength Comments R 3-/5, left 4/5      Hand Function   Right Hand Grip (lbs) 40    Right Hand Lateral Pinch 12 lbs    Right Hand 3 Point Pinch 14 lbs    Left Hand Grip (lbs) 61     Left Hand Lateral Pinch 14 lbs    Left 3 point pinch 14 lbs                            OT Education - 07/04/20 1615    Education Details HEP, ROM, strength, manipulation of small objects    Person(s) Educated Patient    Methods Explanation;Demonstration    Comprehension Verbalized understanding;Returned demonstration               OT Long Term Goals - 07/04/20 1648      OT LONG TERM GOAL #1   Title Patient will complete home exercise program with modified independence.    Baseline has some exercises but will need updates to current HEP, continues to change and update program    Time 8    Period Weeks    Status On-going    Target Date 08/16/20      OT LONG TERM GOAL #2   Title Pt will demonstrate improvement in FOTO score to 64 or greater to show a clinically relevant change to improve right hand function in daily tasks.    Baseline Score of 60 at eval, recert:  61, 10th visit 61    Time 8    Period Weeks    Status On-going    Target Date 08/16/20      OT LONG TERM GOAL #3   Title Pt will improve grip by 5# to assist with opening jars and containers    Baseline still has difficulty with managing containers, husband assists, recert:  grip unchanged    Time 4    Period Weeks    Status On-going    Target Date 08/16/20      OT LONG TERM GOAL #4   Title Pt will decrease shoulder pain on the right to a 2 or less with reaching tasks involving shoulder flexion to place and remove items from a closet shelf.    Baseline pain increased to 6/10 at times with use of RUE, no pain at rest, Recert:  no pain today, 3/10 some days    Time 8    Period Weeks    Status On-going    Target Date 08/16/20      OT LONG TERM GOAL #5   Title Pt will improve R shoulder flexion by 10 degrees to use right UE during folding laundry, especially with larger items such as a  sheet.    Baseline shoulder flexion 116, 10th visit 120 degrees shoulder flex    Time 8    Period Weeks     Status On-going    Target Date 08/16/20      OT LONG TERM GOAL #6   Title Pt will improve ER to be able to reach and put on seat belt with pain less than 2/10.    Baseline pain with ER at eval, recert pain 3/10    Time 4    Period Weeks    Status On-going    Target Date 08/16/20      OT LONG TERM GOAL #7   Title Pt to be able to vacuum 1-2 rooms with light weight vacuum and no rest breaks.    Baseline can only do 1 room at a time and requires rest breaks    Time 8    Period Weeks    Status Achieved                 Plan - 07/04/20 1639    Clinical Impression Statement Pt  Has continued to make good progress with ROM and strengthening of right UE.  Coordination also continues to improve and working towards manipulation of smaller items less than 1/2 inch.  She does require occasional rest breaks with more intense exercises such as use of UBE.  Pt continues to benefit from skilled OT services to maximize safety and independence in daily tasks.    OT Occupational Profile and History Detailed Assessment- Review of Records and additional review of physical, cognitive, psychosocial history related to current functional performance    Occupational performance deficits (Please refer to evaluation for details): ADL's;IADL's;Work;Leisure    Body Structure / Function / Physical Skills ADL;Coordination;GMC;UE functional use;Balance;Decreased knowledge of use of DME;Flexibility;IADL;Dexterity;FMC;Proprioception;Strength;Edema;ROM    Cognitive Skills Memory    Psychosocial Skills Environmental  Adaptations;Habits;Routines and Behaviors    Rehab Potential Good    Clinical Decision Making Limited treatment options, no task modification necessary    Comorbidities Affecting Occupational Performance: May have comorbidities impacting occupational performance    Modification or Assistance to Complete Evaluation  No modification of tasks or assist necessary to complete eval    OT Frequency 2x /  week    OT Duration 8 weeks    OT Treatment/Interventions Self-care/ADL training;Cryotherapy;Therapeutic exercise;DME and/or AE instruction;Functional Mobility Training;Cognitive remediation/compensation;Balance training;Electrical Stimulation;Neuromuscular education;Manual Therapy;Splinting;Moist Heat;Contrast Bath;Therapeutic activities;Patient/family education;Passive range of motion    Consulted and Agree with Plan of Care Patient           Patient will benefit from skilled therapeutic intervention in order to improve the following deficits and impairments:   Body Structure / Function / Physical Skills: ADL,Coordination,GMC,UE functional use,Balance,Decreased knowledge of use of DME,Flexibility,IADL,Dexterity,FMC,Proprioception,Strength,Edema,ROM Cognitive Skills: Memory Psychosocial Skills: Environmental  Adaptations,Habits,Routines and Behaviors   Visit Diagnosis: Muscle weakness (generalized)  Other lack of coordination  Hemiplegia and hemiparesis following cerebral infarction affecting right dominant side [facility](HCC)    Problem List Patient Active Problem List   Diagnosis Date Noted  . Tinnitus of both ears 04/11/2020  . Insomnia 04/11/2020  . Stress incontinence 10/09/2019  . Morbid obesity (HCC) 08/15/2019  . Mixed hyperlipidemia 09/28/2018  . History of stroke 04/17/2018  . IFG (impaired fasting glucose) 03/28/2018  . Essential hypertension, benign 01/15/2017  . Asthma 01/15/2017  . Obstructive sleep apnea 10/18/2013   Paulett Kaufhold T Manson [place], OTR/L, CLT  Durenda Pechacek 07/04/2020, 4:55 PM  [facility] MAIN [facility] [address]  [place], [place], [address] Phone: [phone]   Fax:  [phone]  Name: ALUNA WHISTON MRN: [id number] Date of Birth: [birth year]

## 2020-07-12 ENCOUNTER — Ambulatory Visit: Payer: Medicaid Other | Admitting: Occupational Therapy

## 2020-07-18 ENCOUNTER — Ambulatory Visit: Payer: Medicaid Other | Attending: Nurse Practitioner | Admitting: Occupational Therapy

## 2020-07-18 ENCOUNTER — Other Ambulatory Visit: Payer: Self-pay

## 2020-07-18 ENCOUNTER — Encounter: Payer: Self-pay | Admitting: Occupational Therapy

## 2020-07-18 DIAGNOSIS — I69351 Hemiplegia and hemiparesis following cerebral infarction affecting right dominant side: Secondary | ICD-10-CM | POA: Diagnosis present

## 2020-07-18 DIAGNOSIS — M6281 Muscle weakness (generalized): Secondary | ICD-10-CM | POA: Diagnosis not present

## 2020-07-18 DIAGNOSIS — R278 Other lack of coordination: Secondary | ICD-10-CM | POA: Insufficient documentation

## 2020-07-18 NOTE — Therapy (Signed)
[place] [facility] MAIN [facility] SERVICES [address] [place], [place], [address] Phone: [phone]   Fax:  [phone]  Occupational Therapy Treatment  Patient Details  Name: Kristin Solis MRN: [id number] Date of Birth: [birth year] Referring Provider (OT): Aura Dials   Encounter Date: 07/18/2020   OT End of Session - 07/18/20 0925    Visit Number 11    Number of Visits 16    Date for OT Re-Evaluation 08/16/20    Authorization Type Medicaid    OT Start Time 0848    OT Stop Time 0930    OT Time Calculation (min) 42 min    Activity Tolerance Patient tolerated treatment well    Behavior During Therapy [facility] for tasks assessed/performed           Past Medical History:  Diagnosis Date  . Asthma   . Hypertension   . Stroke [facility])     Past Surgical History:  Procedure Laterality Date  . ABDOMINAL HYSTERECTOMY  2005   total  . IR CT HEAD LTD  04/18/2018  . IR INTRAVSC STENT CERV CAROTID W/O EMB-PROT MOD SED INC ANGIO  04/18/2018  . IR PERCUTANEOUS ART THROMBECTOMY/INFUSION INTRACRANIAL INC DIAG ANGIO  04/18/2018  . OOPHORECTOMY    . RADIOLOGY WITH ANESTHESIA N/A 04/17/2018   Procedure: RADIOLOGY WITH ANESTHESIA;  Surgeon: Julieanne Cotton, MD;  Location: [facility] OR;  Service: Radiology;  Laterality: N/A;    There were no vitals filed for this visit.   Subjective Assessment - 07/18/20 0921    Subjective  Pt reports she didn't come last week due to bad weather.  [place] the [place] this weekend and trying to do some fishing.    Pertinent History Patient is a 65 y.o. female with history of stroke,  admitted to ED on 04/17/18 with dysphagia and right sided weakness s/p left MCA CVA, extubated on 04/19/18 and discharged on 04/23/18 to private residence    Patient Stated Goals Be able to move my arm up and down and use it like normal, get rid of the pain.    Currently in Pain? No/denies    Pain Score 0-No pain            Therapeutic  Exercise: Patient seen for ROM and strengthening with use of 2# dumbbell weights for shoulder flexion, chest press, elbow flexion/extension, wrist flexion/extension, forearm supination/pronation for 2 sets of 10 reps each with therapist demonstration and cues for proper form and technique.  Attempted shoulder ABD however difficulty performing with weight, able to complete one set of 10 reps with motion only but appears to aggravate the shoulder on the right side.    Neuromuscular Reeducation:   Pt seen for fine motor coordination skills with manipulation of small word tiles to flip and place onto flat surface, then moving pieces with isolated finger movements for some and picking up pieces for others to be able to move into forming words, able to complete use of all letters except for 3 remaining at the end of the task.    Response to tx: Patient continues to progress well with right UE strength, ROM, coordination and improved functional use, remains limited with right shoulder motion for ABD.  Cues for proper form and technique with exercises.  Continue to work towards goals to improve RUE functional use in daily activities at home and in the community.  OT Education - 07/18/20 7672    Education Details HEP, ROM, strength, manipulation of small word tiles    Person(s) Educated Patient    Methods Explanation;Demonstration    Comprehension Verbalized understanding;Returned demonstration               OT Long Term Goals - 07/04/20 1648      OT LONG TERM GOAL #1   Title Patient will complete home exercise program with modified independence.    Baseline has some exercises but will need updates to current HEP, continues to change and update program    Time 8    Period Weeks    Status On-going    Target Date 08/16/20      OT LONG TERM GOAL #2   Title Pt will demonstrate improvement in FOTO score to 64 or greater to show a clinically relevant change to  improve right hand function in daily tasks.    Baseline Score of 60 at eval, recert:  61, 10th visit 61    Time 8    Period Weeks    Status On-going    Target Date 08/16/20      OT LONG TERM GOAL #3   Title Pt will improve grip by 5# to assist with opening jars and containers    Baseline still has difficulty with managing containers, husband assists, recert:  grip unchanged    Time 4    Period Weeks    Status On-going    Target Date 08/16/20      OT LONG TERM GOAL #4   Title Pt will decrease shoulder pain on the right to a 2 or less with reaching tasks involving shoulder flexion to place and remove items from a closet shelf.    Baseline pain increased to 6/10 at times with use of RUE, no pain at rest, Recert:  no pain today, 3/10 some days    Time 8    Period Weeks    Status On-going    Target Date 08/16/20      OT LONG TERM GOAL #5   Title Pt will improve R shoulder flexion by 10 degrees to use right UE during folding laundry, especially with larger items such as a sheet.    Baseline shoulder flexion 116, 10th visit 120 degrees shoulder flex    Time 8    Period Weeks    Status On-going    Target Date 08/16/20      OT LONG TERM GOAL #6   Title Pt will improve ER to be able to reach and put on seat belt with pain less than 2/10.    Baseline pain with ER at eval, recert pain 3/10    Time 4    Period Weeks    Status On-going    Target Date 08/16/20      OT LONG TERM GOAL #7   Title Pt to be able to vacuum 1-2 rooms with light weight vacuum and no rest breaks.    Baseline can only do 1 room at a time and requires rest breaks    Time 8    Period Weeks    Status Achieved                 Plan - 07/18/20 0925    Clinical Impression Statement Patient continues to progress well with right UE strength, ROM, coordination and improved functional use, remains limited with right shoulder motion for ABD.  Cues for proper form and technique with exercises.  Continue to work  towards goals to improve RUE functional use in daily activities at home and in the community.    OT Occupational Profile and History Detailed Assessment- Review of Records and additional review of physical, cognitive, psychosocial history related to current functional performance    Occupational performance deficits (Please refer to evaluation for details): ADL's;IADL's;Work;Leisure    Body Structure / Function / Physical Skills ADL;Coordination;GMC;UE functional use;Balance;Decreased knowledge of use of DME;Flexibility;IADL;Dexterity;FMC;Proprioception;Strength;Edema;ROM    Cognitive Skills Memory    Psychosocial Skills Environmental  Adaptations;Habits;Routines and Behaviors    Rehab Potential Good    Clinical Decision Making Limited treatment options, no task modification necessary    Comorbidities Affecting Occupational Performance: May have comorbidities impacting occupational performance    Modification or Assistance to Complete Evaluation  No modification of tasks or assist necessary to complete eval    OT Frequency 2x / week    OT Duration 8 weeks    OT Treatment/Interventions Self-care/ADL training;Cryotherapy;Therapeutic exercise;DME and/or AE instruction;Functional Mobility Training;Cognitive remediation/compensation;Balance training;Electrical Stimulation;Neuromuscular education;Manual Therapy;Splinting;Moist Heat;Contrast Bath;Therapeutic activities;Patient/family education;Passive range of motion    Consulted and Agree with Plan of Care Patient           Patient will benefit from skilled therapeutic intervention in order to improve the following deficits and impairments:   Body Structure / Function / Physical Skills: ADL,Coordination,GMC,UE functional use,Balance,Decreased knowledge of use of DME,Flexibility,IADL,Dexterity,FMC,Proprioception,Strength,Edema,ROM Cognitive Skills: Memory Psychosocial Skills: Environmental  Adaptations,Habits,Routines and Behaviors   Visit  Diagnosis: Muscle weakness (generalized)  Other lack of coordination  Hemiplegia and hemiparesis following cerebral infarction affecting right dominant side [facility])    Problem List Patient Active Problem List   Diagnosis Date Noted  . Tinnitus of both ears 04/11/2020  . Insomnia 04/11/2020  . Stress incontinence 10/09/2019  . Morbid obesity (HCC) 08/15/2019  . Mixed hyperlipidemia 09/28/2018  . History of stroke 04/17/2018  . IFG (impaired fasting glucose) 03/28/2018  . Essential hypertension, benign 01/15/2017  . Asthma 01/15/2017  . Obstructive sleep apnea 10/18/2013   Kerrie Buffalo, OTR/L, CLT  Caylen Yardley 07/18/2020, 7:04 PM  [place] [facility] MAIN [facility] SERVICES [address] [place], [place], [address] Phone: [phone]   Fax:  [phone]  Name: Kristin Solis MRN: [id number] Date of Birth: December 05, 1955

## 2020-07-23 DIAGNOSIS — G4701 Insomnia due to medical condition: Secondary | ICD-10-CM | POA: Diagnosis not present

## 2020-07-23 DIAGNOSIS — G4733 Obstructive sleep apnea (adult) (pediatric): Secondary | ICD-10-CM | POA: Diagnosis not present

## 2020-07-24 ENCOUNTER — Other Ambulatory Visit: Payer: Self-pay

## 2020-07-24 ENCOUNTER — Ambulatory Visit: Payer: Medicaid Other | Admitting: Occupational Therapy

## 2020-07-24 DIAGNOSIS — R278 Other lack of coordination: Secondary | ICD-10-CM

## 2020-07-24 DIAGNOSIS — I69351 Hemiplegia and hemiparesis following cerebral infarction affecting right dominant side: Secondary | ICD-10-CM

## 2020-07-24 DIAGNOSIS — M6281 Muscle weakness (generalized): Secondary | ICD-10-CM

## 2020-07-31 ENCOUNTER — Other Ambulatory Visit: Payer: Self-pay

## 2020-07-31 ENCOUNTER — Ambulatory Visit: Payer: Medicaid Other | Admitting: Occupational Therapy

## 2020-07-31 DIAGNOSIS — M6281 Muscle weakness (generalized): Secondary | ICD-10-CM | POA: Diagnosis not present

## 2020-07-31 DIAGNOSIS — R278 Other lack of coordination: Secondary | ICD-10-CM

## 2020-07-31 DIAGNOSIS — I69351 Hemiplegia and hemiparesis following cerebral infarction affecting right dominant side: Secondary | ICD-10-CM

## 2020-07-31 NOTE — Therapy (Signed)
[place] [facility] MAIN [facility] SERVICES [address] [place], [place], [address] Phone: [phone]   Fax:  [phone]  Occupational Therapy Treatment  Patient Details  Name: Kristin Solis MRN: [id number] Date of Birth: August 29, 1955 Referring Provider (OT): Aura Dials   Encounter Date: 07/24/2020   OT End of Session - 07/30/20 1015     Visit Number 12    Number of Visits 16    Date for OT Re-Evaluation 08/16/20    Authorization Type Medicaid    OT Start Time 1055    OT Stop Time 1145    OT Time Calculation (min) 50 min    Activity Tolerance Patient tolerated treatment well    Behavior During Therapy [facility] for tasks assessed/performed             Past Medical History:  Diagnosis Date   Asthma    Hypertension    Stroke [facility])     Past Surgical History:  Procedure Laterality Date   ABDOMINAL HYSTERECTOMY  2005   total   IR CT HEAD LTD  04/18/2018   IR INTRAVSC STENT CERV CAROTID W/O EMB-PROT MOD SED INC ANGIO  04/18/2018   IR PERCUTANEOUS ART THROMBECTOMY/INFUSION INTRACRANIAL INC DIAG ANGIO  04/18/2018   OOPHORECTOMY     RADIOLOGY WITH ANESTHESIA N/A 04/17/2018   Procedure: RADIOLOGY WITH ANESTHESIA;  Surgeon: Julieanne Cotton, MD;  Location: [facility] OR;  Service: Radiology;  Laterality: N/A;    There were no vitals filed for this visit.   Subjective Assessment - 07/30/20 1014     Subjective  Pt reports they didn't end up going to the lake after all so she didn't try any fishing.    Pertinent History Patient is a 65 y.o. female with history of stroke,  admitted to ED on 04/17/18 with dysphagia and right sided weakness s/p left MCA CVA, extubated on 04/19/18 and discharged on 04/23/18 to private residence    Patient Stated Goals Be able to move my arm up and down and use it like normal, get rid of the pain.    Currently in Pain? No/denies    Pain Score 0-No pain             Therapeutic Exercise: Patient seen for RUE strengthening and ROM  exercises with use of 2# dowel for shoulder flexion, ABD, ADD, chest press, forwards and backwards circles and diagonal patterns for 2 sets of 12 reps with therapist demo and cues for proper form and technique.   UBE in standing for 8 mins, forwards/backwards and alternating levels of resistance from 2 to 2.5 with therapist in constant attendance to ensure grip and to adjust settings.   Grip strengthening with hand gripper, 17# for 25 reps then 23# for 10 reps and returned to 17# for remaining 15 reps, cues for hand positioning on gripper.   A/AROM in sitting for shoulder ABD, ADD which tends to be most difficult for patient and occasionally produces pain.    Response to tx: Pt continues to progress well with RUE ROM strength and coordination, she still has the most difficulty with shoulder ABD/ADD on the right which can produce a pain pattern.  Continues to demo decrease ROM for shoulder flexion but does not complain of pain with attempts.  Cont to work towards goals in plan of care to improve RUE function for daily activities.                      OT  Education - 07/30/20 1015     Education Details HEP, ROM, strength    Person(s) Educated Patient    Methods Explanation;Demonstration    Comprehension Verbalized understanding;Returned demonstration                 OT Long Term Goals - 07/04/20 1648       OT LONG TERM GOAL #1   Title Patient will complete home exercise program with modified independence.    Baseline has some exercises but will need updates to current HEP, continues to change and update program    Time 8    Period Weeks    Status On-going    Target Date 08/16/20      OT LONG TERM GOAL #2   Title Pt will demonstrate improvement in FOTO score to 64 or greater to show a clinically relevant change to improve right hand function in daily tasks.    Baseline Score of 60 at eval, recert:  61, 10th visit 61    Time 8    Period Weeks    Status On-going     Target Date 08/16/20      OT LONG TERM GOAL #3   Title Pt will improve grip by 5# to assist with opening jars and containers    Baseline still has difficulty with managing containers, husband assists, recert:  grip unchanged    Time 4    Period Weeks    Status On-going    Target Date 08/16/20      OT LONG TERM GOAL #4   Title Pt will decrease shoulder pain on the right to a 2 or less with reaching tasks involving shoulder flexion to place and remove items from a closet shelf.    Baseline pain increased to 6/10 at times with use of RUE, no pain at rest, Recert:  no pain today, 3/10 some days    Time 8    Period Weeks    Status On-going    Target Date 08/16/20      OT LONG TERM GOAL #5   Title Pt will improve R shoulder flexion by 10 degrees to use right UE during folding laundry, especially with larger items such as a sheet.    Baseline shoulder flexion 116, 10th visit 120 degrees shoulder flex    Time 8    Period Weeks    Status On-going    Target Date 08/16/20      OT LONG TERM GOAL #6   Title Pt will improve ER to be able to reach and put on seat belt with pain less than 2/10.    Baseline pain with ER at eval, recert pain 3/10    Time 4    Period Weeks    Status On-going    Target Date 08/16/20      OT LONG TERM GOAL #7   Title Pt to be able to vacuum 1-2 rooms with light weight vacuum and no rest breaks.    Baseline can only do 1 room at a time and requires rest breaks    Time 8    Period Weeks    Status Achieved                   Plan - 07/30/20 1016     Clinical Impression Statement Pt continues to progress well with RUE ROM strength and coordination, she still has the most difficulty with shoulder ABD/ADD on the right which can produce a pain pattern.  Continues  to demo decrease ROM for shoulder flexion but does not complain of pain with attempts.  Cont to work towards goals in plan of care to improve RUE function for daily activities.    OT  Occupational Profile and History Detailed Assessment- Review of Records and additional review of physical, cognitive, psychosocial history related to current functional performance    Occupational performance deficits (Please refer to evaluation for details): ADL's;IADL's;Work;Leisure    Body Structure / Function / Physical Skills ADL;Coordination;GMC;UE functional use;Balance;Decreased knowledge of use of DME;Flexibility;IADL;Dexterity;FMC;Proprioception;Strength;Edema;ROM    Cognitive Skills Memory    Psychosocial Skills Environmental  Adaptations;Habits;Routines and Behaviors    Rehab Potential Good    Clinical Decision Making Limited treatment options, no task modification necessary    Comorbidities Affecting Occupational Performance: May have comorbidities impacting occupational performance    Modification or Assistance to Complete Evaluation  No modification of tasks or assist necessary to complete eval    OT Frequency 2x / week    OT Duration 8 weeks    OT Treatment/Interventions Self-care/ADL training;Cryotherapy;Therapeutic exercise;DME and/or AE instruction;Functional Mobility Training;Cognitive remediation/compensation;Balance training;Electrical Stimulation;Neuromuscular education;Manual Therapy;Splinting;Moist Heat;Contrast Bath;Therapeutic activities;Patient/family education;Passive range of motion    Consulted and Agree with Plan of Care Patient             Patient will benefit from skilled therapeutic intervention in order to improve the following deficits and impairments:   Body Structure / Function / Physical Skills: ADL, Coordination, GMC, UE functional use, Balance, Decreased knowledge of use of DME, Flexibility, IADL, Dexterity, FMC, Proprioception, Strength, Edema, ROM Cognitive Skills: Memory Psychosocial Skills: Environmental  Adaptations, Habits, Routines and Behaviors   Visit Diagnosis: Muscle weakness (generalized)  Other lack of coordination  Hemiplegia and  hemiparesis following cerebral infarction affecting right dominant side [facility])    Problem List Patient Active Problem List   Diagnosis Date Noted   Tinnitus of both ears 04/11/2020   Insomnia 04/11/2020   Stress incontinence 10/09/2019   Morbid obesity (HCC) 08/15/2019   Mixed hyperlipidemia 09/28/2018   History of stroke 04/17/2018   IFG (impaired fasting glucose) 03/28/2018   Essential hypertension, benign 01/15/2017   Asthma 01/15/2017   Obstructive sleep apnea 10/18/2013   Firmin Belisle T Nathalya Wolanski, OTR/L, CLT  Zhara Gieske 07/31/2020, 10:31 AM  [place] [facility] MAIN [facility] SERVICES [address] [place], [place], [address] Phone: [phone]   Fax:  [phone]  Name: Kristin Solis MRN: [id number] Date of Birth: [birth year]

## 2020-08-01 ENCOUNTER — Encounter: Payer: Self-pay | Admitting: Occupational Therapy

## 2020-08-01 NOTE — Therapy (Signed)
[place] [facility] MAIN [facility] SERVICES [address] [place], [place], [address] Phone: [phone]   Fax:  [phone]  Occupational Therapy Treatment  Patient Details  Name: Kristin Solis MRN: [id number] Date of Birth: 10-09-1955 Referring Provider (OT): Aura Dials   Encounter Date: 07/31/2020   OT End of Session - 08/01/20 1332     Visit Number 13    Number of Visits 16    Date for OT Re-Evaluation 08/16/20    Authorization Type Medicaid    OT Start Time 1049    OT Stop Time 1140    OT Time Calculation (min) 51 min    Activity Tolerance Patient tolerated treatment well    Behavior During Therapy [facility] for tasks assessed/performed             Past Medical History:  Diagnosis Date   Asthma    Hypertension    Stroke [facility])     Past Surgical History:  Procedure Laterality Date   ABDOMINAL HYSTERECTOMY  2005   total   IR CT HEAD LTD  04/18/2018   IR INTRAVSC STENT CERV CAROTID W/O EMB-PROT MOD SED INC ANGIO  04/18/2018   IR PERCUTANEOUS ART THROMBECTOMY/INFUSION INTRACRANIAL INC DIAG ANGIO  04/18/2018   OOPHORECTOMY     RADIOLOGY WITH ANESTHESIA N/A 04/17/2018   Procedure: RADIOLOGY WITH ANESTHESIA;  Surgeon: Julieanne Cotton, MD;  Location: [facility] OR;  Service: Radiology;  Laterality: N/A;    There were no vitals filed for this visit.   Subjective Assessment - 08/01/20 1332     Subjective  Pt [place] so she didn't try any fishing.    Pertinent History Patient is a 65 y.o. female with history of stroke,  admitted to ED on 04/17/18 with dysphagia and right sided weakness s/p left MCA CVA, extubated on 04/19/18 and discharged on 04/23/18 to private residence    Patient Stated Goals Be able to move my arm up and down and use it like normal, get rid of the pain.    Currently in Pain? No/denies    Pain Score 0-No pain            Therapeutic Exercise:  In standing, participated in UBE x8 min,  working to increase UB strength and activity tolerance for ADLs, resistance of 2.0 to 2.5, forwards/backwards and alternating level of resistance, therapist in constant attendance to ensure grip and to adjust settings.  Instructed pt in supine cane stretches to maximize R shoulder ROM for ADLs, including bilat shoulder flexion and ER to top of head.  Attempted abduction in supine with cane, but was painful, reporting 4/10 pain.  Guided pt to transition to standing at countertop for R shoulder abduction stretch with vc to square shoulders with a target in front of her.  Instructed pt to keep all stretches within comfortable/tolerable range.    Neuro: Pt participated in table top neuro re-ed activity with [place] discs, turning and flipping for RUE only with good accuracy, then 2 handed to address bilat coordination with fair+ accuracy, slight extra time as compared to 1 handed.     Response to treatment: Pt making good gains with GMC/FMC.  Pt continues to be limited in her ADLs by strength and coordination deficits throughout RUE, as well as R shoulder pain, primarily with abduction.  Continued OT will benefit to address above noted deficits in order to maximize indep with ADLs.  OT Education - 08/01/20 1332     Education Details HEP, ROM, strength    Person(s) Educated Patient    Methods Explanation;Demonstration    Comprehension Verbalized understanding;Returned demonstration                 OT Long Term Goals - 07/04/20 1648       OT LONG TERM GOAL #1   Title Patient will complete home exercise program with modified independence.    Baseline has some exercises but will need updates to current HEP, continues to change and update program    Time 8    Period Weeks    Status On-going    Target Date 08/16/20      OT LONG TERM GOAL #2   Title Pt will demonstrate improvement in FOTO score to 64 or greater to show a clinically relevant change to  improve right hand function in daily tasks.    Baseline Score of 60 at eval, recert:  61, 10th visit 61    Time 8    Period Weeks    Status On-going    Target Date 08/16/20      OT LONG TERM GOAL #3   Title Pt will improve grip by 5# to assist with opening jars and containers    Baseline still has difficulty with managing containers, husband assists, recert:  grip unchanged    Time 4    Period Weeks    Status On-going    Target Date 08/16/20      OT LONG TERM GOAL #4   Title Pt will decrease shoulder pain on the right to a 2 or less with reaching tasks involving shoulder flexion to place and remove items from a closet shelf.    Baseline pain increased to 6/10 at times with use of RUE, no pain at rest, Recert:  no pain today, 3/10 some days    Time 8    Period Weeks    Status On-going    Target Date 08/16/20      OT LONG TERM GOAL #5   Title Pt will improve R shoulder flexion by 10 degrees to use right UE during folding laundry, especially with larger items such as a sheet.    Baseline shoulder flexion 116, 10th visit 120 degrees shoulder flex    Time 8    Period Weeks    Status On-going    Target Date 08/16/20      OT LONG TERM GOAL #6   Title Pt will improve ER to be able to reach and put on seat belt with pain less than 2/10.    Baseline pain with ER at eval, recert pain 3/10    Time 4    Period Weeks    Status On-going    Target Date 08/16/20      OT LONG TERM GOAL #7   Title Pt to be able to vacuum 1-2 rooms with light weight vacuum and no rest breaks.    Baseline can only do 1 room at a time and requires rest breaks    Time 8    Period Weeks    Status Achieved                   Plan - 08/01/20 1157     Clinical Impression Statement Pt making good gains with GMC/FMC.  Pt continues to be limited in her ADLs by strength and coordination deficits throughout RUE, as well as R shoulder pain, primarily with  abduction.  Continued OT will benefit to address  above noted deficits in order to maximize indep with ADLs.    OT Occupational Profile and History Detailed Assessment- Review of Records and additional review of physical, cognitive, psychosocial history related to current functional performance    Occupational performance deficits (Please refer to evaluation for details): ADL's;IADL's;Work;Leisure    Body Structure / Function / Physical Skills ADL;Coordination;GMC;UE functional use;Balance;Decreased knowledge of use of DME;Flexibility;IADL;Dexterity;FMC;Proprioception;Strength;Edema;ROM    Cognitive Skills Memory    Psychosocial Skills Environmental  Adaptations;Habits;Routines and Behaviors    Rehab Potential Good    Clinical Decision Making Limited treatment options, no task modification necessary    Comorbidities Affecting Occupational Performance: May have comorbidities impacting occupational performance    Modification or Assistance to Complete Evaluation  No modification of tasks or assist necessary to complete eval    OT Frequency 2x / week    OT Duration 8 weeks    OT Treatment/Interventions Self-care/ADL training;Cryotherapy;Therapeutic exercise;DME and/or AE instruction;Functional Mobility Training;Cognitive remediation/compensation;Balance training;Electrical Stimulation;Neuromuscular education;Manual Therapy;Splinting;Moist Heat;Contrast Bath;Therapeutic activities;Patient/family education;Passive range of motion    Consulted and Agree with Plan of Care Patient              Patient will benefit from skilled therapeutic intervention in order to improve the following deficits and impairments:   Body Structure / Function / Physical Skills: ADL, Coordination, GMC, UE functional use, Balance, Decreased knowledge of use of DME, Flexibility, IADL, Dexterity, FMC, Proprioception, Strength, Edema, ROM Cognitive Skills: Memory Psychosocial Skills: Environmental  Adaptations, Habits, Routines and Behaviors   Visit Diagnosis: Muscle  weakness (generalized)  Other lack of coordination  Hemiplegia and hemiparesis following cerebral infarction affecting right dominant side [facility])    Problem List Patient Active Problem List   Diagnosis Date Noted   Tinnitus of both ears 04/11/2020   Insomnia 04/11/2020   Stress incontinence 10/09/2019   Morbid obesity (HCC) 08/15/2019   Mixed hyperlipidemia 09/28/2018   History of stroke 04/17/2018   IFG (impaired fasting glucose) 03/28/2018   Essential hypertension, benign 01/15/2017   Asthma 01/15/2017   Obstructive sleep apnea 10/18/2013   Kristin Solis T Kristin Solis, OTR/L, CLT  Kristin Solis 08/02/2020, 11:57 AM  [place] [facility] MAIN [facility] SERVICES [address] [place], [place], [address] Phone: [phone]   Fax:  [phone]  Name: Kristin Solis MRN: [id number] Date of Birth: 1955/04/24

## 2020-08-07 ENCOUNTER — Encounter: Payer: Medicaid Other | Admitting: Occupational Therapy

## 2020-08-13 ENCOUNTER — Other Ambulatory Visit: Payer: Self-pay | Admitting: Nurse Practitioner

## 2020-08-13 DIAGNOSIS — Z1231 Encounter for screening mammogram for malignant neoplasm of breast: Secondary | ICD-10-CM

## 2020-08-14 ENCOUNTER — Other Ambulatory Visit: Payer: Self-pay

## 2020-08-14 ENCOUNTER — Encounter: Payer: Self-pay | Admitting: Occupational Therapy

## 2020-08-14 ENCOUNTER — Ambulatory Visit: Payer: Medicaid Other | Admitting: Occupational Therapy

## 2020-08-14 DIAGNOSIS — R278 Other lack of coordination: Secondary | ICD-10-CM

## 2020-08-14 DIAGNOSIS — I69351 Hemiplegia and hemiparesis following cerebral infarction affecting right dominant side: Secondary | ICD-10-CM

## 2020-08-14 DIAGNOSIS — M6281 Muscle weakness (generalized): Secondary | ICD-10-CM

## 2020-08-14 NOTE — Therapy (Signed)
[place] MAIN [facility] SERVICES [address] [place], [place], [address] Phone: [phone]   Fax:  [phone]  Occupational Therapy Treatment/Discharge Summary  Patient Details  Name: Kristin Solis MRN: [id number] Date of Birth: [birth year] Referring Provider (OT): Marnee Guarneri   Encounter Date: 08/14/2020   OT End of Session - 08/14/20 1109     Visit Number 14    Number of Visits 16    Date for OT Re-Evaluation 08/16/20    Authorization Type Medicaid    OT Start Time 1050    OT Stop Time 1135    OT Time Calculation (min) 45 min    Activity Tolerance Patient tolerated treatment well    Behavior During Therapy [facility] for tasks assessed/performed             Past Medical History:  Diagnosis Date   Asthma    Hypertension    Stroke [facility])     Past Surgical History:  Procedure Laterality Date   ABDOMINAL HYSTERECTOMY  2005   total   IR CT HEAD LTD  04/18/2018   IR INTRAVSC STENT CERV CAROTID W/O EMB-PROT MOD SED INC ANGIO  04/18/2018   IR PERCUTANEOUS ART THROMBECTOMY/INFUSION INTRACRANIAL INC DIAG ANGIO  04/18/2018   OOPHORECTOMY     RADIOLOGY WITH ANESTHESIA N/A 04/17/2018   Procedure: RADIOLOGY WITH ANESTHESIA;  Surgeon: Luanne Bras, MD;  Location: [place];  Service: Radiology;  Laterality: N/A;    There were no vitals filed for this visit.   Subjective Assessment - 08/14/20 1106     Subjective  Pt reports she went out to eat with her family but it was not a good meal.  She plans to go to her sisters for the 4th of july holiday.  Reports the pain is less in her right shoulder now, she can use a towel to reach her back to dry off, able to reach back of head and put a pony tail in now with effort.    Pertinent History Patient is a 65 y.o. female with history of stroke,  admitted to ED on 04/17/18 with dysphagia and right sided weakness s/p left MCA CVA, extubated on 04/19/18 and discharged on 04/23/18 to private residence    Patient  Stated Goals Be able to move my arm up and down and use it like normal, get rid of the pain.    Currently in Pain? Yes    Pain Score 4     Pain Location Shoulder    Pain Orientation Right    Pain Descriptors / Indicators Aching    Pain Type Chronic pain    Pain Onset More than a month ago    Pain Frequency Intermittent    Multiple Pain Sites No                [facility] OT Assessment - 08/14/20 1113       Assessment   Medical Diagnosis CVA    Referring Provider (OT) Marnee Guarneri    Onset Date/Surgical Date 04/17/18    Hand Dominance Right    Next MD Visit 09/2019      Observation/Other Assessments   Focus on Therapeutic Outcomes (FOTO)  66      Coordination   Right 9 Hole Peg Test 28    Left 9 Hole Peg Test 30      AROM   Right Shoulder Flexion 125 Degrees    Right Shoulder ABduction 122 Degrees      Hand  Function   Right Hand Grip (lbs) 45    Right Hand Lateral Pinch 13 lbs    Right Hand 3 Point Pinch 15 lbs    Left Hand Grip (lbs) 60    Left Hand Lateral Pinch 16 lbs    Left 3 point pinch 14 lbs    Comment 2 point pinch R 10, L 14            Therapeutic Exercise:  Pt seen for reassessment of right UE and hand skills as noted in above flow sheet, please refer.   Pt seen for strengthening with use of UBE in standing for 8 mins total, alternating levels of resistance of 2.0 to 2.5 and alternating directions.  Therapist in constant attendance to ensure grip and to adjust settings.   ADL: Handwriting with right hand for a complete paragraph in script with good legibility and no hand fatigue.   Response to tx:   Patient has continued to make good progress in all areas, her pain has consistently decreased to none at rest and occasional pain with repeated reaching and use.  She has modified her craft routine to avoid irritating her right arm and shoulder, utilizing rest breaks and not engaging in tasks for too long at a time.  Her ROM improved in the right  shoulder for both flexion and ABD, as well as grip and pinch.  She is participating in homemaking tasks at home such as folding laundry, sweeping, vacuuming her room and occasional meal prep (husband normally prepares meals).  She is able to demonstrate understanding of her home exercise program.  Will plan to discharge at this time with goals met.                 OT Education - 08/14/20 1109     Education Details goals, plan of care, HEP    Person(s) Educated Patient    Methods Explanation;Demonstration    Comprehension Verbalized understanding;Returned demonstration                 OT Long Term Goals - 08/14/20 1110       OT LONG TERM GOAL #1   Title Patient will complete home exercise program with modified independence.    Baseline has some exercises but will need updates to current HEP, continues to change and update program    Time 8    Period Weeks    Status Achieved      OT LONG TERM GOAL #2   Title Pt will demonstrate improvement in FOTO score to 64 or greater to show a clinically relevant change to improve right hand function in daily tasks.    Baseline Score of 60 at eval, recert:  61, 31VQ visit 61    Time 8    Period Weeks    Status Achieved      OT LONG TERM GOAL #3   Title Pt will improve grip by 5# to assist with opening jars and containers    Baseline still has difficulty with managing containers, husband assists, recert:  grip unchanged    Time 4    Period Weeks    Status Achieved      OT LONG TERM GOAL #4   Title Pt will decrease shoulder pain on the right to a 2 or less with reaching tasks involving shoulder flexion to place and remove items from a closet shelf.    Baseline pain increased to 6/10 at times with use of RUE, no pain at  rest, Recert:  no pain today, 3/10 some days.  Some days pain is less but has some pain with reaching at times.    Time 8    Period Weeks    Status Achieved      OT LONG TERM GOAL #5   Title Pt will improve  R shoulder flexion by 10 degrees to use right UE during folding laundry, especially with larger items such as a sheet.    Baseline shoulder flexion 116, 10th visit 120 degrees shoulder flex    Time 8    Period Weeks    Status Achieved      OT LONG TERM GOAL #6   Title Pt will improve ER to be able to reach and put on seat belt with pain less than 2/10.    Baseline pain with ER at eval, recert pain 5/03    Time 4    Period Weeks    Status Achieved      OT LONG TERM GOAL #7   Title Pt to be able to vacuum 1-2 rooms with light weight vacuum and no rest breaks.    Baseline can only do 1 room at a time and requires rest breaks    Time 8    Period Weeks    Status Achieved                   Plan - 08/14/20 1110     Clinical Impression Statement Patient has continued to make good progress in all areas, her pain has consistently decreased to none at rest and occasional pain with repeated reaching and use.  She has modified her craft routine to avoid irritating her right arm and shoulder, utilizing rest breaks and not engaging in tasks for too long at a time.  Her ROM improved in the right shoulder for both flexion and ABD, as well as grip and pinch.  She is participating in homemaking tasks at home such as folding laundry, sweeping, vacuuming her room and occasional meal prep (husband normally prepares meals).  She is able to demonstrate understanding of her home exercise program.  Will plan to discharge at this time with goals met.    OT Occupational Profile and History Detailed Assessment- Review of Records and additional review of physical, cognitive, psychosocial history related to current functional performance    Occupational performance deficits (Please refer to evaluation for details): ADL's;IADL's;Work;Leisure    Body Structure / Function / Physical Skills ADL;Coordination;GMC;UE functional use;Balance;Decreased knowledge of use of  DME;Flexibility;IADL;Dexterity;FMC;Proprioception;Strength;Edema;ROM    Cognitive Skills Memory    Psychosocial Skills Environmental  Adaptations;Habits;Routines and Behaviors    Rehab Potential Good    Clinical Decision Making Limited treatment options, no task modification necessary    Comorbidities Affecting Occupational Performance: May have comorbidities impacting occupational performance    Modification or Assistance to Complete Evaluation  No modification of tasks or assist necessary to complete eval    OT Frequency 2x / week    OT Duration 8 weeks    OT Treatment/Interventions Self-care/ADL training;Cryotherapy;Therapeutic exercise;DME and/or AE instruction;Functional Mobility Training;Cognitive remediation/compensation;Balance training;Electrical Stimulation;Neuromuscular education;Manual Therapy;Splinting;Moist Heat;Contrast Bath;Therapeutic activities;Patient/family education;Passive range of motion    Consulted and Agree with Plan of Care Patient             Patient will benefit from skilled therapeutic intervention in order to improve the following deficits and impairments:   Body Structure / Function / Physical Skills: ADL, Coordination, GMC, UE functional use, Balance, Decreased knowledge of use  of DME, Flexibility, IADL, Dexterity, [place], Proprioception, Strength, Edema, ROM Cognitive Skills: Memory Psychosocial Skills: Environmental  Adaptations, Habits, Routines and Behaviors   Visit Diagnosis: Muscle weakness (generalized)  Other lack of coordination  Hemiplegia and hemiparesis following cerebral infarction affecting right dominant side [facility])    Problem List Patient Active Problem List   Diagnosis Date Noted   Tinnitus of both ears 04/11/2020   Insomnia 04/11/2020   Stress incontinence 10/09/2019   Morbid obesity ([place]) 08/15/2019   Mixed hyperlipidemia 09/28/2018   History of stroke 04/17/2018   IFG (impaired fasting glucose) 03/28/2018   Essential  hypertension, benign 01/15/2017   Asthma 01/15/2017   Obstructive sleep apnea 10/18/2013   Marck Mcclenny T Blaire Palomino, OTR/L, CLT  Raeshawn Vo 08/14/2020, 3:01 PM  [place] MAIN [facility] SERVICES [address] [place], [place], [address] Phone: [phone]   Fax:  [phone]  Name: Kristin Solis MRN: [id number] Date of Birth: 1956/01/10

## 2020-08-23 DIAGNOSIS — G4733 Obstructive sleep apnea (adult) (pediatric): Secondary | ICD-10-CM | POA: Diagnosis not present

## 2020-08-28 ENCOUNTER — Encounter: Payer: Medicaid Other | Admitting: Occupational Therapy

## 2020-08-30 ENCOUNTER — Other Ambulatory Visit: Payer: Self-pay | Admitting: Nurse Practitioner

## 2020-09-04 ENCOUNTER — Encounter: Payer: Medicaid Other | Admitting: Occupational Therapy

## 2020-09-10 ENCOUNTER — Ambulatory Visit: Payer: Self-pay | Admitting: General Practice

## 2020-09-10 ENCOUNTER — Telehealth: Payer: Self-pay | Admitting: General Practice

## 2020-09-10 DIAGNOSIS — Z8673 Personal history of transient ischemic attack (TIA), and cerebral infarction without residual deficits: Secondary | ICD-10-CM

## 2020-09-10 DIAGNOSIS — E782 Mixed hyperlipidemia: Secondary | ICD-10-CM

## 2020-09-10 DIAGNOSIS — I10 Essential (primary) hypertension: Secondary | ICD-10-CM

## 2020-09-10 DIAGNOSIS — J4540 Moderate persistent asthma, uncomplicated: Secondary | ICD-10-CM

## 2020-09-10 NOTE — Chronic Care Management (AMB) (Signed)
Care Management    RN Visit Note  09/10/2020 Name: Kristin Solis MRN: [id number] DOB: Jan 01, 1956  Subjective: Kristin Solis is a 65 y.o. year old female who is a primary care patient of [name], Kristin Faster, NP. The care management team was consulted for assistance with disease management and care coordination needs.    Engaged with patient by telephone for follow up visit in response to provider referral for case management and/or care coordination services.   Consent to Services:   [name] was given information about Care Management services today including:  Care Management services includes personalized support from designated clinical staff supervised by her physician, including individualized plan of care and coordination with other care providers 24/7 contact phone numbers for assistance for urgent and routine care needs. The patient may stop case management services at any time by phone call to the office staff.  Patient agreed to services and consent obtained.   Assessment: Review of patient past medical history, allergies, medications, health status, including review of consultants reports, laboratory and other test data, was performed as part of comprehensive evaluation and provision of chronic care management services. : Ongoing RNCM and LCSW management for patient and the patient  will be transitioned to the High-Risk Managed Medicaid team. Patient remains active and has been notified of assignment change today. The [facility] team scheduler will reach out to the patient for scheduling needs.   SDOH (Social Determinants of Health) assessments and interventions performed:    Care Plan  No Known Allergies  Outpatient Encounter Medications as of 09/10/2020  Medication Sig Note   albuterol (VENTOLIN HFA) 108 (90 Base) MCG/ACT inhaler Inhale 2 puffs into the lungs every 6 (six) hours as needed for wheezing or shortness of breath. 04/04/2019: PRN; hasn't needed in a while    aspirin  EC 81 MG EC tablet Take 1 tablet (81 mg total) by mouth daily.    atorvastatin (LIPITOR) 40 MG tablet Take 1 tablet (40 mg total) by mouth daily at 6 PM.    Blood Pressure Monitoring (OMRON WRIST BP MONITOR) DEVI To check blood pressure once daily due to past stroke.    fluticasone (FLONASE) 50 MCG/ACT nasal spray Use 2 spray(s) in each nostril once daily    Fluticasone-Salmeterol (ADVAIR DISKUS) 250-50 MCG/DOSE AEPB Inhale 1 puff into the lungs 2 (two) times daily.    lisinopril (ZESTRIL) 5 MG tablet Take 1 tablet (5 mg total) by mouth daily.    No facility-administered encounter medications on file as of 09/10/2020.    Patient Active Problem List   Diagnosis Date Noted   Tinnitus of both ears 04/11/2020   Insomnia 04/11/2020   Stress incontinence 10/09/2019   Morbid obesity ([place]) 08/15/2019   Mixed hyperlipidemia 09/28/2018   History of stroke 04/17/2018   IFG (impaired fasting glucose) 03/28/2018   Essential hypertension, benign 01/15/2017   Asthma 01/15/2017   Obstructive sleep apnea 10/18/2013    Conditions to be addressed/monitored: HTN, HLD, Pulmonary Disease, and History of stroke  Care Plan : RNCM: Asthma (Adult)  Updates made by Vanita Ingles since 09/10/2020 12:00 AM     Problem: RNCM: Disease Progression (Asthma)   Priority: Medium     Long-Range Goal: RNCM: COPD/Asthma Disease Progression Prevented or Minimized   Start Date: 04/23/2020  Expected End Date: 05/08/2021  This Visit's Progress: On track  Priority: Medium  Note:   Current Barriers:  Knowledge deficits related to basic understanding of COPD disease process Knowledge deficits  related to basic COPD self care/management Knowledge deficit related to importance of energy conservation Limited Social Support Unable to independently manage Asthma during exacerbation periods  Lacks social connections Does not contact provider office for questions/concerns  Case Manager Clinical Goal(s): Over the next 120  days patient will report using inhalers as prescribed including rinsing mouth after use Over the next 120 days patient will report utilizing pursed lip breathing for shortness of breath Over the next 120 days, patient will be able to verbalize understanding of COPD action plan and when to seek appropriate levels of medical care Over the next 120 days, patient will engage in lite exercise as tolerated to build/regain stamina and strength and reduce shortness of breath through activity tolerance Over the next 120 days, patient will verbalize basic understanding of COPD disease process and self care activities Over the next 120 days, patient will not be hospitalized for COPD exacerbation  Interventions:  Provided patient with basic written and verbal COPD education on self care/management/and exacerbation prevention  Provided patient with COPD action plan and reinforced importance of daily self assessment. 09-10-2020: States that she stays in most of the time and does not go out. No issues with pollen or other triggers with her breathing at this time.  Provided instruction about proper use of medications used for management of COPD including inhalers Advised patient to self assesses COPD action plan zone and make appointment with provider if in the yellow zone for 48 hours without improvement. Provided patient with education about the role of exercise in the management of COPD. 06-25-2020: The patient is working with OT for strengthening and is happy because this is helping her. Continues to work with the OT and doing well. 09-10-2020: The patient has completed her OT therapy and doing well. Denies any issues with Asthma exacerbation. Will be moving to [facility] team for follow up outreaches. Discussed with the patient today.  Advised patient to engage in light exercise as tolerated 3-5 days a week Provided education about and advised patient to utilize infection prevention strategies to reduce risk of respiratory  infection  Patient Goals/Self-Care Activities:  - activity or exercise based on tolerance encouraged - barriers to medication adherence identified - emotional support provided - medication-adherence assessment completed - participation in asthma disease management program encouraged - screen for functional limitations reviewed - self-awareness of symptom triggers encouraged - symptom control monitored - symptom log or asthma action plan reviewed - symptom triggers identified Follow Up Plan: The care management team will reach out to the patient again over the next 30 to 60 days.   09-10-2020: The patient is transitioning to the [facility] team and the scheduler will reach out to set up an appointment with the CCM team.     Task: RNCM: Monitor and Manage Adherence to Asthma Therapy Completed 09/10/2020  Outcome: Positive  Note:   Care Management Activities:    - activity or exercise based on tolerance encouraged - barriers to medication adherence identified - emotional support provided - medication-adherence assessment completed - participation in asthma disease management program encouraged - screen for functional limitations reviewed - self-awareness of symptom triggers encouraged - symptom control monitored - symptom log or asthma action plan reviewed - symptom triggers identified        Care Plan : RNCM: Management of Stroke (Adult)  Updates made by Vanita Ingles since 09/10/2020 12:00 AM     Problem: RNCM: Emotional Adjustment to Disease (Stroke)   Priority: Medium  Long-Range Goal: RNCM: Management of Stroke   Start Date: 04/23/2020  Expected End Date: 05/08/2021  This Visit's Progress: On track  Priority: Medium  Note:   Current Barriers:  Knowledge Deficits related to OT therapy restarting for patient with past history of a stroke Care Coordination needs related to OT in a patient with history of a stroke  Chronic Disease Management support and education needs  related to deficits from past stroke  Lacks caregiver support.  Unable to independently manage post stroke care  Does not contact provider office for questions/concerns  Nurse Case Manager Clinical Goal(s):  Over the next 120 days, patient will verbalize understanding of plan for stroke prevention Over the next 120 days, patient will work with RNCM, pcp and CCM team to address needs related to residual deficits related to past stroke Over the next 120 days, patient will attend all scheduled medical appointments: 11-22-2020 Over the next 120 days, patient will demonstrate improved adherence to prescribed treatment plan for stroke prevention as evidenced bycalling office for changes, compliance with medications and heart healthy diet and following the recommendations of the care team.   Interventions:  1:1 collaboration with [name], NP regarding development and update of comprehensive plan of care as evidenced by provider attestation and co-signature Inter-disciplinary care team collaboration (see longitudinal plan of care) Evaluation of current treatment plan related to history of stroke  and patient's adherence to plan as established by provider. 09-10-2020: Is doing well with her management of post stroke. Has completed OT. No new concerns Advised patient to call the office if she has not heard back from OT- will discuss referral with the pcp. 06-25-2020: The patient is working with OT and says this is helping her a lot. 09-10-2020: The patient has completed her OT and states she is doing well. Has more sessions if she needs them but does not feel that she needs them any longer.  Provided education to patient re: monitor for changes in condition and to call the provider for new concerns or questions.  Reviewed medications with patient and discussed compliance Collaborated with pcp regarding the patient needing a new referral for OT because they called her in November for the referral last  put in and she needs a new referral for the new year.  She would like to work with Any Tomasita Morrow as she did before. 06-25-2020: The patient is working with Amy and is happy to be back with her. 09-10-2020: Has completed her OT and has no new concerns Discussed plans with patient for ongoing care management follow up and provided patient with direct contact information for care management team Reviewed scheduled/upcoming provider appointments including: 11-22-2020  Patient Goals/Self-Care Activities Over the next 120 days, patient will:  - Patient will self administer medications as prescribed Patient will attend all scheduled provider appointments Patient will call pharmacy for medication refills Patient will call provider office for new concerns or questions Patient will work with BSW to address care coordination needs and will continue to work with the clinical team to address health care and disease management related needs.    - decision-making supported - depression screen reviewed - positive reinforcement provided - problem-solving facilitated - relaxation techniques promoted - self-care encouraged - self-reflection promoted - verbalization of feelings encouraged Follow Up Plan: The care management team will reach out to the patient again over the next 30 to 60 days.  09-10-2020: the patient is transitioning over to the [facility] team and will be followed by  their team.         Task: RNCM: Support Psychosocial Response to Stroke Completed 09/10/2020  Outcome: Positive  Note:   Care Management Activities:    - decision-making supported - depression screen reviewed - positive reinforcement provided - problem-solving facilitated - relaxation techniques promoted - self-care encouraged - self-reflection promoted - verbalization of feelings encouraged        Care Plan : RNCM: Hypertension (Adult)  Updates made by Vanita Ingles since 09/10/2020 12:00 AM     Problem: RNCM:  Hypertension (Hypertension)   Priority: Medium     Long-Range Goal: RNCM: Hypertension Monitored   Start Date: 04/23/2020  Expected End Date: 09/05/2021  This Visit's Progress: On track  Priority: Medium  Note:   Objective:  Last practice recorded BP readings:  BP Readings from Last 3 Encounters:  05/27/20 (!) 150/82  05/23/20 129/76  04/11/20 138/80     Most recent eGFR/CrCl: No results found for: EGFR  No components found for: CRCL Current Barriers:  Knowledge Deficits related to basic understanding of hypertension pathophysiology and self care management Knowledge Deficits related to understanding of medications prescribed for management of hypertension Limited Social Support Unable to independently manage HTN Does not contact provider office for questions/concerns Case Manager Clinical Goal(s):  Over the next 120 days, patient will verbalize understanding of plan for hypertension management Over the next 120 days, patient will attend all scheduled medical appointments: 11-22-2020 Over the next 120 days, patient will demonstrate improved adherence to prescribed treatment plan for hypertension as evidenced by taking all medications as prescribed, monitoring and recording blood pressure as directed, adhering to low sodium/DASH diet Over the next 120 days, patient will demonstrate improved health management independence as evidenced by checking blood pressure as directed and notifying PCP if SBP>160 or DBP > 90, taking all medications as prescribe, and adhering to a low sodium diet as discussed. Interventions:  Evaluation of current treatment plan related to hypertension self management and patient's adherence to plan as established by provider. 09-10-2020: Is doing well. Readings are consistently 129/60, 127/60 range. The patient is doing well and no new concerns. Will transition over to the [facility] team for follow up and continued CCM services.  Provided education to patient re: stroke  prevention, s/s of heart attack and stroke, DASH diet, complications of uncontrolled blood pressure Reviewed medications with patient and discussed importance of compliance. 09-10-2020: endorses compliance  Discussed plans with patient for ongoing care management follow up and provided patient with direct contact information for care management team Advised patient, providing education and rationale, to monitor blood pressure daily and record, calling PCP for findings outside established parameters. The patient states her blood pressures are stable  Reviewed scheduled/upcoming provider appointments including: 11-22-2020  Patient Goals/Self-Care Activities Over the next 120 days, patient will:  - Calls provider office for new concerns, questions, or BP outside discussed parameters Checks BP and records as discussed Follows a low sodium diet/DASH diet - blood pressure trends reviewed - depression screen reviewed - home or ambulatory blood pressure monitoring encouraged Follow Up Plan: The care management team will reach out to the patient again over the next 30 to 60 days.  09-10-2020: Will be contacted by the scheduler for [facility] team.    Task: RNCM: Identify and Monitor Blood Pressure Elevation Completed 09/10/2020  Outcome: Positive  Note:   Care Management Activities:    - blood pressure trends reviewed - depression screen reviewed - home or ambulatory blood pressure monitoring encouraged  Care Plan : RNCM: HLD Management  Updates made by Vanita Ingles since 09/10/2020 12:00 AM     Problem: HLD: Health Promotion or Disease Self-Management (General Plan of Care)   Priority: Medium     Long-Range Goal: RNCM: HLD: Self-Management Plan Developed   Start Date: 04/23/2020  Expected End Date: 05/08/2021  This Visit's Progress: On track  Priority: Medium  Note:   Current Barriers:  Poorly controlled hyperlipidemia, complicated by history of stroke in 2020, HTN Current  antihyperlipidemic regimen: Lipitor 40 mg daily Most recent lipid panel:  Lab Results  Component Value Date   CHOL 141 04/11/2020   HDL 72 04/11/2020   LDLCALC 58 04/11/2020   TRIG 52 04/11/2020   CHOLHDL 2.8 04/18/2018    ASCVD risk enhancing conditions:  History of Stroke, HTN, Asthma Unable to independently manage HLD Does not contact provider office for questions/concerns  RN Care Manager Clinical Goal(s):  Over the next 120 days, patient will work with Consulting civil engineer, providers, and care team towards execution of optimized self-health management plan Over the next 120 days, patient will verbalize understanding of plan for management of HLD Over the next 120 days, patient will attend all scheduled medical appointments: 11-22-2020  Interventions: Medication review performed; medication list updated in electronic medical record.  Inter-disciplinary care team collaboration (see longitudinal plan of care) Referred to pharmacy team for assistance with HLD medication management Evaluation of current treatment plan related to HLD and patient's adherence to plan as established by provider. 09-10-2020: No new concerns related to HLD management. The patient states she is doing well and compliant with plan of care. Will continue to monitor.  Advised patient to call the office for any changes in condition or questions Provided education to patient re: monitoring dietary intake and following heart healthy diet Reviewed medications with patient and discussed compliance. 09-10-2020: Review and the patient is compliant Discussed plans with patient for ongoing care management follow up and provided patient with direct contact information for care management team Reviewed scheduled/upcoming provider appointments including: 11-22-2020   Patient Goals/Self-Care Activities: Over the next 120 days, patient will:   - call for medicine refill 2 or 3 days before it runs out - call if I am sick and can't  take my medicine - keep a list of all the medicines I take; vitamins and herbals too - learn to read medicine labels - use a pillbox to sort medicine - use an alarm clock or phone to remind me to take my medicine - drink 6 to 8 glasses of water each day - eat 3 to 5 servings of fruits and vegetables each day - fill half the plate with nonstarchy vegetables - limit fast food meals to no more than 1 per week - manage portion size - prepare main meal at home 3 to 5 days each week - read food labels for fat, fiber, carbohydrates and portion size - set goal weight - be open to making changes - I can manage, know and watch for signs of a heart attack - if I have chest pain, call for help - learn about small changes that will make a big difference - learn my personal risk factors - barriers to meeting goals identified - change-talk evoked - choices provided - collaboration with team encouraged - decision-making supported - health risks reviewed - problem-solving facilitated - questions answered - readiness for change evaluated - reassurance provided - resources needed to meet goals identified - self-reflection promoted -  self-reliance encouraged - verbalization of feelings encouraged   Follow Up Plan: The care management team will reach out to the patient again over the next 30 to 60 days.  09-10-2020: Transitioning to [facility] team.       Task: RNCM: HLD-Mutually Develop and Royce Macadamia Achievement of Patient Goals Completed 09/10/2020  Outcome: Positive  Note:   Care Management Activities:    - barriers to meeting goals identified - change-talk evoked - choices provided - collaboration with team encouraged - decision-making supported - health risks reviewed - problem-solving facilitated - questions answered - readiness for change evaluated - reassurance provided - resources needed to meet goals identified - self-reflection promoted - self-reliance encouraged - verbalization of  feelings encouraged         Plan: The care management team will reach out to the patient again over the next 30 to 60 days. The patient is transitioning to the [facility] team. The scheduler will reach out to the patient to schedule and appointment.   Noreene Larsson RN, MSN, [facility] Mobile: [phone]

## 2020-09-10 NOTE — Patient Instructions (Signed)
  Chronic Care Management   Note  09/10/2020 Name: Kristin Solis MRN: [id number] DOB: [birth year]  The patient is being transitioned to the [facility] team for continued CCM services. Patient is aware and agrees to the transfer to the [facility]. Thank you [name] for allowing me to participate in your care.   Follow up plan: The care management team will reach out to the patient again over the next 30 to 60 days.   Alto Denver RN, MSN, CCM Community Care Coordinator [place]  [facility] Mobile: [phone]

## 2020-09-11 ENCOUNTER — Encounter: Payer: Medicaid Other | Admitting: Occupational Therapy

## 2020-09-11 DIAGNOSIS — G4701 Insomnia due to medical condition: Secondary | ICD-10-CM | POA: Diagnosis not present

## 2020-09-11 DIAGNOSIS — R32 Unspecified urinary incontinence: Secondary | ICD-10-CM | POA: Diagnosis not present

## 2020-09-12 ENCOUNTER — Ambulatory Visit
Admission: RE | Admit: 2020-09-12 | Discharge: 2020-09-12 | Disposition: A | Discharge status: Home / Self Care | Payer: Medicaid Other | Source: Ambulatory Visit | Attending: Nurse Practitioner | Admitting: Nurse Practitioner

## 2020-09-12 ENCOUNTER — Other Ambulatory Visit: Payer: Self-pay

## 2020-09-12 DIAGNOSIS — Z1231 Encounter for screening mammogram for malignant neoplasm of breast: Secondary | ICD-10-CM | POA: Diagnosis not present

## 2020-09-16 ENCOUNTER — Telehealth: Payer: Self-pay | Admitting: Nurse Practitioner

## 2020-09-16 NOTE — Telephone Encounter (Signed)
..   Medicaid Managed Care   Unsuccessful Outreach Note  09/16/2020 Name: Kristin Solis MRN: [id number] DOB: 07-19-1955  Referred by: [name], NP Reason for referral : High Risk Managed Medicaid (Attempted to reach patient today to get her scheduled with the Managed Medicaid Team for a phone visit. I left my name and number on her VM.)   An unsuccessful telephone outreach was attempted today. The patient was referred to the case management team for assistance with care management and care coordination.   Follow Up Plan: The care management team will reach out to the patient again over the next 7-14 days.   Weston Settle Care Guide, High Risk Medicaid Managed Care Embedded Care Coordination [facility]  [facility]

## 2020-09-18 ENCOUNTER — Encounter: Payer: Medicaid Other | Admitting: Occupational Therapy

## 2020-09-25 ENCOUNTER — Encounter: Payer: Medicaid Other | Admitting: Occupational Therapy

## 2020-10-02 ENCOUNTER — Encounter: Payer: Medicaid Other | Admitting: Occupational Therapy

## 2020-10-06 ENCOUNTER — Other Ambulatory Visit: Payer: Self-pay | Admitting: Nurse Practitioner

## 2020-10-06 NOTE — Telephone Encounter (Signed)
Requested Prescriptions  Pending Prescriptions Disp Refills  . fluticasone (FLONASE) 50 MCG/ACT nasal spray [Pharmacy Med Name: Fluticasone Propionate 50 MCG/ACT Nasal Suspension] 16 g 0    Sig: Use 2 spray(s) in each nostril once daily     Ear, Nose, and Throat: Nasal Preparations - Corticosteroids Passed - 10/06/2020  2:19 PM      Passed - Valid encounter within last 12 months    Recent Outpatient Visits          4 months ago Morbid obesity (HCC)   [facility] [name], Shelton T, NP   5 months ago Morbid obesity (HCC)   [facility] [name], Corrie Dandy T, NP   12 months ago Morbid obesity (HCC)   [facility] [place], Corrie Dandy T, NP   1 year ago Acute pain of right thigh   [facility] [place], [name], NP   1 year ago Essential hypertension, benign   [facility] [place], [name], NP      Future Appointments            In 1 month [name], [name], NP [employer], [facility]

## 2020-10-09 ENCOUNTER — Encounter: Payer: Medicaid Other | Admitting: Occupational Therapy

## 2020-10-10 ENCOUNTER — Other Ambulatory Visit: Payer: Self-pay | Admitting: Obstetrics and Gynecology

## 2020-10-10 ENCOUNTER — Other Ambulatory Visit: Payer: Self-pay

## 2020-10-10 NOTE — Patient Outreach (Signed)
Medicaid Managed Care   Nurse Care Manager Note  10/10/2020 Name:  Kristin Solis MRN:  [id number] DOB:  05/13/1955  Kristin Solis is an 65 y.o. year old female who is a primary patient of [name], Kristin Rank, NP.  The [facility] Managed Care Coordination team was consulted for assistance with:    Chronic healthcare management needs.  [name] was given information about Medicaid Managed Care Coordination team services today. Kristin Solis Patient agreed to services and verbal consent obtained.  Engaged with patient by telephone for initial visit in response to provider referral for case management and/or care coordination services.   Assessments/Interventions:  Review of past medical history, allergies, medications, health status, including review of consultants reports, laboratory and other test data, was performed as part of comprehensive evaluation and provision of chronic care management services.  SDOH (Social Determinants of Health) assessments and interventions performed: SDOH Interventions    Flowsheet Row Most Recent Value  SDOH Interventions   Food Insecurity Interventions Intervention Not Indicated  Financial Strain Interventions Intervention Not Indicated  Housing Interventions Intervention Not Indicated  Intimate Partner Violence Interventions Intervention Not Indicated  Physical Activity Interventions Other (Comments)  [patient walks on her street as she can except in the heat, post cva and therapies.]  Stress Interventions Intervention Not Indicated  Transportation Interventions Intervention Not Indicated       Care Plan  No Known Allergies  Medications Reviewed Today     Reviewed by Danie Chandler, RN (Registered Nurse) on 10/10/20 at 1435  Med List Status: <None>   Medication Order Taking? Sig Documenting Provider Last Dose Status Informant  albuterol (VENTOLIN HFA) 108 (90 Base) MCG/ACT inhaler [id number] Yes Inhale 2 puffs into the lungs every 6 (six) hours  as needed for wheezing or shortness of breath. Kristin Skiff, NP Taking Active            Med Note Kristin Solis   Tue Apr 04, 2019  1:14 PM) PRN; hasn'Solis needed in a while   aspirin EC 81 MG EC tablet [id number] Yes Take 1 tablet (81 mg total) by mouth daily. Rinehuls, Kinnie Scales, PA-C Taking Active   atorvastatin (LIPITOR) 40 MG tablet [id number] Yes Take 1 tablet (40 mg total) by mouth daily at 6 PM. Kristin Solis T, NP Taking Active   Blood Pressure Monitoring (OMRON WRIST BP MONITOR) DEVI [id number]  To check blood pressure once daily due to past stroke. Kristin Solis T, NP  Active   fluticasone (FLONASE) 50 MCG/ACT nasal spray [id number] Yes Use 2 spray(s) in each nostril once daily [name], Kristin T, NP Taking Active   Fluticasone-Salmeterol (ADVAIR DISKUS) 250-50 MCG/DOSE AEPB [id number] Yes Inhale 1 puff into the lungs 2 (two) times daily. Kristin Solis T, NP Taking Active   lisinopril (ZESTRIL) 5 MG tablet [id number] Yes Take 1 tablet (5 mg total) by mouth daily. Kristin Skiff, NP Taking Active             Patient Active Problem List   Diagnosis Date Noted   Tinnitus of both ears 04/11/2020   Insomnia 04/11/2020   Stress incontinence 10/09/2019   Morbid obesity (HCC) 08/15/2019   Mixed hyperlipidemia 09/28/2018   History of stroke 04/17/2018   IFG (impaired fasting glucose) 03/28/2018   Essential hypertension, benign 01/15/2017   Asthma 01/15/2017   Obstructive sleep apnea 10/18/2013    Conditions to be addressed/monitored per PCP order:   chronic healthcare management needs, OSA, asthma, HTN, h/o stroke,  HLD  Care Plan : RNCM: Asthma (Adult)  Updates made by Danie Chandlerraft, Kristin Nestor G, RN since 10/10/2020 12:00 AM     Problem: RNCM: Disease Progression (Asthma)   Priority: Medium  Onset Date: 04/23/2020     Long-Range Goal: RNCM: COPD/Asthma Disease Progression Prevented or Minimized   Start Date: 04/23/2020  Expected End Date: 01/10/2021  Recent Progress: On track   Priority: Medium  Note:   Current Barriers:  Knowledge deficits related to basic understanding of COPD disease process Knowledge deficits related to basic COPD self care/management Knowledge deficit related to importance of energy conservation Limited Social Support Unable to independently manage Asthma during exacerbation periods  Lacks social connections Does not contact provider office for questions/concerns  Case Manager Clinical Goal(s): Over the next 120 days patient will report using inhalers as prescribed including rinsing mouth after use Over the next 120 days patient will report utilizing pursed lip breathing for shortness of breath Over the next 120 days, patient will be able to verbalize understanding of COPD action plan and when to seek appropriate levels of medical care Over the next 120 days, patient will engage in lite exercise as tolerated to build/regain stamina and strength and reduce shortness of breath through activity tolerance Over the next 120 days, patient will verbalize basic understanding of COPD disease process and self care activities Over the next 120 days, patient will not be hospitalized for COPD exacerbation  Interventions:  Provided patient with basic written and verbal COPD education on self care/management/and exacerbation prevention  Provided patient with COPD action plan and reinforced importance of daily self assessment. 09-10-2020: States that she stays in most of the time and does not go out. No issues with pollen or other triggers with her breathing at this time.  Provided instruction about proper use of medications used for management of COPD including inhalers Advised patient to self assesses COPD action plan zone and make appointment with provider if in the yellow zone for 48 hours without improvement. Provided patient with education about the role of exercise in the management of COPD. 06-25-2020: The patient is working with OT for strengthening and  is happy because this is helping her. Continues to work with the OT and doing well. 09-10-2020: The patient has completed her OT therapy and doing well. Denies any issues with Asthma exacerbation. [facility] up outre[facility]. Discussed with the patient today.  Advised patient to engage in light exercise as tolerated 3-5 days a week Provided education about and advised patient to utilize infection prevention strategies to reduce risk of respiratory infection  Update 10/10/20:  Patient without complaint today, is walking 10 minutes a day if not hot. Patient Goals/Self-Care Activities:  - activity or exercise based on tolerance encouraged - barriers to medication adherence identified - emotional support provided - medication-adherence assessment completed - participation in asthma disease management program encouraged - screen for functional limitations reviewed - self-awareness of symptom triggers encouraged - symptom control monitored - symptom log or asthma action plan reviewed - symptom triggers identified Follow Up Plan: The care management team will reach out to the patient again over the next 30 to 60 days.     Care Plan : RNCM: Management of Stroke (Adult)  Updates made by Danie Chandlerraft, Annely Sliva G, RN since 10/10/2020 12:00 AM     Problem: RNCM: Emotional Adjustment to Disease (Stroke)   Priority: Medium  Onset Date: 04/23/2020     Long-Range Goal: RNCM: Management of Stroke  Start Date: 04/23/2020  Expected End Date: 05/08/2021  Recent Progress: On track  Priority: Medium  Note:   Current Barriers:  Knowledge Deficits related to OT therapy restarting for patient with past history of a stroke Care Coordination needs related to OT in a patient with history of a stroke  Chronic Disease Management support and education needs related to deficits from past stroke  Lacks caregiver support.  Unable to independently manage post stroke care  Does not contact provider office  for questions/concerns  Nurse Case Manager Clinical Goal(s):  Over the next 120 days, patient will verbalize understanding of plan for stroke prevention Over the next 120 days, patient will work with RNCM, pcp and CCM team to address needs related to residual deficits related to past stroke Over the next 120 days, patient will attend all scheduled medical appointments: 11-22-2020 Over the next 120 days, patient will demonstrate improved adherence to prescribed treatment plan for stroke prevention as evidenced bycalling office for changes, compliance with medications and heart healthy diet and following the recommendations of the care team.   Interventions:  1:1 collaboration with Kristin Skiff, NP regarding development and update of comprehensive plan of care as evidenced by provider attestation and co-signature Inter-disciplinary care team collaboration (see longitudinal plan of care) Evaluation of current treatment plan related to history of stroke  and patient's adherence to plan as established by provider. 09-10-2020: Is doing well with her management of post stroke. Has completed OT. No new concerns Advised patient to call the office if she has not heard back from OT- will discuss referral with the pcp. 06-25-2020: The patient is working with OT and says this is helping her a lot. 09-10-2020: The patient has completed her OT and states she is doing well. Has more sessions if she needs them but does not feel that she needs them any longer.  Provided education to patient re: monitor for changes in condition and to call the provider for new concerns or questions.  Reviewed medications with patient and discussed compliance Collaborated with pcp regarding the patient needing a new referral for OT because they called her in November for the referral last put in and she needs a new referral for the new year.  She would like to work with Any Arne Cleveland as she did before. 06-25-2020: The patient is working  with Amy and is happy to be back with her. 09-10-2020: Has completed her OT and has no new concerns Discussed plans with patient for ongoing care management follow up and provided patient with direct contact information for care management team Reviewed scheduled/upcoming provider appointments including: 11-22-2020 Update 10/10/20:  Patient without complaint today.  Patient Goals/Self-Care Activities Over the next 120 days, patient will:  - Patient will self administer medications as prescribed Patient will attend all scheduled provider appointments Patient will call pharmacy for medication refills Patient will call provider office for new concerns or questions Patient will work with BSW to address care coordination needs and will continue to work with the clinical team to address health care and disease management related needs.    - decision-making supported - depression screen reviewed - positive reinforcement provided - problem-solving facilitated - relaxation techniques promoted - self-care encouraged - self-reflection promoted - verbalization of feelings encouraged Follow Up Plan: The care management team will reach out to the patient again over the next 30 to 60 days.     Follow Up:  Patient agrees to Care Plan and Follow-up.  Plan: The Managed  Medicaid care management team will reach out to the patient again over the next 30 days. and The patient has been provided with contact information for the Managed Medicaid care management team and has been advised to call with any health related questions or concerns.  Date/time of next scheduled RN care management/care coordination outreach:  11/06/20 at 330.

## 2020-10-10 NOTE — Patient Instructions (Signed)
Hi Kristin Solis, thank you for speaking with me today-I am glad you are doing well.  Kristin Solis was given information about Medicaid Managed Care team care coordination services as a part of their [place] Medicaid benefit. Kristin Solis verbally consented to engagement with the [facility] Managed Care team.   If you are experiencing a medical emergency, please call 911 or report to your local emergency department or urgent care.   If you have a non-emergency medical problem during routine business hours, please contact your provider's office and ask to speak with a nurse.   For questions related to your [facility], please call: [phone] or visit the homepage here: [email or web address]  If you would like to schedule transportation through your [facility], please call the following number at least 2 days in advance of your appointment: [phone].   Call the [place] at [phone], at any time, 24 hours a day, 7 days a week. If you are in danger or need immediate medical attention call 911.  If you would like help to quit smoking, call 1-800-QUIT-NOW 380-035-7750) OR Espaol: 1-855-Djelo-Ya (2-671-245-8099) o para ms informacin haga clic aqu or Text READY to 200-400 to register via text  Kristin Solis - following are the goals we discussed in your visit today:   Goals Addressed             This Visit's Progress    Protect My Health       Timeframe:  Long-Range Goal Priority:  Medium Start Date:      10/10/20                       Expected End Date:      ongoing                 Follow Up Date 11/10/20    - schedule appointment for flu shot - schedule appointment for vaccines needed due to my age or health - schedule recommended health tests (blood work, mammogram, colonoscopy, pap test) - schedule and keep appointment for annual  check-up    Why is this important?   Screening tests can find diseases early when they are easier to treat.  Your doctor or nurse will talk with you about which tests are important for you.  Getting shots for common diseases like the flu and shingles will help prevent them.     Notes:      The patient verbalized understanding of instructions provided today and declined a print copy of patient instruction materials.   The Managed Medicaid care management team will reach out to the patient again over the next 30 days.  The  Patient  has been provided with contact information for the Managed Medicaid care management team and has been advised to call with any health related questions or concerns.   Kristin Raider RN, BSN [place]  [facility] - Managed Medicaid High Risk [phone].    Following is a copy of your plan of care:  Patient Care Plan: RNCM: Asthma (Adult)     Problem Identified: RNCM: Disease Progression (Asthma)   Priority: Medium  Onset Date: 04/23/2020     Long-Range Goal: RNCM: COPD/Asthma Disease Progression Prevented or Minimized   Start Date: 04/23/2020  Expected End Date: 01/10/2021  Recent Progress: On track  Priority: Medium  Note:   Current Barriers:  Knowledge deficits related to  basic understanding of COPD disease process Knowledge deficits related to basic COPD self care/management Knowledge deficit related to importance of energy conservation Limited Social Support Unable to independently manage Asthma during exacerbation periods  Lacks social connections Does not contact provider office for questions/concerns  Case Manager Clinical Goal(s): Over the next 120 days patient will report using inhalers as prescribed including rinsing mouth after use Over the next 120 days patient will report utilizing pursed lip breathing for shortness of breath Over the next 120 days, patient will be able to verbalize  understanding of COPD action plan and when to seek appropriate levels of medical care Over the next 120 days, patient will engage in lite exercise as tolerated to build/regain stamina and strength and reduce shortness of breath through activity tolerance Over the next 120 days, patient will verbalize basic understanding of COPD disease process and self care activities Over the next 120 days, patient will not be hospitalized for COPD exacerbation  Interventions:  Provided patient with basic written and verbal COPD education on self care/management/and exacerbation prevention  Provided patient with COPD action plan and reinforced importance of daily self assessment. 09-10-2020: States that she stays in most of the time and does not go out. No issues with pollen or other triggers with her breathing at this time.  Provided instruction about proper use of medications used for management of COPD including inhalers Advised patient to self assesses COPD action plan zone and make appointment with provider if in the yellow zone for 48 hours without improvement. Provided patient with education about the role of exercise in the management of COPD. 06-25-2020: The patient is working with OT for strengthening and is happy because this is helping her. Continues to work with the OT and doing well. 09-10-2020: The patient has completed her OT therapy and doing well. Denies any issues with Asthma exacerbation. Will be moving to [facility] team for follow up outreaches. Discussed with the patient today.  Advised patient to engage in light exercise as tolerated 3-5 days a week Provided education about and advised patient to utilize infection prevention strategies to reduce risk of respiratory infection  Update 10/10/20:  Patient without complaint today, is walking 10 minutes a day if not hot. Patient Goals/Self-Care Activities:  - activity or exercise based on tolerance encouraged - barriers to medication adherence identified -  emotional support provided - medication-adherence assessment completed - participation in asthma disease management program encouraged - screen for functional limitations reviewed - self-awareness of symptom triggers encouraged - symptom control monitored - symptom log or asthma action plan reviewed - symptom triggers identified Follow Up Plan: The care management team will reach out to the patient again over the next 30 to 60 days.     Task: RNCM: Monitor and Manage Adherence to Asthma Therapy Completed 09/10/2020  Outcome: Positive  Note:   Care Management Activities:    - activity or exercise based on tolerance encouraged - barriers to medication adherence identified - emotional support provided - medication-adherence assessment completed - participation in asthma disease management program encouraged - screen for functional limitations reviewed - self-awareness of symptom triggers encouraged - symptom control monitored - symptom log or asthma action plan reviewed - symptom triggers identified     Patient Care Plan: RNCM: Management of Stroke (Adult)     Problem Identified: RNCM: Emotional Adjustment to Disease (Stroke)   Priority: Medium  Onset Date: 04/23/2020     Long-Range Goal: RNCM: Management of Stroke   Start Date: 04/23/2020  Expected End Date: 05/08/2021  Recent Progress: On track  Priority: Medium  Note:   Current Barriers:  Knowledge Deficits related to OT therapy restarting for patient with past history of a stroke Care Coordination needs related to OT in a patient with history of a stroke  Chronic Disease Management support and education needs related to deficits from past stroke  Lacks caregiver support.  Unable to independently manage post stroke care  Does not contact provider office for questions/concerns  Nurse Case Manager Clinical Goal(s):  Over the next 120 days, patient will verbalize understanding of plan for stroke prevention Over the next 120  days, patient will work with RNCM, pcp and CCM team to address needs related to residual deficits related to past stroke Over the next 120 days, patient will attend all scheduled medical appointments: 11-22-2020 Over the next 120 days, patient will demonstrate improved adherence to prescribed treatment plan for stroke prevention as evidenced bycalling office for changes, compliance with medications and heart healthy diet and following the recommendations of the care team.   Interventions:  1:1 collaboration with [name], NP regarding development and update of comprehensive plan of care as evidenced by provider attestation and co-signature Inter-disciplinary care team collaboration (see longitudinal plan of care) Evaluation of current treatment plan related to history of stroke  and patient's adherence to plan as established by provider. 09-10-2020: Is doing well with her management of post stroke. Has completed OT. No new concerns Advised patient to call the office if she has not heard back from OT- will discuss referral with the pcp. 06-25-2020: The patient is working with OT and says this is helping her a lot. 09-10-2020: The patient has completed her OT and states she is doing well. Has more sessions if she needs them but does not feel that she needs them any longer.  Provided education to patient re: monitor for changes in condition and to call the provider for new concerns or questions.  Reviewed medications with patient and discussed compliance Collaborated with pcp regarding the patient needing a new referral for OT because they called her in November for the referral last put in and she needs a new referral for the new year.  She would like to work with Any Tomasita Morrow as she did before. 06-25-2020: The patient is working with Amy and is happy to be back with her. 09-10-2020: Has completed her OT and has no new concerns Discussed plans with patient for ongoing care management follow up and  provided patient with direct contact information for care management team Reviewed scheduled/upcoming provider appointments including: 11-22-2020 Update 10/10/20:  Patient without complaint today.  Patient Goals/Self-Care Activities Over the next 120 days, patient will:  - Patient will self administer medications as prescribed Patient will attend all scheduled provider appointments Patient will call pharmacy for medication refills Patient will call provider office for new concerns or questions Patient will work with BSW to address care coordination needs and will continue to work with the clinical team to address health care and disease management related needs.    - decision-making supported - depression screen reviewed - positive reinforcement provided - problem-solving facilitated - relaxation techniques promoted - self-care encouraged - self-reflection promoted - verbalization of feelings encouraged Follow Up Plan: The care management team will reach out to the patient again over the next 30 to 60 days.     Task: RNCM: Support Psychosocial Response to Stroke Completed 09/10/2020  Outcome: Positive  Note:   Care Management  Activities:    - decision-making supported - depression screen reviewed - positive reinforcement provided - problem-solving facilitated - relaxation techniques promoted - self-care encouraged - self-reflection promoted - verbalization of feelings encouraged        Patient Care Plan: RNCM: Hypertension (Adult)     Problem Identified: RNCM: Hypertension (Hypertension)   Priority: Medium     Long-Range Goal: RNCM: Hypertension Monitored   Start Date: 04/23/2020  Expected End Date: 09/05/2021  This Visit's Progress: On track  Priority: Medium  Note:   Objective:  Last practice recorded BP readings:  BP Readings from Last 3 Encounters:  05/27/20 (!) 150/82  05/23/20 129/76  04/11/20 138/80     Most recent eGFR/CrCl: No results found for: EGFR  No  components found for: CRCL Current Barriers:  Knowledge Deficits related to basic understanding of hypertension pathophysiology and self care management Knowledge Deficits related to understanding of medications prescribed for management of hypertension Limited Social Support Unable to independently manage HTN Does not contact provider office for questions/concerns Case Manager Clinical Goal(s):  Over the next 120 days, patient will verbalize understanding of plan for hypertension management Over the next 120 days, patient will attend all scheduled medical appointments: 11-22-2020 Over the next 120 days, patient will demonstrate improved adherence to prescribed treatment plan for hypertension as evidenced by taking all medications as prescribed, monitoring and recording blood pressure as directed, adhering to low sodium/DASH diet Over the next 120 days, patient will demonstrate improved health management independence as evidenced by checking blood pressure as directed and notifying PCP if SBP>160 or DBP > 90, taking all medications as prescribe, and adhering to a low sodium diet as discussed. Interventions:  Evaluation of current treatment plan related to hypertension self management and patient's adherence to plan as established by provider. 09-10-2020: Is doing well. Readings are consistently 129/60, 127/60 range. The patient is doing well and no new concerns. Will transition over to the [facility] team for follow up and continued CCM services.  Update 10/10/20:  patient without complaint, checking blood pressure every other day, latest reading 118/64 Provided education to patient re: stroke prevention, s/s of heart attack and stroke, DASH diet, complications of uncontrolled blood pressure Reviewed medications with patient and discussed importance of compliance. endorses compliance  Discussed plans with patient for ongoing care management follow up and provided patient with direct contact information for  care management team Advised patient, providing education and rationale, to monitor blood pressure daily and record, calling PCP for findings outside established parameters. The patient states her blood pressures are stable  Reviewed scheduled/upcoming provider appointments including: 11-22-2020  Patient Goals/Self-Care Activities Over the next 120 days, patient will:  - Calls provider office for new concerns, questions, or BP outside discussed parameters Checks BP and records as discussed Follows a low sodium diet/DASH diet - blood pressure trends reviewed - depression screen reviewed - home or ambulatory blood pressure monitoring encouraged Follow Up Plan: The care management team will reach out to the patient again over the next 30 to 60 days.     Task: RNCM: Identify and Monitor Blood Pressure Elevation Completed 09/10/2020  Outcome: Positive  Note:   Care Management Activities:    - blood pressure trends reviewed - depression screen reviewed - home or ambulatory blood pressure monitoring encouraged        Patient Care Plan: RNCM: HLD Management     Problem Identified: HLD: Health Promotion or Disease Self-Management (General Plan of Care)   Priority: Medium  Long-Range Goal: RNCM: HLD: Self-Management Plan Developed   Start Date: 04/23/2020  Expected End Date: 05/08/2021  This Visit's Progress: On track  Priority: Medium  Note:   Current Barriers:  Poorly controlled hyperlipidemia, complicated by history of stroke in 2020, HTN Current antihyperlipidemic regimen: Lipitor 40 mg daily Most recent lipid panel:  Lab Results  Component Value Date   CHOL 141 04/11/2020   HDL 72 04/11/2020   LDLCALC 58 04/11/2020   TRIG 52 04/11/2020   CHOLHDL 2.8 04/18/2018    ASCVD risk enhancing conditions:  History of Stroke, HTN, Asthma Unable to independently manage HLD Does not contact provider office for questions/concerns  RN Care Manager Clinical Goal(s):  Over the next  120 days, patient will work with Consulting civil engineer, providers, and care team towards execution of optimized self-health management plan Over the next 120 days, patient will verbalize understanding of plan for management of HLD Over the next 120 days, patient will attend all scheduled medical appointments: 11-22-2020  Interventions: Medication review performed; medication list updated in electronic medical record.  Inter-disciplinary care team collaboration (see longitudinal plan of care) Referred to pharmacy team for assistance with HLD medication management Evaluation of current treatment plan related to HLD and patient's adherence to plan as established by provider. 10-10-2020: No new concerns related to HLD management. The patient states she is doing well and compliant with plan of care. Will continue to monitor.  Advised patient to call the office for any changes in condition or questions Provided education to patient re: monitoring dietary intake and following heart healthy diet Reviewed medications with patient and discussed compliance. 10-10-2020: Review and the patient is compliant Discussed plans with patient for ongoing care management follow up and provided patient with direct contact information for care management team Reviewed scheduled/upcoming provider appointments including: 11-22-2020   Patient Goals/Self-Care Activities: Over the next 120 days, patient will:   - call for medicine refill 2 or 3 days before it runs out - call if I am sick and can't take my medicine - keep a list of all the medicines I take; vitamins and herbals too - learn to read medicine labels - use a pillbox to sort medicine - use an alarm clock or phone to remind me to take my medicine - drink 6 to 8 glasses of water each day - eat 3 to 5 servings of fruits and vegetables each day - fill half the plate with nonstarchy vegetables - limit fast food meals to no more than 1 per week - manage portion size -  prepare main meal at home 3 to 5 days each week - read food labels for fat, fiber, carbohydrates and portion size - set goal weight - be open to making changes - I can manage, know and watch for signs of a heart attack - if I have chest pain, call for help - learn about small changes that will make a big difference - learn my personal risk factors - barriers to meeting goals identified - change-talk evoked - choices provided - collaboration with team encouraged - decision-making supported - health risks reviewed - problem-solving facilitated - questions answered - readiness for change evaluated - reassurance provided - resources needed to meet goals identified - self-reflection promoted - self-reliance encouraged - verbalization of feelings encouraged   Follow Up Plan: The care management team will reach out to the patient again over the next 30 to 60 days.      Task: RNCM: HLD-Mutually Develop and  Foster Achievement of Patient Goals Completed 09/10/2020  Outcome: Positive  Note:   Care Management Activities:    - barriers to meeting goals identified - change-talk evoked - choices provided - collaboration with team encouraged - decision-making supported - health risks reviewed - problem-solving facilitated - questions answered - readiness for change evaluated - reassurance provided - resources needed to meet goals identified - self-reflection promoted - self-reliance encouraged - verbalization of feelings encouraged

## 2020-10-15 ENCOUNTER — Telehealth: Payer: Self-pay | Admitting: Nurse Practitioner

## 2020-10-15 NOTE — Telephone Encounter (Signed)
..  Patient declines further follow up and engagement by the Managed Medicaid Team. Appropriate care team members and provider have been notified via electronic communication. The Managed Medicaid Team is available to follow up with the patient after provider conversation with the patient regarding recommendation for engagement and subsequent re-referral to the Managed Medicaid Team.   Patient declined a phone visit with the MM Pharmacist.  Kristin Solis Care Guide, High Risk Medicaid Managed Care Embedded Care Coordination [facility]  [facility]

## 2020-10-16 ENCOUNTER — Ambulatory Visit (INDEPENDENT_AMBULATORY_CARE_PROVIDER_SITE_OTHER): Payer: Medicare Other | Admitting: Nurse Practitioner

## 2020-10-16 ENCOUNTER — Encounter: Payer: Medicaid Other | Admitting: Occupational Therapy

## 2020-10-16 ENCOUNTER — Encounter: Payer: Self-pay | Admitting: Nurse Practitioner

## 2020-10-16 ENCOUNTER — Other Ambulatory Visit: Payer: Self-pay

## 2020-10-16 VITALS — BP 128/74 | HR 63 | Temp 98.9°F | Wt 228.4 lb

## 2020-10-16 DIAGNOSIS — R8281 Pyuria: Secondary | ICD-10-CM | POA: Diagnosis not present

## 2020-10-16 DIAGNOSIS — N23 Unspecified renal colic: Secondary | ICD-10-CM | POA: Diagnosis not present

## 2020-10-16 DIAGNOSIS — R109 Unspecified abdominal pain: Secondary | ICD-10-CM | POA: Insufficient documentation

## 2020-10-16 DIAGNOSIS — R10A1 Flank pain, right side: Secondary | ICD-10-CM | POA: Insufficient documentation

## 2020-10-16 DIAGNOSIS — Z87442 Personal history of urinary calculi: Secondary | ICD-10-CM | POA: Insufficient documentation

## 2020-10-16 LAB — URINALYSIS, ROUTINE W REFLEX MICROSCOPIC
Bilirubin, UA: NEGATIVE
Glucose, UA: NEGATIVE
Ketones, UA: NEGATIVE
Nitrite, UA: NEGATIVE
Protein,UA: NEGATIVE
Specific Gravity, UA: 1.015 (ref 1.005–1.030)
Urobilinogen, Ur: 0.2 mg/dL (ref 0.2–1.0)
pH, UA: 7 (ref 5.0–7.5)

## 2020-10-16 LAB — MICROSCOPIC EXAMINATION: Bacteria, UA: NONE SEEN

## 2020-10-16 MED ORDER — TIZANIDINE HCL 4 MG PO CAPS: 4.0000 mg | ORAL_CAPSULE | Freq: Three times a day (TID) | ORAL | 0 refills | Status: DC | PRN

## 2020-10-16 MED ORDER — TAMSULOSIN HCL 0.4 MG PO CAPS: 0.4000 mg | ORAL_CAPSULE | Freq: Every day | ORAL | 0 refills | Status: DC

## 2020-10-16 NOTE — Addendum Note (Signed)
Addended by: [name] on: 10/16/2020 01:28 PM   Modules accepted: Orders

## 2020-10-16 NOTE — Progress Notes (Signed)
BP 128/74 (BP Location: Left Arm, Patient Position: Sitting, Cuff Size: Large)   Pulse 63   Temp 98.9 F (37.2 C) (Oral)   Wt 228 lb 6.4 oz (103.6 kg)   SpO2 96%   BMI 41.77 kg/m    Subjective:    Patient ID: Kristin Solis, female    DOB: Oct 25, 1955, 65 y.o.   MRN: [id number]  HPI: Kristin Solis is a 65 y.o. female  Chief Complaint  Patient presents with   Back Pain    Patient states she is having back pain and think she has a kidney infection. Patient denies having any issues with urination. Patient states she first noticed it on Sunday morning at 2 AM. Patient denies trying any medications over the counter. Patient states she has a history of kidney stones a few years ago. Patient states she use to see Urology in the past, but does not see them currently.    URINARY SYMPTOMS Started with back pain on Sunday morning at 2 am -- feels similar to when she had kidney stones in past.  Reports Dr. Bernardo Heater performed surgery years ago and was able to remove one, but three were left to left side -- in 2008.  She reports current pain is to lower back right side.    Pain is currently 9/10, can not lay down flat, makes pain worse.  Is intermittent (waxes and wanes -- lasting a few minutes), dull and aching.  Nothing makes pain better -- tried ice pack at home which did not help.   Dysuria: no Urinary frequency: no Urgency: no Small volume voids: no Symptom severity: yes Urinary incontinence: at baseline on occasional Foul odor: no Hematuria: no Abdominal pain: no Back pain: yes Suprapubic pain/pressure: no Flank pain: yes Fever:  no Vomiting: no Status: stable Previous urinary tract infection: yes Recurrent urinary tract infection: no Sexual activity: No sexually active History of sexually transmitted disease:  Treatments attempted: increasing fluids    Relevant past medical, surgical, family and social history reviewed and updated as indicated. Interim medical history since  our last visit reviewed. Allergies and medications reviewed and updated.  Review of Systems  Constitutional:  Negative for activity change, appetite change, diaphoresis, fatigue and fever.  Respiratory:  Negative for cough, chest tightness and shortness of breath.   Cardiovascular:  Negative for chest pain, palpitations and leg swelling.  Gastrointestinal: Negative.   Musculoskeletal:  Positive for back pain.  Neurological: Negative.   Psychiatric/Behavioral: Negative.     Per HPI unless specifically indicated above     Objective:    BP 128/74 (BP Location: Left Arm, Patient Position: Sitting, Cuff Size: Large)   Pulse 63   Temp 98.9 F (37.2 C) (Oral)   Wt 228 lb 6.4 oz (103.6 kg)   SpO2 96%   BMI 41.77 kg/m   Wt Readings from Last 3 Encounters:  10/16/20 228 lb 6.4 oz (103.6 kg)  05/27/20 221 lb (100.2 kg)  05/23/20 222 lb 9.6 oz (101 kg)    Physical Exam Vitals and nursing note reviewed.  Constitutional:      General: She is awake. She is not in acute distress.    Appearance: She is well-developed and well-groomed. She is morbidly obese. She is not ill-appearing or toxic-appearing.  HENT:     Head: Normocephalic.     Right Ear: Hearing normal.     Left Ear: Hearing normal.  Eyes:     General: Lids are normal.  Right eye: No discharge.        Left eye: No discharge.     Conjunctiva/sclera: Conjunctivae normal.     Pupils: Pupils are equal, round, and reactive to light.  Neck:     Vascular: No carotid bruit.  Cardiovascular:     Rate and Rhythm: Normal rate and regular rhythm.     Heart sounds: Normal heart sounds. No murmur heard.   No gallop.  Pulmonary:     Effort: Pulmonary effort is normal. No accessory muscle usage or respiratory distress.     Breath sounds: Normal breath sounds.  Abdominal:     General: Bowel sounds are normal. There is no distension.     Palpations: Abdomen is soft. There is no hepatomegaly.     Tenderness: There is no abdominal  tenderness. There is no right CVA tenderness or left CVA tenderness.     Comments: No rashes noted.  Musculoskeletal:     Cervical back: Normal range of motion and neck supple.     Right lower leg: No edema.     Left lower leg: No edema.  Skin:    General: Skin is warm and dry.  Neurological:     Mental Status: She is alert and oriented to person, place, and time.  Psychiatric:        Attention and Perception: Attention normal.        Mood and Affect: Mood normal.        Speech: Speech normal.        Behavior: Behavior normal. Behavior is cooperative.        Thought Content: Thought content normal.   Results for orders placed or performed in visit on 06/21/20  Osmolality, urine  Result Value Ref Range   Osmolality, Ur 472 mOsmol/kg  Basic metabolic panel  Result Value Ref Range   Glucose 92 65 - 99 mg/dL   BUN 14 8 - 27 mg/dL   Creatinine, Ser 0.98 0.57 - 1.00 mg/dL   eGFR 65 >59 mL/min/1.73   BUN/Creatinine Ratio 14 12 - 28   Sodium 143 134 - 144 mmol/L   Potassium 4.3 3.5 - 5.2 mmol/L   Chloride 104 96 - 106 mmol/L   CO2 24 20 - 29 mmol/L   Calcium 9.6 8.7 - 10.3 mg/dL      Assessment & Plan:   Problem List Items Addressed This Visit       Other   Acute right flank pain - Primary    Current pain acute and starting on Sunday.  History of kidney stones.  UA noting 1+ BLD, 3-10 RBC, 1+ LEUK, no nitrites.  Concern for possible kidney stones at this time, due to presenting with similar symptoms to past episode and hematuria.  Will start on Flomax 0.4 MG daily and Tizanidine to take as needed, educated on each and side effects, not to take muscle relaxer while driving.  Referral to urology placed and STAT imaging CT abd/pelvis to further assess.  Return in one week to office for follow-up, sooner if worsening symptoms.      Relevant Orders   Urinalysis, Routine w reflex microscopic   CT Abdomen Pelvis Wo Contrast   Ambulatory referral to Urology   Personal history of  kidney stones    Refer to flank pain plan of care.      Relevant Orders   CT Abdomen Pelvis Wo Contrast   Ambulatory referral to Urology     Follow up plan: Return in about  1 week (around 10/23/2020) for Flank Pain.

## 2020-10-16 NOTE — Assessment & Plan Note (Signed)
Current pain acute and starting on Sunday.  History of kidney stones.  UA noting 1+ BLD, 3-10 RBC, 1+ LEUK, no nitrites.  Concern for possible kidney stones at this time, due to presenting with similar symptoms to past episode and hematuria.  Will start on Flomax 0.4 MG daily and Tizanidine to take as needed, educated on each and side effects, not to take muscle relaxer while driving.  Referral to urology placed and STAT imaging CT abd/pelvis to further assess.  Return in one week to office for follow-up, sooner if worsening symptoms.

## 2020-10-16 NOTE — Patient Instructions (Signed)
Flank Pain, Adult ?Flank pain is pain in your side. The flank is the area on your side between your upper belly (abdomen) and your spine. The pain may occur over a short time (acute), or it may be long-term or come back often (chronic). It may be mild or very bad. Pain in this area can be caused by many different things. ?Follow these instructions at home: ? ?Drink enough fluid to keep your pee (urine) pale yellow. ?Rest as told by your doctor. ?Take over-the-counter and prescription medicines only as told by your doctor. ?Keep a journal to keep track of: ?What has caused your flank pain. ?What has made your flank pain feel better. ?Keep all follow-up visits. ?Contact a doctor if: ?Medicine does not help your pain. ?You have new symptoms. ?Your pain gets worse. ?Your symptoms last longer than 2-3 days. ?You have trouble peeing. ?You are peeing more often than normal. ?Get help right away if: ?You have trouble breathing. ?You are short of breath. ?Your belly hurts, or it is swollen or red. ?You feel like you may vomit (nauseous). ?You vomit. ?You feel faint, or you faint. ?You have blood in your pee. ?You have flank pain and a fever. ?These symptoms may be an emergency. Get help right away. Call your local emergency services (911 in the [place] ?Do not wait to see if the symptoms will go away. ?Do not drive yourself to the hospital. ?Summary ?Flank pain is pain in your side. The flank is the area of your side between your upper belly (abdomen) and your spine. ?Flank pain may occur over a short time (acute), or it may be long-term or come back often (chronic). It may be mild or very bad. ?Pain in this area can be caused by many different things. ?Contact your doctor if your symptoms get worse or last longer than 2-3 days. ?This information is not intended to replace advice given to you by your health care provider. Make sure you discuss any questions you have with your health care provider. ?Document Revised:  04/15/2020 Document Reviewed: 04/15/2020 ?Elsevier Patient Education ? 2022 [employer] ? ?

## 2020-10-16 NOTE — Assessment & Plan Note (Signed)
Refer to flank pain plan of care. 

## 2020-10-17 ENCOUNTER — Ambulatory Visit: Payer: Medicare Other

## 2020-10-21 LAB — URINE CULTURE

## 2020-10-21 MED ORDER — NITROFURANTOIN MONOHYD MACRO 100 MG PO CAPS: 100.0000 mg | ORAL_CAPSULE | Freq: Two times a day (BID) | ORAL | 0 refills | Status: AC

## 2020-10-21 NOTE — Addendum Note (Signed)
Addended by: [name] on: 10/21/2020 09:40 AM   Modules accepted: Orders

## 2020-10-21 NOTE — Progress Notes (Signed)
Contacted via MyChart   Good morning Kristin Solis, your urine has returned -- it is showing 50,000 to 100,000 bacterial growth.  Often we do not treat unless >100,000.  At this time though with symptoms and bacterial growth showing treatable I will send in Macrobid to be on safe side, could be beginning of infection.  Any questions? Keep being amazing!!  Thank you for allowing me to participate in your care.  I appreciate you. Kindest regards, Teralyn Mullins

## 2020-10-22 ENCOUNTER — Other Ambulatory Visit: Payer: Self-pay

## 2020-10-22 ENCOUNTER — Encounter: Payer: Self-pay | Admitting: Nurse Practitioner

## 2020-10-22 ENCOUNTER — Ambulatory Visit (INDEPENDENT_AMBULATORY_CARE_PROVIDER_SITE_OTHER): Payer: Medicare Other | Admitting: Nurse Practitioner

## 2020-10-22 VITALS — BP 142/74 | HR 73 | Temp 99.2°F | Wt 228.0 lb

## 2020-10-22 DIAGNOSIS — Z87442 Personal history of urinary calculi: Secondary | ICD-10-CM

## 2020-10-22 DIAGNOSIS — R109 Unspecified abdominal pain: Secondary | ICD-10-CM

## 2020-10-22 LAB — URINALYSIS, ROUTINE W REFLEX MICROSCOPIC
Bilirubin, UA: NEGATIVE
Glucose, UA: NEGATIVE
Ketones, UA: NEGATIVE
Nitrite, UA: NEGATIVE
Protein,UA: NEGATIVE
RBC, UA: NEGATIVE
Specific Gravity, UA: 1.02 (ref 1.005–1.030)
Urobilinogen, Ur: 1 mg/dL (ref 0.2–1.0)
pH, UA: 7 (ref 5.0–7.5)

## 2020-10-22 LAB — MICROSCOPIC EXAMINATION: RBC, Urine: NONE SEEN /hpf (ref 0–2)

## 2020-10-22 NOTE — Assessment & Plan Note (Signed)
Current pain acute and starting on Sunday 28th.  History of kidney stones. Concern for possible kidney stones at this time, due to presenting with similar symptoms to past episode and hematuria -- recheck UA today.  Will continue Flomax 0.4 MG daily and Tizanidine to take as needed, educated on each and side effects, not to take muscle relaxer while driving.  Continue Macrobid until complete.  Will try to move up CT scan to prior to urology visit on 16th.  Return in 2 weeks.

## 2020-10-22 NOTE — Telephone Encounter (Signed)
Spoke with patient at visit today.

## 2020-10-22 NOTE — Progress Notes (Signed)
BP (!) 142/74   Pulse 73   Temp 99.2 F (37.3 C) (Oral)   Wt 228 lb (103.4 kg)   SpO2 98%   BMI 41.70 kg/m    Subjective:    Patient ID: Kristin Solis, female    DOB: 11-29-1955, 65 y.o.   MRN: [id number]  HPI: Kristin Solis is a 65 y.o. female  Chief Complaint  Patient presents with   Flank Pain    Patient is here for a 1 week follow up on flank pain. Patient states she is feeling a little better. Patient states the pain shoots from the right side of her back to the front of her abdomen. Patient states the medication helps.    FLANK PAIN Follow-up for flank pain today.  Started with back pain on Sunday morning last week (August 28th) at 2 am -- feels similar to when she had kidney stones in past.  Reports the pain is improved 2-3/10, dull pain from right flank to abdomen.  She was started on Tamsulosin, Tizanidine, and then Macrobid for UTI noted on 10/16/20.  She is scheduled to see urology September 16th.  She is scheduled 11/07/20 for CT scan.  Reports Dr. Lonna Cobb performed surgery years ago and was able to remove one, but three were left to left side -- in 2008.  She reports current pain is to lower back right side.   Dysuria: no Urinary frequency: no Urgency: no Small volume voids: no Symptom severity: yes Urinary incontinence: at baseline on occasional Foul odor: no Hematuria: no Abdominal pain: no Back pain: yes Suprapubic pain/pressure: no Flank pain: yes Fever:  no Vomiting: no Status: stable Previous urinary tract infection: yes Recurrent urinary tract infection: no Sexual activity: No sexually active History of sexually transmitted disease:  Treatments attempted: increasing fluids    Relevant past medical, surgical, family and social history reviewed and updated as indicated. Interim medical history since our last visit reviewed. Allergies and medications reviewed and updated.  Review of Systems  Constitutional:  Negative for activity change, appetite  change, diaphoresis, fatigue and fever.  Respiratory:  Negative for cough, chest tightness and shortness of breath.   Cardiovascular:  Negative for chest pain, palpitations and leg swelling.  Gastrointestinal: Negative.   Musculoskeletal:  Positive for back pain.  Neurological: Negative.   Psychiatric/Behavioral: Negative.     Per HPI unless specifically indicated above     Objective:    BP (!) 142/74   Pulse 73   Temp 99.2 F (37.3 C) (Oral)   Wt 228 lb (103.4 kg)   SpO2 98%   BMI 41.70 kg/m   Wt Readings from Last 3 Encounters:  10/22/20 228 lb (103.4 kg)  10/16/20 228 lb 6.4 oz (103.6 kg)  05/27/20 221 lb (100.2 kg)    Physical Exam Vitals and nursing note reviewed.  Constitutional:      General: She is awake. She is not in acute distress.    Appearance: She is well-developed and well-groomed. She is morbidly obese. She is not ill-appearing or toxic-appearing.  HENT:     Head: Normocephalic.     Right Ear: Hearing normal.     Left Ear: Hearing normal.  Eyes:     General: Lids are normal.        Right eye: No discharge.        Left eye: No discharge.     Conjunctiva/sclera: Conjunctivae normal.     Pupils: Pupils are equal, round, and reactive to light.  Neck:     Vascular: No carotid bruit.  Cardiovascular:     Rate and Rhythm: Normal rate and regular rhythm.     Heart sounds: Normal heart sounds. No murmur heard.   No gallop.  Pulmonary:     Effort: Pulmonary effort is normal. No accessory muscle usage or respiratory distress.     Breath sounds: Normal breath sounds.  Abdominal:     General: Bowel sounds are normal. There is no distension.     Palpations: Abdomen is soft. There is no hepatomegaly.     Tenderness: There is no abdominal tenderness. There is no right CVA tenderness or left CVA tenderness.     Comments: No rashes noted.  Musculoskeletal:     Cervical back: Normal range of motion and neck supple.     Right lower leg: No edema.     Left lower  leg: No edema.  Skin:    General: Skin is warm and dry.  Neurological:     Mental Status: She is alert and oriented to person, place, and time.  Psychiatric:        Attention and Perception: Attention normal.        Mood and Affect: Mood normal.        Speech: Speech normal.        Behavior: Behavior normal. Behavior is cooperative.        Thought Content: Thought content normal.   Results for orders placed or performed in visit on 10/16/20  Urine Culture   Specimen: Urine   UR  Result Value Ref Range   Urine Culture, Routine Final report (A)    Organism ID, Bacteria Staphylococcus aureus (A)    Antimicrobial Susceptibility Comment   Microscopic Examination   Urine  Result Value Ref Range   WBC, UA 6-10 (A) 0 - 5 /hpf   RBC 3-10 (A) 0 - 2 /hpf   Epithelial Cells (non renal) 0-10 0 - 10 /hpf   Bacteria, UA None seen None seen/Few  Urinalysis, Routine w reflex microscopic  Result Value Ref Range   Specific Gravity, UA 1.015 1.005 - 1.030   pH, UA 7.0 5.0 - 7.5   Color, UA Yellow Yellow   Appearance Ur Clear Clear   Leukocytes,UA 1+ (A) Negative   Protein,UA Negative Negative/Trace   Glucose, UA Negative Negative   Ketones, UA Negative Negative   RBC, UA 1+ (A) Negative   Bilirubin, UA Negative Negative   Urobilinogen, Ur 0.2 0.2 - 1.0 mg/dL   Nitrite, UA Negative Negative   Microscopic Examination See below:       Assessment & Plan:   Problem List Items Addressed This Visit       Other   Acute right flank pain - Primary    Current pain acute and starting on Sunday 28th.  History of kidney stones. Concern for possible kidney stones at this time, due to presenting with similar symptoms to past episode and hematuria -- recheck UA today.  Will continue Flomax 0.4 MG daily and Tizanidine to take as needed, educated on each and side effects, not to take muscle relaxer while driving.  Continue Macrobid until complete.  Will try to move up CT scan to prior to urology visit on  16th.  Return in 2 weeks.      Relevant Orders   Urinalysis, Routine w reflex microscopic   Personal history of kidney stones    Refer to flank pain plan of care.  Relevant Orders   Urinalysis, Routine w reflex microscopic     Follow up plan: Return in about 13 days (around 11/04/2020) for Flank Pain.

## 2020-10-22 NOTE — Patient Instructions (Signed)
Flank Pain, Adult ?Flank pain is pain in your side. The flank is the area on your side between your upper belly (abdomen) and your spine. The pain may occur over a short time (acute), or it may be long-term or come back often (chronic). It may be mild or very bad. Pain in this area can be caused by many different things. ?Follow these instructions at home: ? ?Drink enough fluid to keep your pee (urine) pale yellow. ?Rest as told by your doctor. ?Take over-the-counter and prescription medicines only as told by your doctor. ?Keep a journal to keep track of: ?What has caused your flank pain. ?What has made your flank pain feel better. ?Keep all follow-up visits. ?Contact a doctor if: ?Medicine does not help your pain. ?You have new symptoms. ?Your pain gets worse. ?Your symptoms last longer than 2-3 days. ?You have trouble peeing. ?You are peeing more often than normal. ?Get help right away if: ?You have trouble breathing. ?You are short of breath. ?Your belly hurts, or it is swollen or red. ?You feel like you may vomit (nauseous). ?You vomit. ?You feel faint, or you faint. ?You have blood in your pee. ?You have flank pain and a fever. ?These symptoms may be an emergency. Get help right away. Call your local emergency services (911 in the [place] ?Do not wait to see if the symptoms will go away. ?Do not drive yourself to the hospital. ?Summary ?Flank pain is pain in your side. The flank is the area of your side between your upper belly (abdomen) and your spine. ?Flank pain may occur over a short time (acute), or it may be long-term or come back often (chronic). It may be mild or very bad. ?Pain in this area can be caused by many different things. ?Contact your doctor if your symptoms get worse or last longer than 2-3 days. ?This information is not intended to replace advice given to you by your health care provider. Make sure you discuss any questions you have with your health care provider. ?Document Revised:  04/15/2020 Document Reviewed: 04/15/2020 ?Elsevier Patient Education ? 2022 [employer] ? ?

## 2020-10-22 NOTE — Assessment & Plan Note (Signed)
Refer to flank pain plan of care. 

## 2020-10-30 ENCOUNTER — Other Ambulatory Visit: Payer: Self-pay

## 2020-10-30 ENCOUNTER — Encounter: Payer: Self-pay | Admitting: Nurse Practitioner

## 2020-10-30 ENCOUNTER — Ambulatory Visit
Admission: RE | Admit: 2020-10-30 | Discharge: 2020-10-30 | Disposition: A | Discharge status: Home / Self Care | Payer: Medicare Other | Source: Ambulatory Visit | Attending: Nurse Practitioner | Admitting: Nurse Practitioner

## 2020-10-30 DIAGNOSIS — R109 Unspecified abdominal pain: Secondary | ICD-10-CM | POA: Diagnosis not present

## 2020-10-30 DIAGNOSIS — Z87442 Personal history of urinary calculi: Secondary | ICD-10-CM | POA: Insufficient documentation

## 2020-10-30 DIAGNOSIS — N281 Cyst of kidney, acquired: Secondary | ICD-10-CM | POA: Diagnosis not present

## 2020-10-30 DIAGNOSIS — I700 Atherosclerosis of aorta: Secondary | ICD-10-CM | POA: Insufficient documentation

## 2020-10-30 DIAGNOSIS — N200 Calculus of kidney: Secondary | ICD-10-CM | POA: Diagnosis not present

## 2020-10-30 DIAGNOSIS — K449 Diaphragmatic hernia without obstruction or gangrene: Secondary | ICD-10-CM | POA: Diagnosis not present

## 2020-10-30 DIAGNOSIS — K8020 Calculus of gallbladder without cholecystitis without obstruction: Secondary | ICD-10-CM | POA: Diagnosis not present

## 2020-10-30 NOTE — Progress Notes (Signed)
Contacted via MyChart   Good afternoon Kristin Solis, your imaging returned and does notice bilateral kidney stones similar to previous imaging, with increased size in one on right side.  This could be what was causing issues recently.  I am glad we have this before you see urology on 16th.  Will see their recommendations on this since recent urine showed no blood.  Any questions? Keep being amazing!!  Thank you for allowing me to participate in your care.  I appreciate you. Kindest regards, Jaeline Whobrey

## 2020-10-31 DIAGNOSIS — G4701 Insomnia due to medical condition: Secondary | ICD-10-CM | POA: Diagnosis not present

## 2020-11-01 ENCOUNTER — Encounter: Payer: Self-pay | Admitting: Urology

## 2020-11-01 ENCOUNTER — Other Ambulatory Visit: Payer: Self-pay

## 2020-11-01 ENCOUNTER — Ambulatory Visit (INDEPENDENT_AMBULATORY_CARE_PROVIDER_SITE_OTHER): Payer: Medicare Other | Admitting: Urology

## 2020-11-01 VITALS — BP 144/85 | HR 86 | Ht 62.0 in | Wt 223.0 lb

## 2020-11-01 DIAGNOSIS — N200 Calculus of kidney: Secondary | ICD-10-CM

## 2020-11-01 DIAGNOSIS — R109 Unspecified abdominal pain: Secondary | ICD-10-CM

## 2020-11-01 LAB — URINALYSIS, COMPLETE
Bilirubin, UA: NEGATIVE
Glucose, UA: NEGATIVE
Ketones, UA: NEGATIVE
Nitrite, UA: NEGATIVE
Protein,UA: NEGATIVE
RBC, UA: NEGATIVE
Specific Gravity, UA: 1.02 (ref 1.005–1.030)
Urobilinogen, Ur: 0.2 mg/dL (ref 0.2–1.0)
pH, UA: 7 (ref 5.0–7.5)

## 2020-11-01 LAB — MICROSCOPIC EXAMINATION
Bacteria, UA: NONE SEEN
RBC, Urine: NONE SEEN /hpf (ref 0–2)

## 2020-11-01 NOTE — Progress Notes (Signed)
11/01/2020 10:29 AM   Kristin Solis 1955/12/13 [id number]  Referring provider: [name], NP [address] [place],  [place] [address]  Chief Complaint  Patient presents with   Flank Pain    HPI: Kristin Solis is a 65 y.o. female referred for evaluation of flank pain.  Seen by Aura Dials 10/16/2020 with a 3-day history of right flank pain; nonradiating; severity 9/10 and exacerbated in the supine position No fever, chills, dysuria or bothersome LUTS UA with 1+ leukocytes on dipstick and microscopy showing 6-10 WBC/3-10 RBC Empirically treated with Macrobid and urine culture grew 50-100 K staph aureus Seen in follow-up 10/22/2020 with improvement in pain and her pain completely resolved several days later CT 10/30/2020 showed bilateral, nonobstructing renal calculi She remains asymptomatic Prior history stone disease and states she underwent ureteroscopic stone removal by me in 2008 on the left side.  2 calculi were apparently intraparenchymal and unable to be removed   PMH: Past Medical History:  Diagnosis Date   Asthma    Hypertension    Stroke [facility])     Surgical History: Past Surgical History:  Procedure Laterality Date   IR CT HEAD LTD  04/18/2018   IR INTRAVSC STENT CERV CAROTID W/O EMB-PROT MOD SED INC ANGIO  04/18/2018   IR PERCUTANEOUS ART THROMBECTOMY/INFUSION INTRACRANIAL INC DIAG ANGIO  04/18/2018   OOPHORECTOMY     RADIOLOGY WITH ANESTHESIA N/A 04/17/2018   Procedure: RADIOLOGY WITH ANESTHESIA;  Surgeon: Julieanne Cotton, MD;  Location: [facility] OR;  Service: Radiology;  Laterality: N/A;   TOTAL ABDOMINAL HYSTERECTOMY W/ BILATERAL SALPINGOOPHORECTOMY  2005   total    Home Medications:  Allergies as of 11/01/2020   No Known Allergies      Medication List        Accurate as of November 01, 2020 11:59 PM. If you have any questions, ask your nurse or doctor.          albuterol 108 (90 Base) MCG/ACT inhaler Commonly known as: VENTOLIN  HFA Inhale 2 puffs into the lungs every 6 (six) hours as needed for wheezing or shortness of breath.   aspirin 81 MG EC tablet Take 1 tablet (81 mg total) by mouth daily.   atorvastatin 40 MG tablet Commonly known as: LIPITOR Take 1 tablet (40 mg total) by mouth daily at 6 PM.   fluticasone 50 MCG/ACT nasal spray Commonly known as: FLONASE Use 2 spray(s) in each nostril once daily   Fluticasone-Salmeterol 250-50 MCG/DOSE Aepb Commonly known as: Advair Diskus Inhale 1 puff into the lungs 2 (two) times daily.   lisinopril 5 MG tablet Commonly known as: ZESTRIL Take 1 tablet (5 mg total) by mouth daily.   tamsulosin 0.4 MG Caps capsule Commonly known as: FLOMAX Take 1 capsule (0.4 mg total) by mouth daily.   tiZANidine 4 MG capsule Commonly known as: ZANAFLEX Take 1 capsule (4 mg total) by mouth 3 (three) times daily as needed for muscle spasms.        Allergies: No Known Allergies  Family History: Family History  Problem Relation Age of Onset   Cancer Mother        lung   Cancer Father        brain   Diabetes Sister    Alcohol abuse Brother    Stroke Son        x2   CAD Maternal Grandmother    Emphysema Paternal Grandfather    Heart disease Sister  CHF   Breast cancer Neg Hx     Social History:  reports that she has never smoked. She has never used smokeless tobacco. She reports that she does not drink alcohol and does not use drugs.   Physical Exam: BP (!) 144/85   Pulse 86   Ht 5\' 2"  (1.575 m)   Wt 223 lb (101.2 kg)   BMI 40.79 kg/m   Constitutional:  Alert and oriented, No acute distress. HEENT: [place] AT, moist mucus membranes.  Trachea midline, no masses. Cardiovascular: No clubbing, cyanosis, or edema. Respiratory: Normal respiratory effort, no increased work of breathing. Skin: No rashes, bruises or suspicious lesions. Neurologic: Grossly intact, no focal deficits, moving all 4 extremities. Psychiatric: Normal mood and affect.  Laboratory  Data:  Urinalysis 11/01/2020: Dipstick 1+ leukocytes/microscopy 6-10 WBC   Pertinent Imaging: CT images were personally reviewed and interpreted   Assessment & Plan:    1.  Bilateral nephrolithiasis Punctate left renal calculus and nonobstructing right renal calculi Some interval growth since 2014 Unlikely these calculi would be responsible for her prior pain episode.  She most likely had a small ureteral calculus which subsequently passed.  She was asymptomatic at the time of her CT Would not recommend treatment of her recurrent calculi but monitoring. Follow-up 6 months with [facility] Instructed to call earlier for recurrent pain Urinalysis today with mild pyuria.  She is asymptomatic.  A urine culture was ordered.  If positive most likely represents colonization and would not recommend treatment   2015, MD  [facility] [address] [place], [place] [phone]

## 2020-11-02 ENCOUNTER — Ambulatory Visit (INDEPENDENT_AMBULATORY_CARE_PROVIDER_SITE_OTHER): Payer: Medicare Other

## 2020-11-02 ENCOUNTER — Encounter: Payer: Self-pay | Admitting: Urology

## 2020-11-02 DIAGNOSIS — Z0000 Encounter for general adult medical examination without abnormal findings: Secondary | ICD-10-CM | POA: Diagnosis not present

## 2020-11-02 NOTE — Progress Notes (Signed)
Subjective:   Kristin Solis is a 65 y.o. female who presents for Medicare Annual (Subsequent) preventive examination.  I connected with  Kristin Solis on 11/02/20 by an audio only telemedicine application and verified that I am speaking with the correct person using two identifiers.   I discussed the limitations, risks, security and privacy concerns of performing an evaluation and management service by telephone and the availability of in person appointments. I also discussed with the patient that there may be a patient responsible charge related to this service. The patient expressed understanding and verbally consented to this telephonic visit.  Location of Patient: home Location of Provider: office  List any persons and their role that are participating in the visit with the patient.    Review of Systems    Defer to PCP.   Cardiac Risk Factors include: none     Objective:    Today's Vitals   11/02/20 1008  PainSc: 0-No pain   There is no height or weight on file to calculate BMI.  Advanced Directives 11/02/2020 08/14/2019 08/14/2019 12/13/2018 04/17/2018 10/22/2016  Does Patient Have a Medical Advance Directive? No No No No No No  Would patient like information on creating a medical advance directive? No - Patient declined No - Patient declined - Yes (MAU/Ambulatory/Procedural Areas - Information given) No - Patient declined -    Current Medications (verified) Outpatient Encounter Medications as of 11/02/2020  Medication Sig   albuterol (VENTOLIN HFA) 108 (90 Base) MCG/ACT inhaler Inhale 2 puffs into the lungs every 6 (six) hours as needed for wheezing or shortness of breath.   aspirin EC 81 MG EC tablet Take 1 tablet (81 mg total) by mouth daily.   atorvastatin (LIPITOR) 40 MG tablet Take 1 tablet (40 mg total) by mouth daily at 6 PM.   fluticasone (FLONASE) 50 MCG/ACT nasal spray Use 2 spray(s) in each nostril once daily   Fluticasone-Salmeterol (ADVAIR DISKUS) 250-50  MCG/DOSE AEPB Inhale 1 puff into the lungs 2 (two) times daily.   lisinopril (ZESTRIL) 5 MG tablet Take 1 tablet (5 mg total) by mouth daily.   tamsulosin (FLOMAX) 0.4 MG CAPS capsule Take 1 capsule (0.4 mg total) by mouth daily.   tiZANidine (ZANAFLEX) 4 MG capsule Take 1 capsule (4 mg total) by mouth 3 (three) times daily as needed for muscle spasms.   No facility-administered encounter medications on file as of 11/02/2020.    Allergies (verified) Patient has no known allergies.   History: Past Medical History:  Diagnosis Date   Asthma    Hypertension    Stroke [facility])    Past Surgical History:  Procedure Laterality Date   IR CT HEAD LTD  04/18/2018   IR INTRAVSC STENT CERV CAROTID W/O EMB-PROT MOD SED INC ANGIO  04/18/2018   IR PERCUTANEOUS ART THROMBECTOMY/INFUSION INTRACRANIAL INC DIAG ANGIO  04/18/2018   OOPHORECTOMY     RADIOLOGY WITH ANESTHESIA N/A 04/17/2018   Procedure: RADIOLOGY WITH ANESTHESIA;  Surgeon: Julieanne Cotton, MD;  Location: [facility] OR;  Service: Radiology;  Laterality: N/A;   TOTAL ABDOMINAL HYSTERECTOMY W/ BILATERAL SALPINGOOPHORECTOMY  2005   total   Family History  Problem Relation Age of Onset   Cancer Mother        lung   Cancer Father        brain   Diabetes Sister    Alcohol abuse Brother    Stroke Son        x2   CAD Maternal Grandmother  Emphysema Paternal Grandfather    Heart disease Sister        CHF   Breast cancer Neg Hx    Social History   Socioeconomic History   Marital status: Married    Spouse name: Not on file   Number of children: Not on file   Years of education: Not on file   Highest education level: Not on file  Occupational History   Not on file  Tobacco Use   Smoking status: Never   Smokeless tobacco: Never  Vaping Use   Vaping Use: Never used  Substance and Sexual Activity   Alcohol use: No   Drug use: No   Sexual activity: Never  Other Topics Concern   Not on file  Social History Narrative   Not on file    Social Determinants of Health   Financial Resource Strain: Low Risk    Difficulty of Paying Living Expenses: Not hard at all  Food Insecurity: No Food Insecurity   Worried About Programme researcher, broadcasting/film/video in the Last Year: Never true   Ran Out of Food in the Last Year: Never true  Transportation Needs: No Transportation Needs   Lack of Transportation (Medical): No   Lack of Transportation (Non-Medical): No  Physical Activity: Insufficiently Active   Days of Exercise per Week: 3 days   Minutes of Exercise per Session: 20 min  Stress: No Stress Concern Present   Feeling of Stress : Not at all  Social Connections: Socially Isolated   Frequency of Communication with Friends and Family: Twice a week   Frequency of Social Gatherings with Friends and Family: Never   Attends Religious Services: Never   Diplomatic Services operational officer: No   Attends Engineer, structural: Never   Marital Status: Married    Tobacco Counseling Counseling given: Not Answered   Clinical Intake:  Pre-visit preparation completed: Yes  Pain : No/denies pain Pain Score: 0-No pain     Nutritional Risks: None Diabetes: No  How often do you need to have someone help you when you read instructions, pamphlets, or other written materials from your doctor or pharmacy?: 1 - Never What is the last grade level you completed in school?: 10th grade, GED in 2005  Diabetic?No  Interpreter Needed?: No      Activities of Daily Living In your present state of health, do you have any difficulty performing the following activities: 11/02/2020  Hearing? N  Vision? N  Difficulty concentrating or making decisions? Y  Walking or climbing stairs? N  Dressing or bathing? N  Doing errands, shopping? Y  Preparing Food and eating ? N  Using the Toilet? N  In the past six months, have you accidently leaked urine? N  Do you have problems with loss of bowel control? N  Managing your Medications? N  Managing  your Finances? N  Housekeeping or managing your Housekeeping? N  Some recent data might be hidden    Patient Care Team: [name], NP as PCP - General (Nurse Practitioner) Gustavus Bryant, LCSW as Social Worker (Licensed Clinical Social Worker) Marlowe Sax, RN as Case Manager (General Practice) Craft, Calvert Cantor, RN as Case Manager  Indicate any recent Medical Services you may have received from other than [facility] providers in the past year (date may be approximate).     Assessment:   This is a routine wellness examination for [place].  Hearing/Vision screen No results found.  Dietary issues and exercise  activities discussed: Current Exercise Habits: Home exercise routine, Type of exercise: walking, Time (Minutes): 20, Frequency (Times/Week): 3, Weekly Exercise (Minutes/Week): 60, Intensity: Mild, Exercise limited by: None identified   Goals Addressed   None   Depression Screen PHQ 2/9 Scores 11/02/2020 05/23/2020 05/23/2019 09/28/2018 06/27/2018 06/06/2018 03/26/2017  PHQ - 2 Score 0 0 0 0 0 0 0  PHQ- 9 Score - - - 0 - - -    Fall Risk Fall Risk  11/02/2020 11/23/2018 03/30/2018 05/31/2017 03/26/2017  Falls in the past year? 0 0 0 No Yes  Number falls in past yr: 0 - - - 1  Injury with Fall? 0 - - - No  Risk for fall due to : No Fall Risks - - - History of fall(s)  Follow up Falls evaluation completed - Falls evaluation completed - Falls evaluation completed    FALL RISK PREVENTION PERTAINING TO THE HOME:  Any stairs in or around the home? Yes  If so, are there any without handrails? Yes  Home free of loose throw rugs in walkways, pet beds, electrical cords, etc? Yes  Adequate lighting in your home to reduce risk of falls? Yes   ASSISTIVE DEVICES UTILIZED TO PREVENT FALLS:  Life alert? No  Use of a cane, walker or w/c? No  Grab bars in the bathroom? No  Shower chair or bench in shower? No  Elevated toilet seat or a handicapped toilet? No   TIMED UP AND GO:  Was the test  performed?  N/A .  Length of time to ambulate 10 feet: N/A sec.     Cognitive Function:     6CIT Screen 11/02/2020  What Year? 0 points  What month? 0 points  What time? 0 points  Count back from 20 0 points  Months in reverse 0 points  Repeat phrase 2 points  Total Score 2    Immunizations Immunization History  Administered Date(s) Administered   Influenza,inj,Quad PF,6+ Mos 12/07/2018, 01/22/2020   PFIZER(Purple Top)SARS-COV-2 Vaccination 05/09/2019, 05/30/2019, 02/09/2020   Pneumococcal Polysaccharide-23 10/24/2012   Td 10/09/2019   Tdap 05/19/2007    TDAP status: Up to date  Flu Vaccine status: Due, Education has been provided regarding the importance of this vaccine. Advised may receive this vaccine at local pharmacy or Health Dept. Aware to provide a copy of the vaccination record if obtained from local pharmacy or Health Dept. Verbalized acceptance and understanding.  Pneumococcal vaccine status: Up to date  Covid-19 vaccine status: Information provided on how to obtain vaccines.   Qualifies for Shingles Vaccine? Yes   Zostavax completed No   Shingrix Completed?: No.    Education has been provided regarding the importance of this vaccine. Patient has been advised to call insurance company to determine out of pocket expense if they have not yet received this vaccine. Advised may also receive vaccine at local pharmacy or Health Dept. Verbalized acceptance and understanding.  Screening Tests Health Maintenance  Topic Date Due   Hepatitis C Screening  Never done   COVID-19 Vaccine (4 - Booster for Pfizer series) 05/03/2020   INFLUENZA VACCINE  09/16/2020   Zoster Vaccines- Shingrix (1 of 2) 01/15/2021 (Originally 09/13/1974)   COLONOSCOPY (Pts 45-73yrs Insurance coverage will need to be confirmed)  10/16/2021 (Originally 09/12/2001)   MAMMOGRAM  09/13/2022   TETANUS/TDAP  10/08/2029   HIV Screening  Completed   HPV VACCINES  Aged Out    Health  Maintenance  Health Maintenance Due  Topic Date Due  Hepatitis C Screening  Never done   COVID-19 Vaccine (4 - Booster for Pfizer series) 05/03/2020   INFLUENZA VACCINE  09/16/2020    Colorectal Screening: Patient declined  Mammogram status: Completed 09/12/20. Repeat every year   Lung Cancer Screening: (Low Dose CT Chest recommended if Age 71-80 years, 30 pack-year currently smoking OR have quit w/in 15years.) does not qualify.   Lung Cancer Screening Referral: N/A  Additional Screening:  Hepatitis C Screening: does qualify; Completed: Not done  Vision Screening: Recommended annual ophthalmology exams for early detection of glaucoma and other disorders of the eye. Is the patient up to date with their annual eye exam?  Yes  Who is the provider or what is the name of the office in which the patient attends annual eye exams? [facility] If pt is not established with a provider, would they like to be referred to a provider to establish care?  N/A .   Dental Screening: Recommended annual dental exams for proper oral hygiene  Community Resource Referral / Chronic Care Management: CRR required this visit?  No   CCM required this visit?  No      Plan:     I have personally reviewed and noted the following in the patient's chart:   Medical and social history Use of alcohol, tobacco or illicit drugs  Current medications and supplements including opioid prescriptions.  Functional ability and status Nutritional status Physical activity Advanced directives List of other physicians Hospitalizations, surgeries, and ER visits in previous 12 months Vitals Screenings to include cognitive, depression, and falls Referrals and appointments  In addition, I have reviewed and discussed with patient certain preventive protocols, quality metrics, and best practice recommendations. A written personalized care plan for preventive services as well as general preventive health  recommendations were provided to patient.    Ms. Flott , Thank you for taking time to come for your Medicare Wellness Visit. I appreciate your ongoing commitment to your health goals. Please review the following plan we discussed and let me know if I can assist you in the future.   These are the goals we discussed:  Goals       PharmD "I can't afford my medications" (pt-stated)      Current Barriers:  Financial Barriers: RESOLVED, patient has [place] Medicaid coverage, though notes she continues to receive medications from [facility]; complex patient w/ multiple comordbidities including hx CVA (04/2018), HTN, HLD, asthma Reports residual deficits in her arm post stroke, but much improved since she has been engaging in weekly PT/OT ASCVD risk reduction: atorvastatin 40 mg daily; ASA 81 mg daily (completed DAPT w/ Brilinta); LDL at goal <70 HTN: lisinopril 5 mg daily; BP higher than goal at last visit in October  Asthma: Advair 250/50 mcg BID, though patient reports she is taking QAM and not needing any rescue albuterol HFA recently Allergies: fluticasone nasal spray daily   Pharmacist Clinical Goal(s):  Over the next 90 days, patient will work with PharmD and providers to relieve medication access concerns  Interventions: Comprehensive medication management review performed, medication list updated in electronic medical record Reviewed that now that she is insured, she should no longer be receiving medications for free through [facility]. She has follow up with PCP next week. Recommend that all refills be sent to a local pharmacy (eg [facility]) at this time.  Reviewed CCM team roles. She is interested in follow up from RN CM and LCSW, though denies any specific complaints or needs at this  time. Will update team members.  Monitor BP at upcoming PCP appointment. Consider increase in lisinopril dose if needed  Patient Self Care Activities:  Patient will take medications as prescribed  Please see  past updates related to this goal by clicking on the "Past Updates" button in the selected goal        Protect My Health      Timeframe:  Long-Range Goal Priority:  Medium Start Date:      10/10/20                       Expected End Date:      ongoing                 Follow Up Date 11/10/20    - schedule appointment for flu shot - schedule appointment for vaccines needed due to my age or health - schedule recommended health tests (blood work, mammogram, colonoscopy, pap test) - schedule and keep appointment for annual check-up    Why is this important?   Screening tests can find diseases early when they are easier to treat.  Your doctor or nurse will talk with you about which tests are important for you.  Getting shots for common diseases like the flu and shingles will help prevent them.     Notes:       SW-"I need to to gain more support and cope better with daily stressors" (pt-stated)      Current Barriers:  Financial constraints ADL IADL limitations Mental Health Concerns  Limited education about Medicaid benefits  Memory Deficits Inability to perform ADL's independently Inability to perform IADL's independently  Clinical Social Work Clinical Goal(s):  Over the next 90 days, client will work with SW to address concerns related to gaining financial assistance education and getting enrolled in social security.  Over the next 90 days, client will follow up with DSS Medicaid caseworker* as directed by SW  Interventions: Patient interviewed and appropriate assessments performed Provided mental health counseling with regard to patient was emotional throughout phone call due to her daily difficulty with mobility/weakness/balance. LCSW provided reflective listening and implemented appropriate interventions to help suppport patient and her emotional needs  Patient reports that she was only approved for Medicaid  Family Planning but that she would be able to get Full Adult Medicaid  if she were deemed disabled.  CSW used motivational interviewing techniques to engage pt in meaningful conversation and to assess pt's wilingess to implement appropriate self care daily to combat stressors/anxiety. CSW explored pt's coping skills and access to community services. Patient reports a decrease in her overall anxiety/stress/depression Patient reports ongoing active communication between her and her Medicaid caseworker.  Discussed plans with patient for ongoing care management follow up and provided patient with direct contact information for care management team Patient continues to receive $200 in confirms in food stamps per month Assisted patient with obtaining information about health plan benefits Patient confirms spouse provides ongoing caregiver support in the home. Patient denies any current social work needs at this time. Positive reinforcement provided for ongoing healthy self-care implementation. Patient denies having mental health issues as well. Patient is agreeable to contact LCSW if future social work needs arise.   Patient Self Care Activities:  Attends all scheduled provider appointments Calls provider office for new concerns or questions  Please see past updates related to this goal by clicking on the "Past Updates" button in the selected goal  This is a list of the screening recommended for you and due dates:  Health Maintenance  Topic Date Due   Hepatitis C Screening: USPSTF Recommendation to screen - Ages 79-79 yo.  Never done   COVID-19 Vaccine (4 - Booster for Pfizer series) 05/03/2020   Flu Shot  09/16/2020   Zoster (Shingles) Vaccine (1 of 2) 01/15/2021*   Colon Cancer Screening  10/16/2021*   Mammogram  09/13/2022   Tetanus Vaccine  10/08/2029   HIV Screening  Completed   HPV Vaccine  Aged Out  *Topic was postponed. The date shown is not the original due date.      Pablo Ledger, [place]   11/02/2020   Nurse Notes: Non Face to Face 60  minutes

## 2020-11-02 NOTE — Patient Instructions (Signed)
Health Maintenance, Female Adopting a healthy lifestyle and getting preventive care are important in promoting health and wellness. Ask your health care provider about: The right schedule for you to have regular tests and exams. Things you can do on your own to prevent diseases and keep yourself healthy. What should I know about diet, weight, and exercise? Eat a healthy diet  Eat a diet that includes plenty of vegetables, fruits, low-fat dairy products, and lean protein. Do not eat a lot of foods that are high in solid fats, added sugars, or sodium. Maintain a healthy weight Body mass index (BMI) is used to identify weight problems. It estimates body fat based on height and weight. Your health care provider can help determine your BMI and help you achieve or maintain a healthy weight. Get regular exercise Get regular exercise. This is one of the most important things you can do for your health. Most adults should: Exercise for at least 150 minutes each week. The exercise should increase your heart rate and make you sweat (moderate-intensity exercise). Do strengthening exercises at least twice a week. This is in addition to the moderate-intensity exercise. Spend less time sitting. Even light physical activity can be beneficial. Watch cholesterol and blood lipids Have your blood tested for lipids and cholesterol at 64 years of age, then have this test every 5 years. Have your cholesterol levels checked more often if: Your lipid or cholesterol levels are high. You are older than 64 years of age. You are at high risk for heart disease. What should I know about cancer screening? Depending on your health history and family history, you may need to have cancer screening at various ages. This may include screening for: Breast cancer. Cervical cancer. Colorectal cancer. Skin cancer. Lung cancer. What should I know about heart disease, diabetes, and high blood pressure? Blood pressure and heart  disease High blood pressure causes heart disease and increases the risk of stroke. This is more likely to develop in people who have high blood pressure readings, are of African descent, or are overweight. Have your blood pressure checked: Every 3-5 years if you are 18-39 years of age. Every year if you are 40 years old or older. Diabetes Have regular diabetes screenings. This checks your fasting blood sugar level. Have the screening done: Once every three years after age 40 if you are at a normal weight and have a low risk for diabetes. More often and at a younger age if you are overweight or have a high risk for diabetes. What should I know about preventing infection? Hepatitis B If you have a higher risk for hepatitis B, you should be screened for this virus. Talk with your health care provider to find out if you are at risk for hepatitis B infection. Hepatitis C Testing is recommended for: Everyone born from 1945 through 1965. Anyone with known risk factors for hepatitis C. Sexually transmitted infections (STIs) Get screened for STIs, including gonorrhea and chlamydia, if: You are sexually active and are younger than 64 years of age. You are older than 64 years of age and your health care provider tells you that you are at risk for this type of infection. Your sexual activity has changed since you were last screened, and you are at increased risk for chlamydia or gonorrhea. Ask your health care provider if you are at risk. Ask your health care provider about whether you are at high risk for HIV. Your health care provider may recommend a prescription medicine   to help prevent HIV infection. If you choose to take medicine to prevent HIV, you should first get tested for HIV. You should then be tested every 3 months for as long as you are taking the medicine. Pregnancy If you are about to stop having your period (premenopausal) and you may become pregnant, seek counseling before you get  pregnant. Take 400 to 800 micrograms (mcg) of folic acid every day if you become pregnant. Ask for birth control (contraception) if you want to prevent pregnancy. Osteoporosis and menopause Osteoporosis is a disease in which the bones lose minerals and strength with aging. This can result in bone fractures. If you are 65 years old or older, or if you are at risk for osteoporosis and fractures, ask your health care provider if you should: Be screened for bone loss. Take a calcium or vitamin D supplement to lower your risk of fractures. Be given hormone replacement therapy (HRT) to treat symptoms of menopause. Follow these instructions at home: Lifestyle Do not use any products that contain nicotine or tobacco, such as cigarettes, e-cigarettes, and chewing tobacco. If you need help quitting, ask your health care provider. Do not use street drugs. Do not share needles. Ask your health care provider for help if you need support or information about quitting drugs. Alcohol use Do not drink alcohol if: Your health care provider tells you not to drink. You are pregnant, may be pregnant, or are planning to become pregnant. If you drink alcohol: Limit how much you use to 0-1 drink a day. Limit intake if you are breastfeeding. Be aware of how much alcohol is in your drink. In the [place] one drink equals one 12 oz bottle of beer (355 mL), one 5 oz glass of wine (148 mL), or one 1 oz glass of hard liquor (44 mL). General instructions Schedule regular health, dental, and eye exams. Stay current with your vaccines. Tell your health care provider if: You often feel depressed. You have ever been abused or do not feel safe at home. Summary Adopting a healthy lifestyle and getting preventive care are important in promoting health and wellness. Follow your health care provider's instructions about healthy diet, exercising, and getting tested or screened for diseases. Follow your health care provider's  instructions on monitoring your cholesterol and blood pressure. This information is not intended to replace advice given to you by your health care provider. Make sure you discuss any questions you have with your health care provider. Document Revised: 04/12/2020 Document Reviewed: 01/26/2018 Elsevier Patient Education  2022 [employer]  

## 2020-11-06 ENCOUNTER — Other Ambulatory Visit: Payer: Self-pay

## 2020-11-06 ENCOUNTER — Other Ambulatory Visit: Payer: Self-pay | Admitting: Obstetrics and Gynecology

## 2020-11-06 LAB — CULTURE, URINE COMPREHENSIVE

## 2020-11-06 NOTE — Patient Outreach (Signed)
Care Coordination  11/06/2020  Kristin Solis Feb 21, 1955 [id number]  Kristin Solis was referred to the [facility] Managed Care High Risk team for assistance with care coordination and care management services. Care coordination/care management services as part of the Medicaid benefit was offered to the patient.  The patient declined assistance offered on 10/15/20.  Plan: The Medicaid Managed Care High Risk team is available at any time in the future to assist with care coordination/care management services upon referral.   Kathi Der RN, BSN [place]  [facility] Care Management Coordinator - Managed [place] High Risk [phone].

## 2020-11-07 ENCOUNTER — Telehealth: Payer: Self-pay | Admitting: Urology

## 2020-11-07 ENCOUNTER — Ambulatory Visit: Payer: Medicare Other

## 2020-11-07 NOTE — Telephone Encounter (Signed)
I received information from Kristin Solis at [facility] that the urine culture for this patient was not performed.  Please review and determine if patient needs to be contacted to provide another urine specimen.

## 2020-11-13 NOTE — Telephone Encounter (Signed)
Patient was asymptomatic.  Do not need to repeat urine culture

## 2020-11-22 ENCOUNTER — Ambulatory Visit (INDEPENDENT_AMBULATORY_CARE_PROVIDER_SITE_OTHER): Payer: Medicare Other | Admitting: Nurse Practitioner

## 2020-11-22 ENCOUNTER — Other Ambulatory Visit: Payer: Self-pay

## 2020-11-22 ENCOUNTER — Encounter: Payer: Self-pay | Admitting: Nurse Practitioner

## 2020-11-22 DIAGNOSIS — Z23 Encounter for immunization: Secondary | ICD-10-CM

## 2020-11-22 DIAGNOSIS — R7301 Impaired fasting glucose: Secondary | ICD-10-CM

## 2020-11-22 DIAGNOSIS — Z8673 Personal history of transient ischemic attack (TIA), and cerebral infarction without residual deficits: Secondary | ICD-10-CM

## 2020-11-22 DIAGNOSIS — I10 Essential (primary) hypertension: Secondary | ICD-10-CM

## 2020-11-22 DIAGNOSIS — J4540 Moderate persistent asthma, uncomplicated: Secondary | ICD-10-CM | POA: Diagnosis not present

## 2020-11-22 DIAGNOSIS — E782 Mixed hyperlipidemia: Secondary | ICD-10-CM

## 2020-11-22 DIAGNOSIS — Z1159 Encounter for screening for other viral diseases: Secondary | ICD-10-CM

## 2020-11-22 DIAGNOSIS — I700 Atherosclerosis of aorta: Secondary | ICD-10-CM

## 2020-11-22 DIAGNOSIS — G4733 Obstructive sleep apnea (adult) (pediatric): Secondary | ICD-10-CM

## 2020-11-22 DIAGNOSIS — F5101 Primary insomnia: Secondary | ICD-10-CM

## 2020-11-22 MED ORDER — FLUTICASONE-SALMETEROL 250-50 MCG/ACT IN AEPB: 1.0000 | INHALATION_SPRAY | Freq: Two times a day (BID) | RESPIRATORY_TRACT | 4 refills | Status: DC

## 2020-11-22 MED ORDER — LISINOPRIL 5 MG PO TABS: 5.0000 mg | ORAL_TABLET | Freq: Every day | ORAL | 4 refills | Status: DC

## 2020-11-22 MED ORDER — FLUTICASONE PROPIONATE 50 MCG/ACT NA SUSP: NASAL | 4 refills | Status: DC

## 2020-11-22 MED ORDER — ATORVASTATIN CALCIUM 40 MG PO TABS: 40.0000 mg | ORAL_TABLET | Freq: Every day | ORAL | 4 refills | Status: DC

## 2020-11-22 MED ORDER — ALBUTEROL SULFATE HFA 108 (90 BASE) MCG/ACT IN AERS
2.0000 | INHALATION_SPRAY | Freq: Four times a day (QID) | RESPIRATORY_TRACT | 3 refills | Status: DC | PRN
Start: 2020-11-22 — End: 2021-03-27

## 2020-11-22 NOTE — Progress Notes (Signed)
BP 128/80 (BP Location: Left Arm, Patient Position: Sitting)   Pulse 61   Temp 98.6 F (37 C) (Oral)   Wt 230 lb 6.4 oz (104.5 kg)   SpO2 97%   BMI 42.14 kg/m    Subjective:    Patient ID: Kristin Solis, female    DOB: 08/08/1955, 65 y.o.   MRN: [id number]  HPI: Kristin Solis is a 65 y.o. female  Chief Complaint  Patient presents with   Hyperlipidemia   Hypertension   Insomnia    Patient states her insomnia has gotten better with her taking Melatonin every night at bedtime.    Asthma   Sleep Apnea   HYPERTENSION / HYPERLIPIDEMIA Continues on Lisinopril 5 MG, ASA, and Atorvastatin 40 MG daily.  Using CPAP nightly.  Has history of CVA March 2020 and is followed by neurology, Dr. Pearlean Brownie, saw in October 2021.  Has had OT in past and did well with improved ROM.  Aortic atherosclerosis noted on imaging 10/30/20   History of elevated glucose on labs, but A1C one year ago 5.6%. Denies any symptoms at this time. Satisfied with current treatment? yes Duration of hypertension: chronic BP monitoring frequency: daily BP range: 116/70 this morning  BP medication side effects: no Duration of hyperlipidemia: chronic Cholesterol medication side effects: no Cholesterol supplements: none Medication compliance: good compliance Aspirin: yes Recent stressors: no Recurrent headaches: no Visual changes: no Palpitations: no Dyspnea: no Chest pain: no Lower extremity edema: no Dizzy/lightheaded: no    ASTHMA Uses Advair every day, then Albuterol as needed.   Asthma status: stable Satisfied with current treatment?: yes Albuterol/rescue inhaler frequency: not using Dyspnea frequency: none Wheezing frequency: none Cough frequency: none Nocturnal symptom frequency:  Limitation of activity: no Current upper respiratory symptoms: no Triggers: seasonal Home peak flows:none Last Spirometry: unknown Failed/intolerant to following asthma meds: none Asthma meds in past: as  above Aerochamber/spacer use: no Visits to ER or Urgent Care in past year: no Pneumovax: 10/24/2012 Influenza: Up to Date   INSOMNIA Currently is taking Melatonin, as instructed last visit, taking 10 MG and using CPAP which has helped.  Sleeping has much improved now. Duration: months Satisfied with sleep quality: no Difficulty falling asleep: no Difficulty staying asleep: none Waking a few hours after sleep onset: none Early morning awakenings: none Daytime hypersomnolence: none Wakes feeling refreshed: no Good sleep hygiene: yes Apnea: yes Snoring: yes Depressed/anxious mood: no Recent stress: no Restless legs/nocturnal leg cramps: no Chronic pain/arthritis: no History of sleep study: yes Treatments attempted: none   Relevant past medical, surgical, family and social history reviewed and updated as indicated. Interim medical history since our last visit reviewed. Allergies and medications reviewed and updated.  Review of Systems  Constitutional:  Negative for activity change, appetite change, diaphoresis, fatigue and fever.  Respiratory:  Negative for cough, chest tightness and shortness of breath.   Cardiovascular:  Negative for chest pain, palpitations and leg swelling.  Gastrointestinal: Negative.   Neurological:  Negative for dizziness, syncope, speech difficulty, weakness, light-headedness, numbness and headaches.  Psychiatric/Behavioral: Negative.     Per HPI unless specifically indicated above     Objective:    BP 128/80 (BP Location: Left Arm, Patient Position: Sitting)   Pulse 61   Temp 98.6 F (37 C) (Oral)   Wt 230 lb 6.4 oz (104.5 kg)   SpO2 97%   BMI 42.14 kg/m   Wt Readings from Last 3 Encounters:  11/22/20 230 lb 6.4 oz (104.5 kg)  11/01/20 223 lb (101.2 kg)  10/22/20 228 lb (103.4 kg)    Physical Exam Vitals and nursing note reviewed.  Constitutional:      General: She is awake. She is not in acute distress.    Appearance: She is  well-developed. She is obese. She is not ill-appearing.  HENT:     Head: Normocephalic.     Right Ear: Hearing, ear canal and external ear normal. No drainage.     Left Ear: Hearing, ear canal and external ear normal. No drainage.  Eyes:     General: Lids are normal.        Right eye: No discharge.        Left eye: No discharge.     Conjunctiva/sclera: Conjunctivae normal.     Pupils: Pupils are equal, round, and reactive to light.  Neck:     Thyroid: No thyromegaly.     Vascular: No carotid bruit.  Cardiovascular:     Rate and Rhythm: Normal rate and regular rhythm.     Heart sounds: Murmur heard.  Systolic murmur is present with a grade of 2/6.    No gallop.  Pulmonary:     Effort: Pulmonary effort is normal. No accessory muscle usage or respiratory distress.     Breath sounds: Normal breath sounds.  Abdominal:     General: Bowel sounds are normal.     Palpations: Abdomen is soft.  Musculoskeletal:     Right hand: Normal.     Left hand: Normal.     Cervical back: Normal range of motion and neck supple.     Right lower leg: No edema.     Left lower leg: No edema.  Skin:    General: Skin is warm and dry.  Neurological:     Mental Status: She is alert and oriented to person, place, and time.  Psychiatric:        Attention and Perception: Attention normal.        Mood and Affect: Mood normal.        Speech: Speech normal.        Behavior: Behavior normal. Behavior is cooperative.        Thought Content: Thought content normal.   Results for orders placed or performed in visit on 11/01/20  Microscopic Examination   Urine  Result Value Ref Range   WBC, UA 6-10 (A) 0 - 5 /hpf   RBC None seen 0 - 2 /hpf   Epithelial Cells (non renal) 0-10 0 - 10 /hpf   Bacteria, UA None seen None seen/Few  CULTURE, URINE COMPREHENSIVE   Specimen: Urine   Urine  Result Value Ref Range   Urine Culture, Comprehensive CANCELED (A)   Urinalysis, Complete  Result Value Ref Range    Specific Gravity, UA 1.020 1.005 - 1.030   pH, UA 7.0 5.0 - 7.5   Color, UA Yellow Yellow   Appearance Ur Clear Clear   Leukocytes,UA 1+ (A) Negative   Protein,UA Negative Negative/Trace   Glucose, UA Negative Negative   Ketones, UA Negative Negative   RBC, UA Negative Negative   Bilirubin, UA Negative Negative   Urobilinogen, Ur 0.2 0.2 - 1.0 mg/dL   Nitrite, UA Negative Negative   Microscopic Examination See below:       Assessment & Plan:   Problem List Items Addressed This Visit       Cardiovascular and Mediastinum   Essential hypertension, benign    Chronic, ongoing with goal BP at home  on readings and on repeat in office today (initial in office elevated, noted at past visits ?white coat).  Continue current medication regimen and adjust as needed.  Continue daily BP checks at home and focus on DASH diet regimen.  Discussed strict goal BP <130/80 due to history of stroke.  Labs today: CMP, CBC, TSH.  Refills sent in.  Return in 6 months.      Relevant Medications   atorvastatin (LIPITOR) 40 MG tablet   lisinopril (ZESTRIL) 5 MG tablet   Other Relevant Orders   Comprehensive metabolic panel   TSH   Aortic atherosclerosis (HCC)    Noted on imaging 10/30/20.  Continue statin daily and ASA for prevention.      Relevant Medications   atorvastatin (LIPITOR) 40 MG tablet   lisinopril (ZESTRIL) 5 MG tablet     Respiratory   Asthma    Chronic, ongoing.  Continue current medication regimen and adjust as needed.  Consider spirometry next visit.  CBC today.      Relevant Medications   albuterol (VENTOLIN HFA) 108 (90 Base) MCG/ACT inhaler   fluticasone-salmeterol (ADVAIR) 250-50 MCG/ACT AEPB   Other Relevant Orders   CBC with Differential/Platelet   Obstructive sleep apnea    Recommend use of CPAP 100% of the time, has benefit from this.        Endocrine   IFG (impaired fasting glucose)    Recheck A1C today, stable last two checks with recent 5.6%. Continues to be  stable.      Relevant Orders   HgB A1c     Other   History of stroke    Much improvement in right upper extremity and right lower extremity strength and function with OT on board.   Praised for dedication to OT and progress.  Continue focus on prevention with BP <130/90, A1c <7%, and LDL <70.  Lipid panel today.      Mixed hyperlipidemia    Chronic, ongoing.  Continue current medication regimen and adjust as needed.  Recent LDL 53, well below neuro goal <70.  Lipid panel today.      Relevant Medications   atorvastatin (LIPITOR) 40 MG tablet   lisinopril (ZESTRIL) 5 MG tablet   Other Relevant Orders   Comprehensive metabolic panel   Lipid Panel w/o Chol/HDL Ratio   Morbid obesity (HCC) - Primary    BMI 42.14 with HTN/HLD, history of CVA.  Recommended eating smaller high protein, low fat meals more frequently and exercising 30 mins a day 5 times a week with a goal of 10-15lb weight loss in the next 3 months. Patient voiced their understanding and motivation to adhere to these recommendations.       Insomnia    Ongoing and improved at this time with Melatonin and CPAP use.   Avoid Benadryl containing products.  Focus on sleep hygiene techniques.  If worsening consider addition of Trazodone.  Return in 6 months.      Other Visit Diagnoses     Flu vaccine need       Flu vaccine today   Relevant Orders   Flu Vaccine QUAD 6+ mos PF IM (Fluarix Quad PF) (Completed)   Need for hepatitis C screening test       Hep C on labs today per recommendations for one time screening, discussed with patient.   Relevant Orders   Hepatitis C antibody        Follow up plan: Return in about 6 months (around 05/23/2021) for HTN/HLD, OSA,  INSOMNIA, IFG, STRESS INCONTINENCE.

## 2020-11-22 NOTE — Assessment & Plan Note (Signed)
Chronic, ongoing with goal BP at home on readings and on repeat in office today (initial in office elevated, noted at past visits ?white coat).  Continue current medication regimen and adjust as needed.  Continue daily BP checks at home and focus on DASH diet regimen.  Discussed strict goal BP <130/80 due to history of stroke.  Labs today: CMP, CBC, TSH.  Refills sent in.  Return in 6 months.

## 2020-11-22 NOTE — Assessment & Plan Note (Signed)
Noted on imaging 10/30/20.  Continue statin daily and ASA for prevention.

## 2020-11-22 NOTE — Assessment & Plan Note (Signed)
Chronic, ongoing.  Continue current medication regimen and adjust as needed.  Recent LDL 53, well below neuro goal <70.  Lipid panel today. 

## 2020-11-22 NOTE — Assessment & Plan Note (Signed)
Chronic, ongoing.  Continue current medication regimen and adjust as needed.  Consider spirometry next visit.  CBC today.

## 2020-11-22 NOTE — Assessment & Plan Note (Signed)
Ongoing and improved at this time with Melatonin and CPAP use.   Avoid Benadryl containing products.  Focus on sleep hygiene techniques.  If worsening consider addition of Trazodone.  Return in 6 months. 

## 2020-11-22 NOTE — Assessment & Plan Note (Signed)
Recommend use of CPAP 100% of the time, has benefit from this. 

## 2020-11-22 NOTE — Assessment & Plan Note (Signed)
BMI 42.14 with HTN/HLD, history of CVA.  Recommended eating smaller high protein, low fat meals more frequently and exercising 30 mins a day 5 times a week with a goal of 10-15lb weight loss in the next 3 months. Patient voiced their understanding and motivation to adhere to these recommendations.

## 2020-11-22 NOTE — Patient Instructions (Signed)

## 2020-11-22 NOTE — Assessment & Plan Note (Signed)
Much improvement in right upper extremity and right lower extremity strength and function with OT on board.   Praised for dedication to OT and progress.  Continue focus on prevention with BP <130/90, A1c <7%, and LDL <70.  Lipid panel today.

## 2020-11-22 NOTE — Assessment & Plan Note (Signed)
Recheck A1C today, stable last two checks with recent 5.6%. Continues to be stable.

## 2020-11-23 LAB — COMPREHENSIVE METABOLIC PANEL
ALT: 19 IU/L (ref 0–32)
AST: 18 IU/L (ref 0–40)
Albumin/Globulin Ratio: 1.6 (ref 1.2–2.2)
Albumin: 3.9 g/dL (ref 3.8–4.8)
Alkaline Phosphatase: 120 IU/L (ref 44–121)
BUN/Creatinine Ratio: 16 (ref 12–28)
BUN: 14 mg/dL (ref 8–27)
Bilirubin Total: 0.4 mg/dL (ref 0.0–1.2)
CO2: 23 mmol/L (ref 20–29)
Calcium: 9.3 mg/dL (ref 8.7–10.3)
Chloride: 106 mmol/L (ref 96–106)
Creatinine, Ser: 0.89 mg/dL (ref 0.57–1.00)
Globulin, Total: 2.4 g/dL (ref 1.5–4.5)
Glucose: 83 mg/dL (ref 70–99)
Potassium: 4.4 mmol/L (ref 3.5–5.2)
Sodium: 142 mmol/L (ref 134–144)
Total Protein: 6.3 g/dL (ref 6.0–8.5)
eGFR: 72 mL/min/{1.73_m2} (ref >59)

## 2020-11-23 LAB — CBC WITH DIFFERENTIAL/PLATELET
Basophils Absolute: 0.1 10*3/uL (ref 0.0–0.2)
Basos: 1 %
EOS (ABSOLUTE): 0.1 10*3/uL (ref 0.0–0.4)
Eos: 3 %
Hematocrit: 42.1 % (ref 34.0–46.6)
Hemoglobin: 13.6 g/dL (ref 11.1–15.9)
Immature Grans (Abs): 0 10*3/uL (ref 0.0–0.1)
Immature Granulocytes: 0 %
Lymphocytes Absolute: 1.4 10*3/uL (ref 0.7–3.1)
Lymphs: 24 %
MCH: 28 pg (ref 26.6–33.0)
MCHC: 32.3 g/dL (ref 31.5–35.7)
MCV: 87 fL (ref 79–97)
Monocytes Absolute: 0.4 10*3/uL (ref 0.1–0.9)
Monocytes: 8 %
Neutrophils Absolute: 3.6 10*3/uL (ref 1.4–7.0)
Neutrophils: 64 %
Platelets: 273 10*3/uL (ref 150–450)
RBC: 4.86 x10E6/uL (ref 3.77–5.28)
RDW: 13.3 % (ref 11.7–15.4)
WBC: 5.6 10*3/uL (ref 3.4–10.8)

## 2020-11-23 LAB — LIPID PANEL W/O CHOL/HDL RATIO
Cholesterol, Total: 122 mg/dL (ref 100–199)
HDL: 66 mg/dL (ref >39)
LDL Chol Calc (NIH): 45 mg/dL (ref 0–99)
Triglycerides: 46 mg/dL (ref 0–149)
VLDL Cholesterol Cal: 11 mg/dL (ref 5–40)

## 2020-11-23 LAB — HEMOGLOBIN A1C
Est. average glucose Bld gHb Est-mCnc: 120 mg/dL
Hgb A1c MFr Bld: 5.8 % — ABNORMAL HIGH (ref 4.8–5.6)

## 2020-11-23 LAB — HEPATITIS C ANTIBODY: Hep C Virus Ab: <0.1 s/co ratio (ref 0.0–0.9)

## 2020-11-23 LAB — TSH: TSH: 1.04 u[IU]/mL (ref 0.450–4.500)

## 2020-11-24 NOTE — Progress Notes (Signed)
Contacted via MyChart   Good morning Kristin Solis, your labs have returned.  Everything continues to look fantastic, continue all current medications.  At stroke prevention goal on cholesterol levels.  The A1C is the diabetes testing we talked about, this looks at your blood sugars over the past 3 months and turns the average into a number.  Your number is 5.8%, meaning you are prediabetic.  Any number 5.7 to 6.4 is considered prediabetes and any number 6.5 or greater is considered diabetes.   I would recommend heavy focus on decreasing foods high in sugar and your intake of things like bread products, pasta, and rice.  The American Diabetes Association online has a large amount of information on diet changes to make.  We will recheck this number in 3-6 months to ensure you are not continuing to trend upwards and move into diabetes.  Hope your weekend has been wonderful!!  Any questions? Keep being amazing!!  Thank you for allowing me to participate in your care.  I appreciate you. Kindest regards, Ryin Schillo

## 2020-12-06 DIAGNOSIS — G4701 Insomnia due to medical condition: Secondary | ICD-10-CM | POA: Diagnosis not present

## 2020-12-06 DIAGNOSIS — R32 Unspecified urinary incontinence: Secondary | ICD-10-CM | POA: Diagnosis not present

## 2020-12-09 DIAGNOSIS — G4733 Obstructive sleep apnea (adult) (pediatric): Secondary | ICD-10-CM | POA: Diagnosis not present

## 2020-12-09 DIAGNOSIS — G4701 Insomnia due to medical condition: Secondary | ICD-10-CM | POA: Diagnosis not present

## 2021-03-27 ENCOUNTER — Other Ambulatory Visit: Payer: Self-pay | Admitting: Nurse Practitioner

## 2021-03-27 NOTE — Telephone Encounter (Signed)
Requested Prescriptions  Pending Prescriptions Disp Refills   albuterol (VENTOLIN HFA) 108 (90 Base) MCG/ACT inhaler [Pharmacy Med Name: Albuterol Sulfate HFA 108 (90 Base) MCG/ACT Inhalation Aerosol Solution] 9 g 0    Sig: INHALE 2 PUFFS BY MOUTH EVERY 6 HOURS AS NEEDED FOR WHEEZING FOR SHORTNESS OF BREATH     Pulmonology:  [facility] 2 Passed - 03/27/2021  9:17 AM      Passed - Last BP in normal range    BP Readings from Last 1 Encounters:  11/22/20 128/80         Passed - Last Heart Rate in normal range    Pulse Readings from Last 1 Encounters:  11/22/20 61         Passed - Valid encounter within last 12 months    Recent Outpatient Visits          4 months ago Morbid obesity (HCC)   [facility] [place], Sachse T, NP   5 months ago Acute right flank pain   [facility] [place], Strong T, NP   5 months ago Acute right flank pain   [facility] [place], [place] T, NP   10 months ago Morbid obesity (HCC)   [facility] [name], [name], NP   11 months ago Morbid obesity (HCC)   [facility] [place], [name], NP      Future Appointments            In 1 month Stoioff, Verna Czech, MD [facility]   In 1 month [place], [name], NP [facility]

## 2021-05-01 ENCOUNTER — Ambulatory Visit (INDEPENDENT_AMBULATORY_CARE_PROVIDER_SITE_OTHER): Payer: Medicare Other | Admitting: Urology

## 2021-05-01 ENCOUNTER — Other Ambulatory Visit: Payer: Self-pay

## 2021-05-01 ENCOUNTER — Ambulatory Visit
Admission: RE | Admit: 2021-05-01 | Discharge: 2021-05-01 | Disposition: A | Discharge status: Home / Self Care | Payer: Medicare Other | Attending: Urology | Admitting: Urology

## 2021-05-01 ENCOUNTER — Ambulatory Visit
Admission: RE | Admit: 2021-05-01 | Discharge: 2021-05-01 | Disposition: A | Discharge status: Home / Self Care | Payer: Medicare Other | Source: Ambulatory Visit | Attending: Urology | Admitting: Urology

## 2021-05-01 ENCOUNTER — Encounter: Payer: Self-pay | Admitting: Urology

## 2021-05-01 VITALS — BP 136/81 | HR 88 | Ht 62.0 in | Wt 237.0 lb

## 2021-05-01 DIAGNOSIS — N200 Calculus of kidney: Secondary | ICD-10-CM | POA: Insufficient documentation

## 2021-05-01 NOTE — Progress Notes (Signed)
? ?05/01/2021 ?1:08 PM  ? ?[name] ?April 15, 1955 ?[id number] ? ?Referring provider: [name], NP ?[address] [place],  [place] [address] ? ?Chief Complaint  ?Patient presents with  ? Nephrolithiasis  ? ? ?HPI: 66 y.o. female presents for 35-month follow-up visit. ? ?Initial visit 11/01/2020 with complaints of right flank pain ?CT showed bilateral, nonobstructing renal calculi ?She was treated for a UTI though has chronic asymptomatic bacteriuria ?It was felt she had probably passed a small stone ?Since her last visit she has been doing well and denies recurrent flank pain ? ? ?PMH: ?Past Medical History:  ?Diagnosis Date  ? Asthma   ? Hypertension   ? Stroke [facility])   ? ? ?Surgical History: ?Past Surgical History:  ?Procedure Laterality Date  ? IR CT HEAD LTD  04/18/2018  ? IR INTRAVSC STENT CERV CAROTID W/O EMB-PROT MOD SED INC ANGIO  04/18/2018  ? IR PERCUTANEOUS ART THROMBECTOMY/INFUSION INTRACRANIAL INC DIAG ANGIO  04/18/2018  ? OOPHORECTOMY    ? RADIOLOGY WITH ANESTHESIA N/A 04/17/2018  ? Procedure: RADIOLOGY WITH ANESTHESIA;  Surgeon: Luanne Bras, MD;  Location: [place];  Service: Radiology;  Laterality: N/A;  ? TOTAL ABDOMINAL HYSTERECTOMY W/ BILATERAL SALPINGOOPHORECTOMY  2005  ? total  ? ? ?Home Medications:  ?Allergies as of 05/01/2021   ?No Known Allergies ?  ? ?  ?Medication List  ?  ? ?  ? Accurate as of May 01, 2021  1:08 PM. If you have any questions, ask your nurse or doctor.  ?  ?  ? ?  ? ?albuterol 108 (90 Base) MCG/ACT inhaler ?Commonly known as: VENTOLIN HFA ?INHALE 2 PUFFS BY MOUTH EVERY 6 HOURS AS NEEDED FOR WHEEZING FOR SHORTNESS OF BREATH ?  ?aspirin 81 MG EC tablet ?Take 1 tablet (81 mg total) by mouth daily. ?  ?atorvastatin 40 MG tablet ?Commonly known as: LIPITOR ?Take 1 tablet (40 mg total) by mouth daily at 6 PM. ?  ?fluticasone 50 MCG/ACT nasal spray ?Commonly known as: FLONASE ?Use 2 spray(s) in each nostril once daily ?  ?fluticasone-salmeterol 250-50 MCG/ACT  Aepb ?Commonly known as: ADVAIR ?Inhale 1 puff into the lungs in the morning and at bedtime. ?  ?lisinopril 5 MG tablet ?Commonly known as: ZESTRIL ?Take 1 tablet (5 mg total) by mouth daily. ?  ? ?  ? ? ?Allergies: No Known Allergies ? ?Family History: ?Family History  ?Problem Relation Age of Onset  ? Cancer Mother   ?     lung  ? Cancer Father   ?     brain  ? Diabetes Sister   ? Alcohol abuse Brother   ? Stroke Son   ?     x2  ? CAD Maternal Grandmother   ? Emphysema Paternal Grandfather   ? Heart disease Sister   ?     CHF  ? Breast cancer Neg Hx   ? ? ?Social History:  reports that she has never smoked. She has never used smokeless tobacco. She reports that she does not drink alcohol and does not use drugs. ? ? ?Physical Exam: ?BP 136/81   Pulse 88   Ht 5\' 2"  (1.575 m)   Wt 237 lb (107.5 kg)   BMI 43.35 kg/m?   ?Constitutional:  Alert and oriented, No acute distress. ?Cardiovascular: No clubbing, cyanosis, or edema. ?Respiratory: Normal respiratory effort, no increased work of breathing. ?Psychiatric: Normal mood and affect. ? ? ?Pertinent Imaging: ?The images of a KUB performed today were  personally reviewed and interpreted.  Stable right lower pole calculus.  There is calcification near the L4 transverse process consistent with a phlebolith seen on prior CT ? ? ?Assessment & Plan:   ? ?1.  Nephrolithiasis ?Stable right lower pole renal calculus.  The additional renal calculi seen on CT are small and would not expect to see on a KUB ?1 year follow-up with repeat KUB.  Instructed call earlier for recurrent renal colic ? ? ?Abbie Sons, MD ? ?[place] ?[address] ?[place], [place] [address] ?[phone] ? ?

## 2021-05-07 DIAGNOSIS — G4733 Obstructive sleep apnea (adult) (pediatric): Secondary | ICD-10-CM | POA: Diagnosis not present

## 2021-05-17 NOTE — Patient Instructions (Addendum)
Kegel Exercises ?Kegel exercises can help strengthen your pelvic floor muscles. The pelvic floor is a group of muscles that support your rectum, small intestine, and bladder. In females, pelvic floor muscles also help support the uterus. These muscles help you control the flow of urine and stool (feces). ?Kegel exercises are painless and simple. They do not require any equipment. Your provider may suggest Kegel exercises to: ?Improve bladder and bowel control. ?Improve sexual response. ?Improve weak pelvic floor muscles after surgery to remove the uterus (hysterectomy) or after pregnancy, in females. ?Improve weak pelvic floor muscles after prostate gland removal or surgery, in males. ?Kegel exercises involve squeezing your pelvic floor muscles. These are the same muscles you squeeze when you try to stop the flow of urine or keep from passing gas. The exercises can be done while sitting, standing, or lying down, but it is best to vary your position. ?Ask your health care provider which exercises are safe for you. Do exercises exactly as told by your health care provider and adjust them as directed. Do not begin these exercises until told by your health care provider. ?Exercises ?How to do Kegel exercises: ?Squeeze your pelvic floor muscles tight. You should feel a tight lift in your rectal area. If you are a female, you should also feel a tightness in your vaginal area. Keep your stomach, buttocks, and legs relaxed. ?Hold the muscles tight for up to 10 seconds. ?Breathe normally. ?Relax your muscles for up to 10 seconds. ?Repeat as told by your health care provider. ?Repeat this exercise daily as told by your health care provider. Continue to do this exercise for at least 4-6 weeks, or for as long as told by your health care provider. ?You may be referred to a physical therapist who can help you learn more about how to do Kegel exercises. ?Depending on your condition, your health care provider may  recommend: ?Varying how long you squeeze your muscles. ?Doing several sets of exercises every day. ?Doing exercises for several weeks. ?Making Kegel exercises a part of your regular exercise routine. ?This information is not intended to replace advice given to you by your health care provider. Make sure you discuss any questions you have with your health care provider. ?Document Revised: 06/13/2020 Document Reviewed: 06/13/2020 ?Elsevier Patient Education ? 2022 Elsevier Inc. ? ?

## 2021-05-23 ENCOUNTER — Ambulatory Visit (INDEPENDENT_AMBULATORY_CARE_PROVIDER_SITE_OTHER): Payer: Medicare Other | Admitting: Nurse Practitioner

## 2021-05-23 ENCOUNTER — Encounter: Payer: Self-pay | Admitting: Nurse Practitioner

## 2021-05-23 VITALS — BP 130/66 | HR 61 | Temp 98.0°F | Ht 62.0 in | Wt 238.6 lb

## 2021-05-23 DIAGNOSIS — R7301 Impaired fasting glucose: Secondary | ICD-10-CM

## 2021-05-23 DIAGNOSIS — J4540 Moderate persistent asthma, uncomplicated: Secondary | ICD-10-CM | POA: Diagnosis not present

## 2021-05-23 DIAGNOSIS — I700 Atherosclerosis of aorta: Secondary | ICD-10-CM | POA: Diagnosis not present

## 2021-05-23 DIAGNOSIS — N393 Stress incontinence (female) (male): Secondary | ICD-10-CM

## 2021-05-23 DIAGNOSIS — W19XXXA Unspecified fall, initial encounter: Secondary | ICD-10-CM

## 2021-05-23 DIAGNOSIS — E782 Mixed hyperlipidemia: Secondary | ICD-10-CM

## 2021-05-23 DIAGNOSIS — F5101 Primary insomnia: Secondary | ICD-10-CM | POA: Diagnosis not present

## 2021-05-23 DIAGNOSIS — G4733 Obstructive sleep apnea (adult) (pediatric): Secondary | ICD-10-CM | POA: Diagnosis not present

## 2021-05-23 DIAGNOSIS — I10 Essential (primary) hypertension: Secondary | ICD-10-CM | POA: Diagnosis not present

## 2021-05-23 DIAGNOSIS — R011 Cardiac murmur, unspecified: Secondary | ICD-10-CM

## 2021-05-23 DIAGNOSIS — Z8673 Personal history of transient ischemic attack (TIA), and cerebral infarction without residual deficits: Secondary | ICD-10-CM | POA: Diagnosis not present

## 2021-05-23 LAB — BAYER DCA HB A1C WAIVED: HB A1C (BAYER DCA - WAIVED): 5.2 % (ref 4.8–5.6)

## 2021-05-23 LAB — MICROALBUMIN, URINE WAIVED
Creatinine, Urine Waived: 200 mg/dL (ref 10–300)
Microalb, Ur Waived: 30 mg/L — ABNORMAL HIGH (ref 0–19)
Microalb/Creat Ratio: <30 mg/g (ref <30)

## 2021-05-23 NOTE — Assessment & Plan Note (Addendum)
Improved right upper extremity and right lower extremity strength and function with OT on board.   Praised for dedication to OT and progress.  Continue focus on prevention with BP <130/90, A1c <7%, and LDL <70.  Lipid panel today. 

## 2021-05-23 NOTE — Assessment & Plan Note (Signed)
Recheck A1C today, stable last two checks with today 5.2%. Continues to be stable. ?

## 2021-05-23 NOTE — Assessment & Plan Note (Signed)
Ongoing and improved at this time with Melatonin and CPAP use.   Avoid Benadryl containing products.  Focus on sleep hygiene techniques.  If worsening consider addition of Trazodone.  Return in 6 months. 

## 2021-05-23 NOTE — Assessment & Plan Note (Signed)
Chronic, ongoing.  Continue current medication regimen and adjust as needed.  Recent LDL at goal, well below neuro goal <70.  Lipid panel today. ?

## 2021-05-23 NOTE — Assessment & Plan Note (Signed)
Noted on imaging 10/30/20.  Continue statin daily and ASA for prevention. 

## 2021-05-23 NOTE — Assessment & Plan Note (Signed)
Systolic 2/6.  No symptoms.  Consider echo in future. 

## 2021-05-23 NOTE — Assessment & Plan Note (Signed)
Chronic, ongoing.  Continue current medication regimen and adjust as needed.  Consider spirometry next visit.   

## 2021-05-23 NOTE — Assessment & Plan Note (Signed)
Continue daily use of pads as needed, consider PT for pelvic floor if worsening.  Recommend focus on Kegel exercises and pelvic floor strengthening. ?

## 2021-05-23 NOTE — Progress Notes (Addendum)
? ?BP 130/66 (BP Location: Left Arm, Cuff Size: Normal)   Pulse 61   Temp 98 ?F (36.7 ?C) (Oral)   Ht 5\' 2"  (1.575 m)   Wt 238 lb 9.6 oz (108.2 kg)   SpO2 99%   BMI 43.64 kg/m?   ? ?Subjective:  ? ? Patient ID: Kristin Solis, female    DOB: [birth year], 66 y.o.   MRN: [id number] ? ?HPI: ?Kristin Solis is a 66 y.o. female ? ?Chief Complaint  ?Patient presents with  ? Hypertension  ? Hyperlipidemia  ? Sleep Apnea  ? IFG  ? Insomnia  ? Stress Incontinence  ? Fall  ?  Patient states about a week or two ago, she fell, but she did not hurt herself. Patient states she was outside and she decided this day she was not going to use the banister or pole when she was going inside and she fell. Patient states she fell on her buttock, and then she noticed some pain or discomfort in her R leg. Patient states the pain has not subsided. Patient denies having any bruising.   ? ?Fell two weeks ago, did not hurt herself.  She was outside on the porch, decided not to use banister, landed on her bottom and had left leg pain for 3-4 days before it subsided -- rubbed it down in Icy/Hot which improved.  No bruising and no further pain.  ? ?HYPERTENSION / HYPERLIPIDEMIA ?Continues on Lisinopril 5 MG, ASA, and Atorvastatin 40 MG daily.  Using CPAP nightly, 100%, has only forgotten once.   ? ?Has history of CVA March 2020 and is followed by neurology, Dr. Leonie Man, saw in October 2021.  Has had OT in past and had improved ROM with this.  Aortic atherosclerosis noted on imaging 10/30/20 ?  ?History of elevated glucose on labs, A1c 5.8% last visit. Denies any symptoms at this time. ?Satisfied with current treatment? yes ?Duration of hypertension: chronic ?BP monitoring frequency: daily ?BP range: 129/60 recent check ?BP medication side effects: no ?Duration of hyperlipidemia: chronic ?Cholesterol medication side effects: no ?Cholesterol supplements: none ?Medication compliance: good compliance ?Aspirin: yes ?Recent stressors: no ?Recurrent  headaches: no ?Visual changes: no ?Palpitations: no ?Dyspnea: no ?Chest pain: no ?Lower extremity edema: no ?Dizzy/lightheaded: no  ?  ?ASTHMA ?Uses Advair every day, then Albuterol as needed.   ?Asthma status: stable ?Satisfied with current treatment?: yes ?Albuterol/rescue inhaler frequency: every 6 months ?Dyspnea frequency: none ?Wheezing frequency: none ?Cough frequency: none ?Nocturnal symptom frequency: none ?Limitation of activity: no ?Current upper respiratory symptoms: no ?Triggers: seasonal ?Home peak flows:none ?Last Spirometry: unknown ?Failed/intolerant to following asthma meds: none ?Asthma meds in past: as above ?Aerochamber/spacer use: no ?Visits to ER or Urgent Care in past year: no ?Pneumovax: 10/24/2012 ?Influenza: Up to Date  ? ?INSOMNIA ?Currently Melatonin taking 10 MG and using CPAP which has helped.   ?Duration: months ?Satisfied with sleep quality: no ?Difficulty falling asleep: no ?Difficulty staying asleep: none ?Waking a few hours after sleep onset: none ?Early morning awakenings: none ?Daytime hypersomnolence: none ?Wakes feeling refreshed: no ?Good sleep hygiene: yes ?Apnea: yes ?Snoring: yes ?Depressed/anxious mood: no ?Recent stress: no ?Restless legs/nocturnal leg cramps: no ?Chronic pain/arthritis: no ?History of sleep study: yes ?Treatments attempted: none  ? ?STRESS INCONTINENCE ?Sees urology, last saw 05/01/21 for kidney stones.  Has not told him about incontinence issues.  Notices dribbling most with coughing and sneezing.  She feels this is worsening, not consistent.  Does wear a pad for this. ?  Dysuria: no ?Urinary frequency: no ?Urgency: no ?Small volume voids: no ?Symptom severity: no ?Urinary incontinence: no ?Foul odor: no ?Hematuria: no ?Abdominal pain: no ?Back pain: no ?Suprapubic pain/pressure: no ?Flank pain: no ? ?Relevant past medical, surgical, family and social history reviewed and updated as indicated. Interim medical history since our last visit  reviewed. ?Allergies and medications reviewed and updated. ? ?Review of Systems  ?Constitutional:  Negative for activity change, appetite change, diaphoresis, fatigue and fever.  ?Respiratory:  Negative for cough, chest tightness and shortness of breath.   ?Cardiovascular:  Negative for chest pain, palpitations and leg swelling.  ?Gastrointestinal: Negative.   ?Neurological:  Negative for dizziness, syncope, speech difficulty, weakness, light-headedness, numbness and headaches.  ?Psychiatric/Behavioral: Negative.    ? ?Per HPI unless specifically indicated above ? ?   ?Objective:  ?  ?BP 130/66 (BP Location: Left Arm, Cuff Size: Normal)   Pulse 61   Temp 98 ?F (36.7 ?C) (Oral)   Ht 5\' 2"  (1.575 m)   Wt 238 lb 9.6 oz (108.2 kg)   SpO2 99%   BMI 43.64 kg/m?   ?Wt Readings from Last 3 Encounters:  ?05/23/21 238 lb 9.6 oz (108.2 kg)  ?05/01/21 237 lb (107.5 kg)  ?11/22/20 230 lb 6.4 oz (104.5 kg)  ?  ?Physical Exam ?Vitals and nursing note reviewed.  ?Constitutional:   ?   General: She is awake. She is not in acute distress. ?   Appearance: She is well-developed. She is obese. She is not ill-appearing.  ?HENT:  ?   Head: Normocephalic.  ?   Right Ear: Hearing, ear canal and external ear normal. No drainage.  ?   Left Ear: Hearing, ear canal and external ear normal. No drainage.  ?Eyes:  ?   General: Lids are normal.     ?   Right eye: No discharge.     ?   Left eye: No discharge.  ?   Conjunctiva/sclera: Conjunctivae normal.  ?   Pupils: Pupils are equal, round, and reactive to light.  ?Neck:  ?   Thyroid: No thyromegaly.  ?   Vascular: No carotid bruit.  ?Cardiovascular:  ?   Rate and Rhythm: Normal rate and regular rhythm.  ?   Heart sounds: Murmur heard.  ?Systolic murmur is present with a grade of 2/6.  ?  No gallop.  ?Pulmonary:  ?   Effort: Pulmonary effort is normal. No accessory muscle usage or respiratory distress.  ?   Breath sounds: Normal breath sounds.  ?Abdominal:  ?   General: Bowel sounds are  normal.  ?   Palpations: Abdomen is soft.  ?Musculoskeletal:  ?   Right hand: Normal.  ?   Left hand: Normal.  ?   Cervical back: Normal range of motion and neck supple.  ?   Right lower leg: No edema.  ?   Left lower leg: No edema.  ?Skin: ?   General: Skin is warm and dry.  ?Neurological:  ?   Mental Status: She is alert and oriented to person, place, and time.  ?Psychiatric:     ?   Attention and Perception: Attention normal.     ?   Mood and Affect: Mood normal.     ?   Speech: Speech normal.     ?   Behavior: Behavior normal. Behavior is cooperative.     ?   Thought Content: Thought content normal.  ? ?Results for orders placed or performed in visit on 05/23/21  ?  Bayer DCA Hb A1c Waived  ?Result Value Ref Range  ? HB A1C (BAYER DCA - WAIVED) 5.2 4.8 - 5.6 %  ?Microalbumin, Urine Waived  ?Result Value Ref Range  ? Microalb, Ur Waived 30 (H) 0 - 19 mg/L  ? Creatinine, Urine Waived 200 10 - 300 mg/dL  ? Microalb/Creat Ratio <30 <30 mg/g  ? ?   ?Assessment & Plan:  ? ?Problem List Items Addressed This Visit   ? ?  ? Cardiovascular and Mediastinum  ? Aortic atherosclerosis ([place]) - Primary  ?  Noted on imaging 10/30/20.  Continue statin daily and ASA for prevention. ?  ?  ? Relevant Orders  ? Comprehensive metabolic panel  ? Lipid Panel w/o Chol/HDL Ratio  ? Essential hypertension, benign  ?  Chronic, ongoing with goal BP at home on readings and in office today.  Continue current medication regimen and adjust as needed.  Continue daily BP checks at home and focus on DASH diet regimen.  Discussed strict goal BP <130/90 due to history of stroke.  Labs today: CMP and urine ALB.  Refills sent up to date. Return in 6 months. ?  ?  ? Relevant Orders  ? Comprehensive metabolic panel  ? Microalbumin, Urine Waived (Completed)  ?  ? Respiratory  ? Asthma  ?  Chronic, ongoing.  Continue current medication regimen and adjust as needed.  Consider spirometry next visit.   ?  ?  ? Obstructive sleep apnea  ?  Recommend use of CPAP  100% of the time, has benefit from this. ?  ?  ?  ? Endocrine  ? IFG (impaired fasting glucose)  ?  Recheck A1C today, stable last two checks with today 5.2%. Continues to be stable. ?  ?  ? Relevant Orders  ? Bayer DCA Hb A1c Waived (Comp

## 2021-05-23 NOTE — Assessment & Plan Note (Signed)
Recommend use of CPAP 100% of the time, has benefit from this. 

## 2021-05-23 NOTE — Assessment & Plan Note (Signed)
Chronic, ongoing with goal BP at home on readings and in office today.  Continue current medication regimen and adjust as needed.  Continue daily BP checks at home and focus on DASH diet regimen.  Discussed strict goal BP <130/90 due to history of stroke.  Labs today: CMP and urine ALB.  Refills sent up to date. Return in 6 months. ?

## 2021-05-23 NOTE — Assessment & Plan Note (Signed)
BMI 43.64 with HTN/HLD, history of CVA.  Recommended eating smaller high protein, low fat meals more frequently and exercising 30 mins a day 5 times a week with a goal of 10-15lb weight loss in the next 3 months. Patient voiced their understanding and motivation to adhere to these recommendations. ? ?

## 2021-05-24 LAB — COMPREHENSIVE METABOLIC PANEL
ALT: 21 IU/L (ref 0–32)
AST: 19 IU/L (ref 0–40)
Albumin/Globulin Ratio: 1.7 (ref 1.2–2.2)
Albumin: 4 g/dL (ref 3.8–4.8)
Alkaline Phosphatase: 128 IU/L — ABNORMAL HIGH (ref 44–121)
BUN/Creatinine Ratio: 19 (ref 12–28)
BUN: 18 mg/dL (ref 8–27)
Bilirubin Total: 0.4 mg/dL (ref 0.0–1.2)
CO2: 23 mmol/L (ref 20–29)
Calcium: 9.5 mg/dL (ref 8.7–10.3)
Chloride: 106 mmol/L (ref 96–106)
Creatinine, Ser: 0.93 mg/dL (ref 0.57–1.00)
Globulin, Total: 2.3 g/dL (ref 1.5–4.5)
Glucose: 92 mg/dL (ref 70–99)
Potassium: 4.4 mmol/L (ref 3.5–5.2)
Sodium: 144 mmol/L (ref 134–144)
Total Protein: 6.3 g/dL (ref 6.0–8.5)
eGFR: 69 mL/min/{1.73_m2} (ref >59)

## 2021-05-24 LAB — LIPID PANEL W/O CHOL/HDL RATIO
Cholesterol, Total: 130 mg/dL (ref 100–199)
HDL: 73 mg/dL (ref >39)
LDL Chol Calc (NIH): 46 mg/dL (ref 0–99)
Triglycerides: 49 mg/dL (ref 0–149)
VLDL Cholesterol Cal: 11 mg/dL (ref 5–40)

## 2021-05-24 NOTE — Progress Notes (Signed)
Contacted via [place] ? ? ?Good morning Kristin Solis, your labs have returned and overall remain nice and stable.  No medication changes needed!!  [place] job!! ?Keep being amazing!!  Thank you for allowing me to participate in your care.  I appreciate you. ?Kindest regards, ?Omeed Osuna ?

## 2021-07-02 ENCOUNTER — Other Ambulatory Visit: Payer: Self-pay | Admitting: Nurse Practitioner

## 2021-07-02 NOTE — Telephone Encounter (Signed)
Requested Prescriptions  ?Pending Prescriptions Disp Refills  ?? albuterol (VENTOLIN HFA) 108 (90 Base) MCG/ACT inhaler [Pharmacy Med Name: Albuterol Sulfate HFA 108 (90 Base) MCG/ACT Inhalation Aerosol Solution] 9 g 1  ?  Sig: INHALE 2 PUFFS BY MOUTH EVERY 6 HOURS AS NEEDED FOR WHEEZING OR SHORTNESS OF BREATH  ?  ? Pulmonology:  Beta Agonists 2 Passed - 07/02/2021 10:55 AM  ?  ?  Passed - Last BP in normal range  ?  BP Readings from Last 1 Encounters:  ?05/23/21 130/66  ?   ?  ?  Passed - Last Heart Rate in normal range  ?  Pulse Readings from Last 1 Encounters:  ?05/23/21 61  ?   ?  ?  Passed - Valid encounter within last 12 months  ?  Recent Outpatient Visits   ?      ? 1 month ago Aortic atherosclerosis (HCC)  ? [facility] [place], [place] T, NP  ? 7 months ago Morbid obesity (HCC)  ? [facility] [place], [place] T, NP  ? 8 months ago Acute right flank pain  ? [facility] [place], [place] T, NP  ? 8 months ago Acute right flank pain  ? [facility] [place], Corrie Dandy T, NP  ? 1 year ago Morbid obesity (HCC)  ? [facility] [place], Corrie Dandy T, NP  ?  ?  ?Future Appointments   ?        ? In 4 months [name], [name], NP [facility]  ?  ? ?  ?  ?  ?Refused Prescriptions Disp Refills  ?? lisinopril (ZESTRIL) 5 MG tablet [Pharmacy Med Name: Lisinopril 5 MG Oral Tablet] 90 tablet 0  ?  Sig: Take 1 tablet by mouth once daily  ?  ? Cardiovascular:  ACE Inhibitors Passed - 07/02/2021 10:55 AM  ?  ?  Passed - Cr in normal range and within 180 days  ?  Creatinine  ?Date Value Ref Range Status  ?07/14/2012 0.94 0.60 - 1.30 mg/dL Final  ? ?Creatinine, Ser  ?Date Value Ref Range Status  ?05/23/2021 0.93 0.57 - 1.00 mg/dL Final  ?   ?  ?  Passed - K in normal range and within 180 days  ?  Potassium  ?Date Value Ref Range Status  ?05/23/2021 4.4 3.5 - 5.2 mmol/L Final  ?07/14/2012 3.5 3.5 - 5.1 mmol/L Final  ?   ?  ?  Passed - Patient is not pregnant  ?   ?  Passed - Last BP in normal range  ?  BP Readings from Last 1 Encounters:  ?05/23/21 130/66  ?   ?  ?  Passed - Valid encounter within last 6 months  ?  Recent Outpatient Visits   ?      ? 1 month ago Aortic atherosclerosis (HCC)  ? [facility] [place], [place] T, NP  ? 7 months ago Morbid obesity (HCC)  ? [facility] [place], [place] T, NP  ? 8 months ago Acute right flank pain  ? [facility] [place], [place] T, NP  ? 8 months ago Acute right flank pain  ? [facility] [place], Corrie Dandy T, NP  ? 1 year ago Morbid obesity (HCC)  ? [facility] [place], Corrie Dandy T, NP  ?  ?  ?Future Appointments   ?        ? In 4 months [name], [name], NP [facility]  ?  ? ?  ?  ?  ? ?

## 2021-07-07 ENCOUNTER — Encounter: Payer: Self-pay | Admitting: Physician Assistant

## 2021-07-07 ENCOUNTER — Ambulatory Visit (INDEPENDENT_AMBULATORY_CARE_PROVIDER_SITE_OTHER): Payer: Medicare Other | Admitting: Physician Assistant

## 2021-07-07 ENCOUNTER — Ambulatory Visit: Payer: Self-pay

## 2021-07-07 VITALS — BP 166/89 | HR 62 | Temp 97.6°F | Ht 62.01 in | Wt 243.6 lb

## 2021-07-07 DIAGNOSIS — W57XXXA Bitten or stung by nonvenomous insect and other nonvenomous arthropods, initial encounter: Secondary | ICD-10-CM

## 2021-07-07 DIAGNOSIS — S40262A Insect bite (nonvenomous) of left shoulder, initial encounter: Secondary | ICD-10-CM | POA: Diagnosis not present

## 2021-07-07 MED ORDER — DOXYCYCLINE HYCLATE 100 MG PO TABS: 200.0000 mg | ORAL_TABLET | Freq: Once | ORAL | 0 refills | Status: AC

## 2021-07-07 NOTE — Progress Notes (Signed)
Established Patient Office Visit  Name: Kristin Solis   MRN: [id number]    DOB: 1955-08-29   Date:07/07/2021  Today's Provider: Talitha Givens, MHS, PA-C Introduced myself to the patient as a PA-C and provided education on APPs in clinical practice.         Subjective  Chief Complaint  Chief Complaint  Patient presents with   Insect Bite    Patient states that she found a tick yesterday on the back of her left arm. Husband removed tick, patient believes it was not removed all the way.     HPI  Unsure of timing of initial tick bite States she went fishing Thurs and Fri  Noticed it yesterday morning  Her husband told her it was puffy and seemed to have been attached for a while  Her husband tried to remove it but they think the head may still be imbedded    Patient Active Problem List   Diagnosis Date Noted   Heart murmur 05/23/2021   Aortic atherosclerosis ([place]) 10/30/2020   Personal history of kidney stones 10/16/2020   Tinnitus of both ears 04/11/2020   Insomnia 04/11/2020   Stress incontinence 10/09/2019   Morbid obesity ([place]) 08/15/2019   Mixed hyperlipidemia 09/28/2018   History of stroke 04/17/2018   IFG (impaired fasting glucose) 03/28/2018   Essential hypertension, benign 01/15/2017   Asthma 01/15/2017   Obstructive sleep apnea 10/18/2013    Past Surgical History:  Procedure Laterality Date   IR CT HEAD LTD  04/18/2018   IR INTRAVSC STENT CERV CAROTID W/O EMB-PROT MOD SED INC ANGIO  04/18/2018   IR PERCUTANEOUS ART THROMBECTOMY/INFUSION INTRACRANIAL INC DIAG ANGIO  04/18/2018   OOPHORECTOMY     RADIOLOGY WITH ANESTHESIA N/A 04/17/2018   Procedure: RADIOLOGY WITH ANESTHESIA;  Surgeon: Luanne Bras, MD;  Location: [place];  Service: Radiology;  Laterality: N/A;   TOTAL ABDOMINAL HYSTERECTOMY W/ BILATERAL SALPINGOOPHORECTOMY  2005   total    Family History  Problem Relation Age of Onset   Cancer Mother        lung   Cancer Father         brain   Diabetes Sister    Alcohol abuse Brother    Stroke Son        x2   CAD Maternal Grandmother    Emphysema Paternal Grandfather    Heart disease Sister        CHF   Breast cancer Neg Hx     Social History   Tobacco Use   Smoking status: Never   Smokeless tobacco: Never  Substance Use Topics   Alcohol use: No     Current Outpatient Medications:    albuterol (VENTOLIN HFA) 108 (90 Base) MCG/ACT inhaler, INHALE 2 PUFFS BY MOUTH EVERY 6 HOURS AS NEEDED FOR WHEEZING OR SHORTNESS OF BREATH, Disp: 9 g, Rfl: 1   aspirin EC 81 MG EC tablet, Take 1 tablet (81 mg total) by mouth daily., Disp: , Rfl:    atorvastatin (LIPITOR) 40 MG tablet, Take 1 tablet (40 mg total) by mouth daily at 6 PM., Disp: 90 tablet, Rfl: 4   fluticasone (FLONASE) 50 MCG/ACT nasal spray, Use 2 spray(s) in each nostril once daily, Disp: 16 g, Rfl: 4   fluticasone-salmeterol (ADVAIR) 250-50 MCG/ACT AEPB, Inhale 1 puff into the lungs in the morning and at bedtime., Disp: 180 each, Rfl: 4   lisinopril (ZESTRIL) 5 MG tablet, Take 1 tablet (5  mg total) by mouth daily., Disp: 90 tablet, Rfl: 4  No Known Allergies     Review of Systems  Constitutional:  Negative for chills, fever and malaise/fatigue.  HENT:  Negative for congestion, sinus pain and sore throat.   Respiratory:  Negative for cough, shortness of breath and wheezing.   Musculoskeletal:  Negative for myalgias and neck pain.  Skin:  Negative for itching and rash.  Neurological:  Negative for dizziness and headaches.     Objective  Vitals:   07/07/21 1104 07/07/21 1107  BP: (!) 169/73 (!) 166/89  Pulse: 62 62  Temp: 97.6 F (36.4 C)   TempSrc: Oral   SpO2: 97%   Weight: 243 lb 9.6 oz (110.5 kg)   Height: 5' 2.01" (1.575 m)     Body mass index is 44.54 kg/m.  Physical Exam Constitutional:      Appearance: Normal appearance. She is obese.  HENT:     Head: Normocephalic and atraumatic.  Skin:    General: Skin is warm.     Findings:  Wound present.       Neurological:     Mental Status: She is alert.  Psychiatric:        Attention and Perception: Attention normal.        Mood and Affect: Mood normal.        Speech: Speech normal.        Behavior: Behavior normal. Behavior is cooperative.     Recent Results (from the past 2160 hour(s))  Bayer DCA Hb A1c Waived     Status: None   Collection Time: 05/23/21 10:33 AM  Result Value Ref Range   HB A1C (BAYER DCA - WAIVED) 5.2 4.8 - 5.6 %    Comment:          Prediabetes: 5.7 - 6.4          Diabetes: >6.4          Glycemic control for adults with diabetes: <7.0   Microalbumin, Urine Waived     Status: Abnormal   Collection Time: 05/23/21 10:33 AM  Result Value Ref Range   Microalb, Ur Waived 30 (H) 0 - 19 mg/L   Creatinine, Urine Waived 200 10 - 300 mg/dL   Microalb/Creat Ratio <30 <30 mg/g    Comment:                              Abnormal:       30 - 300                         High Abnormal:           >300   Comprehensive metabolic panel     Status: Abnormal   Collection Time: 05/23/21 10:35 AM  Result Value Ref Range   Glucose 92 70 - 99 mg/dL   BUN 18 8 - 27 mg/dL   Creatinine, Ser 0.93 0.57 - 1.00 mg/dL   eGFR 69 >59 mL/min/1.73   BUN/Creatinine Ratio 19 12 - 28   Sodium 144 134 - 144 mmol/L   Potassium 4.4 3.5 - 5.2 mmol/L   Chloride 106 96 - 106 mmol/L   CO2 23 20 - 29 mmol/L   Calcium 9.5 8.7 - 10.3 mg/dL   Total Protein 6.3 6.0 - 8.5 g/dL   Albumin 4.0 3.8 - 4.8 g/dL   Globulin, Total 2.3 1.5 - 4.5 g/dL  Albumin/Globulin Ratio 1.7 1.2 - 2.2   Bilirubin Total 0.4 0.0 - 1.2 mg/dL   Alkaline Phosphatase 128 (H) 44 - 121 IU/L   AST 19 0 - 40 IU/L   ALT 21 0 - 32 IU/L  Lipid Panel w/o Chol/HDL Ratio     Status: None   Collection Time: 05/23/21 10:35 AM  Result Value Ref Range   Cholesterol, Total 130 100 - 199 mg/dL   Triglycerides 49 0 - 149 mg/dL   HDL 73 >39 mg/dL   VLDL Cholesterol Cal 11 5 - 40 mg/dL   LDL Chol Calc (NIH) 46 0 - 99  mg/dL     PHQ2/9:    07/07/2021   11:05 AM 05/23/2021   10:29 AM 11/02/2020   10:11 AM 05/23/2020   11:10 AM 05/23/2019    1:51 PM  Depression screen PHQ 2/9  Decreased Interest 0 0 0 0 0  Down, Depressed, Hopeless 0 0 0 0 0  PHQ - 2 Score 0 0 0 0 0  Altered sleeping 0 1     Tired, decreased energy 0 0     Change in appetite 0 0     Feeling bad or failure about yourself  0 0     Trouble concentrating 0 0     Moving slowly or fidgety/restless 0 0     Suicidal thoughts 0 0     PHQ-9 Score 0 1     Difficult doing work/chores Not difficult at all          Fall Risk:    07/07/2021   11:05 AM 05/23/2021   10:29 AM 11/02/2020   10:13 AM 11/23/2018    9:46 AM 03/30/2018   10:40 AM  Fall Risk   Falls in the past year? 0 1 0 0 0  Number falls in past yr: 0 0 0    Injury with Fall? 0 0 0    Risk for fall due to : Impaired balance/gait;Impaired mobility History of fall(s) No Fall Risks    Follow up Falls evaluation completed Falls evaluation completed Falls evaluation completed  Falls evaluation completed      Functional Status Survey:      Assessment & Plan  Problem List Items Addressed This Visit   None Visit Diagnoses     Tick bite of left shoulder, initial encounter    -  Primary Acute, new problem Reports she noticed the tick yesterday and her husband attempted to remove Tick may have been in place since Thurs- she isn't sure Reports concern that tick head may still be imbedded - this was removed using forceps today in office Will provide Doxycycline 200 mg PO once for prophylaxis per U[employer] Reviewed signs of tick-borne illness to watch out for and return precautions Follow up as needed     Relevant Medications   doxycycline (VIBRA-TABS) 100 MG tablet        No follow-ups on file.   I, Aleigha Gilani E Aakash Hollomon, PA-C, have reviewed all documentation for this visit. The documentation on 07/07/21 for the exam, diagnosis, procedures, and orders are all accurate and  complete.   Talitha Givens, MHS, PA-C [facility]

## 2021-07-07 NOTE — Patient Instructions (Signed)
Keep the area clean with gentle soap and warm water Watch for signs of a rash or infection (increased swelling, redness or tenderness)  I have sent in a script for Doxycycline 200 mg to be taken once by mouth  Take with plenty of food and drink to prevent nausea or vomiting Let us know if you start to feel ill with flu like symptoms or notice a rash from the bite area.

## 2021-07-07 NOTE — Telephone Encounter (Signed)
  Chief Complaint: head stuck incomplete removal  Symptoms: red rash to right upper shoulder Frequency: couple days Pertinent Negatives: Patient denies fever joint pain Disposition: [] ED /[] Urgent Care (no appt availability in office) / [x] Appointment(In office/virtual)/ []  [facility]/ [] Home Care/ [] Refused Recommended Disposition /[] [place] [facility]/ []  Follow-up with PCP Additional Notes: pt accepted appt for today at 1120 with Erin Mecum PA Advised pt to wash the affected area dry and apply neosporin and cover with bandaid      Reason for Disposition  Can't remove live tick (after trying Care Advice)  Answer Assessment - Initial Assessment Questions 1. TYPE of TICK: "Is it a wood tick or a deer tick?" (e.g., deer tick, wood tick; unsure)     unsure 2. SIZE of TICK: "How big is the tick?" (e.g., size of poppy seed, apple seed, watermelon seed; unsure) Note: Deer ticks can be the size of a poppy seed (nymph) or an apple seed (adult).       unknown 3. ENGORGED: "Did the tick look flat or engorged (full, swollen)?" (e.g., flat, engorged; unsure)     N/a 4. LOCATION: "Where is the tick bite located?"      Upper arm near should 5. ONSET: "How long do you think the tick was attached before you removed it?" (e.g., 5 hours, 2 days)      unknown 6. APPEARANCE of BITE or RASH: "What does the site look like?"     red 7. PREGNANCY: "Is there any chance you are pregnant?" "When was your last menstrual period?"     N/a  Protocols used: Tick Bite-A-AH

## 2021-08-08 ENCOUNTER — Emergency Department: Payer: Medicare Other

## 2021-08-08 ENCOUNTER — Emergency Department
Admission: EM | Admit: 2021-08-08 | Discharge: 2021-08-08 | Disposition: A | Discharge status: Home / Self Care | Payer: Medicare Other | Attending: Emergency Medicine | Admitting: Emergency Medicine

## 2021-08-08 ENCOUNTER — Encounter: Payer: Self-pay | Admitting: Emergency Medicine

## 2021-08-08 DIAGNOSIS — G4733 Obstructive sleep apnea (adult) (pediatric): Secondary | ICD-10-CM | POA: Diagnosis not present

## 2021-08-08 DIAGNOSIS — R1031 Right lower quadrant pain: Secondary | ICD-10-CM | POA: Diagnosis present

## 2021-08-08 DIAGNOSIS — N132 Hydronephrosis with renal and ureteral calculous obstruction: Secondary | ICD-10-CM | POA: Diagnosis not present

## 2021-08-08 DIAGNOSIS — N23 Unspecified renal colic: Secondary | ICD-10-CM

## 2021-08-08 DIAGNOSIS — N281 Cyst of kidney, acquired: Secondary | ICD-10-CM | POA: Diagnosis not present

## 2021-08-08 DIAGNOSIS — S32501A Unspecified fracture of right pubis, initial encounter for closed fracture: Secondary | ICD-10-CM | POA: Diagnosis not present

## 2021-08-08 DIAGNOSIS — K8020 Calculus of gallbladder without cholecystitis without obstruction: Secondary | ICD-10-CM | POA: Diagnosis not present

## 2021-08-08 DIAGNOSIS — N200 Calculus of kidney: Secondary | ICD-10-CM

## 2021-08-08 LAB — CBC
HCT: 43.1 % (ref 36.0–46.0)
Hemoglobin: 13.8 g/dL (ref 12.0–15.0)
MCH: 28.9 pg (ref 26.0–34.0)
MCHC: 32 g/dL (ref 30.0–36.0)
MCV: 90.4 fL (ref 80.0–100.0)
Platelets: 294 10*3/uL (ref 150–400)
RBC: 4.77 MIL/uL (ref 3.87–5.11)
RDW: 13.1 % (ref 11.5–15.5)
WBC: 7.6 10*3/uL (ref 4.0–10.5)
nRBC: 0 % (ref 0.0–0.2)

## 2021-08-08 LAB — BASIC METABOLIC PANEL
Anion gap: 5 (ref 5–15)
BUN: 19 mg/dL (ref 8–23)
CO2: 26 mmol/L (ref 22–32)
Calcium: 9.2 mg/dL (ref 8.9–10.3)
Chloride: 111 mmol/L (ref 98–111)
Creatinine, Ser: 0.97 mg/dL (ref 0.44–1.00)
GFR, Estimated: >60 mL/min (ref >60)
Glucose, Bld: 116 mg/dL — ABNORMAL HIGH (ref 70–99)
Potassium: 3.9 mmol/L (ref 3.5–5.1)
Sodium: 142 mmol/L (ref 135–145)

## 2021-08-08 LAB — URINALYSIS, ROUTINE W REFLEX MICROSCOPIC
Bilirubin Urine: NEGATIVE
Glucose, UA: NEGATIVE mg/dL
Ketones, ur: NEGATIVE mg/dL
Nitrite: NEGATIVE
Protein, ur: NEGATIVE mg/dL
Specific Gravity, Urine: 1.026 (ref 1.005–1.030)
pH: 5 (ref 5.0–8.0)

## 2021-08-08 LAB — POC URINE PREG, ED: Preg Test, Ur: NEGATIVE

## 2021-08-08 MED ORDER — MORPHINE SULFATE (PF) 4 MG/ML IV SOLN
4.0000 mg | Freq: Once | INTRAVENOUS | Status: AC
  Administered 2021-08-08: 4 mg via INTRAVENOUS
  Filled 2021-08-08: qty 1

## 2021-08-08 MED ORDER — SODIUM CHLORIDE 0.9 % IV SOLN
1.0000 g | INTRAVENOUS | Status: AC
  Administered 2021-08-08: 1 g via INTRAVENOUS
  Filled 2021-08-08: qty 10

## 2021-08-08 MED ORDER — CEFADROXIL 500 MG PO CAPS: 1000.0000 mg | ORAL_CAPSULE | Freq: Two times a day (BID) | ORAL | 0 refills | Status: DC

## 2021-08-08 MED ORDER — KETOROLAC TROMETHAMINE 30 MG/ML IJ SOLN
15.0000 mg | Freq: Once | INTRAMUSCULAR | Status: AC
  Administered 2021-08-08: 15 mg via INTRAVENOUS
  Filled 2021-08-08: qty 1

## 2021-08-08 MED ORDER — ONDANSETRON 4 MG PO TBDP: ORAL_TABLET | ORAL | 0 refills | Status: DC

## 2021-08-08 MED ORDER — OXYCODONE-ACETAMINOPHEN 5-325 MG PO TABS: 2.0000 | ORAL_TABLET | Freq: Three times a day (TID) | ORAL | 0 refills | Status: DC | PRN

## 2021-08-08 MED ORDER — ONDANSETRON HCL 4 MG/2ML IJ SOLN
4.0000 mg | INTRAMUSCULAR | Status: AC
  Administered 2021-08-08: 4 mg via INTRAVENOUS
  Filled 2021-08-08: qty 2

## 2021-08-08 MED ORDER — TAMSULOSIN HCL 0.4 MG PO CAPS: ORAL_CAPSULE | ORAL | 0 refills | Status: DC

## 2021-08-08 NOTE — ED Triage Notes (Addendum)
Pt presents via POV with complaints of right sided flank pain that started 3 hours ago. She states that the pain radiates to her RLQ. Hx of kidney stones. Denies CP, SOB, or N/V.

## 2021-08-09 ENCOUNTER — Other Ambulatory Visit: Payer: Self-pay | Admitting: Nurse Practitioner

## 2021-08-09 LAB — URINE CULTURE: Culture: <10000 — AB

## 2021-08-11 ENCOUNTER — Ambulatory Visit (INDEPENDENT_AMBULATORY_CARE_PROVIDER_SITE_OTHER): Payer: Medicare Other | Admitting: Physician Assistant

## 2021-08-11 ENCOUNTER — Other Ambulatory Visit: Payer: Self-pay | Admitting: Physician Assistant

## 2021-08-11 ENCOUNTER — Telehealth: Payer: Self-pay | Admitting: *Deleted

## 2021-08-11 VITALS — BP 144/78 | HR 69 | Temp 97.9°F | Ht 63.0 in | Wt 244.0 lb

## 2021-08-11 DIAGNOSIS — N132 Hydronephrosis with renal and ureteral calculous obstruction: Secondary | ICD-10-CM | POA: Diagnosis not present

## 2021-08-11 DIAGNOSIS — N201 Calculus of ureter: Secondary | ICD-10-CM

## 2021-08-11 LAB — MICROSCOPIC EXAMINATION
Epithelial Cells (non renal): >10 /hpf — AB (ref 0–10)
RBC, Urine: >30 /hpf — AB (ref 0–2)

## 2021-08-11 LAB — URINALYSIS, COMPLETE
Bilirubin, UA: NEGATIVE
Glucose, UA: NEGATIVE
Ketones, UA: NEGATIVE
Nitrite, UA: NEGATIVE
Protein,UA: NEGATIVE
Specific Gravity, UA: 1.025 (ref 1.005–1.030)
Urobilinogen, Ur: 1 mg/dL (ref 0.2–1.0)
pH, UA: 5 (ref 5.0–7.5)

## 2021-08-11 NOTE — Progress Notes (Signed)
Surgical Physician Order Form [facility] [place]  Dr. Lonna Cobb * Scheduling expectation :  08/12/2021  *Length of Case:   *Clearance needed: no  *Anticoagulation Instructions: N/A  *Aspirin Instructions: Ok to continue Aspirin  *Post-op visit Date/Instructions:  1 month with RUS prior  *Diagnosis:  Right ureteral stones  *Procedure: right Ureteroscopy w/laser lithotripsy & stent placement (16109)   Additional orders: N/A  -Admit type: OUTpatient  -Anesthesia: General  -VTE Prophylaxis Standing Order SCD's       Other:   -Standing Lab Orders Per Anesthesia    Lab other: None  -Standing Test orders EKG/Chest x-ray per Anesthesia       Test other:   - Medications:  Ancef 2gm IV  -Other orders:  N/A

## 2021-08-11 NOTE — H&P (View-Only) (Signed)
08/11/2021 1:26 PM   Kristin Solis 1956-01-02 [id number]  CC: Chief Complaint  Patient presents with   Follow-up   HPI: Kristin Solis is a 66 y.o. female with PMH nephrolithiasis recently diagnosed with 3 right ureteral stones in the emergency department who presents today to discuss stone management.   She was seen in the emergency department 3 days ago with reports of right flank pain radiating to the right lower quadrant.  Creatinine was at baseline, 0.97.  UA with no nitrites, 21-50 RBCs/hpf, 21-50 WBCs/hpf,11-20 squamous epithelial cells/LPF, and rare bacteria.  WBC count was 7.6.  She underwent CT stone study which revealed an obstructing 10 x 6 mm right UPJ stone with right hydronephrosis as well as 2 more distal right ureteral calculi measuring 5 mm and 3 mm.  There are also 2 small right upper pole renal calculi, nonobstructing, and a punctate left upper pole stone.  She was started on Flomax, cefadroxil for possible UTI, Zofran, and Percocet.  Today she reports her pain has resolved but she has not yet seen any stones pass.  She denies fever or chills.  She states she has undergone stone procedures in the past and wishes to pursue definitive stone management at this time.  In-office UA today positive for 3+ blood and trace leukocyte esterase; urine microscopy with 11-30 WBCs/HPF, >30 RBCs/HPF, and >10 epithelial cells/hpf.   PMH: Past Medical History:  Diagnosis Date   Asthma    Hypertension    Stroke [facility])     Surgical History: Past Surgical History:  Procedure Laterality Date   IR CT HEAD LTD  04/18/2018   IR INTRAVSC STENT CERV CAROTID W/O EMB-PROT MOD SED INC ANGIO  04/18/2018   IR PERCUTANEOUS ART THROMBECTOMY/INFUSION INTRACRANIAL INC DIAG ANGIO  04/18/2018   OOPHORECTOMY     RADIOLOGY WITH ANESTHESIA N/A 04/17/2018   Procedure: RADIOLOGY WITH ANESTHESIA;  Surgeon: Julieanne Cotton, MD;  Location: [facility] OR;  Service: Radiology;  Laterality: N/A;   TOTAL  ABDOMINAL HYSTERECTOMY W/ BILATERAL SALPINGOOPHORECTOMY  2005   total    Home Medications:  Allergies as of 08/11/2021   No Known Allergies      Medication List        Accurate as of August 11, 2021  1:26 PM. If you have any questions, ask your nurse or doctor.          STOP taking these medications    cefadroxil 500 MG capsule Commonly known as: DURICEF Stopped by: Carman Ching, PA-C       TAKE these medications    albuterol 108 (90 Base) MCG/ACT inhaler Commonly known as: VENTOLIN HFA INHALE 2 PUFFS BY MOUTH EVERY 6 HOURS AS NEEDED FOR WHEEZING FOR SHORTNESS OF BREATH   aspirin EC 81 MG tablet Take 1 tablet (81 mg total) by mouth daily.   atorvastatin 40 MG tablet Commonly known as: LIPITOR Take 1 tablet (40 mg total) by mouth daily at 6 PM.   fluticasone 50 MCG/ACT nasal spray Commonly known as: FLONASE Use 2 spray(s) in each nostril once daily   fluticasone-salmeterol 250-50 MCG/ACT Aepb Commonly known as: ADVAIR Inhale 1 puff into the lungs in the morning and at bedtime.   lisinopril 5 MG tablet Commonly known as: ZESTRIL Take 1 tablet (5 mg total) by mouth daily.   ondansetron 4 MG disintegrating tablet Commonly known as: ZOFRAN-ODT Allow 1-2 tablets to dissolve in your mouth every 8 hours as needed for nausea/vomiting   oxyCODONE-acetaminophen 5-325 MG tablet  Commonly known as: Percocet Take 2 tablets by mouth every 8 (eight) hours as needed for severe pain.   tamsulosin 0.4 MG Caps capsule Commonly known as: FLOMAX Take 1 tablet by mouth daily until you pass the kidney stone or no longer have symptoms        Allergies:  No Known Allergies  Family History: Family History  Problem Relation Age of Onset   Cancer Mother        lung   Cancer Father        brain   Diabetes Sister    Alcohol abuse Brother    Stroke Son        x2   CAD Maternal Grandmother    Emphysema Paternal Grandfather    Heart disease Sister        CHF    Breast cancer Neg Hx     Social History:   reports that she has never smoked. She has never used smokeless tobacco. She reports that she does not drink alcohol and does not use drugs.  Physical Exam: BP (!) 144/78   Pulse 69   Temp 97.9 F (36.6 C) (Oral)   Ht 5\' 3"  (1.6 m)   Wt 244 lb (110.7 kg)   BMI 43.22 kg/m   Constitutional:  Alert and oriented, no acute distress, nontoxic appearing HEENT: [place], AT Cardiovascular: No clubbing, cyanosis, or edema Respiratory: Normal respiratory effort, no increased work of breathing Skin: No rashes, bruises or suspicious lesions Neurologic: Grossly intact, no focal deficits, moving all 4 extremities Psychiatric: Normal mood and affect  Laboratory Data: Results for orders placed or performed in visit on 08/11/21  Microscopic Examination   Urine  Result Value Ref Range   WBC, UA 11-30 (A) 0 - 5 /hpf   RBC >30 (A) 0 - 2 /hpf   Epithelial Cells (non renal) >10 (A) 0 - 10 /hpf   Mucus, UA Present (A) Not Estab.   Bacteria, UA Few None seen/Few  Urinalysis, Complete  Result Value Ref Range   Specific Gravity, UA 1.025 1.005 - 1.030   pH, UA 5.0 5.0 - 7.5   Color, UA Yellow Yellow   Appearance Ur Hazy (A) Clear   Leukocytes,UA Trace (A) Negative   Protein,UA Negative Negative/Trace   Glucose, UA Negative Negative   Ketones, UA Negative Negative   RBC, UA 3+ (A) Negative   Bilirubin, UA Negative Negative   Urobilinogen, Ur 1.0 0.2 - 1.0 mg/dL   Nitrite, UA Negative Negative   Microscopic Examination See below:    Pertinent Imaging: Results for orders placed during the hospital encounter of 08/08/21  CT Renal Stone Study  Narrative CLINICAL DATA:  Right-sided flank pain that began 3 hours ago  EXAM: CT ABDOMEN AND PELVIS WITHOUT CONTRAST  TECHNIQUE: Multidetector CT imaging of the abdomen and pelvis was performed following the standard protocol without IV contrast.  RADIATION DOSE REDUCTION: This exam was performed  according to the departmental dose-optimization program which includes automated exposure control, adjustment of the mA and/or kV according to patient size and/or use of iterative reconstruction technique.  COMPARISON:  10/30/2020  FINDINGS: Lower chest:  Small sliding hiatal hernia.  Hepatobiliary: Cholelithiasis. No pericholecystic inflammation or bile duct dilatation. Negative liver.  Pancreas: Unremarkable.  Spleen: Unremarkable.  Adrenals/Urinary Tract: Negative adrenals. Right hydronephrosis from a 10 x 6 mm stone at the UPJ. Just inferiorly is an ovarian vein phlebolith but in the lower right ureter are 2 adjacent calculi, the larger measuring 5  mm and the smaller measuring 3 mm. Two small right upper pole renal calculi. Punctate left upper pole renal calculus. Patchy renal cortical scarring on the right more than left. Cystic density at the upper pole left kidney. Unremarkable collapsed urinary bladder.  Stomach/Bowel:  No obstruction. No visible bowel inflammation  Vascular/Lymphatic: No acute vascular abnormality. No mass or adenopathy.  Reproductive:No acute findings.  Other: No ascites or pneumoperitoneum.  Musculoskeletal: No acute abnormalities. Remote right pubic ramus fractures. Ordinary lumbar spine degeneration.  IMPRESSION: 1. Obstructing 10 x 6 mm right UPJ calculus. 2. 2 lower right ureteral calculi measuring 5 and 3 mm. 3. Small bilateral renal calculi. 4. Cholelithiasis.   Electronically Signed By: Tiburcio Pea M.D. On: 08/08/2021 05:05  I personally reviewed the images referenced above and note 3 right ureteral stones and right hydronephrosis as well as bilateral nonobstructing nephrolithiasis.  Assessment & Plan:   1. Right ureteral stone Asymptomatic today, afebrile and VSS.  No indication for urgent intervention.  Patient wishes to pursue definitive stone management, and explained that I do not think she would be a good candidate for  ESWL because we would only be able to treat 1 stone with this procedure, most likely the most distal stone which is not likely to be causing the greatest degree of obstruction.  Instead, I recommended proceeding with right ureteroscopy with laser lithotripsy and stent placement to treat all of her right ureteral stones as well as nonobstructing right nephrolithiasis.  We discussed that URS does require placement of a ureteral stent, which will remain in place for approximately 3-10 days and can be associated with flank pain, bladder pain, dysuria, urgency, frequency, urinary leakage, and gross hematuria.  Based on this conversation, she would like to proceed with ureteroscopy tomorrow.  OR orders placed.  We will send a urine culture today, though given negative culture from the emergency department, I counseled her that she could stop the cefadroxil. - Urinalysis, Complete - CULTURE, URINE COMPREHENSIVE    Return in 1 day (on 08/12/2021) for right ureteroscopy with laser lithotripsy and stent placement with Dr. Lonna Cobb.  Carman Ching, PA-C  [facility] [address] [place], [place] [address] [phone]

## 2021-08-12 ENCOUNTER — Ambulatory Visit: Payer: Medicare Other | Admitting: Anesthesiology

## 2021-08-12 ENCOUNTER — Encounter
Admission: RE | Disposition: A | Discharge status: Home / Self Care | Payer: Self-pay | Source: Home / Self Care | Attending: Urology

## 2021-08-12 ENCOUNTER — Ambulatory Visit
Admission: RE | Admit: 2021-08-12 | Discharge: 2021-08-12 | Disposition: A | Discharge status: Home / Self Care | Payer: Medicare Other | Attending: Urology | Admitting: Urology

## 2021-08-12 ENCOUNTER — Encounter: Payer: Self-pay | Admitting: Urology

## 2021-08-12 ENCOUNTER — Other Ambulatory Visit: Payer: Self-pay

## 2021-08-12 ENCOUNTER — Ambulatory Visit: Payer: Medicare Other

## 2021-08-12 DIAGNOSIS — N132 Hydronephrosis with renal and ureteral calculous obstruction: Secondary | ICD-10-CM | POA: Insufficient documentation

## 2021-08-12 DIAGNOSIS — I10 Essential (primary) hypertension: Secondary | ICD-10-CM

## 2021-08-12 DIAGNOSIS — N202 Calculus of kidney with calculus of ureter: Secondary | ICD-10-CM | POA: Diagnosis not present

## 2021-08-12 DIAGNOSIS — N201 Calculus of ureter: Secondary | ICD-10-CM | POA: Diagnosis not present

## 2021-08-12 DIAGNOSIS — E782 Mixed hyperlipidemia: Secondary | ICD-10-CM | POA: Diagnosis not present

## 2021-08-12 DIAGNOSIS — N200 Calculus of kidney: Secondary | ICD-10-CM

## 2021-08-12 HISTORY — PX: CYSTOSCOPY/URETEROSCOPY/HOLMIUM LASER/STENT PLACEMENT: SHX6546

## 2021-08-12 SURGERY — CYSTOSCOPY/URETEROSCOPY/HOLMIUM LASER/STENT PLACEMENT
Anesthesia: General | Site: Ureter | Laterality: Right

## 2021-08-12 MED ORDER — CEFAZOLIN SODIUM-DEXTROSE 2-4 GM/100ML-% IV SOLN
2.0000 g | INTRAVENOUS | Status: AC
  Administered 2021-08-12: 2 g via INTRAVENOUS

## 2021-08-12 MED ORDER — PHENYLEPHRINE 80 MCG/ML (10ML) SYRINGE FOR IV PUSH (FOR BLOOD PRESSURE SUPPORT)
PREFILLED_SYRINGE | INTRAVENOUS | Status: DC | PRN
  Administered 2021-08-12 (×3): 80 ug via INTRAVENOUS

## 2021-08-12 MED ORDER — ONDANSETRON HCL 4 MG/2ML IJ SOLN
INTRAMUSCULAR | Status: DC | PRN
  Administered 2021-08-12: 4 mg via INTRAVENOUS

## 2021-08-12 MED ORDER — LACTATED RINGERS IV SOLN: INTRAVENOUS | Status: DC

## 2021-08-12 MED ORDER — ROCURONIUM BROMIDE 100 MG/10ML IV SOLN
INTRAVENOUS | Status: DC | PRN
  Administered 2021-08-12 (×2): 10 mg via INTRAVENOUS
  Administered 2021-08-12: 50 mg via INTRAVENOUS

## 2021-08-12 MED ORDER — ROCURONIUM BROMIDE 10 MG/ML (PF) SYRINGE
PREFILLED_SYRINGE | INTRAVENOUS | Status: AC
Start: 2021-08-12 — End: ?
  Filled 2021-08-12: qty 10

## 2021-08-12 MED ORDER — ORAL CARE MOUTH RINSE
15.0000 mL | Freq: Once | OROMUCOSAL | Status: AC
Start: 2021-08-12 — End: 2021-08-12

## 2021-08-12 MED ORDER — ACETAMINOPHEN 10 MG/ML IV SOLN
INTRAVENOUS | Status: DC | PRN
  Administered 2021-08-12: 1000 mg via INTRAVENOUS

## 2021-08-12 MED ORDER — PROPOFOL 10 MG/ML IV BOLUS
INTRAVENOUS | Status: AC
  Filled 2021-08-12: qty 20

## 2021-08-12 MED ORDER — IOHEXOL 180 MG/ML  SOLN
INTRAMUSCULAR | Status: DC | PRN
  Administered 2021-08-12: 10 mL

## 2021-08-12 MED ORDER — SUGAMMADEX SODIUM 200 MG/2ML IV SOLN
INTRAVENOUS | Status: DC | PRN
  Administered 2021-08-12: 400 mg via INTRAVENOUS

## 2021-08-12 MED ORDER — LIDOCAINE HCL (CARDIAC) PF 100 MG/5ML IV SOSY
PREFILLED_SYRINGE | INTRAVENOUS | Status: DC | PRN
  Administered 2021-08-12: 100 mg via INTRAVENOUS

## 2021-08-12 MED ORDER — CHLORHEXIDINE GLUCONATE 0.12 % MT SOLN
15.0000 mL | Freq: Once | OROMUCOSAL | Status: AC
Start: 2021-08-12 — End: 2021-08-12

## 2021-08-12 MED ORDER — SCOPOLAMINE 1 MG/3DAYS TD PT72
MEDICATED_PATCH | TRANSDERMAL | Status: AC
  Filled 2021-08-12: qty 1

## 2021-08-12 MED ORDER — CEFAZOLIN SODIUM-DEXTROSE 2-4 GM/100ML-% IV SOLN
INTRAVENOUS | Status: AC
  Filled 2021-08-12: qty 100

## 2021-08-12 MED ORDER — SCOPOLAMINE 1 MG/3DAYS TD PT72
MEDICATED_PATCH | TRANSDERMAL | Status: DC | PRN
  Administered 2021-08-12: 1 via TRANSDERMAL

## 2021-08-12 MED ORDER — MIDAZOLAM HCL 2 MG/2ML IJ SOLN
INTRAMUSCULAR | Status: DC | PRN
  Administered 2021-08-12 (×2): 1 mg via INTRAVENOUS

## 2021-08-12 MED ORDER — KETOROLAC TROMETHAMINE 30 MG/ML IJ SOLN
INTRAMUSCULAR | Status: DC | PRN
  Administered 2021-08-12: 15 mg via INTRAVENOUS

## 2021-08-12 MED ORDER — LIDOCAINE HCL (PF) 2 % IJ SOLN
INTRAMUSCULAR | Status: AC
  Filled 2021-08-12: qty 5

## 2021-08-12 MED ORDER — ACETAMINOPHEN 10 MG/ML IV SOLN
INTRAVENOUS | Status: AC
  Filled 2021-08-12: qty 100

## 2021-08-12 MED ORDER — DEXAMETHASONE SODIUM PHOSPHATE 10 MG/ML IJ SOLN
INTRAMUSCULAR | Status: AC
  Filled 2021-08-12: qty 1

## 2021-08-12 MED ORDER — OXYBUTYNIN CHLORIDE 5 MG PO TABS: ORAL_TABLET | ORAL | 0 refills | Status: DC

## 2021-08-12 MED ORDER — ONDANSETRON HCL 4 MG/2ML IJ SOLN
INTRAMUSCULAR | Status: AC
  Filled 2021-08-12: qty 2

## 2021-08-12 MED ORDER — EPHEDRINE SULFATE (PRESSORS) 50 MG/ML IJ SOLN
INTRAMUSCULAR | Status: DC | PRN
  Administered 2021-08-12: 10 mg via INTRAVENOUS
  Administered 2021-08-12: 5 mg via INTRAVENOUS

## 2021-08-12 MED ORDER — CHLORHEXIDINE GLUCONATE 0.12 % MT SOLN
OROMUCOSAL | Status: AC
  Administered 2021-08-12: 15 mL via OROMUCOSAL
  Filled 2021-08-12: qty 15

## 2021-08-12 MED ORDER — FENTANYL CITRATE (PF) 100 MCG/2ML IJ SOLN: 25.0000 ug | INTRAMUSCULAR | Status: DC | PRN

## 2021-08-12 MED ORDER — FENTANYL CITRATE (PF) 100 MCG/2ML IJ SOLN
INTRAMUSCULAR | Status: AC
  Filled 2021-08-12: qty 2

## 2021-08-12 MED ORDER — PROPOFOL 10 MG/ML IV BOLUS
INTRAVENOUS | Status: DC | PRN
  Administered 2021-08-12: 150 mg via INTRAVENOUS
  Administered 2021-08-12: 50 mg via INTRAVENOUS

## 2021-08-12 MED ORDER — SODIUM CHLORIDE 0.9 % IR SOLN
Status: DC | PRN
  Administered 2021-08-12: 3000 mL via INTRAVESICAL

## 2021-08-12 MED ORDER — KETOROLAC TROMETHAMINE 30 MG/ML IJ SOLN
INTRAMUSCULAR | Status: AC
Start: 2021-08-12 — End: ?
  Filled 2021-08-12: qty 1

## 2021-08-12 MED ORDER — FENTANYL CITRATE (PF) 100 MCG/2ML IJ SOLN
INTRAMUSCULAR | Status: DC | PRN
  Administered 2021-08-12: 50 ug via INTRAVENOUS

## 2021-08-12 MED ORDER — DEXAMETHASONE SODIUM PHOSPHATE 10 MG/ML IJ SOLN
INTRAMUSCULAR | Status: DC | PRN
  Administered 2021-08-12: 5 mg via INTRAVENOUS

## 2021-08-12 MED ORDER — MIDAZOLAM HCL 2 MG/2ML IJ SOLN
INTRAMUSCULAR | Status: AC
  Filled 2021-08-12: qty 2

## 2021-08-12 MED ORDER — ONDANSETRON HCL 4 MG/2ML IJ SOLN: 4.0000 mg | Freq: Once | INTRAMUSCULAR | Status: DC | PRN

## 2021-08-12 SURGICAL SUPPLY — 19 items
BAG DRAIN CYSTO-URO LG1000N (MISCELLANEOUS) ×2 IMPLANT
BASKET ZERO TIP 1.9FR (BASKET) ×1 IMPLANT
BRUSH SCRUB EZ 1% IODOPHOR (MISCELLANEOUS) ×2 IMPLANT
DRAPE UTILITY 15X26 TOWEL STRL (DRAPES) ×2 IMPLANT
FIBER LASER MOSES 200 DFL (Laser) ×1 IMPLANT
GLOVE SURG UNDER POLY LF SZ7.5 (GLOVE) ×2 IMPLANT
GOWN STRL REUS W/ TWL LRG LVL3 (GOWN DISPOSABLE) ×1 IMPLANT
GOWN STRL REUS W/ TWL XL LVL3 (GOWN DISPOSABLE) ×1 IMPLANT
GOWN STRL REUS W/TWL LRG LVL3 (GOWN DISPOSABLE) ×1
GOWN STRL REUS W/TWL XL LVL3 (GOWN DISPOSABLE) ×2
GUIDEWIRE STR DUAL SENSOR (WIRE) ×2 IMPLANT
IV NS IRRIG 3000ML ARTHROMATIC (IV SOLUTION) ×2 IMPLANT
KIT TURNOVER CYSTO (KITS) ×2 IMPLANT
PACK CYSTO AR (MISCELLANEOUS) ×2 IMPLANT
SET CYSTO W/LG BORE CLAMP LF (SET/KITS/TRAYS/PACK) ×2 IMPLANT
SURGILUBE 2OZ TUBE FLIPTOP (MISCELLANEOUS) ×2 IMPLANT
TRACTIP FLEXIVA PULSE ID 200 (Laser) ×2 IMPLANT
VALVE UROSEAL ADJ ENDO (VALVE) ×1 IMPLANT
WATER STERILE IRR 500ML POUR (IV SOLUTION) ×2 IMPLANT

## 2021-08-12 NOTE — Transfer of Care (Signed)
Immediate Anesthesia Transfer of Care Note  Patient: Kristin Solis  Procedure(s) Performed: CYSTOSCOPY/URETEROSCOPY/HOLMIUM LASER/STENT PLACEMENT/LITHOTRIPSY (Right: Ureter)  Patient Location: PACU  Anesthesia Type:General  Level of Consciousness: awake  Airway & Oxygen Therapy: Patient connected to face mask oxygen  Post-op Assessment: Report given to RN  Post vital signs: stable  Last Vitals:  Vitals Value Taken Time  BP 110/45 08/12/21 0941  Temp    Pulse 65 08/12/21 0943  Resp 18 08/12/21 0943  SpO2 100 % 08/12/21 0943  Vitals shown include unvalidated device data.  Last Pain:  Vitals:   08/12/21 0625  TempSrc: Oral  PainSc: 0-No pain         Complications: No notable events documented.

## 2021-08-12 NOTE — Anesthesia Preprocedure Evaluation (Signed)
Anesthesia Evaluation  Patient identified by MRN, date of birth, ID band Patient awake    Reviewed: Allergy & Precautions, H&P , NPO status , Patient's Chart, lab work & pertinent test results, reviewed documented beta blocker date and time   Airway Mallampati: II  TM Distance: >3 FB Neck ROM: full    Dental  (+) Teeth Intact   Pulmonary neg pulmonary ROS, asthma , sleep apnea and Continuous Positive Airway Pressure Ventilation ,    Pulmonary exam normal        Cardiovascular Exercise Tolerance: Good hypertension, On Medications negative cardio ROS Normal cardiovascular exam+ Valvular Problems/Murmurs  Rate:Normal     Neuro/Psych CVA, No Residual Symptoms negative neurological ROS  negative psych ROS   GI/Hepatic negative GI ROS, Neg liver ROS,   Endo/Other  negative endocrine ROS  Renal/GU negative Renal ROS  negative genitourinary   Musculoskeletal   Abdominal   Peds  Hematology negative hematology ROS (+)   Anesthesia Other Findings   Reproductive/Obstetrics negative OB ROS                             Anesthesia Physical Anesthesia Plan  ASA: 3  Anesthesia Plan: General LMA   Post-op Pain Management:    Induction:   PONV Risk Score and Plan:   Airway Management Planned:   Additional Equipment:   Intra-op Plan:   Post-operative Plan:   Informed Consent: I have reviewed the patients History and Physical, chart, labs and discussed the procedure including the risks, benefits and alternatives for the proposed anesthesia with the patient or authorized representative who has indicated his/her understanding and acceptance.       Plan Discussed with: CRNA  Anesthesia Plan Comments:         Anesthesia Quick Evaluation

## 2021-08-13 ENCOUNTER — Encounter: Payer: Self-pay | Admitting: Urology

## 2021-08-14 LAB — CULTURE, URINE COMPREHENSIVE

## 2021-08-18 ENCOUNTER — Ambulatory Visit (INDEPENDENT_AMBULATORY_CARE_PROVIDER_SITE_OTHER): Payer: Medicare Other | Admitting: Physician Assistant

## 2021-08-18 DIAGNOSIS — N201 Calculus of ureter: Secondary | ICD-10-CM

## 2021-08-18 NOTE — Progress Notes (Signed)
Patient present today for string stent removal. Patient was unable to remove at home stating she could not visualize string. String was visible upon exam and stent was removed with out complications. Patient tolerated well, will keep follow up as scheduled

## 2021-08-19 LAB — CALCULI, WITH PHOTOGRAPH (CLINICAL LAB)
Calcium Oxalate Dihydrate: 30 %
Calcium Oxalate Monohydrate: 65 %
Hydroxyapatite: 5 %
Weight Calculi: 10 mg

## 2021-08-20 NOTE — Interval H&P Note (Signed)
History and Physical Interval Note:  CV:RRR Lungs:clear  08/20/2021 10:41 AM  Kristin Solis  has presented today for surgery, with the diagnosis of Right ureteral stone.  The various methods of treatment have been discussed with the patient and family. After consideration of risks, benefits and other options for treatment, the patient has consented to  Procedure(s): CYSTOSCOPY/URETEROSCOPY/HOLMIUM LASER/STENT PLACEMENT/LITHOTRIPSY (Right) as a surgical intervention.  The patient's history has been reviewed, patient examined, no change in status, stable for surgery.  I have reviewed the patient's chart and labs.  Questions were answered to the patient's satisfaction.     [name] C Laini Urick

## 2021-08-30 ENCOUNTER — Other Ambulatory Visit: Payer: Self-pay | Admitting: Nurse Practitioner

## 2021-09-01 NOTE — Telephone Encounter (Signed)
Requested Prescriptions  Pending Prescriptions Disp Refills  . albuterol (VENTOLIN HFA) 108 (90 Base) MCG/ACT inhaler [Pharmacy Med Name: Albuterol Sulfate HFA 108 (90 Base) MCG/ACT Inhalation Aerosol Solution] 9 g 0    Sig: INHALE 2 PUFFS BY MOUTH EVERY 6 HOURS AS NEEDED FOR WHEEZING FOR SHORTNESS OF BREATH     Pulmonology:  Beta Agonists 2 Passed - 08/30/2021  9:21 AM      Passed - Last BP in normal range    BP Readings from Last 1 Encounters:  08/12/21 (!) 126/50         Passed - Last Heart Rate in normal range    Pulse Readings from Last 1 Encounters:  08/12/21 62         Passed - Valid encounter within last 12 months    Recent Outpatient Visits          1 month ago Tick bite of left shoulder, initial encounter   [facility] Mecum, Erin E, PA-C   3 months ago Aortic atherosclerosis (HCC)   [facility] [name], [name], NP   9 months ago Morbid obesity (HCC)   [facility] [place], [place] T, NP   10 months ago Acute right flank pain   [facility] [place], [place] T, NP   10 months ago Acute right flank pain   [facility] [place], [name], NP      Future Appointments            In 2 months [name], [name], NP [employer]

## 2021-09-05 ENCOUNTER — Other Ambulatory Visit: Payer: Self-pay | Admitting: Nurse Practitioner

## 2021-09-05 NOTE — Telephone Encounter (Signed)
Requested Prescriptions  Pending Prescriptions Disp Refills  . fluticasone (FLONASE) 50 MCG/ACT nasal spray [Pharmacy Med Name: Fluticasone Propionate 50 MCG/ACT Nasal Suspension] 16 g 2    Sig: Use 2 spray(s) in each nostril once daily     Ear, Nose, and Throat: Nasal Preparations - Corticosteroids Passed - 09/05/2021  2:16 PM      Passed - Valid encounter within last 12 months    Recent Outpatient Visits          2 months ago Tick bite of left shoulder, initial encounter   [facility] Mecum, Erin E, PA-C   3 months ago Aortic atherosclerosis (HCC)   [facility] [name], [name], NP   9 months ago Morbid obesity (HCC)   [facility] [place], [place] T, NP   10 months ago Acute right flank pain   [facility] [place], [place] T, NP   10 months ago Acute right flank pain   [facility] [employer], [name], NP      Future Appointments            In 2 months [name], [name], NP [employer]

## 2021-09-12 ENCOUNTER — Encounter: Payer: Self-pay | Admitting: Urology

## 2021-09-12 ENCOUNTER — Ambulatory Visit (INDEPENDENT_AMBULATORY_CARE_PROVIDER_SITE_OTHER): Payer: Medicare Other | Admitting: Urology

## 2021-09-12 VITALS — BP 173/80 | HR 101 | Ht 63.0 in | Wt 234.0 lb

## 2021-09-12 DIAGNOSIS — Z87442 Personal history of urinary calculi: Secondary | ICD-10-CM | POA: Diagnosis not present

## 2021-09-12 DIAGNOSIS — N200 Calculus of kidney: Secondary | ICD-10-CM

## 2021-09-12 NOTE — Patient Instructions (Signed)

## 2021-09-12 NOTE — Progress Notes (Signed)
09/12/2021 11:41 AM   Kristin Solis 02/24/1955 [id number]  Referring provider: [name], NP [address] [place],  [place] [address]  Chief Complaint  Patient presents with   Other    HPI: 66 y.o. female presents for postop follow-up.  Ureteroscopic removal of right distal ureteral calculi x2 and 10 mm renal calculus 08/12/2021 Stent left on a tether which she removed 08/16/2021 No postoperative complaints Stone analysis: CaOxMono/CaOxDi/hydroxyapatite 65/30/5   PMH: Past Medical History:  Diagnosis Date   Asthma    Hypertension    Stroke [facility])     Surgical History: Past Surgical History:  Procedure Laterality Date   CYSTOSCOPY/URETEROSCOPY/HOLMIUM LASER/STENT PLACEMENT Right 08/12/2021   Procedure: CYSTOSCOPY/URETEROSCOPY/HOLMIUM LASER/STENT PLACEMENT/LITHOTRIPSY;  Surgeon: Riki Altes, MD;  Location: [facility] ORS;  Service: Urology;  Laterality: Right;   IR CT HEAD LTD  04/18/2018   IR INTRAVSC STENT CERV CAROTID W/O EMB-PROT MOD SED INC ANGIO  04/18/2018   IR PERCUTANEOUS ART THROMBECTOMY/INFUSION INTRACRANIAL INC DIAG ANGIO  04/18/2018   OOPHORECTOMY     RADIOLOGY WITH ANESTHESIA N/A 04/17/2018   Procedure: RADIOLOGY WITH ANESTHESIA;  Surgeon: Julieanne Cotton, MD;  Location: [facility] OR;  Service: Radiology;  Laterality: N/A;   TOTAL ABDOMINAL HYSTERECTOMY W/ BILATERAL SALPINGOOPHORECTOMY  2005   total    Home Medications:  Allergies as of 09/12/2021   No Known Allergies      Medication List        Accurate as of September 12, 2021 11:41 AM. If you have any questions, ask your nurse or doctor.          albuterol 108 (90 Base) MCG/ACT inhaler Commonly known as: VENTOLIN HFA INHALE 2 PUFFS BY MOUTH EVERY 6 HOURS AS NEEDED FOR WHEEZING FOR SHORTNESS OF BREATH   aspirin EC 81 MG tablet Take 1 tablet (81 mg total) by mouth daily.   atorvastatin 40 MG tablet Commonly known as: LIPITOR Take 1 tablet (40 mg total) by mouth daily at 6 PM.   fluticasone  50 MCG/ACT nasal spray Commonly known as: FLONASE Use 2 spray(s) in each nostril once daily   fluticasone-salmeterol 250-50 MCG/ACT Aepb Commonly known as: ADVAIR Inhale 1 puff into the lungs in the morning and at bedtime.   lisinopril 5 MG tablet Commonly known as: ZESTRIL Take 1 tablet (5 mg total) by mouth daily.   ondansetron 4 MG disintegrating tablet Commonly known as: ZOFRAN-ODT Allow 1-2 tablets to dissolve in your mouth every 8 hours as needed for nausea/vomiting   oxybutynin 5 MG tablet Commonly known as: DITROPAN 1 tab tid prn frequency,urgency, bladder spasm   oxyCODONE-acetaminophen 5-325 MG tablet Commonly known as: Percocet Take 2 tablets by mouth every 8 (eight) hours as needed for severe pain.   tamsulosin 0.4 MG Caps capsule Commonly known as: FLOMAX Take 1 tablet by mouth daily until you pass the kidney stone or no longer have symptoms        Allergies: No Known Allergies  Family History: Family History  Problem Relation Age of Onset   Cancer Mother        lung   Cancer Father        brain   Diabetes Sister    Alcohol abuse Brother    Stroke Son        x2   CAD Maternal Grandmother    Emphysema Paternal Grandfather    Heart disease Sister        CHF   Breast cancer Neg Hx  Social History:  reports that she has never smoked. She has never used smokeless tobacco. She reports that she does not drink alcohol and does not use drugs.   Physical Exam: BP (!) 173/80   Pulse (!) 101   Ht 5\' 3"  (1.6 m)   Wt 234 lb (106.1 kg)   BMI 41.45 kg/m   Constitutional:  Alert, No acute distress. HEENT: [place] AT Respiratory: Normal respiratory effort, no increased work of breathing. Psychiatric: Normal mood and affect.   Assessment & Plan:   Doing well status post ureteroscopic stone removal Recurrent stone former and recommended a metabolic evaluation to include 24-hour urine study and blood work.  Ordered through and will notify with  results 24-month follow-up with KUB   8-month, MD  [facility] [address] [place], [place] [phone]

## 2021-09-18 ENCOUNTER — Other Ambulatory Visit: Payer: Self-pay | Admitting: Nurse Practitioner

## 2021-09-18 DIAGNOSIS — Z1231 Encounter for screening mammogram for malignant neoplasm of breast: Secondary | ICD-10-CM

## 2021-09-21 ENCOUNTER — Other Ambulatory Visit: Payer: Self-pay | Admitting: Nurse Practitioner

## 2021-09-22 NOTE — Telephone Encounter (Signed)
Requested medication (s) are due for refill today: yes 10/05/21  Requested medication (s) are on the active medication list: yes  Last refill:  09/01/21 #9g 0 refills  Future visit scheduled: yes in 2 months  Notes to clinic:  do you want to give more refills?     Requested Prescriptions  Pending Prescriptions Disp Refills   albuterol (VENTOLIN HFA) 108 (90 Base) MCG/ACT inhaler [Pharmacy Med Name: Albuterol Sulfate HFA 108 (90 Base) MCG/ACT Inhalation Aerosol Solution] 9 g 0    Sig: INHALE 2 PUFFS BY MOUTH EVERY 6 HOURS AS NEEDED FOR WHEEZING FOR SHORTNESS OF BREATH     Pulmonology:  Beta Agonists 2 Failed - 09/21/2021 12:21 PM      Failed - Last BP in normal range    BP Readings from Last 1 Encounters:  09/12/21 (!) 173/80         Passed - Last Heart Rate in normal range    Pulse Readings from Last 1 Encounters:  09/12/21 (!) 101         Passed - Valid encounter within last 12 months    Recent Outpatient Visits           2 months ago Tick bite of left shoulder, initial encounter   [employer], Erin E, PA-C   4 months ago Aortic atherosclerosis (HCC)   [facility] [name], [name], NP   10 months ago Morbid obesity (HCC)   [facility] [place], E. [place] T, NP   11 months ago Acute right flank pain   [facility] [place], [place] T, NP   11 months ago Acute right flank pain   [facility] [place], [name], NP       Future Appointments             In 2 months [name], [name], NP [employer]   In 5 months Stoioff, Verna Czech, MD [employer]

## 2021-09-24 ENCOUNTER — Encounter: Payer: Self-pay | Admitting: Urology

## 2021-10-13 ENCOUNTER — Ambulatory Visit
Admission: RE | Admit: 2021-10-13 | Discharge: 2021-10-13 | Disposition: A | Discharge status: Home / Self Care | Payer: Medicare Other | Source: Ambulatory Visit | Attending: Nurse Practitioner | Admitting: Nurse Practitioner

## 2021-10-13 DIAGNOSIS — Z1231 Encounter for screening mammogram for malignant neoplasm of breast: Secondary | ICD-10-CM | POA: Diagnosis not present

## 2021-10-15 NOTE — Progress Notes (Signed)
Contacted via MyChart   Normal mammogram, may repeat in one year:)

## 2021-10-23 ENCOUNTER — Other Ambulatory Visit: Payer: Self-pay | Admitting: *Deleted

## 2021-10-23 DIAGNOSIS — N201 Calculus of ureter: Secondary | ICD-10-CM

## 2021-11-03 ENCOUNTER — Ambulatory Visit (INDEPENDENT_AMBULATORY_CARE_PROVIDER_SITE_OTHER): Payer: Medicare Other

## 2021-11-03 VITALS — BP 114/88 | HR 91 | Ht 63.0 in | Wt 237.0 lb

## 2021-11-03 DIAGNOSIS — Z0000 Encounter for general adult medical examination without abnormal findings: Secondary | ICD-10-CM | POA: Diagnosis not present

## 2021-11-03 NOTE — Progress Notes (Signed)
I connected with  [name] on 11/03/21 by a audio enabled telemedicine application and verified that I am speaking with the correct person using two identifiers.  Patient Location: Home  Provider Location: Office/Clinic  I discussed the limitations of evaluation and management by telemedicine. The patient expressed understanding and agreed to proceed.    Subjective:   Kristin Solis is a 66 y.o. female who presents for Medicare Annual (Subsequent) preventive examination.  Review of Systems    Defer to PCP.     Objective:    Today's Vitals   11/03/21 1053  BP: 114/88  Pulse: 91  Weight: 237 lb (107.5 kg)  Height: 5\' 3"  (1.6 m)   Body mass index is 41.98 kg/m.     08/12/2021    6:23 AM 08/08/2021    4:19 AM 11/02/2020   10:13 AM 08/14/2019   10:10 AM 08/14/2019    8:14 AM 12/13/2018    2:35 PM 04/17/2018    7:15 PM  Advanced Directives  Does Patient Have a Medical Advance Directive? No No No No No No No  Would patient like information on creating a medical advance directive? No - Patient declined  No - Patient declined No - Patient declined  Yes (MAU/Ambulatory/Procedural Areas - Information given) No - Patient declined    Current Medications (verified) Outpatient Encounter Medications as of 11/03/2021  Medication Sig   albuterol (VENTOLIN HFA) 108 (90 Base) MCG/ACT inhaler INHALE 2 PUFFS BY MOUTH EVERY 6 HOURS AS NEEDED FOR WHEEZING FOR SHORTNESS OF BREATH   aspirin EC 81 MG EC tablet Take 1 tablet (81 mg total) by mouth daily.   atorvastatin (LIPITOR) 40 MG tablet Take 1 tablet (40 mg total) by mouth daily at 6 PM.   fluticasone (FLONASE) 50 MCG/ACT nasal spray Use 2 spray(s) in each nostril once daily   fluticasone-salmeterol (ADVAIR) 250-50 MCG/ACT AEPB Inhale 1 puff into the lungs in the morning and at bedtime.   lisinopril (ZESTRIL) 5 MG tablet Take 1 tablet (5 mg total) by mouth daily.   ondansetron (ZOFRAN-ODT) 4 MG disintegrating tablet Allow 1-2 tablets  to dissolve in your mouth every 8 hours as needed for nausea/vomiting (Patient not taking: Reported on 11/03/2021)   [DISCONTINUED] oxybutynin (DITROPAN) 5 MG tablet 1 tab tid prn frequency,urgency, bladder spasm (Patient not taking: Reported on 11/03/2021)   [DISCONTINUED] oxyCODONE-acetaminophen (PERCOCET) 5-325 MG tablet Take 2 tablets by mouth every 8 (eight) hours as needed for severe pain.   [DISCONTINUED] tamsulosin (FLOMAX) 0.4 MG CAPS capsule Take 1 tablet by mouth daily until you pass the kidney stone or no longer have symptoms   No facility-administered encounter medications on file as of 11/03/2021.    Allergies (verified) Patient has no known allergies.   History: Past Medical History:  Diagnosis Date   Asthma    Hypertension    Stroke [facility])    Past Surgical History:  Procedure Laterality Date   CYSTOSCOPY/URETEROSCOPY/HOLMIUM LASER/STENT PLACEMENT Right 08/12/2021   Procedure: CYSTOSCOPY/URETEROSCOPY/HOLMIUM LASER/STENT PLACEMENT/LITHOTRIPSY;  Surgeon: Abbie Sons, MD;  Location: [facility] ORS;  Service: Urology;  Laterality: Right;   IR CT HEAD LTD  04/18/2018   IR INTRAVSC STENT CERV CAROTID W/O EMB-PROT MOD SED INC ANGIO  04/18/2018   IR PERCUTANEOUS ART THROMBECTOMY/INFUSION INTRACRANIAL INC DIAG ANGIO  04/18/2018   OOPHORECTOMY     RADIOLOGY WITH ANESTHESIA N/A 04/17/2018   Procedure: RADIOLOGY WITH ANESTHESIA;  Surgeon: Luanne Bras, MD;  Location: [place];  Service: Radiology;  Laterality:  N/A;   TOTAL ABDOMINAL HYSTERECTOMY W/ BILATERAL SALPINGOOPHORECTOMY  2005   total   Family History  Problem Relation Age of Onset   Cancer Mother        lung   Cancer Father        brain   Diabetes Sister    Alcohol abuse Brother    Stroke Son        x2   CAD Maternal Grandmother    Emphysema Paternal Grandfather    Heart disease Sister        CHF   Breast cancer Neg Hx    Social History   Socioeconomic History   Marital status: Married    Spouse name: Not on  file   Number of children: Not on file   Years of education: Not on file   Highest education level: Not on file  Occupational History   Not on file  Tobacco Use   Smoking status: Never   Smokeless tobacco: Never  Vaping Use   Vaping Use: Never used  Substance and Sexual Activity   Alcohol use: No   Drug use: No   Sexual activity: Never  Other Topics Concern   Not on file  Social History Narrative   Not on file   Social Determinants of Health   Financial Resource Strain: Low Risk  (11/03/2021)   Overall Financial Resource Strain (CARDIA)    Difficulty of Paying Living Expenses: Not hard at all  Food Insecurity: No Food Insecurity (11/03/2021)   Hunger Vital Sign    Worried About Running Out of Food in the Last Year: Never true    Ran Out of Food in the Last Year: Never true  Transportation Needs: No Transportation Needs (11/03/2021)   PRAPARE - Hydrologist (Medical): No    Lack of Transportation (Non-Medical): No  Physical Activity: Inactive (11/03/2021)   Exercise Vital Sign    Days of Exercise per Week: 0 days    Minutes of Exercise per Session: 0 min  Stress: No Stress Concern Present (11/03/2021)   [place]    Feeling of Stress : Not at all  Social Connections: Socially Isolated (11/03/2021)   Social Connection and Isolation Panel [NHANES]    Frequency of Communication with Friends and Family: Once a week    Frequency of Social Gatherings with Friends and Family: Once a week    Attends Religious Services: Never    Marine scientist or Organizations: No    Attends Music therapist: Never    Marital Status: Married    Tobacco Counseling Counseling given: Not Answered   Clinical Intake:  Pre-visit preparation completed: Yes  Pain : No/denies pain     BMI - recorded: 41.98 Nutritional Status: BMI > 30  Obese Nutritional Risks: None Diabetes:  No  How often do you need to have someone help you when you read instructions, pamphlets, or other written materials from your doctor or pharmacy?: 1 - Never What is the last grade level you completed in school?: 11th grade  Diabetic- No  Interpreter Needed?: No     Activities of Daily Living    11/03/2021   11:07 AM  In your present state of health, do you have any difficulty performing the following activities:  Hearing? 0  Vision? 0  Difficulty concentrating or making decisions? 0  Walking or climbing stairs? 0  Dressing or bathing? 0  Doing errands,  shopping? 1    Patient Care Team: [name], NP as PCP - General (Nurse Practitioner) Greg Cutter, LCSW as Social Worker (Licensed Clinical Social Worker)  Indicate any recent [employer] you may have received from other than [facility] providers in the past year (date may be approximate).     Assessment:   This is a routine wellness examination for [place].  Hearing/Vision screen No results found.  Dietary issues and exercise activities discussed:     Goals Addressed   None   Depression Screen    11/03/2021   11:04 AM 07/07/2021   11:05 AM 05/23/2021   10:29 AM 11/02/2020   10:11 AM 05/23/2020   11:10 AM 05/23/2019    1:51 PM 09/28/2018   10:28 AM  PHQ 2/9 Scores  PHQ - 2 Score 0 0 0 0 0 0 0  PHQ- 9 Score 3 0 1    0    Fall Risk    11/03/2021   11:06 AM 07/07/2021   11:05 AM 05/23/2021   10:29 AM 11/02/2020   10:13 AM 11/23/2018    9:46 AM  Fall Risk   Falls in the past year? 1 0 1 0 0  Number falls in past yr: 0 0 0 0   Injury with Fall? 0 0 0 0   Risk for fall due to : No Fall Risks Impaired balance/gait;Impaired mobility History of fall(s) No Fall Risks   Follow up Falls evaluation completed Falls evaluation completed Falls evaluation completed Falls evaluation completed     [place]:  Any stairs in or around the home? Yes  If so, are there any without  handrails? Yes  Home free of loose throw rugs in walkways, pet beds, electrical cords, etc? Yes  Adequate lighting in your home to reduce risk of falls? Yes   ASSISTIVE DEVICES UTILIZED TO PREVENT FALLS:  Life alert? No  Use of a cane, walker or w/c? Yes  Grab bars in the bathroom? No  Shower chair or bench in shower? No Elevated toilet seat or a handicapped toilet? No   TIMED UP AND GO:  Was the test performed? Yes .    Cognitive Function:        11/03/2021   11:07 AM 11/02/2020   10:14 AM  6CIT Screen  What Year? 0 points 0 points  What month? 0 points 0 points  What time? 0 points 0 points  Count back from 20 0 points 0 points  Months in reverse 0 points 0 points  Repeat phrase 2 points 2 points  Total Score 2 points 2 points    Immunizations Immunization History  Administered Date(s) Administered   Influenza,inj,Quad PF,6+ Mos 12/07/2018, 01/22/2020, 11/22/2020   PFIZER(Purple Top)SARS-COV-2 Vaccination 05/09/2019, 05/30/2019, 02/09/2020   Pneumococcal Polysaccharide-23 10/24/2012   Td 10/09/2019   Tdap 05/19/2007   Zoster Recombinat (Shingrix) 11/02/2020    TDAP status: Up to date  Flu Vaccine status: Due, Education has been provided regarding the importance of this vaccine. Advised may receive this vaccine at local pharmacy or Health Dept. Aware to provide a copy of the vaccination record if obtained from local pharmacy or Health Dept. Verbalized acceptance and understanding.  Pneumococcal vaccine status: Due, Education has been provided regarding the importance of this vaccine. Advised may receive this vaccine at local pharmacy or Health Dept. Aware to provide a copy of the vaccination record if obtained from local pharmacy or Health Dept. Verbalized acceptance and  understanding.  Covid-19 vaccine status: Information provided on how to obtain vaccines.   Qualifies for Shingles Vaccine? Yes   Zostavax completed Yes   Shingrix Completed?: No.    Education has  been provided regarding the importance of this vaccine. Patient has been advised to call insurance company to determine out of pocket expense if they have not yet received this vaccine. Advised may also receive vaccine at local pharmacy or Health Dept. Verbalized acceptance and understanding.  Screening Tests Health Maintenance  Topic Date Due   COVID-19 Vaccine (4 - Pfizer risk series) 04/05/2020   Zoster Vaccines- Shingrix (2 of 2) 12/28/2020   Pneumonia Vaccine 19+ Years old (2 - PCV) 09/12/2021   DEXA SCAN  Never done   INFLUENZA VACCINE  09/16/2021   COLONOSCOPY (Pts 45-71yrs Insurance coverage will need to be confirmed)  11/04/2022 (Originally 09/12/2001)   MAMMOGRAM  10/14/2023   TETANUS/TDAP  10/08/2029   Hepatitis C Screening  Completed   HIV Screening  Completed   HPV VACCINES  Aged Out    Health Maintenance  Health Maintenance Due  Topic Date Due   COVID-19 Vaccine (4 - Pfizer risk series) 04/05/2020   Zoster Vaccines- Shingrix (2 of 2) 12/28/2020   Pneumonia Vaccine 42+ Years old (2 - PCV) 09/12/2021   DEXA SCAN  Never done   INFLUENZA VACCINE  09/16/2021    Colon Cancer Screening- patient refused screening  Mammogram status: Completed 10/13/21. Repeat every year  Bone Density status: Ordered 11/03/21. Pt provided with contact info and advised to call to schedule appt.  Lung Cancer Screening: (Low Dose CT Chest recommended if Age 73-80 years, 30 pack-year currently smoking OR have quit w/in 15years.) does not qualify.   Lung Cancer Screening Referral: N/A  Additional Screening:  Hepatitis C Screening: does qualify; Completed 11/22/20  Vision Screening: Recommended annual ophthalmology exams for early detection of glaucoma and other disorders of the eye. Is the patient up to date with their annual eye exam?  Yes  Who is the provider or what is the name of the office in which the patient attends annual eye exams? Dr. Ellin Mayhew If pt is not established with a  provider, would they like to be referred to a provider to establish care? No .   Dental Screening: Recommended annual dental exams for proper oral hygiene  Community Resource Referral / Chronic Care Management: CRR required this visit?  No   CCM required this visit?  No      Plan:     I have personally reviewed and noted the following in the patient's chart:   Medical and social history Use of alcohol, tobacco or illicit drugs  Current medications and supplements including opioid prescriptions. Patient is not currently taking opioid prescriptions. Functional ability and status Nutritional status Physical activity Advanced directives List of other physicians Hospitalizations, surgeries, and ER visits in previous 12 months Vitals Screenings to include cognitive, depression, and falls Referrals and appointments  In addition, I have reviewed and discussed with patient certain preventive protocols, quality metrics, and best practice recommendations. A written personalized care plan for preventive services as well as general preventive health recommendations were provided to patient.     Georgina Peer, [place]   11/03/2021   Nurse Notes: Non Face to Face 30 Minutes    Ms. Bennett Scrape , Thank you for taking time to come for your Medicare Wellness Visit. I appreciate your ongoing commitment to your health goals. Please review the following plan we discussed  and let me know if I can assist you in the future.   These are the goals we discussed:  Goals       PharmD "I can't afford my medications" (pt-stated)      Current Barriers:  Financial Barriers: RESOLVED, patient has [place] Medicaid coverage, though notes she continues to receive medications from [facility]; complex patient w/ multiple comordbidities including hx CVA (04/2018), HTN, HLD, asthma Reports residual deficits in her arm post stroke, but much improved since she has been engaging in weekly PT/OT ASCVD risk reduction:  atorvastatin 40 mg daily; ASA 81 mg daily (completed DAPT w/ Brilinta); LDL at goal <70 HTN: lisinopril 5 mg daily; BP higher than goal at last visit in October  Asthma: Advair 250/50 mcg BID, though patient reports she is taking QAM and not needing any rescue albuterol HFA recently Allergies: fluticasone nasal spray daily   Pharmacist Clinical Goal(s):  Over the next 90 days, patient will work with PharmD and providers to relieve medication access concerns  Interventions: Comprehensive medication management review performed, medication list updated in electronic medical record Reviewed that now that she is insured, she should no longer be receiving medications for free through [facility] She has follow up with PCP next week. Recommend that all refills be sent to a local pharmacy (eg [facility]) at this time.  Reviewed CCM team roles. She is interested in follow up from RN CM and LCSW, though denies any specific complaints or needs at this time. Will update team members.  Monitor BP at upcoming PCP appointment. Consider increase in lisinopril dose if needed  Patient Self Care Activities:  Patient will take medications as prescribed  Please see past updates related to this goal by clicking on the "Past Updates" button in the selected goal        SW-"I need to to gain more support and cope better with daily stressors" (pt-stated)      Current Barriers:  Financial constraints ADL IADL limitations Mental Health Concerns  Limited education about Medicaid benefits  Memory Deficits Inability to perform ADL's independently Inability to perform IADL's independently  Clinical Social Work Clinical Goal(s):  Over the next 90 days, client will work with SW to address concerns related to gaining financial assistance education and getting enrolled in social security.  Over the next 90 days, client will follow up with DSS Medicaid caseworker* as directed by SW  Interventions: Patient interviewed and  appropriate assessments performed Provided mental health counseling with regard to patient was emotional throughout phone call due to her daily difficulty with mobility/weakness/balance. LCSW provided reflective listening and implemented appropriate interventions to help suppport patient and her emotional needs  Patient reports that she was only approved for Medicaid  Family Planning but that she would be able to get Full Adult Medicaid if she were deemed disabled.  CSW used motivational interviewing techniques to engage pt in meaningful conversation and to assess pt's wilingess to implement appropriate self care daily to combat stressors/anxiety. CSW explored pt's coping skills and access to community services. Patient reports a decrease in her overall anxiety/stress/depression Patient reports ongoing active communication between her and her Medicaid caseworker.  Discussed plans with patient for ongoing care management follow up and provided patient with direct contact information for care management team Patient continues to receive $200 in confirms in food stamps per month Assisted patient with obtaining information about health plan benefits Patient confirms spouse provides ongoing caregiver support in the home. Patient denies any current social work  needs at this time. Positive reinforcement provided for ongoing healthy self-care implementation. Patient denies having mental health issues as well. Patient is agreeable to contact LCSW if future social work needs arise.   Patient Self Care Activities:  Attends all scheduled provider appointments Calls provider office for new concerns or questions  Please see past updates related to this goal by clicking on the "Past Updates" button in the selected goal          This is a list of the screening recommended for you and due dates:  Health Maintenance  Topic Date Due   COVID-19 Vaccine (4 - Pfizer risk series) 04/05/2020   Zoster (Shingles)  Vaccine (2 of 2) 12/28/2020   Pneumonia Vaccine (2 - PCV) 09/12/2021   DEXA scan (bone density measurement)  Never done   Flu Shot  09/16/2021   Colon Cancer Screening  11/04/2022*   Mammogram  10/14/2023   Tetanus Vaccine  10/08/2029   Hepatitis C Screening: USPSTF Recommendation to screen - Ages 18-79 yo.  Completed   HIV Screening  Completed   HPV Vaccine  Aged Out  *Topic was postponed. The date shown is not the original due date.

## 2021-11-03 NOTE — Patient Instructions (Signed)

## 2021-11-04 ENCOUNTER — Other Ambulatory Visit: Payer: Self-pay | Admitting: Nurse Practitioner

## 2021-11-04 ENCOUNTER — Telehealth: Payer: Self-pay | Admitting: Nurse Practitioner

## 2021-11-04 DIAGNOSIS — Z13820 Encounter for screening for osteoporosis: Secondary | ICD-10-CM

## 2021-11-04 NOTE — Telephone Encounter (Signed)
Copied from [place] [id number]. Topic: Medicare AWV >> Nov 04, 2021  4:16 PM Josephina Gip wrote: Reason for CRM:  N/A and VM not set up, unable to leave a message for patient to call back and schedule the Medicare Annual Wellness Visit (AWV) virtually or by telephone.  Last AWV 11/02/20  Please schedule at anytime with CFP-Nurse Health Advisor.   Any questions, please call me at [phone]

## 2021-11-05 ENCOUNTER — Other Ambulatory Visit: Payer: Medicare Other

## 2021-11-05 ENCOUNTER — Ambulatory Visit: Payer: Medicare Other

## 2021-11-05 DIAGNOSIS — N201 Calculus of ureter: Secondary | ICD-10-CM

## 2021-11-14 ENCOUNTER — Telehealth: Payer: Self-pay | Admitting: Urology

## 2021-11-14 ENCOUNTER — Encounter: Payer: Self-pay | Admitting: Urology

## 2021-11-24 ENCOUNTER — Ambulatory Visit: Payer: Medicare Other | Admitting: Nurse Practitioner

## 2021-11-30 NOTE — Patient Instructions (Signed)

## 2021-12-01 NOTE — Telephone Encounter (Signed)
error 

## 2021-12-02 ENCOUNTER — Ambulatory Visit (INDEPENDENT_AMBULATORY_CARE_PROVIDER_SITE_OTHER): Payer: Medicare Other | Admitting: Nurse Practitioner

## 2021-12-02 ENCOUNTER — Encounter: Payer: Self-pay | Admitting: Nurse Practitioner

## 2021-12-02 DIAGNOSIS — I700 Atherosclerosis of aorta: Secondary | ICD-10-CM | POA: Diagnosis not present

## 2021-12-02 DIAGNOSIS — J4540 Moderate persistent asthma, uncomplicated: Secondary | ICD-10-CM

## 2021-12-02 DIAGNOSIS — E782 Mixed hyperlipidemia: Secondary | ICD-10-CM

## 2021-12-02 DIAGNOSIS — R011 Cardiac murmur, unspecified: Secondary | ICD-10-CM | POA: Diagnosis not present

## 2021-12-02 DIAGNOSIS — G4733 Obstructive sleep apnea (adult) (pediatric): Secondary | ICD-10-CM

## 2021-12-02 DIAGNOSIS — R7301 Impaired fasting glucose: Secondary | ICD-10-CM | POA: Diagnosis not present

## 2021-12-02 DIAGNOSIS — Z23 Encounter for immunization: Secondary | ICD-10-CM | POA: Diagnosis not present

## 2021-12-02 DIAGNOSIS — Z8673 Personal history of transient ischemic attack (TIA), and cerebral infarction without residual deficits: Secondary | ICD-10-CM

## 2021-12-02 DIAGNOSIS — E559 Vitamin D deficiency, unspecified: Secondary | ICD-10-CM | POA: Diagnosis not present

## 2021-12-02 DIAGNOSIS — I10 Essential (primary) hypertension: Secondary | ICD-10-CM

## 2021-12-02 DIAGNOSIS — F5101 Primary insomnia: Secondary | ICD-10-CM | POA: Diagnosis not present

## 2021-12-02 DIAGNOSIS — N393 Stress incontinence (female) (male): Secondary | ICD-10-CM

## 2021-12-02 MED ORDER — FLUTICASONE-SALMETEROL 250-50 MCG/ACT IN AEPB: 1.0000 | INHALATION_SPRAY | Freq: Two times a day (BID) | RESPIRATORY_TRACT | 4 refills | Status: DC

## 2021-12-02 MED ORDER — ATORVASTATIN CALCIUM 40 MG PO TABS: 40.0000 mg | ORAL_TABLET | Freq: Every day | ORAL | 4 refills | Status: DC

## 2021-12-02 MED ORDER — FLUTICASONE PROPIONATE 50 MCG/ACT NA SUSP: NASAL | 4 refills | Status: DC

## 2021-12-02 MED ORDER — LISINOPRIL 5 MG PO TABS: 5.0000 mg | ORAL_TABLET | Freq: Every day | ORAL | 4 refills | Status: DC

## 2021-12-02 NOTE — Assessment & Plan Note (Signed)
Chronic, stable with BP stable on exam today and on home readings.  Continue current medication regimen and adjust as needed.  Continue daily BP checks at home and focus on DASH diet regimen.  Discussed strict goal BP <130/80 due to history of stroke.  Labs today: CBC, CMP, TSH.  Refills sent. Return in 6 months.

## 2021-12-02 NOTE — Assessment & Plan Note (Signed)
Chronic, stable.  Recommend use of CPAP 100% of the time, has benefit from this.

## 2021-12-02 NOTE — Assessment & Plan Note (Signed)
Systolic 2/6.  No symptoms.  Consider echo in future.

## 2021-12-02 NOTE — Assessment & Plan Note (Signed)
Ongoing and improved at this time with Melatonin and CPAP use.   Avoid Benadryl containing products.  Focus on sleep hygiene techniques.  If worsening consider addition of Trazodone.  Return in 6 months.

## 2021-12-02 NOTE — Assessment & Plan Note (Signed)
Improved right upper extremity and right lower extremity strength and function with OT on board.   Praised for dedication to OT and progress.  Continue focus on prevention with BP <130/90, A1c <7%, and LDL <70.  Lipid panel today.

## 2021-12-02 NOTE — Assessment & Plan Note (Signed)
Chronic.  Noted on imaging 10/30/20.  Continue statin daily and ASA for prevention.

## 2021-12-02 NOTE — Assessment & Plan Note (Signed)
Chronic, stable.  Continue daily use of pads as needed, consider PT for pelvic floor if worsening.  Recommend focus on Kegel exercises and pelvic floor strengthening.

## 2021-12-02 NOTE — Assessment & Plan Note (Signed)
Chronic, ongoing.  Continue current medication regimen and adjust as needed.  Consider spirometry next visit.

## 2021-12-02 NOTE — Assessment & Plan Note (Signed)
BMI 42.89 with HTN/HLD, history of CVA.  Recommended eating smaller high protein, low fat meals more frequently and exercising 30 mins a day 5 times a week with a goal of 10-15lb weight loss in the next 3 months. Patient voiced their understanding and motivation to adhere to these recommendations.

## 2021-12-02 NOTE — Assessment & Plan Note (Signed)
Chronic, stable.  Continue current medication regimen and adjust as needed.  Recent LDL at goal, well below neuro goal <70.  Lipid panel today.

## 2021-12-02 NOTE — Progress Notes (Signed)
t  BP 122/70 (BP Location: Left Arm, Patient Position: Sitting, Cuff Size: Large)   Pulse 66   Temp 98 F (36.7 C) (Oral)   Ht 5\' 3"  (1.6 m)   Wt 242 lb 1.6 oz (109.8 kg)   SpO2 96%   BMI 42.89 kg/m    Subjective:    Patient ID: Kristin Solis, female    DOB: 01/29/1956, 66 y.o.   MRN: [id number]  HPI: Kristin Solis is a 66 y.o. female  Chief Complaint  Patient presents with   Hyperlipidemia   Hypertension   Insomnia   Urinary Incontinence   A1c    HYPERTENSION / HYPERLIPIDEMIA Taking Lisinopril 5 MG, ASA, and Atorvastatin 40 MG daily.  Using CPAP nightly, 100% -- using only nose piece now.  Has history of CVA March 2020 and is followed by neurology, Dr. Leonie Man, saw in October 2021.  Has had OT in past which improved ROM.  Aortic atherosclerosis noted on imaging 10/30/20.   History of elevated glucose on labs, recent A1c 5.2% April. She does endorse diet has been a bit off lately, more munchies. Satisfied with current treatment? yes Duration of hypertension: chronic BP monitoring frequency: daily BP range: 134/60 this morning BP medication side effects: no Duration of hyperlipidemia: chronic Cholesterol medication side effects: no Cholesterol supplements: none Medication compliance: good compliance Aspirin: yes Recent stressors: no Recurrent headaches: no Visual changes: no Palpitations: no Dyspnea: no Chest pain: no Lower extremity edema: no Dizzy/lightheaded: no    ASTHMA Uses Advair every day, then Albuterol as needed.   Asthma status: stable Satisfied with current treatment?: yes Albuterol/rescue inhaler frequency: very rarely, only if going for walks Dyspnea frequency: none Wheezing frequency: none Cough frequency: none Nocturnal symptom frequency: none Limitation of activity: no Current upper respiratory symptoms: no Triggers: seasonal Home peak flows:none Last Spirometry: unknown Failed/intolerant to following asthma meds: none Asthma meds in  past: as above Aerochamber/spacer use: no Visits to ER or Urgent Care in past year: no Pneumovax: 10/24/2012 and updated today Influenza: Up to Date   INSOMNIA Currently Melatonin taking 10 MG.  CPAP is offering benefit to sleep pattern. Duration: months Satisfied with sleep quality: no Difficulty falling asleep: no Difficulty staying asleep: none Waking a few hours after sleep onset: none Early morning awakenings: none Daytime hypersomnolence: none Wakes feeling refreshed: no Good sleep hygiene: yes Apnea: yes Snoring: yes Depressed/anxious mood: no Recent stress: no Restless legs/nocturnal leg cramps: no Chronic pain/arthritis: no History of sleep study: yes Treatments attempted: CPAP and Melatonin  STRESS INCONTINENCE Sees urology, last saw 08/18/21 for history of kidney stones.  Had cystoscopy 08/12/21.  Continues to have occasional dribbling and incontinence episodes.   Dysuria: no Urinary frequency: no Urgency: no Small volume voids: no Symptom severity: no Urinary incontinence: occasional Foul odor: no Hematuria: no Abdominal pain: no Back pain: no Suprapubic pain/pressure: no Flank pain: no  Relevant past medical, surgical, family and social history reviewed and updated as indicated. Interim medical history since our last visit reviewed. Allergies and medications reviewed and updated.  Review of Systems  Constitutional:  Negative for activity change, appetite change, diaphoresis, fatigue and fever.  Respiratory:  Negative for cough, chest tightness and shortness of breath.   Cardiovascular:  Negative for chest pain, palpitations and leg swelling.  Gastrointestinal: Negative.   Neurological:  Negative for dizziness, syncope, speech difficulty, weakness, light-headedness, numbness and headaches.  Psychiatric/Behavioral: Negative.      Per HPI unless specifically indicated above  Objective:    BP 122/70 (BP Location: Left Arm, Patient Position: Sitting,  Cuff Size: Large)   Pulse 66   Temp 98 F (36.7 C) (Oral)   Ht 5\' 3"  (1.6 m)   Wt 242 lb 1.6 oz (109.8 kg)   SpO2 96%   BMI 42.89 kg/m   Wt Readings from Last 3 Encounters:  12/02/21 242 lb 1.6 oz (109.8 kg)  11/03/21 237 lb (107.5 kg)  09/12/21 234 lb (106.1 kg)    Physical Exam Vitals and nursing note reviewed.  Constitutional:      General: She is awake. She is not in acute distress.    Appearance: She is well-developed. She is obese. She is not ill-appearing.  HENT:     Head: Normocephalic.     Right Ear: Hearing, ear canal and external ear normal. No drainage.     Left Ear: Hearing, ear canal and external ear normal. No drainage.  Eyes:     General: Lids are normal.        Right eye: No discharge.        Left eye: No discharge.     Conjunctiva/sclera: Conjunctivae normal.     Pupils: Pupils are equal, round, and reactive to light.  Neck:     Thyroid: No thyromegaly.     Vascular: No carotid bruit.  Cardiovascular:     Rate and Rhythm: Normal rate and regular rhythm.     Heart sounds: Murmur heard.     Systolic murmur is present with a grade of 2/6.     No gallop.  Pulmonary:     Effort: Pulmonary effort is normal. No accessory muscle usage or respiratory distress.     Breath sounds: Normal breath sounds.  Abdominal:     General: Bowel sounds are normal.     Palpations: Abdomen is soft.  Musculoskeletal:     Right hand: Normal.     Left hand: Normal.     Cervical back: Normal range of motion and neck supple.     Right lower leg: No edema.     Left lower leg: No edema.  Skin:    General: Skin is warm and dry.  Neurological:     Mental Status: She is alert and oriented to person, place, and time.  Psychiatric:        Attention and Perception: Attention normal.        Mood and Affect: Mood normal.        Speech: Speech normal.        Behavior: Behavior normal. Behavior is cooperative.        Thought Content: Thought content normal.    Results for orders  placed or performed during the hospital encounter of 08/12/21  Calculi, with Photograph (to Clinical Lab)  Result Value Ref Range   Source Calculi Comment    Color Calculi Brown    Size Calculi 2x3 mm   Weight Calculi 10 mg   Composition Calculi Comment    Calcium Oxalate Monohydrate 65 %   Calcium Oxalate Dihydrate 30 %   Hydroxyapatite 5 %   Comment Calculi 1 Comment    Photo Calculi Comment    Comment Calculi 3 Comment    Please Note: Comment    DISCLAIMER: Comment       Assessment & Plan:   Problem List Items Addressed This Visit       Cardiovascular and Mediastinum   Aortic atherosclerosis ([place])    Chronic.  Noted on imaging  10/30/20.  Continue statin daily and ASA for prevention.      Relevant Medications   lisinopril (ZESTRIL) 5 MG tablet   atorvastatin (LIPITOR) 40 MG tablet   Other Relevant Orders   Comprehensive metabolic panel   Lipid Panel w/o Chol/HDL Ratio   Essential hypertension, benign    Chronic, stable with BP stable on exam today and on home readings.  Continue current medication regimen and adjust as needed.  Continue daily BP checks at home and focus on DASH diet regimen.  Discussed strict goal BP <130/80 due to history of stroke.  Labs today: CBC, CMP, TSH.  Refills sent. Return in 6 months.      Relevant Medications   lisinopril (ZESTRIL) 5 MG tablet   atorvastatin (LIPITOR) 40 MG tablet   Other Relevant Orders   CBC with Differential/Platelet   Comprehensive metabolic panel   TSH     Respiratory   Asthma    Chronic, ongoing.  Continue current medication regimen and adjust as needed.  Consider spirometry next visit.        Relevant Medications   fluticasone-salmeterol (ADVAIR) 250-50 MCG/ACT AEPB   Other Relevant Orders   CBC with Differential/Platelet   Obstructive sleep apnea    Chronic, stable.  Recommend use of CPAP 100% of the time, has benefit from this.      Relevant Orders   CBC with Differential/Platelet     Endocrine    IFG (impaired fasting glucose)    Recent A1c improved, has cheated on diet past months.  Check A1c today and treat as needed.      Relevant Orders   HgB A1c     Other   Heart murmur    Systolic 2/6.  No symptoms.  Consider echo in future.      Relevant Orders   Comprehensive metabolic panel   History of stroke    Improved right upper extremity and right lower extremity strength and function with OT on board.   Praised for dedication to OT and progress.  Continue focus on prevention with BP <130/90, A1c <7%, and LDL <70.  Lipid panel today.      Relevant Orders   Comprehensive metabolic panel   Lipid Panel w/o Chol/HDL Ratio   Insomnia    Ongoing and improved at this time with Melatonin and CPAP use.   Avoid Benadryl containing products.  Focus on sleep hygiene techniques.  If worsening consider addition of Trazodone.  Return in 6 months.      Mixed hyperlipidemia    Chronic, stable.  Continue current medication regimen and adjust as needed.  Recent LDL at goal, well below neuro goal <70.  Lipid panel today.      Relevant Medications   lisinopril (ZESTRIL) 5 MG tablet   atorvastatin (LIPITOR) 40 MG tablet   Other Relevant Orders   Comprehensive metabolic panel   Lipid Panel w/o Chol/HDL Ratio   Morbid obesity ([facility]) - Primary    BMI 42.89 with HTN/HLD, history of CVA.  Recommended eating smaller high protein, low fat meals more frequently and exercising 30 mins a day 5 times a week with a goal of 10-15lb weight loss in the next 3 months. Patient voiced their understanding and motivation to adhere to these recommendations.       Stress incontinence    Chronic, stable.  Continue daily use of pads as needed, consider PT for pelvic floor if worsening.  Recommend focus on Kegel exercises and pelvic floor strengthening.  Other Visit Diagnoses     Vitamin D deficiency       History of low levels reported, check today and start supplement as needed.   Relevant Orders    VITAMIN D 25 Hydroxy (Vit-D Deficiency, Fractures)   Flu vaccine need       Flu vaccine in office today, educated patient.   Relevant Orders   Flu Vaccine QUAD High Dose(Fluad) (Completed)   Pneumococcal vaccination given       PCV20 in office today, educated patient.   Relevant Orders   Pneumococcal conjugate vaccine 20-valent (Prevnar 20) (Completed)        Follow up plan: Return in about 6 months (around 06/03/2022) for HTN/HLD, OSA, IFG, ASTHMA -- spirometry needed.

## 2021-12-02 NOTE — Assessment & Plan Note (Signed)
Recent A1c improved, has cheated on diet past months.  Check A1c today and treat as needed.

## 2021-12-03 LAB — CBC WITH DIFFERENTIAL/PLATELET
Basophils Absolute: 0 10*3/uL (ref 0.0–0.2)
Basos: 1 %
EOS (ABSOLUTE): 0.1 10*3/uL (ref 0.0–0.4)
Eos: 2 %
Hematocrit: 41.7 % (ref 34.0–46.6)
Hemoglobin: 13.9 g/dL (ref 11.1–15.9)
Immature Grans (Abs): 0 10*3/uL (ref 0.0–0.1)
Immature Granulocytes: 0 %
Lymphocytes Absolute: 1.4 10*3/uL (ref 0.7–3.1)
Lymphs: 23 %
MCH: 29.6 pg (ref 26.6–33.0)
MCHC: 33.3 g/dL (ref 31.5–35.7)
MCV: 89 fL (ref 79–97)
Monocytes Absolute: 0.6 10*3/uL (ref 0.1–0.9)
Monocytes: 9 %
Neutrophils Absolute: 4 10*3/uL (ref 1.4–7.0)
Neutrophils: 65 %
Platelets: 252 10*3/uL (ref 150–450)
RBC: 4.7 x10E6/uL (ref 3.77–5.28)
RDW: 13.3 % (ref 11.7–15.4)
WBC: 6.1 10*3/uL (ref 3.4–10.8)

## 2021-12-03 LAB — COMPREHENSIVE METABOLIC PANEL
ALT: 23 IU/L (ref 0–32)
AST: 21 IU/L (ref 0–40)
Albumin/Globulin Ratio: 1.6 (ref 1.2–2.2)
Albumin: 4 g/dL (ref 3.9–4.9)
Alkaline Phosphatase: 134 IU/L — ABNORMAL HIGH (ref 44–121)
BUN/Creatinine Ratio: 19 (ref 12–28)
BUN: 18 mg/dL (ref 8–27)
Bilirubin Total: 0.3 mg/dL (ref 0.0–1.2)
CO2: 23 mmol/L (ref 20–29)
Calcium: 9.6 mg/dL (ref 8.7–10.3)
Chloride: 106 mmol/L (ref 96–106)
Creatinine, Ser: 0.96 mg/dL (ref 0.57–1.00)
Globulin, Total: 2.5 g/dL (ref 1.5–4.5)
Glucose: 88 mg/dL (ref 70–99)
Potassium: 4.2 mmol/L (ref 3.5–5.2)
Sodium: 142 mmol/L (ref 134–144)
Total Protein: 6.5 g/dL (ref 6.0–8.5)
eGFR: 66 mL/min/{1.73_m2} (ref >59)

## 2021-12-03 LAB — LIPID PANEL W/O CHOL/HDL RATIO
Cholesterol, Total: 142 mg/dL (ref 100–199)
HDL: 73 mg/dL (ref >39)
LDL Chol Calc (NIH): 58 mg/dL (ref 0–99)
Triglycerides: 51 mg/dL (ref 0–149)
VLDL Cholesterol Cal: 11 mg/dL (ref 5–40)

## 2021-12-03 LAB — HEMOGLOBIN A1C
Est. average glucose Bld gHb Est-mCnc: 120 mg/dL
Hgb A1c MFr Bld: 5.8 % — ABNORMAL HIGH (ref 4.8–5.6)

## 2021-12-03 LAB — TSH: TSH: 1.86 u[IU]/mL (ref 0.450–4.500)

## 2021-12-03 LAB — VITAMIN D 25 HYDROXY (VIT D DEFICIENCY, FRACTURES): Vit D, 25-Hydroxy: 33.8 ng/mL (ref 30.0–100.0)

## 2021-12-03 NOTE — Progress Notes (Signed)
Contacted via [place] afternoon [place], your labs have returned and overall these continue to be stable and at baseline for you.  No medication changes needed.  Great job!!  [employer] proud.  Still some prediabetes, continue focus on diet and regular activity. Any questions? Keep being inspiring!!  Thank you for allowing me to participate in your care.  I appreciate you. Kindest regards, Aloise Copus

## 2021-12-17 ENCOUNTER — Encounter: Payer: Self-pay | Admitting: Nurse Practitioner

## 2021-12-17 ENCOUNTER — Ambulatory Visit
Admission: RE | Admit: 2021-12-17 | Discharge: 2021-12-17 | Disposition: A | Discharge status: Home / Self Care | Payer: Medicare Other | Source: Ambulatory Visit | Attending: Nurse Practitioner | Admitting: Nurse Practitioner

## 2021-12-17 DIAGNOSIS — Z13820 Encounter for screening for osteoporosis: Secondary | ICD-10-CM | POA: Insufficient documentation

## 2021-12-17 DIAGNOSIS — M85851 Other specified disorders of bone density and structure, right thigh: Secondary | ICD-10-CM | POA: Insufficient documentation

## 2021-12-17 DIAGNOSIS — Z780 Asymptomatic menopausal state: Secondary | ICD-10-CM | POA: Insufficient documentation

## 2021-12-17 NOTE — Progress Notes (Signed)
Contacted via [place] afternoon [place], your bone density returned.  Your bone density shows thinning bones (osteopenia) but not brittle (osteoporosis). We recommend Vitamin D supplementation of about 2,0000 IUs of over the counter Vitamin D3. In addition, we recommend a diet high in calcium with dairy and dark green leafy vegetables. We would like you to get plenty of weight bearing exercises with walking and resistance training such as light weights or resistance bands available with instructions at places such as [employer].  Any questions? Keep being amazing!!  Thank you for allowing me to participate in your care.  I appreciate you. Kindest regards, Allana Shrestha

## 2022-02-05 DIAGNOSIS — H2513 Age-related nuclear cataract, bilateral: Secondary | ICD-10-CM | POA: Diagnosis not present

## 2022-02-05 DIAGNOSIS — H5203 Hypermetropia, bilateral: Secondary | ICD-10-CM | POA: Diagnosis not present

## 2022-02-05 DIAGNOSIS — H40013 Open angle with borderline findings, low risk, bilateral: Secondary | ICD-10-CM | POA: Diagnosis not present

## 2022-02-05 DIAGNOSIS — H04123 Dry eye syndrome of bilateral lacrimal glands: Secondary | ICD-10-CM | POA: Diagnosis not present

## 2022-03-09 ENCOUNTER — Encounter: Payer: Self-pay | Admitting: Nurse Practitioner

## 2022-03-09 ENCOUNTER — Ambulatory Visit (INDEPENDENT_AMBULATORY_CARE_PROVIDER_SITE_OTHER): Payer: 59 | Admitting: Nurse Practitioner

## 2022-03-09 ENCOUNTER — Ambulatory Visit: Payer: Self-pay | Admitting: *Deleted

## 2022-03-09 DIAGNOSIS — I890 Lymphedema, not elsewhere classified: Secondary | ICD-10-CM | POA: Diagnosis not present

## 2022-03-09 DIAGNOSIS — L03115 Cellulitis of right lower limb: Secondary | ICD-10-CM | POA: Diagnosis not present

## 2022-03-09 DIAGNOSIS — L0390 Cellulitis, unspecified: Secondary | ICD-10-CM | POA: Insufficient documentation

## 2022-03-09 DIAGNOSIS — M79605 Pain in left leg: Secondary | ICD-10-CM | POA: Diagnosis not present

## 2022-03-09 MED ORDER — DOXYCYCLINE HYCLATE 100 MG PO TABS: 100.0000 mg | ORAL_TABLET | Freq: Two times a day (BID) | ORAL | 0 refills | Status: AC

## 2022-03-09 MED ORDER — METHYLPREDNISOLONE 4 MG PO TBPK: ORAL_TABLET | ORAL | 0 refills | Status: DC

## 2022-03-09 NOTE — Progress Notes (Signed)
BP 132/80   Pulse 74   Temp 97.7 F (36.5 C) (Oral)   Ht 5' 2.99" (1.6 m)   Wt 241 lb 1.6 oz (109.4 kg)   SpO2 96%   BMI 42.72 kg/m    Subjective:    Patient ID: Kristin Solis, female    DOB: 12-Jul-1955, 67 y.o.   MRN: [id number]  HPI: Kristin Solis is a 67 y.o. female  Chief Complaint  Patient presents with   Leg Pain    2 weeks after Christmas left lower leg pain started   LEG PAIN Left sided leg pain started about 2 weeks ago after Christmas.  Was getting out of truck with right leg on ground and then left leg up in truck, pulled it some/overextended and had pain immediately at that time. Has been trying to rest it and stay off of it, which is not helping.  She denies increased swelling or redness to leg.   Duration: weeks Pain: yes Severity: 6/10  Quality:  dull, aching, and throbbing Location:   around and below knee area Bilateral:  no Onset: sudden Frequency: intermittent Time of  day:   at random Sudden unintentional leg jerking:   no Paresthesias:   no Decreased sensation:  no Weakness:   no Insomnia:   no Fatigue:   no Alleviating factors: Tylenol helps a little bit Aggravating factors: getting out of bed and standing up Status:  about the same Treatments attempted: Tylenol and rest.  Relevant past medical, surgical, family and social history reviewed and updated as indicated. Interim medical history since our last visit reviewed. Allergies and medications reviewed and updated.  Review of Systems  Constitutional:  Negative for activity change, appetite change, diaphoresis, fatigue and fever.  Respiratory:  Negative for cough, chest tightness and shortness of breath.   Cardiovascular:  Negative for chest pain, palpitations and leg swelling.  Gastrointestinal: Negative.   Musculoskeletal:  Positive for arthralgias.  Neurological:  Negative for dizziness, syncope, speech difficulty, weakness, light-headedness, numbness and headaches.   Psychiatric/Behavioral: Negative.      Per HPI unless specifically indicated above     Objective:    BP 132/80   Pulse 74   Temp 97.7 F (36.5 C) (Oral)   Ht 5' 2.99" (1.6 m)   Wt 241 lb 1.6 oz (109.4 kg)   SpO2 96%   BMI 42.72 kg/m   Wt Readings from Last 3 Encounters:  03/09/22 241 lb 1.6 oz (109.4 kg)  12/02/21 242 lb 1.6 oz (109.8 kg)  11/03/21 237 lb (107.5 kg)    Physical Exam Vitals and nursing note reviewed.  Constitutional:      General: She is awake. She is not in acute distress.    Appearance: She is well-developed. She is obese. She is not ill-appearing.  HENT:     Head: Normocephalic.     Right Ear: Hearing, ear canal and external ear normal. No drainage.     Left Ear: Hearing, ear canal and external ear normal. No drainage.  Eyes:     General: Lids are normal.        Right eye: No discharge.        Left eye: No discharge.     Conjunctiva/sclera: Conjunctivae normal.     Pupils: Pupils are equal, round, and reactive to light.  Neck:     Thyroid: No thyromegaly.     Vascular: No carotid bruit.  Cardiovascular:     Rate and Rhythm: Normal rate and  regular rhythm.     Heart sounds: Murmur heard.     Systolic murmur is present with a grade of 2/6.     No gallop.  Pulmonary:     Effort: Pulmonary effort is normal. No accessory muscle usage or respiratory distress.     Breath sounds: Normal breath sounds.  Abdominal:     General: Bowel sounds are normal.     Palpations: Abdomen is soft.  Musculoskeletal:     Cervical back: Normal range of motion and neck supple.     Right knee: Normal.     Left knee: No swelling, erythema or crepitus. Decreased range of motion. No tenderness. Normal alignment.     Instability Tests: Anterior drawer test negative. Posterior drawer test negative. Medial McMurray test negative and lateral McMurray test negative.     Right lower leg: No lacerations or tenderness. 1+ Edema present.     Left lower leg: No lacerations or  tenderness. 1+ Edema present.  Skin:    General: Skin is warm and dry.     Comments: Lymphedema bilateral lower legs, moderate erythema noted to right shin area with raised blisters.  No tenderness or warmth.  Neurological:     Mental Status: She is alert and oriented to person, place, and time.  Psychiatric:        Attention and Perception: Attention normal.        Mood and Affect: Mood normal.        Speech: Speech normal.        Behavior: Behavior normal. Behavior is cooperative.        Thought Content: Thought content normal.     Results for orders placed or performed in visit on 12/02/21  CBC with Differential/Platelet  Result Value Ref Range   WBC 6.1 3.4 - 10.8 x10E3/uL   RBC 4.70 3.77 - 5.28 x10E6/uL   Hemoglobin 13.9 11.1 - 15.9 g/dL   Hematocrit 11.9 14.7 - 46.6 %   MCV 89 79 - 97 fL   MCH 29.6 26.6 - 33.0 pg   MCHC 33.3 31.5 - 35.7 g/dL   RDW 82.9 56.2 - 13.0 %   Platelets 252 150 - 450 x10E3/uL   Neutrophils 65 Not Estab. %   Lymphs 23 Not Estab. %   Monocytes 9 Not Estab. %   Eos 2 Not Estab. %   Basos 1 Not Estab. %   Neutrophils Absolute 4.0 1.4 - 7.0 x10E3/uL   Lymphocytes Absolute 1.4 0.7 - 3.1 x10E3/uL   Monocytes Absolute 0.6 0.1 - 0.9 x10E3/uL   EOS (ABSOLUTE) 0.1 0.0 - 0.4 x10E3/uL   Basophils Absolute 0.0 0.0 - 0.2 x10E3/uL   Immature Granulocytes 0 Not Estab. %   Immature Grans (Abs) 0.0 0.0 - 0.1 x10E3/uL  Comprehensive metabolic panel  Result Value Ref Range   Glucose 88 70 - 99 mg/dL   BUN 18 8 - 27 mg/dL   Creatinine, Ser 8.65 0.57 - 1.00 mg/dL   eGFR 66 >78 IO/NGE/9.52   BUN/Creatinine Ratio 19 12 - 28   Sodium 142 134 - 144 mmol/L   Potassium 4.2 3.5 - 5.2 mmol/L   Chloride 106 96 - 106 mmol/L   CO2 23 20 - 29 mmol/L   Calcium 9.6 8.7 - 10.3 mg/dL   Total Protein 6.5 6.0 - 8.5 g/dL   Albumin 4.0 3.9 - 4.9 g/dL   Globulin, Total 2.5 1.5 - 4.5 g/dL   Albumin/Globulin Ratio 1.6 1.2 - 2.2   Bilirubin  Total 0.3 0.0 - 1.2 mg/dL   Alkaline  Phosphatase 134 (H) 44 - 121 IU/L   AST 21 0 - 40 IU/L   ALT 23 0 - 32 IU/L  Lipid Panel w/o Chol/HDL Ratio  Result Value Ref Range   Cholesterol, Total 142 100 - 199 mg/dL   Triglycerides 51 0 - 149 mg/dL   HDL 73 >39 mg/dL   VLDL Cholesterol Cal 11 5 - 40 mg/dL   LDL Chol Calc (NIH) 58 0 - 99 mg/dL  TSH  Result Value Ref Range   TSH 1.860 0.450 - 4.500 uIU/mL  VITAMIN D 25 Hydroxy (Vit-D Deficiency, Fractures)  Result Value Ref Range   Vit D, 25-Hydroxy 33.8 30.0 - 100.0 ng/mL  HgB A1c  Result Value Ref Range   Hgb A1c MFr Bld 5.8 (H) 4.8 - 5.6 %   Est. average glucose Bld gHb Est-mCnc 120 mg/dL      Assessment & Plan:   Problem List Items Addressed This Visit       Other   Cellulitis    Acute to right lower shin.  At this time start Doxycycline 100 MG BID for 7 days and recommend plenty of rest and elevation of area.  Refer to lymphedema plan of care.      Left leg pain    Acute from over extension, exam with no tenderness noted, negative Homans bilaterally.  At this time will send in steroid taper and recommend wearing knee sleeve when mobile.  Continue Tylenol as needed + recommend alternate ice and heat to area.  Can use OTC Voltaren gel or Icy/Hot.  Return in 2 weeks.      Lymphedema    Bilateral lower legs with mild edema.  Recommend use of compression socks, but she reports these are uncomfortable and has tried them multiple times.  Elevation of legs often.  May need vascular referral in future to discuss pumps.      Morbid obesity (Kristin Solis) - Primary    BMI 42.72 with HTN/HLD, history of CVA.  Recommended eating smaller high protein, low fat meals more frequently and exercising 30 mins a day 5 times a week with a goal of 10-15lb weight loss in the next 3 months. Patient voiced their understanding and motivation to adhere to these recommendations.         Follow up plan: Return in about 2 weeks (around 03/23/2022) for LEFT LEG PAIN.

## 2022-03-09 NOTE — Assessment & Plan Note (Signed)
BMI 42.72 with HTN/HLD, history of CVA.  Recommended eating smaller high protein, low fat meals more frequently and exercising 30 mins a day 5 times a week with a goal of 10-15lb weight loss in the next 3 months. Patient voiced their understanding and motivation to adhere to these recommendations.

## 2022-03-09 NOTE — Assessment & Plan Note (Signed)
Acute to right lower shin.  At this time start Doxycycline 100 MG BID for 7 days and recommend plenty of rest and elevation of area.  Refer to lymphedema plan of care.

## 2022-03-09 NOTE — Telephone Encounter (Signed)
  Chief Complaint: Left leg pain Symptoms: Feels like a pulled muscle Frequency: Happened about a week after Christmas while getting out of her truck.   Felt a shooting pain that went away and now it's throbbing like a tooth ache Pertinent Negatives: Patient denies injuries or swelling Disposition: [] ED /[] Urgent Care (no appt availability in office) / [x] Appointment(In office/virtual)/ []  [facility]/ [] Home Care/ [] Refused Recommended Disposition /[] [facility]/ []  Follow-up with PCP Additional Notes: Appt. Made for today with Marnee Guarneri, NP for 11:20.

## 2022-03-09 NOTE — Patient Instructions (Signed)
Sciatica  Sciatica is pain, weakness, tingling, or loss of feeling (numbness) along the sciatic nerve. The sciatic nerve starts in the lower back and goes down the back of each leg. Sciatica usually affects one side of the body. Sciatica usually goes away on its own or with treatment. Sometimes, sciatica may come back. What are the causes? This condition happens when the sciatic nerve is pinched or has pressure put on it. This may be caused by: A disk in between the bones of the spine bulging out too far (herniated disk). Changes in the spinal disks due to aging. A condition that affects a muscle in the butt. Extra bone growth near the sciatic nerve. A break (fracture) of the area between your hip bones (pelvis). Pregnancy. Tumor. This is rare. What increases the risk? You are more likely to develop this condition if you: Play sports that put pressure or stress on the spine. Have poor strength and ease of movement (flexibility). Have had a back injury or back surgery. Sit for long periods of time. Do activities that involve bending or lifting over and over again. Are very overweight (obese). What are the signs or symptoms? Symptoms can vary from mild to very bad. They may include: Any of these problems in the lower back, leg, hip, or butt: Mild tingling, loss of feeling, or dull aches. A burning feeling. Sharp pains. Loss of feeling in the back of the calf or the sole of the foot. Leg weakness. Very bad back pain that makes it hard to move. These symptoms may get worse when you cough, sneeze, or laugh. They may also get worse when you sit or stand for long periods of time. How is this treated? This condition often gets better without any treatment. However, treatment may include: Changing or cutting back on physical activity when you have pain. Exercising, including strengthening and stretching. Putting ice or heat on the affected area. Shots of medicines to relieve pain and  swelling or to relax your muscles. Surgery. Follow these instructions at home: Medicines Take over-the-counter and prescription medicines only as told by your doctor. Ask your doctor if you should avoid driving or using machines while you are taking your medicine. Managing pain     If told, put ice on the affected area. To do this: Put ice in a plastic bag. Place a towel between your skin and the bag. Leave the ice on for 20 minutes, 2-3 times a day. If your skin turns bright red, take off the ice right away to prevent skin damage. The risk of skin damage is higher if you cannot feel pain, heat, or cold. If told, put heat on the affected area. Do this as often as told by your doctor. Use the heat source that your doctor tells you to use, such as a moist heat pack or a heating pad. Place a towel between your skin and the heat source. Leave the heat on for 20-30 minutes. If your skin turns bright red, take off the heat right away to prevent burns. The risk of burns is higher if you cannot feel pain, heat, or cold. Activity  Return to your normal activities when your doctor says that it is safe. Avoid activities that make your symptoms worse. Take short rests during the day. When you rest for a long time, do some physical activity or stretching between periods of rest. Avoid sitting for a long time without moving. Get up and move around at least one time each   hour. Do exercises and stretches as told by your doctor. Do not lift anything that is heavier than 10 lb (4.5 kg). Avoid lifting heavy things even when you do not have symptoms. Avoid lifting heavy things over and over. When you lift objects, always lift in a way that is safe for your body. To do this, you should: Bend your knees. Keep the object close to your body. Avoid twisting. General instructions Stay at a healthy weight. Wear comfortable shoes that support your feet. Avoid wearing high heels. Avoid sleeping on a mattress  that is too soft or too hard. You might have less pain if you sleep on a mattress that is firm enough to support your back. Contact a doctor if: Your pain is not controlled by medicine. Your pain does not get better. Your pain gets worse. Your pain lasts longer than 4 weeks. You lose weight without trying. Get help right away if: You cannot control when you pee (urinate) or poop (have a bowel movement). You have weakness in any of these areas and it gets worse: Lower back. The area between your hip bones. Butt. Legs. You have redness or swelling of your back. You have a burning feeling when you pee. Summary Sciatica is pain, weakness, tingling, or loss of feeling (numbness) along the sciatic nerve. This may include the lower back, legs, hips, and butt. This condition happens when the sciatic nerve is pinched or has pressure put on it. Treatment often includes rest, exercise, medicines, and putting ice or heat on the affected area. This information is not intended to replace advice given to you by your health care provider. Make sure you discuss any questions you have with your health care provider. Document Revised: 05/12/2021 Document Reviewed: 05/12/2021 Elsevier Patient Education  2023 [employer]  

## 2022-03-09 NOTE — Telephone Encounter (Signed)
Reason for Disposition  [1] MODERATE pain (e.g., interferes with normal activities, limping) AND [2] present > 3 days  Answer Assessment - Initial Assessment Questions 1. ONSET: "When did the pain start?"      Having pain in left leg.    About a week after Christmas I was getting out of the truck.   My left foot was on the lower bar.   I got a shooting pain.   I stood there for 5 minutes and it went away.   I went on into the store and was fine.    Now it's throbbing like a tooth ache.     Not sore.  It's like a pulled muscle.   2. LOCATION: "Where is the pain located?"      Knee down it's throbbing now.    No swelling.   3. PAIN: "How bad is the pain?"    (Scale 1-10; or mild, moderate, severe)   -  MILD (1-3): doesn't interfere with normal activities    -  MODERATE (4-7): interferes with normal activities (e.g., work or school) or awakens from sleep, limping    -  SEVERE (8-10): excruciating pain, unable to do any normal activities, unable to walk     8/10 4. WORK OR EXERCISE: "Has there been any recent work or exercise that involved this part of the body?"      See above 5. CAUSE: "What do you think is causing the leg pain?"     Maybe a pulled muscle 6. OTHER SYMPTOMS: "Do you have any other symptoms?" (e.g., chest pain, back pain, breathing difficulty, swelling, rash, fever, numbness, weakness)     No   I'm limping but able to walk 7. PREGNANCY: "Is there any chance you are pregnant?" "When was your last menstrual period?"     N/A due to age  Protocols used: Leg Pain-A-AH

## 2022-03-09 NOTE — Assessment & Plan Note (Signed)
Bilateral lower legs with mild edema.  Recommend use of compression socks, but she reports these are uncomfortable and has tried them multiple times.  Elevation of legs often.  May need vascular referral in future to discuss pumps.

## 2022-03-09 NOTE — Assessment & Plan Note (Signed)
Acute from over extension, exam with no tenderness noted, negative Homans bilaterally.  At this time will send in steroid taper and recommend wearing knee sleeve when mobile.  Continue Tylenol as needed + recommend alternate ice and heat to area.  Can use OTC Voltaren gel or Icy/Hot.  Return in 2 weeks.

## 2022-03-18 ENCOUNTER — Ambulatory Visit: Payer: Medicare Other | Admitting: Urology

## 2022-03-19 ENCOUNTER — Telehealth: Payer: Self-pay

## 2022-03-19 NOTE — Telephone Encounter (Signed)
Paperwork faxed back to [facility]  

## 2022-03-22 NOTE — Patient Instructions (Signed)
Sciatica  Sciatica is pain, weakness, tingling, or loss of feeling (numbness) along the sciatic nerve. The sciatic nerve starts in the lower back and goes down the back of each leg. Sciatica usually affects one side of the body. Sciatica usually goes away on its own or with treatment. Sometimes, sciatica may come back. What are the causes? This condition happens when the sciatic nerve is pinched or has pressure put on it. This may be caused by: A disk in between the bones of the spine bulging out too far (herniated disk). Changes in the spinal disks due to aging. A condition that affects a muscle in the butt. Extra bone growth near the sciatic nerve. A break (fracture) of the area between your hip bones (pelvis). Pregnancy. Tumor. This is rare. What increases the risk? You are more likely to develop this condition if you: Play sports that put pressure or stress on the spine. Have poor strength and ease of movement (flexibility). Have had a back injury or back surgery. Sit for long periods of time. Do activities that involve bending or lifting over and over again. Are very overweight (obese). What are the signs or symptoms? Symptoms can vary from mild to very bad. They may include: Any of these problems in the lower back, leg, hip, or butt: Mild tingling, loss of feeling, or dull aches. A burning feeling. Sharp pains. Loss of feeling in the back of the calf or the sole of the foot. Leg weakness. Very bad back pain that makes it hard to move. These symptoms may get worse when you cough, sneeze, or laugh. They may also get worse when you sit or stand for long periods of time. How is this treated? This condition often gets better without any treatment. However, treatment may include: Changing or cutting back on physical activity when you have pain. Exercising, including strengthening and stretching. Putting ice or heat on the affected area. Shots of medicines to relieve pain and  swelling or to relax your muscles. Surgery. Follow these instructions at home: Medicines Take over-the-counter and prescription medicines only as told by your doctor. Ask your doctor if you should avoid driving or using machines while you are taking your medicine. Managing pain     If told, put ice on the affected area. To do this: Put ice in a plastic bag. Place a towel between your skin and the bag. Leave the ice on for 20 minutes, 2-3 times a day. If your skin turns bright red, take off the ice right away to prevent skin damage. The risk of skin damage is higher if you cannot feel pain, heat, or cold. If told, put heat on the affected area. Do this as often as told by your doctor. Use the heat source that your doctor tells you to use, such as a moist heat pack or a heating pad. Place a towel between your skin and the heat source. Leave the heat on for 20-30 minutes. If your skin turns bright red, take off the heat right away to prevent burns. The risk of burns is higher if you cannot feel pain, heat, or cold. Activity  Return to your normal activities when your doctor says that it is safe. Avoid activities that make your symptoms worse. Take short rests during the day. When you rest for a long time, do some physical activity or stretching between periods of rest. Avoid sitting for a long time without moving. Get up and move around at least one time each   hour. Do exercises and stretches as told by your doctor. Do not lift anything that is heavier than 10 lb (4.5 kg). Avoid lifting heavy things even when you do not have symptoms. Avoid lifting heavy things over and over. When you lift objects, always lift in a way that is safe for your body. To do this, you should: Bend your knees. Keep the object close to your body. Avoid twisting. General instructions Stay at a healthy weight. Wear comfortable shoes that support your feet. Avoid wearing high heels. Avoid sleeping on a mattress  that is too soft or too hard. You might have less pain if you sleep on a mattress that is firm enough to support your back. Contact a doctor if: Your pain is not controlled by medicine. Your pain does not get better. Your pain gets worse. Your pain lasts longer than 4 weeks. You lose weight without trying. Get help right away if: You cannot control when you pee (urinate) or poop (have a bowel movement). You have weakness in any of these areas and it gets worse: Lower back. The area between your hip bones. Butt. Legs. You have redness or swelling of your back. You have a burning feeling when you pee. Summary Sciatica is pain, weakness, tingling, or loss of feeling (numbness) along the sciatic nerve. This may include the lower back, legs, hips, and butt. This condition happens when the sciatic nerve is pinched or has pressure put on it. Treatment often includes rest, exercise, medicines, and putting ice or heat on the affected area. This information is not intended to replace advice given to you by your health care provider. Make sure you discuss any questions you have with your health care provider. Document Revised: 05/12/2021 Document Reviewed: 05/12/2021 Elsevier Patient Education  2023 [employer]  

## 2022-03-23 ENCOUNTER — Ambulatory Visit: Payer: Medicare Other | Admitting: Urology

## 2022-03-23 ENCOUNTER — Ambulatory Visit: Payer: 59 | Admitting: Nurse Practitioner

## 2022-03-25 ENCOUNTER — Ambulatory Visit (INDEPENDENT_AMBULATORY_CARE_PROVIDER_SITE_OTHER): Payer: 59 | Admitting: Nurse Practitioner

## 2022-03-25 ENCOUNTER — Encounter: Payer: Self-pay | Admitting: Nurse Practitioner

## 2022-03-25 VITALS — BP 128/78 | HR 67 | Temp 98.0°F | Ht 62.99 in | Wt 241.5 lb

## 2022-03-25 DIAGNOSIS — M79605 Pain in left leg: Secondary | ICD-10-CM | POA: Diagnosis not present

## 2022-03-25 DIAGNOSIS — I890 Lymphedema, not elsewhere classified: Secondary | ICD-10-CM

## 2022-03-25 DIAGNOSIS — L03115 Cellulitis of right lower limb: Secondary | ICD-10-CM | POA: Diagnosis not present

## 2022-03-25 NOTE — Assessment & Plan Note (Signed)
Acute to right lower shin.  Improved at this time.  Refer to lymphedema plan of care.

## 2022-03-25 NOTE — Assessment & Plan Note (Signed)
Acute from over extension, exam with no tenderness noted, negative Homans bilaterally.  No improvement with steroid taper - will send to ortho for further recommendations.  Continue Tylenol as needed + recommend alternate ice and heat to area.  Can use OTC Voltaren gel or Icy/Hot.  Use knee support sleeve.

## 2022-03-25 NOTE — Assessment & Plan Note (Signed)
Bilateral lower legs with mild edema.  Recommend use of compression socks, but she reports these are uncomfortable and has tried them multiple times.  Elevation of legs often.  May need vascular referral in future to discuss pumps.

## 2022-03-25 NOTE — Progress Notes (Signed)
BP 128/78 (BP Location: Left Arm, Patient Position: Sitting, Cuff Size: Large)   Pulse 67   Temp 98 F (36.7 C) (Oral)   Ht 5' 2.99" (1.6 m)   Wt 241 lb 8 oz (109.5 kg)   SpO2 97%   BMI 42.79 kg/m    Subjective:    Patient ID: [name], female    DOB: 06-Sep-1955, 67 y.o.   MRN: [id number]  HPI: LASHAUN POCH is a 67 y.o. female  Chief Complaint  Patient presents with   Leg Pain    Here for follow up on left leg pain   LEG PAIN Follow-up for leg pain this morning -- last visit started steroid taper for pain and started some Doxycycline for redness and cellulitis right lower leg.  Left sided leg pain started about 2 weeks after Christmas.  Happened when getting out of truck with right leg on ground and then left leg up in truck, pulled it some/overextended and had pain immediately at that time. Has been trying to rest it and stay off of it, which is not helping.  She denies increased swelling or redness to leg.    Pain has not improved, cellulitis to right leg has improved.  Continues to use cane.  She finds wearing brace to knee helps.   Duration: weeks Pain: yes Severity: 9/10 this morning Quality:  dull, aching, and throbbing Location:   around and below knee area Bilateral:  no Onset: sudden Frequency: intermittent Time of  day:   at random Sudden unintentional leg jerking:   no Paresthesias:   no Decreased sensation:  no Weakness:   no Insomnia:   no Fatigue:   no Alleviating factors: Tylenol helps a little bit Aggravating factors: getting out of bed and standing up Status: no change -- continues to hurt Treatments attempted: Tylenol, rest, steroid taper, brace to knee  Relevant past medical, surgical, family and social history reviewed and updated as indicated. Interim medical history since our last visit reviewed. Allergies and medications reviewed and updated.  Review of Systems  Constitutional:  Negative for activity change, appetite change,  diaphoresis, fatigue and fever.  Respiratory:  Negative for cough, chest tightness and shortness of breath.   Cardiovascular:  Negative for chest pain, palpitations and leg swelling.  Gastrointestinal: Negative.   Musculoskeletal:  Positive for arthralgias.  Neurological:  Negative for dizziness, syncope, speech difficulty, weakness, light-headedness, numbness and headaches.  Psychiatric/Behavioral: Negative.      Per HPI unless specifically indicated above     Objective:    BP 128/78 (BP Location: Left Arm, Patient Position: Sitting, Cuff Size: Large)   Pulse 67   Temp 98 F (36.7 C) (Oral)   Ht 5' 2.99" (1.6 m)   Wt 241 lb 8 oz (109.5 kg)   SpO2 97%   BMI 42.79 kg/m   Wt Readings from Last 3 Encounters:  03/25/22 241 lb 8 oz (109.5 kg)  03/09/22 241 lb 1.6 oz (109.4 kg)  12/02/21 242 lb 1.6 oz (109.8 kg)    Physical Exam Vitals and nursing note reviewed.  Constitutional:      General: She is awake. She is not in acute distress.    Appearance: She is well-developed. She is obese. She is not ill-appearing.  HENT:     Head: Normocephalic.     Right Ear: Hearing, ear canal and external ear normal. No drainage.     Left Ear: Hearing, ear canal and external ear normal. No drainage.  Eyes:     General: Lids are normal.        Right eye: No discharge.        Left eye: No discharge.     Conjunctiva/sclera: Conjunctivae normal.     Pupils: Pupils are equal, round, and reactive to light.  Neck:     Thyroid: No thyromegaly.     Vascular: No carotid bruit.  Cardiovascular:     Rate and Rhythm: Normal rate and regular rhythm.     Heart sounds: Murmur heard.     Systolic murmur is present with a grade of 2/6.     No gallop.  Pulmonary:     Effort: Pulmonary effort is normal. No accessory muscle usage or respiratory distress.     Breath sounds: Normal breath sounds.  Abdominal:     General: Bowel sounds are normal.     Palpations: Abdomen is soft.  Musculoskeletal:      Cervical back: Normal range of motion and neck supple.     Right knee: Normal.     Left knee: No swelling, erythema or crepitus. Decreased range of motion. No tenderness. Normal alignment.     Instability Tests: Anterior drawer test negative. Posterior drawer test negative. Medial McMurray test negative and lateral McMurray test negative.     Right lower leg: No lacerations or tenderness. 1+ Edema present.     Left lower leg: No lacerations or tenderness. 1+ Edema present.     Comments: Using cane.  Antalgic gait.  Skin:    General: Skin is warm and dry.     Comments: Lymphedema bilateral lower legs, improved erythema and edema on exam today.  No tenderness or warmth.  Neurological:     Mental Status: She is alert and oriented to person, place, and time.  Psychiatric:        Attention and Perception: Attention normal.        Mood and Affect: Mood normal.        Speech: Speech normal.        Behavior: Behavior normal. Behavior is cooperative.        Thought Content: Thought content normal.    Results for orders placed or performed in visit on 12/02/21  CBC with Differential/Platelet  Result Value Ref Range   WBC 6.1 3.4 - 10.8 x10E3/uL   RBC 4.70 3.77 - 5.28 x10E6/uL   Hemoglobin 13.9 11.1 - 15.9 g/dL   Hematocrit 41.7 34.0 - 46.6 %   MCV 89 79 - 97 fL   MCH 29.6 26.6 - 33.0 pg   MCHC 33.3 31.5 - 35.7 g/dL   RDW 13.3 11.7 - 15.4 %   Platelets 252 150 - 450 x10E3/uL   Neutrophils 65 Not Estab. %   Lymphs 23 Not Estab. %   Monocytes 9 Not Estab. %   Eos 2 Not Estab. %   Basos 1 Not Estab. %   Neutrophils Absolute 4.0 1.4 - 7.0 x10E3/uL   Lymphocytes Absolute 1.4 0.7 - 3.1 x10E3/uL   Monocytes Absolute 0.6 0.1 - 0.9 x10E3/uL   EOS (ABSOLUTE) 0.1 0.0 - 0.4 x10E3/uL   Basophils Absolute 0.0 0.0 - 0.2 x10E3/uL   Immature Granulocytes 0 Not Estab. %   Immature Grans (Abs) 0.0 0.0 - 0.1 x10E3/uL  Comprehensive metabolic panel  Result Value Ref Range   Glucose 88 70 - 99 mg/dL    BUN 18 8 - 27 mg/dL   Creatinine, Ser 0.96 0.57 - 1.00 mg/dL   eGFR 66 >  59 mL/min/1.73   BUN/Creatinine Ratio 19 12 - 28   Sodium 142 134 - 144 mmol/L   Potassium 4.2 3.5 - 5.2 mmol/L   Chloride 106 96 - 106 mmol/L   CO2 23 20 - 29 mmol/L   Calcium 9.6 8.7 - 10.3 mg/dL   Total Protein 6.5 6.0 - 8.5 g/dL   Albumin 4.0 3.9 - 4.9 g/dL   Globulin, Total 2.5 1.5 - 4.5 g/dL   Albumin/Globulin Ratio 1.6 1.2 - 2.2   Bilirubin Total 0.3 0.0 - 1.2 mg/dL   Alkaline Phosphatase 134 (H) 44 - 121 IU/L   AST 21 0 - 40 IU/L   ALT 23 0 - 32 IU/L  Lipid Panel w/o Chol/HDL Ratio  Result Value Ref Range   Cholesterol, Total 142 100 - 199 mg/dL   Triglycerides 51 0 - 149 mg/dL   HDL 73 >39 mg/dL   VLDL Cholesterol Cal 11 5 - 40 mg/dL   LDL Chol Calc (NIH) 58 0 - 99 mg/dL  TSH  Result Value Ref Range   TSH 1.860 0.450 - 4.500 uIU/mL  VITAMIN D 25 Hydroxy (Vit-D Deficiency, Fractures)  Result Value Ref Range   Vit D, 25-Hydroxy 33.8 30.0 - 100.0 ng/mL  HgB A1c  Result Value Ref Range   Hgb A1c MFr Bld 5.8 (H) 4.8 - 5.6 %   Est. average glucose Bld gHb Est-mCnc 120 mg/dL      Assessment & Plan:   Problem List Items Addressed This Visit       Other   Cellulitis    Acute to right lower shin.  Improved at this time.  Refer to lymphedema plan of care.      Left leg pain - Primary    Acute from over extension, exam with no tenderness noted, negative Homans bilaterally.  No improvement with steroid taper - will send to ortho for further recommendations.  Continue Tylenol as needed + recommend alternate ice and heat to area.  Can use OTC Voltaren gel or Icy/Hot.  Use knee support sleeve.      Relevant Orders   Ambulatory referral to Orthopedics   Lymphedema    Bilateral lower legs with mild edema.  Recommend use of compression socks, but she reports these are uncomfortable and has tried them multiple times.  Elevation of legs often.  May need vascular referral in future to discuss pumps.         Follow up plan: Return for scheduled April 17th.

## 2022-04-07 DIAGNOSIS — G8929 Other chronic pain: Secondary | ICD-10-CM | POA: Diagnosis not present

## 2022-04-07 DIAGNOSIS — M1712 Unilateral primary osteoarthritis, left knee: Secondary | ICD-10-CM | POA: Diagnosis not present

## 2022-04-09 ENCOUNTER — Telehealth: Payer: Self-pay

## 2022-04-09 NOTE — Telephone Encounter (Signed)
Paperwork faxed back to [facility]

## 2022-05-07 ENCOUNTER — Ambulatory Visit: Payer: Medicare Other | Admitting: Urology

## 2022-05-26 ENCOUNTER — Other Ambulatory Visit: Payer: Self-pay | Admitting: Nurse Practitioner

## 2022-05-27 NOTE — Telephone Encounter (Signed)
Requested Prescriptions  Pending Prescriptions Disp Refills   fluticasone (FLONASE) 50 MCG/ACT nasal spray [Pharmacy Med Name: Fluticasone Propionate 50 MCG/ACT Nasal Suspension] 16 g 0    Sig: Use 2 spray(s) in each nostril once daily     Ear, Nose, and Throat: Nasal Preparations - Corticosteroids Passed - 05/26/2022 10:03 AM      Passed - Valid encounter within last 12 months    Recent Outpatient Visits           2 months ago Left leg pain   [place] [facility] [place], [place] T, NP   2 months ago Morbid obesity (HCC)   [place] [facility] [place], Corrie Dandy T, NP   5 months ago Morbid obesity (HCC)   [place] [facility] [place], Corrie Dandy T, NP   10 months ago Tick bite of left shoulder, initial encounter   [place] [facility] [place], Oswaldo Conroy, PA-C   1 year ago Aortic atherosclerosis (HCC)   [place] [facility] [place], [name], NP       Future Appointments             In 1 week [name], [name], NP [facility]

## 2022-05-30 NOTE — Patient Instructions (Signed)
Be Involved in Your Health Care:  Taking Medications When medications are taken as directed, they can greatly improve your health. But if they are not taken as instructed, they may not work. In some cases, not taking them correctly can be harmful. To help ensure your treatment remains effective and safe, understand your medications and how to take them.  Your lab results, notes and after visit summary will be available on My Chart. We strongly encourage you to use this feature. If lab results are abnormal the clinic will contact you with the appropriate steps. If the clinic does not contact you assume the results are satisfactory. You can always see your results on My Chart. If you have questions regarding your condition, please contact the clinic during office hours. You can also ask questions on My Chart.  We at [facility] are grateful that you chose us to provide care. We strive to provide excellent and compassionate care and are always looking for feedback. If you get a survey from the clinic please complete this.   DASH Eating Plan DASH stands for Dietary Approaches to Stop Hypertension. The DASH eating plan is a healthy eating plan that has been shown to: Reduce high blood pressure (hypertension). Reduce your risk for type 2 diabetes, heart disease, and stroke. Help with weight loss. What are tips for following this plan? Reading food labels Check food labels for the amount of salt (sodium) per serving. Choose foods with less than 5 percent of the Daily Value of sodium. Generally, foods with less than 300 milligrams (mg) of sodium per serving fit into this eating plan. To find whole grains, look for the word "whole" as the first word in the ingredient list. Shopping Buy products labeled as "low-sodium" or "no salt added." Buy fresh foods. Avoid canned foods and pre-made or frozen meals. Cooking Avoid adding salt when cooking. Use salt-free seasonings or herbs instead of  table salt or sea salt. Check with your health care provider or pharmacist before using salt substitutes. Do not fry foods. Cook foods using healthy methods such as baking, boiling, grilling, roasting, and broiling instead. Cook with heart-healthy oils, such as olive, canola, avocado, soybean, or sunflower oil. Meal planning  Eat a balanced diet that includes: 4 or more servings of fruits and 4 or more servings of vegetables each day. Try to fill one-half of your plate with fruits and vegetables. 6-8 servings of whole grains each day. Less than 6 oz (170 g) of lean meat, poultry, or fish each day. A 3-oz (85-g) serving of meat is about the same size as a deck of cards. One egg equals 1 oz (28 g). 2-3 servings of low-fat dairy each day. One serving is 1 cup (237 mL). 1 serving of nuts, seeds, or beans 5 times each week. 2-3 servings of heart-healthy fats. Healthy fats called omega-3 fatty acids are found in foods such as walnuts, flaxseeds, fortified milks, and eggs. These fats are also found in cold-water fish, such as sardines, salmon, and mackerel. Limit how much you eat of: Canned or prepackaged foods. Food that is high in trans fat, such as some fried foods. Food that is high in saturated fat, such as fatty meat. Desserts and other sweets, sugary drinks, and other foods with added sugar. Full-fat dairy products. Do not salt foods before eating. Do not eat more than 4 egg yolks a week. Try to eat at least 2 vegetarian meals a week. Eat more home-cooked food and less restaurant,   buffet, and fast food. Lifestyle When eating at a restaurant, ask that your food be prepared with less salt or no salt, if possible. If you drink alcohol: Limit how much you use to: 0-1 drink a day for women who are not pregnant. 0-2 drinks a day for men. Be aware of how much alcohol is in your drink. In the [place] one drink equals one 12 oz bottle of beer (355 mL), one 5 oz glass of wine (148 mL), or one 1 oz  glass of hard liquor (44 mL). General information Avoid eating more than 2,300 mg of salt a day. If you have hypertension, you may need to reduce your sodium intake to 1,500 mg a day. Work with your health care provider to maintain a healthy body weight or to lose weight. Ask what an ideal weight is for you. Get at least 30 minutes of exercise that causes your heart to beat faster (aerobic exercise) most days of the week. Activities may include walking, swimming, or biking. Work with your health care provider or dietitian to adjust your eating plan to your individual calorie needs. What foods should I eat? Fruits All fresh, dried, or frozen fruit. Canned fruit in natural juice (without added sugar). Vegetables Fresh or frozen vegetables (raw, steamed, roasted, or grilled). Low-sodium or reduced-sodium tomato and vegetable juice. Low-sodium or reduced-sodium tomato sauce and tomato paste. Low-sodium or reduced-sodium canned vegetables. Grains Whole-grain or whole-wheat bread. Whole-grain or whole-wheat pasta. Brown rice. Oatmeal. Quinoa. Bulgur. Whole-grain and low-sodium cereals. Pita bread. Low-fat, low-sodium crackers. Whole-wheat flour tortillas. Meats and other proteins Skinless chicken or turkey. Ground chicken or turkey. Pork with fat trimmed off. Fish and seafood. Egg whites. Dried beans, peas, or lentils. Unsalted nuts, nut butters, and seeds. Unsalted canned beans. Lean cuts of beef with fat trimmed off. Low-sodium, lean precooked or cured meat, such as sausages or meat loaves. Dairy Low-fat (1%) or fat-free (skim) milk. Reduced-fat, low-fat, or fat-free cheeses. Nonfat, low-sodium ricotta or cottage cheese. Low-fat or nonfat yogurt. Low-fat, low-sodium cheese. Fats and oils Soft margarine without trans fats. Vegetable oil. Reduced-fat, low-fat, or light mayonnaise and salad dressings (reduced-sodium). Canola, safflower, olive, avocado, soybean, and sunflower oils. Avocado. Seasonings and  condiments Herbs. Spices. Seasoning mixes without salt. Other foods Unsalted popcorn and pretzels. Fat-free sweets. The items listed above may not be a complete list of foods and beverages you can eat. Contact a dietitian for more information. What foods should I avoid? Fruits Canned fruit in a light or heavy syrup. Fried fruit. Fruit in cream or butter sauce. Vegetables Creamed or fried vegetables. Vegetables in a cheese sauce. Regular canned vegetables (not low-sodium or reduced-sodium). Regular canned tomato sauce and paste (not low-sodium or reduced-sodium). Regular tomato and vegetable juice (not low-sodium or reduced-sodium). Pickles. Olives. Grains Baked goods made with fat, such as croissants, muffins, or some breads. Dry pasta or rice meal packs. Meats and other proteins Fatty cuts of meat. Ribs. Fried meat. Bacon. Bologna, salami, and other precooked or cured meats, such as sausages or meat loaves. Fat from the back of a pig (fatback). Bratwurst. Salted nuts and seeds. Canned beans with added salt. Canned or smoked fish. Whole eggs or egg yolks. Chicken or turkey with skin. Dairy Whole or 2% milk, cream, and half-and-half. Whole or full-fat cream cheese. Whole-fat or sweetened yogurt. Full-fat cheese. Nondairy creamers. Whipped toppings. Processed cheese and cheese spreads. Fats and oils Butter. Stick margarine. Lard. Shortening. Ghee. Bacon fat. Tropical oils, such as coconut, palm   kernel, or palm oil. Seasonings and condiments Onion salt, garlic salt, seasoned salt, table salt, and sea salt. Worcestershire sauce. Tartar sauce. Barbecue sauce. Teriyaki sauce. Soy sauce, including reduced-sodium. Steak sauce. Canned and packaged gravies. Fish sauce. Oyster sauce. Cocktail sauce. Store-bought horseradish. Ketchup. Mustard. Meat flavorings and tenderizers. Bouillon cubes. Hot sauces. Pre-made or packaged marinades. Pre-made or packaged taco seasonings. Relishes. Regular salad  dressings. Other foods Salted popcorn and pretzels. The items listed above may not be a complete list of foods and beverages you should avoid. Contact a dietitian for more information. Where to find more information National Heart, Lung, and Blood Institute: www.nhlbi.nih.gov American Heart Association: www.heart.org Academy of Nutrition and Dietetics: www.eatright.org National Kidney Foundation: www.kidney.org Summary The DASH eating plan is a healthy eating plan that has been shown to reduce high blood pressure (hypertension). It may also reduce your risk for type 2 diabetes, heart disease, and stroke. When on the DASH eating plan, aim to eat more fresh fruits and vegetables, whole grains, lean proteins, low-fat dairy, and heart-healthy fats. With the DASH eating plan, you should limit salt (sodium) intake to 2,300 mg a day. If you have hypertension, you may need to reduce your sodium intake to 1,500 mg a day. Work with your health care provider or dietitian to adjust your eating plan to your individual calorie needs. This information is not intended to replace advice given to you by your health care provider. Make sure you discuss any questions you have with your health care provider. Document Revised: 01/06/2019 Document Reviewed: 01/06/2019 Elsevier Patient Education  2023 Elsevier Inc.  

## 2022-06-03 ENCOUNTER — Encounter: Payer: Self-pay | Admitting: Nurse Practitioner

## 2022-06-03 ENCOUNTER — Ambulatory Visit (INDEPENDENT_AMBULATORY_CARE_PROVIDER_SITE_OTHER): Payer: 59 | Admitting: Nurse Practitioner

## 2022-06-03 DIAGNOSIS — I10 Essential (primary) hypertension: Secondary | ICD-10-CM | POA: Diagnosis not present

## 2022-06-03 DIAGNOSIS — J4540 Moderate persistent asthma, uncomplicated: Secondary | ICD-10-CM

## 2022-06-03 DIAGNOSIS — R011 Cardiac murmur, unspecified: Secondary | ICD-10-CM | POA: Diagnosis not present

## 2022-06-03 DIAGNOSIS — M25562 Pain in left knee: Secondary | ICD-10-CM | POA: Diagnosis not present

## 2022-06-03 DIAGNOSIS — Z8673 Personal history of transient ischemic attack (TIA), and cerebral infarction without residual deficits: Secondary | ICD-10-CM | POA: Diagnosis not present

## 2022-06-03 DIAGNOSIS — R7301 Impaired fasting glucose: Secondary | ICD-10-CM | POA: Diagnosis not present

## 2022-06-03 DIAGNOSIS — N393 Stress incontinence (female) (male): Secondary | ICD-10-CM

## 2022-06-03 DIAGNOSIS — G4733 Obstructive sleep apnea (adult) (pediatric): Secondary | ICD-10-CM | POA: Diagnosis not present

## 2022-06-03 DIAGNOSIS — M1712 Unilateral primary osteoarthritis, left knee: Secondary | ICD-10-CM | POA: Diagnosis not present

## 2022-06-03 DIAGNOSIS — G8929 Other chronic pain: Secondary | ICD-10-CM | POA: Diagnosis not present

## 2022-06-03 DIAGNOSIS — E782 Mixed hyperlipidemia: Secondary | ICD-10-CM | POA: Diagnosis not present

## 2022-06-03 DIAGNOSIS — I700 Atherosclerosis of aorta: Secondary | ICD-10-CM | POA: Diagnosis not present

## 2022-06-03 MED ORDER — FLUTICASONE PROPIONATE 50 MCG/ACT NA SUSP: NASAL | 6 refills | Status: DC

## 2022-06-03 NOTE — Assessment & Plan Note (Signed)
Chronic.  Noted on imaging 10/30/20.  Continue statin daily and ASA for prevention. 

## 2022-06-03 NOTE — Assessment & Plan Note (Signed)
Chronic, stable.  Continue current medication regimen and adjust as needed.  Recent LDL at goal, well below neuro goal <70.  Lipid panel today. 

## 2022-06-03 NOTE — Assessment & Plan Note (Signed)
Chronic, ongoing.  Continue current medication regimen and adjust as needed.  Consider spirometry next visit.   

## 2022-06-03 NOTE — Assessment & Plan Note (Signed)
Chronic, stable.  Continue daily use of pads as needed, consider PT for pelvic floor if worsening.  Recommend focus on Kegel exercises and pelvic floor strengthening. 

## 2022-06-03 NOTE — Assessment & Plan Note (Signed)
Chronic, stable.  BP at goal on recheck today and at goal on home readings.  Continue current medication regimen and adjust as needed.  Continue daily BP checks at home and focus on DASH diet regimen.  Discussed strict goal BP <130/80 due to history of stroke.  Labs today: CMP.  Return in 6 months.

## 2022-06-03 NOTE — Assessment & Plan Note (Signed)
Recent A1c 5.8%.  Check A1c today and treat as needed.

## 2022-06-03 NOTE — Assessment & Plan Note (Signed)
Improved right upper extremity and right lower extremity strength and function with OT on board.   Praised for dedication to OT and progress.  Continue focus on prevention with BP <130/90, A1c <7%, and LDL <70.  Lipid panel today. 

## 2022-06-03 NOTE — Assessment & Plan Note (Signed)
Chronic, stable.  Recommend use of CPAP 100% of the time, has benefit from this. 

## 2022-06-03 NOTE — Assessment & Plan Note (Signed)
Systolic 2/6.  No symptoms.  Consider echo in future. 

## 2022-06-03 NOTE — Assessment & Plan Note (Signed)
BMI 42.72 with HTN/HLD, history of CVA.  Recommended eating smaller high protein, low fat meals more frequently and exercising 30 mins a day 5 times a week with a goal of 10-15lb weight loss in the next 3 months. Patient voiced their understanding and motivation to adhere to these recommendations.  

## 2022-06-03 NOTE — Progress Notes (Signed)
t  BP 130/74 (BP Location: Left Arm, Patient Position: Sitting, Cuff Size: Large) Comment: Patient refused second BP  Pulse 70   Temp 97.8 F (36.6 C) (Oral)   Ht 5' 2.99" (1.6 m)   Wt 241 lb 1.6 oz (109.4 kg)   SpO2 99%   BMI 42.72 kg/m    Subjective:    Patient ID: Kristin Solis, female    DOB: 01-22-1956, 67 y.o.   MRN: [id number]  HPI: Kristin Solis is a 67 y.o. female  Chief Complaint  Patient presents with   Hypertension   Hyperlipidemia   Sleep Apnea   Asthma   IFG   Having left knee pain today, saw Dr. Ernest Pine with ortho on 04/07/22 and received injection which worked well until Easter.  Is going to see ortho today for follow-up.  Have not done physical therapy as of yet.  Imaging done at ortho noted moderate arthritis.  Taking Tylenol at home.  HYPERTENSION / HYPERLIPIDEMIA Taking Lisinopril 5 MG, ASA, and Atorvastatin 40 MG daily.  Using CPAP nightly, 100%, uses nose piece only.  Has history of CVA March 2020 and is followed by neurology, Dr. Pearlean Brownie, saw in October 2021.  Had OT in past which improved ROM.  Aortic atherosclerosis noted on imaging 10/30/20.   History of elevated glucose on labs, recent A1c 5.8% October.  Satisfied with current treatment? yes Duration of hypertension: chronic BP monitoring frequency: daily BP range: this morning 130/68 -- often 130/70 range at home BP medication side effects: no Duration of hyperlipidemia: chronic Cholesterol medication side effects: no Cholesterol supplements: none Medication compliance: good compliance Aspirin: yes Recent stressors: no Recurrent headaches: no Visual changes: no Palpitations: no Dyspnea: no Chest pain: no Lower extremity edema: no Dizzy/lightheaded: no    ASTHMA Uses Advair every day, then Albuterol as needed.   Asthma status: stable Satisfied with current treatment?: yes Albuterol/rescue inhaler frequency: not often, has not used in months Dyspnea frequency: none Wheezing frequency:  none Cough frequency: none Nocturnal symptom frequency: none Limitation of activity: no Current upper respiratory symptoms: no Triggers: seasonal Home peak flows:none Last Spirometry: unknown Failed/intolerant to following asthma meds: none Asthma meds in past: as above Aerochamber/spacer use: no Visits to ER or Urgent Care in past year: no Pneumovax: Up To Date Influenza: Up to Date   STRESS INCONTINENCE Sees urology, last saw 08/18/21 for history of kidney stones.  Had cystoscopy 08/12/21.  Continues to have occasional dribbling and incontinence episodes -- uses briefs at home.     Dysuria: no Urinary frequency: no Urgency: no Small volume voids: no Symptom severity: no Urinary incontinence: occasional Foul odor: no Hematuria: no Abdominal pain: no Back pain: no Suprapubic pain/pressure: no Flank pain: no  Relevant past medical, surgical, family and social history reviewed and updated as indicated. Interim medical history since our last visit reviewed. Allergies and medications reviewed and updated.  Review of Systems  Constitutional:  Negative for activity change, appetite change, diaphoresis, fatigue and fever.  Respiratory:  Negative for cough, chest tightness and shortness of breath.   Cardiovascular:  Negative for chest pain, palpitations and leg swelling.  Gastrointestinal: Negative.   Neurological:  Negative for dizziness, syncope, speech difficulty, weakness, light-headedness, numbness and headaches.  Psychiatric/Behavioral: Negative.      Per HPI unless specifically indicated above     Objective:    BP 130/74 (BP Location: Left Arm, Patient Position: Sitting, Cuff Size: Large) Comment: Patient refused second BP  Pulse 70  Temp 97.8 F (36.6 C) (Oral)   Ht 5' 2.99" (1.6 m)   Wt 241 lb 1.6 oz (109.4 kg)   SpO2 99%   BMI 42.72 kg/m   Wt Readings from Last 3 Encounters:  06/03/22 241 lb 1.6 oz (109.4 kg)  03/25/22 241 lb 8 oz (109.5 kg)  03/09/22 241 lb  1.6 oz (109.4 kg)    Physical Exam Vitals and nursing note reviewed.  Constitutional:      General: She is awake. She is not in acute distress.    Appearance: She is well-developed. She is obese. She is not ill-appearing.  HENT:     Head: Normocephalic.     Right Ear: Hearing, ear canal and external ear normal. No drainage.     Left Ear: Hearing, ear canal and external ear normal. No drainage.  Eyes:     General: Lids are normal.        Right eye: No discharge.        Left eye: No discharge.     Conjunctiva/sclera: Conjunctivae normal.     Pupils: Pupils are equal, round, and reactive to light.  Neck:     Thyroid: No thyromegaly.     Vascular: No carotid bruit.  Cardiovascular:     Rate and Rhythm: Normal rate and regular rhythm.     Heart sounds: Murmur heard.     Systolic murmur is present with a grade of 2/6.     No gallop.  Pulmonary:     Effort: Pulmonary effort is normal. No accessory muscle usage or respiratory distress.     Breath sounds: Normal breath sounds.  Abdominal:     General: Bowel sounds are normal.     Palpations: Abdomen is soft.  Musculoskeletal:     Right hand: Normal.     Left hand: Normal.     Cervical back: Normal range of motion and neck supple.     Right lower leg: No edema.     Left lower leg: No edema.  Skin:    General: Skin is warm and dry.  Neurological:     Mental Status: She is alert and oriented to person, place, and time.  Psychiatric:        Attention and Perception: Attention normal.        Mood and Affect: Mood normal.        Speech: Speech normal.        Behavior: Behavior normal. Behavior is cooperative.        Thought Content: Thought content normal.    Results for orders placed or performed in visit on 12/02/21  CBC with Differential/Platelet  Result Value Ref Range   WBC 6.1 3.4 - 10.8 x10E3/uL   RBC 4.70 3.77 - 5.28 x10E6/uL   Hemoglobin 13.9 11.1 - 15.9 g/dL   Hematocrit 40.9 81.1 - 46.6 %   MCV 89 79 - 97 fL   MCH  29.6 26.6 - 33.0 pg   MCHC 33.3 31.5 - 35.7 g/dL   RDW 91.4 78.2 - 95.6 %   Platelets 252 150 - 450 x10E3/uL   Neutrophils 65 Not Estab. %   Lymphs 23 Not Estab. %   Monocytes 9 Not Estab. %   Eos 2 Not Estab. %   Basos 1 Not Estab. %   Neutrophils Absolute 4.0 1.4 - 7.0 x10E3/uL   Lymphocytes Absolute 1.4 0.7 - 3.1 x10E3/uL   Monocytes Absolute 0.6 0.1 - 0.9 x10E3/uL   EOS (ABSOLUTE) 0.1 0.0 -  0.4 x10E3/uL   Basophils Absolute 0.0 0.0 - 0.2 x10E3/uL   Immature Granulocytes 0 Not Estab. %   Immature Grans (Abs) 0.0 0.0 - 0.1 x10E3/uL  Comprehensive metabolic panel  Result Value Ref Range   Glucose 88 70 - 99 mg/dL   BUN 18 8 - 27 mg/dL   Creatinine, Ser 1.61 0.57 - 1.00 mg/dL   eGFR 66 >09 UE/AVW/0.98   BUN/Creatinine Ratio 19 12 - 28   Sodium 142 134 - 144 mmol/L   Potassium 4.2 3.5 - 5.2 mmol/L   Chloride 106 96 - 106 mmol/L   CO2 23 20 - 29 mmol/L   Calcium 9.6 8.7 - 10.3 mg/dL   Total Protein 6.5 6.0 - 8.5 g/dL   Albumin 4.0 3.9 - 4.9 g/dL   Globulin, Total 2.5 1.5 - 4.5 g/dL   Albumin/Globulin Ratio 1.6 1.2 - 2.2   Bilirubin Total 0.3 0.0 - 1.2 mg/dL   Alkaline Phosphatase 134 (H) 44 - 121 IU/L   AST 21 0 - 40 IU/L   ALT 23 0 - 32 IU/L  Lipid Panel w/o Chol/HDL Ratio  Result Value Ref Range   Cholesterol, Total 142 100 - 199 mg/dL   Triglycerides 51 0 - 149 mg/dL   HDL 73 >11 mg/dL   VLDL Cholesterol Cal 11 5 - 40 mg/dL   LDL Chol Calc (NIH) 58 0 - 99 mg/dL  TSH  Result Value Ref Range   TSH 1.860 0.450 - 4.500 uIU/mL  VITAMIN D 25 Hydroxy (Vit-D Deficiency, Fractures)  Result Value Ref Range   Vit D, 25-Hydroxy 33.8 30.0 - 100.0 ng/mL  HgB A1c  Result Value Ref Range   Hgb A1c MFr Bld 5.8 (H) 4.8 - 5.6 %   Est. average glucose Bld gHb Est-mCnc 120 mg/dL      Assessment & Plan:   Problem List Items Addressed This Visit       Cardiovascular and Mediastinum   Aortic atherosclerosis    Chronic.  Noted on imaging 10/30/20.  Continue statin daily and ASA  for prevention.      Essential hypertension, benign    Chronic, stable.  BP at goal on recheck today and at goal on home readings.  Continue current medication regimen and adjust as needed.  Continue daily BP checks at home and focus on DASH diet regimen.  Discussed strict goal BP <130/80 due to history of stroke.  Labs today: CMP.  Return in 6 months.        Respiratory   Asthma    Chronic, ongoing.  Continue current medication regimen and adjust as needed.  Consider spirometry next visit.        Obstructive sleep apnea    Chronic, stable.  Recommend use of CPAP 100% of the time, has benefit from this.        Endocrine   IFG (impaired fasting glucose)    Recent A1c 5.8%.  Check A1c today and treat as needed.      Relevant Orders   HgB A1c     Other   Heart murmur    Systolic 2/6.  No symptoms.  Consider echo in future.      History of stroke    Improved right upper extremity and right lower extremity strength and function with OT on board.   Praised for dedication to OT and progress.  Continue focus on prevention with BP <130/90, A1c <7%, and LDL <70.  Lipid panel today.  Mixed hyperlipidemia    Chronic, stable.  Continue current medication regimen and adjust as needed.  Recent LDL at goal, well below neuro goal <70.  Lipid panel today.      Relevant Orders   Comprehensive metabolic panel   Lipid Panel w/o Chol/HDL Ratio   Morbid obesity - Primary    BMI 42.72 with HTN/HLD, history of CVA.  Recommended eating smaller high protein, low fat meals more frequently and exercising 30 mins a day 5 times a week with a goal of 10-15lb weight loss in the next 3 months. Patient voiced their understanding and motivation to adhere to these recommendations.       Stress incontinence    Chronic, stable.  Continue daily use of pads as needed, consider PT for pelvic floor if worsening.  Recommend focus on Kegel exercises and pelvic floor strengthening.        Follow up  plan: Return in about 6 months (around 12/03/2022) for Annual physical -- after 12/03/22.

## 2022-06-04 LAB — COMPREHENSIVE METABOLIC PANEL
ALT: 25 IU/L (ref 0–32)
AST: 21 IU/L (ref 0–40)
Albumin/Globulin Ratio: 1.7 (ref 1.2–2.2)
Albumin: 4 g/dL (ref 3.9–4.9)
Alkaline Phosphatase: 114 IU/L (ref 44–121)
BUN/Creatinine Ratio: 22 (ref 12–28)
BUN: 20 mg/dL (ref 8–27)
Bilirubin Total: 0.4 mg/dL (ref 0.0–1.2)
CO2: 24 mmol/L (ref 20–29)
Calcium: 9.8 mg/dL (ref 8.7–10.3)
Chloride: 107 mmol/L — ABNORMAL HIGH (ref 96–106)
Creatinine, Ser: 0.93 mg/dL (ref 0.57–1.00)
Globulin, Total: 2.4 g/dL (ref 1.5–4.5)
Glucose: 104 mg/dL — ABNORMAL HIGH (ref 70–99)
Potassium: 4.3 mmol/L (ref 3.5–5.2)
Sodium: 143 mmol/L (ref 134–144)
Total Protein: 6.4 g/dL (ref 6.0–8.5)
eGFR: 68 mL/min/{1.73_m2} (ref >59)

## 2022-06-04 LAB — LIPID PANEL W/O CHOL/HDL RATIO
Cholesterol, Total: 131 mg/dL (ref 100–199)
HDL: 68 mg/dL (ref >39)
LDL Chol Calc (NIH): 49 mg/dL (ref 0–99)
Triglycerides: 65 mg/dL (ref 0–149)
VLDL Cholesterol Cal: 14 mg/dL (ref 5–40)

## 2022-06-04 LAB — HEMOGLOBIN A1C
Est. average glucose Bld gHb Est-mCnc: 120 mg/dL
Hgb A1c MFr Bld: 5.8 % — ABNORMAL HIGH (ref 4.8–5.6)

## 2022-06-04 NOTE — Progress Notes (Signed)
Contacted via MyChart   Good morning Kristin Solis, your labs have returned: - Kidney function, creatinine and eGFR, remains normal, as is liver function, AST and ALT.  - A1c remains in prediabetic range at 5.8% -- no changes.  Continue diet and exercise focus. - Cholesterol labs are at goal.  Any questions? Keep being amazing!!  Thank you for allowing me to participate in your care.  I appreciate you. Kindest regards, Jaidence Geisler

## 2022-06-15 DIAGNOSIS — G4733 Obstructive sleep apnea (adult) (pediatric): Secondary | ICD-10-CM | POA: Diagnosis not present

## 2022-06-17 DIAGNOSIS — M25562 Pain in left knee: Secondary | ICD-10-CM | POA: Diagnosis not present

## 2022-06-17 DIAGNOSIS — M1712 Unilateral primary osteoarthritis, left knee: Secondary | ICD-10-CM | POA: Diagnosis not present

## 2022-09-02 DIAGNOSIS — H5203 Hypermetropia, bilateral: Secondary | ICD-10-CM | POA: Diagnosis not present

## 2022-09-02 DIAGNOSIS — H04123 Dry eye syndrome of bilateral lacrimal glands: Secondary | ICD-10-CM | POA: Diagnosis not present

## 2022-09-02 DIAGNOSIS — H2513 Age-related nuclear cataract, bilateral: Secondary | ICD-10-CM | POA: Diagnosis not present

## 2022-09-02 DIAGNOSIS — H40013 Open angle with borderline findings, low risk, bilateral: Secondary | ICD-10-CM | POA: Diagnosis not present

## 2022-09-16 ENCOUNTER — Other Ambulatory Visit: Payer: Self-pay | Admitting: Nurse Practitioner

## 2022-09-16 DIAGNOSIS — Z1231 Encounter for screening mammogram for malignant neoplasm of breast: Secondary | ICD-10-CM

## 2022-09-18 ENCOUNTER — Telehealth: Payer: Self-pay | Admitting: Nurse Practitioner

## 2022-09-18 NOTE — Telephone Encounter (Addendum)
Patient called in and stated she has a CPAP machine and she received a message yesterday, 09/17/2022 from [employer], that it is time for her to order her CPAP supplies again. Per patient, she stated [facility] sent her a message that they need an updated prescription from [facility] in order to ship CPAP supplies. Please advise.  [employer] fax #: [phone]  Patients callback #[phone]

## 2022-09-21 DIAGNOSIS — G4701 Insomnia due to medical condition: Secondary | ICD-10-CM | POA: Diagnosis not present

## 2022-09-21 DIAGNOSIS — G4733 Obstructive sleep apnea (adult) (pediatric): Secondary | ICD-10-CM | POA: Diagnosis not present

## 2022-09-21 NOTE — Telephone Encounter (Signed)
Reviewed patient's chart. New RX for CPAP supplies was signed and faxed in June 2024 to [facility].   Called [facility]. They state that they did have this on file and that the patient's order has been sent to the warehouse to be filled. Supplies will be shipped to the patient soon.   Called and LVM asking for patient to please return my call to notify her of the above information.

## 2022-09-22 NOTE — Telephone Encounter (Signed)
Patient returned call and was notified about her CPAP supplies.

## 2022-10-15 ENCOUNTER — Ambulatory Visit
Admission: RE | Admit: 2022-10-15 | Discharge: 2022-10-15 | Disposition: A | Discharge status: Home / Self Care | Payer: 59 | Source: Ambulatory Visit | Attending: Nurse Practitioner | Admitting: Nurse Practitioner

## 2022-10-15 DIAGNOSIS — Z1231 Encounter for screening mammogram for malignant neoplasm of breast: Secondary | ICD-10-CM | POA: Insufficient documentation

## 2022-10-16 NOTE — Progress Notes (Signed)
Contacted via MyChart   Normal mammogram, may repeat in one year:)

## 2022-11-29 NOTE — Patient Instructions (Signed)
Be Involved in Caring For Your Health:  Taking Medications When medications are taken as directed, they can greatly improve your health. But if they are not taken as prescribed, they may not work. In some cases, not taking them correctly can be harmful. To help ensure your treatment remains effective and safe, understand your medications and how to take them. Bring your medications to each visit for review by your provider.  Your lab results, notes, and after visit summary will be available on My Chart. We strongly encourage you to use this feature. If lab results are abnormal the clinic will contact you with the appropriate steps. If the clinic does not contact you assume the results are satisfactory. You can always view your results on My Chart. If you have questions regarding your health or results, please contact the clinic during office hours. You can also ask questions on My Chart.  We at [facility] are grateful that you chose [place] to provide your care. We strive to provide evidence-based and compassionate care and are always looking for feedback. If you get a survey from the clinic please complete this so we can hear your opinions.  DASH Eating Plan DASH stands for Dietary Approaches to Stop Hypertension. The DASH eating plan is a healthy eating plan that has been shown to: Lower high blood pressure (hypertension). Reduce your risk for type 2 diabetes, heart disease, and stroke. Help with weight loss. What are tips for following this plan? Reading food labels Check food labels for the amount of salt (sodium) per serving. Choose foods with less than 5 percent of the Daily Value (DV) of sodium. In general, foods with less than 300 milligrams (mg) of sodium per serving fit into this eating plan. To find whole grains, look for the word "whole" as the first word in the ingredient list. Shopping Buy products labeled as "low-sodium" or "no salt added." Buy fresh foods. Avoid canned  foods and pre-made or frozen meals. Cooking Try not to add salt when you cook. Use salt-free seasonings or herbs instead of table salt or sea salt. Check with your health care provider or pharmacist before using salt substitutes. Do not fry foods. Cook foods in healthy ways, such as baking, boiling, grilling, roasting, or broiling. Cook using oils that are good for your heart. These include olive, canola, avocado, soybean, and sunflower oil. Meal planning  Eat a balanced diet. This should include: 4 or more servings of fruits and 4 or more servings of vegetables each day. Try to fill half of your plate with fruits and vegetables. 6-8 servings of whole grains each day. 6 or less servings of lean meat, poultry, or fish each day. 1 oz is 1 serving. A 3 oz (85 g) serving of meat is about the same size as the palm of your hand. One egg is 1 oz (28 g). 2-3 servings of low-fat dairy each day. One serving is 1 cup (237 mL). 1 serving of nuts, seeds, or beans 5 times each week. 2-3 servings of heart-healthy fats. Healthy fats called omega-3 fatty acids are found in foods such as walnuts, flaxseeds, fortified milks, and eggs. These fats are also found in cold-water fish, such as sardines, salmon, and mackerel. Limit how much you eat of: Canned or prepackaged foods. Food that is high in trans fat, such as fried foods. Food that is high in saturated fat, such as fatty meat. Desserts and other sweets, sugary drinks, and other foods with added sugar. Full-fat  dairy products. Do not salt foods before eating. Do not eat more than 4 egg yolks a week. Try to eat at least 2 vegetarian meals a week. Eat more home-cooked food and less restaurant, buffet, and fast food. Lifestyle When eating at a restaurant, ask if your food can be made with less salt or no salt. If you drink alcohol: Limit how much you have to: 0-1 drink a day if you are female. 0-2 drinks a day if you are female. Know how much alcohol is in  your drink. In the [place] one drink is one 12 oz bottle of beer (355 mL), one 5 oz glass of wine (148 mL), or one 1 oz glass of hard liquor (44 mL). General information Avoid eating more than 2,300 mg of salt a day. If you have hypertension, you may need to reduce your sodium intake to 1,500 mg a day. Work with your provider to stay at a healthy body weight or lose weight. Ask what the best weight range is for you. On most days of the week, get at least 30 minutes of exercise that causes your heart to beat faster. This may include walking, swimming, or biking. Work with your provider or dietitian to adjust your eating plan to meet your specific calorie needs. What foods should I eat? Fruits All fresh, dried, or frozen fruit. Canned fruits that are in their natural juice and do not have sugar added to them. Vegetables Fresh or frozen vegetables that are raw, steamed, roasted, or grilled. Low-sodium or reduced-sodium tomato and vegetable juice. Low-sodium or reduced-sodium tomato sauce and tomato paste. Low-sodium or reduced-sodium canned vegetables. Grains Whole-grain or whole-wheat bread. Whole-grain or whole-wheat pasta. Brown rice. Orpah Cobb. Bulgur. Whole-grain and low-sodium cereals. Pita bread. Low-fat, low-sodium crackers. Whole-wheat flour tortillas. Meats and other proteins Skinless chicken or [place]. Ground chicken or [place]. Pork with fat trimmed off. Fish and seafood. Egg whites. Dried beans, peas, or lentils. Unsalted nuts, nut butters, and seeds. Unsalted canned beans. Lean cuts of beef with fat trimmed off. Low-sodium, lean precooked or cured meat, such as sausages or meat loaves. Dairy Low-fat (1%) or fat-free (skim) milk. Reduced-fat, low-fat, or fat-free cheeses. Nonfat, low-sodium ricotta or cottage cheese. Low-fat or nonfat yogurt. Low-fat, low-sodium cheese. Fats and oils Soft margarine without trans fats. Vegetable oil. Reduced-fat, low-fat, or light mayonnaise and salad  dressings (reduced-sodium). Canola, safflower, olive, avocado, soybean, and sunflower oils. Avocado. Seasonings and condiments Herbs. Spices. Seasoning mixes without salt. Other foods Unsalted popcorn and pretzels. Fat-free sweets. The items listed above may not be all the foods and drinks you can have. Talk to a dietitian to learn more. What foods should I avoid? Fruits Canned fruit in a light or heavy syrup. Fried fruit. Fruit in cream or butter sauce. Vegetables Creamed or fried vegetables. Vegetables in a cheese sauce. Regular canned vegetables that are not marked as low-sodium or reduced-sodium. Regular canned tomato sauce and paste that are not marked as low-sodium or reduced-sodium. Regular tomato and vegetable juices that are not marked as low-sodium or reduced-sodium. Rosita Fire. Olives. Grains Baked goods made with fat, such as croissants, muffins, or some breads. Dry pasta or rice meal packs. Meats and other proteins Fatty cuts of meat. Ribs. Fried meat. Tomasa Blase. Bologna, salami, and other precooked or cured meats, such as sausages or meat loaves, that are not lean and low in sodium. Fat from the back of a pig (fatback). Bratwurst. Salted nuts and seeds. Canned beans with added salt. Canned  or smoked fish. Whole eggs or egg yolks. Chicken or [place] with skin. Dairy Whole or 2% milk, cream, and half-and-half. Whole or full-fat cream cheese. Whole-fat or sweetened yogurt. Full-fat cheese. Nondairy creamers. Whipped toppings. Processed cheese and cheese spreads. Fats and oils Butter. Stick margarine. Lard. Shortening. Ghee. Bacon fat. Tropical oils, such as coconut, palm kernel, or palm oil. Seasonings and condiments Onion salt, garlic salt, seasoned salt, table salt, and sea salt. Worcestershire sauce. Tartar sauce. Barbecue sauce. Teriyaki sauce. Soy sauce, including reduced-sodium soy sauce. Steak sauce. Canned and packaged gravies. Fish sauce. Oyster sauce. Cocktail sauce. Store-bought  horseradish. Ketchup. Mustard. Meat flavorings and tenderizers. Bouillon cubes. Hot sauces. Pre-made or packaged marinades. Pre-made or packaged taco seasonings. Relishes. Regular salad dressings. Other foods Salted popcorn and pretzels. The items listed above may not be all the foods and drinks you should avoid. Talk to a dietitian to learn more. Where to find more information National Heart, Lung, and Blood Institute (NHLBI): BuffaloDryCleaner.gl American Heart Association (AHA): heart.org Academy of Nutrition and Dietetics: eatright.org National Kidney Foundation (NKF): kidney.org This information is not intended to replace advice given to you by your health care provider. Make sure you discuss any questions you have with your health care provider. Document Revised: 02/19/2022 Document Reviewed: 02/19/2022 Elsevier Patient Education  2024 [employer].

## 2022-11-30 ENCOUNTER — Other Ambulatory Visit: Payer: Self-pay | Admitting: Nurse Practitioner

## 2022-11-30 NOTE — Telephone Encounter (Unsigned)
Copied from CRM [id number]. Topic: General - Other >> Nov 30, 2022  8:12 AM Everette C wrote: Reason for CRM: Medication Refill - Medication: fluticasone (FLONASE) 50 MCG/ACT nasal spray [[id number]]  Has the patient contacted their pharmacy? Yes.   (Agent: If no, request that the patient contact the pharmacy for the refill. If patient does not wish to contact the pharmacy document the reason why and proceed with request.) (Agent: If yes, when and what did the pharmacy advise?)  Preferred Pharmacy (with phone number or street name): [facility] [address]), [place] - [address]) [place] [address] Phone: [phone] Fax: [phone] Hours: Not open 24 hours   Has the patient been seen for an appointment in the last year OR does the patient have an upcoming appointment? Yes.    Agent: Please be advised that RX refills may take up to 3 business days. We ask that you follow-up with your pharmacy.

## 2022-12-01 MED ORDER — FLUTICASONE PROPIONATE 50 MCG/ACT NA SUSP: NASAL | 3 refills | Status: DC

## 2022-12-01 NOTE — Telephone Encounter (Signed)
Requested Prescriptions  Pending Prescriptions Disp Refills   fluticasone (FLONASE) 50 MCG/ACT nasal spray 16 g 3    Sig: Use 2 spray(s) in each nostril once daily     Ear, Nose, and Throat: Nasal Preparations - Corticosteroids Passed - 11/30/2022  8:32 AM      Passed - Valid encounter within last 12 months    Recent Outpatient Visits           6 months ago Morbid obesity (HCC)   [place] [facility] [place], [place] T, NP   8 months ago Left leg pain   [place] [facility], [place] T, NP   8 months ago Morbid obesity (HCC)   [place] [facility] [place], Corrie Dandy T, NP   12 months ago Morbid obesity (HCC)   [place] [facility] [place], Corrie Dandy T, NP   1 year ago Tick bite of left shoulder, initial encounter   [place] [facility] [place], Oswaldo Conroy, PA-C       Future Appointments             In 2 days [name], NP [place] [facility]

## 2022-12-03 ENCOUNTER — Encounter: Payer: Self-pay | Admitting: Nurse Practitioner

## 2022-12-03 ENCOUNTER — Ambulatory Visit: Payer: 59 | Admitting: Nurse Practitioner

## 2022-12-03 VITALS — BP 114/76 | HR 63 | Temp 97.8°F | Ht 62.1 in | Wt 238.2 lb

## 2022-12-03 DIAGNOSIS — Z23 Encounter for immunization: Secondary | ICD-10-CM

## 2022-12-03 DIAGNOSIS — Z8673 Personal history of transient ischemic attack (TIA), and cerebral infarction without residual deficits: Secondary | ICD-10-CM

## 2022-12-03 DIAGNOSIS — J4540 Moderate persistent asthma, uncomplicated: Secondary | ICD-10-CM

## 2022-12-03 DIAGNOSIS — Z0000 Encounter for general adult medical examination without abnormal findings: Secondary | ICD-10-CM

## 2022-12-03 DIAGNOSIS — I10 Essential (primary) hypertension: Secondary | ICD-10-CM

## 2022-12-03 DIAGNOSIS — G4733 Obstructive sleep apnea (adult) (pediatric): Secondary | ICD-10-CM

## 2022-12-03 DIAGNOSIS — N393 Stress incontinence (female) (male): Secondary | ICD-10-CM

## 2022-12-03 DIAGNOSIS — E782 Mixed hyperlipidemia: Secondary | ICD-10-CM

## 2022-12-03 DIAGNOSIS — J45909 Unspecified asthma, uncomplicated: Secondary | ICD-10-CM

## 2022-12-03 DIAGNOSIS — Z6841 Body Mass Index (BMI) 40.0 and over, adult: Secondary | ICD-10-CM

## 2022-12-03 DIAGNOSIS — I700 Atherosclerosis of aorta: Secondary | ICD-10-CM

## 2022-12-03 DIAGNOSIS — R7301 Impaired fasting glucose: Secondary | ICD-10-CM

## 2022-12-03 DIAGNOSIS — M85851 Other specified disorders of bone density and structure, right thigh: Secondary | ICD-10-CM | POA: Diagnosis not present

## 2022-12-03 MED ORDER — ALBUTEROL SULFATE HFA 108 (90 BASE) MCG/ACT IN AERS: 2.0000 | INHALATION_SPRAY | Freq: Four times a day (QID) | RESPIRATORY_TRACT | 3 refills | Status: DC | PRN

## 2022-12-03 MED ORDER — ATORVASTATIN CALCIUM 40 MG PO TABS: 40.0000 mg | ORAL_TABLET | Freq: Every day | ORAL | 4 refills | Status: DC

## 2022-12-03 MED ORDER — FLUTICASONE-SALMETEROL 250-50 MCG/ACT IN AEPB: 1.0000 | INHALATION_SPRAY | Freq: Two times a day (BID) | RESPIRATORY_TRACT | 4 refills | Status: DC

## 2022-12-03 MED ORDER — LISINOPRIL 5 MG PO TABS: 5.0000 mg | ORAL_TABLET | Freq: Every day | ORAL | 4 refills | Status: DC

## 2022-12-03 NOTE — Assessment & Plan Note (Signed)
Chronic, stable.  Recommend use of CPAP 100% of the time, has benefit from this.

## 2022-12-03 NOTE — Assessment & Plan Note (Signed)
Chronic, stable.  Continue daily use of pads as needed, consider PT for pelvic floor if worsening.  Recommend focus on Kegel exercises and pelvic floor strengthening.

## 2022-12-03 NOTE — Assessment & Plan Note (Signed)
On screening 12/17/21.  She is taking Vitamin D and a multivitamin daily + walking.  Recommend she continue this.  Will plan on repeat DEXA around 12/18/2026.  Check vitamin D level today.

## 2022-12-03 NOTE — Assessment & Plan Note (Signed)
Chronic, stable.  BP at goal on recheck today and at goal on home checks.  Continue current medication regimen and adjust as needed.  Continue daily BP checks at home and focus on DASH diet regimen.  Discussed strict goal BP <130/80 due to history of stroke.  Labs today: CMP, CBC, TSH.  Return in 6 months.

## 2022-12-03 NOTE — Assessment & Plan Note (Signed)
Chronic, ongoing.  Continue current medication regimen and adjust as needed.  Consider spirometry next visit.

## 2022-12-03 NOTE — Assessment & Plan Note (Signed)
BMI 43.43 with HTN/HLD, history of CVA.  Recommended eating smaller high protein, low fat meals more frequently and exercising 30 mins a day 5 times a week with a goal of 10-15lb weight loss in the next 3 months. Patient voiced their understanding and motivation to adhere to these recommendations.

## 2022-12-03 NOTE — Assessment & Plan Note (Addendum)
Chronic, stable.  Continue current medication regimen and adjust as needed.  Recent LDL at goal, well below neuro goal <70 -- with new guidelines may benefit LDL <55.  Lipid panel today.

## 2022-12-03 NOTE — Assessment & Plan Note (Signed)
Chronic.  Noted on imaging 10/30/20.  Continue statin daily and ASA for prevention.

## 2022-12-03 NOTE — Progress Notes (Signed)
BP 114/76 (BP Location: Left Arm, Patient Position: Sitting, Cuff Size: Normal)   Pulse 63   Temp 97.8 F (36.6 C) (Oral)   Ht 5' 2.1" (1.577 m)   Wt 238 lb 3.2 oz (108 kg)   SpO2 97%   BMI 43.43 kg/m    Subjective:    Patient ID: Kristin Solis, female    DOB: [birth year], 67 y.o.   MRN: [id number]  HPI: Kristin Solis is a 67 y.o. female presenting on 12/03/2022 for comprehensive medical examination. Current medical complaints include:none  She currently lives with: husband Menopausal Symptoms: no  HYPERTENSION / HYPERLIPIDEMIA Continues Lisinopril 5 MG, ASA, and Atorvastatin 40 MG daily.  Using CPAP nightly, 100%, uses nose piece only with pads. History of CVA March 2020 and followed by neurology, Dr. Pearlean Brownie, saw in October 2021. Aortic atherosclerosis noted on imaging 10/30/20.   History of elevated glucose on labs, recent A1c 5.8% April. Satisfied with current treatment? yes Duration of hypertension: chronic BP monitoring frequency: daily BP range: 117/65 this morning - 110-120/ 70 range at home BP medication side effects: no Duration of hyperlipidemia: chronic Cholesterol medication side effects: no Cholesterol supplements: none Medication compliance: good compliance Aspirin: yes Recent stressors: no Recurrent headaches: no Visual changes: no Palpitations: no Dyspnea: no Chest pain: no Lower extremity edema: no -- just in hands on occasion Dizzy/lightheaded: no    ASTHMA Uses Advair every day.  Albuterol as needed Asthma status: stable Satisfied with current treatment?: yes Albuterol/rescue inhaler frequency: very rarely, only recent in [place] when had to climb several stairs Dyspnea frequency: no Wheezing frequency: no Cough frequency: none Nocturnal symptom frequency: no Limitation of activity: no Current upper respiratory symptoms: no Triggers: seasonal Home peak flows: none Last Spirometry: unknown Failed/intolerant to following asthma meds:  none Asthma meds in past: as above Aerochamber/spacer use: no Visits to ER or Urgent Care in past year: no Pneumovax: Up To Date Influenza: Up to Date   OSTEOPENIA Is taking Vitamin D and a multivitamin.  Last DEXA 12/17/21 with T-score -1.5.  Satisfied with current treatment?: yes Adequate calcium & vitamin D: yes Weight bearing exercises: yes   STRESS INCONTINENCE At baseline has occasional dribbling and incontinence episodes and uses briefs as needed.   Sees urology, last saw 08/18/21 for history of kidney stones.  Had cystoscopy 08/12/21 with stone removal.   Dysuria: no Urinary frequency: no Urgency: no Small volume voids: no Symptom severity: no Urinary incontinence: occasional Foul odor: no Hematuria: no Abdominal pain: no Back pain: no Suprapubic pain/pressure: no Flank pain: no  Depression Screen done today and results listed below:     12/03/2022    9:57 AM 06/03/2022    9:46 AM 03/25/2022    9:47 AM 12/02/2021    2:26 PM 11/03/2021   11:04 AM  Depression screen PHQ 2/9  Decreased Interest 0 0 0 0 0  Down, Depressed, Hopeless 0 0 0 0 0  PHQ - 2 Score 0 0 0 0 0  Altered sleeping 0 0 0 0 1  Tired, decreased energy 0 0 0 0 0  Change in appetite 0 0 0 1 0  Feeling bad or failure about yourself  0 0 0 0 2  Trouble concentrating 0 0 0 0 0  Moving slowly or fidgety/restless 0 0 0 0 0  Suicidal thoughts 0 0 0 0 0  PHQ-9 Score 0 0 0 1 3  Difficult doing work/chores Not difficult at all Not difficult at  all Not difficult at all Not difficult at all Not difficult at all      12/03/2022    9:59 AM 06/03/2022    9:46 AM 03/25/2022    9:48 AM 12/02/2021    2:26 PM  GAD 7 : Generalized Anxiety Score  Nervous, Anxious, on Edge 0 0 0 0  Control/stop worrying 0 0 0 0  Worry too much - different things 0 0 0 0  Trouble relaxing 0 0 0 0  Restless 0 0 0 0  Easily annoyed or irritable 0 0 0 0  Afraid - awful might happen 0 0 0 0  Total GAD 7 Score 0 0 0 0  Anxiety Difficulty  Not difficult at all Not difficult at all Not difficult at all Not difficult at all      11/03/2021   11:06 AM 12/02/2021    2:26 PM 03/25/2022    9:47 AM 06/03/2022    9:46 AM 12/03/2022    9:57 AM  Fall Risk  Falls in the past year? 1 0 0 0 0  Was there an injury with Fall? 0 0 0 0 0  Fall Risk Category Calculator 1 0 0 0 0  Fall Risk Category (Retired) Low Low     (RETIRED) Patient Fall Risk Level Low fall risk Low fall risk     Patient at Risk for Falls Due to No Fall Risks No Fall Risks No Fall Risks No Fall Risks No Fall Risks  Fall risk Follow up Falls evaluation completed Falls evaluation completed Falls evaluation completed Falls evaluation completed Falls evaluation completed    Functional Status Survey: Is the patient deaf or have difficulty hearing?: No Does the patient have difficulty seeing, even when wearing glasses/contacts?: No Does the patient have difficulty concentrating, remembering, or making decisions?: No Does the patient have difficulty walking or climbing stairs?: Yes Does the patient have difficulty dressing or bathing?: No Does the patient have difficulty doing errands alone such as visiting a doctor's office or shopping?: Yes    Past Medical History:  Past Medical History:  Diagnosis Date   Asthma    Hypertension    Stroke [facility])     Surgical History:  Past Surgical History:  Procedure Laterality Date   CYSTOSCOPY/URETEROSCOPY/HOLMIUM LASER/STENT PLACEMENT Right 08/12/2021   Procedure: CYSTOSCOPY/URETEROSCOPY/HOLMIUM LASER/STENT PLACEMENT/LITHOTRIPSY;  Surgeon: Riki Altes, MD;  Location: [facility] ORS;  Service: Urology;  Laterality: Right;   IR CT HEAD LTD  04/18/2018   IR INTRAVSC STENT CERV CAROTID W/O EMB-PROT MOD SED INC ANGIO  04/18/2018   IR PERCUTANEOUS ART THROMBECTOMY/INFUSION INTRACRANIAL INC DIAG ANGIO  04/18/2018   OOPHORECTOMY     RADIOLOGY WITH ANESTHESIA N/A 04/17/2018   Procedure: RADIOLOGY WITH ANESTHESIA;  Surgeon: Julieanne Cotton, MD;  Location: [facility] OR;  Service: Radiology;  Laterality: N/A;   TOTAL ABDOMINAL HYSTERECTOMY W/ BILATERAL SALPINGOOPHORECTOMY  2005   total    Medications:  Current Outpatient Medications on File Prior to Visit  Medication Sig   aspirin EC 81 MG EC tablet Take 1 tablet (81 mg total) by mouth daily.   fluticasone (FLONASE) 50 MCG/ACT nasal spray Use 2 spray(s) in each nostril once daily   No current facility-administered medications on file prior to visit.    Allergies:  No Known Allergies  Social History:  Social History   Socioeconomic History   Marital status: Married    Spouse name: Not on file   Number of children: Not on file   Years  of education: Not on file   Highest education level: Not on file  Occupational History   Not on file  Tobacco Use   Smoking status: Never   Smokeless tobacco: Never  Vaping Use   Vaping status: Never Used  Substance and Sexual Activity   Alcohol use: No   Drug use: No   Sexual activity: Never  Other Topics Concern   Not on file  Social History Narrative   Not on file   Social Determinants of Health   Financial Resource Strain: Low Risk  (12/03/2022)   Overall Financial Resource Strain (CARDIA)    Difficulty of Paying Living Expenses: Not hard at all  Food Insecurity: No Food Insecurity (12/03/2022)   Hunger Vital Sign    Worried About Running Out of Food in the Last Year: Never true    Ran Out of Food in the Last Year: Never true  Transportation Needs: No Transportation Needs (12/03/2022)   PRAPARE - Administrator, Civil Service (Medical): No    Lack of Transportation (Non-Medical): No  Physical Activity: Insufficiently Active (12/03/2022)   Exercise Vital Sign    Days of Exercise per Week: 2 days    Minutes of Exercise per Session: 20 min  Stress: No Stress Concern Present (12/03/2022)   [employer] of Occupational Health - Occupational Stress Questionnaire    Feeling of Stress : Not at all   Social Connections: Socially Isolated (12/03/2022)   Social Connection and Isolation Panel [NHANES]    Frequency of Communication with Friends and Family: Once a week    Frequency of Social Gatherings with Friends and Family: Never    Attends Religious Services: Never    Database administrator or Organizations: No    Attends Banker Meetings: Never    Marital Status: Married  Catering manager Violence: Not At Risk (12/03/2022)   Humiliation, Afraid, Rape, and Kick questionnaire    Fear of Current or Ex-Partner: No    Emotionally Abused: No    Physically Abused: No    Sexually Abused: No   Social History   Tobacco Use  Smoking Status Never  Smokeless Tobacco Never   Social History   Substance and Sexual Activity  Alcohol Use No    Family History:  Family History  Problem Relation Age of Onset   Cancer Mother        lung   Cancer Father        brain   Diabetes Sister    Dementia Sister    Heart disease Sister        CHF   Alcohol abuse Brother    Stroke Son        x2   CAD Maternal Grandmother    Emphysema Paternal Grandfather    Breast cancer Neg Hx     Past medical history, surgical history, medications, allergies, family history and social history reviewed with patient today and changes made to appropriate areas of the chart.   ROS All other ROS negative except what is listed above and in the HPI.      Objective:    BP 114/76 (BP Location: Left Arm, Patient Position: Sitting, Cuff Size: Normal)   Pulse 63   Temp 97.8 F (36.6 C) (Oral)   Ht 5' 2.1" (1.577 m)   Wt 238 lb 3.2 oz (108 kg)   SpO2 97%   BMI 43.43 kg/m   Wt Readings from Last 3 Encounters:  12/03/22 238 lb 3.2  oz (108 kg)  06/03/22 241 lb 1.6 oz (109.4 kg)  03/25/22 241 lb 8 oz (109.5 kg)    Physical Exam Vitals and nursing note reviewed. Exam conducted with a chaperone present.  Constitutional:      General: She is awake. She is not in acute distress.    Appearance:  She is well-developed and well-groomed. She is not ill-appearing or toxic-appearing.  HENT:     Head: Normocephalic and atraumatic.     Right Ear: Hearing, tympanic membrane, ear canal and external ear normal. No drainage.     Left Ear: Hearing, tympanic membrane, ear canal and external ear normal. No drainage.     Nose: Nose normal.     Right Sinus: No maxillary sinus tenderness or frontal sinus tenderness.     Left Sinus: No maxillary sinus tenderness or frontal sinus tenderness.     Mouth/Throat:     Mouth: Mucous membranes are moist.     Pharynx: Oropharynx is clear. Uvula midline. No pharyngeal swelling, oropharyngeal exudate or posterior oropharyngeal erythema.  Eyes:     General: Lids are normal.        Right eye: No discharge.        Left eye: No discharge.     Extraocular Movements: Extraocular movements intact.     Conjunctiva/sclera: Conjunctivae normal.     Pupils: Pupils are equal, round, and reactive to light.     Visual Fields: Right eye visual fields normal and left eye visual fields normal.  Neck:     Thyroid: No thyromegaly.     Vascular: No carotid bruit.     Trachea: Trachea normal.  Cardiovascular:     Rate and Rhythm: Normal rate and regular rhythm.     Heart sounds: Normal heart sounds. No murmur heard.    No gallop.  Pulmonary:     Effort: Pulmonary effort is normal. No accessory muscle usage or respiratory distress.     Breath sounds: Normal breath sounds.  Chest:  Breasts:    Right: Normal.     Left: Normal.  Abdominal:     General: Bowel sounds are normal.     Palpations: Abdomen is soft. There is no hepatomegaly or splenomegaly.     Tenderness: There is no abdominal tenderness.  Musculoskeletal:        General: Normal range of motion.     Cervical back: Normal range of motion and neck supple.     Right lower leg: No edema.     Left lower leg: No edema.  Lymphadenopathy:     Head:     Right side of head: No submental, submandibular, tonsillar,  preauricular or posterior auricular adenopathy.     Left side of head: No submental, submandibular, tonsillar, preauricular or posterior auricular adenopathy.     Cervical: No cervical adenopathy.     Upper Body:     Right upper body: No supraclavicular, axillary or pectoral adenopathy.     Left upper body: No supraclavicular, axillary or pectoral adenopathy.  Skin:    General: Skin is warm and dry.     Capillary Refill: Capillary refill takes less than 2 seconds.     Findings: No rash.  Neurological:     Mental Status: She is alert and oriented to person, place, and time.     Gait: Gait is intact.     Deep Tendon Reflexes: Reflexes are normal and symmetric.     Reflex Scores:      Brachioradialis reflexes are  2+ on the right side and 2+ on the left side.      Patellar reflexes are 2+ on the right side and 2+ on the left side. Psychiatric:        Attention and Perception: Attention normal.        Mood and Affect: Mood normal.        Speech: Speech normal.        Behavior: Behavior normal. Behavior is cooperative.        Thought Content: Thought content normal.        Judgment: Judgment normal.       12/03/2022    9:59 AM 11/03/2021   11:07 AM 11/02/2020   10:14 AM  6CIT Screen  What Year? 0 points 0 points 0 points  What month? 0 points 0 points 0 points  What time? 0 points 0 points 0 points  Count back from 20 0 points 0 points 0 points  Months in reverse 0 points 0 points 0 points  Repeat phrase 2 points 2 points 2 points  Total Score 2 points 2 points 2 points   Results for orders placed or performed in visit on 06/03/22  Comprehensive metabolic panel  Result Value Ref Range   Glucose 104 (H) 70 - 99 mg/dL   BUN 20 8 - 27 mg/dL   Creatinine, Ser 1.61 0.57 - 1.00 mg/dL   eGFR 68 >09 UE/AVW/0.98   BUN/Creatinine Ratio 22 12 - 28   Sodium 143 134 - 144 mmol/L   Potassium 4.3 3.5 - 5.2 mmol/L   Chloride 107 (H) 96 - 106 mmol/L   CO2 24 20 - 29 mmol/L   Calcium 9.8  8.7 - 10.3 mg/dL   Total Protein 6.4 6.0 - 8.5 g/dL   Albumin 4.0 3.9 - 4.9 g/dL   Globulin, Total 2.4 1.5 - 4.5 g/dL   Albumin/Globulin Ratio 1.7 1.2 - 2.2   Bilirubin Total 0.4 0.0 - 1.2 mg/dL   Alkaline Phosphatase 114 44 - 121 IU/L   AST 21 0 - 40 IU/L   ALT 25 0 - 32 IU/L  Lipid Panel w/o Chol/HDL Ratio  Result Value Ref Range   Cholesterol, Total 131 100 - 199 mg/dL   Triglycerides 65 0 - 149 mg/dL   HDL 68 >11 mg/dL   VLDL Cholesterol Cal 14 5 - 40 mg/dL   LDL Chol Calc (NIH) 49 0 - 99 mg/dL  HgB B1Y  Result Value Ref Range   Hgb A1c MFr Bld 5.8 (H) 4.8 - 5.6 %   Est. average glucose Bld gHb Est-mCnc 120 mg/dL      Assessment & Plan:   Problem List Items Addressed This Visit       Cardiovascular and Mediastinum   Aortic atherosclerosis (HCC)    Chronic.  Noted on imaging 10/30/20.  Continue statin daily and ASA for prevention.      Relevant Medications   lisinopril (ZESTRIL) 5 MG tablet   atorvastatin (LIPITOR) 40 MG tablet   Other Relevant Orders   Lipid Panel w/o Chol/HDL Ratio   Essential hypertension, benign    Chronic, stable.  BP at goal on recheck today and at goal on home checks.  Continue current medication regimen and adjust as needed.  Continue daily BP checks at home and focus on DASH diet regimen.  Discussed strict goal BP <130/80 due to history of stroke.  Labs today: CMP, CBC, TSH.  Return in 6 months.      Relevant  Medications   lisinopril (ZESTRIL) 5 MG tablet   atorvastatin (LIPITOR) 40 MG tablet   Other Relevant Orders   CBC with Differential/Platelet   TSH     Respiratory   Asthma    Chronic, ongoing.  Continue current medication regimen and adjust as needed.  Consider spirometry next visit.        Relevant Medications   fluticasone-salmeterol (ADVAIR) 250-50 MCG/ACT AEPB   albuterol (VENTOLIN HFA) 108 (90 Base) MCG/ACT inhaler   Obstructive sleep apnea    Chronic, stable.  Recommend use of CPAP 100% of the time, has benefit from  this.        Endocrine   IFG (impaired fasting glucose)    Ongoing.  Recent A1c 5.8%.  Check A1c today and treat as needed.  Continue focus on healthy diet and regular activity.      Relevant Orders   HgB A1c     Musculoskeletal and Integument   Osteopenia of neck of right femur    On screening 12/17/21.  She is taking Vitamin D and a multivitamin daily + walking.  Recommend she continue this.  Will plan on repeat DEXA around 12/18/2026.  Check vitamin D level today.      Relevant Orders   VITAMIN D 25 Hydroxy (Vit-D Deficiency, Fractures)     Other   History of stroke    Has improved greatly since event in March 2020.  Continue focus on prevention with BP <130/80, A1c <6.5%, and LDL <55.  Lipid panel today.      Mixed hyperlipidemia    Chronic, stable.  Continue current medication regimen and adjust as needed.  Recent LDL at goal, well below neuro goal <70 -- with new guidelines may benefit LDL <55.  Lipid panel today.      Relevant Medications   lisinopril (ZESTRIL) 5 MG tablet   atorvastatin (LIPITOR) 40 MG tablet   Other Relevant Orders   Comprehensive metabolic panel   Lipid Panel w/o Chol/HDL Ratio   Morbid obesity (HCC)    BMI 43.43 with HTN/HLD, history of CVA.  Recommended eating smaller high protein, low fat meals more frequently and exercising 30 mins a day 5 times a week with a goal of 10-15lb weight loss in the next 3 months. Patient voiced their understanding and motivation to adhere to these recommendations.       Stress incontinence    Chronic, stable.  Continue daily use of pads as needed, consider PT for pelvic floor if worsening.  Recommend focus on Kegel exercises and pelvic floor strengthening.      Other Visit Diagnoses     Medicare annual wellness visit, subsequent    -  Primary   Medicare wellness due and performed with patient today.   Flu vaccine need       Flu vaccine in office today, educated patient.   Relevant Orders   Flu Vaccine  Trivalent High Dose (Fluad) (Completed)   Encounter for annual physical exam       Annual physical today with labs and health maintenance reviewed, discussed with patient.        Follow up plan: Return in about 22 weeks (around 05/06/2023) for HTN/HLD, IFG -- would like to come around same time as husband.   LABORATORY TESTING:  - Pap smear: not applicable  IMMUNIZATIONS:   - Tdap: Tetanus vaccination status reviewed: last tetanus booster within 10 years. - Influenza: Up to date - Pneumovax: Up to date - Prevnar: Up to date -  COVID: Up to date - HPV: Not applicable - Shingrix vaccine: Up to date  SCREENING: -Mammogram: Up to date  - Colonoscopy: Refused  - Bone Density: Up to date  -Hearing Test: Not applicable  -Spirometry: Not applicable   PATIENT COUNSELING:   Advised to take 1 mg of folate supplement per day if capable of pregnancy.   Sexuality: Discussed sexually transmitted diseases, partner selection, use of condoms, avoidance of unintended pregnancy  and contraceptive alternatives.   Advised to avoid cigarette smoking.  I discussed with the patient that most people either abstain from alcohol or drink within safe limits (<=14/week and <=4 drinks/occasion for males, <=7/weeks and <= 3 drinks/occasion for females) and that the risk for alcohol disorders and other health effects rises proportionally with the number of drinks per week and how often a drinker exceeds daily limits.  Discussed cessation/primary prevention of drug use and availability of treatment for abuse.   Diet: Encouraged to adjust caloric intake to maintain  or achieve ideal body weight, to reduce intake of dietary saturated fat and total fat, to limit sodium intake by avoiding high sodium foods and not adding table salt, and to maintain adequate dietary potassium and calcium preferably from fresh fruits, vegetables, and low-fat dairy products.    Stressed the importance of regular exercise  Injury  prevention: Discussed safety belts, safety helmets, smoke detector, smoking near bedding or upholstery.   Dental health: Discussed importance of regular tooth brushing, flossing, and dental visits.    NEXT PREVENTATIVE PHYSICAL DUE IN 1 YEAR. Return in about 22 weeks (around 05/06/2023) for HTN/HLD, IFG -- would like to come around same time as husband.

## 2022-12-03 NOTE — Assessment & Plan Note (Signed)
Has improved greatly since event in March 2020.  Continue focus on prevention with BP <130/80, A1c <6.5%, and LDL <55.  Lipid panel today.

## 2022-12-03 NOTE — Assessment & Plan Note (Signed)
Ongoing.  Recent A1c 5.8%.  Check A1c today and treat as needed.  Continue focus on healthy diet and regular activity.

## 2022-12-04 LAB — CBC WITH DIFFERENTIAL/PLATELET
Basophils Absolute: 0 10*3/uL (ref 0.0–0.2)
Basos: 1 %
EOS (ABSOLUTE): 0.1 10*3/uL (ref 0.0–0.4)
Eos: 2 %
Hematocrit: 43.4 % (ref 34.0–46.6)
Hemoglobin: 13.9 g/dL (ref 11.1–15.9)
Immature Grans (Abs): 0 10*3/uL (ref 0.0–0.1)
Immature Granulocytes: 0 %
Lymphocytes Absolute: 1.5 10*3/uL (ref 0.7–3.1)
Lymphs: 26 %
MCH: 29.6 pg (ref 26.6–33.0)
MCHC: 32 g/dL (ref 31.5–35.7)
MCV: 92 fL (ref 79–97)
Monocytes Absolute: 0.6 10*3/uL (ref 0.1–0.9)
Monocytes: 10 %
Neutrophils Absolute: 3.6 10*3/uL (ref 1.4–7.0)
Neutrophils: 61 %
Platelets: 263 10*3/uL (ref 150–450)
RBC: 4.7 x10E6/uL (ref 3.77–5.28)
RDW: 12.8 % (ref 11.7–15.4)
WBC: 5.9 10*3/uL (ref 3.4–10.8)

## 2022-12-04 LAB — COMPREHENSIVE METABOLIC PANEL
ALT: 22 [IU]/L (ref 0–32)
AST: 23 [IU]/L (ref 0–40)
Albumin: 4.1 g/dL (ref 3.9–4.9)
Alkaline Phosphatase: 130 [IU]/L — ABNORMAL HIGH (ref 44–121)
BUN/Creatinine Ratio: 20 (ref 12–28)
BUN: 18 mg/dL (ref 8–27)
Bilirubin Total: 0.4 mg/dL (ref 0.0–1.2)
CO2: 23 mmol/L (ref 20–29)
Calcium: 9.7 mg/dL (ref 8.7–10.3)
Chloride: 104 mmol/L (ref 96–106)
Creatinine, Ser: 0.89 mg/dL (ref 0.57–1.00)
Globulin, Total: 2.4 g/dL (ref 1.5–4.5)
Glucose: 90 mg/dL (ref 70–99)
Potassium: 4.2 mmol/L (ref 3.5–5.2)
Sodium: 141 mmol/L (ref 134–144)
Total Protein: 6.5 g/dL (ref 6.0–8.5)
eGFR: 71 mL/min/{1.73_m2} (ref >59)

## 2022-12-04 LAB — HEMOGLOBIN A1C
Est. average glucose Bld gHb Est-mCnc: 117 mg/dL
Hgb A1c MFr Bld: 5.7 % — ABNORMAL HIGH (ref 4.8–5.6)

## 2022-12-04 LAB — LIPID PANEL W/O CHOL/HDL RATIO
Cholesterol, Total: 134 mg/dL (ref 100–199)
HDL: 68 mg/dL (ref >39)
LDL Chol Calc (NIH): 50 mg/dL (ref 0–99)
Triglycerides: 84 mg/dL (ref 0–149)
VLDL Cholesterol Cal: 16 mg/dL (ref 5–40)

## 2022-12-04 LAB — VITAMIN D 25 HYDROXY (VIT D DEFICIENCY, FRACTURES): Vit D, 25-Hydroxy: 59.4 ng/mL (ref 30.0–100.0)

## 2022-12-04 LAB — TSH: TSH: 1.14 u[IU]/mL (ref 0.450–4.500)

## 2022-12-04 NOTE — Progress Notes (Signed)
Contacted via MyChart   Good afternoon Kristin Solis, your labs have returned and overall look fantastic.  A1c remains in prediabetic range at 5.7%.  Recommend continued focus on diet and regular activity.  Continue all current medications.  Any questions? Keep being amazing!!  Thank you for allowing me to participate in your care.  I appreciate you. Kindest regards, Jushua Waltman

## 2022-12-24 DIAGNOSIS — G4733 Obstructive sleep apnea (adult) (pediatric): Secondary | ICD-10-CM | POA: Diagnosis not present

## 2023-02-24 DIAGNOSIS — H40013 Open angle with borderline findings, low risk, bilateral: Secondary | ICD-10-CM | POA: Diagnosis not present

## 2023-02-24 DIAGNOSIS — H2513 Age-related nuclear cataract, bilateral: Secondary | ICD-10-CM | POA: Diagnosis not present

## 2023-02-24 DIAGNOSIS — H04123 Dry eye syndrome of bilateral lacrimal glands: Secondary | ICD-10-CM | POA: Diagnosis not present

## 2023-03-30 DIAGNOSIS — G4733 Obstructive sleep apnea (adult) (pediatric): Secondary | ICD-10-CM | POA: Diagnosis not present

## 2023-05-02 NOTE — Patient Instructions (Signed)
 Be Involved in Caring For Your Health:  Taking Medications When medications are taken as directed, they can greatly improve your health. But if they are not taken as prescribed, they may not work. In some cases, not taking them correctly can be harmful. To help ensure your treatment remains effective and safe, understand your medications and how to take them. Bring your medications to each visit for review by your provider.  Your lab results, notes, and after visit summary will be available on My Chart. We strongly encourage you to use this feature. If lab results are abnormal the clinic will contact you with the appropriate steps. If the clinic does not contact you assume the results are satisfactory. You can always view your results on My Chart. If you have questions regarding your health or results, please contact the clinic during office hours. You can also ask questions on My Chart.  We at [facility] are grateful that you chose [place] to provide your care. We strive to provide evidence-based and compassionate care and are always looking for feedback. If you get a survey from the clinic please complete this so we can hear your opinions.  Heart-Healthy Eating Plan Many factors influence your heart health, including eating and exercise habits. Heart health is also called coronary health. Coronary risk increases with abnormal blood fat (lipid) levels. A heart-healthy eating plan includes limiting unhealthy fats, increasing healthy fats, limiting salt (sodium) intake, and making other diet and lifestyle changes. What is my plan? Your health care provider may recommend that: You limit your fat intake to _________% or less of your total calories each day. You limit your saturated fat intake to _________% or less of your total calories each day. You limit the amount of cholesterol in your diet to less than _________ mg per day. You limit the amount of sodium in your diet to less than _________  mg per day. What are tips for following this plan? Cooking Cook foods using methods other than frying. Baking, boiling, grilling, and broiling are all good options. Other ways to reduce fat include: Removing the skin from poultry. Removing all visible fats from meats. Steaming vegetables in water or broth. Meal planning  At meals, imagine dividing your plate into fourths: Fill one-half of your plate with vegetables and green salads. Fill one-fourth of your plate with whole grains. Fill one-fourth of your plate with lean protein foods. Eat 2-4 cups of vegetables per day. One cup of vegetables equals 1 cup (91 g) broccoli or cauliflower florets, 2 medium carrots, 1 large bell pepper, 1 large sweet potato, 1 large tomato, 1 medium white potato, 2 cups (150 g) raw leafy greens. Eat 1-2 cups of fruit per day. One cup of fruit equals 1 small apple, 1 large banana, 1 cup (237 g) mixed fruit, 1 large orange,  cup (82 g) dried fruit, 1 cup (240 mL) 100% fruit juice. Eat more foods that contain soluble fiber. Examples include apples, broccoli, carrots, beans, peas, and barley. Aim to get 25-30 g of fiber per day. Increase your consumption of legumes, nuts, and seeds to 4-5 servings per week. One serving of dried beans or legumes equals  cup (90 g) cooked, 1 serving of nuts is  oz (12 almonds, 24 pistachios, or 7 walnut halves), and 1 serving of seeds equals  oz (8 g). Fats Choose healthy fats more often. Choose monounsaturated and polyunsaturated fats, such as olive and canola oils, avocado oil, flaxseeds, walnuts, almonds, and seeds. Eat  more omega-3 fats. Choose salmon, mackerel, sardines, tuna, flaxseed oil, and ground flaxseeds. Aim to eat fish at least 2 times each week. Check food labels carefully to identify foods with trans fats or high amounts of saturated fat. Limit saturated fats. These are found in animal products, such as meats, butter, and cream. Plant sources of saturated fats  include palm oil, palm kernel oil, and coconut oil. Avoid foods with partially hydrogenated oils in them. These contain trans fats. Examples are stick margarine, some tub margarines, cookies, crackers, and other baked goods. Avoid fried foods. General information Eat more home-cooked food and less restaurant, buffet, and fast food. Limit or avoid alcohol. Limit foods that are high in added sugar and simple starches such as foods made using white refined flour (white breads, pastries, sweets). Lose weight if you are overweight. Losing just 5-10% of your body weight can help your overall health and prevent diseases such as diabetes and heart disease. Monitor your sodium intake, especially if you have high blood pressure. Talk with your health care provider about your sodium intake. Try to incorporate more vegetarian meals weekly. What foods should I eat? Fruits All fresh, canned (in natural juice), or frozen fruits. Vegetables Fresh or frozen vegetables (raw, steamed, roasted, or grilled). Green salads. Grains Most grains. Choose whole wheat and whole grains most of the time. Rice and pasta, including brown rice and pastas made with whole wheat. Meats and other proteins Lean, well-trimmed beef, veal, pork, and lamb. Chicken and [place] without skin. All fish and shellfish. Wild duck, rabbit, pheasant, and venison. Egg whites or low-cholesterol egg substitutes. Dried beans, peas, lentils, and tofu. Seeds and most nuts. Dairy Low-fat or nonfat cheeses, including ricotta and mozzarella. Skim or 1% milk (liquid, powdered, or evaporated). Buttermilk made with low-fat milk. Nonfat or low-fat yogurt. Fats and oils Non-hydrogenated (trans-free) margarines. Vegetable oils, including soybean, sesame, sunflower, olive, avocado, peanut, safflower, corn, canola, and cottonseed. Salad dressings or mayonnaise made with a vegetable oil. Beverages Water (mineral or sparkling). Coffee and tea. Unsweetened ice  tea. Diet beverages. Sweets and desserts Sherbet, gelatin, and fruit ice. Small amounts of dark chocolate. Limit all sweets and desserts. Seasonings and condiments All seasonings and condiments. The items listed above may not be a complete list of foods and beverages you can eat. Contact a dietitian for more options. What foods should I avoid? Fruits Canned fruit in heavy syrup. Fruit in cream or butter sauce. Fried fruit. Limit coconut. Vegetables Vegetables cooked in cheese, cream, or butter sauce. Fried vegetables. Grains Breads made with saturated or trans fats, oils, or whole milk. Croissants. Sweet rolls. Donuts. High-fat crackers, such as cheese crackers and chips. Meats and other proteins Fatty meats, such as hot dogs, ribs, sausage, bacon, rib-eye roast or steak. High-fat deli meats, such as salami and bologna. Caviar. Domestic duck and goose. Organ meats, such as liver. Dairy Cream, sour cream, cream cheese, and creamed cottage cheese. Whole-milk cheeses. Whole or 2% milk (liquid, evaporated, or condensed). Whole buttermilk. Cream sauce or high-fat cheese sauce. Whole-milk yogurt. Fats and oils Meat fat, or shortening. Cocoa butter, hydrogenated oils, palm oil, coconut oil, palm kernel oil. Solid fats and shortenings, including bacon fat, salt pork, lard, and butter. Nondairy cream substitutes. Salad dressings with cheese or sour cream. Beverages Regular sodas and any drinks with added sugar. Sweets and desserts Frosting. Pudding. Cookies. Cakes. Pies. Milk chocolate or white chocolate. Buttered syrups. Full-fat ice cream or ice cream drinks. The items listed above may  not be a complete list of foods and beverages to avoid. Contact a dietitian for more information. Summary Heart-healthy meal planning includes limiting unhealthy fats, increasing healthy fats, limiting salt (sodium) intake and making other diet and lifestyle changes. Lose weight if you are overweight. Losing just  5-10% of your body weight can help your overall health and prevent diseases such as diabetes and heart disease. Focus on eating a balance of foods, including fruits and vegetables, low-fat or nonfat dairy, lean protein, nuts and legumes, whole grains, and heart-healthy oils and fats. This information is not intended to replace advice given to you by your health care provider. Make sure you discuss any questions you have with your health care provider. Document Revised: 03/10/2021 Document Reviewed: 03/10/2021 Elsevier Patient Education  2024 [employer].

## 2023-05-06 ENCOUNTER — Encounter: Payer: Self-pay | Admitting: Nurse Practitioner

## 2023-05-06 ENCOUNTER — Ambulatory Visit (INDEPENDENT_AMBULATORY_CARE_PROVIDER_SITE_OTHER): Payer: 59 | Admitting: Nurse Practitioner

## 2023-05-06 DIAGNOSIS — G4733 Obstructive sleep apnea (adult) (pediatric): Secondary | ICD-10-CM

## 2023-05-06 DIAGNOSIS — R7301 Impaired fasting glucose: Secondary | ICD-10-CM | POA: Diagnosis not present

## 2023-05-06 DIAGNOSIS — Z8673 Personal history of transient ischemic attack (TIA), and cerebral infarction without residual deficits: Secondary | ICD-10-CM | POA: Diagnosis not present

## 2023-05-06 DIAGNOSIS — J4540 Moderate persistent asthma, uncomplicated: Secondary | ICD-10-CM | POA: Diagnosis not present

## 2023-05-06 DIAGNOSIS — I700 Atherosclerosis of aorta: Secondary | ICD-10-CM

## 2023-05-06 DIAGNOSIS — N393 Stress incontinence (female) (male): Secondary | ICD-10-CM

## 2023-05-06 DIAGNOSIS — E782 Mixed hyperlipidemia: Secondary | ICD-10-CM

## 2023-05-06 DIAGNOSIS — R011 Cardiac murmur, unspecified: Secondary | ICD-10-CM

## 2023-05-06 DIAGNOSIS — I890 Lymphedema, not elsewhere classified: Secondary | ICD-10-CM

## 2023-05-06 DIAGNOSIS — I10 Essential (primary) hypertension: Secondary | ICD-10-CM | POA: Diagnosis not present

## 2023-05-06 LAB — MICROALBUMIN, URINE WAIVED
Creatinine, Urine Waived: 300 mg/dL (ref 10–300)
Microalb, Ur Waived: 30 mg/L — ABNORMAL HIGH (ref 0–19)
Microalb/Creat Ratio: <30 mg/g (ref <30)

## 2023-05-06 NOTE — Progress Notes (Signed)
 t  BP (!) 146/69 (BP Location: Right Arm, Cuff Size: Large)   Pulse 62   Temp 97.8 F (36.6 C) (Oral)   Ht 5' 2.5" (1.588 m)   Wt 241 lb (109.3 kg)   SpO2 97%   BMI 43.38 kg/m    Subjective:    Patient ID: Kristin Solis, female    DOB: 1955/02/19, 68 y.o.   MRN: [id number]  HPI: Kristin Solis is a 68 y.o. female  Chief Complaint  Patient presents with   Hypertension   Hyperlipidemia   IFG   Asthma   Urinary Incontinence   HYPERTENSION / HYPERLIPIDEMIA Continues Lisinopril 5 MG, ASA, and Atorvastatin 40 MG daily.  Continues to use CPAP nightly, 100%, uses nose piece only. Aortic atherosclerosis noted on imaging 10/30/20. History of CVA March 2020.  Followed with neurology in past, Dr. Pearlean Brownie, saw in October 2021.   Satisfied with current treatment? yes Duration of hypertension: chronic BP monitoring frequency: daily BP range: after medication 147/71 this morning BP medication side effects: no Duration of hyperlipidemia: chronic Cholesterol medication side effects: no Cholesterol supplements: none Medication compliance: good compliance Aspirin: yes Recent stressors: no Recurrent headaches: no Visual changes: no Palpitations: no Dyspnea: no Chest pain: no Lower extremity edema: no Dizzy/lightheaded: occasional  Impaired Fasting Glucose HbA1C:  Lab Results  Component Value Date   HGBA1C 5.7 (H) 12/03/2022  Duration of elevated blood sugar: years Polydipsia: no Polyuria: no Weight change: no Visual disturbance: no Glucose Monitoring: no    Accucheck frequency: Not Checking    Fasting glucose:     Post prandial:  Diabetic Education: Not Completed Family history of diabetes: yes - sister    ASTHMA Continues Advair every day. Albuterol as needed.   Asthma status: stable Satisfied with current treatment?: yes Albuterol/rescue inhaler frequency: rarely Dyspnea frequency: no Wheezing frequency: no Cough frequency: no Nocturnal symptom frequency:  no Limitation of activity: no Current upper respiratory symptoms: no Triggers: seasonal Home peak flows:none Last Spirometry: unknown Asthma meds in past: as above Aerochamber/spacer use: no Visits to ER or Urgent Care in past year: no Pneumovax: Up To Date Influenza: Up to Date   STRESS INCONTINENCE Last saw urology 08/18/21 for history of kidney stones.  Cystoscopy 08/12/21.  Continues to have occasional dribbling and incontinence episodes -- uses briefs at home.     Dysuria: no Urinary frequency: no Urgency: no Small volume voids: no Symptom severity: no Urinary incontinence: occasional Foul odor: no Hematuria: no Abdominal pain: no Back pain: no Suprapubic pain/pressure: no Flank pain: no  Relevant past medical, surgical, family and social history reviewed and updated as indicated. Interim medical history since our last visit reviewed. Allergies and medications reviewed and updated.  Review of Systems  Constitutional:  Negative for activity change, appetite change, diaphoresis, fatigue and fever.  Respiratory:  Negative for cough, chest tightness and shortness of breath.   Cardiovascular:  Negative for chest pain, palpitations and leg swelling.  Gastrointestinal: Negative.   Neurological:  Negative for dizziness, syncope, speech difficulty, weakness, light-headedness, numbness and headaches.  Psychiatric/Behavioral: Negative.      Per HPI unless specifically indicated above     Objective:    BP (!) 146/69 (BP Location: Right Arm, Cuff Size: Large)   Pulse 62   Temp 97.8 F (36.6 C) (Oral)   Ht 5' 2.5" (1.588 m)   Wt 241 lb (109.3 kg)   SpO2 97%   BMI 43.38 kg/m   Wt Readings from Last 3  Encounters:  05/06/23 241 lb (109.3 kg)  12/03/22 238 lb 3.2 oz (108 kg)  06/03/22 241 lb 1.6 oz (109.4 kg)    Physical Exam Vitals and nursing note reviewed.  Constitutional:      General: She is awake. She is not in acute distress.    Appearance: She is well-developed.  She is obese. She is not ill-appearing.  HENT:     Head: Normocephalic.     Right Ear: Hearing, ear canal and external ear normal. No drainage.     Left Ear: Hearing, ear canal and external ear normal. No drainage.  Eyes:     General: Lids are normal.        Right eye: No discharge.        Left eye: No discharge.     Conjunctiva/sclera: Conjunctivae normal.     Pupils: Pupils are equal, round, and reactive to light.  Neck:     Thyroid: No thyromegaly.     Vascular: No carotid bruit.  Cardiovascular:     Rate and Rhythm: Normal rate and regular rhythm.     Heart sounds: Murmur heard.     Systolic murmur is present with a grade of 2/6.     No gallop.  Pulmonary:     Effort: Pulmonary effort is normal. No accessory muscle usage or respiratory distress.     Breath sounds: Normal breath sounds.  Abdominal:     General: Bowel sounds are normal.     Palpations: Abdomen is soft.  Musculoskeletal:     Right hand: Normal.     Left hand: Normal.     Cervical back: Normal range of motion and neck supple.     Right lower leg: 1+ Edema present.     Left lower leg: 1+ Edema present.  Skin:    General: Skin is warm and dry.  Neurological:     Mental Status: She is alert and oriented to person, place, and time.  Psychiatric:        Attention and Perception: Attention normal.        Mood and Affect: Mood normal.        Speech: Speech normal.        Behavior: Behavior normal. Behavior is cooperative.        Thought Content: Thought content normal.    Results for orders placed or performed in visit on 12/03/22  CBC with Differential/Platelet   Collection Time: 12/03/22 10:40 AM  Result Value Ref Range   WBC 5.9 3.4 - 10.8 x10E3/uL   RBC 4.70 3.77 - 5.28 x10E6/uL   Hemoglobin 13.9 11.1 - 15.9 g/dL   Hematocrit 16.1 09.6 - 46.6 %   MCV 92 79 - 97 fL   MCH 29.6 26.6 - 33.0 pg   MCHC 32.0 31.5 - 35.7 g/dL   RDW 04.5 40.9 - 81.1 %   Platelets 263 150 - 450 x10E3/uL   Neutrophils 61 Not  Estab. %   Lymphs 26 Not Estab. %   Monocytes 10 Not Estab. %   Eos 2 Not Estab. %   Basos 1 Not Estab. %   Neutrophils Absolute 3.6 1.4 - 7.0 x10E3/uL   Lymphocytes Absolute 1.5 0.7 - 3.1 x10E3/uL   Monocytes Absolute 0.6 0.1 - 0.9 x10E3/uL   EOS (ABSOLUTE) 0.1 0.0 - 0.4 x10E3/uL   Basophils Absolute 0.0 0.0 - 0.2 x10E3/uL   Immature Granulocytes 0 Not Estab. %   Immature Grans (Abs) 0.0 0.0 - 0.1 x10E3/uL  Comprehensive  metabolic panel   Collection Time: 12/03/22 10:40 AM  Result Value Ref Range   Glucose 90 70 - 99 mg/dL   BUN 18 8 - 27 mg/dL   Creatinine, Ser 7.84 0.57 - 1.00 mg/dL   eGFR 71 >69 GE/XBM/8.41   BUN/Creatinine Ratio 20 12 - 28   Sodium 141 134 - 144 mmol/L   Potassium 4.2 3.5 - 5.2 mmol/L   Chloride 104 96 - 106 mmol/L   CO2 23 20 - 29 mmol/L   Calcium 9.7 8.7 - 10.3 mg/dL   Total Protein 6.5 6.0 - 8.5 g/dL   Albumin 4.1 3.9 - 4.9 g/dL   Globulin, Total 2.4 1.5 - 4.5 g/dL   Bilirubin Total 0.4 0.0 - 1.2 mg/dL   Alkaline Phosphatase 130 (H) 44 - 121 IU/L   AST 23 0 - 40 IU/L   ALT 22 0 - 32 IU/L  TSH   Collection Time: 12/03/22 10:40 AM  Result Value Ref Range   TSH 1.140 0.450 - 4.500 uIU/mL  Lipid Panel w/o Chol/HDL Ratio   Collection Time: 12/03/22 10:40 AM  Result Value Ref Range   Cholesterol, Total 134 100 - 199 mg/dL   Triglycerides 84 0 - 149 mg/dL   HDL 68 >32 mg/dL   VLDL Cholesterol Cal 16 5 - 40 mg/dL   LDL Chol Calc (NIH) 50 0 - 99 mg/dL  VITAMIN D 25 Hydroxy (Vit-D Deficiency, Fractures)   Collection Time: 12/03/22 10:40 AM  Result Value Ref Range   Vit D, 25-Hydroxy 59.4 30.0 - 100.0 ng/mL  HgB A1c   Collection Time: 12/03/22 10:40 AM  Result Value Ref Range   Hgb A1c MFr Bld 5.7 (H) 4.8 - 5.6 %   Est. average glucose Bld gHb Est-mCnc 117 mg/dL      Assessment & Plan:   Problem List Items Addressed This Visit       Cardiovascular and Mediastinum   Aortic atherosclerosis (HCC)   Chronic.  Noted on imaging 10/30/20.  Continue  statin daily and ASA for prevention.      Relevant Medications   lisinopril (ZESTRIL) 10 MG tablet   Essential hypertension, benign   Chronic, stable.  BP above goal on all checks today and at home.  Increase Lisinopril to 10 MG daily, she has 5 MG tablets at home and will start taking two a day.  Continue daily BP checks at home and focus on DASH diet regimen.  Discussed strict goal BP <130/80 due to history of stroke.  Labs today: CMP, urine ALB.  Return in 4 weeks.      Relevant Medications   lisinopril (ZESTRIL) 10 MG tablet   Other Relevant Orders   Comprehensive metabolic panel   Microalbumin, Urine Waived     Respiratory   Asthma   Chronic, ongoing.  Continue current medication regimen and adjust as needed.  Consider spirometry next visit.        Obstructive sleep apnea   Chronic, stable.  Recommend use of CPAP 100% of the time, has benefit from this.        Endocrine   IFG (impaired fasting glucose)   Ongoing.  Recent A1c 5.7%.  Check A1c today and treat as needed.  Continue focus on healthy diet and regular activity.      Relevant Orders   Microalbumin, Urine Waived   HgB A1c     Other   Heart murmur   Systolic 2/6.  No symptoms.  Consider echo in future.  History of stroke   Has improved greatly since event in March 2020.  Continue focus on prevention with BP <130/80, A1c <6.5%, and LDL <55.  Lipid panel today.      Lymphedema   Bilateral lower legs with mild edema.  Recommend use of compression socks, but she reports these are uncomfortable and has tried them multiple times.  Elevation of legs often.  May need vascular referral in future to discuss pumps.      Mixed hyperlipidemia   Chronic, stable.  Continue current medication regimen and adjust as needed.  Recent LDL at goal, well below neuro goal <70 -- with new guidelines may benefit LDL <55.  Lipid panel today.      Relevant Medications   lisinopril (ZESTRIL) 10 MG tablet   Other Relevant  Orders   Comprehensive metabolic panel   Lipid Panel w/o Chol/HDL Ratio   Morbid obesity (HCC) - Primary   BMI 43.38 with HTN/HLD, history of CVA.  Recommended eating smaller high protein, low fat meals more frequently and exercising 30 mins a day 5 times a week with a goal of 10-15lb weight loss in the next 3 months. Patient voiced their understanding and motivation to adhere to these recommendations.       Stress incontinence   Chronic, stable.  Continue daily use of pads as needed, consider PT for pelvic floor if worsening.  Recommend focus on Kegel exercises and pelvic floor strengthening.  Will order briefs as needed for her.        Follow up plan: Return in about 4 weeks (around 06/03/2023) for HTN -- increased Lisinopril to 10 MG.

## 2023-05-06 NOTE — Assessment & Plan Note (Signed)
Bilateral lower legs with mild edema.  Recommend use of compression socks, but she reports these are uncomfortable and has tried them multiple times.  Elevation of legs often.  May need vascular referral in future to discuss pumps.

## 2023-05-06 NOTE — Assessment & Plan Note (Signed)
 Chronic, stable.  BP above goal on all checks today and at home.  Increase Lisinopril to 10 MG daily, she has 5 MG tablets at home and will start taking two a day.  Continue daily BP checks at home and focus on DASH diet regimen.  Discussed strict goal BP <130/80 due to history of stroke.  Labs today: CMP, urine ALB.  Return in 4 weeks.

## 2023-05-06 NOTE — Assessment & Plan Note (Signed)
 Ongoing.  Recent A1c 5.7%.  Check A1c today and treat as needed.  Continue focus on healthy diet and regular activity.

## 2023-05-06 NOTE — Assessment & Plan Note (Signed)
Chronic, stable.  Continue current medication regimen and adjust as needed.  Recent LDL at goal, well below neuro goal <70 -- with new guidelines may benefit LDL <55.  Lipid panel today.

## 2023-05-06 NOTE — Assessment & Plan Note (Signed)
Chronic.  Noted on imaging 10/30/20.  Continue statin daily and ASA for prevention.

## 2023-05-06 NOTE — Assessment & Plan Note (Signed)
Systolic 2/6.  No symptoms.  Consider echo in future. 

## 2023-05-06 NOTE — Assessment & Plan Note (Signed)
Chronic, stable.  Recommend use of CPAP 100% of the time, has benefit from this.

## 2023-05-06 NOTE — Assessment & Plan Note (Signed)
 Chronic, stable.  Continue daily use of pads as needed, consider PT for pelvic floor if worsening.  Recommend focus on Kegel exercises and pelvic floor strengthening.  Will order briefs as needed for her.

## 2023-05-06 NOTE — Assessment & Plan Note (Signed)
Chronic, ongoing.  Continue current medication regimen and adjust as needed.  Consider spirometry next visit.

## 2023-05-06 NOTE — Assessment & Plan Note (Signed)
Has improved greatly since event in March 2020.  Continue focus on prevention with BP <130/80, A1c <6.5%, and LDL <55.  Lipid panel today.

## 2023-05-06 NOTE — Assessment & Plan Note (Signed)
 BMI 43.38 with HTN/HLD, history of CVA.  Recommended eating smaller high protein, low fat meals more frequently and exercising 30 mins a day 5 times a week with a goal of 10-15lb weight loss in the next 3 months. Patient voiced their understanding and motivation to adhere to these recommendations.

## 2023-05-07 ENCOUNTER — Telehealth: Payer: Self-pay | Admitting: Nurse Practitioner

## 2023-05-07 ENCOUNTER — Encounter: Payer: Self-pay | Admitting: Nurse Practitioner

## 2023-05-07 LAB — COMPREHENSIVE METABOLIC PANEL
ALT: 20 IU/L (ref 0–32)
AST: 18 IU/L (ref 0–40)
Albumin: 4 g/dL (ref 3.9–4.9)
Alkaline Phosphatase: 137 IU/L — ABNORMAL HIGH (ref 44–121)
BUN/Creatinine Ratio: 19 (ref 12–28)
BUN: 17 mg/dL (ref 8–27)
Bilirubin Total: 0.4 mg/dL (ref 0.0–1.2)
CO2: 23 mmol/L (ref 20–29)
Calcium: 9.8 mg/dL (ref 8.7–10.3)
Chloride: 106 mmol/L (ref 96–106)
Creatinine, Ser: 0.9 mg/dL (ref 0.57–1.00)
Globulin, Total: 2.2 g/dL (ref 1.5–4.5)
Glucose: 108 mg/dL — ABNORMAL HIGH (ref 70–99)
Potassium: 4.6 mmol/L (ref 3.5–5.2)
Sodium: 143 mmol/L (ref 134–144)
Total Protein: 6.2 g/dL (ref 6.0–8.5)
eGFR: 71 mL/min/{1.73_m2} (ref >59)

## 2023-05-07 LAB — LIPID PANEL W/O CHOL/HDL RATIO
Cholesterol, Total: 128 mg/dL (ref 100–199)
HDL: 68 mg/dL (ref >39)
LDL Chol Calc (NIH): 49 mg/dL (ref 0–99)
Triglycerides: 45 mg/dL (ref 0–149)
VLDL Cholesterol Cal: 11 mg/dL (ref 5–40)

## 2023-05-07 LAB — HEMOGLOBIN A1C
Est. average glucose Bld gHb Est-mCnc: 120 mg/dL
Hgb A1c MFr Bld: 5.8 % — ABNORMAL HIGH (ref 4.8–5.6)

## 2023-05-07 NOTE — Telephone Encounter (Signed)
 Patient dropped off document Handicap Placard, to be filled out by provider. Patient requested to send it back via Call Patient to pick up within 5-days. Document is located in providers folder. Please advise at Mobile [phone] (mobile)

## 2023-05-07 NOTE — Progress Notes (Signed)
 Contacted via MyChart   Good afternoon Kristin Solis, your labs have returned: - Kidney function, creatinine and eGFR, remains normal, as is liver function, AST and ALT.  - A1c remains in prediabetic range, but stable at 5.8%. - Lipid panel shows levels at goal.  Any questions? Keep being amazing!!  Thank you for allowing me to participate in your care.  I appreciate you. Kindest regards, Julious Langlois

## 2023-05-10 ENCOUNTER — Telehealth: Payer: Self-pay | Admitting: Nurse Practitioner

## 2023-05-10 NOTE — Telephone Encounter (Signed)
 Form has been given to provider for completion and signature.

## 2023-05-10 NOTE — Telephone Encounter (Signed)
 Copied from CRM [id number]. Topic: General - Other >> May 10, 2023  8:41 AM Patsy Lager T wrote: Reason for CRM: patient called stated she need to get the form for her handicap placard no later than the 31st of March and need someone to call when its ready for pick up  Form has been place in the provider folder for completion.//iap

## 2023-05-10 NOTE — Telephone Encounter (Signed)
 Form completed and signed by Jolene. Called and notified patient that form was ready to be picked up.

## 2023-05-10 NOTE — Telephone Encounter (Signed)
See other phone encounter on this.

## 2023-05-10 NOTE — Telephone Encounter (Unsigned)
 Copied from CRM [id number]. Topic: General - Other >> May 10, 2023  2:55 PM Kristin Solis wrote: Reason for CRM: Patient will be coming in the morning to pick up paperwork.

## 2023-06-09 ENCOUNTER — Ambulatory Visit (INDEPENDENT_AMBULATORY_CARE_PROVIDER_SITE_OTHER): Admitting: Nurse Practitioner

## 2023-06-09 ENCOUNTER — Encounter: Payer: Self-pay | Admitting: Nurse Practitioner

## 2023-06-09 VITALS — BP 118/78 | HR 67 | Temp 98.7°F | Ht 62.5 in | Wt 239.8 lb

## 2023-06-09 DIAGNOSIS — I10 Essential (primary) hypertension: Secondary | ICD-10-CM

## 2023-06-09 DIAGNOSIS — R42 Dizziness and giddiness: Secondary | ICD-10-CM | POA: Diagnosis not present

## 2023-06-09 MED ORDER — MECLIZINE HCL 12.5 MG PO TABS: 12.5000 mg | ORAL_TABLET | Freq: Three times a day (TID) | ORAL | 0 refills | Status: DC | PRN

## 2023-06-09 NOTE — Assessment & Plan Note (Signed)
 Chronic, stable.  BP improved on home checks and in office on recheck today. Continue Lisinopril  10 MG daily, as is offering benefit.  Continue daily BP checks at home and focus on DASH diet regimen.  Discussed strict goal BP <130/80 due to history of stroke.  Labs today: up to date.

## 2023-06-09 NOTE — Patient Instructions (Signed)
 Benign Positional Vertigo Vertigo is the feeling that you or your surroundings are moving when they are not. Benign positional vertigo is the most common form of vertigo. This is usually a harmless condition (benign). This condition is positional. This means that symptoms are triggered by certain movements and positions. This condition can be dangerous if it occurs while you are doing something that could cause harm to yourself or others. This includes activities such as driving or operating machinery. What are the causes? The inner ear has fluid-filled canals that help your brain sense movement and balance. When the fluid moves, the brain receives messages about your body's position. With benign positional vertigo, calcium crystals in the inner ear break free and disturb the inner ear area. This causes your brain to receive confusing messages about your body's position. What increases the risk? You are more likely to develop this condition if: You are a woman. You are 64 years of age or older. You have recently had a head injury. You have an inner ear disease. What are the signs or symptoms? Symptoms of this condition usually happen when you move your head or your eyes in different directions. Symptoms may start suddenly and usually last for less than a minute. They include: Loss of balance and falling. Feeling like you are spinning or moving. Feeling like your surroundings are spinning or moving. Nausea and vomiting. Blurred vision. Dizziness. Involuntary eye movement (nystagmus). Symptoms can be mild and cause only minor problems, or they can be severe and interfere with daily life. Episodes of benign positional vertigo may return (recur) over time. Symptoms may also improve over time. How is this diagnosed? This condition may be diagnosed based on: Your medical history. A physical exam of the head, neck, and ears. Positional tests to check for or stimulate vertigo. You may be asked to  turn your head and change positions, such as going from sitting to lying down. A health care provider will watch for symptoms of vertigo. You may be referred to a health care provider who specializes in ear, nose, and throat problems (ENT or otolaryngologist) or a provider who specializes in disorders of the nervous system (neurologist). How is this treated?  This condition may be treated in a session in which your health care provider moves your head in specific positions to help the displaced crystals in your inner ear move. Treatment for this condition may take several sessions. Surgery may be needed in severe cases, but this is rare. In some cases, benign positional vertigo may resolve on its own in 2-4 weeks. Follow these instructions at home: Safety Move slowly. Avoid sudden body or head movements or certain positions, as told by your health care provider. Avoid driving or operating machinery until your health care provider says it is safe. Avoid doing any tasks that would be dangerous to you or others if vertigo occurs. If you have trouble walking or keeping your balance, try using a cane for stability. If you feel dizzy or unstable, sit down right away. Return to your normal activities as told by your health care provider. Ask your health care provider what activities are safe for you. General instructions Take over-the-counter and prescription medicines only as told by your health care provider. Drink enough fluid to keep your urine pale yellow. Keep all follow-up visits. This is important. Contact a health care provider if: You have a fever. Your condition gets worse or you develop new symptoms. Your family or friends notice any behavioral changes.  You have nausea or vomiting that gets worse. You have numbness or a prickling and tingling sensation. Get help right away if you: Have difficulty speaking or moving. Are always dizzy or faint. Develop severe headaches. Have weakness in  your legs or arms. Have changes in your hearing or vision. Develop a stiff neck. Develop sensitivity to light. These symptoms may represent a serious problem that is an emergency. Do not wait to see if the symptoms will go away. Get medical help right away. Call your local emergency services (911 in the [place] Do not drive yourself to the hospital. Summary Vertigo is the feeling that you or your surroundings are moving when they are not. Benign positional vertigo is the most common form of vertigo. This condition is caused by calcium crystals in the inner ear that become displaced. This causes a disturbance in an area of the inner ear that helps your brain sense movement and balance. Symptoms include loss of balance and falling, feeling that you or your surroundings are moving, nausea and vomiting, and blurred vision. This condition can be diagnosed based on symptoms, a physical exam, and positional tests. Follow safety instructions as told by your health care provider and keep all follow-up visits. This is important. This information is not intended to replace advice given to you by your health care provider. Make sure you discuss any questions you have with your health care provider. Document Revised: 08/24/2022 Document Reviewed: 08/24/2022 Elsevier Patient Education  2024 [employer].

## 2023-06-09 NOTE — Progress Notes (Signed)
 BP 118/78 (BP Location: Left Arm, Patient Position: Sitting, Cuff Size: Large)   Pulse 67   Temp 98.7 F (37.1 C) (Oral)   Ht 5' 2.5" (1.588 m)   Wt 239 lb 12.8 oz (108.8 kg)   SpO2 98%   BMI 43.16 kg/m    Subjective:    Patient ID: Kristin Solis, female    DOB: April 03, 1955, 68 y.o.   MRN: [id number]  HPI: Kristin Solis is a 68 y.o. female  Chief Complaint  Patient presents with   Hypertension   HYPERTENSION without Chronic Kidney Disease Follow-up today for increasing Lisinopril  on 05/06/23 to 10 MG.  She is tolerating the change.  History of CVA. BP's are trending down. Hypertension status: stable  Satisfied with current treatment? yes Duration of hypertension: chronic BP monitoring frequency:  daily BP range: 101/55 to 151/86 -- are on average now 110/70 range BP medication side effects:  no Medication compliance: good compliance Previous BP meds: as above Aspirin : yes Recurrent headaches: no Visual changes: no Palpitations: no Dyspnea: no Chest pain: no Lower extremity edema: no Dizzy/lightheaded: yes -- had one yesterday in the shower  DIZZINESS Continues to have dizzy spells, these come at random, sometimes while in bed. Has history of vertigo in past. Duration: months Description of symptoms: room spinning Duration of episode: seconds Dizziness frequency: has them 1-2 times a week and sometimes none during week Provoking factors: none Aggravating factors:  none Triggered by rolling over in bed: no Triggered by bending over: yes Aggravated by head movement: no Aggravated by exertion, coughing, loud noises: no Recent head injury: no Recent or current viral symptoms: no History of vasovagal episodes: no Nausea: no Vomiting: no Tinnitus: yes at baseline for years Hearing loss: no Aural fullness: no Headache: no Photophobia/phonophobia: no Unsteady gait: yes when dizziness present Postural instability: no Diplopia, dysarthria, dysphagia or  weakness: no Related to exertion: no Pallor: no Diaphoresis: no Dyspnea: no Chest pain: no   Relevant past medical, surgical, family and social history reviewed and updated as indicated. Interim medical history since our last visit reviewed. Allergies and medications reviewed and updated.  Review of Systems  Constitutional:  Negative for activity change, appetite change, diaphoresis, fatigue and fever.  Respiratory:  Negative for cough, chest tightness and shortness of breath.   Cardiovascular:  Negative for chest pain, palpitations and leg swelling.  Gastrointestinal: Negative.   Neurological:  Positive for dizziness. Negative for syncope, facial asymmetry, speech difficulty, weakness, light-headedness, numbness and headaches.  Psychiatric/Behavioral: Negative.      Per HPI unless specifically indicated above     Objective:    BP 118/78 (BP Location: Left Arm, Patient Position: Sitting, Cuff Size: Large)   Pulse 67   Temp 98.7 F (37.1 C) (Oral)   Ht 5' 2.5" (1.588 m)   Wt 239 lb 12.8 oz (108.8 kg)   SpO2 98%   BMI 43.16 kg/m   Wt Readings from Last 3 Encounters:  06/09/23 239 lb 12.8 oz (108.8 kg)  05/06/23 241 lb (109.3 kg)  12/03/22 238 lb 3.2 oz (108 kg)    Physical Exam Vitals and nursing note reviewed.  Constitutional:      General: She is awake. She is not in acute distress.    Appearance: She is well-developed and well-groomed. She is obese. She is not ill-appearing or toxic-appearing.  HENT:     Head: Normocephalic.     Right Ear: Hearing, ear canal and external ear normal. No middle ear  effusion. There is no impacted cerumen. Tympanic membrane is not injected.     Left Ear: Hearing, ear canal and external ear normal.  No middle ear effusion. There is no impacted cerumen. Tympanic membrane is not injected.     Nose: No rhinorrhea.     Right Sinus: No maxillary sinus tenderness or frontal sinus tenderness.     Left Sinus: No maxillary sinus tenderness or  frontal sinus tenderness.     Mouth/Throat:     Mouth: Mucous membranes are moist.  Eyes:     General: Lids are normal.        Right eye: No discharge.        Left eye: No discharge.     Extraocular Movements: Extraocular movements intact.     Right eye: No nystagmus.     Left eye: No nystagmus.     Conjunctiva/sclera: Conjunctivae normal.     Pupils: Pupils are equal, round, and reactive to light.  Neck:     Thyroid : No thyromegaly.     Vascular: No carotid bruit.  Cardiovascular:     Rate and Rhythm: Normal rate and regular rhythm.     Heart sounds: Murmur heard.     Systolic murmur is present with a grade of 2/6.     No gallop.  Pulmonary:     Effort: Pulmonary effort is normal. No accessory muscle usage or respiratory distress.     Breath sounds: Normal breath sounds.  Abdominal:     General: Bowel sounds are normal. There is no distension.     Palpations: Abdomen is soft.     Tenderness: There is no abdominal tenderness.  Musculoskeletal:     Cervical back: Normal range of motion and neck supple.     Right lower leg: No edema.     Left lower leg: No edema.  Lymphadenopathy:     Cervical: No cervical adenopathy.  Skin:    General: Skin is warm and dry.  Neurological:     Mental Status: She is alert and oriented to person, place, and time.     Cranial Nerves: Cranial nerves 2-12 are intact.     Motor: Motor function is intact.     Coordination: Coordination is intact.     Gait: Gait is intact.     Deep Tendon Reflexes: Reflexes are normal and symmetric.     Reflex Scores:      Brachioradialis reflexes are 2+ on the right side and 2+ on the left side.      Patellar reflexes are 2+ on the right side and 2+ on the left side.    Comments: Able to replicate dizziness with lying down and quick movement of head to the side bilaterally.  No nystagmus.    Psychiatric:        Attention and Perception: Attention normal.        Mood and Affect: Mood normal.        Speech:  Speech normal.        Behavior: Behavior normal. Behavior is cooperative.        Thought Content: Thought content normal.    Results for orders placed or performed in visit on 05/06/23  Microalbumin, Urine Waived   Collection Time: 05/06/23  9:53 AM  Result Value Ref Range   Microalb, Ur Waived 30 (H) 0 - 19 mg/L   Creatinine, Urine Waived 300 10 - 300 mg/dL   Microalb/Creat Ratio <30 <30 mg/g  Comprehensive metabolic panel   Collection  Time: 05/06/23  9:54 AM  Result Value Ref Range   Glucose 108 (H) 70 - 99 mg/dL   BUN 17 8 - 27 mg/dL   Creatinine, Ser 0.98 0.57 - 1.00 mg/dL   eGFR 71 >11 BJ/YNW/2.95   BUN/Creatinine Ratio 19 12 - 28   Sodium 143 134 - 144 mmol/L   Potassium 4.6 3.5 - 5.2 mmol/L   Chloride 106 96 - 106 mmol/L   CO2 23 20 - 29 mmol/L   Calcium  9.8 8.7 - 10.3 mg/dL   Total Protein 6.2 6.0 - 8.5 g/dL   Albumin 4.0 3.9 - 4.9 g/dL   Globulin, Total 2.2 1.5 - 4.5 g/dL   Bilirubin Total 0.4 0.0 - 1.2 mg/dL   Alkaline Phosphatase 137 (H) 44 - 121 IU/L   AST 18 0 - 40 IU/L   ALT 20 0 - 32 IU/L  Lipid Panel w/o Chol/HDL Ratio   Collection Time: 05/06/23  9:54 AM  Result Value Ref Range   Cholesterol, Total 128 100 - 199 mg/dL   Triglycerides 45 0 - 149 mg/dL   HDL 68 >62 mg/dL   VLDL Cholesterol Cal 11 5 - 40 mg/dL   LDL Chol Calc (NIH) 49 0 - 99 mg/dL  HgB Z3Y   Collection Time: 05/06/23  9:54 AM  Result Value Ref Range   Hgb A1c MFr Bld 5.8 (H) 4.8 - 5.6 %   Est. average glucose Bld gHb Est-mCnc 120 mg/dL      Assessment & Plan:   Problem List Items Addressed This Visit       Cardiovascular and Mediastinum   Essential hypertension, benign - Primary   Chronic, stable.  BP improved on home checks and in office on recheck today. Continue Lisinopril  10 MG daily, as is offering benefit.  Continue daily BP checks at home and focus on DASH diet regimen.  Discussed strict goal BP <130/80 due to history of stroke.  Labs today: up to date.        Other    Dizziness   History of similar in past.  Suspect BPPV based on exam.  No red flags on exam.  Provided her with at home Epley maneuver instructions to perform and educated her on this.  Send in Meclizine  to take as needed for episodes, educated on this regimen and side effects.  If ongoing issues will send to ENT or PT.        Follow up plan: Return in about 4 weeks (around 07/07/2023) for Dizziness + needs physical after 12/04/23.

## 2023-06-09 NOTE — Assessment & Plan Note (Addendum)
 History of similar in past.  Suspect BPPV based on exam.  No red flags on exam.  Provided her with at home Epley maneuver instructions to perform and educated her on this.  Send in Meclizine  to take as needed for episodes, educated on this regimen and side effects.  If ongoing issues will send to ENT or PT.

## 2023-06-10 ENCOUNTER — Other Ambulatory Visit: Payer: Self-pay | Admitting: Nurse Practitioner

## 2023-06-11 NOTE — Telephone Encounter (Signed)
 Requested Prescriptions  Pending Prescriptions Disp Refills   fluticasone  (FLONASE ) 50 MCG/ACT nasal spray [Pharmacy Med Name: Fluticasone  Propionate 50 MCG/ACT Nasal Suspension] 16 g 0    Sig: Use 2 spray(s) in each nostril once daily     Ear, Nose, and Throat: Nasal Preparations - Corticosteroids Passed - 06/11/2023  9:53 AM      Passed - Valid encounter within last 12 months    Recent Outpatient Visits           2 days ago Essential hypertension, benign   [facility] [place], Sanjuan Crumbly T, NP   1 month ago Morbid obesity (HCC)   [facility] [place], Lavelle Posey, NP       Future Appointments             In 6 months [name], [name], NP [facility]

## 2023-07-01 DIAGNOSIS — G4733 Obstructive sleep apnea (adult) (pediatric): Secondary | ICD-10-CM | POA: Diagnosis not present

## 2023-07-04 NOTE — Patient Instructions (Signed)
 Be Involved in Caring For Your Health:  Taking Medications When medications are taken as directed, they can greatly improve your health. But if they are not taken as prescribed, they may not work. In some cases, not taking them correctly can be harmful. To help ensure your treatment remains effective and safe, understand your medications and how to take them. Bring your medications to each visit for review by your provider.  Your lab results, notes, and after visit summary will be available on My Chart. We strongly encourage you to use this feature. If lab results are abnormal the clinic will contact you with the appropriate steps. If the clinic does not contact you assume the results are satisfactory. You can always view your results on My Chart. If you have questions regarding your health or results, please contact the clinic during office hours. You can also ask questions on My Chart.  We at [facility] are grateful that you chose [place] to provide your care. We strive to provide evidence-based and compassionate care and are always looking for feedback. If you get a survey from the clinic please complete this so we can hear your opinions.  Dizziness Dizziness is a common problem. It makes you feel unsteady or light-headed. You may feel like you're about to faint. Dizziness can lead to getting hurt if you stumble or fall. It's more common to feel dizzy if you're an older adult. Many things can cause you to feel dizzy. These include: Medicines. Dehydration. This is when there's not enough water in your body. Illness. Follow these instructions at home: Eating and drinking  Drink enough fluid to keep your pee (urine) pale yellow. This helps keep you from getting dehydrated. Try to drink more clear fluids, such as water. Do not drink alcohol. Try to limit how much caffeine you take in. Try to limit how much salt, also called sodium, you take in. Activity Try not to make quick  movements. Stand up slowly from sitting in a chair. Steady yourself until you feel okay. In the morning, first sit up on the side of the bed. When you feel okay, hold onto something and slowly stand up. Do this until you know that your balance is okay. If you need to stand in one place for a long time, move your legs often. Tighten and relax the muscles in your legs while you're standing. Do not drive or use machines if you feel dizzy. Avoid bending down if you feel dizzy. Place items in your home so you can reach them without leaning over. Lifestyle Do not smoke, vape, or use products with nicotine or tobacco in them. If you need help quitting, talk with your health care provider. Try to lower your stress level. You can do this by using methods like yoga or meditation. Talk with your provider if you need help. General instructions Watch your dizziness for any changes. Take your medicines only as told by your provider. Talk with your provider if you think you're dizzy because of a medicine you're taking. Tell a friend or a family member that you're feeling dizzy. If they spot any changes in your behavior, have them call your provider. Contact a health care provider if: Your dizziness doesn't go away, or you have new symptoms. Your dizziness gets worse. You feel like you may vomit. You have trouble hearing. You have a fever. You have neck pain or a stiff neck. You fall or get hurt. Get help right away if: You vomit  each time you eat or drink. You have watery poop and can't eat or drink. You have trouble talking, walking, swallowing, or using your arms, hands, or legs. You feel very weak. You're bleeding. You're not thinking clearly, or you have trouble forming sentences. A friend or family member may spot this. Your vision changes, or you get a very bad headache. These symptoms may be an emergency. Call 911 right away. Do not wait to see if the symptoms will go away. Do not drive  yourself to the hospital. This information is not intended to replace advice given to you by your health care provider. Make sure you discuss any questions you have with your health care provider. Document Revised: 11/05/2022 Document Reviewed: 03/19/2022 Elsevier Patient Education  2024 [employer].

## 2023-07-07 ENCOUNTER — Ambulatory Visit (INDEPENDENT_AMBULATORY_CARE_PROVIDER_SITE_OTHER): Admitting: Nurse Practitioner

## 2023-07-07 ENCOUNTER — Encounter: Payer: Self-pay | Admitting: Nurse Practitioner

## 2023-07-07 VITALS — BP 128/78 | HR 73 | Temp 98.0°F | Ht 62.5 in | Wt 240.4 lb

## 2023-07-07 DIAGNOSIS — R42 Dizziness and giddiness: Secondary | ICD-10-CM

## 2023-07-07 DIAGNOSIS — I10 Essential (primary) hypertension: Secondary | ICD-10-CM

## 2023-07-07 NOTE — Progress Notes (Signed)
 BP 128/78 (BP Location: Left Arm, Patient Position: Sitting, Cuff Size: Large)   Pulse 73   Temp 98 F (36.7 C) (Oral)   Ht 5' 2.5" (1.588 m)   Wt 240 lb 6.4 oz (109 kg)   SpO2 96%   BMI 43.27 kg/m    Subjective:    Patient ID: Kristin Solis, female    DOB: 23-Jul-1955, 68 y.o.   MRN: [id number]  HPI: Kristin Solis is a 68 y.o. female  Chief Complaint  Patient presents with   Dizziness    4 week f/up- patient states she is not experiencing the dizziness anymore    HYPERTENSION without Chronic Kidney Disease TakesLisinopril on 05/06/23 to 10 MG.  She is tolerating the change.  History of CVA. No dizziness present anymore, history of similar issues in past. Hypertension status: stable  Satisfied with current treatment? yes Duration of hypertension: chronic BP monitoring frequency:  daily BP range: average now 110/70 range BP medication side effects:  no Medication compliance: good compliance Previous BP meds: as above Aspirin : yes Recurrent headaches: no Visual changes: no Palpitations: no Dyspnea: no Chest pain: no Lower extremity edema: no Dizzy/lightheaded: improved today  Relevant past medical, surgical, family and social history reviewed and updated as indicated. Interim medical history since our last visit reviewed. Allergies and medications reviewed and updated.  Review of Systems  Constitutional:  Negative for activity change, appetite change, diaphoresis, fatigue and fever.  Respiratory:  Negative for cough, chest tightness and shortness of breath.   Cardiovascular:  Negative for chest pain, palpitations and leg swelling.  Gastrointestinal: Negative.   Neurological:  Positive for dizziness. Negative for syncope, facial asymmetry, speech difficulty, weakness, light-headedness, numbness and headaches.  Psychiatric/Behavioral: Negative.      Per HPI unless specifically indicated above     Objective:    BP 128/78 (BP Location: Left Arm, Patient  Position: Sitting, Cuff Size: Large)   Pulse 73   Temp 98 F (36.7 C) (Oral)   Ht 5' 2.5" (1.588 m)   Wt 240 lb 6.4 oz (109 kg)   SpO2 96%   BMI 43.27 kg/m   Wt Readings from Last 3 Encounters:  07/07/23 240 lb 6.4 oz (109 kg)  06/09/23 239 lb 12.8 oz (108.8 kg)  05/06/23 241 lb (109.3 kg)    Physical Exam Vitals and nursing note reviewed.  Constitutional:      General: She is awake. She is not in acute distress.    Appearance: She is well-developed and well-groomed. She is obese. She is not ill-appearing or toxic-appearing.  HENT:     Head: Normocephalic.     Right Ear: Hearing, ear canal and external ear normal. No middle ear effusion. There is no impacted cerumen. Tympanic membrane is not injected.     Left Ear: Hearing, ear canal and external ear normal.  No middle ear effusion. There is no impacted cerumen. Tympanic membrane is not injected.     Nose: No rhinorrhea.     Right Sinus: No maxillary sinus tenderness or frontal sinus tenderness.     Left Sinus: No maxillary sinus tenderness or frontal sinus tenderness.     Mouth/Throat:     Mouth: Mucous membranes are moist.  Eyes:     General: Lids are normal.        Right eye: No discharge.        Left eye: No discharge.     Extraocular Movements: Extraocular movements intact.     Right  eye: No nystagmus.     Left eye: No nystagmus.     Conjunctiva/sclera: Conjunctivae normal.     Pupils: Pupils are equal, round, and reactive to light.  Neck:     Thyroid : No thyromegaly.     Vascular: No carotid bruit.  Cardiovascular:     Rate and Rhythm: Normal rate and regular rhythm.     Heart sounds: Murmur heard.     Systolic murmur is present with a grade of 2/6.     No gallop.  Pulmonary:     Effort: Pulmonary effort is normal. No accessory muscle usage or respiratory distress.     Breath sounds: Normal breath sounds.  Abdominal:     General: Bowel sounds are normal. There is no distension.     Palpations: Abdomen is soft.      Tenderness: There is no abdominal tenderness.  Musculoskeletal:     Cervical back: Normal range of motion and neck supple.     Right lower leg: No edema.     Left lower leg: No edema.  Lymphadenopathy:     Cervical: No cervical adenopathy.  Skin:    General: Skin is warm and dry.  Neurological:     Mental Status: She is alert and oriented to person, place, and time.     Cranial Nerves: Cranial nerves 2-12 are intact.     Motor: Motor function is intact.     Coordination: Coordination is intact.     Gait: Gait is intact.     Deep Tendon Reflexes: Reflexes are normal and symmetric.     Reflex Scores:      Brachioradialis reflexes are 2+ on the right side and 2+ on the left side.      Patellar reflexes are 2+ on the right side and 2+ on the left side. Psychiatric:        Attention and Perception: Attention normal.        Mood and Affect: Mood normal.        Speech: Speech normal.        Behavior: Behavior normal. Behavior is cooperative.        Thought Content: Thought content normal.    Results for orders placed or performed in visit on 05/06/23  Microalbumin, Urine Waived   Collection Time: 05/06/23  9:53 AM  Result Value Ref Range   Microalb, Ur Waived 30 (H) 0 - 19 mg/L   Creatinine, Urine Waived 300 10 - 300 mg/dL   Microalb/Creat Ratio <30 <30 mg/g  Comprehensive metabolic panel   Collection Time: 05/06/23  9:54 AM  Result Value Ref Range   Glucose 108 (H) 70 - 99 mg/dL   BUN 17 8 - 27 mg/dL   Creatinine, Ser 1.61 0.57 - 1.00 mg/dL   eGFR 71 >09 UE/AVW/0.98   BUN/Creatinine Ratio 19 12 - 28   Sodium 143 134 - 144 mmol/L   Potassium 4.6 3.5 - 5.2 mmol/L   Chloride 106 96 - 106 mmol/L   CO2 23 20 - 29 mmol/L   Calcium  9.8 8.7 - 10.3 mg/dL   Total Protein 6.2 6.0 - 8.5 g/dL   Albumin 4.0 3.9 - 4.9 g/dL   Globulin, Total 2.2 1.5 - 4.5 g/dL   Bilirubin Total 0.4 0.0 - 1.2 mg/dL   Alkaline Phosphatase 137 (H) 44 - 121 IU/L   AST 18 0 - 40 IU/L   ALT 20 0 - 32  IU/L  Lipid Panel w/o Chol/HDL Ratio   Collection  Time: 05/06/23  9:54 AM  Result Value Ref Range   Cholesterol, Total 128 100 - 199 mg/dL   Triglycerides 45 0 - 149 mg/dL   HDL 68 >54 mg/dL   VLDL Cholesterol Cal 11 5 - 40 mg/dL   LDL Chol Calc (NIH) 49 0 - 99 mg/dL  HgB U9W   Collection Time: 05/06/23  9:54 AM  Result Value Ref Range   Hgb A1c MFr Bld 5.8 (H) 4.8 - 5.6 %   Est. average glucose Bld gHb Est-mCnc 120 mg/dL      Assessment & Plan:   Problem List Items Addressed This Visit       Cardiovascular and Mediastinum   Essential hypertension, benign - Primary   Chronic, stable.  BP stable on home checks, initial elevated here but recheck at goal.  Some white coat is present. Continue Lisinopril  10 MG daily, as is offering benefit.  Continue daily BP checks at home and focus on DASH diet regimen.  Discussed strict goal BP <130/80 due to history of stroke.  Labs today: up to date.        Other   Dizziness   Acute and improved with home Epley maneuvers done. Suspect BPPV based on exam.  If ongoing issues will send to ENT or PT.         Follow up plan: Return for as scheduled October 23rd.

## 2023-07-07 NOTE — Assessment & Plan Note (Signed)
 Chronic, stable.  BP stable on home checks, initial elevated here but recheck at goal.  Some white coat is present. Continue Lisinopril  10 MG daily, as is offering benefit.  Continue daily BP checks at home and focus on DASH diet regimen.  Discussed strict goal BP <130/80 due to history of stroke.  Labs today: up to date.

## 2023-07-07 NOTE — Assessment & Plan Note (Signed)
 Acute and improved with home Epley maneuvers done. Suspect BPPV based on exam.  If ongoing issues will send to ENT or PT.

## 2023-07-15 ENCOUNTER — Other Ambulatory Visit: Payer: Self-pay | Admitting: Nurse Practitioner

## 2023-07-16 NOTE — Telephone Encounter (Signed)
 Requested Prescriptions  Pending Prescriptions Disp Refills   fluticasone  (FLONASE ) 50 MCG/ACT nasal spray [Pharmacy Med Name: Fluticasone  Propionate 50 MCG/ACT Nasal Suspension] 16 g 3    Sig: Use 2 spray(s) in each nostril once daily     Ear, Nose, and Throat: Nasal Preparations - Corticosteroids Passed - 07/16/2023  9:33 PM      Passed - Valid encounter within last 12 months    Recent Outpatient Visits           1 week ago Essential hypertension, benign   [place] [facility] [place], [place] T, NP   1 month ago Essential hypertension, benign   [place] [facility] [place], Allen T, NP   2 months ago Morbid obesity (HCC)   [place] [facility] [place], Lavelle Posey, NP       Future Appointments             In 4 months [name], [name], NP [place] [facility], [facility]

## 2023-08-25 DIAGNOSIS — H40013 Open angle with borderline findings, low risk, bilateral: Secondary | ICD-10-CM | POA: Diagnosis not present

## 2023-08-25 DIAGNOSIS — H2513 Age-related nuclear cataract, bilateral: Secondary | ICD-10-CM | POA: Diagnosis not present

## 2023-08-25 DIAGNOSIS — H5203 Hypermetropia, bilateral: Secondary | ICD-10-CM | POA: Diagnosis not present

## 2023-08-25 DIAGNOSIS — H04123 Dry eye syndrome of bilateral lacrimal glands: Secondary | ICD-10-CM | POA: Diagnosis not present

## 2023-09-01 ENCOUNTER — Other Ambulatory Visit: Payer: Self-pay | Admitting: Nurse Practitioner

## 2023-09-01 DIAGNOSIS — Z1231 Encounter for screening mammogram for malignant neoplasm of breast: Secondary | ICD-10-CM

## 2023-09-08 ENCOUNTER — Other Ambulatory Visit: Payer: Self-pay | Admitting: Nurse Practitioner

## 2023-09-08 NOTE — Telephone Encounter (Unsigned)
 Copied from CRM [id number]. Topic: Clinical - Medication Refill >> Sep 08, 2023  3:55 PM Ivette P wrote: Medication: lisinopril  (ZESTRIL ) 10 MG tablet  Has the patient contacted their pharmacy? Yes (Agent: If no, request that the patient contact the pharmacy for the refill. If patient does not wish to contact the pharmacy document the reason why and proceed with request.) (Agent: If yes, when and what did the pharmacy advise?)  This is the patient's preferred pharmacy:  [facility] [address]), [place] - [address] [place]) [place] [address] Phone: [phone] Fax: [phone]  Is this the correct pharmacy for this prescription? Yes If no, delete pharmacy and type the correct one.   Has the prescription been filled recently? No  Is the patient out of the medication? Yes, pt has 1 left  Has the patient been seen for an appointment in the last year OR does the patient have an upcoming appointment? Yes  Can we respond through MyChart? Yes  Agent: Please be advised that Rx refills may take up to 3 business days. We ask that you follow-up with your pharmacy.

## 2023-09-10 MED ORDER — LISINOPRIL 10 MG PO TABS: 10.0000 mg | ORAL_TABLET | Freq: Every day | ORAL | 0 refills | Status: DC

## 2023-09-10 NOTE — Telephone Encounter (Signed)
 Requested medications are due for refill today.  unsure  Requested medications are on the active medications list.  yes  Last refill. 05/06/2023 unknown quantity  Future visit scheduled.   yes  Notes to clinic.  Medication is historical.    Requested Prescriptions  Pending Prescriptions Disp Refills   lisinopril  (ZESTRIL ) 10 MG tablet      Sig: Take 1 tablet (10 mg total) by mouth daily.     Cardiovascular:  ACE Inhibitors Passed - 09/10/2023 10:28 AM      Passed - Cr in normal range and within 180 days    Creatinine  Date Value Ref Range Status  07/14/2012 0.94 0.60 - 1.30 mg/dL Final   Creatinine, Ser  Date Value Ref Range Status  05/06/2023 0.90 0.57 - 1.00 mg/dL Final         Passed - K in normal range and within 180 days    Potassium  Date Value Ref Range Status  05/06/2023 4.6 3.5 - 5.2 mmol/L Final  07/14/2012 3.5 3.5 - 5.1 mmol/L Final         Passed - Patient is not pregnant      Passed - Last BP in normal range    BP Readings from Last 1 Encounters:  07/07/23 128/78         Passed - Valid encounter within last 6 months    Recent Outpatient Visits           2 months ago Essential hypertension, benign   [place] [facility] [place], Melanie T, NP   3 months ago Essential hypertension, benign   [place] [facility] [place], Melanie T, NP   4 months ago Morbid obesity (HCC)   [place] [facility] [place], Melanie DASEN, NP       Future Appointments             In 3 months [name], [name], NP  [facility], [facility]

## 2023-10-08 DIAGNOSIS — G4733 Obstructive sleep apnea (adult) (pediatric): Secondary | ICD-10-CM | POA: Diagnosis not present

## 2023-10-08 DIAGNOSIS — G4701 Insomnia due to medical condition: Secondary | ICD-10-CM | POA: Diagnosis not present

## 2023-10-20 ENCOUNTER — Ambulatory Visit
Admission: RE | Admit: 2023-10-20 | Discharge: 2023-10-20 | Disposition: A | Discharge status: Home / Self Care | Source: Ambulatory Visit | Attending: Nurse Practitioner | Admitting: Nurse Practitioner

## 2023-10-20 DIAGNOSIS — Z1231 Encounter for screening mammogram for malignant neoplasm of breast: Secondary | ICD-10-CM | POA: Diagnosis not present

## 2023-10-25 ENCOUNTER — Ambulatory Visit: Payer: Self-pay | Admitting: Nurse Practitioner

## 2023-10-25 NOTE — Progress Notes (Signed)
 Contacted via MyChart   Normal mammogram, may repeat in one year:)

## 2023-11-17 NOTE — Progress Notes (Signed)
 Kristin Solis                                          MRN: [id number]   11/17/2023   The [facility] Quality Team Specialist reviewed this patient medical record for the purposes of chart review for care gap closure. The following were reviewed: chart review for care gap closure-colorectal cancer screening.    VBCI Quality Team

## 2023-12-05 ENCOUNTER — Other Ambulatory Visit: Payer: Self-pay | Admitting: Family Medicine

## 2023-12-06 NOTE — Patient Instructions (Signed)
 Be Involved in Caring For Your Health:  Taking Medications When medications are taken as directed, they can greatly improve your health. But if they are not taken as prescribed, they may not work. In some cases, not taking them correctly can be harmful. To help ensure your treatment remains effective and safe, understand your medications and how to take them. Bring your medications to each visit for review by your provider.  Your lab results, notes, and after visit summary will be available on My Chart. We strongly encourage you to use this feature. If lab results are abnormal the clinic will contact you with the appropriate steps. If the clinic does not contact you assume the results are satisfactory. You can always view your results on My Chart. If you have questions regarding your health or results, please contact the clinic during office hours. You can also ask questions on My Chart.  We at [facility] are grateful that you chose [place] to provide your care. We strive to provide evidence-based and compassionate care and are always looking for feedback. If you get a survey from the clinic please complete this so we can hear your opinions.  Heart-Healthy Eating Plan Many factors influence your heart health, including eating and exercise habits. Heart health is also called coronary health. Coronary risk increases with abnormal blood fat (lipid) levels. A heart-healthy eating plan includes limiting unhealthy fats, increasing healthy fats, limiting salt (sodium) intake, and making other diet and lifestyle changes. What is my plan? Your health care provider may recommend that: You limit your fat intake to _________% or less of your total calories each day. You limit your saturated fat intake to _________% or less of your total calories each day. You limit the amount of cholesterol in your diet to less than _________ mg per day. You limit the amount of sodium in your diet to less than _________  mg per day. What are tips for following this plan? Cooking Cook foods using methods other than frying. Baking, boiling, grilling, and broiling are all good options. Other ways to reduce fat include: Removing the skin from poultry. Removing all visible fats from meats. Steaming vegetables in water or broth. Meal planning  At meals, imagine dividing your plate into fourths: Fill one-half of your plate with vegetables and green salads. Fill one-fourth of your plate with whole grains. Fill one-fourth of your plate with lean protein foods. Eat 2-4 cups of vegetables per day. One cup of vegetables equals 1 cup (91 g) broccoli or cauliflower florets, 2 medium carrots, 1 large bell pepper, 1 large sweet potato, 1 large tomato, 1 medium white potato, 2 cups (150 g) raw leafy greens. Eat 1-2 cups of fruit per day. One cup of fruit equals 1 small apple, 1 large banana, 1 cup (237 g) mixed fruit, 1 large orange,  cup (82 g) dried fruit, 1 cup (240 mL) 100% fruit juice. Eat more foods that contain soluble fiber. Examples include apples, broccoli, carrots, beans, peas, and barley. Aim to get 25-30 g of fiber per day. Increase your consumption of legumes, nuts, and seeds to 4-5 servings per week. One serving of dried beans or legumes equals  cup (90 g) cooked, 1 serving of nuts is  oz (12 almonds, 24 pistachios, or 7 walnut halves), and 1 serving of seeds equals  oz (8 g). Fats Choose healthy fats more often. Choose monounsaturated and polyunsaturated fats, such as olive and canola oils, avocado oil, flaxseeds, walnuts, almonds, and seeds. Eat  more omega-3 fats. Choose salmon, mackerel, sardines, tuna, flaxseed oil, and ground flaxseeds. Aim to eat fish at least 2 times each week. Check food labels carefully to identify foods with trans fats or high amounts of saturated fat. Limit saturated fats. These are found in animal products, such as meats, butter, and cream. Plant sources of saturated fats  include palm oil, palm kernel oil, and coconut oil. Avoid foods with partially hydrogenated oils in them. These contain trans fats. Examples are stick margarine, some tub margarines, cookies, crackers, and other baked goods. Avoid fried foods. General information Eat more home-cooked food and less restaurant, buffet, and fast food. Limit or avoid alcohol. Limit foods that are high in added sugar and simple starches such as foods made using white refined flour (white breads, pastries, sweets). Lose weight if you are overweight. Losing just 5-10% of your body weight can help your overall health and prevent diseases such as diabetes and heart disease. Monitor your sodium intake, especially if you have high blood pressure. Talk with your health care provider about your sodium intake. Try to incorporate more vegetarian meals weekly. What foods should I eat? Fruits All fresh, canned (in natural juice), or frozen fruits. Vegetables Fresh or frozen vegetables (raw, steamed, roasted, or grilled). Green salads. Grains Most grains. Choose whole wheat and whole grains most of the time. Rice and pasta, including brown rice and pastas made with whole wheat. Meats and other proteins Lean, well-trimmed beef, veal, pork, and lamb. Chicken and [place] without skin. All fish and shellfish. Wild duck, rabbit, pheasant, and venison. Egg whites or low-cholesterol egg substitutes. Dried beans, peas, lentils, and tofu. Seeds and most nuts. Dairy Low-fat or nonfat cheeses, including ricotta and mozzarella. Skim or 1% milk (liquid, powdered, or evaporated). Buttermilk made with low-fat milk. Nonfat or low-fat yogurt. Fats and oils Non-hydrogenated (trans-free) margarines. Vegetable oils, including soybean, sesame, sunflower, olive, avocado, peanut, safflower, corn, canola, and cottonseed. Salad dressings or mayonnaise made with a vegetable oil. Beverages Water (mineral or sparkling). Coffee and tea. Unsweetened ice  tea. Diet beverages. Sweets and desserts Sherbet, gelatin, and fruit ice. Small amounts of dark chocolate. Limit all sweets and desserts. Seasonings and condiments All seasonings and condiments. The items listed above may not be a complete list of foods and beverages you can eat. Contact a dietitian for more options. What foods should I avoid? Fruits Canned fruit in heavy syrup. Fruit in cream or butter sauce. Fried fruit. Limit coconut. Vegetables Vegetables cooked in cheese, cream, or butter sauce. Fried vegetables. Grains Breads made with saturated or trans fats, oils, or whole milk. Croissants. Sweet rolls. Donuts. High-fat crackers, such as cheese crackers and chips. Meats and other proteins Fatty meats, such as hot dogs, ribs, sausage, bacon, rib-eye roast or steak. High-fat deli meats, such as salami and bologna. Caviar. Domestic duck and goose. Organ meats, such as liver. Dairy Cream, sour cream, cream cheese, and creamed cottage cheese. Whole-milk cheeses. Whole or 2% milk (liquid, evaporated, or condensed). Whole buttermilk. Cream sauce or high-fat cheese sauce. Whole-milk yogurt. Fats and oils Meat fat, or shortening. Cocoa butter, hydrogenated oils, palm oil, coconut oil, palm kernel oil. Solid fats and shortenings, including bacon fat, salt pork, lard, and butter. Nondairy cream substitutes. Salad dressings with cheese or sour cream. Beverages Regular sodas and any drinks with added sugar. Sweets and desserts Frosting. Pudding. Cookies. Cakes. Pies. Milk chocolate or white chocolate. Buttered syrups. Full-fat ice cream or ice cream drinks. The items listed above may  not be a complete list of foods and beverages to avoid. Contact a dietitian for more information. Summary Heart-healthy meal planning includes limiting unhealthy fats, increasing healthy fats, limiting salt (sodium) intake and making other diet and lifestyle changes. Lose weight if you are overweight. Losing just  5-10% of your body weight can help your overall health and prevent diseases such as diabetes and heart disease. Focus on eating a balance of foods, including fruits and vegetables, low-fat or nonfat dairy, lean protein, nuts and legumes, whole grains, and heart-healthy oils and fats. This information is not intended to replace advice given to you by your health care provider. Make sure you discuss any questions you have with your health care provider. Document Revised: 03/10/2021 Document Reviewed: 03/10/2021 Elsevier Patient Education  2024 [employer].

## 2023-12-06 NOTE — Telephone Encounter (Unsigned)
 Copied from CRM #[id number]. Topic: Clinical - Medication Refill >> Dec 06, 2023  4:01 PM Lonell PEDLAR wrote: Medication:  lisinopril  (ZESTRIL ) 10 MG tablet  Has the patient contacted their pharmacy? Yes, states no more refills.  This is the patient's preferred pharmacy:  [facility] [address]), [place] - [address] [place]) [place] [address] Phone: [phone] Fax: [phone]  Is this the correct pharmacy for this prescription? Yes If no, delete pharmacy and type the correct one.   Has the prescription been filled recently? Yes  Is the patient out of the medication? No  Has the patient been seen for an appointment in the last year OR does the patient have an upcoming appointment? Yes  Can we respond through MyChart? Yes  Agent: Please be advised that Rx refills may take up to 3 business days. We ask that you follow-up with your pharmacy.

## 2023-12-07 NOTE — Telephone Encounter (Signed)
 Requested Prescriptions  Pending Prescriptions Disp Refills   lisinopril  (ZESTRIL ) 10 MG tablet [Pharmacy Med Name: Lisinopril  10 MG Oral Tablet] 90 tablet 0    Sig: Take 1 tablet by mouth once daily     Cardiovascular:  ACE Inhibitors Failed - 12/07/2023  2:11 PM      Failed - Cr in normal range and within 180 days    Creatinine  Date Value Ref Range Status  07/14/2012 0.94 0.60 - 1.30 mg/dL Final   Creatinine, Ser  Date Value Ref Range Status  05/06/2023 0.90 0.57 - 1.00 mg/dL Final         Failed - K in normal range and within 180 days    Potassium  Date Value Ref Range Status  05/06/2023 4.6 3.5 - 5.2 mmol/L Final  07/14/2012 3.5 3.5 - 5.1 mmol/L Final         Passed - Patient is not pregnant      Passed - Last BP in normal range    BP Readings from Last 1 Encounters:  07/07/23 128/78         Passed - Valid encounter within last 6 months    Recent Outpatient Visits           5 months ago Essential hypertension, benign   [place] [facility] [place], Melanie T, NP   6 months ago Essential hypertension, benign   [place] [facility] [place], Melanie T, NP   7 months ago Morbid obesity (HCC)   [place] [facility] [place], Melanie DASEN, NP       Future Appointments             In 2 days [name], [name], NP [facility], [address]

## 2023-12-07 NOTE — Telephone Encounter (Signed)
 Requested medication (s) are due for refill today: yes   Requested medication (s) are on the active medication list: yes   Last refill:  09/10/23 #90 0 refills  Future visit scheduled: yes in 2 days   Notes to clinic:  protocol failed last labs 05/06/23 . Do you want to refill Rx before OV?     Requested Prescriptions  Pending Prescriptions Disp Refills   lisinopril  (ZESTRIL ) 10 MG tablet [Pharmacy Med Name: Lisinopril  10 MG Oral Tablet] 90 tablet 0    Sig: Take 1 tablet by mouth once daily     Cardiovascular:  ACE Inhibitors Failed - 12/07/2023  1:59 PM      Failed - Cr in normal range and within 180 days    Creatinine  Date Value Ref Range Status  07/14/2012 0.94 0.60 - 1.30 mg/dL Final   Creatinine, Ser  Date Value Ref Range Status  05/06/2023 0.90 0.57 - 1.00 mg/dL Final         Failed - K in normal range and within 180 days    Potassium  Date Value Ref Range Status  05/06/2023 4.6 3.5 - 5.2 mmol/L Final  07/14/2012 3.5 3.5 - 5.1 mmol/L Final         Passed - Patient is not pregnant      Passed - Last BP in normal range    BP Readings from Last 1 Encounters:  07/07/23 128/78         Passed - Valid encounter within last 6 months    Recent Outpatient Visits           5 months ago Essential hypertension, benign   [place] [facility] [place], Melanie T, NP   6 months ago Essential hypertension, benign   [place] [facility] [place], Melanie T, NP   7 months ago Morbid obesity (HCC)   [place] [facility] [place], Melanie DASEN, NP       Future Appointments             In 2 days [name], [name], NP [place] [facility], [address]

## 2023-12-09 ENCOUNTER — Encounter: Payer: Self-pay | Admitting: Nurse Practitioner

## 2023-12-09 ENCOUNTER — Ambulatory Visit: Admitting: Nurse Practitioner

## 2023-12-09 DIAGNOSIS — I890 Lymphedema, not elsewhere classified: Secondary | ICD-10-CM

## 2023-12-09 DIAGNOSIS — Z8673 Personal history of transient ischemic attack (TIA), and cerebral infarction without residual deficits: Secondary | ICD-10-CM | POA: Diagnosis not present

## 2023-12-09 DIAGNOSIS — E782 Mixed hyperlipidemia: Secondary | ICD-10-CM

## 2023-12-09 DIAGNOSIS — I10 Essential (primary) hypertension: Secondary | ICD-10-CM

## 2023-12-09 DIAGNOSIS — M85851 Other specified disorders of bone density and structure, right thigh: Secondary | ICD-10-CM

## 2023-12-09 DIAGNOSIS — R7301 Impaired fasting glucose: Secondary | ICD-10-CM

## 2023-12-09 DIAGNOSIS — J4540 Moderate persistent asthma, uncomplicated: Secondary | ICD-10-CM | POA: Diagnosis not present

## 2023-12-09 DIAGNOSIS — G4733 Obstructive sleep apnea (adult) (pediatric): Secondary | ICD-10-CM

## 2023-12-09 DIAGNOSIS — Z0000 Encounter for general adult medical examination without abnormal findings: Secondary | ICD-10-CM

## 2023-12-09 DIAGNOSIS — Z23 Encounter for immunization: Secondary | ICD-10-CM | POA: Diagnosis not present

## 2023-12-09 DIAGNOSIS — N393 Stress incontinence (female) (male): Secondary | ICD-10-CM

## 2023-12-09 DIAGNOSIS — R011 Cardiac murmur, unspecified: Secondary | ICD-10-CM

## 2023-12-09 LAB — BAYER DCA HB A1C WAIVED: HB A1C (BAYER DCA - WAIVED): 5.4 % (ref 4.8–5.6)

## 2023-12-09 MED ORDER — LISINOPRIL 10 MG PO TABS: 10.0000 mg | ORAL_TABLET | Freq: Every day | ORAL | 4 refills | Status: AC

## 2023-12-09 MED ORDER — ATORVASTATIN CALCIUM 40 MG PO TABS: 40.0000 mg | ORAL_TABLET | Freq: Every day | ORAL | 4 refills | Status: AC

## 2023-12-09 MED ORDER — FLUTICASONE PROPIONATE 50 MCG/ACT NA SUSP: NASAL | 3 refills | Status: AC

## 2023-12-09 MED ORDER — ALBUTEROL SULFATE HFA 108 (90 BASE) MCG/ACT IN AERS: 2.0000 | INHALATION_SPRAY | Freq: Four times a day (QID) | RESPIRATORY_TRACT | 3 refills | Status: AC | PRN

## 2023-12-09 MED ORDER — LISINOPRIL 10 MG PO TABS: 10.0000 mg | ORAL_TABLET | Freq: Every day | ORAL | 0 refills | Status: DC

## 2023-12-09 MED ORDER — FLUTICASONE-SALMETEROL 250-50 MCG/ACT IN AEPB: 1.0000 | INHALATION_SPRAY | Freq: Two times a day (BID) | RESPIRATORY_TRACT | 4 refills | Status: AC

## 2023-12-09 NOTE — Assessment & Plan Note (Signed)
 Bilateral lower legs.  Recommend use of compression socks, but she reports these are uncomfortable and has tried them multiple times.  Elevation of legs often.  Will work on lymphedema pump for patient, she borrowed a family members and found benefit.

## 2023-12-09 NOTE — Assessment & Plan Note (Signed)
 BMI 43.99 with HTN/HLD, history of CVA.  Recommended eating smaller high protein, low fat meals more frequently and exercising 30 mins a day 5 times a week with a goal of 10-15lb weight loss in the next 3 months. Patient voiced their understanding and motivation to adhere to these recommendations.

## 2023-12-09 NOTE — Assessment & Plan Note (Signed)
 Chronic, stable.  Continue daily use of pads as needed, consider PT for pelvic floor if worsening.  Recommend focus on Kegel exercises and pelvic floor strengthening.  Will assist on getting briefs in future if needed.

## 2023-12-09 NOTE — Assessment & Plan Note (Signed)
 Chronic, stable.  Recommend use of CPAP 100% of the time, has benefit from this. Will see if can get mouth piece for her.

## 2023-12-09 NOTE — Assessment & Plan Note (Addendum)
 Chronic, stable.  Continue current medication regimen and adjust as needed.  Recent LDL at goal, well below neuro goal <70 -- with new guidelines goal LDL <55.  Lipid panel today.

## 2023-12-09 NOTE — Progress Notes (Signed)
 Subjective:   Kristin Solis is a 68 y.o. female who presents for Medicare Annual (Subsequent) preventive examination.  Visit Complete: In person  Patient Medicare AWV questionnaire was completed by the patient on 12/09/2023; I have confirmed that all information answered by patient is correct and no changes since this date.  Cardiac Risk Factors include: advanced age (>53men, >36 women);obesity (BMI >30kg/m2);hypertension     Objective:    Today's Vitals   12/09/23 0910 12/09/23 0937  BP: (!) 159/81 130/82  Pulse: 60   Temp: 97.9 F (36.6 C)   TempSrc: Oral   SpO2: 98%   Weight: 244 lb 6.4 oz (110.9 kg)   Height: 5' 2.5 (1.588 m)   PainSc: 0-No pain    Body mass index is 43.99 kg/m.     08/12/2021    6:23 AM 08/08/2021    4:19 AM 11/02/2020   10:13 AM 08/14/2019   10:10 AM 08/14/2019    8:14 AM 12/13/2018    2:35 PM 04/17/2018    7:15 PM  Advanced Directives  Does Patient Have a Medical Advance Directive? No No No No No No No   Would patient like information on creating a medical advance directive? No - Patient declined  No - Patient declined No - Patient declined  Yes (MAU/Ambulatory/Procedural Areas - Information given) No - Patient declined      Data saved with a previous flowsheet row definition    Current Medications (verified) Outpatient Encounter Medications as of 12/09/2023  Medication Sig   aspirin  EC 81 MG EC tablet Take 1 tablet (81 mg total) by mouth daily.   [DISCONTINUED] albuterol  (VENTOLIN  HFA) 108 (90 Base) MCG/ACT inhaler Inhale 2 puffs into the lungs every 6 (six) hours as needed for wheezing or shortness of breath.   [DISCONTINUED] atorvastatin  (LIPITOR) 40 MG tablet Take 1 tablet (40 mg total) by mouth daily at 6 PM.   [DISCONTINUED] fluticasone  (FLONASE ) 50 MCG/ACT nasal spray Use 2 spray(s) in each nostril once daily   [DISCONTINUED] fluticasone -salmeterol (ADVAIR ) 250-50 MCG/ACT AEPB Inhale 1 puff into the lungs in the morning and at bedtime.    [DISCONTINUED] lisinopril  (ZESTRIL ) 10 MG tablet Take 1 tablet by mouth once daily   albuterol  (VENTOLIN  HFA) 108 (90 Base) MCG/ACT inhaler Inhale 2 puffs into the lungs every 6 (six) hours as needed for wheezing or shortness of breath.   atorvastatin  (LIPITOR) 40 MG tablet Take 1 tablet (40 mg total) by mouth daily at 6 PM.   fluticasone  (FLONASE ) 50 MCG/ACT nasal spray Use 2 spray(s) in each nostril once daily   fluticasone -salmeterol (ADVAIR ) 250-50 MCG/ACT AEPB Inhale 1 puff into the lungs in the morning and at bedtime.   lisinopril  (ZESTRIL ) 10 MG tablet Take 1 tablet (10 mg total) by mouth daily.   [DISCONTINUED] lisinopril  (ZESTRIL ) 10 MG tablet Take 1 tablet (10 mg total) by mouth daily.   [DISCONTINUED] lisinopril  (ZESTRIL ) 10 MG tablet Take 1 tablet (10 mg total) by mouth daily.   [DISCONTINUED] meclizine  (ANTIVERT ) 12.5 MG tablet Take 1 tablet (12.5 mg total) by mouth 3 (three) times daily as needed for dizziness. (Patient not taking: Reported on 12/09/2023)   No facility-administered encounter medications on file as of 12/09/2023.    Allergies (verified) Patient has no known allergies.   History: Past Medical History:  Diagnosis Date   Asthma    Hypertension    Stroke [facility])    Past Surgical History:  Procedure Laterality Date   CYSTOSCOPY/URETEROSCOPY/HOLMIUM LASER/STENT PLACEMENT Right  08/12/2021   Procedure: CYSTOSCOPY/URETEROSCOPY/HOLMIUM LASER/STENT PLACEMENT/LITHOTRIPSY;  Surgeon: Twylla Glendia BROCKS, MD;  Location: [facility] ORS;  Service: Urology;  Laterality: Right;   IR CT HEAD LTD  04/18/2018   IR INTRAVSC STENT CERV CAROTID W/O EMB-PROT MOD SED INC ANGIO  04/18/2018   IR PERCUTANEOUS ART THROMBECTOMY/INFUSION INTRACRANIAL INC DIAG ANGIO  04/18/2018   OOPHORECTOMY     RADIOLOGY WITH ANESTHESIA N/A 04/17/2018   Procedure: RADIOLOGY WITH ANESTHESIA;  Surgeon: Dolphus Carrion, MD;  Location: [facility] OR;  Service: Radiology;  Laterality: N/A;   TOTAL ABDOMINAL HYSTERECTOMY  W/ BILATERAL SALPINGOOPHORECTOMY  2005   total   Family History  Problem Relation Age of Onset   Cancer Mother        lung   Cancer Father        brain   Diabetes Sister    Dementia Sister    Heart disease Sister        CHF   Alcohol abuse Brother    Stroke Son        x2   CAD Maternal Grandmother    Emphysema Paternal Grandfather    Breast cancer Neg Hx    Social History   Socioeconomic History   Marital status: Married    Spouse name: Not on file   Number of children: Not on file   Years of education: Not on file   Highest education level: Not on file  Occupational History   Not on file  Tobacco Use   Smoking status: Never   Smokeless tobacco: Never  Vaping Use   Vaping status: Never Used  Substance and Sexual Activity   Alcohol use: No   Drug use: No   Sexual activity: Never  Other Topics Concern   Not on file  Social History Narrative   Not on file   Social Drivers of Health   Financial Resource Strain: Low Risk  (12/03/2022)   Overall Financial Resource Strain (CARDIA)    Difficulty of Paying Living Expenses: Not hard at all  Food Insecurity: No Food Insecurity (12/09/2023)   Hunger Vital Sign    Worried About Running Out of Food in the Last Year: Never true    Ran Out of Food in the Last Year: Never true  Transportation Needs: No Transportation Needs (12/09/2023)   PRAPARE - Administrator, Civil Service (Medical): No    Lack of Transportation (Non-Medical): No  Physical Activity: Insufficiently Active (12/09/2023)   Exercise Vital Sign    Days of Exercise per Week: 2 days    Minutes of Exercise per Session: 20 min  Stress: No Stress Concern Present (12/09/2023)   [employer] of Occupational Health - Occupational Stress Questionnaire    Feeling of Stress: Not at all  Social Connections: Socially Isolated (12/09/2023)   Social Connection and Isolation Panel    Frequency of Communication with Friends and Family: Once a week     Frequency of Social Gatherings with Friends and Family: Never    Attends Religious Services: Never    Database administrator or Organizations: No    Attends Engineer, structural: Never    Marital Status: Married    Tobacco Counseling Counseling given: Not Answered   Clinical Intake:     Pain : No/denies pain Pain Score: 0-No pain     BMI - recorded: 44 Nutritional Status: BMI > 30  Obese Nutritional Risks: None Diabetes: No  How often do you need to have someone help you when  you read instructions, pamphlets, or other written materials from your doctor or pharmacy?: 1 - Never  Interpreter Needed?: No  Information entered by :: EMERSON Fogo, CMA   Activities of Daily Living    12/09/2023    9:07 AM  In your present state of health, do you have any difficulty performing the following activities:  Hearing? 0  Vision? 0  Difficulty concentrating or making decisions? 0  Walking or climbing stairs? 1  Dressing or bathing? 0  Doing errands, shopping? 0  Preparing Food and eating ? N  Using the Toilet? N  In the past six months, have you accidently leaked urine? N  Managing your Medications? N  Managing your Finances? N  Housekeeping or managing your Housekeeping? N    Patient Care Team: [name], [name], NP as PCP - General (Nurse Practitioner) Merlynn Lyle CROME, LCSW as Social Worker (Licensed Clinical Social Worker)  Indicate any recent [employer] you may have received from other than [facility] providers in the past year (date may be approximate).     Assessment:   This is a routine wellness examination for Lorena.  Hearing/Vision screen No results found.   Goals Addressed   None    Depression Screen    12/09/2023    9:10 AM 06/09/2023    9:15 AM 05/06/2023    9:21 AM 12/03/2022    9:57 AM 06/03/2022    9:46 AM 03/25/2022    9:47 AM 12/02/2021    2:26 PM  PHQ 2/9 Scores  PHQ - 2 Score 0 0 0 0 0 0 0  PHQ- 9 Score 0 0 0 0 0 0 1    Fall  Risk    12/09/2023    9:08 AM 06/09/2023    9:15 AM 05/06/2023    9:21 AM 12/03/2022    9:57 AM 06/03/2022    9:46 AM  Fall Risk   Falls in the past year? 0 0 0 0 0  Number falls in past yr: 0 0 0 0 0  Injury with Fall? 0 0 0 0 0  Risk for fall due to : Impaired balance/gait;Impaired mobility No Fall Risks No Fall Risks No Fall Risks No Fall Risks  Follow up Falls evaluation completed Falls evaluation completed Falls evaluation completed Falls evaluation completed Falls evaluation completed    MEDICARE RISK AT HOME: Medicare Risk at Home If so, are there any without handrails?: No Home free of loose throw rugs in walkways, pet beds, electrical cords, etc?: Yes Adequate lighting in your home to reduce risk of falls?: Yes Life alert?: No Use of a cane, walker or w/c?: Yes Grab bars in the bathroom?: No Shower chair or bench in shower?: Yes Elevated toilet seat or a handicapped toilet?: No  TIMED UP AND GO:  Was the test performed?  Yes  Length of time to ambulate 10 feet: 20 sec Gait steady and fast with assistive device with cane.   Cognitive Function:        12/09/2023    9:09 AM 12/03/2022    9:59 AM 11/03/2021   11:07 AM 11/02/2020   10:14 AM  6CIT Screen  What Year? 0 points 0 points 0 points 0 points  What month? 0 points 0 points 0 points 0 points  What time? 0 points 0 points 0 points 0 points  Count back from 20 0 points 0 points 0 points 0 points  Months in reverse 2 points 0 points 0 points 0 points  Repeat phrase 2 points 2 points 2 points 2 points  Total Score 4 points 2 points 2 points 2 points    Immunizations Immunization History  Administered Date(s) Administered   Fluad Quad(high Dose 65+) 12/02/2021   Fluad Trivalent(High Dose 65+) 12/03/2022   INFLUENZA, HIGH DOSE SEASONAL PF 12/09/2023   Influenza,inj,Quad PF,6+ Mos 12/07/2018, 01/22/2020, 11/22/2020   PFIZER(Purple Top)SARS-COV-2 Vaccination 05/09/2019, 05/30/2019, 02/09/2020   PNEUMOCOCCAL  CONJUGATE-20 12/02/2021   Pneumococcal Polysaccharide-23 10/24/2012   Td 10/09/2019   Tdap 05/19/2007   Zoster Recombinant(Shingrix) 11/02/2020, 08/15/2021    TDAP status: Up to date  Flu Vaccine status: Completed at today's visit  Pneumococcal vaccine status: Up to date  Covid-19 vaccine status: Declined, Education has been provided regarding the importance of this vaccine but patient still declined. Advised may receive this vaccine at local pharmacy or Health Dept.or vaccine clinic. Aware to provide a copy of the vaccination record if obtained from local pharmacy or Health Dept. Verbalized acceptance and understanding.  Qualifies for Shingles Vaccine? Yes   Zostavax completed No   Shingrix Completed?: Yes  Screening Tests Health Maintenance  Topic Date Due   Medicare Annual Wellness (AWV)  12/03/2023   COVID-19 Vaccine (4 - 2025-26 season) 12/22/2023 (Originally 10/18/2023)   Colonoscopy  12/08/2024 (Originally 09/12/2001)   Mammogram  10/19/2025   DEXA SCAN  12/18/2026   DTaP/Tdap/Td (3 - Td or Tdap) 10/08/2029   Pneumococcal Vaccine: 50+ Years  Completed   Influenza Vaccine  Completed   Hepatitis C Screening  Completed   Zoster Vaccines- Shingrix  Completed   Meningococcal B Vaccine  Aged Out    Health Maintenance  Health Maintenance Due  Topic Date Due   Medicare Annual Wellness (AWV)  12/03/2023    Patient declines colonoscopy screening. Education provided for patient.   Mammogram status: Completed 11/16/2023. Repeat every year  Bone Density status: Completed 12/17/2021. Results reflect: Bone density results: OSTEOPOROSIS. Repeat every 5 years.  Lung Cancer Screening: (Low Dose CT Chest recommended if Age 31-80 years, 20 pack-year currently smoking OR have quit w/in 15years.) does not qualify.    Additional Screening:  Hepatitis C Screening: does qualify; Completed 11/22/2020  Vision Screening: Recommended annual ophthalmology exams for early detection of  glaucoma and other disorders of the eye. Is the patient up to date with their annual eye exam?  Yes  Who is the provider or what is the name of the office in which the patient attends annual eye exams? Dr. Mevelyn   Dental Screening: Recommended annual dental exams for proper oral hygiene  Community Resource Referral / Chronic Care Management: CRR required this visit?  No   CCM required this visit?  No     Plan:     I have personally reviewed and noted the following in the patient's chart:   Medical and social history Use of alcohol, tobacco or illicit drugs  Current medications and supplements including opioid prescriptions. Patient is not currently taking opioid prescriptions. Functional ability and status Nutritional status Physical activity Advanced directives List of other physicians Hospitalizations, surgeries, and ER visits in previous 12 months Vitals Screenings to include cognitive, depression, and falls Referrals and appointments  In addition, I have reviewed and discussed with patient certain preventive protocols, quality metrics, and best practice recommendations. A written personalized care plan for preventive services as well as general preventive health recommendations were provided to patient.     Michale ONEIDA Fogo, CMA   12/09/2023   After Visit Summary: (In Person-Printed)  AVS printed and given to the patient

## 2023-12-09 NOTE — Assessment & Plan Note (Signed)
 Chronic, stable.  BP often at goal at home and stable on recheck today, may adjust medication next visit if any ongoing elevations at home.  Some white coat is present. Continue Lisinopril  10 MG daily, as is offering benefit.  Continue daily BP checks at home and focus on DASH diet regimen.  Discussed strict goal BP <130/80 due to history of stroke.  Labs today: CBC, CMP, TSH.

## 2023-12-09 NOTE — Progress Notes (Signed)
 BP 130/82 (BP Location: Left Arm, Patient Position: Sitting, Cuff Size: Large)   Pulse 60   Temp 97.9 F (36.6 C) (Oral)   Ht 5' 2.5 (1.588 m)   Wt 244 lb 6.4 oz (110.9 kg)   SpO2 98%   BMI 43.99 kg/m    Subjective:    Patient ID: Kristin Solis, female    DOB: Aug 08, 1955, 68 y.o.   MRN: [id number]  HPI: Kristin Solis is a 68 y.o. female presenting on 12/09/2023 for comprehensive medical examination. Current medical complaints include:none  She currently lives with: husband Menopausal Symptoms: no  HYPERTENSION / HYPERLIPIDEMIA Takes Lisinopril , ASA, and Atorvastatin  daily.  Uses CPAP nightly with nose piece only and pads -- she would like a mouth piece, but [facility] does not have those. History of CVA March 2020. Last saw neurology, Dr. Rosemarie, in October 2021. Aortic atherosclerosis noted on imaging 10/30/20. She would like to start [facility].  Has been using compression pumps for legs at home which has helped her discomfort. Was given an older machine by her niece, but this is now broken.  Would like new equipment sent to [facility] [phone] or [facility] [phone] (these are only two companies her insurance deals with for this). Satisfied with current treatment? yes Duration of hypertension: chronic BP monitoring frequency: daily BP range: 116/53 to 158/76 -- on average <130/80 BP medication side effects: no Duration of hyperlipidemia: chronic Cholesterol medication side effects: no Cholesterol supplements: none Medication compliance: good compliance Aspirin : yes Recent stressors: no Recurrent headaches: no Visual changes: no Palpitations: no Dyspnea: no Chest pain: no Lower extremity edema: baseline BLE Dizzy/lightheaded:one episode recently, but no others   Impaired Fasting Glucose HbA1C:  Lab Results  Component Value Date   HGBA1C 5.4 12/09/2023  Duration of elevated blood sugar: chronic Polydipsia: no Polyuria: no Weight change: no Visual  disturbance: no Glucose Monitoring: no    Accucheck frequency: Not Checking    Fasting glucose:     Post prandial:  Diabetic Education: Not Completed Family history of diabetes: yes -- sister    ASTHMA Continues Advair  every day.  Albuterol  as needed Asthma status: stable Satisfied with current treatment?: yes Albuterol /rescue inhaler frequency: rarely, has not used in about 6 months Dyspnea frequency: no Wheezing frequency: no Cough frequency: no Nocturnal symptom frequency: no Limitation of activity: no Current upper respiratory symptoms: no Triggers: she cannot tell Home peak flows: none Last Spirometry: unknown Failed/intolerant to following asthma meds: none Asthma meds in past: as above Aerochamber/spacer use: no Visits to ER or Urgent Care in past year: no Pneumovax: Up To Date Influenza: Up to Date   OSTEOPENIA DEXA 12/17/21 with T-score -1.5. Continues Vitamin D  daily. No recent falls or fractures. Satisfied with current treatment?: yes Adequate calcium  & vitamin D : yes Weight bearing exercises: yes   STRESS INCONTINENCE Last saw urology on 08/18/21. Had cystoscopy 08/12/21 with stone removal. Continues to wear briefs, Medicare does not cover these. Dysuria: no Urinary frequency: no Urgency: no Small volume voids: no Symptom severity: no Urinary incontinence: occasional Foul odor: no Hematuria: no Abdominal pain: no Back pain: no Suprapubic pain/pressure: no Flank pain: no  Depression Screen done today and results listed below:     12/09/2023    9:10 AM 06/09/2023    9:15 AM 05/06/2023    9:21 AM 12/03/2022    9:57 AM 06/03/2022    9:46 AM  Depression screen PHQ 2/9  Decreased Interest 0 0 0 0 0  Down, Depressed, Hopeless 0 0 0 0 0  PHQ - 2 Score 0 0 0 0 0  Altered sleeping 0 0 0 0 0  Tired, decreased energy 0 0 0 0 0  Change in appetite 0 0 0 0 0  Feeling bad or failure about yourself  0 0 0 0 0  Trouble concentrating 0 0 0 0 0  Moving slowly or  fidgety/restless 0 0 0 0 0  Suicidal thoughts 0 0 0 0 0  PHQ-9 Score 0 0 0 0 0  Difficult doing work/chores Not difficult at all Not difficult at all Not difficult at all Not difficult at all Not difficult at all      12/09/2023    9:10 AM 06/09/2023    9:15 AM 05/06/2023    9:21 AM 12/03/2022    9:59 AM  GAD 7 : Generalized Anxiety Score  Nervous, Anxious, on Edge 0 0 0 0  Control/stop worrying 0 0 0 0  Worry too much - different things 0 0 0 0  Trouble relaxing 0 0 0 0  Restless 0 0 0 0  Easily annoyed or irritable 0 0 0 0  Afraid - awful might happen 0 0 0 0  Total GAD 7 Score 0 0 0 0  Anxiety Difficulty Not difficult at all Not difficult at all Not difficult at all Not difficult at all      06/03/2022    9:46 AM 12/03/2022    9:57 AM 05/06/2023    9:21 AM 06/09/2023    9:15 AM 12/09/2023    9:08 AM  Fall Risk  Falls in the past year? 0 0 0 0 0  Was there an injury with Fall? 0 0 0 0 0  Fall Risk Category Calculator 0 0 0 0 0  Patient at Risk for Falls Due to No Fall Risks No Fall Risks No Fall Risks No Fall Risks Impaired balance/gait;Impaired mobility  Fall risk Follow up Falls evaluation completed Falls evaluation completed Falls evaluation completed Falls evaluation completed Falls evaluation completed    Functional Status Survey: Is the patient deaf or have difficulty hearing?: No Does the patient have difficulty seeing, even when wearing glasses/contacts?: No Does the patient have difficulty concentrating, remembering, or making decisions?: No Does the patient have difficulty walking or climbing stairs?: Yes Does the patient have difficulty dressing or bathing?: No Does the patient have difficulty doing errands alone such as visiting a doctor's office or shopping?: No    Past Medical History:  Past Medical History:  Diagnosis Date   Asthma    Hypertension    Stroke [facility])     Surgical History:  Past Surgical History:  Procedure Laterality Date    CYSTOSCOPY/URETEROSCOPY/HOLMIUM LASER/STENT PLACEMENT Right 08/12/2021   Procedure: CYSTOSCOPY/URETEROSCOPY/HOLMIUM LASER/STENT PLACEMENT/LITHOTRIPSY;  Surgeon: Twylla Glendia BROCKS, MD;  Location: [facility] ORS;  Service: Urology;  Laterality: Right;   IR CT HEAD LTD  04/18/2018   IR INTRAVSC STENT CERV CAROTID W/O EMB-PROT MOD SED INC ANGIO  04/18/2018   IR PERCUTANEOUS ART THROMBECTOMY/INFUSION INTRACRANIAL INC DIAG ANGIO  04/18/2018   OOPHORECTOMY     RADIOLOGY WITH ANESTHESIA N/A 04/17/2018   Procedure: RADIOLOGY WITH ANESTHESIA;  Surgeon: Dolphus Carrion, MD;  Location: [facility] OR;  Service: Radiology;  Laterality: N/A;   TOTAL ABDOMINAL HYSTERECTOMY W/ BILATERAL SALPINGOOPHORECTOMY  2005   total    Medications:  Current Outpatient Medications on File Prior to Visit  Medication Sig   aspirin  EC 81 MG EC tablet  Take 1 tablet (81 mg total) by mouth daily.   No current facility-administered medications on file prior to visit.    Allergies:  No Known Allergies  Social History:  Social History   Socioeconomic History   Marital status: Married    Spouse name: Not on file   Number of children: Not on file   Years of education: Not on file   Highest education level: Not on file  Occupational History   Not on file  Tobacco Use   Smoking status: Never   Smokeless tobacco: Never  Vaping Use   Vaping status: Never Used  Substance and Sexual Activity   Alcohol use: No   Drug use: No   Sexual activity: Never  Other Topics Concern   Not on file  Social History Narrative   Not on file   Social Drivers of Health   Financial Resource Strain: Low Risk  (12/03/2022)   Overall Financial Resource Strain (CARDIA)    Difficulty of Paying Living Expenses: Not hard at all  Food Insecurity: No Food Insecurity (12/09/2023)   Hunger Vital Sign    Worried About Running Out of Food in the Last Year: Never true    Ran Out of Food in the Last Year: Never true  Transportation Needs: No  Transportation Needs (12/09/2023)   PRAPARE - Administrator, Civil Service (Medical): No    Lack of Transportation (Non-Medical): No  Physical Activity: Insufficiently Active (12/09/2023)   Exercise Vital Sign    Days of Exercise per Week: 2 days    Minutes of Exercise per Session: 20 min  Stress: No Stress Concern Present (12/09/2023)   [employer] of Occupational Health - Occupational Stress Questionnaire    Feeling of Stress: Not at all  Social Connections: Socially Isolated (12/09/2023)   Social Connection and Isolation Panel    Frequency of Communication with Friends and Family: Once a week    Frequency of Social Gatherings with Friends and Family: Never    Attends Religious Services: Never    Database administrator or Organizations: No    Attends Banker Meetings: Never    Marital Status: Married  Catering manager Violence: Not At Risk (12/09/2023)   Humiliation, Afraid, Rape, and Kick questionnaire    Fear of Current or Ex-Partner: No    Emotionally Abused: No    Physically Abused: No    Sexually Abused: No   Social History   Tobacco Use  Smoking Status Never  Smokeless Tobacco Never   Social History   Substance and Sexual Activity  Alcohol Use No    Family History:  Family History  Problem Relation Age of Onset   Cancer Mother        lung   Cancer Father        brain   Diabetes Sister    Dementia Sister    Heart disease Sister        CHF   Alcohol abuse Brother    Stroke Son        x2   CAD Maternal Grandmother    Emphysema Paternal Grandfather    Breast cancer Neg Hx     Past medical history, surgical history, medications, allergies, family history and social history reviewed with patient today and changes made to appropriate areas of the chart.   ROS All other ROS negative except what is listed above and in the HPI.      Objective:    BP 130/82 (  BP Location: Left Arm, Patient Position: Sitting, Cuff Size: Large)    Pulse 60   Temp 97.9 F (36.6 C) (Oral)   Ht 5' 2.5 (1.588 m)   Wt 244 lb 6.4 oz (110.9 kg)   SpO2 98%   BMI 43.99 kg/m   Wt Readings from Last 3 Encounters:  12/09/23 244 lb 6.4 oz (110.9 kg)  07/07/23 240 lb 6.4 oz (109 kg)  06/09/23 239 lb 12.8 oz (108.8 kg)    Physical Exam Vitals and nursing note reviewed. Exam conducted with a chaperone present.  Constitutional:      General: She is awake. She is not in acute distress.    Appearance: Normal appearance. She is well-developed and well-groomed. She is obese. She is not ill-appearing or toxic-appearing.  HENT:     Head: Normocephalic and atraumatic.     Right Ear: Hearing, tympanic membrane, ear canal and external ear normal. No drainage.     Left Ear: Hearing, tympanic membrane, ear canal and external ear normal. No drainage.     Nose: Nose normal.     Right Sinus: No maxillary sinus tenderness or frontal sinus tenderness.     Left Sinus: No maxillary sinus tenderness or frontal sinus tenderness.     Mouth/Throat:     Mouth: Mucous membranes are moist.     Pharynx: Oropharynx is clear. Uvula midline. No pharyngeal swelling, oropharyngeal exudate or posterior oropharyngeal erythema.  Eyes:     General: Lids are normal.        Right eye: No discharge.        Left eye: No discharge.     Extraocular Movements: Extraocular movements intact.     Conjunctiva/sclera: Conjunctivae normal.     Pupils: Pupils are equal, round, and reactive to light.     Visual Fields: Right eye visual fields normal and left eye visual fields normal.  Neck:     Thyroid : No thyromegaly.     Vascular: No carotid bruit.     Trachea: Trachea normal.  Cardiovascular:     Rate and Rhythm: Normal rate and regular rhythm.     Heart sounds: Murmur heard.     Systolic murmur is present with a grade of 2/6.     No diastolic murmur is present.     No gallop.  Pulmonary:     Effort: Pulmonary effort is normal. No accessory muscle usage or respiratory  distress.     Breath sounds: Normal breath sounds. No decreased breath sounds, wheezing or rales.  Chest:  Breasts:    Right: Normal.     Left: Normal.  Abdominal:     General: Bowel sounds are normal.     Palpations: Abdomen is soft. There is no hepatomegaly or splenomegaly.     Tenderness: There is no abdominal tenderness.  Musculoskeletal:        General: Normal range of motion.     Cervical back: Normal range of motion and neck supple.     Right lower leg: 1+ Edema present.     Left lower leg: 1+ Edema present.  Lymphadenopathy:     Head:     Right side of head: No submental, submandibular, tonsillar, preauricular or posterior auricular adenopathy.     Left side of head: No submental, submandibular, tonsillar, preauricular or posterior auricular adenopathy.     Cervical: No cervical adenopathy.     Upper Body:     Right upper body: No supraclavicular, axillary or pectoral adenopathy.  Left upper body: No supraclavicular, axillary or pectoral adenopathy.  Skin:    General: Skin is warm and dry.     Capillary Refill: Capillary refill takes less than 2 seconds.     Findings: No rash.  Neurological:     Mental Status: She is alert and oriented to person, place, and time.     Gait: Gait is intact.     Deep Tendon Reflexes: Reflexes are normal and symmetric.     Reflex Scores:      Brachioradialis reflexes are 2+ on the right side and 2+ on the left side.      Patellar reflexes are 2+ on the right side and 2+ on the left side. Psychiatric:        Attention and Perception: Attention normal.        Mood and Affect: Mood normal.        Speech: Speech normal.        Behavior: Behavior normal. Behavior is cooperative.        Thought Content: Thought content normal.        Judgment: Judgment normal.       12/09/2023    9:09 AM 12/03/2022    9:59 AM 11/03/2021   11:07 AM 11/02/2020   10:14 AM  6CIT Screen  What Year? 0 points 0 points 0 points 0 points  What month? 0 points  0 points 0 points 0 points  What time? 0 points 0 points 0 points 0 points  Count back from 20 0 points 0 points 0 points 0 points  Months in reverse 2 points 0 points 0 points 0 points  Repeat phrase 2 points 2 points 2 points 2 points  Total Score 4 points 2 points 2 points 2 points   Results for orders placed or performed in visit on 12/09/23  Bayer DCA Hb A1c Waived   Collection Time: 12/09/23  9:51 AM  Result Value Ref Range   HB A1C (BAYER DCA - WAIVED) 5.4 4.8 - 5.6 %      Assessment & Plan:   Problem List Items Addressed This Visit       Cardiovascular and Mediastinum   Essential hypertension, benign   Chronic, stable.  BP often at goal at home and stable on recheck today, may adjust medication next visit if any ongoing elevations at home.  Some white coat is present. Continue Lisinopril  10 MG daily, as is offering benefit.  Continue daily BP checks at home and focus on DASH diet regimen.  Discussed strict goal BP <130/80 due to history of stroke.  Labs today: CBC, CMP, TSH.      Relevant Medications   atorvastatin  (LIPITOR) 40 MG tablet   lisinopril  (ZESTRIL ) 10 MG tablet   Other Relevant Orders   CBC with Differential/Platelet   Comprehensive metabolic panel with GFR   TSH     Respiratory   Obstructive sleep apnea   Chronic, stable.  Recommend use of CPAP 100% of the time, has benefit from this. Will see if can get mouth piece for her.      Asthma   Chronic, ongoing.  Continue current medication regimen and adjust as needed.  Consider spirometry next visit.        Relevant Medications   albuterol  (VENTOLIN  HFA) 108 (90 Base) MCG/ACT inhaler   fluticasone -salmeterol (ADVAIR ) 250-50 MCG/ACT AEPB     Endocrine   IFG (impaired fasting glucose)   Ongoing.  A1c trend down to 5.4%, trend down from  5.7%.  Check A1c today and treat as needed.  Continue focus on healthy diet and regular activity.      Relevant Orders   Bayer DCA Hb A1c Waived (Completed)      Musculoskeletal and Integument   Osteopenia of neck of right femur   On screening 12/17/21.  Continue Vitamin D  and a multivitamin daily + walking.  Recommend she continue this.  Will plan on repeat DEXA around 12/18/2026.  Check vitamin D  level today.      Relevant Orders   VITAMIN D  25 Hydroxy (Vit-D Deficiency, Fractures)     Other   Stress incontinence   Chronic, stable.  Continue daily use of pads as needed, consider PT for pelvic floor if worsening.  Recommend focus on Kegel exercises and pelvic floor strengthening.  Will assist on getting briefs in future if needed.      Morbid obesity (HCC) - Primary   BMI 43.99 with HTN/HLD, history of CVA.  Recommended eating smaller high protein, low fat meals more frequently and exercising 30 mins a day 5 times a week with a goal of 10-15lb weight loss in the next 3 months. Patient voiced their understanding and motivation to adhere to these recommendations.       Mixed hyperlipidemia   Chronic, stable.  Continue current medication regimen and adjust as needed.  Recent LDL at goal, well below neuro goal <70 -- with new guidelines goal LDL <55.  Lipid panel today.      Relevant Medications   atorvastatin  (LIPITOR) 40 MG tablet   lisinopril  (ZESTRIL ) 10 MG tablet   Other Relevant Orders   Comprehensive metabolic panel with GFR   Lipid Panel w/o Chol/HDL Ratio   Lymphedema   Bilateral lower legs.  Recommend use of compression socks, but she reports these are uncomfortable and has tried them multiple times.  Elevation of legs often.  Will work on lymphedema pump for patient, she borrowed a family members and found benefit.      History of stroke   Has improved greatly since event in March 2020.  Continue focus on prevention with BP <130/80, A1c <6.5%, and LDL <55.  Lipid panel today.      Other Visit Diagnoses       Medicare annual wellness visit, subsequent         Flu vaccine need       Flu vaccine today, educated patient.      Encounter for annual physical exam       Annual physical today with labs and health maintenance reviewed, discussed with patient.        Follow up plan: Return in about 6 months (around 06/08/2024) for HTN/HLD, ASTHMA, IFG.   LABORATORY TESTING:  - Pap smear: not applicable  IMMUNIZATIONS:   - Tdap: Tetanus vaccination status reviewed: last tetanus booster within 10 years. - Influenza: Provided today - Pneumovax: Up to date - Prevnar: Up to date - COVID: Up to date - HPV: Not applicable - Shingrix vaccine: Up to date  SCREENING: -Mammogram: Up to date  - Colonoscopy: Refused  - Bone Density: Up to date  -Hearing Test: Not applicable  -Spirometry: Not applicable   PATIENT COUNSELING:   Advised to take 1 mg of folate supplement per day if capable of pregnancy.   Sexuality: Discussed sexually transmitted diseases, partner selection, use of condoms, avoidance of unintended pregnancy  and contraceptive alternatives.   Advised to avoid cigarette smoking.  I discussed with the patient that most  people either abstain from alcohol or drink within safe limits (<=14/week and <=4 drinks/occasion for males, <=7/weeks and <= 3 drinks/occasion for females) and that the risk for alcohol disorders and other health effects rises proportionally with the number of drinks per week and how often a drinker exceeds daily limits.  Discussed cessation/primary prevention of drug use and availability of treatment for abuse.   Diet: Encouraged to adjust caloric intake to maintain  or achieve ideal body weight, to reduce intake of dietary saturated fat and total fat, to limit sodium intake by avoiding high sodium foods and not adding table salt, and to maintain adequate dietary potassium and calcium  preferably from fresh fruits, vegetables, and low-fat dairy products.    Stressed the importance of regular exercise  Injury prevention: Discussed safety belts, safety helmets, smoke detector, smoking near  bedding or upholstery.   Dental health: Discussed importance of regular tooth brushing, flossing, and dental visits.    NEXT PREVENTATIVE PHYSICAL DUE IN 1 YEAR. Return in about 6 months (around 06/08/2024) for HTN/HLD, ASTHMA, IFG.

## 2023-12-09 NOTE — Assessment & Plan Note (Signed)
 Ongoing.  A1c trend down to 5.4%, trend down from 5.7%.  Check A1c today and treat as needed.  Continue focus on healthy diet and regular activity.

## 2023-12-09 NOTE — Assessment & Plan Note (Signed)
Has improved greatly since event in March 2020.  Continue focus on prevention with BP <130/80, A1c <6.5%, and LDL <55.  Lipid panel today.

## 2023-12-09 NOTE — Assessment & Plan Note (Signed)
Chronic, ongoing.  Continue current medication regimen and adjust as needed.  Consider spirometry next visit.

## 2023-12-09 NOTE — Assessment & Plan Note (Signed)
 On screening 12/17/21.  Continue Vitamin D  and a multivitamin daily + walking.  Recommend she continue this.  Will plan on repeat DEXA around 12/18/2026.  Check vitamin D  level today.

## 2023-12-10 ENCOUNTER — Ambulatory Visit: Payer: Self-pay | Admitting: Nurse Practitioner

## 2023-12-10 LAB — LIPID PANEL W/O CHOL/HDL RATIO
Cholesterol, Total: 132 mg/dL (ref 100–199)
HDL: 68 mg/dL (ref >39)
LDL Chol Calc (NIH): 51 mg/dL (ref 0–99)
Triglycerides: 60 mg/dL (ref 0–149)
VLDL Cholesterol Cal: 13 mg/dL (ref 5–40)

## 2023-12-10 LAB — CBC WITH DIFFERENTIAL/PLATELET
Basophils Absolute: 0 x10E3/uL (ref 0.0–0.2)
Basos: 0 %
EOS (ABSOLUTE): 0.1 x10E3/uL (ref 0.0–0.4)
Eos: 1 %
Hematocrit: 44.1 % (ref 34.0–46.6)
Hemoglobin: 14.1 g/dL (ref 11.1–15.9)
Immature Grans (Abs): 0 x10E3/uL (ref 0.0–0.1)
Immature Granulocytes: 0 %
Lymphocytes Absolute: 1.2 x10E3/uL (ref 0.7–3.1)
Lymphs: 25 %
MCH: 29.6 pg (ref 26.6–33.0)
MCHC: 32 g/dL (ref 31.5–35.7)
MCV: 93 fL (ref 79–97)
Monocytes Absolute: 0.4 x10E3/uL (ref 0.1–0.9)
Monocytes: 8 %
Neutrophils Absolute: 3.2 x10E3/uL (ref 1.4–7.0)
Neutrophils: 66 %
Platelets: 271 x10E3/uL (ref 150–450)
RBC: 4.77 x10E6/uL (ref 3.77–5.28)
RDW: 12.6 % (ref 11.7–15.4)
WBC: 4.9 x10E3/uL (ref 3.4–10.8)

## 2023-12-10 LAB — COMPREHENSIVE METABOLIC PANEL WITH GFR
ALT: 18 IU/L (ref 0–32)
AST: 19 IU/L (ref 0–40)
Albumin: 4.1 g/dL (ref 3.9–4.9)
Alkaline Phosphatase: 132 IU/L (ref 49–135)
BUN/Creatinine Ratio: 15 (ref 12–28)
BUN: 14 mg/dL (ref 8–27)
Bilirubin Total: 0.4 mg/dL (ref 0.0–1.2)
CO2: 22 mmol/L (ref 20–29)
Calcium: 10.1 mg/dL (ref 8.7–10.3)
Chloride: 106 mmol/L (ref 96–106)
Creatinine, Ser: 0.95 mg/dL (ref 0.57–1.00)
Globulin, Total: 2.5 g/dL (ref 1.5–4.5)
Glucose: 95 mg/dL (ref 70–99)
Potassium: 4.4 mmol/L (ref 3.5–5.2)
Sodium: 143 mmol/L (ref 134–144)
Total Protein: 6.6 g/dL (ref 6.0–8.5)
eGFR: 66 mL/min/1.73 (ref >59)

## 2023-12-10 LAB — TSH: TSH: 1.02 u[IU]/mL (ref 0.450–4.500)

## 2023-12-10 LAB — VITAMIN D 25 HYDROXY (VIT D DEFICIENCY, FRACTURES): Vit D, 25-Hydroxy: 58.8 ng/mL (ref 30.0–100.0)

## 2023-12-10 NOTE — Progress Notes (Signed)
 Contacted via MyChart  Good afternoon Pony, your labs have returned and overall are stable.  They look great.  No medication changes needed.  Any questions? Keep being amazing!!  Thank you for allowing me to participate in your care.  I appreciate you. Kindest regards, Anae Hams

## 2023-12-23 ENCOUNTER — Telehealth: Payer: Self-pay

## 2023-12-23 DIAGNOSIS — I890 Lymphedema, not elsewhere classified: Secondary | ICD-10-CM

## 2023-12-23 NOTE — Telephone Encounter (Signed)
 Ok for E2C2 to review.  Left message for patient. Wondering if she by chance knows the settings of the current lymphemia pump she is using.  Half legs or full legs?  Pressure range: Low and High Minutes per session: Times per day:  Non segmental, segmental gradient pressure

## 2023-12-24 ENCOUNTER — Encounter: Payer: Self-pay | Admitting: Nurse Practitioner

## 2023-12-24 NOTE — Telephone Encounter (Signed)
 Copied from CRM #[id number]. Topic: General - Other >> Dec 24, 2023  9:21 AM Kristin Solis wrote: Reason for CRM: Patient called in regarding a missed call from [facility], would like a callback

## 2023-12-27 NOTE — Telephone Encounter (Signed)
 This encounter was created in error - please disregard.

## 2023-12-27 NOTE — Telephone Encounter (Signed)
 Copied from CRM #[id number]. Topic: General - Other >> Dec 24, 2023  9:21 AM Thersia BROCKS wrote: Reason for CRM: Patient called in regarding a missed call from [facility], would like a callback

## 2023-12-27 NOTE — Telephone Encounter (Signed)
 Pressure range needs to be between 1-20 mmHg

## 2023-12-29 NOTE — Addendum Note (Signed)
 Addended by: Kenyada Hy T on: 12/29/2023 05:09 PM   Modules accepted: Orders

## 2023-12-29 NOTE — Telephone Encounter (Signed)
 Spoke to patient based on provider request. She now understands that based on some additional questions being asked that we are not yet able to proceed with the order for the lymphedema pumps. She is ok with provider sending the vascular referral.

## 2024-01-03 ENCOUNTER — Ambulatory Visit: Payer: Self-pay

## 2024-01-03 ENCOUNTER — Encounter: Payer: Self-pay | Admitting: Nurse Practitioner

## 2024-01-03 ENCOUNTER — Ambulatory Visit (INDEPENDENT_AMBULATORY_CARE_PROVIDER_SITE_OTHER): Admitting: Nurse Practitioner

## 2024-01-03 VITALS — BP 128/68 | HR 77 | Temp 98.1°F | Resp 15 | Ht 62.52 in | Wt 246.0 lb

## 2024-01-03 DIAGNOSIS — H6691 Otitis media, unspecified, right ear: Secondary | ICD-10-CM | POA: Insufficient documentation

## 2024-01-03 DIAGNOSIS — H6501 Acute serous otitis media, right ear: Secondary | ICD-10-CM | POA: Diagnosis not present

## 2024-01-03 MED ORDER — AMOXICILLIN-POT CLAVULANATE 875-125 MG PO TABS: 1.0000 | ORAL_TABLET | Freq: Two times a day (BID) | ORAL | 0 refills | Status: AC

## 2024-01-03 NOTE — Telephone Encounter (Signed)
 Patient scheduled by PAS- left VM to call back to discuss symptoms  Copied from CRM #[id number]. Topic: Clinical - Red Word Triage >> Jan 03, 2024  8:03 AM Thersia BROCKS wrote: [facility] that prompted transfer to Nurse Triage: Patient called in stated she is experiencing some  pain in right ear and then started to get dizzy    ----------------------------------------------------------------------- From previous Reason for Contact - Scheduling: Patient/patient representative is calling to schedule an appointment. Refer to attachments for appointment information. >> Jan 03, 2024  8:11 AM Thersia BROCKS wrote: Santina back over to check on patient while on hold, patient stated she didn't want to speak with a nurse and wanted to be schedule an appointment , patient then hung up

## 2024-01-03 NOTE — Assessment & Plan Note (Signed)
 Acute to right ear. Will start Augmentin BID for 7 days. Recommend no Q tips at home and try to avoid emerging in water until symptoms improved. If any worsening or ongoing then return to office.

## 2024-01-03 NOTE — Telephone Encounter (Signed)
 Noted

## 2024-01-03 NOTE — Patient Instructions (Signed)

## 2024-01-03 NOTE — Progress Notes (Signed)
 BP 128/68 (BP Location: Left Arm, Patient Position: Sitting, Cuff Size: Large)   Pulse 77   Temp 98.1 F (36.7 C) (Oral)   Resp 15   Ht 5' 2.52 (1.588 m)   Wt 246 lb (111.6 kg)   SpO2 98%   BMI 44.25 kg/m    Subjective:    Patient ID: Kristin Solis Greet, female    DOB: 14-May-1955, 68 y.o.   MRN: [id number]  HPI: Kristin Solis is a 68 y.o. female  Chief Complaint  Patient presents with   Ear Pain    Started on Saturday. Sharp pain shooting and dizzy.    EAR PAIN Started Saturday with symptoms, dizziness and pain, right ear. Duration: days Involved ear(s): right Severity:  6/10  Quality:  sharp, aching, and throbbing Fever: no Otorrhea: no Upper respiratory infection symptoms: no Pruritus: no Hearing loss: no Water immersion no Using Q-tips: sometimes Recurrent otitis media: no Status: fluctuating Treatments attempted: Tylenol    Relevant past medical, surgical, family and social history reviewed and updated as indicated. Interim medical history since our last visit reviewed. Allergies and medications reviewed and updated.  Review of Systems  Constitutional:  Negative for activity change, appetite change, diaphoresis, fatigue and fever.  HENT:  Positive for ear pain. Negative for ear discharge.   Respiratory:  Negative for cough, chest tightness and shortness of breath.   Cardiovascular:  Negative for chest pain, palpitations and leg swelling.  Gastrointestinal: Negative.   Neurological:  Positive for dizziness. Negative for syncope, facial asymmetry, speech difficulty, weakness, light-headedness, numbness and headaches.  Psychiatric/Behavioral: Negative.      Per HPI unless specifically indicated above     Objective:    BP 128/68 (BP Location: Left Arm, Patient Position: Sitting, Cuff Size: Large)   Pulse 77   Temp 98.1 F (36.7 C) (Oral)   Resp 15   Ht 5' 2.52 (1.588 m)   Wt 246 lb (111.6 kg)   SpO2 98%   BMI 44.25 kg/m   Wt Readings from Last 3  Encounters:  01/03/24 246 lb (111.6 kg)  12/09/23 244 lb 6.4 oz (110.9 kg)  07/07/23 240 lb 6.4 oz (109 kg)    Physical Exam Vitals and nursing note reviewed.  Constitutional:      General: She is awake. She is not in acute distress.    Appearance: She is well-developed and well-groomed. She is obese. She is not ill-appearing or toxic-appearing.  HENT:     Head: Normocephalic.     Right Ear: Hearing, ear canal and external ear normal. No tenderness. A middle ear effusion is present. There is no impacted cerumen. Tympanic membrane is injected. Tympanic membrane is not perforated.     Left Ear: Hearing, tympanic membrane, ear canal and external ear normal. No tenderness.  No middle ear effusion. There is no impacted cerumen. Tympanic membrane is not injected or perforated.  Eyes:     General: Lids are normal.        Right eye: No discharge.        Left eye: No discharge.     Conjunctiva/sclera: Conjunctivae normal.     Pupils: Pupils are equal, round, and reactive to light.  Neck:     Thyroid : No thyromegaly.     Vascular: No carotid bruit.  Cardiovascular:     Rate and Rhythm: Normal rate and regular rhythm.     Heart sounds: Normal heart sounds. No murmur heard.    No gallop.  Pulmonary:  Effort: Pulmonary effort is normal. No accessory muscle usage or respiratory distress.     Breath sounds: Normal breath sounds.  Abdominal:     General: Bowel sounds are normal. There is no distension.     Palpations: Abdomen is soft.     Tenderness: There is no abdominal tenderness.  Musculoskeletal:     Cervical back: Normal range of motion and neck supple.     Right lower leg: No edema.     Left lower leg: No edema.  Lymphadenopathy:     Cervical: No cervical adenopathy.  Skin:    General: Skin is warm and dry.  Neurological:     Mental Status: She is alert and oriented to person, place, and time.     Deep Tendon Reflexes: Reflexes are normal and symmetric.     Reflex Scores:       Brachioradialis reflexes are 2+ on the right side and 2+ on the left side.      Patellar reflexes are 2+ on the right side and 2+ on the left side. Psychiatric:        Attention and Perception: Attention normal.        Mood and Affect: Mood normal.        Speech: Speech normal.        Behavior: Behavior normal. Behavior is cooperative.        Thought Content: Thought content normal.    Results for orders placed or performed in visit on 12/09/23  Bayer DCA Hb A1c Waived   Collection Time: 12/09/23  9:51 AM  Result Value Ref Range   HB A1C (BAYER DCA - WAIVED) 5.4 4.8 - 5.6 %  CBC with Differential/Platelet   Collection Time: 12/09/23  9:52 AM  Result Value Ref Range   WBC 4.9 3.4 - 10.8 x10E3/uL   RBC 4.77 3.77 - 5.28 x10E6/uL   Hemoglobin 14.1 11.1 - 15.9 g/dL   Hematocrit 55.8 65.9 - 46.6 %   MCV 93 79 - 97 fL   MCH 29.6 26.6 - 33.0 pg   MCHC 32.0 31.5 - 35.7 g/dL   RDW 87.3 88.2 - 84.5 %   Platelets 271 150 - 450 x10E3/uL   Neutrophils 66 Not Estab. %   Lymphs 25 Not Estab. %   Monocytes 8 Not Estab. %   Eos 1 Not Estab. %   Basos 0 Not Estab. %   Neutrophils Absolute 3.2 1.4 - 7.0 x10E3/uL   Lymphocytes Absolute 1.2 0.7 - 3.1 x10E3/uL   Monocytes Absolute 0.4 0.1 - 0.9 x10E3/uL   EOS (ABSOLUTE) 0.1 0.0 - 0.4 x10E3/uL   Basophils Absolute 0.0 0.0 - 0.2 x10E3/uL   Immature Granulocytes 0 Not Estab. %   Immature Grans (Abs) 0.0 0.0 - 0.1 x10E3/uL  Comprehensive metabolic panel with GFR   Collection Time: 12/09/23  9:52 AM  Result Value Ref Range   Glucose 95 70 - 99 mg/dL   BUN 14 8 - 27 mg/dL   Creatinine, Ser 9.04 0.57 - 1.00 mg/dL   eGFR 66 >40 fO/fpw/8.26   BUN/Creatinine Ratio 15 12 - 28   Sodium 143 134 - 144 mmol/L   Potassium 4.4 3.5 - 5.2 mmol/L   Chloride 106 96 - 106 mmol/L   CO2 22 20 - 29 mmol/L   Calcium  10.1 8.7 - 10.3 mg/dL   Total Protein 6.6 6.0 - 8.5 g/dL   Albumin 4.1 3.9 - 4.9 g/dL   Globulin, Total 2.5 1.5 - 4.5 g/dL  Bilirubin Total 0.4  0.0 - 1.2 mg/dL   Alkaline Phosphatase 132 49 - 135 IU/L   AST 19 0 - 40 IU/L   ALT 18 0 - 32 IU/L  Lipid Panel w/o Chol/HDL Ratio   Collection Time: 12/09/23  9:52 AM  Result Value Ref Range   Cholesterol, Total 132 100 - 199 mg/dL   Triglycerides 60 0 - 149 mg/dL   HDL 68 >60 mg/dL   VLDL Cholesterol Cal 13 5 - 40 mg/dL   LDL Chol Calc (NIH) 51 0 - 99 mg/dL  TSH   Collection Time: 12/09/23  9:52 AM  Result Value Ref Range   TSH 1.020 0.450 - 4.500 uIU/mL  VITAMIN D  25 Hydroxy (Vit-D Deficiency, Fractures)   Collection Time: 12/09/23  9:52 AM  Result Value Ref Range   Vit D, 25-Hydroxy 58.8 30.0 - 100.0 ng/mL      Assessment & Plan:   Problem List Items Addressed This Visit       Nervous and Auditory   Otitis media, right - Primary   Acute to right ear. Will start Augmentin BID for 7 days. Recommend no Q tips at home and try to avoid emerging in water until symptoms improved. If any worsening or ongoing then return to office.      Relevant Medications   amoxicillin-clavulanate (AUGMENTIN) 875-125 MG tablet     Follow up plan: Return if symptoms worsen or fail to improve.

## 2024-01-04 ENCOUNTER — Encounter (INDEPENDENT_AMBULATORY_CARE_PROVIDER_SITE_OTHER): Payer: Self-pay | Admitting: Nurse Practitioner

## 2024-01-04 ENCOUNTER — Ambulatory Visit (INDEPENDENT_AMBULATORY_CARE_PROVIDER_SITE_OTHER): Admitting: Nurse Practitioner

## 2024-01-04 VITALS — BP 172/87 | HR 71 | Resp 18 | Wt 247.2 lb

## 2024-01-04 DIAGNOSIS — I890 Lymphedema, not elsewhere classified: Secondary | ICD-10-CM | POA: Diagnosis not present

## 2024-01-04 DIAGNOSIS — I10 Essential (primary) hypertension: Secondary | ICD-10-CM | POA: Diagnosis not present

## 2024-01-04 NOTE — Progress Notes (Signed)
 SUBJECTIVE:  Patient ID: Kristin Solis, female    DOB: [birth year], 68 y.o.   MRN: [id number] Chief Complaint  Patient presents with   New Patient (Initial Visit)    Ref [facility] consult bilateral le lymphedema    Discussed the use of AI scribe software for clinical note transcription with the patient, who gave verbal consent to proceed.  History of Present Illness Kristin Solis is a 68 year old female with lymphedema who presents for evaluation and management. She was referred by Jolene [name],NP for evaluation of her lymphedema.  She has a long-standing history of lymphedema, experiencing episodes blisters, particularly noting a small blister on her right leg. Redness in the area started about a year ago. No history of cellulitis.  She previously used an older lymphedema pump, which ceased functioning about two to three months ago. When operational, she used it three times a day, noting significant improvement in symptoms, including reduced redness and swelling, and improved mobility without limping.  She uses compression socks but finds them uncomfortable due to tightness and irritation. She elevates her legs as part of her management strategy. When using the pump, the patient noted that the redness in her legs would fade away.  In terms of daily activities, she does not travel long distances and typically only drives short distances around her local area. She practices calf exercises while sitting in the car to help manage symptoms.     Past Medical History:  Diagnosis Date   Asthma    Hypertension    Stroke [facility])     Past Surgical History:  Procedure Laterality Date   CYSTOSCOPY/URETEROSCOPY/HOLMIUM LASER/STENT PLACEMENT Right 08/12/2021   Procedure: CYSTOSCOPY/URETEROSCOPY/HOLMIUM LASER/STENT PLACEMENT/LITHOTRIPSY;  Surgeon: Twylla Glendia BROCKS, MD;  Location: [facility] ORS;  Service: Urology;  Laterality: Right;   IR CT HEAD LTD  04/18/2018   IR INTRAVSC STENT CERV CAROTID W/O  EMB-PROT MOD SED INC ANGIO  04/18/2018   IR PERCUTANEOUS ART THROMBECTOMY/INFUSION INTRACRANIAL INC DIAG ANGIO  04/18/2018   OOPHORECTOMY     RADIOLOGY WITH ANESTHESIA N/A 04/17/2018   Procedure: RADIOLOGY WITH ANESTHESIA;  Surgeon: Dolphus Carrion, MD;  Location: [facility] OR;  Service: Radiology;  Laterality: N/A;   TOTAL ABDOMINAL HYSTERECTOMY W/ BILATERAL SALPINGOOPHORECTOMY  2005   total    Social History   Socioeconomic History   Marital status: Married    Spouse name: Not on file   Number of children: Not on file   Years of education: Not on file   Highest education level: Not on file  Occupational History   Not on file  Tobacco Use   Smoking status: Never   Smokeless tobacco: Never  Vaping Use   Vaping status: Never Used  Substance and Sexual Activity   Alcohol use: No   Drug use: No   Sexual activity: Never  Other Topics Concern   Not on file  Social History Narrative   Not on file   Social Drivers of Health   Financial Resource Strain: Low Risk  (12/03/2022)   Overall Financial Resource Strain (CARDIA)    Difficulty of Paying Living Expenses: Not hard at all  Food Insecurity: No Food Insecurity (12/09/2023)   Hunger Vital Sign    Worried About Running Out of Food in the Last Year: Never true    Ran Out of Food in the Last Year: Never true  Transportation Needs: No Transportation Needs (12/09/2023)   PRAPARE - Administrator, Civil Service (Medical): No  Lack of Transportation (Non-Medical): No  Physical Activity: Insufficiently Active (12/09/2023)   Exercise Vital Sign    Days of Exercise per Week: 2 days    Minutes of Exercise per Session: 20 min  Stress: No Stress Concern Present (12/09/2023)   [employer] of Occupational Health - Occupational Stress Questionnaire    Feeling of Stress: Not at all  Social Connections: Socially Isolated (12/09/2023)   Social Connection and Isolation Panel    Frequency of Communication with Friends and  Family: Once a week    Frequency of Social Gatherings with Friends and Family: Never    Attends Religious Services: Never    Database Administrator or Organizations: No    Attends Banker Meetings: Never    Marital Status: Married  Catering Manager Violence: Not At Risk (12/09/2023)   Humiliation, Afraid, Rape, and Kick questionnaire    Fear of Current or Ex-Partner: No    Emotionally Abused: No    Physically Abused: No    Sexually Abused: No    Family History  Problem Relation Age of Onset   Cancer Mother        lung   Cancer Father        brain   Diabetes Sister    Dementia Sister    Heart disease Sister        CHF   Alcohol abuse Brother    Stroke Son        x2   CAD Maternal Grandmother    Emphysema Paternal Grandfather    Breast cancer Neg Hx     No Known Allergies   Review of Systems   Review of Systems: Negative Unless Checked Constitutional: [] Weight loss  [] Fever  [] Chills Cardiac: [] Chest pain   []  Atrial Fibrillation  [] Palpitations   [] Shortness of breath when laying flat   [] Shortness of breath with exertion. [] Shortness of breath at rest Vascular:  [] Pain in legs with walking   [] Pain in legs with standing [] Pain in legs when laying flat   [] Claudication    [] Pain in feet when laying flat    [] History of DVT   [] Phlebitis   [x] Swelling in legs   [] Varicose veins   [] Non-healing ulcers Pulmonary:   [] Uses home oxygen   [] Productive cough   [] Hemoptysis   [] Wheeze  [] COPD   [] Asthma Neurologic:  [] Dizziness   [] Seizures  [] Blackouts [] History of stroke   [] History of TIA  [] Aphasia   [] Temporary Blindness   [] Weakness or numbness in arm   [] Weakness or numbness in leg Musculoskeletal:   [] Joint swelling   [] Joint pain   [] Low back pain  []  History of Knee Replacement [] Arthritis [] back Surgeries  []  Spinal Stenosis    Hematologic:  [] Easy bruising  [] Easy bleeding   [] Hypercoagulable state   [] Anemic Gastrointestinal:  [] Diarrhea   [] Vomiting   [] Gastroesophageal reflux/heartburn   [] Difficulty swallowing. [] Abdominal pain Genitourinary:  [] Chronic kidney disease   [] Difficult urination  [] Anuric   [] Blood in urine [] Frequent urination  [] Burning with urination   [] Hematuria Skin:  [] Rashes   [] Ulcers [] Wounds Psychological:  [] History of anxiety   []  History of major depression  []  Memory Difficulties      OBJECTIVE:     BP (!) 172/87 (BP Location: Right Arm)   Pulse 71   Resp 18   Wt 247 lb 3.2 oz (112.1 kg)   BMI 44.46 kg/m   Physical Exam Vitals reviewed.  HENT:     Head: Normocephalic.  Cardiovascular:     Rate and Rhythm: Normal rate.     Pulses: Normal pulses.  Pulmonary:     Effort: Pulmonary effort is normal.  Musculoskeletal:     Right lower leg: 1+ Edema present.     Left lower leg: 1+ Edema present.  Skin:    General: Skin is warm and dry.     Comments: Bilateral hyperpigmentation   Neurological:     Mental Status: She is alert and oriented to person, place, and time.  Psychiatric:        Mood and Affect: Mood normal.        Behavior: Behavior normal.        Thought Content: Thought content normal.    Physical Exam EXTREMITIES: Legs with some swelling. SKIN: Redness on the skin of the right leg.   CMP     Component Value Date/Time   NA 143 12/09/2023 0952   NA 142 07/14/2012 1014   K 4.4 12/09/2023 0952   K 3.5 07/14/2012 1014   CL 106 12/09/2023 0952   CL 108 (H) 07/14/2012 1014   CO2 22 12/09/2023 0952   CO2 27 07/14/2012 1014   GLUCOSE 95 12/09/2023 0952   GLUCOSE 116 (H) 08/08/2021 0421   GLUCOSE 106 (H) 07/14/2012 1014   BUN 14 12/09/2023 0952   BUN 12 07/14/2012 1014   CREATININE 0.95 12/09/2023 0952   CREATININE 0.94 07/14/2012 1014   CALCIUM  10.1 12/09/2023 0952   CALCIUM  9.0 07/14/2012 1014   PROT 6.6 12/09/2023 0952   PROT 6.9 07/14/2012 1014   ALBUMIN 4.1 12/09/2023 0952   ALBUMIN 3.4 07/14/2012 1014   AST 19 12/09/2023 0952   AST 27 07/14/2012 1014   ALT 18  12/09/2023 0952   ALT 37 07/14/2012 1014   ALKPHOS 132 12/09/2023 0952   ALKPHOS 103 07/14/2012 1014   BILITOT 0.4 12/09/2023 0952   BILITOT 0.3 07/14/2012 1014   EGFR 66 12/09/2023 0952   GFRNONAA >60 08/08/2021 0421   GFRNONAA >60 07/14/2012 1014    No results found.     ASSESSMENT AND PLAN:  1. Lymphedema (Primary) Recommend:  No surgery or intervention at this point in time.   The Patient is CEAP [id number].  The patient has been wearing compression for more than 12 weeks with no or little benefit.  The patient has been exercising daily for more than 12 weeks. The patient has been elevating and taking OTC pain medications for more than 12 weeks.  None of these have have eliminated the pain related to the lymphedema or the discomfort regarding excessive swelling and venous congestion.    I have reviewed my discussion with the patient regarding lymphedema and why it  causes symptoms.  Patient will continue wearing graduated compression on a daily basis. The patient should put the compression on first thing in the morning and removing them in the evening. The patient should not sleep in the compression.   In addition, behavioral modification throughout the day will be continued.  This will include frequent elevation (such as in a recliner), use of over the counter pain medications as needed and exercise such as walking.  The systemic causes for chronic edema such as liver, kidney and cardiac etiologies do not appear to have significant changed over the past year.    The patient has chronic , severe lymphedema with hyperpigmentation of the skin and has done MLD, skin care, medication, diet, exercise, elevation and compression for 4 weeks with no improvement,  I am recommending a lymphedema pump.  The patient still has stage 3 lymphedema and therefore, I believe that a lymph pump is needed to improve the control of the patient's lymphedema and improve the quality of life.  Additionally, a  lymph pump is warranted because it will reduce the risk of cellulitis and ulceration in the future.  Patient should follow-up in six months   2. Essential hypertension, benign Continue antihypertensive medications as already ordered, these medications have been reviewed and there are no changes at this time.      Current Outpatient Medications on File Prior to Visit  Medication Sig Dispense Refill   albuterol  (VENTOLIN  HFA) 108 (90 Base) MCG/ACT inhaler Inhale 2 puffs into the lungs every 6 (six) hours as needed for wheezing or shortness of breath. 18 g 3   amoxicillin-clavulanate (AUGMENTIN) 875-125 MG tablet Take 1 tablet by mouth 2 (two) times daily for 7 days. 14 tablet 0   aspirin  EC 81 MG EC tablet Take 1 tablet (81 mg total) by mouth daily.     atorvastatin  (LIPITOR) 40 MG tablet Take 1 tablet (40 mg total) by mouth daily at 6 PM. 90 tablet 4   fluticasone  (FLONASE ) 50 MCG/ACT nasal spray Use 2 spray(s) in each nostril once daily 16 g 3   fluticasone -salmeterol (ADVAIR ) 250-50 MCG/ACT AEPB Inhale 1 puff into the lungs in the morning and at bedtime. 180 each 4   lisinopril  (ZESTRIL ) 10 MG tablet Take 1 tablet (10 mg total) by mouth daily. 90 tablet 4   No current facility-administered medications on file prior to visit.    There are no Patient Instructions on file for this visit. No follow-ups on file.   Jazmyn Offner E Nikeya Maxim, NP  This note was completed with Office Manager.  Any errors are purely unintentional.

## 2024-06-09 ENCOUNTER — Ambulatory Visit: Admitting: Nurse Practitioner

## 2024-07-03 ENCOUNTER — Ambulatory Visit (INDEPENDENT_AMBULATORY_CARE_PROVIDER_SITE_OTHER): Admitting: Nurse Practitioner
